# Patient Record
Sex: Female | Born: 1960 | Race: White | Hispanic: No | Marital: Married | Smoking: Never smoker
Health system: Southern US, Community
[De-identification: ages and names within clinical notes are randomized; demographics above are authoritative.]

## PROBLEM LIST (undated history)

## (undated) DIAGNOSIS — F419 Anxiety disorder, unspecified: Secondary | ICD-10-CM

## (undated) DIAGNOSIS — R42 Dizziness and giddiness: Secondary | ICD-10-CM

## (undated) DIAGNOSIS — I10 Essential (primary) hypertension: Secondary | ICD-10-CM

## (undated) DIAGNOSIS — E119 Type 2 diabetes mellitus without complications: Secondary | ICD-10-CM

## (undated) DIAGNOSIS — J45909 Unspecified asthma, uncomplicated: Secondary | ICD-10-CM

## (undated) DIAGNOSIS — D649 Anemia, unspecified: Secondary | ICD-10-CM

## (undated) HISTORY — PX: CHOLECYSTECTOMY: SHX55

## (undated) MED FILL — Iron Sucrose Inj 20 MG/ML (Fe Equiv): INTRAVENOUS | Qty: 15 | Status: AC

---

## 2005-04-18 ENCOUNTER — Emergency Department: Payer: Self-pay | Admitting: Emergency Medicine

## 2006-04-16 ENCOUNTER — Emergency Department: Payer: Self-pay | Admitting: Unknown Physician Specialty

## 2006-11-21 ENCOUNTER — Emergency Department: Payer: Self-pay | Admitting: Emergency Medicine

## 2007-07-10 ENCOUNTER — Emergency Department: Payer: Self-pay | Admitting: Emergency Medicine

## 2007-07-10 ENCOUNTER — Other Ambulatory Visit: Payer: Self-pay

## 2008-07-25 ENCOUNTER — Emergency Department: Payer: Self-pay | Admitting: Emergency Medicine

## 2009-10-31 ENCOUNTER — Emergency Department: Payer: Self-pay | Admitting: Emergency Medicine

## 2011-03-18 ENCOUNTER — Emergency Department: Payer: Self-pay | Admitting: Emergency Medicine

## 2013-08-15 HISTORY — PX: CARPAL TUNNEL RELEASE: SHX101

## 2013-12-27 DIAGNOSIS — G5603 Carpal tunnel syndrome, bilateral upper limbs: Secondary | ICD-10-CM | POA: Insufficient documentation

## 2014-05-30 DIAGNOSIS — E669 Obesity, unspecified: Secondary | ICD-10-CM | POA: Insufficient documentation

## 2014-05-30 DIAGNOSIS — R42 Dizziness and giddiness: Secondary | ICD-10-CM | POA: Insufficient documentation

## 2014-05-30 DIAGNOSIS — E785 Hyperlipidemia, unspecified: Secondary | ICD-10-CM | POA: Insufficient documentation

## 2014-05-30 DIAGNOSIS — E119 Type 2 diabetes mellitus without complications: Secondary | ICD-10-CM | POA: Insufficient documentation

## 2014-05-30 DIAGNOSIS — I10 Essential (primary) hypertension: Secondary | ICD-10-CM | POA: Insufficient documentation

## 2014-06-24 ENCOUNTER — Ambulatory Visit: Payer: Self-pay | Admitting: Orthopedic Surgery

## 2014-09-22 ENCOUNTER — Emergency Department: Payer: Self-pay | Admitting: Student

## 2014-11-19 ENCOUNTER — Emergency Department
Admit: 2014-11-19 | Disposition: A | Discharge status: Home / Self Care | Payer: Self-pay | Admitting: Emergency Medicine

## 2014-11-19 LAB — URINALYSIS, COMPLETE
BILIRUBIN, UR: NEGATIVE
Blood: NEGATIVE
GLUCOSE, UR: NEGATIVE mg/dL (ref 0–75)
Ketone: NEGATIVE
Nitrite: NEGATIVE
Ph: 5 (ref 4.5–8.0)
Protein: 100
Specific Gravity: 1.026 (ref 1.003–1.030)
Squamous Epithelial: 31
WBC UR: 29 /HPF (ref 0–5)

## 2014-11-19 LAB — CBC WITH DIFFERENTIAL/PLATELET
Basophil #: 0 10*3/uL (ref 0.0–0.1)
Basophil %: 0.4 %
EOS PCT: 0.1 %
Eosinophil #: 0 10*3/uL (ref 0.0–0.7)
HCT: 33.3 % — ABNORMAL LOW (ref 35.0–47.0)
HGB: 10.1 g/dL — ABNORMAL LOW (ref 12.0–16.0)
LYMPHS ABS: 1.5 10*3/uL (ref 1.0–3.6)
Lymphocyte %: 14 %
MCH: 22.5 pg — ABNORMAL LOW (ref 26.0–34.0)
MCHC: 30.3 g/dL — ABNORMAL LOW (ref 32.0–36.0)
MCV: 74 fL — ABNORMAL LOW (ref 80–100)
Monocyte #: 0.5 x10 3/mm (ref 0.2–0.9)
Monocyte %: 4.5 %
NEUTROS PCT: 81 %
Neutrophil #: 8.8 10*3/uL — ABNORMAL HIGH (ref 1.4–6.5)
PLATELETS: 506 10*3/uL — AB (ref 150–440)
RBC: 4.5 10*6/uL (ref 3.80–5.20)
RDW: 16.8 % — ABNORMAL HIGH (ref 11.5–14.5)
WBC: 10.9 10*3/uL (ref 3.6–11.0)

## 2014-11-19 LAB — COMPREHENSIVE METABOLIC PANEL
ALK PHOS: 91 U/L
Albumin: 3.9 g/dL
Anion Gap: 8 (ref 7–16)
BUN: 9 mg/dL
Bilirubin,Total: 0.4 mg/dL
Calcium, Total: 9.4 mg/dL
Chloride: 104 mmol/L
Co2: 27 mmol/L
Creatinine: 0.59 mg/dL
EGFR (Non-African Amer.): >60
Glucose: 165 mg/dL — ABNORMAL HIGH
Potassium: 3.8 mmol/L
SGOT(AST): 15 U/L
SGPT (ALT): 16 U/L
Sodium: 139 mmol/L
TOTAL PROTEIN: 8.1 g/dL

## 2014-11-19 LAB — TROPONIN I: Troponin-I: <0.03 ng/mL

## 2014-11-19 LAB — LIPASE, BLOOD: Lipase: 19 U/L — ABNORMAL LOW

## 2014-11-21 LAB — URINE CULTURE

## 2014-12-26 ENCOUNTER — Other Ambulatory Visit: Payer: Self-pay | Admitting: Neurosurgery

## 2014-12-26 DIAGNOSIS — M4300 Spondylolysis, site unspecified: Secondary | ICD-10-CM

## 2015-01-02 ENCOUNTER — Ambulatory Visit
Admission: RE | Admit: 2015-01-02 | Discharge: 2015-01-02 | Disposition: A | Discharge status: Home / Self Care | Payer: 59 | Source: Ambulatory Visit | Attending: Neurosurgery | Admitting: Neurosurgery

## 2015-01-02 DIAGNOSIS — M4300 Spondylolysis, site unspecified: Secondary | ICD-10-CM

## 2015-01-22 ENCOUNTER — Emergency Department
Admission: EM | Admit: 2015-01-22 | Discharge: 2015-01-22 | Disposition: A | Discharge status: Home / Self Care | Payer: 59 | Attending: Emergency Medicine | Admitting: Emergency Medicine

## 2015-01-22 ENCOUNTER — Encounter: Payer: Self-pay | Admitting: Emergency Medicine

## 2015-01-22 DIAGNOSIS — B370 Candidal stomatitis: Secondary | ICD-10-CM | POA: Insufficient documentation

## 2015-01-22 DIAGNOSIS — J029 Acute pharyngitis, unspecified: Secondary | ICD-10-CM | POA: Diagnosis present

## 2015-01-22 MED ORDER — NYSTATIN 100000 UNIT/ML MT SUSP: 5.0000 mL | Freq: Four times a day (QID) | OROMUCOSAL | Status: DC

## 2015-01-22 MED ORDER — FLUCONAZOLE 150 MG PO TABS: 150.0000 mg | ORAL_TABLET | Freq: Every day | ORAL | Status: DC

## 2015-01-22 NOTE — ED Provider Notes (Signed)
[facility] Emergency Department Provider Note  ____________________________________________  Time seen: 12:21 I have reviewed the triage vital signs and the nursing notes.   HISTORY  Chief Complaint Sore Throat   HPI Lauren Velazquez is a 54 y.o. female is here today with complaint of sore throat. She states she does not feel that this is strep throat. She is extremely uncomfortable with eating or breathing. There is been no fever.Patient denies any exposure to strep throat. She did have a steriod injection for her back approximately 2 weeks ago. She states this feels more like thrush. Only her pain is 3 out of 10. Eating or drinking makes this worse. She has not found anything that makes it better.   History reviewed. No pertinent past medical history.  There are no active problems to display for this patient.   No past surgical history on file.  Current Outpatient Rx  Name  Route  Sig  Dispense  Refill  . fluconazole (DIFLUCAN) 150 MG tablet   Oral   Take 1 tablet (150 mg total) by mouth daily.   1 tablet   0   . nystatin (MYCOSTATIN) 100000 UNIT/ML suspension   Oral   Take 5 mLs (500,000 Units total) by mouth 4 (four) times daily.   60 mL   0     Allergies Review of patient's allergies indicates no known allergies.  No family history on file.  Social History History  Substance Use Topics  . Smoking status: Never Smoker   . Smokeless tobacco: Not on file  . Alcohol Use: No    Review of Systems Constitutional: No fever/chills Eyes: No visual changes. ENT: No sore throat. Cardiovascular: Denies chest pain. Respiratory: Denies shortness of breath. Gastrointestinal: No abdominal pain.  No nausea, no vomiting. Genitourinary: Negative for dysuria. Musculoskeletal: Negative for back pain. Skin: Negative for rash. Neurological: Negative for headaches  10-point ROS otherwise  negative.  ____________________________________________   PHYSICAL EXAM:  VITAL SIGNS: ED Triage Vitals  Enc Vitals Group     BP 01/22/15 1154 152/78 mmHg     Pulse Rate 01/22/15 1154 89     Resp 01/22/15 1154 16     Temp 01/22/15 1154 98.8 F (37.1 C)     Temp Source 01/22/15 1154 Oral     SpO2 01/22/15 1154 100 %     Weight 01/22/15 1154 220 lb (99.791 kg)     Height 01/22/15 1154 5\' 3"  (1.6 m)     Head Cir --      Peak Flow --      Pain Score 01/22/15 1155 3     Pain Loc --      Pain Edu? --      Excl. in GC? --     Constitutional: Alert and oriented. Well appearing and in no acute distress. Eyes: Conjunctivae are normal. PERRL. EOMI. Head: Atraumatic. Nose: No congestion/rhinnorhea. Mouth/Throat: Mucous membranes are moist.  Oropharynx non-erythematous. There is diffuse small scattered patches of white exudate along the buccal mucosa and on the tongue. Neck: No stridor.   Hematological/Lymphatic/Immunilogical: No cervical lymphadenopathy. Cardiovascular: Normal rate, regular rhythm. Grossly normal heart sounds.  Good peripheral circulation. Respiratory: Normal respiratory effort.  No retractions. Lungs CTAB. Gastrointestinal: Soft and nontender. No distention. Musculoskeletal: No lower extremity tenderness nor edema.  No joint effusions. Neurologic:  Normal speech and language. No gross focal neurologic deficits are appreciated. Speech is normal. No gait instability. Skin:  Skin is warm, dry and intact. No rash  noted. Psychiatric: Mood and affect are normal. Speech and behavior are normal.  ____________________________________________   LABS (all labs ordered are listed, but only abnormal results are displayed)  Labs Reviewed - No data to display ____________________________________________  PROCEDURES  Procedure(s) performed: None  Critical Care performed: No  ____________________________________________   INITIAL IMPRESSION / ASSESSMENT AND PLAN / ED  COURSE  Pertinent labs & imaging results that were available during my care of the patient were reviewed by me and considered in my medical decision making (see chart for details).  She was given both oral Diflucan and nystatin. She is to follow-up with her doctor if any continued problems. ____________________________________________   FINAL CLINICAL IMPRESSION(S) / ED DIAGNOSES  Final diagnoses:  Oral thrush      Tommi Rumps, PA-C 01/22/15 1549  Phineas Semen, MD 01/26/15 1012

## 2015-01-22 NOTE — ED Notes (Signed)
Says throat sore especially with eating or breathing, scratchy.  No fever

## 2017-05-25 ENCOUNTER — Inpatient Hospital Stay: Admission: RE | Admit: 2017-05-25 | Payer: Self-pay | Source: Ambulatory Visit

## 2017-05-29 ENCOUNTER — Inpatient Hospital Stay: Admission: RE | Admit: 2017-05-29 | Payer: Self-pay | Source: Ambulatory Visit

## 2017-05-29 HISTORY — DX: Anxiety disorder, unspecified: F41.9

## 2017-05-29 HISTORY — DX: Type 2 diabetes mellitus without complications: E11.9

## 2017-05-29 HISTORY — DX: Dizziness and giddiness: R42

## 2017-05-29 HISTORY — DX: Essential (primary) hypertension: I10

## 2017-05-30 ENCOUNTER — Inpatient Hospital Stay: Admission: RE | Admit: 2017-05-30 | Payer: Self-pay | Source: Ambulatory Visit

## 2017-05-31 ENCOUNTER — Inpatient Hospital Stay
Admission: RE | Admit: 2017-05-31 | Discharge: 2017-05-31 | Disposition: A | Discharge status: Home / Self Care | Payer: Self-pay | Source: Ambulatory Visit

## 2017-05-31 MED ORDER — CEFAZOLIN SODIUM-DEXTROSE 2-4 GM/100ML-% IV SOLN
2.0000 g | Freq: Once | INTRAVENOUS | Status: AC
  Administered 2017-06-01: 2 g via INTRAVENOUS

## 2017-05-31 NOTE — Pre-Procedure Instructions (Signed)
CALLED CASEY AT DR [facility] OFFICE AND LEFT HER 2 MESSAGES REGARDING PT NEVER RETURNING MY PHONE CALL FOR HER ANESTHESIA PREOP.  I INFORMED CASEY THAT SHE MAY NEED TO TRY AND GET IN TOUCH WITH PT TO SEE IF SHE IS EVEN GOING TO SHOW UP FOR HER SURGERY.  I TOLD CASEY THAT I HAVE NOT GIVEN PT ANY INSTRUCTIONS  SINCE SHE HAS NEVER RETURNED MY CALL

## 2017-06-01 ENCOUNTER — Ambulatory Visit: Payer: BLUE CROSS/BLUE SHIELD | Admitting: Anesthesiology

## 2017-06-01 ENCOUNTER — Encounter
Admission: RE | Disposition: A | Discharge status: Home / Self Care | Payer: Self-pay | Source: Ambulatory Visit | Attending: Orthopedic Surgery

## 2017-06-01 ENCOUNTER — Ambulatory Visit
Admission: RE | Admit: 2017-06-01 | Discharge: 2017-06-01 | Disposition: A | Discharge status: Home / Self Care | Payer: BLUE CROSS/BLUE SHIELD | Source: Ambulatory Visit | Attending: Orthopedic Surgery | Admitting: Orthopedic Surgery

## 2017-06-01 ENCOUNTER — Encounter: Payer: Self-pay | Admitting: Anesthesiology

## 2017-06-01 DIAGNOSIS — E785 Hyperlipidemia, unspecified: Secondary | ICD-10-CM | POA: Insufficient documentation

## 2017-06-01 DIAGNOSIS — I10 Essential (primary) hypertension: Secondary | ICD-10-CM | POA: Diagnosis not present

## 2017-06-01 DIAGNOSIS — Z79899 Other long term (current) drug therapy: Secondary | ICD-10-CM | POA: Diagnosis not present

## 2017-06-01 DIAGNOSIS — E669 Obesity, unspecified: Secondary | ICD-10-CM | POA: Insufficient documentation

## 2017-06-01 DIAGNOSIS — Z794 Long term (current) use of insulin: Secondary | ICD-10-CM | POA: Insufficient documentation

## 2017-06-01 DIAGNOSIS — Z79891 Long term (current) use of opiate analgesic: Secondary | ICD-10-CM | POA: Diagnosis not present

## 2017-06-01 DIAGNOSIS — E119 Type 2 diabetes mellitus without complications: Secondary | ICD-10-CM | POA: Insufficient documentation

## 2017-06-01 DIAGNOSIS — M9251 Juvenile osteochondrosis of tibia and fibula, right leg: Secondary | ICD-10-CM | POA: Insufficient documentation

## 2017-06-01 DIAGNOSIS — F419 Anxiety disorder, unspecified: Secondary | ICD-10-CM | POA: Diagnosis not present

## 2017-06-01 DIAGNOSIS — M899 Disorder of bone, unspecified: Secondary | ICD-10-CM | POA: Diagnosis present

## 2017-06-01 DIAGNOSIS — Z6838 Body mass index (BMI) 38.0-38.9, adult: Secondary | ICD-10-CM | POA: Insufficient documentation

## 2017-06-01 HISTORY — PX: TIBIAL TUBERCLERPLASTY: SHX6531

## 2017-06-01 HISTORY — DX: Anemia, unspecified: D64.9

## 2017-06-01 LAB — BASIC METABOLIC PANEL
ANION GAP: 10 (ref 5–15)
BUN: 10 mg/dL (ref 6–20)
CO2: 27 mmol/L (ref 22–32)
Calcium: 9.1 mg/dL (ref 8.9–10.3)
Chloride: 103 mmol/L (ref 101–111)
Creatinine, Ser: 0.58 mg/dL (ref 0.44–1.00)
GFR calc Af Amer: >60 mL/min (ref >60)
GFR calc non Af Amer: >60 mL/min (ref >60)
Glucose, Bld: 99 mg/dL (ref 65–99)
POTASSIUM: 3.9 mmol/L (ref 3.5–5.1)
Sodium: 140 mmol/L (ref 135–145)

## 2017-06-01 LAB — GLUCOSE, CAPILLARY
GLUCOSE-CAPILLARY: 103 mg/dL — AB (ref 65–99)
Glucose-Capillary: 88 mg/dL (ref 65–99)

## 2017-06-01 LAB — CBC
HCT: 37.6 % (ref 35.0–47.0)
Hemoglobin: 11.7 g/dL — ABNORMAL LOW (ref 12.0–16.0)
MCH: 23 pg — ABNORMAL LOW (ref 26.0–34.0)
MCHC: 31.1 g/dL — ABNORMAL LOW (ref 32.0–36.0)
MCV: 73.9 fL — ABNORMAL LOW (ref 80.0–100.0)
Platelets: 330 10*3/uL (ref 150–440)
RBC: 5.09 MIL/uL (ref 3.80–5.20)
RDW: 16.9 % — ABNORMAL HIGH (ref 11.5–14.5)
WBC: 9.5 10*3/uL (ref 3.6–11.0)

## 2017-06-01 SURGERY — TIBIAL TUBERCLERPLASTY
Anesthesia: General | Site: Knee | Laterality: Bilateral | Wound class: Clean

## 2017-06-01 MED ORDER — ACETAMINOPHEN 10 MG/ML IV SOLN
INTRAVENOUS | Status: AC
  Filled 2017-06-01: qty 100

## 2017-06-01 MED ORDER — HYDROMORPHONE HCL 1 MG/ML IJ SOLN
0.5000 mg | INTRAMUSCULAR | Status: AC | PRN
  Administered 2017-06-01 (×4): 0.5 mg via INTRAVENOUS

## 2017-06-01 MED ORDER — FENTANYL CITRATE (PF) 100 MCG/2ML IJ SOLN
25.0000 ug | INTRAMUSCULAR | Status: AC | PRN
  Administered 2017-06-01 (×6): 25 ug via INTRAVENOUS

## 2017-06-01 MED ORDER — SODIUM CHLORIDE 0.9 % IV SOLN
INTRAVENOUS | Status: DC
  Administered 2017-06-01: 100 mL/h via INTRAVENOUS

## 2017-06-01 MED ORDER — GLYCOPYRROLATE 0.2 MG/ML IJ SOLN
INTRAMUSCULAR | Status: AC
  Filled 2017-06-01: qty 1

## 2017-06-01 MED ORDER — DEXAMETHASONE SODIUM PHOSPHATE 10 MG/ML IJ SOLN
INTRAMUSCULAR | Status: AC
  Filled 2017-06-01: qty 1

## 2017-06-01 MED ORDER — DEXAMETHASONE SODIUM PHOSPHATE 10 MG/ML IJ SOLN
INTRAMUSCULAR | Status: DC | PRN
  Administered 2017-06-01: 5 mg via INTRAVENOUS

## 2017-06-01 MED ORDER — FENTANYL CITRATE (PF) 100 MCG/2ML IJ SOLN
INTRAMUSCULAR | Status: DC | PRN
  Administered 2017-06-01: 100 ug via INTRAVENOUS

## 2017-06-01 MED ORDER — HYDROCODONE-ACETAMINOPHEN 5-325 MG PO TABS
1.0000 | ORAL_TABLET | Freq: Four times a day (QID) | ORAL | Status: DC | PRN
  Administered 2017-06-01: 1 via ORAL

## 2017-06-01 MED ORDER — ONDANSETRON HCL 4 MG/2ML IJ SOLN: 4.0000 mg | Freq: Once | INTRAMUSCULAR | Status: DC | PRN

## 2017-06-01 MED ORDER — ACETAMINOPHEN 10 MG/ML IV SOLN
INTRAVENOUS | Status: DC | PRN
  Administered 2017-06-01: 1000 mg via INTRAVENOUS

## 2017-06-01 MED ORDER — PROPOFOL 10 MG/ML IV BOLUS
INTRAVENOUS | Status: AC
  Filled 2017-06-01: qty 20

## 2017-06-01 MED ORDER — MIDAZOLAM HCL 2 MG/2ML IJ SOLN
INTRAMUSCULAR | Status: AC
  Filled 2017-06-01: qty 2

## 2017-06-01 MED ORDER — MIDAZOLAM HCL 2 MG/2ML IJ SOLN
INTRAMUSCULAR | Status: DC | PRN
  Administered 2017-06-01: 2 mg via INTRAVENOUS

## 2017-06-01 MED ORDER — KETOROLAC TROMETHAMINE 30 MG/ML IJ SOLN
INTRAMUSCULAR | Status: AC
  Filled 2017-06-01: qty 1

## 2017-06-01 MED ORDER — PROPOFOL 10 MG/ML IV BOLUS
INTRAVENOUS | Status: DC | PRN
  Administered 2017-06-01: 150 mg via INTRAVENOUS

## 2017-06-01 MED ORDER — HYDROMORPHONE HCL 1 MG/ML IJ SOLN
INTRAMUSCULAR | Status: AC
  Administered 2017-06-01: 0.5 mg via INTRAVENOUS
  Filled 2017-06-01: qty 1

## 2017-06-01 MED ORDER — FENTANYL CITRATE (PF) 100 MCG/2ML IJ SOLN
INTRAMUSCULAR | Status: AC
  Administered 2017-06-01: 25 ug via INTRAVENOUS
  Filled 2017-06-01: qty 2

## 2017-06-01 MED ORDER — ONDANSETRON HCL 4 MG/2ML IJ SOLN
INTRAMUSCULAR | Status: AC
  Filled 2017-06-01: qty 2

## 2017-06-01 MED ORDER — PHENYLEPHRINE HCL 10 MG/ML IJ SOLN
INTRAMUSCULAR | Status: DC | PRN
  Administered 2017-06-01 (×3): 100 ug via INTRAVENOUS

## 2017-06-01 MED ORDER — FENTANYL CITRATE (PF) 100 MCG/2ML IJ SOLN
INTRAMUSCULAR | Status: AC
  Filled 2017-06-01: qty 2

## 2017-06-01 MED ORDER — CEFAZOLIN SODIUM-DEXTROSE 2-4 GM/100ML-% IV SOLN
INTRAVENOUS | Status: AC
  Filled 2017-06-01: qty 100

## 2017-06-01 MED ORDER — GLYCOPYRROLATE 0.2 MG/ML IJ SOLN
INTRAMUSCULAR | Status: DC | PRN
  Administered 2017-06-01: 0.2 mg via INTRAVENOUS

## 2017-06-01 MED ORDER — LIDOCAINE HCL (CARDIAC) 20 MG/ML IV SOLN
INTRAVENOUS | Status: DC | PRN
  Administered 2017-06-01: 100 mg via INTRAVENOUS

## 2017-06-01 MED ORDER — FAMOTIDINE 20 MG PO TABS
20.0000 mg | ORAL_TABLET | Freq: Once | ORAL | Status: AC
  Administered 2017-06-01: 20 mg via ORAL

## 2017-06-01 MED ORDER — KETOROLAC TROMETHAMINE 30 MG/ML IJ SOLN
INTRAMUSCULAR | Status: DC | PRN
  Administered 2017-06-01: 30 mg via INTRAVENOUS

## 2017-06-01 MED ORDER — ONDANSETRON HCL 4 MG/2ML IJ SOLN
INTRAMUSCULAR | Status: DC | PRN
Start: 2017-06-01 — End: 2017-06-01
  Administered 2017-06-01: 4 mg via INTRAVENOUS

## 2017-06-01 MED ORDER — HYDROCODONE-ACETAMINOPHEN 5-325 MG PO TABS
ORAL_TABLET | ORAL | Status: AC
  Filled 2017-06-01: qty 1

## 2017-06-01 MED ORDER — FENTANYL CITRATE (PF) 100 MCG/2ML IJ SOLN
25.0000 ug | INTRAMUSCULAR | Status: AC | PRN
  Administered 2017-06-01 (×2): 25 ug via INTRAVENOUS

## 2017-06-01 MED ORDER — HYDROCODONE-ACETAMINOPHEN 5-325 MG PO TABS: 1.0000 | ORAL_TABLET | Freq: Four times a day (QID) | ORAL | 0 refills | Status: DC | PRN

## 2017-06-01 MED ORDER — FAMOTIDINE 20 MG PO TABS
ORAL_TABLET | ORAL | Status: AC
  Administered 2017-06-01: 20 mg via ORAL
  Filled 2017-06-01: qty 1

## 2017-06-01 SURGICAL SUPPLY — 32 items
BANDAGE ACE 6X5 VEL STRL LF (GAUZE/BANDAGES/DRESSINGS) IMPLANT
BLADE INCISOR PLUS 4.5 (BLADE) IMPLANT
BLADE SAW 1/2 (BLADE) ×3 IMPLANT
BLADE SURG 15 STRL LF DISP TIS (BLADE) ×1 IMPLANT
BLADE SURG 15 STRL SS (BLADE) ×2
CHLORAPREP W/TINT 26ML (MISCELLANEOUS) ×3 IMPLANT
COOLER POLAR GLACIER W/PUMP (MISCELLANEOUS) ×3 IMPLANT
CUFF TOURN 24 STER (MISCELLANEOUS) IMPLANT
CUFF TOURN 30 STER DUAL PORT (MISCELLANEOUS) IMPLANT
ELECT CAUTERY BLADE 6.4 (BLADE) ×3 IMPLANT
GAUZE SPONGE 4X4 12PLY STRL (GAUZE/BANDAGES/DRESSINGS) ×3 IMPLANT
GLOVE SURG SYN 9.0  PF PI (GLOVE) ×2
GLOVE SURG SYN 9.0 PF PI (GLOVE) ×1 IMPLANT
GOWN SRG 2XL LVL 4 RGLN SLV (GOWNS) ×1 IMPLANT
GOWN STRL NON-REIN 2XL LVL4 (GOWNS) ×2
GOWN STRL REUS W/ TWL LRG LVL3 (GOWN DISPOSABLE) ×2 IMPLANT
GOWN STRL REUS W/TWL LRG LVL3 (GOWN DISPOSABLE) ×4
IV LACTATED RINGER IRRG 3000ML (IV SOLUTION) ×4
IV LR IRRIG 3000ML ARTHROMATIC (IV SOLUTION) ×2 IMPLANT
KIT RM TURNOVER STRD PROC AR (KITS) ×3 IMPLANT
MANIFOLD NEPTUNE II (INSTRUMENTS) ×3 IMPLANT
PACK EXTREMITY ARMC (MISCELLANEOUS) ×3 IMPLANT
PAD WRAPON POLAR KNEE (MISCELLANEOUS) ×1 IMPLANT
SET TUBE SUCT SHAVER OUTFL 24K (TUBING) ×3 IMPLANT
SET TUBE TIP INTRA-ARTICULAR (MISCELLANEOUS) ×3 IMPLANT
SUT ETHILON 4-0 (SUTURE) ×2
SUT ETHILON 4-0 FS2 18XMFL BLK (SUTURE) ×1
SUT VIC AB 1 CT1 36 (SUTURE) ×3 IMPLANT
SUTURE ETHLN 4-0 FS2 18XMF BLK (SUTURE) ×1 IMPLANT
TUBING ARTHRO INFLOW-ONLY STRL (TUBING) ×3 IMPLANT
WAND COBLATION FLOW 50 (SURGICAL WAND) ×3 IMPLANT
WRAPON POLAR PAD KNEE (MISCELLANEOUS) ×3

## 2017-06-01 NOTE — Anesthesia Preprocedure Evaluation (Addendum)
Anesthesia Evaluation  Patient identified by MRN, date of birth, ID band Patient awake    Reviewed: Allergy & Precautions, NPO status , Patient's Chart, lab work & pertinent test results, reviewed documented beta blocker date and time   Airway Mallampati: III  TM Distance: >3 FB     Dental  (+) Chipped, Upper Dentures, Missing   Pulmonary           Cardiovascular hypertension, Pt. on medications      Neuro/Psych Anxiety    GI/Hepatic   Endo/Other  diabetes, Type 2  Renal/GU      Musculoskeletal   Abdominal   Peds  Hematology   Anesthesia Other Findings Obese.  Reproductive/Obstetrics                            Anesthesia Physical Anesthesia Plan  ASA: III  Anesthesia Plan: General   Post-op Pain Management:    Induction: Intravenous  PONV Risk Score and Plan:   Airway Management Planned: LMA  Additional Equipment:   Intra-op Plan:   Post-operative Plan:   Informed Consent: I have reviewed the patients History and Physical, chart, labs and discussed the procedure including the risks, benefits and alternatives for the proposed anesthesia with the patient or authorized representative who has indicated his/her understanding and acceptance.     Plan Discussed with: CRNA  Anesthesia Plan Comments:         Anesthesia Quick Evaluation

## 2017-06-01 NOTE — Op Note (Signed)
06/01/2017  11:44 AM  PATIENT:  Lauren Velazquez  56 y.o. female  PRE-OPERATIVE DIAGNOSIS:  Bilateral tibial tubercle exostosis  POST-OPERATIVE DIAGNOSIS:  Same   PROCEDURE:  Procedure(s): TIBIAL TUBERCLE SPUR EXCISION (Bilateral)  SURGEON: Leitha Schuller, MD  ASSISTANTS: None  ANESTHESIA:   general  EBL:  Total I/O In: 700 [I.V.:700] Out: 5 [Blood:5]  BLOOD ADMINISTERED:none  DRAINS: none   LOCAL MEDICATIONS USED:  NONE  SPECIMEN:  No Specimen  DISPOSITION OF SPECIMEN:  N/A  COUNTS:  YES  TOURNIQUET:   14 minutes each leg  IMPLANTS: None  DICTATION: .Dragon Dictation patient brought the operating room and after general anesthesia was obtained both legs were prepped draped in sterile fashion. After patient identification and timeout procedures were completed, left tourniquet was raised and the upper thigh to 300 mmHg and a incision was made directly over the anterior tibial spur. The tendon was then elevated off the spur medial and lateral such that the spur could be separated and an osteotome used to remove it. A file was then used to smooth the edges so there is not a sharp edge of bone. The proximal portion was then covered with bone wax to prevent regrowth and minimize bleeding. The tendon was then oversewn over the defect created. This is no 2-0 Vicryl followed by 4-0 nylon for the skin application of sterile dressings of Xeroform 4 x 4 web roll and Ace wrap identical procedures carried out on the right  PLAN OF CARE: Discharge to home after PACU  PATIENT DISPOSITION:  PACU - hemodynamically stable.

## 2017-06-01 NOTE — H&P (Signed)
Reviewed paper H+P, will be scanned into chart. No changes noted.  

## 2017-06-01 NOTE — Anesthesia Post-op Follow-up Note (Signed)
Anesthesia QCDR form completed.        

## 2017-06-01 NOTE — Transfer of Care (Signed)
Immediate Anesthesia Transfer of Care Note  Patient: Lauren Velazquez  Procedure(s) Performed: TIBIAL TUBERCLE SPUR EXCISION (Bilateral Knee)  Patient Location: PACU  Anesthesia Type:General  Level of Consciousness: sedated  Airway & Oxygen Therapy: Patient Spontanous Breathing and Patient connected to face mask oxygen  Post-op Assessment: Report given to RN and Post -op Vital signs reviewed and stable  Post vital signs: Reviewed and stable  Last Vitals:  Vitals:   06/01/17 0928  BP: (!) 156/88  Pulse: 81  Resp: 16  Temp: 36.9 C  SpO2: 97%    Last Pain:  Vitals:   06/01/17 0928  TempSrc: Oral  PainSc: 5       Patients Stated Pain Goal: 1 (06/01/17 5784)  Complications: No apparent anesthesia complications

## 2017-06-01 NOTE — H&P (Signed)
Formatting of this note might be different from the original. Chief Complaint  Patient presents with  . Knee Pain  H&P bil tibial turb spur sx 06/01/17   History of Present Illness: Lauren Velazquez is a 56 y.o. female who presents to the clinic today for history and physical for excision of bilateral knee tibial tuberosities. Patient has had moderate bilateral knee pain for years. She has pain at nighttime that will awaken her. She is unable to kneel. Patient has tried conservative treatment with anti-inflammatory medications as well as physical therapy with no improvement. Pain interferes with her activities of daily living. X-rays show osteophytes along the tibial tubercle which is the location of patient's pain.  Past Medical History: Past Medical History:  Diagnosis Date  . Anxiety attack  . Diabetes mellitus type 2, uncomplicated (CMS-HCC)  . Hyperlipidemia, unspecified  . Hypertension  . Obesity, unspecified  . Vertigo   Past Surgical History: Past Surgical History:  Procedure Laterality Date  . Carpal Tunnel Release Right 07/18/14  Dr. Rosita KeaMenz  . CESAREAN SECTION  two   Past Family History: Family History  Problem Relation Age of Onset  . Diabetes type II Father  . High blood pressure (Hypertension) Father   Medications: Current Outpatient Prescriptions Ordered in Epic  Medication Sig Dispense Refill  . gabapentin (NEURONTIN) 600 MG tablet Take 1 tablet by mouth 3 (three) times daily. 3  . insulin GLARGINE (LANTUS SOLOSTAR) 100 unit/mL pen injector Inject 70 Units subcutaneously 2 (two) times daily.  [name]. lisinopril (PRINIVIL,ZESTRIL) 40 MG tablet Take 1 tablet by mouth once daily. 3  . NOVOLOG FLEXPEN 100 unit/mL pen injector 20 Units 3 (three) times daily. 0  . oxyCODONE (OXYCONTIN) 10 MG CR tablet Take 10 mg by mouth every 12 (twelve) hours.  . traZODone (DESYREL) 150 MG tablet Take 150 mg by mouth nightly.  . venlafaxine (EFFEXOR-XR) 75 MG XR capsule Take 1 capsule by  mouth once daily. 0   No current Epic-ordered facility-administered medications on file.   Allergies: Allergies  Allergen Reactions  . Lipitor [Atorvastatin] Swelling    Body mass index is 38.74 kg/m.  Review of Systems:  A comprehensive 14 point ROS was performed, reviewed, and the pertinent orthopaedic findings are documented in the HPI. Physical Exam: General:  Well developed, well nourished, no apparent distress, normal affect, normal gait with no antalgic component.   HEENT: Head normocephalic, atraumatic, PERRL.   Abdomen: Soft, non tender, non distended, Bowel sounds present.  Heart: Examination of the heart reveals regular, rate, and rhythm. There is no murmur noted on ascultation. There is a normal apical pulse.  Lungs: Lungs are clear to auscultation. There is no wheeze, rhonchi, or crackles. There is normal expansion of bilateral chest walls.  Vitals:  05/26/17 1501  BP: 130/88   Orthopaedic Examination: Right Left   Gait Normal  Alignment Normal  Inspection Normal Normal  Palpation Knee 2 cm x 1.5 cm bony knot at the tibial tendon insertion. Very tender with overlying bursitis. 2 cm x 1.5 cm bony knot at the tibial tendon insertion.  Range of Motion Knee Normal Normal  Strength Normal Normal  Meniscus Exam Normal Normal  Ligament Exam Normal Normal  Patella Exam Normal Normal  Reflexes Normal Normal  Neurologic Normal Normal   Imaging Studies: No new imaging studies were obtained or reviewed today.  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 Risks, benefits, complications of bilateral knee tibial tuberosity excisions were discussed with patient. Patient has agreed and consented to procedure with Dr. Rosita Kea on 06/01/2017.

## 2017-06-01 NOTE — Anesthesia Postprocedure Evaluation (Signed)
Anesthesia Post Note  Patient: Lauren Velazquez  Procedure(s) Performed: TIBIAL TUBERCLE SPUR EXCISION (Bilateral Knee)  Patient location during evaluation: PACU Anesthesia Type: General Level of consciousness: awake and alert Pain management: pain level controlled Vital Signs Assessment: post-procedure vital signs reviewed and stable Respiratory status: spontaneous breathing, nonlabored ventilation, respiratory function stable and patient connected to nasal cannula oxygen Cardiovascular status: blood pressure returned to baseline and stable Postop Assessment: no apparent nausea or vomiting Anesthetic complications: no     Last Vitals:  Vitals:   06/01/17 1312 06/01/17 1325  BP: 130/90 134/68  Pulse: 84 87  Resp: 12   Temp: 36.8 C 36.8 C  SpO2: 93% (!) 86%    Last Pain:  Vitals:   06/01/17 1423  TempSrc:   PainSc: 6                  Shadiamond Koska S

## 2017-06-01 NOTE — Anesthesia Procedure Notes (Signed)
Procedure Name: LMA Insertion Date/Time: 06/01/2017 10:57 AM Performed by: Junious Silk Pre-anesthesia Checklist: Patient identified, Patient being monitored, Timeout performed, Emergency Drugs available and Suction available Patient Re-evaluated:Patient Re-evaluated prior to induction Oxygen Delivery Method: Circle system utilized Preoxygenation: Pre-oxygenation with 100% oxygen Induction Type: IV induction Ventilation: Mask ventilation without difficulty LMA: LMA inserted LMA Size: 3.5 Tube type: Oral Number of attempts: 1 Placement Confirmation: positive ETCO2 and breath sounds checked- equal and bilateral Tube secured with: Tape Dental Injury: Teeth and Oropharynx as per pre-operative assessment

## 2017-06-01 NOTE — Discharge Instructions (Signed)
Keep legs elevated. Aspirin 325 mg daily to help prevent blood clots. Work ankles up and down to help prevent blood clots as well. Leave dressings in place clean and dry. Pain medicine as directed

## 2017-06-04 ENCOUNTER — Emergency Department
Admission: EM | Admit: 2017-06-04 | Discharge: 2017-06-04 | Disposition: A | Discharge status: Home / Self Care | Payer: BLUE CROSS/BLUE SHIELD | Attending: Emergency Medicine | Admitting: Emergency Medicine

## 2017-06-04 DIAGNOSIS — M25562 Pain in left knee: Secondary | ICD-10-CM | POA: Insufficient documentation

## 2017-06-04 DIAGNOSIS — M25561 Pain in right knee: Secondary | ICD-10-CM | POA: Diagnosis present

## 2017-06-04 DIAGNOSIS — Z5321 Procedure and treatment not carried out due to patient leaving prior to being seen by health care provider: Secondary | ICD-10-CM | POA: Insufficient documentation

## 2017-06-04 NOTE — ED Triage Notes (Signed)
Patient reports having surgery on both knees Thursday.  Patient reports swelling and pain to knees.

## 2017-06-05 ENCOUNTER — Telehealth: Payer: Self-pay | Admitting: Emergency Medicine

## 2017-06-05 NOTE — Telephone Encounter (Signed)
Called patient due to lwot to inquire about condition and follow up plans. Left message.   

## 2017-12-19 ENCOUNTER — Inpatient Hospital Stay: Admission: RE | Admit: 2017-12-19 | Payer: BLUE CROSS/BLUE SHIELD | Source: Ambulatory Visit

## 2017-12-22 ENCOUNTER — Encounter
Admission: RE | Admit: 2017-12-22 | Discharge: 2017-12-22 | Disposition: A | Discharge status: Home / Self Care | Payer: BLUE CROSS/BLUE SHIELD | Source: Ambulatory Visit | Attending: Orthopedic Surgery | Admitting: Orthopedic Surgery

## 2017-12-22 ENCOUNTER — Other Ambulatory Visit: Payer: Self-pay

## 2017-12-22 ENCOUNTER — Inpatient Hospital Stay: Admission: RE | Admit: 2017-12-22 | Payer: BLUE CROSS/BLUE SHIELD | Source: Ambulatory Visit

## 2017-12-22 DIAGNOSIS — I10 Essential (primary) hypertension: Secondary | ICD-10-CM | POA: Diagnosis not present

## 2017-12-22 DIAGNOSIS — Z01810 Encounter for preprocedural cardiovascular examination: Secondary | ICD-10-CM | POA: Insufficient documentation

## 2017-12-22 DIAGNOSIS — E119 Type 2 diabetes mellitus without complications: Secondary | ICD-10-CM | POA: Insufficient documentation

## 2017-12-22 DIAGNOSIS — Z01812 Encounter for preprocedural laboratory examination: Secondary | ICD-10-CM | POA: Diagnosis present

## 2017-12-22 HISTORY — DX: Unspecified asthma, uncomplicated: J45.909

## 2017-12-22 LAB — BASIC METABOLIC PANEL
Anion gap: 5 (ref 5–15)
BUN: 10 mg/dL (ref 6–20)
CALCIUM: 8.9 mg/dL (ref 8.9–10.3)
CO2: 29 mmol/L (ref 22–32)
CREATININE: 0.47 mg/dL (ref 0.44–1.00)
Chloride: 104 mmol/L (ref 101–111)
GFR calc Af Amer: >60 mL/min (ref >60)
GFR calc non Af Amer: >60 mL/min (ref >60)
Glucose, Bld: 140 mg/dL — ABNORMAL HIGH (ref 65–99)
Potassium: 3.9 mmol/L (ref 3.5–5.1)
SODIUM: 138 mmol/L (ref 135–145)

## 2017-12-22 NOTE — Patient Instructions (Signed)
Your procedure is scheduled on: Thurs. 12/28/17 Report to Day Surgery. To find out your arrival time please call [phone] between 1PM - 3PM on Wed. 5/15.  Remember: Instructions that are not followed completely may result in serious medical risk, up to and including death, or upon the discretion of your surgeon and anesthesiologist your surgery may need to be rescheduled.     _X__ 1. Do not eat food after midnight the night before your procedure.                 No gum chewing or hard candies. You may drink clear liquids up to 2 hours                 before you are scheduled to arrive for your surgery- DO not drink clear                 liquids within 2 hours of the start of your surgery.                 Clear Liquids include:  water, __X__2.  On the morning of surgery brush your teeth with toothpaste and water, you may rinse your mouth with mouthwash if you wish.  Do not swallow any              toothpaste of mouthwash.     ___ 3.  No Alcohol for 24 hours before or after surgery.   ___ 4.  Do Not Smoke or use e-cigarettes For 24 Hours Prior to Your Surgery.                 Do not use any chewable tobacco products for at least 6 hours prior to                 surgery.  ____  5.  Bring all medications with you on the day of surgery if instructed.   _x___  6.  Notify your doctor if there is any change in your medical condition      (cold, fever, infections).     Do not wear jewelry, make-up, hairpins, clips or nail polish. Do not wear lotions, powders, or perfumes. You may wear deodorant. Do not shave 48 hours prior to surgery. Men may shave face and neck. Do not bring valuables to the hospital.    [facility] is not responsible for any belongings or valuables.  Contacts, dentures or bridgework may not be worn into surgery. Leave your suitcase in the car. After surgery it may be brought to your room. For patients admitted to the hospital, discharge time is  determined by your treatment team.   Patients discharged the day of surgery will not be allowed to drive home.   Please read over the following fact sheets that you were given:    __x__ Take these medicines the morning of surgery with A SIP OF WATER:    1. gabapentin (NEURONTIN) 300 MG capsule  2. oxyCODONE-acetaminophen (PERCOCET) 10-325 MG tablet  3. venlafaxine XR (EFFEXOR-XR) 75 MG 24 hr capsule  4.  5.  6.  ____ Fleet Enema (as directed)   __x__ Use CHG Soap as directed  __x__ Use inhalers on the day of surgery VENTOLIN HFA 108 (90 Base) MCG/ACT inhaler and bring it with you to the hosptial  ____ Stop metformin 2 days prior to surgery    __x__ Take 1/2 of usual insulin dose the night before surgery. No insulin the morning  of surgery.   ____ Stop Coumadin/Plavix/aspirin on   ____ Stop Anti-inflammatories on    ____ Stop supplements until after surgery.    ____ Bring C-Pap to the hospital.

## 2017-12-26 ENCOUNTER — Other Ambulatory Visit: Payer: BLUE CROSS/BLUE SHIELD

## 2017-12-28 ENCOUNTER — Encounter
Admission: RE | Disposition: A | Discharge status: Home / Self Care | Payer: Self-pay | Source: Ambulatory Visit | Attending: Orthopedic Surgery

## 2017-12-28 ENCOUNTER — Ambulatory Visit: Payer: BLUE CROSS/BLUE SHIELD | Admitting: Certified Registered Nurse Anesthetist

## 2017-12-28 ENCOUNTER — Ambulatory Visit
Admission: RE | Admit: 2017-12-28 | Discharge: 2017-12-28 | Disposition: A | Discharge status: Home / Self Care | Payer: BLUE CROSS/BLUE SHIELD | Source: Ambulatory Visit | Attending: Orthopedic Surgery | Admitting: Orthopedic Surgery

## 2017-12-28 DIAGNOSIS — Z79891 Long term (current) use of opiate analgesic: Secondary | ICD-10-CM | POA: Insufficient documentation

## 2017-12-28 DIAGNOSIS — I10 Essential (primary) hypertension: Secondary | ICD-10-CM | POA: Insufficient documentation

## 2017-12-28 DIAGNOSIS — E119 Type 2 diabetes mellitus without complications: Secondary | ICD-10-CM | POA: Diagnosis not present

## 2017-12-28 DIAGNOSIS — M222X1 Patellofemoral disorders, right knee: Secondary | ICD-10-CM | POA: Diagnosis not present

## 2017-12-28 DIAGNOSIS — Z794 Long term (current) use of insulin: Secondary | ICD-10-CM | POA: Insufficient documentation

## 2017-12-28 DIAGNOSIS — Z79899 Other long term (current) drug therapy: Secondary | ICD-10-CM | POA: Diagnosis not present

## 2017-12-28 DIAGNOSIS — F419 Anxiety disorder, unspecified: Secondary | ICD-10-CM | POA: Diagnosis not present

## 2017-12-28 DIAGNOSIS — M25561 Pain in right knee: Secondary | ICD-10-CM | POA: Diagnosis present

## 2017-12-28 HISTORY — PX: KNEE ARTHROSCOPY WITH LATERAL RELEASE: SHX5649

## 2017-12-28 LAB — GLUCOSE, CAPILLARY
GLUCOSE-CAPILLARY: 74 mg/dL (ref 65–99)
Glucose-Capillary: 113 mg/dL — ABNORMAL HIGH (ref 65–99)
Glucose-Capillary: 60 mg/dL — ABNORMAL LOW (ref 65–99)
Glucose-Capillary: 79 mg/dL (ref 65–99)

## 2017-12-28 SURGERY — ARTHROSCOPY, KNEE, WITH LATERAL RETINACULUM RELEASE
Anesthesia: General | Laterality: Right | Wound class: "Clean "

## 2017-12-28 MED ORDER — FAMOTIDINE 20 MG PO TABS: 20.0000 mg | ORAL_TABLET | Freq: Once | ORAL | Status: DC

## 2017-12-28 MED ORDER — ONDANSETRON HCL 4 MG PO TABS: 4.0000 mg | ORAL_TABLET | Freq: Four times a day (QID) | ORAL | Status: DC | PRN

## 2017-12-28 MED ORDER — METOCLOPRAMIDE HCL 5 MG/ML IJ SOLN: 5.0000 mg | Freq: Three times a day (TID) | INTRAMUSCULAR | Status: DC | PRN

## 2017-12-28 MED ORDER — LIDOCAINE HCL (CARDIAC) PF 100 MG/5ML IV SOSY
PREFILLED_SYRINGE | INTRAVENOUS | Status: DC | PRN
  Administered 2017-12-28: 60 mg via INTRAVENOUS

## 2017-12-28 MED ORDER — FENTANYL CITRATE (PF) 100 MCG/2ML IJ SOLN
25.0000 ug | INTRAMUSCULAR | Status: DC | PRN
  Administered 2017-12-28 (×4): 25 ug via INTRAVENOUS

## 2017-12-28 MED ORDER — DEXTROSE 50 % IV SOLN
17.0000 mL | Freq: Once | INTRAVENOUS | Status: AC
  Administered 2017-12-28: 17 mL via INTRAVENOUS

## 2017-12-28 MED ORDER — DEXAMETHASONE SODIUM PHOSPHATE 10 MG/ML IJ SOLN
INTRAMUSCULAR | Status: AC
  Filled 2017-12-28: qty 1

## 2017-12-28 MED ORDER — MIDAZOLAM HCL 2 MG/2ML IJ SOLN
INTRAMUSCULAR | Status: AC
  Filled 2017-12-28: qty 2

## 2017-12-28 MED ORDER — LIDOCAINE HCL (PF) 2 % IJ SOLN
INTRAMUSCULAR | Status: AC
  Filled 2017-12-28: qty 10

## 2017-12-28 MED ORDER — HYDROCODONE-ACETAMINOPHEN 5-325 MG PO TABS
ORAL_TABLET | ORAL | Status: AC
  Filled 2017-12-28: qty 2

## 2017-12-28 MED ORDER — PROPOFOL 10 MG/ML IV BOLUS
INTRAVENOUS | Status: AC
  Filled 2017-12-28: qty 20

## 2017-12-28 MED ORDER — MIDAZOLAM HCL 2 MG/2ML IJ SOLN
INTRAMUSCULAR | Status: DC | PRN
  Administered 2017-12-28: 2 mg via INTRAVENOUS

## 2017-12-28 MED ORDER — DEXAMETHASONE SODIUM PHOSPHATE 10 MG/ML IJ SOLN
INTRAMUSCULAR | Status: DC | PRN
  Administered 2017-12-28: 5 mg via INTRAVENOUS

## 2017-12-28 MED ORDER — BUPIVACAINE-EPINEPHRINE (PF) 0.5% -1:200000 IJ SOLN
INTRAMUSCULAR | Status: AC
  Filled 2017-12-28: qty 30

## 2017-12-28 MED ORDER — METOCLOPRAMIDE HCL 10 MG PO TABS: 5.0000 mg | ORAL_TABLET | Freq: Three times a day (TID) | ORAL | Status: DC | PRN

## 2017-12-28 MED ORDER — BUPIVACAINE-EPINEPHRINE (PF) 0.5% -1:200000 IJ SOLN
INTRAMUSCULAR | Status: DC | PRN
Start: 2017-12-28 — End: 2017-12-28
  Administered 2017-12-28: 30 mL

## 2017-12-28 MED ORDER — ONDANSETRON HCL 4 MG/2ML IJ SOLN
INTRAMUSCULAR | Status: AC
  Filled 2017-12-28: qty 2

## 2017-12-28 MED ORDER — FENTANYL CITRATE (PF) 100 MCG/2ML IJ SOLN
INTRAMUSCULAR | Status: DC | PRN
  Administered 2017-12-28 (×4): 25 ug via INTRAVENOUS

## 2017-12-28 MED ORDER — DEXTROSE 50 % IV SOLN
INTRAVENOUS | Status: AC
  Administered 2017-12-28: 17 mL via INTRAVENOUS
  Filled 2017-12-28: qty 50

## 2017-12-28 MED ORDER — FENTANYL CITRATE (PF) 100 MCG/2ML IJ SOLN
INTRAMUSCULAR | Status: AC
  Administered 2017-12-28: 25 ug via INTRAVENOUS
  Filled 2017-12-28: qty 2

## 2017-12-28 MED ORDER — PROPOFOL 10 MG/ML IV BOLUS
INTRAVENOUS | Status: DC | PRN
  Administered 2017-12-28: 170 mg via INTRAVENOUS

## 2017-12-28 MED ORDER — ONDANSETRON HCL 4 MG/2ML IJ SOLN: 4.0000 mg | Freq: Once | INTRAMUSCULAR | Status: DC | PRN

## 2017-12-28 MED ORDER — FENTANYL CITRATE (PF) 100 MCG/2ML IJ SOLN
INTRAMUSCULAR | Status: AC
  Filled 2017-12-28: qty 2

## 2017-12-28 MED ORDER — SODIUM CHLORIDE 0.9 % IV SOLN: INTRAVENOUS | Status: DC

## 2017-12-28 MED ORDER — ONDANSETRON HCL 4 MG/2ML IJ SOLN: 4.0000 mg | Freq: Four times a day (QID) | INTRAMUSCULAR | Status: DC | PRN

## 2017-12-28 MED ORDER — SODIUM CHLORIDE 0.9 % IV SOLN
INTRAVENOUS | Status: DC
  Administered 2017-12-28: 07:00:00 via INTRAVENOUS

## 2017-12-28 MED ORDER — HYDROCODONE-ACETAMINOPHEN 5-325 MG PO TABS
1.0000 | ORAL_TABLET | ORAL | Status: DC | PRN
  Administered 2017-12-28: 1 via ORAL

## 2017-12-28 MED ORDER — ONDANSETRON HCL 4 MG/2ML IJ SOLN
INTRAMUSCULAR | Status: DC | PRN
  Administered 2017-12-28: 4 mg via INTRAVENOUS

## 2017-12-28 SURGICAL SUPPLY — 27 items
BANDAGE ACE 4X5 VEL STRL LF (GAUZE/BANDAGES/DRESSINGS) IMPLANT
BLADE INCISOR PLUS 4.5 (BLADE) IMPLANT
CHLORAPREP W/TINT 26ML (MISCELLANEOUS) ×3 IMPLANT
CUFF TOURN 24 STER (MISCELLANEOUS) IMPLANT
CUFF TOURN 30 STER DUAL PORT (MISCELLANEOUS) IMPLANT
GAUZE SPONGE 4X4 12PLY STRL (GAUZE/BANDAGES/DRESSINGS) ×3 IMPLANT
GLOVE SURG SYN 9.0  PF PI (GLOVE) ×2
GLOVE SURG SYN 9.0 PF PI (GLOVE) ×1 IMPLANT
GOWN SRG 2XL LVL 4 RGLN SLV (GOWNS) ×1 IMPLANT
GOWN STRL NON-REIN 2XL LVL4 (GOWNS) ×2
GOWN STRL REUS W/ TWL LRG LVL3 (GOWN DISPOSABLE) ×2 IMPLANT
GOWN STRL REUS W/TWL LRG LVL3 (GOWN DISPOSABLE) ×4
IV LACTATED RINGER IRRG 3000ML (IV SOLUTION) ×4
IV LR IRRIG 3000ML ARTHROMATIC (IV SOLUTION) ×2 IMPLANT
KIT TURNOVER KIT A (KITS) ×3 IMPLANT
MANIFOLD NEPTUNE II (INSTRUMENTS) ×3 IMPLANT
NDL HYPO 25X1 1.5 SAFETY (NEEDLE) IMPLANT
NEEDLE HYPO 25X1 1.5 SAFETY (NEEDLE) ×3 IMPLANT
PACK ARTHROSCOPY KNEE (MISCELLANEOUS) ×3 IMPLANT
SCALPEL PROTECTED #11 DISP (BLADE) ×3 IMPLANT
SET TUBE SUCT SHAVER OUTFL 24K (TUBING) ×3 IMPLANT
SET TUBE TIP INTRA-ARTICULAR (MISCELLANEOUS) ×3 IMPLANT
SUT ETHILON 4-0 (SUTURE) ×2
SUT ETHILON 4-0 FS2 18XMFL BLK (SUTURE) ×1
SUTURE ETHLN 4-0 FS2 18XMF BLK (SUTURE) ×1 IMPLANT
TUBING ARTHRO INFLOW-ONLY STRL (TUBING) ×3 IMPLANT
WAND COBLATION FLOW 50 (SURGICAL WAND) ×3 IMPLANT

## 2017-12-28 NOTE — Op Note (Signed)
12/28/2017  8:23 AM  PATIENT:  Lauren Velazquez  57 y.o. female  PRE-OPERATIVE DIAGNOSIS:  patella femoral painsyndrome of right knee  POST-OPERATIVE DIAGNOSIS:  patella femoral painsyndrome of right knee  PROCEDURE:  Procedure(s): KNEE ARTHROSCOPY WITH LATERAL RELEASE (Right)  SURGEON: Leitha Schuller, MD  ASSISTANTS: none  ANESTHESIA:   general  EBL:  Total I/O In: 200 [I.V.:200] Out: -   BLOOD ADMINISTERED:none  DRAINS: none   LOCAL MEDICATIONS USED:  MARCAINE    and XYLOCAINE   SPECIMEN:  No Specimen  DISPOSITION OF SPECIMEN:  N/A  COUNTS:  YES  TOURNIQUET:none  IMPLANTS: none  DICTATION: .Dragon Dictation Patient was brought to the operating room and after adequate general anesthesia was obtained, the right leg was prepped and draped in sterile fashion with a tourniquet applied the upper thigh but not utilized. After appropriate patient identification and timeout procedure were completed, an inferior lateral portal was made and the arthroscope introduced showing significant patellar tilt and a very tight lateral retinaculum with central arthrosis to the patella and a shallow trochlear groove. The gutters were free of any loose bodies coming on the medial compartment inferior medial portal was made medial compartment was relatively normal with intact meniscus anterior cruciate ligament intact in the notch and lateral compartment also was normal in appearance. An ArthroCare wand was then introduced and there was some fat pad impinging up in the patellofemoral joint this was ablated and when this joint was cleared of of any synovial folds or fat pad the lateral release was carried out after lateral release the patella tracked better there appeared to be less pressure along the lateral facet where there was some increased chondromalacia compared to the central aspect. The knee was irrigated until clear and all argentation withdrawn. The portals and area of the lateral release were  injected with half percent Sensorcaine 30 cc and there is a small area and the anterior knee which also had some adherent's scar tissue which was released with a hemostat through small incision from the wounds were then closed with simple 4-0 nylon followed by Xeroform 4 x 4's web roll and Ace wrap  PLAN OF CARE: Discharge to home after PACU  PATIENT DISPOSITION:  PACU - hemodynamically stable.

## 2017-12-28 NOTE — Transfer of Care (Signed)
Immediate Anesthesia Transfer of Care Note  Patient: Lauren Velazquez  Procedure(s) Performed: KNEE ARTHROSCOPY WITH LATERAL RELEASE (Right )  Patient Location: PACU  Anesthesia Type:General  Level of Consciousness: awake, alert , oriented and patient cooperative  Airway & Oxygen Therapy: Patient Spontanous Breathing and Patient connected to nasal cannula oxygen  Post-op Assessment: Report given to RN and Post -op Vital signs reviewed and stable  Post vital signs: Reviewed and stable  Last Vitals:  Vitals Value Taken Time  BP 144/81 12/28/2017  8:08 AM  Temp 36.7 C 12/28/2017  8:08 AM  Pulse 94 12/28/2017  8:09 AM  Resp 18 12/28/2017  8:09 AM  SpO2 100 % 12/28/2017  8:09 AM  Vitals shown include unvalidated device data.  Last Pain:  Vitals:   12/28/17 0623  TempSrc: Oral  PainSc: 0-No pain         Complications: No apparent anesthesia complications

## 2017-12-28 NOTE — Anesthesia Post-op Follow-up Note (Signed)
Anesthesia QCDR form completed.        

## 2017-12-28 NOTE — Anesthesia Procedure Notes (Signed)
Procedure Name: LMA Insertion Date/Time: 12/28/2017 7:26 AM Performed by: Dava Najjar, CRNA Pre-anesthesia Checklist: Patient identified, Emergency Drugs available, Suction available, Patient being monitored and Timeout performed Patient Re-evaluated:Patient Re-evaluated prior to induction Oxygen Delivery Method: Circle system utilized Preoxygenation: Pre-oxygenation with 100% oxygen Induction Type: IV induction LMA: LMA inserted LMA Size: 4.0 Number of attempts: 1 Placement Confirmation: positive ETCO2 and breath sounds checked- equal and bilateral Tube secured with: Tape Dental Injury: Teeth and Oropharynx as per pre-operative assessment

## 2017-12-28 NOTE — Discharge Instructions (Addendum)
Keep dressing clean and dry through weekend.  Try to walk around house, but not much more than that. [facility] ESCRIBED IN TO YOUR PHARMACY ([facility]) BY DR [facility].  AMBULATORY SURGERY  DISCHARGE INSTRUCTIONS   1) The drugs that you were given will stay in your system until tomorrow so for the next 24 hours you should not:  A) Drive an automobile B) Make any legal decisions C) Drink any alcoholic beverage   2) You may resume regular meals tomorrow.  Today it is better to start with liquids and gradually work up to solid foods.  You may eat anything you prefer, but it is better to start with liquids, then soup and crackers, and gradually work up to solid foods.   3) Please notify your doctor immediately if you have any unusual bleeding, trouble breathing, redness and pain at the surgery site, drainage, fever, or pain not relieved by medication.    4) Additional Instructions:        Please contact your physician with any problems or Same Day Surgery at [phone], Monday through Friday 6 am to 4 pm, or [place] at [facility] number at [phone].

## 2017-12-28 NOTE — H&P (Signed)
Reviewed paper H+P, will be scanned into chart. No changes noted.  

## 2017-12-28 NOTE — Anesthesia Preprocedure Evaluation (Signed)
Anesthesia Evaluation  Patient identified by MRN, date of birth, ID band Patient awake    Reviewed: Allergy & Precautions, NPO status , Patient's Chart, lab work & pertinent test results, reviewed documented beta blocker date and time   Airway Mallampati: III  TM Distance: >3 FB     Dental  (+) Chipped, Upper Dentures, Missing   Pulmonary asthma ,           Cardiovascular hypertension, Pt. on medications      Neuro/Psych Anxiety negative neurological ROS     GI/Hepatic negative GI ROS, Neg liver ROS,   Endo/Other  diabetes, Well Controlled, Type 2  Renal/GU negative Renal ROS  negative genitourinary   Musculoskeletal   Abdominal   Peds negative pediatric ROS (+)  Hematology  (+) anemia ,   Anesthesia Other Findings Obese.  Reproductive/Obstetrics                             Anesthesia Physical  Anesthesia Plan  ASA: III  Anesthesia Plan: General   Post-op Pain Management:    Induction: Intravenous  PONV Risk Score and Plan:   Airway Management Planned: LMA  Additional Equipment:   Intra-op Plan:   Post-operative Plan:   Informed Consent: I have reviewed the patients History and Physical, chart, labs and discussed the procedure including the risks, benefits and alternatives for the proposed anesthesia with the patient or authorized representative who has indicated his/her understanding and acceptance.     Plan Discussed with: CRNA  Anesthesia Plan Comments:         Anesthesia Quick Evaluation

## 2018-01-01 NOTE — Anesthesia Postprocedure Evaluation (Signed)
Anesthesia Post Note  Patient: Lauren Velazquez  Procedure(s) Performed: KNEE ARTHROSCOPY WITH LATERAL RELEASE (Right )  Patient location during evaluation: PACU Anesthesia Type: General Level of consciousness: awake and alert and oriented Pain management: pain level controlled Vital Signs Assessment: post-procedure vital signs reviewed and stable Respiratory status: spontaneous breathing Cardiovascular status: blood pressure returned to baseline Anesthetic complications: no     Last Vitals:  Vitals:   12/28/17 0910 12/28/17 0939  BP: (!) 144/68 125/63  Pulse: 94 88  Resp: 18 18  Temp: 36.8 C (!) 36.3 C  SpO2: 93% 94%    Last Pain:  Vitals:   12/28/17 0944  TempSrc:   PainSc: 4                  Aldrich Lloyd

## 2018-05-14 ENCOUNTER — Other Ambulatory Visit: Payer: Self-pay

## 2018-05-14 ENCOUNTER — Encounter
Admission: RE | Admit: 2018-05-14 | Discharge: 2018-05-14 | Disposition: A | Discharge status: Home / Self Care | Payer: BLUE CROSS/BLUE SHIELD | Source: Ambulatory Visit | Attending: Orthopedic Surgery | Admitting: Orthopedic Surgery

## 2018-05-14 DIAGNOSIS — Z01818 Encounter for other preprocedural examination: Secondary | ICD-10-CM | POA: Diagnosis present

## 2018-05-14 DIAGNOSIS — X58XXXD Exposure to other specified factors, subsequent encounter: Secondary | ICD-10-CM | POA: Diagnosis not present

## 2018-05-14 DIAGNOSIS — S83012D Lateral subluxation of left patella, subsequent encounter: Secondary | ICD-10-CM | POA: Diagnosis not present

## 2018-05-14 LAB — CBC
HEMATOCRIT: 33 % — AB (ref 35.0–47.0)
HEMOGLOBIN: 10.6 g/dL — AB (ref 12.0–16.0)
MCH: 23.4 pg — ABNORMAL LOW (ref 26.0–34.0)
MCHC: 32.1 g/dL (ref 32.0–36.0)
MCV: 73 fL — AB (ref 80.0–100.0)
Platelets: 316 10*3/uL (ref 150–440)
RBC: 4.52 MIL/uL (ref 3.80–5.20)
RDW: 17.2 % — ABNORMAL HIGH (ref 11.5–14.5)
WBC: 7.7 10*3/uL (ref 3.6–11.0)

## 2018-05-14 LAB — BASIC METABOLIC PANEL
ANION GAP: 6 (ref 5–15)
BUN: 10 mg/dL (ref 6–20)
CHLORIDE: 107 mmol/L (ref 98–111)
CO2: 28 mmol/L (ref 22–32)
Calcium: 8.9 mg/dL (ref 8.9–10.3)
Creatinine, Ser: 0.5 mg/dL (ref 0.44–1.00)
GFR calc Af Amer: >60 mL/min (ref >60)
GLUCOSE: 78 mg/dL (ref 70–99)
POTASSIUM: 4 mmol/L (ref 3.5–5.1)
Sodium: 141 mmol/L (ref 135–145)

## 2018-05-14 NOTE — Patient Instructions (Signed)
Your procedure is scheduled on: Thursday 05/17/18.  Report to DAY SURGERY DEPARTMENT LOCATED ON 2ND FLOOR MEDICAL MALL ENTRANCE. To find out your arrival time please call [phone] between 1PM - 3PM on Wednesday 05/16/18.  Remember: Instructions that are not followed completely may result in serious medical risk, up to and including death, or upon the discretion of your surgeon and anesthesiologist your surgery may need to be rescheduled.     _X__ 1. Do not eat food after midnight the night before your procedure.                 No gum chewing or hard candies. You may drink SUGAR FREE clear liquids up to 2 hours                 before you are scheduled to arrive for your surgery- DO not drink clear                 liquids within 2 hours of the start of your surgery.                  __X__2.  On the morning of surgery brush your teeth with toothpaste and water, you may rinse your mouth with mouthwash if you wish. Do not swallow any toothpaste or mouthwash.     _X__ 3.  No Alcohol for 24 hours before or after surgery.   _X__ 4.  Do Not Smoke or use e-cigarettes For 24 Hours Prior to Your Surgery.                 Do not use any chewable tobacco products for at least 6 hours prior to                 surgery.  ____  5.  Bring all medications with you on the day of surgery if instructed.   __X__  6.  Notify your doctor if there is any change in your medical condition      (cold, fever, infections).     Do not wear jewelry, make-up, hairpins, clips or nail polish. Do not wear lotions, powders, or perfumes.  Do not shave 48 hours prior to surgery. Men may shave face and neck. Do not bring valuables to the hospital.    [facility] is not responsible for any belongings or valuables.  Contacts, dentures/partials or body piercings may not be worn into surgery. Bring a case for your contacts, glasses or hearing aids, a denture cup will be supplied. Leave your suitcase in the car. After surgery  it may be brought to your room. For patients admitted to the hospital, discharge time is determined by your treatment team.   Patients discharged the day of surgery will not be allowed to drive home.   Please read over the following fact sheets that you were given:   MRSA Information  __X__ Take these medicines the morning of surgery with A SIP OF WATER:     1. gabapentin (NEURONTIN) 300 MG capsule  2. oxyCODONE-acetaminophen (PERCOCET) 10-325 MG tablet IF NEEDED   3. venlafaxine XR (EFFEXOR-XR) 75 MG 24 hr capsule  4. VENTOLIN HFA 108 (90 Base) MCG/ACT inhaler  5.   6.     __X__ Use CHG Soap as directed  _ X___ Use inhalers on the day of surgery. Also bring the inhaler with you to the hospital on the morning of surgery.     __X__ Take 1/2 of usual insulin dose the night  before surgery. No insulin the morning          of surgery.   __X__ Stop Anti-inflammatories 7 days before surgery such as Advil, Ibuprofen, Motrin, BC or Goodies Powder, Naprosyn, Naproxen, Aleve, Aspirin, Meloxicam. May take Tylenol if needed for pain or discomfort.   __X__ Stop all herbal supplements

## 2018-05-16 MED ORDER — CEFAZOLIN SODIUM-DEXTROSE 2-4 GM/100ML-% IV SOLN
2.0000 g | Freq: Once | INTRAVENOUS | Status: AC
  Administered 2018-05-17: 2 g via INTRAVENOUS

## 2018-05-17 ENCOUNTER — Ambulatory Visit: Payer: BLUE CROSS/BLUE SHIELD | Admitting: Certified Registered"

## 2018-05-17 ENCOUNTER — Other Ambulatory Visit: Payer: Self-pay

## 2018-05-17 ENCOUNTER — Encounter
Admission: RE | Disposition: A | Discharge status: Home / Self Care | Payer: Self-pay | Source: Ambulatory Visit | Attending: Orthopedic Surgery

## 2018-05-17 ENCOUNTER — Ambulatory Visit
Admission: RE | Admit: 2018-05-17 | Discharge: 2018-05-17 | Disposition: A | Discharge status: Home / Self Care | Payer: BLUE CROSS/BLUE SHIELD | Source: Ambulatory Visit | Attending: Orthopedic Surgery | Admitting: Orthopedic Surgery

## 2018-05-17 DIAGNOSIS — Z79899 Other long term (current) drug therapy: Secondary | ICD-10-CM | POA: Diagnosis not present

## 2018-05-17 DIAGNOSIS — E119 Type 2 diabetes mellitus without complications: Secondary | ICD-10-CM | POA: Diagnosis not present

## 2018-05-17 DIAGNOSIS — I10 Essential (primary) hypertension: Secondary | ICD-10-CM | POA: Diagnosis not present

## 2018-05-17 DIAGNOSIS — X58XXXA Exposure to other specified factors, initial encounter: Secondary | ICD-10-CM | POA: Insufficient documentation

## 2018-05-17 DIAGNOSIS — F419 Anxiety disorder, unspecified: Secondary | ICD-10-CM | POA: Insufficient documentation

## 2018-05-17 DIAGNOSIS — Z794 Long term (current) use of insulin: Secondary | ICD-10-CM | POA: Insufficient documentation

## 2018-05-17 DIAGNOSIS — S83012A Lateral subluxation of left patella, initial encounter: Secondary | ICD-10-CM | POA: Insufficient documentation

## 2018-05-17 DIAGNOSIS — Z6841 Body Mass Index (BMI) 40.0 and over, adult: Secondary | ICD-10-CM | POA: Insufficient documentation

## 2018-05-17 HISTORY — PX: KNEE ARTHROSCOPY: SHX127

## 2018-05-17 LAB — GLUCOSE, CAPILLARY
GLUCOSE-CAPILLARY: 84 mg/dL (ref 70–99)
Glucose-Capillary: 58 mg/dL — ABNORMAL LOW (ref 70–99)
Glucose-Capillary: 92 mg/dL (ref 70–99)
Glucose-Capillary: 74 mg/dL (ref 70–99)

## 2018-05-17 SURGERY — ARTHROSCOPY, KNEE
Anesthesia: General | Site: Knee | Laterality: Left

## 2018-05-17 MED ORDER — METOCLOPRAMIDE HCL 10 MG PO TABS: 5.0000 mg | ORAL_TABLET | Freq: Three times a day (TID) | ORAL | Status: DC | PRN

## 2018-05-17 MED ORDER — SODIUM CHLORIDE 0.9 % IV SOLN
INTRAVENOUS | Status: DC
  Administered 2018-05-17: 12:00:00 via INTRAVENOUS

## 2018-05-17 MED ORDER — BUPIVACAINE-EPINEPHRINE (PF) 0.5% -1:200000 IJ SOLN
INTRAMUSCULAR | Status: AC
  Filled 2018-05-17: qty 30

## 2018-05-17 MED ORDER — METOCLOPRAMIDE HCL 5 MG/ML IJ SOLN: 5.0000 mg | Freq: Three times a day (TID) | INTRAMUSCULAR | Status: DC | PRN

## 2018-05-17 MED ORDER — OXYCODONE HCL 5 MG/5ML PO SOLN
ORAL | Status: AC
  Administered 2018-05-17: 5 mg
  Filled 2018-05-17: qty 5

## 2018-05-17 MED ORDER — FAMOTIDINE 20 MG PO TABS
ORAL_TABLET | ORAL | Status: AC
  Administered 2018-05-17: 20 mg
  Filled 2018-05-17: qty 1

## 2018-05-17 MED ORDER — FENTANYL CITRATE (PF) 100 MCG/2ML IJ SOLN
INTRAMUSCULAR | Status: DC | PRN
  Administered 2018-05-17 (×3): 50 ug via INTRAVENOUS

## 2018-05-17 MED ORDER — OXYCODONE HCL 5 MG PO TABS
ORAL_TABLET | ORAL | Status: AC
  Administered 2018-05-17: 5 mg via ORAL
  Filled 2018-05-17: qty 1

## 2018-05-17 MED ORDER — BUPIVACAINE-EPINEPHRINE (PF) 0.5% -1:200000 IJ SOLN
INTRAMUSCULAR | Status: DC | PRN
  Administered 2018-05-17: 30 mL

## 2018-05-17 MED ORDER — GLYCOPYRROLATE 0.2 MG/ML IJ SOLN
INTRAMUSCULAR | Status: DC | PRN
  Administered 2018-05-17: 0.2 mg via INTRAVENOUS

## 2018-05-17 MED ORDER — LIDOCAINE HCL (PF) 2 % IJ SOLN
INTRAMUSCULAR | Status: AC
  Filled 2018-05-17: qty 10

## 2018-05-17 MED ORDER — DEXAMETHASONE SODIUM PHOSPHATE 10 MG/ML IJ SOLN
INTRAMUSCULAR | Status: DC | PRN
  Administered 2018-05-17: 5 mg via INTRAVENOUS

## 2018-05-17 MED ORDER — KETOROLAC TROMETHAMINE 30 MG/ML IJ SOLN
INTRAMUSCULAR | Status: AC
  Filled 2018-05-17: qty 1

## 2018-05-17 MED ORDER — MIDAZOLAM HCL 2 MG/2ML IJ SOLN
INTRAMUSCULAR | Status: AC
  Filled 2018-05-17: qty 2

## 2018-05-17 MED ORDER — ONDANSETRON HCL 4 MG PO TABS: 4.0000 mg | ORAL_TABLET | Freq: Four times a day (QID) | ORAL | Status: DC | PRN

## 2018-05-17 MED ORDER — OXYCODONE-ACETAMINOPHEN 5-325 MG PO TABS
ORAL_TABLET | ORAL | Status: AC
  Administered 2018-05-17: 1
  Filled 2018-05-17: qty 1

## 2018-05-17 MED ORDER — ONDANSETRON HCL 4 MG/2ML IJ SOLN
INTRAMUSCULAR | Status: DC | PRN
  Administered 2018-05-17: 4 mg via INTRAVENOUS

## 2018-05-17 MED ORDER — FENTANYL CITRATE (PF) 100 MCG/2ML IJ SOLN
25.0000 ug | INTRAMUSCULAR | Status: AC | PRN
  Administered 2018-05-17 (×2): 25 ug via INTRAVENOUS

## 2018-05-17 MED ORDER — OXYCODONE HCL 5 MG/5ML PO SOLN: 5.0000 mg | Freq: Once | ORAL | Status: DC

## 2018-05-17 MED ORDER — KETOROLAC TROMETHAMINE 30 MG/ML IJ SOLN
INTRAMUSCULAR | Status: DC | PRN
  Administered 2018-05-17: 30 mg via INTRAVENOUS

## 2018-05-17 MED ORDER — FENTANYL CITRATE (PF) 100 MCG/2ML IJ SOLN
INTRAMUSCULAR | Status: AC
  Filled 2018-05-17: qty 2

## 2018-05-17 MED ORDER — FENTANYL CITRATE (PF) 100 MCG/2ML IJ SOLN
25.0000 ug | INTRAMUSCULAR | Status: DC | PRN
  Administered 2018-05-17 (×2): 50 ug via INTRAVENOUS
  Administered 2018-05-17 (×2): 25 ug via INTRAVENOUS

## 2018-05-17 MED ORDER — DEXTROSE 50 % IV SOLN: 25.0000 mL | Freq: Once | INTRAVENOUS | Status: DC

## 2018-05-17 MED ORDER — PROMETHAZINE HCL 25 MG/ML IJ SOLN: 6.2500 mg | INTRAMUSCULAR | Status: DC | PRN

## 2018-05-17 MED ORDER — CEFAZOLIN SODIUM-DEXTROSE 2-4 GM/100ML-% IV SOLN
INTRAVENOUS | Status: AC
  Filled 2018-05-17: qty 100

## 2018-05-17 MED ORDER — PROPOFOL 10 MG/ML IV BOLUS
INTRAVENOUS | Status: AC
  Filled 2018-05-17: qty 20

## 2018-05-17 MED ORDER — SODIUM CHLORIDE 0.9 % IV SOLN: INTRAVENOUS | Status: DC

## 2018-05-17 MED ORDER — OXYCODONE HCL 5 MG PO TABS
5.0000 mg | ORAL_TABLET | ORAL | Status: DC | PRN
  Administered 2018-05-17: 5 mg via ORAL

## 2018-05-17 MED ORDER — FAMOTIDINE 20 MG PO TABS: 20.0000 mg | ORAL_TABLET | Freq: Once | ORAL | Status: DC

## 2018-05-17 MED ORDER — LIDOCAINE HCL (PF) 2 % IJ SOLN
INTRAMUSCULAR | Status: DC | PRN
  Administered 2018-05-17: 50 mg

## 2018-05-17 MED ORDER — FENTANYL CITRATE (PF) 100 MCG/2ML IJ SOLN
INTRAMUSCULAR | Status: AC
  Administered 2018-05-17: 25 ug via INTRAVENOUS
  Filled 2018-05-17: qty 2

## 2018-05-17 MED ORDER — FENTANYL CITRATE (PF) 100 MCG/2ML IJ SOLN
INTRAMUSCULAR | Status: AC
  Administered 2018-05-17: 50 ug via INTRAVENOUS
  Filled 2018-05-17: qty 2

## 2018-05-17 MED ORDER — DEXAMETHASONE SODIUM PHOSPHATE 10 MG/ML IJ SOLN
INTRAMUSCULAR | Status: AC
  Filled 2018-05-17: qty 1

## 2018-05-17 MED ORDER — PROPOFOL 10 MG/ML IV BOLUS
INTRAVENOUS | Status: DC | PRN
  Administered 2018-05-17: 150 mg via INTRAVENOUS
  Administered 2018-05-17: 50 mg via INTRAVENOUS

## 2018-05-17 MED ORDER — GLYCOPYRROLATE 0.2 MG/ML IJ SOLN
INTRAMUSCULAR | Status: AC
  Filled 2018-05-17: qty 1

## 2018-05-17 MED ORDER — MIDAZOLAM HCL 5 MG/5ML IJ SOLN
INTRAMUSCULAR | Status: DC | PRN
  Administered 2018-05-17: 2 mg via INTRAVENOUS

## 2018-05-17 MED ORDER — DEXTROSE 50 % IV SOLN
INTRAVENOUS | Status: AC
  Administered 2018-05-17: 25 mL
  Filled 2018-05-17: qty 50

## 2018-05-17 MED ORDER — ONDANSETRON HCL 4 MG/2ML IJ SOLN: 4.0000 mg | Freq: Four times a day (QID) | INTRAMUSCULAR | Status: DC | PRN

## 2018-05-17 SURGICAL SUPPLY — 26 items
BANDAGE ACE 4X5 VEL STRL LF (GAUZE/BANDAGES/DRESSINGS) IMPLANT
BLADE INCISOR PLUS 4.5 (BLADE) IMPLANT
CHLORAPREP W/TINT 26ML (MISCELLANEOUS) ×3 IMPLANT
CUFF TOURN 24 STER (MISCELLANEOUS) IMPLANT
CUFF TOURN 30 STER DUAL PORT (MISCELLANEOUS) IMPLANT
GAUZE SPONGE 4X4 12PLY STRL (GAUZE/BANDAGES/DRESSINGS) ×3 IMPLANT
GLOVE SURG SYN 9.0  PF PI (GLOVE) ×2
GLOVE SURG SYN 9.0 PF PI (GLOVE) ×1 IMPLANT
GOWN SRG 2XL LVL 4 RGLN SLV (GOWNS) ×1 IMPLANT
GOWN STRL NON-REIN 2XL LVL4 (GOWNS) ×2
GOWN STRL REUS W/ TWL LRG LVL3 (GOWN DISPOSABLE) ×2 IMPLANT
GOWN STRL REUS W/TWL LRG LVL3 (GOWN DISPOSABLE) ×4
IV LACTATED RINGER IRRG 3000ML (IV SOLUTION) ×4
IV LR IRRIG 3000ML ARTHROMATIC (IV SOLUTION) ×2 IMPLANT
KIT TURNOVER KIT A (KITS) ×3 IMPLANT
MANIFOLD NEPTUNE II (INSTRUMENTS) ×3 IMPLANT
NEEDLE HYPO 22GX1.5 SAFETY (NEEDLE) ×3 IMPLANT
PACK ARTHROSCOPY KNEE (MISCELLANEOUS) ×3 IMPLANT
SCALPEL PROTECTED #11 DISP (BLADE) ×3 IMPLANT
SET TUBE SUCT SHAVER OUTFL 24K (TUBING) ×3 IMPLANT
SET TUBE TIP INTRA-ARTICULAR (MISCELLANEOUS) ×3 IMPLANT
SUT ETHILON 4-0 (SUTURE) ×2
SUT ETHILON 4-0 FS2 18XMFL BLK (SUTURE) ×1
SUTURE ETHLN 4-0 FS2 18XMF BLK (SUTURE) ×1 IMPLANT
TUBING ARTHRO INFLOW-ONLY STRL (TUBING) ×3 IMPLANT
WAND COBLATION FLOW 50 (SURGICAL WAND) ×3 IMPLANT

## 2018-05-17 NOTE — Progress Notes (Signed)
Pts blood sugar 92 at this time

## 2018-05-17 NOTE — Transfer of Care (Signed)
Immediate Anesthesia Transfer of Care Note  Patient: Lauren Velazquez  Procedure(s) Performed: ARTHROSCOPY KNEE WITH LATERAL RELEASE, PARTIAL SYNOVECTOMY (Left Knee)  Patient Location: PACU  Anesthesia Type:General  Level of Consciousness: sedated  Airway & Oxygen Therapy: Patient Spontanous Breathing and Patient connected to face mask oxygen  Post-op Assessment: Report given to RN and Post -op Vital signs reviewed and stable  Post vital signs: Reviewed  Last Vitals:  Vitals Value Taken Time  BP 132/85 05/17/2018  5:06 PM  Temp    Pulse 86 05/17/2018  5:06 PM  Resp 11 05/17/2018  5:06 PM  SpO2 98 % 05/17/2018  5:06 PM  Vitals shown include unvalidated device data.  Last Pain:  Vitals:   05/17/18 1400  TempSrc:   PainSc: 8          Complications: No apparent anesthesia complications

## 2018-05-17 NOTE — H&P (Signed)
Reviewed paper H+P, will be scanned into chart. No changes noted.  

## 2018-05-17 NOTE — Anesthesia Postprocedure Evaluation (Signed)
Anesthesia Post Note  Patient: Lauren Velazquez  Procedure(s) Performed: ARTHROSCOPY KNEE WITH LATERAL RELEASE, PARTIAL SYNOVECTOMY (Left Knee)  Patient location during evaluation: PACU Anesthesia Type: General Level of consciousness: awake and alert Pain management: pain level controlled Vital Signs Assessment: post-procedure vital signs reviewed and stable Respiratory status: spontaneous breathing and respiratory function stable Cardiovascular status: stable Anesthetic complications: no     Last Vitals:  Vitals:   05/17/18 1806 05/17/18 1815  BP: (!) 148/82   Pulse: 97 95  Resp: 17 17  Temp:    SpO2: 94% 93%    Last Pain:  Vitals:   05/17/18 1815  TempSrc:   PainSc: 6                  Kionna Brier K

## 2018-05-17 NOTE — Anesthesia Procedure Notes (Signed)
Procedure Name: LMA Insertion Performed by: Yovani Cogburn, CRNA Pre-anesthesia Checklist: Patient identified, Patient being monitored, Timeout performed, Emergency Drugs available and Suction available Patient Re-evaluated:Patient Re-evaluated prior to induction Oxygen Delivery Method: Circle system utilized Preoxygenation: Pre-oxygenation with 100% oxygen Induction Type: IV induction Ventilation: Mask ventilation without difficulty LMA: LMA inserted LMA Size: 3.5 Tube type: Oral Number of attempts: 1 Placement Confirmation: positive ETCO2 and breath sounds checked- equal and bilateral Tube secured with: Tape Dental Injury: Teeth and Oropharynx as per pre-operative assessment        

## 2018-05-17 NOTE — Anesthesia Post-op Follow-up Note (Signed)
Anesthesia QCDR form completed.        

## 2018-05-17 NOTE — Discharge Instructions (Addendum)
AMBULATORY SURGERY  DISCHARGE INSTRUCTIONS   1) The drugs that you were given will stay in your system until tomorrow so for the next 24 hours you should not:  A) Drive an automobile B) Make any legal decisions C) Drink any alcoholic beverage   2) You may resume regular meals tomorrow.  Today it is better to start with liquids and gradually work up to solid foods.  You may eat anything you prefer, but it is better to start with liquids, then soup and crackers, and gradually work up to solid foods.   3) Please notify your doctor immediately if you have any unusual bleeding, trouble breathing, redness and pain at the surgery site, drainage, fever, or pain not relieved by medication.    4) Additional Instructions:        Please contact your physician with any problems or Same Day Surgery at [phone], Monday through Friday 6 am to 4 pm, or [place] [facility] number at [phone].Pain medicine as directed.  Minimize activity through the weekend weightbearing as tolerated on left leg.  Leave bandage in place.

## 2018-05-17 NOTE — Anesthesia Preprocedure Evaluation (Signed)
Anesthesia Evaluation  Patient identified by MRN, date of birth, ID band Patient awake    Reviewed: Allergy & Precautions, NPO status , Patient's Chart, lab work & pertinent test results, reviewed documented beta blocker date and time   History of Anesthesia Complications Negative for: history of anesthetic complications  Airway Mallampati: III  TM Distance: >3 FB     Dental  (+) Chipped, Missing, Poor Dentition, Dental Advidsory Given   Pulmonary neg shortness of breath, asthma , neg sleep apnea, neg recent URI,           Cardiovascular Exercise Tolerance: Good hypertension, Pt. on medications (-) angina(-) CAD, (-) Past MI, (-) Cardiac Stents and (-) CABG (-) dysrhythmias (-) Valvular Problems/Murmurs     Neuro/Psych Anxiety negative neurological ROS     GI/Hepatic negative GI ROS, Neg liver ROS,   Endo/Other  diabetes, Well Controlled, Type obesity  Renal/GU negative Renal ROS  negative genitourinary   Musculoskeletal   Abdominal   Peds negative pediatric ROS (+)  Hematology  (+) anemia ,   Anesthesia Other Findings Past Medical History: No date: Anemia     Comment:  not currently under treatment No date: Anxiety No date: Asthma     Comment:  during allergy season No date: Diabetes mellitus without complication (HCC) No date: Hypertension No date: Vertigo   Reproductive/Obstetrics negative OB ROS                             Anesthesia Physical  Anesthesia Plan  ASA: III  Anesthesia Plan: General   Post-op Pain Management:    Induction: Intravenous  PONV Risk Score and Plan: 3 and Ondansetron, Dexamethasone and Treatment may vary due to age or medical condition  Airway Management Planned: LMA  Additional Equipment:   Intra-op Plan:   Post-operative Plan: Extubation in OR  Informed Consent: I have reviewed the patients History and Physical, chart, labs and  discussed the procedure including the risks, benefits and alternatives for the proposed anesthesia with the patient or authorized representative who has indicated his/her understanding and acceptance.     Plan Discussed with: CRNA  Anesthesia Plan Comments:         Anesthesia Quick Evaluation

## 2018-05-17 NOTE — Op Note (Signed)
05/17/2018  5:09 PM  PATIENT:  Lauren Velazquez  57 y.o. female  PRE-OPERATIVE DIAGNOSIS:  LATERAL SUBLUXATION OF LEFT PATELLA  POST-OPERATIVE DIAGNOSIS:  LATERAL SUBLUXATION OF LEFT PATELLA, PLICA  PROCEDURE:  Procedure(s): ARTHROSCOPY KNEE WITH LATERAL RELEASE, PARTIAL SYNOVECTOMY (Left)  SURGEON: Leitha Schuller, MD  ASSISTANTS: None  ANESTHESIA:   general  EBL:  Total I/O In: 500 [I.V.:500] Out: -   BLOOD ADMINISTERED:none  DRAINS: none   LOCAL MEDICATIONS USED:  MARCAINE     SPECIMEN:  No Specimen  DISPOSITION OF SPECIMEN:  N/A  COUNTS:  YES  TOURNIQUET:  * Missing tourniquet times found for documented tourniquets in log: [id number] *  IMPLANTS: None  DICTATION: .Dragon Dictation patient was brought to the operating room and after general anesthesia was obtained the left leg was prepped and draped in the usual sterile fashion with a tourniquet applied but not required in the upper left thigh.  After patient identification and timeout procedures were completed, an inferolateral portal was made and the arthroscope introduced.  Inspection revealed a tight lateral retinaculum as well as a very thick plica band which covered most of the patellofemoral joint.  Coming out of the medial compartment inferior medial portal was made and articular cartilage was essentially normal with mild synovitis in the anterior compartment which was subsequently ablated ACL was intact and lateral compartment was normal the gutters were free of any loose bodies on further inspection of the patellofemoral joint there was a proximally 1 cm area of the central trochlea that had partial thickness loss of articular cartilage with a matching lesion in the central patella and lateral facet.  Next the plica was debrided with ArthroCare wand removing this plica so there is no longer soft tissue impingement at the patellofemoral joint.  A lateral release was then carried out with a tight lateral retinaculum released  and hemostasis achieved with the device at the same time.  After release the patella was quite mobile and it appeared the subluxation had been it adequately addressed.  The knee was irrigated until clear and all instrumentation withdrawn.  20 cc half percent Sensorcaine with epinephrine infiltrated near the portals and lateral release with the wounds closed with simple interrupted 4-0 nylon skin sutures.  Xeroform 4 x 4 web roll and Ace wrap applied  PLAN OF CARE: Discharge to home after PACU  PATIENT DISPOSITION:  PACU - hemodynamically stable.

## 2018-05-17 NOTE — Progress Notes (Signed)
Pt states she feels shaky, BLood sugar was 58, pt given 1/2 amp D50.per anesthesia

## 2018-05-18 ENCOUNTER — Encounter: Payer: Self-pay | Admitting: Orthopedic Surgery

## 2018-05-24 ENCOUNTER — Ambulatory Visit
Admission: RE | Admit: 2018-05-24 | Discharge: 2018-05-24 | Disposition: A | Discharge status: Home / Self Care | Payer: BLUE CROSS/BLUE SHIELD | Source: Ambulatory Visit | Attending: Orthopedic Surgery | Admitting: Orthopedic Surgery

## 2018-05-24 ENCOUNTER — Other Ambulatory Visit: Payer: Self-pay | Admitting: Orthopedic Surgery

## 2018-05-24 DIAGNOSIS — Z9889 Other specified postprocedural states: Secondary | ICD-10-CM

## 2018-05-25 ENCOUNTER — Ambulatory Visit: Payer: BLUE CROSS/BLUE SHIELD

## 2018-05-25 ENCOUNTER — Ambulatory Visit
Admission: RE | Admit: 2018-05-25 | Discharge: 2018-05-25 | Disposition: A | Discharge status: Home / Self Care | Payer: BLUE CROSS/BLUE SHIELD | Source: Ambulatory Visit | Attending: Orthopedic Surgery | Admitting: Orthopedic Surgery

## 2018-05-25 DIAGNOSIS — Z9889 Other specified postprocedural states: Secondary | ICD-10-CM | POA: Insufficient documentation

## 2018-09-21 ENCOUNTER — Other Ambulatory Visit: Payer: Self-pay | Admitting: Podiatry

## 2018-09-21 ENCOUNTER — Ambulatory Visit (INDEPENDENT_AMBULATORY_CARE_PROVIDER_SITE_OTHER): Payer: BLUE CROSS/BLUE SHIELD

## 2018-09-21 ENCOUNTER — Encounter: Payer: Self-pay | Admitting: Podiatry

## 2018-09-21 ENCOUNTER — Ambulatory Visit (INDEPENDENT_AMBULATORY_CARE_PROVIDER_SITE_OTHER): Payer: BLUE CROSS/BLUE SHIELD | Admitting: Podiatry

## 2018-09-21 ENCOUNTER — Ambulatory Visit: Payer: BLUE CROSS/BLUE SHIELD

## 2018-09-21 VITALS — BP 173/94 | HR 93

## 2018-09-21 DIAGNOSIS — M722 Plantar fascial fibromatosis: Secondary | ICD-10-CM

## 2018-09-21 DIAGNOSIS — M79671 Pain in right foot: Secondary | ICD-10-CM

## 2018-09-21 DIAGNOSIS — R600 Localized edema: Secondary | ICD-10-CM

## 2018-09-21 DIAGNOSIS — M21612 Bunion of left foot: Secondary | ICD-10-CM | POA: Diagnosis not present

## 2018-09-21 MED ORDER — METHYLPREDNISOLONE 4 MG PO TBPK: ORAL_TABLET | ORAL | 0 refills | Status: DC

## 2018-09-23 NOTE — Progress Notes (Signed)
   Subjective: 58 year old female presenting today as a new patient with a chief complaint of pain to the posterior plantar right heel that began about one month ago. Walking and standing increases the pain. She has not done anything for treatment. Patient is here for further evaluation and treatment.   Past Medical History:  Diagnosis Date  . Anemia    not currently under treatment  . Anxiety   . Asthma    during allergy season  . Diabetes mellitus without complication (HCC)   . Hypertension   . Vertigo      Objective: Physical Exam General: The patient is alert and oriented x3 in no acute distress.  Dermatology: Skin is warm, dry and supple bilateral lower extremities. Negative for open lesions or macerations bilateral.   Vascular: Dorsalis Pedis and Posterior Tibial pulses palpable bilateral.  Capillary fill time is immediate to all digits.  Neurological: Epicritic and protective threshold intact bilateral.   Musculoskeletal: Tenderness to palpation to the plantar aspect of the right heel along the plantar fascia. All other joints range of motion within normal limits bilateral. Strength 5/5 in all groups bilateral.   Radiographic exam: Normal osseous mineralization. Joint spaces preserved. No fracture/dislocation/boney destruction. No other soft tissue abnormalities or radiopaque foreign bodies.   Assessment: 1. Plantar fasciitis right  Plan of Care:  1. Patient evaluated. Xrays reviewed.   2. Injection of 0.5cc Celestone soluspan injected into the right plantar fascia  3. Rx for Medrol Dose Pack placed 4. Plantar fascial band(s) dispensed 5. Instructed patient regarding therapies and modalities at home to alleviate symptoms.  6. Appointment with Raiford Noble, Pedorthist, for custom molded orthotics.  7. Return to clinic in 4 weeks.     Felecia Shelling, DPM [facility]  Dr. Felecia Shelling, DPM    [address] [place], [place] [address]                Office [phone]  Fax [phone]

## 2018-10-01 ENCOUNTER — Other Ambulatory Visit: Payer: BLUE CROSS/BLUE SHIELD | Admitting: Orthotics

## 2019-03-31 ENCOUNTER — Emergency Department: Payer: BLUE CROSS/BLUE SHIELD

## 2019-03-31 ENCOUNTER — Encounter: Payer: Self-pay | Admitting: Emergency Medicine

## 2019-03-31 ENCOUNTER — Emergency Department
Admission: EM | Admit: 2019-03-31 | Discharge: 2019-03-31 | Disposition: A | Discharge status: Home / Self Care | Payer: BLUE CROSS/BLUE SHIELD | Attending: Emergency Medicine | Admitting: Emergency Medicine

## 2019-03-31 ENCOUNTER — Other Ambulatory Visit: Payer: Self-pay

## 2019-03-31 DIAGNOSIS — J45909 Unspecified asthma, uncomplicated: Secondary | ICD-10-CM | POA: Diagnosis not present

## 2019-03-31 DIAGNOSIS — M545 Low back pain: Secondary | ICD-10-CM | POA: Diagnosis present

## 2019-03-31 DIAGNOSIS — M5441 Lumbago with sciatica, right side: Secondary | ICD-10-CM | POA: Diagnosis not present

## 2019-03-31 DIAGNOSIS — E119 Type 2 diabetes mellitus without complications: Secondary | ICD-10-CM | POA: Insufficient documentation

## 2019-03-31 DIAGNOSIS — Z79899 Other long term (current) drug therapy: Secondary | ICD-10-CM | POA: Diagnosis not present

## 2019-03-31 DIAGNOSIS — Z794 Long term (current) use of insulin: Secondary | ICD-10-CM | POA: Insufficient documentation

## 2019-03-31 DIAGNOSIS — I10 Essential (primary) hypertension: Secondary | ICD-10-CM | POA: Diagnosis not present

## 2019-03-31 MED ORDER — OXYCODONE-ACETAMINOPHEN 5-325 MG PO TABS
2.0000 | ORAL_TABLET | Freq: Once | ORAL | Status: AC
  Administered 2019-03-31: 05:00:00 2 via ORAL
  Filled 2019-03-31: qty 2

## 2019-03-31 MED ORDER — KETOROLAC TROMETHAMINE 30 MG/ML IJ SOLN
30.0000 mg | Freq: Once | INTRAMUSCULAR | Status: AC
  Administered 2019-03-31: 05:00:00 30 mg via INTRAMUSCULAR
  Filled 2019-03-31: qty 1

## 2019-03-31 MED ORDER — CYCLOBENZAPRINE HCL 5 MG PO TABS: 5.0000 mg | ORAL_TABLET | Freq: Three times a day (TID) | ORAL | 0 refills | Status: DC | PRN

## 2019-03-31 NOTE — Discharge Instructions (Signed)
You have been seen in the Emergency Department (ED)  today for back pain.  Your workup and exam have not shown any acute abnormalities and you are likely suffering from muscle strain or possible problems with your discs, but there is no treatment that will fix your symptoms at this time.  Please take Motrin (ibuprofen) as needed for your pain according to the instructions written on the box.  Alternatively, for the next five days you can take 600mg  three times daily with meals (it may upset your stomach).  Please take the regular pain medicine you are prescribed by Dr. Posey Pronto.  Use the medication I prescribed only as needed and be cautious taking it with your Percocet because the effects can combine and make you very sleepy.  Please follow up with your doctor as soon as possible regarding today's ED visit and your back pain.  Return to the ED for worsening back pain, fever, weakness or numbness of either leg, or if you develop either (1) an inability to urinate or have bowel movements, or (2) loss of your ability to control your bathroom functions (if you start having "accidents"), or if you develop other new symptoms that concern you.

## 2019-03-31 NOTE — ED Provider Notes (Signed)
[facility] Emergency Department Provider Note  ____________________________________________   First MD Initiated Contact with Patient 03/31/19 [phone]     (approximate)  I have reviewed the triage vital signs and the nursing notes.   HISTORY  Chief Complaint Back Pain    HPI Lauren Velazquez is a 58 y.o. female with history of extensive bilateral knee issues as well as chronic back pain for which she sees Dr. Newell CoralNudelman at [facility] for back injections (but she has never had back surgery).  She presents tonight for acute onset low back pain.  She says that she was getting out of bed and tripped over 1 of her cats when she felt a pop in her lower back.  She says that she can sit down or lie down briefly but the pain becomes overwhelming and she has to get up and walk around.  Nothing particular makes it better or worse.  She has taken oxycodone in the past but claims that she has no more oxycodone at home.  She has had no trouble urinating including no retention and no bladder incontinence.  She said that the pain radiates from both sides of her lower back and down her right leg.  No weakness, no difficulty with ambulation in fact she states she has been walking around for the last few hours try to make it better.  No other injuries.  She reports the pain is severe.         Past Medical History:  Diagnosis Date  . Anemia    not currently under treatment  . Anxiety   . Asthma    during allergy season  . Diabetes mellitus without complication (HCC)   . Hypertension   . Vertigo     Patient Active Problem List   Diagnosis Date Noted  . Diabetes mellitus type 2, uncomplicated (HCC) 05/30/2014  . Hyperlipidemia 05/30/2014  . Hypertension 05/30/2014  . Obesity 05/30/2014  . Vertigo 05/30/2014  . Carpal tunnel syndrome on both sides 12/27/2013    Past Surgical History:  Procedure Laterality Date  . CARPAL TUNNEL RELEASE Right 2015  . KNEE ARTHROSCOPY Left  05/17/2018   Procedure: ARTHROSCOPY KNEE WITH LATERAL RELEASE, PARTIAL SYNOVECTOMY;  Surgeon: Kennedy BuckerMenz, Michael, MD;  Location: [facility] ORS;  Service: Orthopedics;  Laterality: Left;  . KNEE ARTHROSCOPY WITH LATERAL RELEASE Right 12/28/2017   Procedure: KNEE ARTHROSCOPY WITH LATERAL RELEASE;  Surgeon: Kennedy BuckerMenz, Michael, MD;  Location: [facility] ORS;  Service: Orthopedics;  Laterality: Right;  . TIBIAL TUBERCLERPLASTY Bilateral 06/01/2017   Procedure: TIBIAL TUBERCLE SPUR EXCISION;  Surgeon: Kennedy BuckerMenz, Michael, MD;  Location: [facility] ORS;  Service: Orthopedics;  Laterality: Bilateral;    Prior to Admission medications   Medication Sig Start Date End Date Taking? Authorizing Provider  cyclobenzaprine (FLEXERIL) 5 MG tablet Take 1 tablet (5 mg total) by mouth 3 (three) times daily as needed for muscle spasms. 03/31/19   Loleta RoseForbach, Berish Bohman, MD  diclofenac sodium (VOLTAREN) 1 % GEL Apply topically. 01/22/18   [provider]  gabapentin (NEURONTIN) 300 MG capsule Take 900 mg by mouth 2 (two) times daily.     [provider]  insulin aspart (NOVOLOG) 100 UNIT/ML injection Inject 20-30 Units into the skin 3 (three) times daily as needed for high blood sugar (> 200).     [provider]  JARDIANCE 10 MG TABS tablet Take 10 mg by mouth daily. 05/07/18   [provider]  LANTUS SOLOSTAR 100 UNIT/ML Solostar Pen Inject 120 Units into  the skin 2 (two) times daily. 04/06/17   [provider]  lisinopril (PRINIVIL,ZESTRIL) 10 MG tablet Take 10 mg by mouth daily.    [provider]  methylPREDNISolone (MEDROL DOSEPAK) 4 MG TBPK tablet 6 day dose pack - take as directed 09/21/18   Felecia ShellingEvans, Brent M, DPM  naproxen (NAPROSYN) 375 MG tablet  05/14/18   [provider]  ondansetron (ZOFRAN-ODT) 4 MG disintegrating tablet  05/18/18   [provider]  oxyCODONE (OXY IR/ROXICODONE) 5 MG immediate release tablet TK 1 T PO Q 4 H PRN P 05/22/18   [provider]   oxyCODONE-acetaminophen (PERCOCET) 10-325 MG tablet Take 1 tablet by mouth every 6 (six) hours as needed for pain.  04/12/17   [provider]  traMADol Janean Sark(ULTRAM) 50 MG tablet  05/19/18   [provider]  traZODone (DESYREL) 100 MG tablet Take 200 mg by mouth at bedtime.     [provider]  venlafaxine XR (EFFEXOR-XR) 75 MG 24 hr capsule Take 75-150 mg by mouth See admin instructions. 75 mg in the morning and 150 mg at night 04/04/17   [provider]  VENTOLIN HFA 108 (90 Base) MCG/ACT inhaler Inhale 2 puffs into the lungs every 6 (six) hours as needed for wheezing or shortness of breath.  04/07/17   [provider]  zaleplon (SONATA) 5 MG capsule TK 1 C PO HS FOR SLP 09/10/18   [provider]    Allergies Atorvastatin  No family history on file.  Social History Social History   Tobacco Use  . Smoking status: Never Smoker  . Smokeless tobacco: Never Used  Substance Use Topics  . Alcohol use: No  . Drug use: No    Review of Systems Constitutional: No fever/chills Cardiovascular: Denies chest pain. Respiratory: Denies shortness of breath. Gastrointestinal: No abdominal pain. Genitourinary: No urinary retention or incontinence. Musculoskeletal: Acute onset lower back pain. Neurological: Negative for headaches, focal weakness or numbness.   ____________________________________________   PHYSICAL EXAM:  VITAL SIGNS: ED Triage Vitals  Enc Vitals Group     BP 03/31/19 0223 (!) 201/87     Pulse Rate 03/31/19 0223 (!) 107     Resp 03/31/19 0223 (!) 22     Temp 03/31/19 0223 98.7 F (37.1 C)     Temp Source 03/31/19 0223 Oral     SpO2 03/31/19 0223 94 %     Weight 03/31/19 0222 104.3 kg (230 lb)     Height 03/31/19 0222 1.575 m (5\' 2" )     Head Circumference --      Peak Flow --      Pain Score 03/31/19 0222 10     Pain Loc --      Pain Edu? --      Excl. in GC? --     Constitutional: Alert and oriented. Appears  uncomfortable. Eyes: Conjunctivae are normal.  Head: Atraumatic. Cardiovascular: Normal rate, regular rhythm. Good peripheral circulation. Respiratory: Normal respiratory effort.  No retractions. Musculoskeletal: Mild tenderness to palpation of bilateral lower back.  No tenderness to palpation of the lumbar spine itself.  No step-offs nor deformities. Neurologic:  Normal speech and language. No gross focal neurologic deficits are appreciated. Ambulatory without difficulty. Skin:  Skin is warm, dry and intact. Psychiatric: Mood and affect are normal. Speech and behavior are normal.  ____________________________________________   LABS (all labs ordered are listed, but only abnormal results are displayed)  Labs Reviewed - No data to display ____________________________________________  EKG  None - EKG not ordered by ED physician ____________________________________________  RADIOLOGY I, Loleta Rose, personally viewed and evaluated these images (plain radiographs) as part of my medical decision making, as well as reviewing the written report by the radiologist.  ED MD interpretation:  No acute abnormalities  Official radiology report(s): Dg Lumbar Spine 2-3 Views  Result Date: 03/31/2019 CLINICAL DATA:  Fall last night. Low back pain. Initial encounter. EXAM: LUMBAR SPINE - 2-3 VIEW COMPARISON:  12/16/2014 FINDINGS: There is no evidence of lumbar spine fracture. Alignment is normal. Mild degenerative disc space narrowing at L4-5. No other significant abnormality identified. IMPRESSION: 1. No acute findings. 2. Mild L4-5 degenerative disc disease. Electronically Signed   By: Danae Orleans M.D.   On: 03/31/2019 04:09    ____________________________________________   PROCEDURES   Procedure(s) performed (including Critical Care):  Procedures   ____________________________________________   INITIAL IMPRESSION / MDM / ASSESSMENT AND PLAN / ED COURSE  As part of my medical  decision making, I reviewed the following data within the electronic MEDICAL RECORD NUMBER Nursing notes reviewed and incorporated, Labs reviewed , Old chart reviewed, Radiograph reviewed , Notes from prior ED visits and [place] Controlled Substance Database   No indication of emergent medical condition such as cauda equina syndrome, epidural abscess, osteomyelitis, transverse myelitis.  The patient had acute onset of pain and a popping sensation which likely represents a lumbar disc issue.  She has no focal neurological deficits.  I will treat her conservatively with medications and she said that she can contact her neurosurgeon on Monday.  Of note, the patient gets monthly prescriptions of Percocet.  She technically should still have some left but she reports that she is out.  I will give her a dose of Percocet tonight but I explained that I cannot give her a prescription for additional narcotics and that she will need to contact her doctor on Monday.  I will treat her with medications as listed below and I gave my usual customary return precautions.           ____________________________________________  FINAL CLINICAL IMPRESSION(S) / ED DIAGNOSES  Final diagnoses:  Acute bilateral low back pain with right-sided sciatica     MEDICATIONS GIVEN DURING THIS VISIT:  Medications  oxyCODONE-acetaminophen (PERCOCET/ROXICET) 5-325 MG per tablet 2 tablet (has no administration in time range)  ketorolac (TORADOL) 30 MG/ML injection 30 mg (has no administration in time range)     ED Discharge Orders         Ordered    cyclobenzaprine (FLEXERIL) 5 MG tablet  3 times daily PRN     03/31/19 0447          *Please note:  Chantell Lawry was evaluated in Emergency Department on 03/31/2019 for the symptoms described in the history of present illness. She was evaluated in the context of the global COVID-19 pandemic, which necessitated consideration that the patient might be at risk for infection with the  SARS-CoV-2 virus that causes COVID-19. Institutional protocols and algorithms that pertain to the evaluation of patients at risk for COVID-19 are in a state of rapid change based on information released by regulatory bodies including the CDC and federal and state organizations. These policies and algorithms were followed during the patient's care in the ED.  Some ED evaluations and interventions may be delayed as a result of limited staffing during the pandemic.*  Note:  This document was prepared using Dragon voice recognition software and may include unintentional dictation errors.  Hinda Kehr, MD 03/31/19 [phone]

## 2019-03-31 NOTE — ED Notes (Signed)
Pt prefers to wait outside for room as she states that she cannot be separated from her spouse.

## 2019-03-31 NOTE — ED Notes (Signed)
E-signature not working at this time. Pt verbalizes understanding of D/C instructions. Pt in NAD at time of D/C. Pt ambulatory to lobby.

## 2019-03-31 NOTE — ED Triage Notes (Signed)
Pt arrives POV and ambulatory to triage with c/o back pain from a fall that occurred around 2200 last night. Pt denies LOC or head trauma. Pt states that she cannot sit down or lay down. Pt is in NAD.

## 2019-04-27 ENCOUNTER — Other Ambulatory Visit: Payer: Self-pay

## 2019-04-27 ENCOUNTER — Emergency Department
Admission: EM | Admit: 2019-04-27 | Discharge: 2019-04-27 | Disposition: A | Discharge status: Home / Self Care | Payer: BLUE CROSS/BLUE SHIELD | Attending: Emergency Medicine | Admitting: Emergency Medicine

## 2019-04-27 ENCOUNTER — Encounter: Payer: Self-pay | Admitting: Emergency Medicine

## 2019-04-27 DIAGNOSIS — Z79899 Other long term (current) drug therapy: Secondary | ICD-10-CM | POA: Insufficient documentation

## 2019-04-27 DIAGNOSIS — J45909 Unspecified asthma, uncomplicated: Secondary | ICD-10-CM | POA: Diagnosis not present

## 2019-04-27 DIAGNOSIS — E119 Type 2 diabetes mellitus without complications: Secondary | ICD-10-CM | POA: Diagnosis not present

## 2019-04-27 DIAGNOSIS — I10 Essential (primary) hypertension: Secondary | ICD-10-CM | POA: Insufficient documentation

## 2019-04-27 DIAGNOSIS — M545 Low back pain: Secondary | ICD-10-CM | POA: Diagnosis not present

## 2019-04-27 DIAGNOSIS — Z794 Long term (current) use of insulin: Secondary | ICD-10-CM | POA: Insufficient documentation

## 2019-04-27 DIAGNOSIS — G8929 Other chronic pain: Secondary | ICD-10-CM | POA: Diagnosis not present

## 2019-04-27 MED ORDER — ONDANSETRON 4 MG PO TBDP
4.0000 mg | ORAL_TABLET | Freq: Once | ORAL | Status: AC
  Administered 2019-04-27: 4 mg via ORAL
  Filled 2019-04-27: qty 1

## 2019-04-27 MED ORDER — PREDNISONE 10 MG (21) PO TBPK: ORAL_TABLET | ORAL | 0 refills | Status: DC

## 2019-04-27 MED ORDER — OXYCODONE-ACETAMINOPHEN 5-325 MG PO TABS
2.0000 | ORAL_TABLET | Freq: Once | ORAL | Status: AC
  Administered 2019-04-27: 19:00:00 2 via ORAL
  Filled 2019-04-27: qty 2

## 2019-04-27 NOTE — ED Provider Notes (Signed)
[facility] Emergency Department Provider Note  ____________________________________________  Time seen: Approximately 7:55 PM  I have reviewed the triage vital signs and the nursing notes.   HISTORY  Chief Complaint Back Pain    HPI Lauren Velazquez is a 58 y.o. female presents to the emergency department for a refill of her oxycodone tens.  Patient is currently under the care of pain management for chronic low back pain.  She reports that her oxycodone will be refilled on Wednesday and would like a prescription until her refill can occur.  Patient states that she has been shaking, sweating and vomiting at home as she ran out of her oxycodone yesterday.        Past Medical History:  Diagnosis Date  . Anemia    not currently under treatment  . Anxiety   . Asthma    during allergy season  . Diabetes mellitus without complication ([place])   . Hypertension   . Vertigo     Patient Active Problem List   Diagnosis Date Noted  . Diabetes mellitus type 2, uncomplicated ([place]) 25/36/6440  . Hyperlipidemia 05/30/2014  . Hypertension 05/30/2014  . Obesity 05/30/2014  . Vertigo 05/30/2014  . Carpal tunnel syndrome on both sides 12/27/2013    Past Surgical History:  Procedure Laterality Date  . CARPAL TUNNEL RELEASE Right 2015  . KNEE ARTHROSCOPY Left 05/17/2018   Procedure: ARTHROSCOPY KNEE WITH LATERAL RELEASE, PARTIAL SYNOVECTOMY;  Surgeon: Hessie Knows, MD;  Location: [facility] ORS;  Service: Orthopedics;  Laterality: Left;  . KNEE ARTHROSCOPY WITH LATERAL RELEASE Right 12/28/2017   Procedure: KNEE ARTHROSCOPY WITH LATERAL RELEASE;  Surgeon: Hessie Knows, MD;  Location: [facility] ORS;  Service: Orthopedics;  Laterality: Right;  . TIBIAL TUBERCLERPLASTY Bilateral 06/01/2017   Procedure: TIBIAL TUBERCLE SPUR EXCISION;  Surgeon: Hessie Knows, MD;  Location: [facility] ORS;  Service: Orthopedics;  Laterality: Bilateral;    Prior to Admission medications   Medication Sig  Start Date End Date Taking? Authorizing Provider  cyclobenzaprine (FLEXERIL) 5 MG tablet Take 1 tablet (5 mg total) by mouth 3 (three) times daily as needed for muscle spasms. 03/31/19   Hinda Kehr, MD  diclofenac sodium (VOLTAREN) 1 % GEL Apply topically. 01/22/18   [provider]  gabapentin (NEURONTIN) 300 MG capsule Take 900 mg by mouth 2 (two) times daily.     [provider]  insulin aspart (NOVOLOG) 100 UNIT/ML injection Inject 20-30 Units into the skin 3 (three) times daily as needed for high blood sugar (> 200).     [provider]  JARDIANCE 10 MG TABS tablet Take 10 mg by mouth daily. 05/07/18   [provider]  LANTUS SOLOSTAR 100 UNIT/ML Solostar Pen Inject 120 Units into the skin 2 (two) times daily. 04/06/17   [provider]  lisinopril (PRINIVIL,ZESTRIL) 10 MG tablet Take 10 mg by mouth daily.    [provider]  methylPREDNISolone (MEDROL DOSEPAK) 4 MG TBPK tablet 6 day dose pack - take as directed 09/21/18   Edrick Kins, DPM  naproxen (NAPROSYN) 375 MG tablet  05/14/18   [provider]  ondansetron (ZOFRAN-ODT) 4 MG disintegrating tablet  05/18/18   [provider]  oxyCODONE (OXY IR/ROXICODONE) 5 MG immediate release tablet TK 1 T PO Q 4 H PRN P 05/22/18   [provider]  oxyCODONE-acetaminophen (PERCOCET) 10-325 MG tablet Take 1 tablet by mouth every 6 (six) hours as needed for pain.  04/12/17   [provider]  predniSONE (  STERAPRED UNI-PAK 21 TAB) 10 MG (21) TBPK tablet Take 6 tablets the first day, take 5 tablets the second day, take 4 tablets the third day, take 3 tablets the fourth day, take 2 tablets the fifth day, take 1 tablet the sixth day. 04/27/19   Orvil Feil, PA-C  traMADol Janean Sark) 50 MG tablet  05/19/18   [provider]  traZODone (DESYREL) 100 MG tablet Take 200 mg by mouth at bedtime.     [provider]  venlafaxine XR (EFFEXOR-XR) 75 MG 24 hr capsule  Take 75-150 mg by mouth See admin instructions. 75 mg in the morning and 150 mg at night 04/04/17   [provider]  VENTOLIN HFA 108 (90 Base) MCG/ACT inhaler Inhale 2 puffs into the lungs every 6 (six) hours as needed for wheezing or shortness of breath.  04/07/17   [provider]  zaleplon (SONATA) 5 MG capsule TK 1 C PO HS FOR SLP 09/10/18   [provider]    Allergies Atorvastatin  No family history on file.  Social History Social History   Tobacco Use  . Smoking status: Never Smoker  . Smokeless tobacco: Never Used  Substance Use Topics  . Alcohol use: No  . Drug use: No     Review of Systems  Constitutional: No fever/chills Eyes: No visual changes. No discharge ENT: No upper respiratory complaints. Cardiovascular: no chest pain. Respiratory: no cough. No SOB. Gastrointestinal: No abdominal pain.  No nausea, no vomiting.  No diarrhea.  No constipation. Genitourinary: Negative for dysuria. No hematuria Musculoskeletal: Patient has low back pain.  Skin: Negative for rash, abrasions, lacerations, ecchymosis. Neurological: Negative for headaches, focal weakness or numbness.   ____________________________________________   PHYSICAL EXAM:  VITAL SIGNS: ED Triage Vitals  Enc Vitals Group     BP 04/27/19 1749 (!) 208/83     Pulse Rate 04/27/19 1749 100     Resp 04/27/19 1749 18     Temp 04/27/19 1749 98.2 F (36.8 C)     Temp Source 04/27/19 1749 Oral     SpO2 04/27/19 1749 95 %     Weight --      Height --      Head Circumference --      Peak Flow --      Pain Score 04/27/19 1759 8     Pain Loc --      Pain Edu? --      Excl. in GC? --      Constitutional: Alert and oriented. Well appearing and in no acute distress. Eyes: Conjunctivae are normal. PERRL. EOMI. Head: Atraumatic. ENT: Cardiovascular: Normal rate, regular rhythm. Normal S1 and S2.  Good peripheral circulation. Respiratory: Normal respiratory effort without  tachypnea or retractions. Lungs CTAB. Good air entry to the bases with no decreased or absent breath sounds. Gastrointestinal: Bowel sounds 4 quadrants. Soft and nontender to palpation. No guarding or rigidity. No palpable masses. No distention. No CVA tenderness. Musculoskeletal: Full range of motion to all extremities. No gross deformities appreciated. Neurologic:  Normal speech and language. No gross focal neurologic deficits are appreciated.  Skin:  Skin is warm, dry and intact. No rash noted. Psychiatric: Mood and affect are normal. Speech and behavior are normal. Patient exhibits appropriate insight and judgement.   ____________________________________________   LABS (all labs ordered are listed, but only abnormal results are displayed)  Labs Reviewed - No data to display ____________________________________________  EKG   ____________________________________________  RADIOLOGY   No  results found.  ____________________________________________    PROCEDURES  Procedure(s) performed:    Procedures    Medications  oxyCODONE-acetaminophen (PERCOCET/ROXICET) 5-325 MG per tablet 2 tablet (2 tablets Oral Given 04/27/19 1841)  ondansetron (ZOFRAN-ODT) disintegrating tablet 4 mg (4 mg Oral Given 04/27/19 1841)     ____________________________________________   INITIAL IMPRESSION / ASSESSMENT AND PLAN / ED COURSE  Pertinent labs & imaging results that were available during my care of the patient were reviewed by me and considered in my medical decision making (see chart for details).  Review of the [place] CSRS was performed in accordance of the [facility] prior to dispensing any controlled drugs.           Assessment and plan Low back pain 58 year old female presents to the emergency department with chronic low back pain and a request for a refill of her oxycodone.  I explained to patient that we cannot refill her oxycodone prescription in the emergency department.   Patient received a 30-day supply of oxycodone on 04/01/2019.  Patient was given one dose of oxycodone in the emergency department and was discharged with prednisone.  All patient questions were answered.   ____________________________________________  FINAL CLINICAL IMPRESSION(S) / ED DIAGNOSES  Final diagnoses:  Chronic bilateral low back pain without sciatica      NEW MEDICATIONS STARTED DURING THIS VISIT:  ED Discharge Orders         Ordered    predniSONE (STERAPRED UNI-PAK 21 TAB) 10 MG (21) TBPK tablet     04/27/19 1823              This chart was dictated using voice recognition software/Dragon. Despite best efforts to proofread, errors can occur which can change the meaning. Any change was purely unintentional.    Gasper LloydWoods, Caesar Mannella M, PA-C 04/27/19 2011    Sharyn CreamerQuale, Mark, MD 04/28/19 [phone]

## 2019-04-27 NOTE — ED Notes (Signed)
Pt c/o worsening chronic back pain. Pt states she takes oxycodone which is rx'd by her PCP. Pt has been not taking the medication as prescribed, she has been taking more. Pt states she cannot get more medication until this coming Wednesday. Pt ran out of meds yesterday. Pt is unable to sit down or lie down due to increased pain.

## 2019-04-27 NOTE — ED Triage Notes (Signed)
Pt arrived via POV with reports of bilateral back pain, pt states the pain is worsened when she tries to sit.  Pt states she is treated for her back pain by PCP. Pt takes oxycodone at home, last dose was yesterday. Pt states she ran out yesterday.

## 2020-01-08 ENCOUNTER — Other Ambulatory Visit: Payer: Self-pay | Admitting: Family Medicine

## 2020-01-08 DIAGNOSIS — M5416 Radiculopathy, lumbar region: Secondary | ICD-10-CM

## 2020-01-08 DIAGNOSIS — M5137 Other intervertebral disc degeneration, lumbosacral region: Secondary | ICD-10-CM

## 2020-01-17 ENCOUNTER — Other Ambulatory Visit (HOSPITAL_COMMUNITY): Payer: Self-pay | Admitting: Family Medicine

## 2020-01-17 DIAGNOSIS — M5137 Other intervertebral disc degeneration, lumbosacral region: Secondary | ICD-10-CM

## 2020-01-17 DIAGNOSIS — M5416 Radiculopathy, lumbar region: Secondary | ICD-10-CM

## 2020-01-30 ENCOUNTER — Other Ambulatory Visit: Payer: Self-pay | Admitting: Family Medicine

## 2020-01-30 DIAGNOSIS — M5137 Other intervertebral disc degeneration, lumbosacral region: Secondary | ICD-10-CM

## 2020-01-30 DIAGNOSIS — M5416 Radiculopathy, lumbar region: Secondary | ICD-10-CM

## 2020-02-03 ENCOUNTER — Ambulatory Visit (HOSPITAL_COMMUNITY): Admission: RE | Admit: 2020-02-03 | Payer: BLUE CROSS/BLUE SHIELD | Source: Ambulatory Visit

## 2020-02-14 ENCOUNTER — Ambulatory Visit: Payer: BLUE CROSS/BLUE SHIELD

## 2020-03-05 ENCOUNTER — Encounter: Payer: Self-pay | Admitting: Emergency Medicine

## 2020-03-05 ENCOUNTER — Inpatient Hospital Stay
Admission: EM | Admit: 2020-03-05 | Discharge: 2020-03-10 | DRG: 193 | Disposition: A | Discharge status: Home / Self Care | Payer: BLUE CROSS/BLUE SHIELD | Attending: Hospitalist | Admitting: Hospitalist

## 2020-03-05 ENCOUNTER — Emergency Department: Payer: BLUE CROSS/BLUE SHIELD

## 2020-03-05 ENCOUNTER — Other Ambulatory Visit: Payer: Self-pay

## 2020-03-05 DIAGNOSIS — Z79891 Long term (current) use of opiate analgesic: Secondary | ICD-10-CM | POA: Diagnosis not present

## 2020-03-05 DIAGNOSIS — Z791 Long term (current) use of non-steroidal anti-inflammatories (NSAID): Secondary | ICD-10-CM | POA: Diagnosis not present

## 2020-03-05 DIAGNOSIS — Z6841 Body Mass Index (BMI) 40.0 and over, adult: Secondary | ICD-10-CM

## 2020-03-05 DIAGNOSIS — R109 Unspecified abdominal pain: Secondary | ICD-10-CM | POA: Diagnosis present

## 2020-03-05 DIAGNOSIS — I10 Essential (primary) hypertension: Secondary | ICD-10-CM | POA: Diagnosis present

## 2020-03-05 DIAGNOSIS — Z833 Family history of diabetes mellitus: Secondary | ICD-10-CM | POA: Diagnosis not present

## 2020-03-05 DIAGNOSIS — J440 Chronic obstructive pulmonary disease with acute lower respiratory infection: Secondary | ICD-10-CM | POA: Diagnosis present

## 2020-03-05 DIAGNOSIS — R197 Diarrhea, unspecified: Secondary | ICD-10-CM | POA: Diagnosis present

## 2020-03-05 DIAGNOSIS — G8929 Other chronic pain: Secondary | ICD-10-CM | POA: Diagnosis present

## 2020-03-05 DIAGNOSIS — E669 Obesity, unspecified: Secondary | ICD-10-CM | POA: Diagnosis present

## 2020-03-05 DIAGNOSIS — E119 Type 2 diabetes mellitus without complications: Secondary | ICD-10-CM | POA: Diagnosis present

## 2020-03-05 DIAGNOSIS — Z20822 Contact with and (suspected) exposure to covid-19: Secondary | ICD-10-CM | POA: Diagnosis present

## 2020-03-05 DIAGNOSIS — R778 Other specified abnormalities of plasma proteins: Secondary | ICD-10-CM | POA: Diagnosis not present

## 2020-03-05 DIAGNOSIS — J9601 Acute respiratory failure with hypoxia: Secondary | ICD-10-CM | POA: Diagnosis present

## 2020-03-05 DIAGNOSIS — Z79899 Other long term (current) drug therapy: Secondary | ICD-10-CM | POA: Diagnosis not present

## 2020-03-05 DIAGNOSIS — J159 Unspecified bacterial pneumonia: Principal | ICD-10-CM | POA: Diagnosis present

## 2020-03-05 DIAGNOSIS — Z794 Long term (current) use of insulin: Secondary | ICD-10-CM | POA: Diagnosis not present

## 2020-03-05 DIAGNOSIS — E785 Hyperlipidemia, unspecified: Secondary | ICD-10-CM | POA: Diagnosis present

## 2020-03-05 DIAGNOSIS — R112 Nausea with vomiting, unspecified: Secondary | ICD-10-CM | POA: Diagnosis present

## 2020-03-05 DIAGNOSIS — I248 Other forms of acute ischemic heart disease: Secondary | ICD-10-CM | POA: Diagnosis present

## 2020-03-05 DIAGNOSIS — G4700 Insomnia, unspecified: Secondary | ICD-10-CM | POA: Diagnosis present

## 2020-03-05 DIAGNOSIS — F419 Anxiety disorder, unspecified: Secondary | ICD-10-CM | POA: Diagnosis present

## 2020-03-05 DIAGNOSIS — F329 Major depressive disorder, single episode, unspecified: Secondary | ICD-10-CM | POA: Diagnosis present

## 2020-03-05 LAB — CBC WITH DIFFERENTIAL/PLATELET
Abs Immature Granulocytes: 0.11 10*3/uL — ABNORMAL HIGH (ref 0.00–0.07)
Basophils Absolute: 0.1 10*3/uL (ref 0.0–0.1)
Basophils Relative: 1 %
Eosinophils Absolute: 0.1 10*3/uL (ref 0.0–0.5)
Eosinophils Relative: 1 %
HCT: 38.9 % (ref 36.0–46.0)
Hemoglobin: 10.5 g/dL — ABNORMAL LOW (ref 12.0–15.0)
Immature Granulocytes: 1 %
Lymphocytes Relative: 13 %
Lymphs Abs: 2 10*3/uL (ref 0.7–4.0)
MCH: 19.8 pg — ABNORMAL LOW (ref 26.0–34.0)
MCHC: 27 g/dL — ABNORMAL LOW (ref 30.0–36.0)
MCV: 73.4 fL — ABNORMAL LOW (ref 80.0–100.0)
Monocytes Absolute: 0.9 10*3/uL (ref 0.1–1.0)
Monocytes Relative: 6 %
Neutro Abs: 12.2 10*3/uL — ABNORMAL HIGH (ref 1.7–7.7)
Neutrophils Relative %: 78 %
Platelets: 456 10*3/uL — ABNORMAL HIGH (ref 150–400)
RBC: 5.3 MIL/uL — ABNORMAL HIGH (ref 3.87–5.11)
RDW: 18.9 % — ABNORMAL HIGH (ref 11.5–15.5)
WBC: 15.4 10*3/uL — ABNORMAL HIGH (ref 4.0–10.5)
nRBC: 0.8 % — ABNORMAL HIGH (ref 0.0–0.2)

## 2020-03-05 LAB — COMPREHENSIVE METABOLIC PANEL
ALT: 10 U/L (ref 0–44)
AST: 11 U/L — ABNORMAL LOW (ref 15–41)
Albumin: 3.5 g/dL (ref 3.5–5.0)
Alkaline Phosphatase: 91 U/L (ref 38–126)
Anion gap: 12 (ref 5–15)
BUN: 8 mg/dL (ref 6–20)
CO2: 29 mmol/L (ref 22–32)
Calcium: 8.7 mg/dL — ABNORMAL LOW (ref 8.9–10.3)
Chloride: 97 mmol/L — ABNORMAL LOW (ref 98–111)
Creatinine, Ser: 0.71 mg/dL (ref 0.44–1.00)
GFR calc Af Amer: >60 mL/min (ref >60)
GFR calc non Af Amer: >60 mL/min (ref >60)
Glucose, Bld: 204 mg/dL — ABNORMAL HIGH (ref 70–99)
Potassium: 3.8 mmol/L (ref 3.5–5.1)
Sodium: 138 mmol/L (ref 135–145)
Total Bilirubin: 0.7 mg/dL (ref 0.3–1.2)
Total Protein: 7.7 g/dL (ref 6.5–8.1)

## 2020-03-05 LAB — TROPONIN I (HIGH SENSITIVITY): Troponin I (High Sensitivity): 73 ng/L — ABNORMAL HIGH (ref <18)

## 2020-03-05 LAB — BRAIN NATRIURETIC PEPTIDE: B Natriuretic Peptide: 124.7 pg/mL — ABNORMAL HIGH (ref 0.0–100.0)

## 2020-03-05 LAB — PROCALCITONIN: Procalcitonin: <0.1 ng/mL

## 2020-03-05 LAB — LACTIC ACID, PLASMA: Lactic Acid, Venous: 1.1 mmol/L (ref 0.5–1.9)

## 2020-03-05 LAB — SARS CORONAVIRUS 2 BY RT PCR (HOSPITAL ORDER, PERFORMED IN ~~LOC~~ HOSPITAL LAB): SARS Coronavirus 2: NEGATIVE

## 2020-03-05 MED ORDER — SODIUM CHLORIDE 0.9 % IV SOLN
1.0000 g | Freq: Once | INTRAVENOUS | Status: AC
  Administered 2020-03-05: 1 g via INTRAVENOUS
  Filled 2020-03-05: qty 10

## 2020-03-05 MED ORDER — ALBUTEROL SULFATE (2.5 MG/3ML) 0.083% IN NEBU
2.5000 mg | INHALATION_SOLUTION | Freq: Once | RESPIRATORY_TRACT | Status: AC
  Administered 2020-03-05: 2.5 mg via RESPIRATORY_TRACT

## 2020-03-05 MED ORDER — ASCORBIC ACID 500 MG PO TABS
500.0000 mg | ORAL_TABLET | Freq: Every day | ORAL | Status: DC
  Administered 2020-03-06 – 2020-03-10 (×5): 500 mg via ORAL
  Filled 2020-03-05 (×5): qty 1

## 2020-03-05 MED ORDER — ZINC SULFATE 220 (50 ZN) MG PO CAPS
220.0000 mg | ORAL_CAPSULE | Freq: Every day | ORAL | Status: DC
  Administered 2020-03-06 – 2020-03-10 (×5): 220 mg via ORAL
  Filled 2020-03-05 (×5): qty 1

## 2020-03-05 MED ORDER — DEXAMETHASONE SODIUM PHOSPHATE 10 MG/ML IJ SOLN
10.0000 mg | Freq: Once | INTRAMUSCULAR | Status: AC
  Administered 2020-03-05: 10 mg via INTRAVENOUS
  Filled 2020-03-05: qty 1

## 2020-03-05 MED ORDER — OXYCODONE HCL 5 MG PO TABS
10.0000 mg | ORAL_TABLET | Freq: Once | ORAL | Status: AC
  Administered 2020-03-05: 10 mg via ORAL
  Filled 2020-03-05: qty 2

## 2020-03-05 MED ORDER — SODIUM CHLORIDE 0.9 % IV SOLN
500.0000 mg | Freq: Once | INTRAVENOUS | Status: AC
  Administered 2020-03-05: 500 mg via INTRAVENOUS
  Filled 2020-03-05: qty 500

## 2020-03-05 NOTE — ED Triage Notes (Signed)
Pt ambulatory to STAT with c/o [facility]; st sent over by [facility] for O2 sat in the 70's; current pulse ox now 64% on ra; placed in w/c and taken immed to room 4; placed in hosp gown, on card monitor and O2 at 100% NRB (sat's 50% upon arrival in exam room); pt st prod cough or "orange" sputum since Tuesday with [facility]; COVID swab on Friday was negative; pt is accomp by husband who has dementia and is unable to stay alone per pt; charge nurse notified, MD and RT called to room

## 2020-03-05 NOTE — H&P (Signed)
Lauren Velazquez IRC:[id number] DOB: 08-03-1961 DOA: 03/05/2020     PCP: Hillery Aldo, MD   Outpatient Specialists:  NONE   Patient arrived to ER on 03/05/20 at 2043 Referred by Attending Chesley Noon, MD   Patient coming from: home Lives  With family    Chief Complaint:   Chief Complaint  Patient presents with  . Shortness of Breath    HPI: Lauren Velazquez is a 60 y.o. female with medical history significant of HTN and Dm2    Presented with  Cough congestion for the past 2 wks, she was seen initially by her primary care provider who prescribed Omnicef and cough syrup she did not seem to improve she was also prescribed prednisone for 6-day taper Patient has not been vaccinated states that there is no way she could have Covid. Patient initially refusing Covid test finally did agree but did not allow proper technique and only very superficial swab.  Patient threatened to leave AMA on multiple occasions while in emergency department for and agrees to stay. Emergency department she was noted to be hypoxic down to 70% She was made aware that if she is to leave AGAINST MEDICAL ADVICE while not on oxygen she will likely have a very poor outcome including death. Patient at some point has stated to the nurse she does not care At this point pt is willing to stay  Infectious risk factors:  Reports  fever, shortness of breath, dry cough, , N/V/Diarrhea/abdominal pain,  Body aches, severe fatigue    Has  NOt been vaccinated against COVID (refuses)   Initial COVID TEST  NEGATIVE but unable to perform properly Lab Results  Component Value Date   SARSCOV2NAA NEGATIVE 03/05/2020     Regarding pertinent Chronic problems:     HTN on lisinopril   DM 2 -   on insulin, Lantus 70 units twice daily   Morbid obesity-   BMI Readings from Last 1 Encounters:  03/05/20 42.07 kg/m     Asthma -well   controlled on home inhalers     While in ER: CXR - multifocal PNA WBC 15 Procalcitonin  <0.10    Hospitalist was called for admission for presumed Covid infection with acute   respiratory failure  The following Work up has been ordered so far:  Orders Placed This Encounter  Procedures  . SARS Coronavirus 2 by RT PCR (hospital order, performed in [facility] hospital lab) Nasopharyngeal Nasopharyngeal Swab  . Culture, blood (routine x 2)  . DG Chest Portable 1 View  . CBC with Differential  . Comprehensive metabolic panel  . Brain natriuretic peptide  . Procalcitonin - Baseline  . Lactic acid, plasma  . Cardiac monitoring  . Consult to hospitalist  ALL PATIENTS BEING ADMITTED/HAVING PROCEDURES NEED COVID-19 SCREENING  . ED EKG  . Saline lock IV     Following Medications were ordered in ER: Medications  cefTRIAXone (ROCEPHIN) 1 g in sodium chloride 0.9 % 100 mL IVPB (has no administration in time range)  azithromycin (ZITHROMAX) 500 mg in sodium chloride 0.9 % 250 mL IVPB (has no administration in time range)  dexamethasone (DECADRON) injection 10 mg (has no administration in time range)  oxyCODONE (Oxy IR/ROXICODONE) immediate release tablet 10 mg (has no administration in time range)  albuterol (PROVENTIL) (2.5 MG/3ML) 0.083% nebulizer solution 2.5 mg (2.5 mg Inhalation Given 03/05/20 2128)        Consult Orders  (From admission, onward)  Start     Ordered   03/05/20 2147  Consult to hospitalist  ALL PATIENTS BEING ADMITTED/HAVING PROCEDURES NEED COVID-19 SCREENING  Once       Comments: ALL PATIENTS BEING ADMITTED/HAVING PROCEDURES NEED COVID-19 SCREENING  Provider:  (Not yet assigned)  Question Answer Comment  Place call to: [phone]   Reason for Consult Admit      03/05/20 2147          Significant initial  Findings: Abnormal Labs Reviewed  CBC WITH DIFFERENTIAL/PLATELET - Abnormal; Notable for the following components:      Result Value   WBC 15.4 (*)    RBC 5.30 (*)    Hemoglobin 10.5 (*)    MCV 73.4 (*)    MCH 19.8 (*)    MCHC 27.0  (*)    RDW 18.9 (*)    Platelets 456 (*)    nRBC 0.8 (*)    Neutro Abs 12.2 (*)    Abs Immature Granulocytes 0.11 (*)    All other components within normal limits  COMPREHENSIVE METABOLIC PANEL - Abnormal; Notable for the following components:   Chloride 97 (*)    Glucose, Bld 204 (*)    Calcium 8.7 (*)    AST 11 (*)    All other components within normal limits  BRAIN NATRIURETIC PEPTIDE - Abnormal; Notable for the following components:   B Natriuretic Peptide 124.7 (*)    All other components within normal limits  TROPONIN I (HIGH SENSITIVITY) - Abnormal; Notable for the following components:   Troponin I (High Sensitivity) 73 (*)    All other components within normal limits    Otherwise labs showing:    Recent Labs  Lab 03/05/20 2058  NA 138  K 3.8  CO2 29  GLUCOSE 204*  BUN 8  CREATININE 0.71  CALCIUM 8.7*    Cr    Stable,  Lab Results  Component Value Date   CREATININE 0.71 03/05/2020   CREATININE 0.50 05/14/2018   CREATININE 0.47 12/22/2017    Recent Labs  Lab 03/05/20 2058  AST 11*  ALT 10  ALKPHOS 91  BILITOT 0.7  PROT 7.7  ALBUMIN 3.5   Lab Results  Component Value Date   CALCIUM 8.7 (L) 03/05/2020     WBC      Component Value Date/Time   WBC 15.4 (H) 03/05/2020 2058   ANC    Component Value Date/Time   NEUTROABS 12.2 (H) 03/05/2020 2058   NEUTROABS 8.8 (H) 11/19/2014 2136   ALC No components found for: LYMPHAB    Plt: Lab Results  Component Value Date   PLT 456 (H) 03/05/2020     Lactic Acid, Venous No results found for: LATICACIDVEN @ (RESUTFAST[lacticacid:4)@  Procalcitonin <0.1      HG/HCT  stable,      Component Value Date/Time   HGB 10.5 (L) 03/05/2020 2058   HGB 10.1 (L) 11/19/2014 2136   HCT 38.9 03/05/2020 2058   HCT 33.3 (L) 11/19/2014 2136    No results for input(s): LIPASE, AMYLASE in the last 168 hours. No results for input(s): AMMONIA in the last 168 hours.    Troponin  73 Cardiac Panel (last 3  results) No results for input(s): CKTOTAL, CKMB, TROPONINI, RELINDX in the last 72 hours.     ECG: Ordered    BNP (last 3 results) Recent Labs    03/05/20 2058  BNP 124.7*      UA    not ordered  Ordered     CXR - Cardiomegaly and bilatreal infiltrates    ED Triage Vitals  Enc Vitals Group     BP 03/05/20 2045 (!) 162/81     Pulse Rate 03/05/20 2045 (!) 112     Resp 03/05/20 2045 (!) 30     Temp 03/05/20 2045 98.7 F (37.1 C)     Temp Source 03/05/20 2045 Oral     SpO2 03/05/20 2045 (!) 50 %     Weight 03/05/20 2056 (!) 230 lb (104.3 kg)     Height 03/05/20 2056 5\' 2"  (1.575 m)     Head Circumference --      Peak Flow --      Pain Score 03/05/20 2056 0     Pain Loc --      Pain Edu? --      Excl. in GC? --   TMAX(24)@       Latest  Blood pressure (!) 114/90, pulse 97, temperature 98.7 F (37.1 C), temperature source Oral, resp. rate 16, height 5\' 2"  (1.575 m), weight (!) 104.3 kg, last menstrual period 08/15/2008, SpO2 97 %.       Review of Systems:    Pertinent positives include:  Fevers, chills shortness of breath at rest  dyspnea on exertion, fatigue Constitutional:  No weight loss, night sweats,  weight loss  HEENT:  No headaches, Difficulty swallowing,Tooth/dental problems,Sore throat,  No sneezing, itching, ear ache, nasal congestion, post nasal drip,  Cardio-vascular:  No chest pain, Orthopnea, PND, anasarca, dizziness, palpitations.no Bilateral lower extremity swelling  GI:  No heartburn, indigestion, abdominal pain, nausea, vomiting, diarrhea, change in bowel habits, loss of appetite, melena, blood in stool, hematemesis Resp:  no  No excess mucus, no productive cough, No non-productive cough, No coughing up of blood.No change in color of mucus.No wheezing. Skin:  no rash or lesions. No jaundice GU:  no dysuria, change in color of urine, no urgency or frequency. No straining to urinate.  No flank pain.  Musculoskeletal:  No joint pain or  no joint swelling. No decreased range of motion. No back pain.  Psych:  No change in mood or affect. No depression or anxiety. No memory loss.  Neuro: no localizing neurological complaints, no tingling, no weakness, no double vision, no gait abnormality, no slurred speech, no confusion  All systems reviewed and apart from HOPI all are negative  Past Medical History:   Past Medical History:  Diagnosis Date  . Anemia    not currently under treatment  . Anxiety   . Asthma    during allergy season  . Diabetes mellitus without complication (HCC)   . Hypertension   . Vertigo      Past Surgical History:  Procedure Laterality Date  . CARPAL TUNNEL RELEASE Right 2015  . KNEE ARTHROSCOPY Left 05/17/2018   Procedure: ARTHROSCOPY KNEE WITH LATERAL RELEASE, PARTIAL SYNOVECTOMY;  Surgeon: Kennedy Bucker, MD;  Location: [facility] OR[facility];  Service: Orthopedics;  Laterality: Left;  . KNEE ARTHROSCOPY WITH LATERAL RELEASE Right 12/28/2017   Procedure: KNEE ARTHROSCOPY WITH LATERAL RELEASE;  Surgeon: Kennedy Bucker, MD;  Location: [facility] OR[facility];  Service: Orthopedics;  Laterality: Right;  . TIBIAL TUBERCLERPLASTY Bilateral 06/01/2017   Procedure: TIBIAL TUBERCLE SPUR EXCISION;  Surgeon: Kennedy Bucker, MD;  Location: [facility] OR[facility];  Service: Orthopedics;  Laterality: Bilateral;    Social History:  Ambulatory   independently       reports that she has never smoked. She has never used smokeless tobacco. She  reports that she does not drink alcohol and does not use drugs.   Family History: Diabetes  Allergies: Allergies  Allergen Reactions  . Atorvastatin Swelling     Prior to Admission medications   Medication Sig Start Date End Date Taking? Authorizing Provider  cyclobenzaprine (FLEXERIL) 5 MG tablet Take 1 tablet (5 mg total) by mouth 3 (three) times daily as needed for muscle spasms. 03/31/19   Loleta Rose, MD  diclofenac sodium (VOLTAREN) 1 % GEL Apply topically. 01/22/18   [provider]    gabapentin (NEURONTIN) 300 MG capsule Take 900 mg by mouth 2 (two) times daily.     [provider]  insulin aspart (NOVOLOG) 100 UNIT/ML injection Inject 20-30 Units into the skin 3 (three) times daily as needed for high blood sugar (> 200).     [provider]  JARDIANCE 10 MG TABS tablet Take 10 mg by mouth daily. 05/07/18   [provider]  LANTUS SOLOSTAR 100 UNIT/ML Solostar Pen Inject 120 Units into the skin 2 (two) times daily. 04/06/17   [provider]  lisinopril (PRINIVIL,ZESTRIL) 10 MG tablet Take 10 mg by mouth daily.    [provider]  methylPREDNISolone (MEDROL DOSEPAK) 4 MG TBPK tablet 6 day dose pack - take as directed 09/21/18   Felecia Shelling, DPM  naproxen (NAPROSYN) 375 MG tablet  05/14/18   [provider]  ondansetron (ZOFRAN-ODT) 4 MG disintegrating tablet  05/18/18   [provider]  oxyCODONE (OXY IR/ROXICODONE) 5 MG immediate release tablet TK 1 T PO Q 4 H PRN P 05/22/18   [provider]  oxyCODONE-acetaminophen (PERCOCET) 10-325 MG tablet Take 1 tablet by mouth every 6 (six) hours as needed for pain.  04/12/17   [provider]  predniSONE (STERAPRED UNI-PAK 21 TAB) 10 MG (21) TBPK tablet Take 6 tablets the first day, take 5 tablets the second day, take 4 tablets the third day, take 3 tablets the fourth day, take 2 tablets the fifth day, take 1 tablet the sixth day. 04/27/19   Orvil Feil, PA-C  traMADol Janean Sark) 50 MG tablet  05/19/18   [provider]  traZODone (DESYREL) 100 MG tablet Take 200 mg by mouth at bedtime.     [provider]  venlafaxine XR (EFFEXOR-XR) 75 MG 24 hr capsule Take 75-150 mg by mouth See admin instructions. 75 mg in the morning and 150 mg at night 04/04/17   [provider]  VENTOLIN HFA 108 (90 Base) MCG/ACT inhaler Inhale 2 puffs into the lungs every 6 (six) hours as needed for wheezing or shortness of breath.  04/07/17   [provider]  zaleplon (SONATA) 5 MG capsule TK 1 C PO HS FOR SLP 09/10/18   [provider]   Physical Exam: Vitals with BMI 03/05/2020 03/05/2020 03/05/2020  Height -  -  Weight - 230 lbs -  BMI - 42.06 -  Systolic 114 - 162  Diastolic 90 - 81  Pulse 97 - 91     1. General:  in No  Acute distress   Chronically ill  -appearing 2. Psychological: Alert and Oriented 3. Head/ENT:   Moist  Mucous Membranes                          Head Non traumatic, neck supple  Poor Dentition 4. SKIN: normal Skin turgor,  Skin clean Dry and intact no rash 5. Heart: Regular rate and rhythm no  Murmur, no Rub or gallop 6. Lungs:  Clear to auscultation bilaterally, no wheezes or crackles   7. Abdomen: Soft,  non-tender, Non distended  obese   bowel sounds present 8. Lower extremities: no clubbing, cyanosis, no  edema 9. Neurologically Grossly intact, moving all 4 extremities equally  10. MSK: Normal range of motion   All other LABS:     Recent Labs  Lab 03/05/20 2058  WBC 15.4*  NEUTROABS 12.2*  HGB 10.5*  HCT 38.9  MCV 73.4*  PLT 456*     Recent Labs  Lab 03/05/20 2058  NA 138  K 3.8  CL 97*  CO2 29  GLUCOSE 204*  BUN 8  CREATININE 0.71  CALCIUM 8.7*     Recent Labs  Lab 03/05/20 2058  AST 11*  ALT 10  ALKPHOS 91  BILITOT 0.7  PROT 7.7  ALBUMIN 3.5       Cultures: No results found for: SDES, SPECREQUEST, CULT, REPTSTATUS   Radiological Exams on Admission: DG Chest Portable 1 View  Result Date: 03/05/2020 CLINICAL DATA:  Shortness of breath. EXAM: PORTABLE CHEST 1 VIEW COMPARISON:  None. FINDINGS: There are multifocal hazy airspace opacities bilaterally, greatest within the left upper and left mid lung zone. The heart size is enlarged. Aortic calcifications are noted. There is no pneumothorax. No significant pleural effusion. There is no acute osseous abnormality. IMPRESSION: 1. Multifocal airspace opacities bilaterally, left  worse than right, concerning for multifocal pneumonia (viral or bacterial). 2. Cardiomegaly. Electronically Signed   By: Katherine Mantle M.D.   On: 03/05/2020 21:28    Chart has been reviewed    Assessment/Plan   59 y.o. female with medical history significant of HTN and DM2 Admitted for presumed Covid infection with acute   respiratory failure   Present on Admission: . Suspected COVID- 19 virus infection -  ER Novel Corona Virus testing:  Ordered 03/05/20 and is  Negative but patient highly noncooperative with testing suspicious for being falsely negative Following concerning LAB/ imaging findings:    ANC/ALC ratio>3.5     Procalcitonin: low   CXR: hazy bilateral peripheral opacities      -Following work-up initiated:      sputum cultures Ordered 03/05/20,     Following complications noted:  elevated troponin noted likely demand ischemia will continue to follow   nausea/vomiting Diarrhea , decreased PO intake resulting in dehydration - will rehydrate   acute respiratory failure with hypoxia - continue oxygen treatment  Plan of treatment:   Admit on Airborn Precautions to Current facility   -given severity of illness initiate steroids Decadron  q 24 hours Since patient tested negative we will hold off on remdesivir if patient would be agreeable with could try to repeat Covid PCR but at this point patient refuses - - Will follow daily d.dimer - Assess for ability to prone  - Supportive management -Fluid sparing resuscitation  -Provide oxygen as needed currently on  SpO2: 100 % O2 Flow Rate (L/min): 6 L/min - IF d.dimer elvated >5 will increase dose of lovenox -Initially antibiotics  given given that at this point Covid PCR negative will continue Ordered respiratory panel - Consult PCCM if becomes respiratory unstable Patient voiced that she wishes to be DO NOT RESUSCITATE  Poor Prognostic factors  59 y.o.  Personal hx of  DM2,   HTN, obesity  Evidence of   organ damage  Present  elevated trop  respiratory failure requiring >4L [place]  tachypnea, tachycardia present on admission  ABS neutrophil to lymphocyte ratio >3.5     Will order Airborne and Contact precautions  Family/ patient prognosis discussion: I have discussed case with the family/ patient  who are aware of their prognosis At this point they would like to be full code      . Acute respiratory failure with hypoxia (HCC) suspect secondary to viral pneumonia bacterial pneumonia being less likely will continue supportive management obtain respiratory panel, patient is will ing to stay until tomorrow  . Hyperlipidemia -chronic stable continue home medications  . Hypertension -resume home medications once able to tolerate  . Obesity -will need nutritional consult as an outpatient  Diabetes mellitus continue sliding scale continue Lantus at decreased dose of 40 units nightly given decreased p.o. intake   Elevated troponin most likely demand ischemia in the setting of hypoxia but if continues to rise will need further interventions and and work-up ECG nonacute no chest pain Other plan as per orders.  DVT prophylaxis:    Lovenox       Code Status:    Code Status: Prior FULL CODE  as per patient  I had personally discussed CODE STATUS with patient     Family Communication:   Family  at  Bedside  plan of care was discussed  with  Husband,    Disposition Plan:     To home once workup is complete and patient is stable   Following barriers for discharge:                                                      Will likely need home health, home O2, set up                                                            Consults called: None  Admission status:  ED Disposition    ED Disposition Condition Comment   Admit  Hospital Area: [facility] [[id number]]  Level of Care: Stepdown [14]  Covid Evaluation: Symptomatic Person Under Investigation (PUI)  Diagnosis: Acute  respiratory failure with hypoxia [facility]) [[id number]]  Admitting Physician: Therisa Doyne [[id number]]  Attending Physician: Therisa Doyne [[id number]]  Estimated length of stay: 3 - 4 days  Certification:: I certify this patient will need inpatient services for at least 2 midnights         inpatient     I Expect 2 midnight stay secondary to severity of patient's current illness need for inpatient interventions justified by the following:  hemodynamic instability despite optimal treatment ( hypoxia )   Severe lab/radiological/exam abnormalities including:   Multifocal pneumonia and extensive comorbidities including:    DM2     COPD/asthma  Morbid Obesity .    That are currently affecting medical management.   I expect  patient to be hospitalized for 2 midnights requiring inpatient medical care.  Patient is at high risk for adverse outcome (such as loss of life or disability) if not treated.  Indication for inpatient stay as  follows:    Hemodynamic instability despite maximal medical therapy,    New or worsening hypoxia  Need for oxygen    Level of care         SDU tele indefinitely please discontinue once patient no longer qualifies COVID-19 Labs    Lab Results  Component Value Date   SARSCOV2NAA NEGATIVE 03/05/2020     Precautions: admitted as  PUI   Airborne and Contact precautions  Covid PCR is negative    would need additional investigation given very high risk for false nagative test result   PPE: Used by the provider:   P100  eye Goggles,  Gloves  gown   Aashika Carta 03/06/2020, 1:33 AM    [facility]     after 2 AM please page floor coverage PA If 7AM-7PM, please contact the day team taking care of the patient using [email or web address]   Patient was evaluated in the context of the global COVID-19 pandemic, which necessitated consideration that the patient might be at risk for infection with the SARS-CoV-2 virus that causes COVID-19.  Institutional protocols and algorithms that pertain to the evaluation of patients at risk for COVID-19 are in a state of rapid change based on information released by regulatory bodies including the CDC and federal and state organizations. These policies and algorithms were followed during the patient's care.

## 2020-03-05 NOTE — ED Notes (Addendum)
Pt refusing COVID swab at this time. EDP Jessup made aware.

## 2020-03-05 NOTE — ED Notes (Signed)
This Rn transitioning pt to 4L [place]- pt hypoxic at 84% RA. EDP Jessup at bedside. Pt placed immediately back on 4l [place] resulting in 96% at this time.

## 2020-03-05 NOTE — ED Notes (Addendum)
Pt placed on O2 at 4l/min via [place] per MD; pt is adamant that she is leaving hospital and will not stay despite being given consequences of leaving; pt agress to stay for "few hours and see if she is better"; pt and husband are now arguing with each other regarding her decision

## 2020-03-05 NOTE — ED Provider Notes (Signed)
[facility] Emergency Department Provider Note   ____________________________________________   First MD Initiated Contact with Patient 03/05/20 2052     (approximate)  I have reviewed the triage vital signs and the nursing notes.   HISTORY  Chief Complaint Shortness of Breath    HPI Lauren Velazquez is a 59 y.o. female with past medical history of hypertension, diabetes, and hyperlipidemia who presents to the ED complaining of shortness of breath.  Patient reports that she has been feeling ill with cough, congestion, and increasing difficulty breathing over the past 1 to 2 weeks.  She has been seen multiple times at urgent care for this problem and was recently tested for COVID-19, which was negative.  She denies any fevers and has not had any chest pain, vomiting, or diarrhea.  She denies any sick contacts but has not yet been vaccinated for COVID-19.  She was initially evaluated at an urgent care today, where she was found to have O2 sats in the 70s and subsequently referred to the ED for further evaluation.        Past Medical History:  Diagnosis Date  . Anemia    not currently under treatment  . Anxiety   . Asthma    during allergy season  . Diabetes mellitus without complication (HCC)   . Hypertension   . Vertigo     Patient Active Problem List   Diagnosis Date Noted  . Acute respiratory failure with hypoxia (HCC) 03/05/2020  . Diabetes mellitus type 2, uncomplicated (HCC) 05/30/2014  . Hyperlipidemia 05/30/2014  . Hypertension 05/30/2014  . Obesity 05/30/2014  . Vertigo 05/30/2014  . Carpal tunnel syndrome on both sides 12/27/2013    Past Surgical History:  Procedure Laterality Date  . CARPAL TUNNEL RELEASE Right 2015  . KNEE ARTHROSCOPY Left 05/17/2018   Procedure: ARTHROSCOPY KNEE WITH LATERAL RELEASE, PARTIAL SYNOVECTOMY;  Surgeon: Kennedy Bucker, MD;  Location: [facility] ORS;  Service: Orthopedics;  Laterality: Left;  . KNEE ARTHROSCOPY  WITH LATERAL RELEASE Right 12/28/2017   Procedure: KNEE ARTHROSCOPY WITH LATERAL RELEASE;  Surgeon: Kennedy Bucker, MD;  Location: [facility] ORS;  Service: Orthopedics;  Laterality: Right;  . TIBIAL TUBERCLERPLASTY Bilateral 06/01/2017   Procedure: TIBIAL TUBERCLE SPUR EXCISION;  Surgeon: Kennedy Bucker, MD;  Location: [facility] ORS;  Service: Orthopedics;  Laterality: Bilateral;    Prior to Admission medications   Medication Sig Start Date End Date Taking? Authorizing Provider  chlorpheniramine-HYDROcodone (TUSSIONEX PENNKINETIC ER) 10-8 MG/5ML SUER Take 5 mLs by mouth every 12 (twelve) hours as needed for cough.   Yes [provider]  diclofenac sodium (VOLTAREN) 1 % GEL Apply 2 g topically 2 (two) times daily as needed (pain).    Yes [provider]  gabapentin (NEURONTIN) 300 MG capsule Take 600 mg by mouth 2 (two) times daily.    Yes [provider]  insulin aspart (NOVOLOG) 100 UNIT/ML injection Inject into the skin as directed. (based upon blood sugar reading)   Yes [provider]  LANTUS SOLOSTAR 100 UNIT/ML Solostar Pen Inject 70 Units into the skin 2 (two) times daily.    Yes [provider]  lisinopril (ZESTRIL) 40 MG tablet Take 40 mg by mouth daily.    Yes [provider]  oxyCODONE-acetaminophen (PERCOCET) 10-325 MG tablet Take 1 tablet by mouth every 6 (six) hours as needed for pain.  04/12/17  Yes [provider]  traZODone (DESYREL) 100 MG tablet Take 200 mg by mouth 2 (two) times daily as needed  for sleep.    Yes [provider]  venlafaxine XR (EFFEXOR-XR) 75 MG 24 hr capsule Take 75 mg by mouth daily.    Yes [provider]  VENTOLIN HFA 108 (90 Base) MCG/ACT inhaler Inhale 2 puffs into the lungs every 6 (six) hours as needed for wheezing or shortness of breath.    Yes [provider]    Allergies Atorvastatin  No family history on file.  Social History Social History   Tobacco Use  . Smoking  status: Never Smoker  . Smokeless tobacco: Never Used  Vaping Use  . Vaping Use: Never used  Substance Use Topics  . Alcohol use: No  . Drug use: No    Review of Systems  Constitutional: Positive for fever/chills Eyes: No visual changes. ENT: No sore throat. Cardiovascular: Denies chest pain. Respiratory: Positive for cough and shortness of breath. Gastrointestinal: No abdominal pain.  No nausea, no vomiting.  No diarrhea.  No constipation. Genitourinary: Negative for dysuria. Musculoskeletal: Negative for back pain. Skin: Negative for rash. Neurological: Negative for headaches, focal weakness or numbness.  ____________________________________________   PHYSICAL EXAM:  VITAL SIGNS: ED Triage Vitals  Enc Vitals Group     BP      Pulse      Resp      Temp      Temp src      SpO2      Weight      Height      Head Circumference      Peak Flow      Pain Score      Pain Loc      Pain Edu?      Excl. in GC?     Constitutional: Alert and oriented. Eyes: Conjunctivae are normal. Head: Atraumatic. Nose: No congestion/rhinnorhea. Mouth/Throat: Mucous membranes are moist. Neck: Normal ROM Cardiovascular: Normal rate, regular rhythm. Grossly normal heart sounds. Respiratory: Mild tachypnea with normal respiratory effort.  No retractions. Lungs with rales to bilateral bases. Gastrointestinal: Soft and nontender. No distention. Genitourinary: deferred Musculoskeletal: No lower extremity tenderness nor edema. Neurologic:  Normal speech and language. No gross focal neurologic deficits are appreciated. Skin:  Skin is warm, dry and intact. No rash noted. Psychiatric: Mood and affect are normal. Speech and behavior are normal.  ____________________________________________   LABS (all labs ordered are listed, but only abnormal results are displayed)  Labs Reviewed  CBC WITH DIFFERENTIAL/PLATELET - Abnormal; Notable for the following components:      Result Value   WBC  15.4 (*)    RBC 5.30 (*)    Hemoglobin 10.5 (*)    MCV 73.4 (*)    MCH 19.8 (*)    MCHC 27.0 (*)    RDW 18.9 (*)    Platelets 456 (*)    nRBC 0.8 (*)    Neutro Abs 12.2 (*)    Abs Immature Granulocytes 0.11 (*)    All other components within normal limits  COMPREHENSIVE METABOLIC PANEL - Abnormal; Notable for the following components:   Chloride 97 (*)    Glucose, Bld 204 (*)    Calcium 8.7 (*)    AST 11 (*)    All other components within normal limits  BRAIN NATRIURETIC PEPTIDE - Abnormal; Notable for the following components:   B Natriuretic Peptide 124.7 (*)    All other components within normal limits  TROPONIN I (HIGH SENSITIVITY) - Abnormal; Notable for the following components:   Troponin I (High Sensitivity) 73 (*)  All other components within normal limits  SARS CORONAVIRUS 2 BY RT PCR (HOSPITAL ORDER, PERFORMED IN [facility])  CULTURE, BLOOD (ROUTINE X 2)  CULTURE, BLOOD (ROUTINE X 2)  RESPIRATORY PANEL BY PCR  CULTURE, BLOOD (ROUTINE X 2)  CULTURE, BLOOD (ROUTINE X 2)  EXPECTORATED SPUTUM ASSESSMENT W REFEX TO RESP CULTURE  PROCALCITONIN  LACTIC ACID, PLASMA  LACTIC ACID, PLASMA  HIV ANTIBODY (ROUTINE TESTING W REFLEX)  C-REACTIVE PROTEIN  FERRITIN  FIBRINOGEN  LACTATE DEHYDROGENASE  PROCALCITONIN  FIBRIN DERIVATIVES D-DIMER ([facility] ONLY)  CBC WITH DIFFERENTIAL/PLATELET  COMPREHENSIVE METABOLIC PANEL  MAGNESIUM  PHOSPHORUS   ____________________________________________  EKG  ED ECG REPORT I, Chesley Noon, the attending physician, personally viewed and interpreted this ECG.   Date: 03/05/2020  EKG Time: 20:53  Rate: 92  Rhythm: normal sinus rhythm  Axis: Normal  Intervals:none  ST&T Change: None   PROCEDURES  Procedure(s) performed (including Critical Care):  .Critical Care Performed by: Chesley Noon, MD Authorized by: Chesley Noon, MD   Critical care provider statement:    Critical care time (minutes):  45    Critical care time was exclusive of:  Separately billable procedures and treating other patients and teaching time   Critical care was necessary to treat or prevent imminent or life-threatening deterioration of the following conditions:  Respiratory failure   Critical care was time spent personally by me on the following activities:  Discussions with consultants, evaluation of patient's response to treatment, examination of patient, ordering and performing treatments and interventions, ordering and review of laboratory studies, ordering and review of radiographic studies, pulse oximetry, re-evaluation of patient's condition, obtaining history from patient or surrogate and review of old charts   I assumed direction of critical care for this patient from another provider in my specialty: no       ____________________________________________   INITIAL IMPRESSION / ASSESSMENT AND PLAN / ED COURSE       59 year old female with past medical history of hypertension, hyperlipidemia, and diabetes presents to the ED with cough and increasing shortness of breath over the past couple of weeks.  She was initially noted to be hypoxic to the 70s on room air at urgent care, when she arrived here in the ED she had a room air sat of 50%.  She was immediately brought back to a room and placed on a nonrebreather with improvement, able to be weaned to 4 L nasal cannula.  She is not in any respiratory distress and certainly appears to be a "happy hypoxic" typical of COVID-19 infection.  EKG shows no evidence of arrhythmia or ischemia, troponin is mildly elevated but she denies any chest pain to raise concern for ACS.  Chest x-ray appears consistent with COVID-19, however PCR testing is negative.  I suspect this is due to poor quality test given high clinical suspicion for COVID-19.  Patient was given dose of IV Decadron and case discussed with hospitalist for admission.  Patient now threatening to sign out AMA and is  very concerned about the safety of her husband, whom she states has dementia.  She states that he is not safe to be by himself given his dementia and she will sign out AMA if he is not able to be with her.  Given safety issue presented by husband being by himself as well as the fact that he is vaccinated for COVID-19, I have agreed that he may remain in the room with the patient as long as they both wear  masks.  Patient is in agreement with this and will stay for admission.      ____________________________________________   FINAL CLINICAL IMPRESSION(S) / ED DIAGNOSES  Final diagnoses:  Acute respiratory failure with hypoxia (HCC)  Suspected COVID-19 virus infection     ED Discharge Orders    None       Note:  This document was prepared using Dragon voice recognition software and may include unintentional dictation errors.   Chesley Noon, MD 03/06/20 [phone]

## 2020-03-06 DIAGNOSIS — R778 Other specified abnormalities of plasma proteins: Secondary | ICD-10-CM | POA: Diagnosis present

## 2020-03-06 DIAGNOSIS — J9601 Acute respiratory failure with hypoxia: Secondary | ICD-10-CM

## 2020-03-06 DIAGNOSIS — Z20822 Contact with and (suspected) exposure to covid-19: Secondary | ICD-10-CM | POA: Diagnosis present

## 2020-03-06 LAB — RETICULOCYTES
Immature Retic Fract: 40 % — ABNORMAL HIGH (ref 2.3–15.9)
RBC.: 4.76 MIL/uL (ref 3.87–5.11)
Retic Count, Absolute: 123.3 10*3/uL (ref 19.0–186.0)
Retic Ct Pct: 2.6 % (ref 0.4–3.1)

## 2020-03-06 LAB — LACTIC ACID, PLASMA: Lactic Acid, Venous: 1.1 mmol/L (ref 0.5–1.9)

## 2020-03-06 LAB — LACTATE DEHYDROGENASE: LDH: 178 U/L (ref 98–192)

## 2020-03-06 LAB — RESPIRATORY PANEL BY PCR

## 2020-03-06 LAB — C-REACTIVE PROTEIN: CRP: 6.9 mg/dL — ABNORMAL HIGH (ref <1.0)

## 2020-03-06 LAB — SARS CORONAVIRUS 2 BY RT PCR (HOSPITAL ORDER, PERFORMED IN ~~LOC~~ HOSPITAL LAB): SARS Coronavirus 2: NEGATIVE

## 2020-03-06 LAB — GLUCOSE, CAPILLARY
Glucose-Capillary: 298 mg/dL — ABNORMAL HIGH (ref 70–99)
Glucose-Capillary: 389 mg/dL — ABNORMAL HIGH (ref 70–99)
Glucose-Capillary: 330 mg/dL — ABNORMAL HIGH (ref 70–99)
Glucose-Capillary: 311 mg/dL — ABNORMAL HIGH (ref 70–99)
Glucose-Capillary: 215 mg/dL — ABNORMAL HIGH (ref 70–99)
Glucose-Capillary: 297 mg/dL — ABNORMAL HIGH (ref 70–99)

## 2020-03-06 LAB — FIBRIN DERIVATIVES D-DIMER (ARMC ONLY): Fibrin derivatives D-dimer (ARMC): 371.19 ng/mL (FEU) (ref 0.00–499.00)

## 2020-03-06 LAB — HIV ANTIBODY (ROUTINE TESTING W REFLEX): HIV Screen 4th Generation wRfx: NONREACTIVE

## 2020-03-06 LAB — PROCALCITONIN: Procalcitonin: <0.1 ng/mL

## 2020-03-06 LAB — FERRITIN: Ferritin: 4 ng/mL — ABNORMAL LOW (ref 11–307)

## 2020-03-06 LAB — FIBRINOGEN: Fibrinogen: 568 mg/dL — ABNORMAL HIGH (ref 210–475)

## 2020-03-06 LAB — MRSA PCR SCREENING: MRSA by PCR: NEGATIVE

## 2020-03-06 LAB — VITAMIN B12: Vitamin B-12: 305 pg/mL (ref 180–914)

## 2020-03-06 LAB — FOLATE: Folate: 7.7 ng/mL (ref >5.9)

## 2020-03-06 LAB — PHOSPHORUS: Phosphorus: 2.2 mg/dL — ABNORMAL LOW (ref 2.5–4.6)

## 2020-03-06 LAB — MAGNESIUM: Magnesium: 2 mg/dL (ref 1.7–2.4)

## 2020-03-06 LAB — TROPONIN I (HIGH SENSITIVITY): Troponin I (High Sensitivity): 47 ng/L — ABNORMAL HIGH (ref <18)

## 2020-03-06 MED ORDER — INSULIN GLARGINE 100 UNIT/ML ~~LOC~~ SOLN
40.0000 [IU] | Freq: Two times a day (BID) | SUBCUTANEOUS | Status: DC
  Filled 2020-03-06 (×2): qty 0.4

## 2020-03-06 MED ORDER — CHLORHEXIDINE GLUCONATE CLOTH 2 % EX PADS
6.0000 | MEDICATED_PAD | Freq: Every day | CUTANEOUS | Status: DC
  Administered 2020-03-06 – 2020-03-07 (×2): 6 via TOPICAL
  Filled 2020-03-06: qty 6

## 2020-03-06 MED ORDER — TRAZODONE HCL 50 MG PO TABS
200.0000 mg | ORAL_TABLET | Freq: Two times a day (BID) | ORAL | Status: DC | PRN
  Administered 2020-03-06 – 2020-03-07 (×3): 200 mg via ORAL
  Filled 2020-03-06: qty 2
  Filled 2020-03-06 (×2): qty 4

## 2020-03-06 MED ORDER — OXYCODONE HCL 5 MG PO TABS
5.0000 mg | ORAL_TABLET | Freq: Four times a day (QID) | ORAL | Status: DC | PRN
  Administered 2020-03-06 – 2020-03-08 (×8): 5 mg via ORAL
  Filled 2020-03-06 (×8): qty 1

## 2020-03-06 MED ORDER — DEXAMETHASONE SODIUM PHOSPHATE 10 MG/ML IJ SOLN
6.0000 mg | INTRAMUSCULAR | Status: DC
  Administered 2020-03-06 – 2020-03-07 (×2): 6 mg via INTRAVENOUS
  Filled 2020-03-06 (×2): qty 1
  Filled 2020-03-06: qty 0.6

## 2020-03-06 MED ORDER — ONDANSETRON HCL 4 MG/2ML IJ SOLN: 4.0000 mg | Freq: Four times a day (QID) | INTRAMUSCULAR | Status: DC | PRN

## 2020-03-06 MED ORDER — SODIUM CHLORIDE 0.9 % IV SOLN
500.0000 mg | INTRAVENOUS | Status: DC
  Administered 2020-03-06: 19:00:00 500 mg via INTRAVENOUS
  Filled 2020-03-06 (×3): qty 500

## 2020-03-06 MED ORDER — INSULIN GLARGINE 100 UNIT/ML ~~LOC~~ SOLN
40.0000 [IU] | Freq: Every day | SUBCUTANEOUS | Status: DC
  Filled 2020-03-06 (×2): qty 0.4

## 2020-03-06 MED ORDER — ONDANSETRON HCL 4 MG PO TABS
4.0000 mg | ORAL_TABLET | Freq: Four times a day (QID) | ORAL | Status: DC | PRN
  Filled 2020-03-06: qty 1

## 2020-03-06 MED ORDER — INSULIN ASPART 100 UNIT/ML ~~LOC~~ SOLN
0.0000 [IU] | SUBCUTANEOUS | Status: DC
  Administered 2020-03-06: 7 [IU] via SUBCUTANEOUS
  Administered 2020-03-06: 5 [IU] via SUBCUTANEOUS
  Filled 2020-03-06 (×2): qty 1

## 2020-03-06 MED ORDER — DEXAMETHASONE SODIUM PHOSPHATE 10 MG/ML IJ SOLN: 6.0000 mg | INTRAMUSCULAR | Status: DC

## 2020-03-06 MED ORDER — ENOXAPARIN SODIUM 40 MG/0.4ML ~~LOC~~ SOLN
40.0000 mg | Freq: Two times a day (BID) | SUBCUTANEOUS | Status: DC
  Administered 2020-03-06 – 2020-03-10 (×9): 40 mg via SUBCUTANEOUS
  Filled 2020-03-06 (×9): qty 0.4

## 2020-03-06 MED ORDER — VENLAFAXINE HCL ER 75 MG PO CP24
75.0000 mg | ORAL_CAPSULE | Freq: Every day | ORAL | Status: DC
  Administered 2020-03-06 – 2020-03-10 (×5): 75 mg via ORAL
  Filled 2020-03-06 (×5): qty 1

## 2020-03-06 MED ORDER — OXYCODONE-ACETAMINOPHEN 10-325 MG PO TABS: 1.0000 | ORAL_TABLET | Freq: Four times a day (QID) | ORAL | Status: DC | PRN

## 2020-03-06 MED ORDER — ALBUTEROL SULFATE HFA 108 (90 BASE) MCG/ACT IN AERS
2.0000 | INHALATION_SPRAY | Freq: Four times a day (QID) | RESPIRATORY_TRACT | Status: DC | PRN
  Filled 2020-03-06: qty 6.7

## 2020-03-06 MED ORDER — ALBUTEROL SULFATE (2.5 MG/3ML) 0.083% IN NEBU: 3.0000 mL | INHALATION_SOLUTION | Freq: Four times a day (QID) | RESPIRATORY_TRACT | Status: DC | PRN

## 2020-03-06 MED ORDER — INSULIN ASPART 100 UNIT/ML ~~LOC~~ SOLN
0.0000 [IU] | Freq: Three times a day (TID) | SUBCUTANEOUS | Status: DC
  Administered 2020-03-06: 7 [IU] via SUBCUTANEOUS
  Administered 2020-03-06: 17:00:00 3 [IU] via SUBCUTANEOUS
  Administered 2020-03-07: 9 [IU] via SUBCUTANEOUS
  Administered 2020-03-07: 18:00:00 2 [IU] via SUBCUTANEOUS
  Administered 2020-03-07: 09:00:00 7 [IU] via SUBCUTANEOUS
  Administered 2020-03-08: 18:00:00 3 [IU] via SUBCUTANEOUS
  Administered 2020-03-08: 7 [IU] via SUBCUTANEOUS
  Administered 2020-03-08: 13:00:00 5 [IU] via SUBCUTANEOUS
  Administered 2020-03-09: 10:00:00 2 [IU] via SUBCUTANEOUS
  Filled 2020-03-06 (×9): qty 1

## 2020-03-06 MED ORDER — SODIUM CHLORIDE 0.9 % IV SOLN
250.0000 mL | INTRAVENOUS | Status: DC | PRN
  Administered 2020-03-06: 17:00:00 250 mL via INTRAVENOUS

## 2020-03-06 MED ORDER — GUAIFENESIN-DM 100-10 MG/5ML PO SYRP
10.0000 mL | ORAL_SOLUTION | ORAL | Status: DC | PRN
  Administered 2020-03-07: 10 mL via ORAL
  Filled 2020-03-06 (×3): qty 10

## 2020-03-06 MED ORDER — OXYCODONE-ACETAMINOPHEN 5-325 MG PO TABS
1.0000 | ORAL_TABLET | Freq: Four times a day (QID) | ORAL | Status: DC | PRN
  Administered 2020-03-06 – 2020-03-08 (×9): 1 via ORAL
  Filled 2020-03-06 (×9): qty 1

## 2020-03-06 MED ORDER — SODIUM CHLORIDE 0.9% FLUSH
3.0000 mL | Freq: Two times a day (BID) | INTRAVENOUS | Status: DC
  Administered 2020-03-06 – 2020-03-08 (×4): 3 mL via INTRAVENOUS

## 2020-03-06 MED ORDER — GABAPENTIN 300 MG PO CAPS
600.0000 mg | ORAL_CAPSULE | Freq: Two times a day (BID) | ORAL | Status: DC
  Administered 2020-03-06 – 2020-03-10 (×8): 600 mg via ORAL
  Filled 2020-03-06 (×10): qty 2

## 2020-03-06 MED ORDER — SODIUM CHLORIDE 0.9 % IV SOLN
1.0000 g | INTRAVENOUS | Status: DC
  Administered 2020-03-06 – 2020-03-09 (×4): 1 g via INTRAVENOUS
  Filled 2020-03-06: qty 1
  Filled 2020-03-06: qty 10
  Filled 2020-03-06: qty 1
  Filled 2020-03-06: qty 10
  Filled 2020-03-06 (×2): qty 1

## 2020-03-06 MED ORDER — INSULIN GLARGINE 100 UNIT/ML ~~LOC~~ SOLN
70.0000 [IU] | Freq: Two times a day (BID) | SUBCUTANEOUS | Status: DC
  Administered 2020-03-06 – 2020-03-07 (×4): 70 [IU] via SUBCUTANEOUS
  Filled 2020-03-06 (×6): qty 0.7

## 2020-03-06 MED ORDER — LISINOPRIL 20 MG PO TABS
40.0000 mg | ORAL_TABLET | Freq: Every day | ORAL | Status: DC
  Administered 2020-03-06 – 2020-03-10 (×5): 40 mg via ORAL
  Filled 2020-03-06 (×6): qty 2

## 2020-03-06 MED ORDER — SODIUM CHLORIDE 0.9% FLUSH
3.0000 mL | Freq: Two times a day (BID) | INTRAVENOUS | Status: DC
  Administered 2020-03-07 – 2020-03-10 (×6): 3 mL via INTRAVENOUS

## 2020-03-06 MED ORDER — SODIUM CHLORIDE 0.9% FLUSH
3.0000 mL | INTRAVENOUS | Status: DC | PRN
  Administered 2020-03-06: 3 mL via INTRAVENOUS

## 2020-03-06 MED ORDER — ACETAMINOPHEN 325 MG PO TABS
650.0000 mg | ORAL_TABLET | Freq: Four times a day (QID) | ORAL | Status: DC | PRN
  Administered 2020-03-08: 650 mg via ORAL
  Filled 2020-03-06: qty 2

## 2020-03-06 NOTE — Progress Notes (Signed)
Report called to 1C RN. Pt A&OX4. VSS. Sating 97% on 2L by nasal cannula. Covid negative. Droplet precautions for pending respiratory panel.

## 2020-03-06 NOTE — Progress Notes (Signed)
Patient remains stable on 5 lpm, continuously removing monitor equipment. Requesting to be moved to a less monitored floor. She has also removed her third IV, I was able to place another IV in her L forearm.

## 2020-03-06 NOTE — ED Notes (Signed)
Pt's husband sent back to be with pt, and questioned this RN about airborne precautions sign. This RN apologetically informed pt that until her COVID results come back she was not allowed to have any visitors. Pt's husband stated, "I was in there earlier and they said I could come back, I just left to go feed the grand kids and came right back". This RN explained the COVID & Airborne precautions policy. Pt's husband understood and was asked to sit out side of the room- he did as such.  Pt informed about the same policies mentioned above. Pt became very irate st'ing " What you mean he can't come back? He has dementia and I'm his primary care giver, we live in the same house and he's been out there just driving around lost/ What if he got into an accident? I need him in here to take care of him so he can help take care of me.," Pt and visitor educated about the risks of avoidable exposure and transfernce to other staff/pt's. EDP Jessup informed pt of the dangers of leaving AMA, pt st "if he cant stay then I'm leaving, I've been asking for food all day and not gotten it. I asked for something to drink and not gotten it and now here I am and your telling  Me this bullshit".  Pt requesting to leave AMA at this time (23:30)   Dr. Larinda Buttery at bedside and MD [facility] notified via secure chat of the incident above.   EDP & Hospitalist compromised with the pt stating if she and he stay in the room and continually wear their masks; that he can stay while he is in the ER. Pt argumentative and passive aggressive at this time. Husband escorted to room. Pt agreeing to stay at this time (23:35)

## 2020-03-06 NOTE — ED Notes (Signed)
Pt called out cursing at staff stating that she had not been given a drink. Pt was given a beverage by this RN and cursed at due to not bringing a meal in with me. This RN informed the pt that she had a bed upstairs. Pt st her husband needs to go with her since he has dementia. Pt's husband states to this RN "Magdelene stop.[name] just go with it. Ma'am I do not have dementia. Do I seem like a typical dementia patient to you. I understand what you are telling her and I am fine with not being able to stay. I am going to go home." Pt replies "he does have dementia look at him, dont dementia patients say they dont have dementia."   The pt and her husband being to bicker at this time. Husband seems A&Ox4. Husband later apologizes for his wifes behavior.

## 2020-03-06 NOTE — ED Notes (Signed)
Pt removed oxygen again stating  "its not even working aint nothing coming out". Pt's RA O2 sat is 74% on RA . Oxygen tubing replaced and pt's LPM reflecting 5L/hr via [place]. This RN informed pt of the purpose of the oxygen and requested it stay on. Pt coughing with removed mask., Pt asked to place mask back on. Will continue to monitor.

## 2020-03-06 NOTE — ED Notes (Signed)
Pt on 5LPM  and continues to de-stat to 70-60% due to intentional removal of oxygen device. Pt educated on purpose of wearing oxygen and the outcomes of removal by EDP Jessup and this RN. Pt disregards information. Pt st "I've been walking around at home with it as low as 50 and I didn't fall out". Pt asked to please keep O2 on and wear her mask. EDP Larinda Buttery and MD [place] aware

## 2020-03-06 NOTE — Progress Notes (Signed)
PHARMACIST - PHYSICIAN COMMUNICATION  CONCERNING:  Enoxaparin (Lovenox) for DVT Prophylaxis    RECOMMENDATION: Patient was prescribed enoxaprin 40mg  q24 hours for VTE prophylaxis.   Filed Weights   03/05/20 2056  Weight: (!) 104.3 kg (230 lb)    Body mass index is 42.07 kg/m.  Estimated Creatinine Clearance: 87.9 mL/min (by C-G formula based on SCr of 0.71 mg/dL).   Based on [facility] policy patient is candidate for enoxaparin 40mg  every 12 hour dosing due to BMI being >40.  DESCRIPTION: Pharmacy has adjusted enoxaparin dose per [facility] policy.  Patient is now receiving enoxaparin 40mg  every 12 hours.    [facility], PharmD Clinical Pharmacist  03/06/2020 2:58 AM

## 2020-03-06 NOTE — Progress Notes (Signed)
PROGRESS NOTE    Lauren Velazquez  XYI:[id number] DOB: 1961/07/11 DOA: 03/05/2020 PCP: Hillery Aldo, MD    Assessment & Plan:   Active Problems:   Diabetes mellitus type 2, uncomplicated (HCC)   Hyperlipidemia   Hypertension   Obesity   Acute respiratory failure with hypoxia (HCC)   Suspected COVID-19 virus infection   Elevated troponin    Lauren Velazquez is a 59 y.o. Caucasian female with medical history significant of HTN and Dm2 who presented with  Cough congestion for the past 2 wks, she was seen initially by her primary care provider who prescribed Omnicef and cough syrup she did not seem to improve she was also prescribed prednisone for 6-day taper Patient has not been vaccinated states that there is no way she could have Covid.  Emergency department she was noted to be hypoxic down to 70%, and pt was admitted to stepdown.    Acute respiratory failure with hypoxia (HCC)  --needed 6L on presentation, down to 2L today.  CXR showed "Multifocal airspace opacities bilaterally, left worse than right, concerning for multifocal pneumonia (viral or bacterial)." --COVID neg, RVP neg.  Procal neg.  CRP 6.9. --started on ceftriaxone, azithromycin and IV deca. PLAN: --continue ceftriaxone, azithromycin and IV deca. --wean supplemental O2 as able, goal sat >=92%   Hyperlipidemia  -chronic stable continue home medications   Hypertension  -continue home Lisinopril    Obesity -will need nutritional consult as an outpatient  Diabetes mellitus  continue sliding scale  continue Lantus at 70u BID    Elevated troponin most likely demand ischemia in the setting of hypoxia  ECG nonacute no chest pain    DVT prophylaxis: Lovenox SQ Code Status: Full code  Family Communication:  Status is: inpatient Dispo:   The patient is from: home Anticipated d/c is to: home Anticipated d/c date is: 1-2 days Patient currently is not medically stable to d/c due to: still needing 2L O2,  on IV abx for PNA   Subjective and Interval History:  Pt reported feeling much better.  O2 requirement decreased down to 2L.    COVID neg again.  Transferred out of stepdown to floor today.   Objective: Vitals:   03/06/20 1101 03/06/20 1102 03/06/20 1239 03/06/20 1831  BP:  (!) 112/57 118/78 (!) 125/47  Pulse:  78 70 82  Resp:  20 14 18   Temp: (!) 97.5 F (36.4 C)  97.7 F (36.5 C) 98.4 F (36.9 C)  TempSrc: Axillary  Oral Oral  SpO2:  97% 99% 94%  Weight:      Height:       No intake or output data in the 24 hours ending 03/06/20 1835 Filed Weights   03/05/20 2056  Weight: (!) 104.3 kg    Examination:   Constitutional: NAD, AAOx3 HEENT: conjunctivae and lids normal, EOMI CV: RRR no M,R,G. Distal pulses +2.  No cyanosis.   RESP: CTA B/L, normal respiratory effort, on 2L GI: +BS, NTND, soft Extremities: No effusions, edema, or tenderness in BLE SKIN: warm, dry and intact Neuro: II - XII grossly intact.  Sensation intact Psych: Normal mood and affect.     Data Reviewed: I have personally reviewed following labs and imaging studies  CBC: Recent Labs  Lab 03/05/20 2058  WBC 15.4*  NEUTROABS 12.2*  HGB 10.5*  HCT 38.9  MCV 73.4*  PLT 456*   Basic Metabolic Panel: Recent Labs  Lab 03/05/20 2058 03/06/20 0705  NA 138  --   K 3.8  --  CL 97*  --   CO2 29  --   GLUCOSE 204*  --   BUN 8  --   CREATININE 0.71  --   CALCIUM 8.7*  --   MG  --  2.0  PHOS  --  2.2*   GFR: Estimated Creatinine Clearance: 87.9 mL/min (by C-G formula based on SCr of 0.71 mg/dL). Liver Function Tests: Recent Labs  Lab 03/05/20 2058  AST 11*  ALT 10  ALKPHOS 91  BILITOT 0.7  PROT 7.7  ALBUMIN 3.5   No results for input(s): LIPASE, AMYLASE in the last 168 hours. No results for input(s): AMMONIA in the last 168 hours. Coagulation Profile: No results for input(s): INR, PROTIME in the last 168 hours. Cardiac Enzymes: No results for input(s): CKTOTAL, CKMB, CKMBINDEX,  TROPONINI in the last 168 hours. BNP (last 3 results) No results for input(s): PROBNP in the last 8760 hours. HbA1C: No results for input(s): HGBA1C in the last 72 hours. CBG: Recent Labs  Lab 03/06/20 0421 03/06/20 0639 03/06/20 0744 03/06/20 1116 03/06/20 1637  GLUCAP 298* 389* 330* 311* 215*   Lipid Profile: No results for input(s): CHOL, HDL, LDLCALC, TRIG, CHOLHDL, LDLDIRECT in the last 72 hours. Thyroid Function Tests: No results for input(s): TSH, T4TOTAL, FREET4, T3FREE, THYROIDAB in the last 72 hours. Anemia Panel: Recent Labs    03/06/20 0705  VITAMINB12 305  FOLATE 7.7  FERRITIN 4*  RETICCTPCT 2.6   Sepsis Labs: Recent Labs  Lab 03/05/20 2138 03/05/20 2151 03/06/20 0705  PROCALCITON <0.10  --  <0.10  LATICACIDVEN  --  1.1 1.1    Recent Results (from the past 240 hour(s))  SARS Coronavirus 2 by RT PCR (hospital order, performed in [facility] hospital lab) Nasopharyngeal Nasopharyngeal Swab     Status: None   Collection Time: 03/05/20  9:01 PM   Specimen: Nasopharyngeal Swab  Result Value Ref Range Status   SARS Coronavirus 2 NEGATIVE NEGATIVE Final    Comment: (NOTE) SARS-CoV-2 target nucleic acids are NOT DETECTED.  The SARS-CoV-2 RNA is generally detectable in upper and lower respiratory specimens during the acute phase of infection. The lowest concentration of SARS-CoV-2 viral copies this assay can detect is 250 copies / mL. A negative result does not preclude SARS-CoV-2 infection and should not be used as the sole basis for treatment or other patient management decisions.  A negative result may occur with improper specimen collection / handling, submission of specimen other than nasopharyngeal swab, presence of viral mutation(s) within the areas targeted by this assay, and inadequate number of viral copies (<250 copies / mL). A negative result must be combined with clinical observations, patient history, and epidemiological information.  Fact  Sheet for Patients:   BoilerBrush.com.cy  Fact Sheet for Healthcare Providers: https://pope.com/  This test is not yet approved or  cleared by the [place] FDA and has been authorized for detection and/or diagnosis of SARS-CoV-2 by FDA under an Emergency Use Authorization (EUA).  This EUA will remain in effect (meaning this test can be used) for the duration of the COVID-19 declaration under Section 564(b)(1) of the Act, 21 U.S.C. section 360bbb-3(b)(1), unless the authorization is terminated or revoked sooner.  Performed at [facility], [address] [place], [place] [address]   Culture, blood (routine x 2)     Status: None (Preliminary result)   Collection Time: 03/05/20  9:51 PM   Specimen: Right Antecubital; Blood  Result Value Ref Range Status   Specimen Description  RIGHT ANTECUBITAL  Final   Special Requests   Final    BOTTLES DRAWN AEROBIC AND ANAEROBIC Blood Culture results may not be optimal due to an inadequate volume of blood received in culture bottles   Culture   Final    NO GROWTH < 12 HOURS Performed at [facility], [address] [place], [place] [address]    Report Status PENDING  Incomplete  Culture, blood (routine x 2)     Status: None (Preliminary result)   Collection Time: 03/05/20  9:52 PM   Specimen: BLOOD LEFT HAND  Result Value Ref Range Status   Specimen Description BLOOD LEFT HAND  Final   Special Requests   Final    BOTTLES DRAWN AEROBIC AND ANAEROBIC Blood Culture adequate volume   Culture   Final    NO GROWTH < 12 HOURS Performed at [facility], [address], [place], [place] [address]    Report Status PENDING  Incomplete  MRSA PCR Screening     Status: None   Collection Time: 03/06/20  3:24 AM   Specimen: Nasopharyngeal  Result Value Ref Range Status   MRSA by PCR NEGATIVE NEGATIVE Final    Comment:        The GeneXpert MRSA Assay (FDA approved for  NASAL specimens only), is one component of a comprehensive MRSA colonization surveillance program. It is not intended to diagnose MRSA infection nor to guide or monitor treatment for MRSA infections. Performed at [facility], [address] [place], [place] [address]   Respiratory Panel by PCR     Status: None   Collection Time: 03/06/20  7:00 AM   Specimen: Nasopharyngeal Swab; Respiratory  Result Value Ref Range Status   Adenovirus NOT DETECTED NOT DETECTED Final   Coronavirus 229E NOT DETECTED NOT DETECTED Final    Comment: (NOTE) The Coronavirus on the Respiratory Panel, DOES NOT test for the novel  Coronavirus (2019 nCoV)    Coronavirus HKU1 NOT DETECTED NOT DETECTED Final   Coronavirus NL63 NOT DETECTED NOT DETECTED Final   Coronavirus OC43 NOT DETECTED NOT DETECTED Final   Metapneumovirus NOT DETECTED NOT DETECTED Final   Rhinovirus / Enterovirus NOT DETECTED NOT DETECTED Final   Influenza A NOT DETECTED NOT DETECTED Final   Influenza B NOT DETECTED NOT DETECTED Final   Parainfluenza Virus 1 NOT DETECTED NOT DETECTED Final   Parainfluenza Virus 2 NOT DETECTED NOT DETECTED Final   Parainfluenza Virus 3 NOT DETECTED NOT DETECTED Final   Parainfluenza Virus 4 NOT DETECTED NOT DETECTED Final   Respiratory Syncytial Virus NOT DETECTED NOT DETECTED Final   Bordetella pertussis NOT DETECTED NOT DETECTED Final   Chlamydophila pneumoniae NOT DETECTED NOT DETECTED Final   Mycoplasma pneumoniae NOT DETECTED NOT DETECTED Final    Comment: Performed at [facility], [address] [place], [place] [address]  SARS Coronavirus 2 by RT PCR (hospital order, performed in [facility] hospital lab) Nasopharyngeal Nasopharyngeal Swab     Status: None   Collection Time: 03/06/20  7:00 AM   Specimen: Nasopharyngeal Swab  Result Value Ref Range Status   SARS Coronavirus 2 NEGATIVE NEGATIVE Final    Comment: (NOTE) SARS-CoV-2 target nucleic acids are NOT DETECTED.  The  SARS-CoV-2 RNA is generally detectable in upper and lower respiratory specimens during the acute phase of infection. The lowest concentration of SARS-CoV-2 viral copies this assay can detect is 250 copies / mL. A negative result does not preclude SARS-CoV-2 infection and  should not be used as the sole basis for treatment or other patient management decisions.  A negative result may occur with improper specimen collection / handling, submission of specimen other than nasopharyngeal swab, presence of viral mutation(s) within the areas targeted by this assay, and inadequate number of viral copies (<250 copies / mL). A negative result must be combined with clinical observations, patient history, and epidemiological information.  Fact Sheet for Patients:   BoilerBrush.com.cy  Fact Sheet for Healthcare Providers: https://pope.com/  This test is not yet approved or  cleared by the [place] FDA and has been authorized for detection and/or diagnosis of SARS-CoV-2 by FDA under an Emergency Use Authorization (EUA).  This EUA will remain in effect (meaning this test can be used) for the duration of the COVID-19 declaration under Section 564(b)(1) of the Act, 21 U.S.C. section 360bbb-3(b)(1), unless the authorization is terminated or revoked sooner.  Performed at [facility], [address] [place], [place] [address]       Radiology Studies: DG Chest Portable 1 View  Result Date: 03/05/2020 CLINICAL DATA:  Shortness of breath. EXAM: PORTABLE CHEST 1 VIEW COMPARISON:  None. FINDINGS: There are multifocal hazy airspace opacities bilaterally, greatest within the left upper and left mid lung zone. The heart size is enlarged. Aortic calcifications are noted. There is no pneumothorax. No significant pleural effusion. There is no acute osseous abnormality. IMPRESSION: 1. Multifocal airspace opacities bilaterally, left worse than right,  concerning for multifocal pneumonia (viral or bacterial). 2. Cardiomegaly. Electronically Signed   By: Katherine Mantle M.D.   On: 03/05/2020 21:28     Scheduled Meds:  vitamin C  500 mg Oral Daily   Chlorhexidine Gluconate Cloth  6 each Topical Daily   dexamethasone (DECADRON) injection  6 mg Intravenous Q24H   enoxaparin (LOVENOX) injection  40 mg Subcutaneous Q12H   gabapentin  600 mg Oral BID   insulin aspart  0-9 Units Subcutaneous TID WC   insulin glargine  70 Units Subcutaneous BID   lisinopril  40 mg Oral Daily   sodium chloride flush  3 mL Intravenous Q12H   sodium chloride flush  3 mL Intravenous Q12H   venlafaxine XR  75 mg Oral Daily   zinc sulfate  220 mg Oral Daily   Continuous Infusions:  sodium chloride 250 mL (03/06/20 1637)   azithromycin (ZITHROMAX) 500 MG IVPB (Vial-Mate [employer])     cefTRIAXone (ROCEPHIN)  IV       LOS: 1 day     Darlin Priestly, MD [facility] If 7PM-7AM, please contact night-coverage 03/06/2020, 6:35 PM

## 2020-03-06 NOTE — Plan of Care (Signed)

## 2020-03-06 NOTE — ED Notes (Addendum)
Floor called in order to give report. Per ICU Charge and [employer], due to pt's high possible suspicion of COVID she is not allowed visitation for the safety of her family and staff until a proper swab is collected. Pt informed of this information. Pt took IV out at this time. Pt st "Is it gonna be like this up here when I go?" I reminded the pt that due to the safety risk of her husband wandering w/ dementia  and no family who could come and pick him up; that we allowed him to stay with her and that once she was admitted to the floor she and he would have to separate". Pt irate at this time. ED Charge called.   Pt c/o various issues that were addressed. Pt has little insight to the situation and is unable to reason with. Pt finally agree's to go upstairs at this time.

## 2020-03-06 NOTE — ED Notes (Signed)
Pt's COVID resulted as negative. Hospitalist made aware of pt's ineffective swab testing due to pt refusal. Verbal orders via EDP and Hospitalist to continue pt on airborne precautions at this time. Pt currently requesting food stating "idk why yall so worried about me having covid and the test was negative, yall just want to put it on my chart so you get some money. Covid this Covid that. I've had 2 done, how many more yall gone do?.". Pt informed of NPO order and only water with sips of meds   (this RN consulted Dr. Adela Glimpse in regards to changing the diet order. Verbal orders to change diet to liquid thin- for potential aspiration risk r/t oxygen desaturation & advance as tolerated)   Pt informed she can get something to drink and was give a beverage. Pt now stating "So yall just gone let me starve then huh you know Im a diabetic, see this is the shit im talking about." Pt informed of conversation with hospitalist in regards to diet. Pt cussing and defensive at this time.   Pt st "I'ma just leave then, I dont care if my oxygen drops. It was already 51% earlier and yall didn't do nothing and it aint do nothing to me". This RN attempted to reason with the pt, pt then states that if she does not have a room by morning then she will leave. EDP and Hospitalist notified

## 2020-03-06 NOTE — ED Notes (Addendum)
This RN visualized oxygen reading at 63%. This RN promptly replaced oxygen tubing back onto pt. Pt currently on the cell phone stating "it fell off" Pt refusing Resp. Panel swab at this time stating "I know I dont have it"   Previous RN informed this RN in report that the pt was very difficult to obtain a COVID sample on and refused it multiple times before stating "they already did that". Pt attempting to move swab from RN. Sample sent to lab.   Hospitalist consulted regarding a order adjustment to allow for lab to run the Resp. Panel swab along with the COVID swab. Verbal orders allowed.

## 2020-03-07 LAB — BASIC METABOLIC PANEL
Anion gap: 10 (ref 5–15)
BUN: 15 mg/dL (ref 6–20)
CO2: 30 mmol/L (ref 22–32)
Calcium: 8.4 mg/dL — ABNORMAL LOW (ref 8.9–10.3)
Chloride: 98 mmol/L (ref 98–111)
Creatinine, Ser: 0.7 mg/dL (ref 0.44–1.00)
GFR calc Af Amer: >60 mL/min (ref >60)
GFR calc non Af Amer: >60 mL/min (ref >60)
Glucose, Bld: 330 mg/dL — ABNORMAL HIGH (ref 70–99)
Potassium: 4.5 mmol/L (ref 3.5–5.1)
Sodium: 138 mmol/L (ref 135–145)

## 2020-03-07 LAB — C-REACTIVE PROTEIN: CRP: 4.7 mg/dL — ABNORMAL HIGH (ref <1.0)

## 2020-03-07 LAB — CBC
HCT: 33.7 % — ABNORMAL LOW (ref 36.0–46.0)
Hemoglobin: 8.9 g/dL — ABNORMAL LOW (ref 12.0–15.0)
MCH: 19.5 pg — ABNORMAL LOW (ref 26.0–34.0)
MCHC: 26.4 g/dL — ABNORMAL LOW (ref 30.0–36.0)
MCV: 73.7 fL — ABNORMAL LOW (ref 80.0–100.0)
Platelets: 356 10*3/uL (ref 150–400)
RBC: 4.57 MIL/uL (ref 3.87–5.11)
RDW: 18.3 % — ABNORMAL HIGH (ref 11.5–15.5)
WBC: 11.5 10*3/uL — ABNORMAL HIGH (ref 4.0–10.5)
nRBC: 0.3 % — ABNORMAL HIGH (ref 0.0–0.2)

## 2020-03-07 LAB — GLUCOSE, CAPILLARY
Glucose-Capillary: 325 mg/dL — ABNORMAL HIGH (ref 70–99)
Glucose-Capillary: 378 mg/dL — ABNORMAL HIGH (ref 70–99)
Glucose-Capillary: 181 mg/dL — ABNORMAL HIGH (ref 70–99)
Glucose-Capillary: 166 mg/dL — ABNORMAL HIGH (ref 70–99)

## 2020-03-07 LAB — HEMOGLOBIN A1C
Hgb A1c MFr Bld: 9.1 % — ABNORMAL HIGH (ref 4.8–5.6)
Mean Plasma Glucose: 214 mg/dL

## 2020-03-07 LAB — MAGNESIUM: Magnesium: 2.2 mg/dL (ref 1.7–2.4)

## 2020-03-07 MED ORDER — INSULIN ASPART 100 UNIT/ML ~~LOC~~ SOLN
7.0000 [IU] | Freq: Three times a day (TID) | SUBCUTANEOUS | Status: DC
  Administered 2020-03-07 – 2020-03-09 (×6): 7 [IU] via SUBCUTANEOUS
  Filled 2020-03-07 (×5): qty 1

## 2020-03-07 MED ORDER — AZITHROMYCIN 500 MG PO TABS
500.0000 mg | ORAL_TABLET | Freq: Every day | ORAL | Status: DC
  Administered 2020-03-07 – 2020-03-09 (×3): 500 mg via ORAL
  Filled 2020-03-07 (×3): qty 1

## 2020-03-07 NOTE — Progress Notes (Signed)
PROGRESS NOTE    Lauren Velazquez  IRS:[id number] DOB: 09-02-1960 DOA: 03/05/2020 PCP: Hillery Aldo, MD    Assessment & Plan:   Active Problems:   Diabetes mellitus type 2, uncomplicated (HCC)   Hyperlipidemia   Hypertension   Obesity   Acute respiratory failure with hypoxia (HCC)   Suspected COVID-19 virus infection   Elevated troponin    Lauren Velazquez is a 59 y.o. Caucasian female with medical history significant of HTN and Dm2 who presented with  Cough congestion for the past 2 wks, she was seen initially by her primary care provider who prescribed Omnicef and cough syrup she did not seem to improve she was also prescribed prednisone for 6-day taper Patient has not been vaccinated states that there is no way she could have Covid.  Emergency department she was noted to be hypoxic down to 70%, and pt was admitted to stepdown.   . Acute respiratory failure with hypoxia  2/2 likely bacterial PNA --needed 6L on presentation, down to 2L today.  CXR showed "Multifocal airspace opacities bilaterally, left worse than right, concerning for multifocal pneumonia (viral or bacterial)." --COVID neg, RVP neg.  Procal neg.  CRP 6.9. --started on ceftriaxone, azithromycin and IV deca.   PLAN: --continue ceftriaxone, azithromycin (change to oral) --d/c IV deca. --wean supplemental O2 as able, goal sat >=92%  . Hyperlipidemia  -chronic stable continue home medications  . Hypertension  --continue home Lisionpril  . Obesity  -will need nutritional consult as an outpatient  Diabetes mellitus  --BG elevated continue sliding scale  --add meal-time 7u TID continue Lantus at 70u BID   Elevated troponin most likely demand ischemia in the setting of hypoxia  ECG nonacute no chest pain  Depression --continue home Effexor   DVT prophylaxis: Lovenox SQ Code Status: Full code  Family Communication:  Status is: inpatient Dispo:   The patient is from: home Anticipated d/c is to:  home Anticipated d/c date is: 1-2 days Patient currently is not medically stable to d/c due to: still needing 2L O2 (new), on IV abx for PNA   Subjective and Interval History:  Pt reported feeling and breathing better.  On 2L [place] but desat in RA.  No fever.   Objective: Vitals:   03/07/20 0048 03/07/20 0450 03/07/20 0744 03/07/20 1146  BP: (!) 114/63 125/67 (!) 142/64 116/67  Pulse: 68 77 67 61  Resp: 16  18 16   Temp: 97.9 F (36.6 C) 98 F (36.7 C) 98.3 F (36.8 C) 97.9 F (36.6 C)  TempSrc: Oral Oral Oral Oral  SpO2: 93% 93% 97% 97%  Weight:      Height:        Intake/Output Summary (Last 24 hours) at 03/07/2020 1538 Last data filed at 03/07/2020 0500 Gross per 24 hour  Intake 1290.9 ml  Output --  Net 1290.9 ml   Filed Weights   03/05/20 2056  Weight: (!) 104.3 kg    Examination:   Constitutional: NAD, AAOx3 HEENT: conjunctivae and lids normal, EOMI CV: RRR no M,R,G. Distal pulses +2.  No cyanosis.   RESP: CTA B/L, normal respiratory effort, on 2L GI: +BS, NTND Extremities: No effusions, edema, or tenderness in BLE SKIN: warm, dry and intact Neuro: II - XII grossly intact.  Sensation intact Psych: Normal mood and affect.      Data Reviewed: I have personally reviewed following labs and imaging studies  CBC: Recent Labs  Lab 03/05/20 2058 03/07/20 0335  WBC 15.4* 11.5*  NEUTROABS 12.2*  --  HGB 10.5* 8.9*  HCT 38.9 33.7*  MCV 73.4* 73.7*  PLT 456* 356   Basic Metabolic Panel: Recent Labs  Lab 03/05/20 2058 03/06/20 0705 03/07/20 0335  NA 138  --  138  K 3.8  --  4.5  CL 97*  --  98  CO2 29  --  30  GLUCOSE 204*  --  330*  BUN 8  --  15  CREATININE 0.71  --  0.70  CALCIUM 8.7*  --  8.4*  MG  --  2.0 2.2  PHOS  --  2.2*  --    GFR: Estimated Creatinine Clearance: 87.9 mL/min (by C-G formula based on SCr of 0.7 mg/dL). Liver Function Tests: Recent Labs  Lab 03/05/20 2058  AST 11*  ALT 10  ALKPHOS 91  BILITOT 0.7  PROT 7.7   ALBUMIN 3.5   No results for input(s): LIPASE, AMYLASE in the last 168 hours. No results for input(s): AMMONIA in the last 168 hours. Coagulation Profile: No results for input(s): INR, PROTIME in the last 168 hours. Cardiac Enzymes: No results for input(s): CKTOTAL, CKMB, CKMBINDEX, TROPONINI in the last 168 hours. BNP (last 3 results) No results for input(s): PROBNP in the last 8760 hours. HbA1C: Recent Labs    03/06/20 0705  HGBA1C 9.1*   CBG: Recent Labs  Lab 03/06/20 1116 03/06/20 1637 03/06/20 2137 03/07/20 0744 03/07/20 1146  GLUCAP 311* 215* 297* 325* 378*   Lipid Profile: No results for input(s): CHOL, HDL, LDLCALC, TRIG, CHOLHDL, LDLDIRECT in the last 72 hours. Thyroid Function Tests: No results for input(s): TSH, T4TOTAL, FREET4, T3FREE, THYROIDAB in the last 72 hours. Anemia Panel: Recent Labs    03/06/20 0705  VITAMINB12 305  FOLATE 7.7  FERRITIN 4*  RETICCTPCT 2.6   Sepsis Labs: Recent Labs  Lab 03/05/20 2138 03/05/20 2151 03/06/20 0705  PROCALCITON <0.10  --  <0.10  LATICACIDVEN  --  1.1 1.1    Recent Results (from the past 240 hour(s))  SARS Coronavirus 2 by RT PCR (hospital order, performed in [facility] hospital lab) Nasopharyngeal Nasopharyngeal Swab     Status: None   Collection Time: 03/05/20  9:01 PM   Specimen: Nasopharyngeal Swab  Result Value Ref Range Status   SARS Coronavirus 2 NEGATIVE NEGATIVE Final    Comment: (NOTE) SARS-CoV-2 target nucleic acids are NOT DETECTED.  The SARS-CoV-2 RNA is generally detectable in upper and lower respiratory specimens during the acute phase of infection. The lowest concentration of SARS-CoV-2 viral copies this assay can detect is 250 copies / mL. A negative result does not preclude SARS-CoV-2 infection and should not be used as the sole basis for treatment or other patient management decisions.  A negative result may occur with improper specimen collection / handling, submission of specimen  other than nasopharyngeal swab, presence of viral mutation(s) within the areas targeted by this assay, and inadequate number of viral copies (<250 copies / mL). A negative result must be combined with clinical observations, patient history, and epidemiological information.  Fact Sheet for Patients:   BoilerBrush.com.cy  Fact Sheet for Healthcare Providers: https://pope.com/  This test is not yet approved or  cleared by the [place] FDA and has been authorized for detection and/or diagnosis of SARS-CoV-2 by FDA under an Emergency Use Authorization (EUA).  This EUA will remain in effect (meaning this test can be used) for the duration of the COVID-19 declaration under Section 564(b)(1) of the Act, 21 U.S.C. section 360bbb-3(b)(1), unless the authorization  is terminated or revoked sooner.  Performed at [facility], [address] [place], [place] [address]   Culture, blood (routine x 2)     Status: None (Preliminary result)   Collection Time: 03/05/20  9:51 PM   Specimen: Right Antecubital; Blood  Result Value Ref Range Status   Specimen Description RIGHT ANTECUBITAL  Final   Special Requests   Final    BOTTLES DRAWN AEROBIC AND ANAEROBIC Blood Culture results may not be optimal due to an inadequate volume of blood received in culture bottles   Culture   Final    NO GROWTH 2 DAYS Performed at [facility], [address] [place], [place] [address]    Report Status PENDING  Incomplete  Culture, blood (routine x 2)     Status: None (Preliminary result)   Collection Time: 03/05/20  9:52 PM   Specimen: BLOOD LEFT HAND  Result Value Ref Range Status   Specimen Description BLOOD LEFT HAND  Final   Special Requests   Final    BOTTLES DRAWN AEROBIC AND ANAEROBIC Blood Culture adequate volume   Culture   Final    NO GROWTH 2 DAYS Performed at [facility], [address], [place], [place] [address]      Report Status PENDING  Incomplete  MRSA PCR Screening     Status: None   Collection Time: 03/06/20  3:24 AM   Specimen: Nasopharyngeal  Result Value Ref Range Status   MRSA by PCR NEGATIVE NEGATIVE Final    Comment:        The GeneXpert MRSA Assay (FDA approved for NASAL specimens only), is one component of a comprehensive MRSA colonization surveillance program. It is not intended to diagnose MRSA infection nor to guide or monitor treatment for MRSA infections. Performed at [facility], [address] [place], [place] [address]   Respiratory Panel by PCR     Status: None   Collection Time: 03/06/20  7:00 AM   Specimen: Nasopharyngeal Swab; Respiratory  Result Value Ref Range Status   Adenovirus NOT DETECTED NOT DETECTED Final   Coronavirus 229E NOT DETECTED NOT DETECTED Final    Comment: (NOTE) The Coronavirus on the Respiratory Panel, DOES NOT test for the novel  Coronavirus (2019 nCoV)    Coronavirus HKU1 NOT DETECTED NOT DETECTED Final   Coronavirus NL63 NOT DETECTED NOT DETECTED Final   Coronavirus OC43 NOT DETECTED NOT DETECTED Final   Metapneumovirus NOT DETECTED NOT DETECTED Final   Rhinovirus / Enterovirus NOT DETECTED NOT DETECTED Final   Influenza A NOT DETECTED NOT DETECTED Final   Influenza B NOT DETECTED NOT DETECTED Final   Parainfluenza Virus 1 NOT DETECTED NOT DETECTED Final   Parainfluenza Virus 2 NOT DETECTED NOT DETECTED Final   Parainfluenza Virus 3 NOT DETECTED NOT DETECTED Final   Parainfluenza Virus 4 NOT DETECTED NOT DETECTED Final   Respiratory Syncytial Virus NOT DETECTED NOT DETECTED Final   Bordetella pertussis NOT DETECTED NOT DETECTED Final   Chlamydophila pneumoniae NOT DETECTED NOT DETECTED Final   Mycoplasma pneumoniae NOT DETECTED NOT DETECTED Final    Comment: Performed at [facility], [address] [place], [place] [address]  SARS Coronavirus 2 by RT PCR (hospital order, performed in [facility] hospital lab)  Nasopharyngeal Nasopharyngeal Swab     Status: None   Collection Time: 03/06/20  7:00 AM   Specimen: Nasopharyngeal Swab  Result Value Ref Range Status   SARS Coronavirus 2 NEGATIVE NEGATIVE Final  Comment: (NOTE) SARS-CoV-2 target nucleic acids are NOT DETECTED.  The SARS-CoV-2 RNA is generally detectable in upper and lower respiratory specimens during the acute phase of infection. The lowest concentration of SARS-CoV-2 viral copies this assay can detect is 250 copies / mL. A negative result does not preclude SARS-CoV-2 infection and should not be used as the sole basis for treatment or other patient management decisions.  A negative result may occur with improper specimen collection / handling, submission of specimen other than nasopharyngeal swab, presence of viral mutation(s) within the areas targeted by this assay, and inadequate number of viral copies (<250 copies / mL). A negative result must be combined with clinical observations, patient history, and epidemiological information.  Fact Sheet for Patients:   BoilerBrush.com.cy  Fact Sheet for Healthcare Providers: https://pope.com/  This test is not yet approved or  cleared by the [place] FDA and has been authorized for detection and/or diagnosis of SARS-CoV-2 by FDA under an Emergency Use Authorization (EUA).  This EUA will remain in effect (meaning this test can be used) for the duration of the COVID-19 declaration under Section 564(b)(1) of the Act, 21 U.S.C. section 360bbb-3(b)(1), unless the authorization is terminated or revoked sooner.  Performed at [facility], [address], [place], [place] [address]       Radiology Studies: DG Chest Portable 1 View  Result Date: 03/05/2020 CLINICAL DATA:  Shortness of breath. EXAM: PORTABLE CHEST 1 VIEW COMPARISON:  None. FINDINGS: There are multifocal hazy airspace opacities bilaterally, greatest within  the left upper and left mid lung zone. The heart size is enlarged. Aortic calcifications are noted. There is no pneumothorax. No significant pleural effusion. There is no acute osseous abnormality. IMPRESSION: 1. Multifocal airspace opacities bilaterally, left worse than right, concerning for multifocal pneumonia (viral or bacterial). 2. Cardiomegaly. Electronically Signed   By: Katherine Mantle M.D.   On: 03/05/2020 21:28     Scheduled Meds: . vitamin C  500 mg Oral Daily  . azithromycin  500 mg Oral Daily  . Chlorhexidine Gluconate Cloth  6 each Topical Daily  . dexamethasone (DECADRON) injection  6 mg Intravenous Q24H  . enoxaparin (LOVENOX) injection  40 mg Subcutaneous Q12H  . gabapentin  600 mg Oral BID  . insulin aspart  0-9 Units Subcutaneous TID WC  . insulin aspart  7 Units Subcutaneous TID WC  . insulin glargine  70 Units Subcutaneous BID  . lisinopril  40 mg Oral Daily  . sodium chloride flush  3 mL Intravenous Q12H  . sodium chloride flush  3 mL Intravenous Q12H  . venlafaxine XR  75 mg Oral Daily  . zinc sulfate  220 mg Oral Daily   Continuous Infusions: . sodium chloride 250 mL (03/06/20 1637)  . cefTRIAXone (ROCEPHIN)  IV Stopped (03/07/20 0010)     LOS: 2 days     Darlin Priestly, MD [facility] If 7PM-7AM, please contact night-coverage 03/07/2020, 3:38 PM

## 2020-03-08 LAB — CBC
HCT: 31.5 % — ABNORMAL LOW (ref 36.0–46.0)
Hemoglobin: 8.7 g/dL — ABNORMAL LOW (ref 12.0–15.0)
MCH: 19.9 pg — ABNORMAL LOW (ref 26.0–34.0)
MCHC: 27.6 g/dL — ABNORMAL LOW (ref 30.0–36.0)
MCV: 72.1 fL — ABNORMAL LOW (ref 80.0–100.0)
Platelets: 323 10*3/uL (ref 150–400)
RBC: 4.37 MIL/uL (ref 3.87–5.11)
RDW: 18.6 % — ABNORMAL HIGH (ref 11.5–15.5)
WBC: 9.6 10*3/uL (ref 4.0–10.5)
nRBC: 0.3 % — ABNORMAL HIGH (ref 0.0–0.2)

## 2020-03-08 LAB — BASIC METABOLIC PANEL
Anion gap: 8 (ref 5–15)
BUN: 17 mg/dL (ref 6–20)
CO2: 29 mmol/L (ref 22–32)
Calcium: 8.6 mg/dL — ABNORMAL LOW (ref 8.9–10.3)
Chloride: 99 mmol/L (ref 98–111)
Creatinine, Ser: 0.74 mg/dL (ref 0.44–1.00)
GFR calc Af Amer: >60 mL/min (ref >60)
GFR calc non Af Amer: >60 mL/min (ref >60)
Glucose, Bld: 424 mg/dL — ABNORMAL HIGH (ref 70–99)
Potassium: 4.5 mmol/L (ref 3.5–5.1)
Sodium: 136 mmol/L (ref 135–145)

## 2020-03-08 LAB — GLUCOSE, CAPILLARY
Glucose-Capillary: 343 mg/dL — ABNORMAL HIGH (ref 70–99)
Glucose-Capillary: 273 mg/dL — ABNORMAL HIGH (ref 70–99)
Glucose-Capillary: 201 mg/dL — ABNORMAL HIGH (ref 70–99)
Glucose-Capillary: 220 mg/dL — ABNORMAL HIGH (ref 70–99)

## 2020-03-08 LAB — MAGNESIUM: Magnesium: 2.2 mg/dL (ref 1.7–2.4)

## 2020-03-08 LAB — C-REACTIVE PROTEIN: CRP: 1.7 mg/dL — ABNORMAL HIGH (ref <1.0)

## 2020-03-08 MED ORDER — IPRATROPIUM-ALBUTEROL 0.5-2.5 (3) MG/3ML IN SOLN
3.0000 mL | Freq: Four times a day (QID) | RESPIRATORY_TRACT | Status: DC
  Administered 2020-03-08 – 2020-03-10 (×5): 3 mL via RESPIRATORY_TRACT
  Filled 2020-03-08 (×8): qty 3

## 2020-03-08 MED ORDER — TRAZODONE HCL 50 MG PO TABS
300.0000 mg | ORAL_TABLET | Freq: Every day | ORAL | Status: DC
  Administered 2020-03-08 – 2020-03-09 (×2): 300 mg via ORAL
  Filled 2020-03-08 (×2): qty 6

## 2020-03-08 MED ORDER — INSULIN GLARGINE 100 UNIT/ML ~~LOC~~ SOLN
100.0000 [IU] | Freq: Two times a day (BID) | SUBCUTANEOUS | Status: DC
  Administered 2020-03-08 (×2): 100 [IU] via SUBCUTANEOUS
  Filled 2020-03-08 (×4): qty 1

## 2020-03-08 MED ORDER — OXYCODONE HCL 5 MG PO TABS
10.0000 mg | ORAL_TABLET | Freq: Four times a day (QID) | ORAL | Status: DC | PRN
  Administered 2020-03-08 – 2020-03-10 (×6): 10 mg via ORAL
  Filled 2020-03-08 (×6): qty 2

## 2020-03-08 MED ORDER — TRAZODONE HCL 50 MG PO TABS
100.0000 mg | ORAL_TABLET | Freq: Every day | ORAL | Status: DC
  Administered 2020-03-08 – 2020-03-10 (×3): 100 mg via ORAL
  Filled 2020-03-08 (×3): qty 2

## 2020-03-08 MED ORDER — HYDROXYZINE HCL 25 MG PO TABS
25.0000 mg | ORAL_TABLET | Freq: Every evening | ORAL | Status: DC | PRN
  Filled 2020-03-08: qty 1

## 2020-03-08 NOTE — Progress Notes (Signed)
Arrived for svn. Patient c/o lack of communication from nursing in reference to pain meds since 4pm. States she is not taking any breathing meds. Asked patient to have nursing to call if changes mind.

## 2020-03-08 NOTE — Progress Notes (Signed)
PROGRESS NOTE    Lauren Velazquez  TLX:[id number] DOB: 07-Aug-1961 DOA: 03/05/2020 PCP: Hillery Aldo, MD    Assessment & Plan:   Active Problems:   Diabetes mellitus type 2, uncomplicated (HCC)   Hyperlipidemia   Hypertension   Obesity   Acute respiratory failure with hypoxia (HCC)   Suspected COVID-19 virus infection   Elevated troponin    Lauren Velazquez is a 59 y.o. Caucasian female with medical history significant of HTN and Dm2 who presented with  Cough congestion for the past 2 wks, she was seen initially by her primary care provider who prescribed Omnicef and cough syrup she did not seem to improve she was also prescribed prednisone for 6-day taper Patient has not been vaccinated states that there is no way she could have Covid.  Emergency department she was noted to be hypoxic down to 70%, and pt was admitted to stepdown.    Acute respiratory failure with hypoxia  2/2 likely bacterial PNA --needed 6L on presentation, down to 2L on 7/24.  CXR showed "Multifocal airspace opacities bilaterally, left worse than right, concerning for multifocal pneumonia (viral or bacterial)." --COVID neg, RVP neg.  Procal neg.  CRP 6.9. --started on ceftriaxone, azithromycin and IV deca (d/c'ed on 7/24 since no COVID) PLAN: --continue ceftriaxone, azithromycin (change to oral) --wean supplemental O2 as able, goal sat >=92% --Start DuoNeb QID today   Hyperlipidemia  --No statin on home med list   Hypertension  --continue home Lisionpril   Obesity  -will need nutritional consult as an outpatient  Diabetes mellitus  --BG elevated continue sliding scale  --continue meal-time 7u TID --increase Lantus to 100 u BID  Elevated troponin most likely demand ischemia in the setting of hypoxia  ECG nonacute no chest pain  Depression and insomnia  --continue home Effexor --trazodone 300 mg nightly and 100 mg daily, per pt --Atarax nightly PRN for anxiety and insomnia (will not start  benzo)    Chronic pain on chronic opioids --Pt reported taking oxycodone 10 mg q6h at home. --Will continue oxycodone 10 mg q6h PRN for now --Pt needs to provide current Rx or pharmacy for verification    DVT prophylaxis: Lovenox SQ Code Status: Full code  Family Communication:  Status is: inpatient Dispo:   The patient is from: home Anticipated d/c is to: home Anticipated d/c date is: 1-2 days Patient currently is not medically stable to d/c due to: still needing 2L O2 (new), need to be off O2 before discharge, since pt doesn't have chronic condition to qualify for home O2   Subjective and Interval History:  Pt reported breathing was worse last night.  Pt also had trouble sleeping.  No fever.   Objective: Vitals:   03/08/20 0002 03/08/20 0555 03/08/20 0751 03/08/20 1145  BP: (!) 151/73 (!) 136/73 (!) 147/81   Pulse: 84 60 59   Resp: 20 19 20    Temp: 97.8 F (36.6 C) 98.5 F (36.9 C) 97.9 F (36.6 C)   TempSrc: Oral Oral Oral   SpO2: 96% 100% 100% 98%  Weight:      Height:       No intake or output data in the 24 hours ending 03/08/20 1328 Filed Weights   03/05/20 2056  Weight: (!) 104.3 kg    Examination:   Constitutional: NAD, AAOx3 HEENT: conjunctivae and lids normal, EOMI CV: RRR no M,R,G. Distal pulses +2.  No cyanosis.   RESP: CTA B/L, normal respiratory effort, on 2L GI: +BS, NTND Extremities: No  effusions, edema, or tenderness in BLE SKIN: warm, dry and intact Neuro: II - XII grossly intact.  Sensation intact Psych: Normal mood and affect.  Appropriate judgement and reason    Data Reviewed: I have personally reviewed following labs and imaging studies  CBC: Recent Labs  Lab 03/05/20 2058 03/07/20 0335 03/08/20 0421  WBC 15.4* 11.5* 9.6  NEUTROABS 12.2*  --   --   HGB 10.5* 8.9* 8.7*  HCT 38.9 33.7* 31.5*  MCV 73.4* 73.7* 72.1*  PLT 456* 356 323   Basic Metabolic Panel: Recent Labs  Lab 03/05/20 2058 03/06/20 0705 03/07/20 0335  03/08/20 0421  NA 138  --  138 136  K 3.8  --  4.5 4.5  CL 97*  --  98 99  CO2 29  --  30 29  GLUCOSE 204*  --  330* 424*  BUN 8  --  15 17  CREATININE 0.71  --  0.70 0.74  CALCIUM 8.7*  --  8.4* 8.6*  MG  --  2.0 2.2 2.2  PHOS  --  2.2*  --   --    GFR: Estimated Creatinine Clearance: 87.9 mL/min (by C-G formula based on SCr of 0.74 mg/dL). Liver Function Tests: Recent Labs  Lab 03/05/20 2058  AST 11*  ALT 10  ALKPHOS 91  BILITOT 0.7  PROT 7.7  ALBUMIN 3.5   No results for input(s): LIPASE, AMYLASE in the last 168 hours. No results for input(s): AMMONIA in the last 168 hours. Coagulation Profile: No results for input(s): INR, PROTIME in the last 168 hours. Cardiac Enzymes: No results for input(s): CKTOTAL, CKMB, CKMBINDEX, TROPONINI in the last 168 hours. BNP (last 3 results) No results for input(s): PROBNP in the last 8760 hours. HbA1C: Recent Labs    03/06/20 0705  HGBA1C 9.1*   CBG: Recent Labs  Lab 03/07/20 1146 03/07/20 1659 03/07/20 2042 03/08/20 0749 03/08/20 1151  GLUCAP 378* 181* 166* 343* 273*   Lipid Profile: No results for input(s): CHOL, HDL, LDLCALC, TRIG, CHOLHDL, LDLDIRECT in the last 72 hours. Thyroid Function Tests: No results for input(s): TSH, T4TOTAL, FREET4, T3FREE, THYROIDAB in the last 72 hours. Anemia Panel: Recent Labs    03/06/20 0705  VITAMINB12 305  FOLATE 7.7  FERRITIN 4*  RETICCTPCT 2.6   Sepsis Labs: Recent Labs  Lab 03/05/20 2138 03/05/20 2151 03/06/20 0705  PROCALCITON <0.10  --  <0.10  LATICACIDVEN  --  1.1 1.1    Recent Results (from the past 240 hour(s))  SARS Coronavirus 2 by RT PCR (hospital order, performed in [facility] hospital lab) Nasopharyngeal Nasopharyngeal Swab     Status: None   Collection Time: 03/05/20  9:01 PM   Specimen: Nasopharyngeal Swab  Result Value Ref Range Status   SARS Coronavirus 2 NEGATIVE NEGATIVE Final    Comment: (NOTE) SARS-CoV-2 target nucleic acids are NOT  DETECTED.  The SARS-CoV-2 RNA is generally detectable in upper and lower respiratory specimens during the acute phase of infection. The lowest concentration of SARS-CoV-2 viral copies this assay can detect is 250 copies / mL. A negative result does not preclude SARS-CoV-2 infection and should not be used as the sole basis for treatment or other patient management decisions.  A negative result may occur with improper specimen collection / handling, submission of specimen other than nasopharyngeal swab, presence of viral mutation(s) within the areas targeted by this assay, and inadequate number of viral copies (<250 copies / mL). A negative result must be combined  with clinical observations, patient history, and epidemiological information.  Fact Sheet for Patients:   BoilerBrush.com.cy  Fact Sheet for Healthcare Providers: https://pope.com/  This test is not yet approved or  cleared by the [place] FDA and has been authorized for detection and/or diagnosis of SARS-CoV-2 by FDA under an Emergency Use Authorization (EUA).  This EUA will remain in effect (meaning this test can be used) for the duration of the COVID-19 declaration under Section 564(b)(1) of the Act, 21 U.S.C. section 360bbb-3(b)(1), unless the authorization is terminated or revoked sooner.  Performed at [facility], [address] [place], [place] [address]   Culture, blood (routine x 2)     Status: None (Preliminary result)   Collection Time: 03/05/20  9:51 PM   Specimen: Right Antecubital; Blood  Result Value Ref Range Status   Specimen Description RIGHT ANTECUBITAL  Final   Special Requests   Final    BOTTLES DRAWN AEROBIC AND ANAEROBIC Blood Culture results may not be optimal due to an inadequate volume of blood received in culture bottles   Culture   Final    NO GROWTH 3 DAYS Performed at [facility], [address] [place],  [place] [address]    Report Status PENDING  Incomplete  Culture, blood (routine x 2)     Status: None (Preliminary result)   Collection Time: 03/05/20  9:52 PM   Specimen: BLOOD LEFT HAND  Result Value Ref Range Status   Specimen Description BLOOD LEFT HAND  Final   Special Requests   Final    BOTTLES DRAWN AEROBIC AND ANAEROBIC Blood Culture adequate volume   Culture   Final    NO GROWTH 3 DAYS Performed at [facility], [address], [place], [place] [address]    Report Status PENDING  Incomplete  MRSA PCR Screening     Status: None   Collection Time: 03/06/20  3:24 AM   Specimen: Nasopharyngeal  Result Value Ref Range Status   MRSA by PCR NEGATIVE NEGATIVE Final    Comment:        The GeneXpert MRSA Assay (FDA approved for NASAL specimens only), is one component of a comprehensive MRSA colonization surveillance program. It is not intended to diagnose MRSA infection nor to guide or monitor treatment for MRSA infections. Performed at [facility], [address] [place], [place] [address]   Respiratory Panel by PCR     Status: None   Collection Time: 03/06/20  7:00 AM   Specimen: Nasopharyngeal Swab; Respiratory  Result Value Ref Range Status   Adenovirus NOT DETECTED NOT DETECTED Final   Coronavirus 229E NOT DETECTED NOT DETECTED Final    Comment: (NOTE) The Coronavirus on the Respiratory Panel, DOES NOT test for the novel  Coronavirus (2019 nCoV)    Coronavirus HKU1 NOT DETECTED NOT DETECTED Final   Coronavirus NL63 NOT DETECTED NOT DETECTED Final   Coronavirus OC43 NOT DETECTED NOT DETECTED Final   Metapneumovirus NOT DETECTED NOT DETECTED Final   Rhinovirus / Enterovirus NOT DETECTED NOT DETECTED Final   Influenza A NOT DETECTED NOT DETECTED Final   Influenza B NOT DETECTED NOT DETECTED Final   Parainfluenza Virus 1 NOT DETECTED NOT DETECTED Final   Parainfluenza Virus 2 NOT DETECTED NOT DETECTED Final   Parainfluenza Virus 3 NOT DETECTED NOT  DETECTED Final   Parainfluenza Virus 4 NOT DETECTED NOT DETECTED Final   Respiratory Syncytial Virus NOT DETECTED NOT DETECTED Final   Bordetella pertussis NOT DETECTED NOT  DETECTED Final   Chlamydophila pneumoniae NOT DETECTED NOT DETECTED Final   Mycoplasma pneumoniae NOT DETECTED NOT DETECTED Final    Comment: Performed at [facility], [address] [place], [place] [address]  SARS Coronavirus 2 by RT PCR (hospital order, performed in [facility] hospital lab) Nasopharyngeal Nasopharyngeal Swab     Status: None   Collection Time: 03/06/20  7:00 AM   Specimen: Nasopharyngeal Swab  Result Value Ref Range Status   SARS Coronavirus 2 NEGATIVE NEGATIVE Final    Comment: (NOTE) SARS-CoV-2 target nucleic acids are NOT DETECTED.  The SARS-CoV-2 RNA is generally detectable in upper and lower respiratory specimens during the acute phase of infection. The lowest concentration of SARS-CoV-2 viral copies this assay can detect is 250 copies / mL. A negative result does not preclude SARS-CoV-2 infection and should not be used as the sole basis for treatment or other patient management decisions.  A negative result may occur with improper specimen collection / handling, submission of specimen other than nasopharyngeal swab, presence of viral mutation(s) within the areas targeted by this assay, and inadequate number of viral copies (<250 copies / mL). A negative result must be combined with clinical observations, patient history, and epidemiological information.  Fact Sheet for Patients:   BoilerBrush.com.cy  Fact Sheet for Healthcare Providers: https://pope.com/  This test is not yet approved or  cleared by the [place] FDA and has been authorized for detection and/or diagnosis of SARS-CoV-2 by FDA under an Emergency Use Authorization (EUA).  This EUA will remain in effect (meaning this test can be used) for the duration of  the COVID-19 declaration under Section 564(b)(1) of the Act, 21 U.S.C. section 360bbb-3(b)(1), unless the authorization is terminated or revoked sooner.  Performed at [facility], [address], [place], [place] [address]       Radiology Studies: No results found.   Scheduled Meds:  vitamin C  500 mg Oral Daily   azithromycin  500 mg Oral Daily   enoxaparin (LOVENOX) injection  40 mg Subcutaneous Q12H   gabapentin  600 mg Oral BID   insulin aspart  0-9 Units Subcutaneous TID WC   insulin aspart  7 Units Subcutaneous TID WC   insulin glargine  100 Units Subcutaneous BID   ipratropium-albuterol  3 mL Nebulization QID   lisinopril  40 mg Oral Daily   sodium chloride flush  3 mL Intravenous Q12H   traZODone  100 mg Oral Daily   traZODone  300 mg Oral QHS   venlafaxine XR  75 mg Oral Daily   zinc sulfate  220 mg Oral Daily   Continuous Infusions:  sodium chloride 250 mL (03/06/20 1637)   cefTRIAXone (ROCEPHIN)  IV Stopped (03/08/20 0229)     LOS: 3 days     Darlin Priestly, MD [facility] If 7PM-7AM, please contact night-coverage 03/08/2020, 1:28 PM

## 2020-03-09 LAB — BASIC METABOLIC PANEL
Anion gap: 6 (ref 5–15)
BUN: 16 mg/dL (ref 6–20)
CO2: 32 mmol/L (ref 22–32)
Calcium: 8.1 mg/dL — ABNORMAL LOW (ref 8.9–10.3)
Chloride: 104 mmol/L (ref 98–111)
Creatinine, Ser: 0.71 mg/dL (ref 0.44–1.00)
GFR calc Af Amer: >60 mL/min (ref >60)
GFR calc non Af Amer: >60 mL/min (ref >60)
Glucose, Bld: 211 mg/dL — ABNORMAL HIGH (ref 70–99)
Potassium: 3.7 mmol/L (ref 3.5–5.1)
Sodium: 142 mmol/L (ref 135–145)

## 2020-03-09 LAB — GLUCOSE, CAPILLARY
Glucose-Capillary: 153 mg/dL — ABNORMAL HIGH (ref 70–99)
Glucose-Capillary: 93 mg/dL (ref 70–99)
Glucose-Capillary: 92 mg/dL (ref 70–99)
Glucose-Capillary: 122 mg/dL — ABNORMAL HIGH (ref 70–99)

## 2020-03-09 LAB — C-REACTIVE PROTEIN: CRP: 0.8 mg/dL (ref <1.0)

## 2020-03-09 LAB — CBC
HCT: 29.5 % — ABNORMAL LOW (ref 36.0–46.0)
Hemoglobin: 8.1 g/dL — ABNORMAL LOW (ref 12.0–15.0)
MCH: 19.7 pg — ABNORMAL LOW (ref 26.0–34.0)
MCHC: 27.5 g/dL — ABNORMAL LOW (ref 30.0–36.0)
MCV: 71.6 fL — ABNORMAL LOW (ref 80.0–100.0)
Platelets: 296 10*3/uL (ref 150–400)
RBC: 4.12 MIL/uL (ref 3.87–5.11)
RDW: 18.5 % — ABNORMAL HIGH (ref 11.5–15.5)
WBC: 7.7 10*3/uL (ref 4.0–10.5)
nRBC: 0.3 % — ABNORMAL HIGH (ref 0.0–0.2)

## 2020-03-09 LAB — MAGNESIUM: Magnesium: 2.2 mg/dL (ref 1.7–2.4)

## 2020-03-09 MED ORDER — ALUM & MAG HYDROXIDE-SIMETH 200-200-20 MG/5ML PO SUSP
30.0000 mL | ORAL | Status: DC | PRN
  Administered 2020-03-09: 03:00:00 30 mL via ORAL
  Filled 2020-03-09: qty 30

## 2020-03-09 MED ORDER — INSULIN ASPART 100 UNIT/ML ~~LOC~~ SOLN
10.0000 [IU] | Freq: Three times a day (TID) | SUBCUTANEOUS | Status: DC
  Administered 2020-03-09 (×3): 10 [IU] via SUBCUTANEOUS
  Filled 2020-03-09 (×2): qty 1

## 2020-03-09 MED ORDER — INSULIN GLARGINE 100 UNIT/ML ~~LOC~~ SOLN
80.0000 [IU] | Freq: Two times a day (BID) | SUBCUTANEOUS | Status: DC
  Administered 2020-03-09 (×2): 80 [IU] via SUBCUTANEOUS
  Filled 2020-03-09 (×4): qty 0.8

## 2020-03-09 NOTE — Progress Notes (Signed)
PROGRESS NOTE    Lauren Velazquez  WER:[id number] DOB: 18-Jun-1961 DOA: 03/05/2020 PCP: Hillery Aldo, MD    Assessment & Plan:   Active Problems:   Diabetes mellitus type 2, uncomplicated (HCC)   Hyperlipidemia   Hypertension   Obesity   Acute respiratory failure with hypoxia (HCC)   Suspected COVID-19 virus infection   Elevated troponin    Lauren Velazquez is a 59 y.o. Caucasian female with medical history significant of HTN and Dm2 who presented with  Cough congestion for the past 2 wks, she was seen initially by her primary care provider who prescribed Omnicef and cough syrup she did not seem to improve she was also prescribed prednisone for 6-day taper Patient has not been vaccinated states that there is no way she could have Covid.  Emergency department she was noted to be hypoxic down to 70%, and pt was admitted to stepdown.   . Acute respiratory failure with hypoxia  2/2 likely bacterial PNA --needed 6L on presentation, down to 2L on 7/24.  CXR showed "Multifocal airspace opacities bilaterally, left worse than right, concerning for multifocal pneumonia (viral or bacterial)." --COVID neg, RVP neg.  Procal neg.  CRP 6.9. --started on ceftriaxone, azithromycin and IV deca (d/c'ed on 7/24 since no COVID) --Pt's O2 requirement improved to RA at rest, however, still desat to 85% with walking.   PLAN: --continue ceftriaxone, azithromycin (change to oral), day 5 of 5 --wean supplemental O2 as able, goal sat >=92% --continue DuoNeb QID --walk test tomorrow  . Hyperlipidemia  --No statin on home med list  . Hypertension  --continue home Lisionpril  . Obesity  --weight loss with outpatient provider  Diabetes mellitus  --BG elevated continue sliding scale  --increase meal-time to 10u TID --decrease Lantus to 80 u BID  Elevated troponin most likely demand ischemia in the setting of hypoxia  ECG nonacute no chest pain  Depression and insomnia  --continue home  Effexor --trazodone 300 mg nightly and 100 mg daily, per pt --Atarax nightly PRN for anxiety and insomnia (will not start benzo)    Chronic pain on chronic opioids --Pt reported taking oxycodone 10 mg q6h at home, verified. --continue home oxycodone 10 mg q6h PRN    DVT prophylaxis: Lovenox SQ Code Status: Full code  Family Communication:  Status is: inpatient Dispo:   The patient is from: home Anticipated d/c is to: home Anticipated d/c date is: likely tomorrow Patient currently is not medically stable to d/c due to: need to be off O2 before discharge, since pt doesn't have chronic condition to qualify for home O2   Subjective and Interval History:  Pt's O2 requirement improved to RA at rest, however, still desat to 85% with walking.     Objective: Vitals:   03/09/20 0812 03/09/20 1143 03/09/20 1218 03/09/20 1628  BP: (!) 152/76  (!) 135/78   Pulse: 64  71   Resp: 12  16   Temp: 97.7 F (36.5 C)  98.2 F (36.8 C)   TempSrc: Oral  Oral   SpO2: 97% 97% 94% 98%  Weight:      Height:       No intake or output data in the 24 hours ending 03/09/20 1906 Filed Weights   03/05/20 2056  Weight: (!) 104.3 kg    Examination:   Constitutional: NAD, AAOx3 HEENT: conjunctivae and lids normal, EOMI CV: RRR no M,R,G. Distal pulses +2.  No cyanosis.   RESP: CTA B/L, normal respiratory effort, on RA while resting  GI: +BS, NTND Extremities: No effusions, edema, or tenderness in BLE SKIN: warm, dry and intact Neuro: II - XII grossly intact.  Sensation intact Psych: Normal mood and affect.  Appropriate judgement and reason    Data Reviewed: I have personally reviewed following labs and imaging studies  CBC: Recent Labs  Lab 03/05/20 2058 03/07/20 0335 03/08/20 0421 03/09/20 0537  WBC 15.4* 11.5* 9.6 7.7  NEUTROABS 12.2*  --   --   --   HGB 10.5* 8.9* 8.7* 8.1*  HCT 38.9 33.7* 31.5* 29.5*  MCV 73.4* 73.7* 72.1* 71.6*  PLT 456* 356 323 296   Basic Metabolic  Panel: Recent Labs  Lab 03/05/20 2058 03/06/20 0705 03/07/20 0335 03/08/20 0421 03/09/20 0537  NA 138  --  138 136 142  K 3.8  --  4.5 4.5 3.7  CL 97*  --  98 99 104  CO2 29  --  30 29 32  GLUCOSE 204*  --  330* 424* 211*  BUN 8  --  15 17 16   CREATININE 0.71  --  0.70 0.74 0.71  CALCIUM 8.7*  --  8.4* 8.6* 8.1*  MG  --  2.0 2.2 2.2 2.2  PHOS  --  2.2*  --   --   --    GFR: Estimated Creatinine Clearance: 87.9 mL/min (by C-G formula based on SCr of 0.71 mg/dL). Liver Function Tests: Recent Labs  Lab 03/05/20 2058  AST 11*  ALT 10  ALKPHOS 91  BILITOT 0.7  PROT 7.7  ALBUMIN 3.5   No results for input(s): LIPASE, AMYLASE in the last 168 hours. No results for input(s): AMMONIA in the last 168 hours. Coagulation Profile: No results for input(s): INR, PROTIME in the last 168 hours. Cardiac Enzymes: No results for input(s): CKTOTAL, CKMB, CKMBINDEX, TROPONINI in the last 168 hours. BNP (last 3 results) No results for input(s): PROBNP in the last 8760 hours. HbA1C: No results for input(s): HGBA1C in the last 72 hours. CBG: Recent Labs  Lab 03/08/20 1643 03/08/20 2024 03/09/20 0809 03/09/20 1216 03/09/20 1734  GLUCAP 201* 220* 153* 93 92   Lipid Profile: No results for input(s): CHOL, HDL, LDLCALC, TRIG, CHOLHDL, LDLDIRECT in the last 72 hours. Thyroid Function Tests: No results for input(s): TSH, T4TOTAL, FREET4, T3FREE, THYROIDAB in the last 72 hours. Anemia Panel: No results for input(s): VITAMINB12, FOLATE, FERRITIN, TIBC, IRON, RETICCTPCT in the last 72 hours. Sepsis Labs: Recent Labs  Lab 03/05/20 2138 03/05/20 2151 03/06/20 0705  PROCALCITON <0.10  --  <0.10  LATICACIDVEN  --  1.1 1.1    Recent Results (from the past 240 hour(s))  SARS Coronavirus 2 by RT PCR (hospital order, performed in [facility] hospital lab) Nasopharyngeal Nasopharyngeal Swab     Status: None   Collection Time: 03/05/20  9:01 PM   Specimen: Nasopharyngeal Swab  Result Value  Ref Range Status   SARS Coronavirus 2 NEGATIVE NEGATIVE Final    Comment: (NOTE) SARS-CoV-2 target nucleic acids are NOT DETECTED.  The SARS-CoV-2 RNA is generally detectable in upper and lower respiratory specimens during the acute phase of infection. The lowest concentration of SARS-CoV-2 viral copies this assay can detect is 250 copies / mL. A negative result does not preclude SARS-CoV-2 infection and should not be used as the sole basis for treatment or other patient management decisions.  A negative result may occur with improper specimen collection / handling, submission of specimen other than nasopharyngeal swab, presence of viral mutation(s) within the  areas targeted by this assay, and inadequate number of viral copies (<250 copies / mL). A negative result must be combined with clinical observations, patient history, and epidemiological information.  Fact Sheet for Patients:   BoilerBrush.com.cy  Fact Sheet for Healthcare Providers: https://pope.com/  This test is not yet approved or  cleared by the [place] FDA and has been authorized for detection and/or diagnosis of SARS-CoV-2 by FDA under an Emergency Use Authorization (EUA).  This EUA will remain in effect (meaning this test can be used) for the duration of the COVID-19 declaration under Section 564(b)(1) of the Act, 21 U.S.C. section 360bbb-3(b)(1), unless the authorization is terminated or revoked sooner.  Performed at [facility], [address] [place], [place] [address]   Culture, blood (routine x 2)     Status: None (Preliminary result)   Collection Time: 03/05/20  9:51 PM   Specimen: Right Antecubital; Blood  Result Value Ref Range Status   Specimen Description RIGHT ANTECUBITAL  Final   Special Requests   Final    BOTTLES DRAWN AEROBIC AND ANAEROBIC Blood Culture results may not be optimal due to an inadequate volume of blood received in  culture bottles   Culture   Final    NO GROWTH 4 DAYS Performed at [facility], [address] [place], [place] [address]    Report Status PENDING  Incomplete  Culture, blood (routine x 2)     Status: None (Preliminary result)   Collection Time: 03/05/20  9:52 PM   Specimen: BLOOD LEFT HAND  Result Value Ref Range Status   Specimen Description BLOOD LEFT HAND  Final   Special Requests   Final    BOTTLES DRAWN AEROBIC AND ANAEROBIC Blood Culture adequate volume   Culture   Final    NO GROWTH 4 DAYS Performed at [facility], [address], [place], [place] [address]    Report Status PENDING  Incomplete  MRSA PCR Screening     Status: None   Collection Time: 03/06/20  3:24 AM   Specimen: Nasopharyngeal  Result Value Ref Range Status   MRSA by PCR NEGATIVE NEGATIVE Final    Comment:        The GeneXpert MRSA Assay (FDA approved for NASAL specimens only), is one component of a comprehensive MRSA colonization surveillance program. It is not intended to diagnose MRSA infection nor to guide or monitor treatment for MRSA infections. Performed at [facility], [address] [place], [place] [address]   Respiratory Panel by PCR     Status: None   Collection Time: 03/06/20  7:00 AM   Specimen: Nasopharyngeal Swab; Respiratory  Result Value Ref Range Status   Adenovirus NOT DETECTED NOT DETECTED Final   Coronavirus 229E NOT DETECTED NOT DETECTED Final    Comment: (NOTE) The Coronavirus on the Respiratory Panel, DOES NOT test for the novel  Coronavirus (2019 nCoV)    Coronavirus HKU1 NOT DETECTED NOT DETECTED Final   Coronavirus NL63 NOT DETECTED NOT DETECTED Final   Coronavirus OC43 NOT DETECTED NOT DETECTED Final   Metapneumovirus NOT DETECTED NOT DETECTED Final   Rhinovirus / Enterovirus NOT DETECTED NOT DETECTED Final   Influenza A NOT DETECTED NOT DETECTED Final   Influenza B NOT DETECTED NOT DETECTED Final   Parainfluenza Virus 1 NOT  DETECTED NOT DETECTED Final   Parainfluenza Virus 2 NOT DETECTED NOT DETECTED Final   Parainfluenza Virus 3 NOT DETECTED NOT DETECTED Final   Parainfluenza Virus 4 NOT  DETECTED NOT DETECTED Final   Respiratory Syncytial Virus NOT DETECTED NOT DETECTED Final   Bordetella pertussis NOT DETECTED NOT DETECTED Final   Chlamydophila pneumoniae NOT DETECTED NOT DETECTED Final   Mycoplasma pneumoniae NOT DETECTED NOT DETECTED Final    Comment: Performed at [facility], [address], [place], [place] [address]  SARS Coronavirus 2 by RT PCR (hospital order, performed in [facility] hospital lab) Nasopharyngeal Nasopharyngeal Swab     Status: None   Collection Time: 03/06/20  7:00 AM   Specimen: Nasopharyngeal Swab  Result Value Ref Range Status   SARS Coronavirus 2 NEGATIVE NEGATIVE Final    Comment: (NOTE) SARS-CoV-2 target nucleic acids are NOT DETECTED.  The SARS-CoV-2 RNA is generally detectable in upper and lower respiratory specimens during the acute phase of infection. The lowest concentration of SARS-CoV-2 viral copies this assay can detect is 250 copies / mL. A negative result does not preclude SARS-CoV-2 infection and should not be used as the sole basis for treatment or other patient management decisions.  A negative result may occur with improper specimen collection / handling, submission of specimen other than nasopharyngeal swab, presence of viral mutation(s) within the areas targeted by this assay, and inadequate number of viral copies (<250 copies / mL). A negative result must be combined with clinical observations, patient history, and epidemiological information.  Fact Sheet for Patients:   BoilerBrush.com.cy  Fact Sheet for Healthcare Providers: https://pope.com/  This test is not yet approved or  cleared by the [place] FDA and has been authorized for detection and/or diagnosis of SARS-CoV-2 by FDA under an  Emergency Use Authorization (EUA).  This EUA will remain in effect (meaning this test can be used) for the duration of the COVID-19 declaration under Section 564(b)(1) of the Act, 21 U.S.C. section 360bbb-3(b)(1), unless the authorization is terminated or revoked sooner.  Performed at [facility], [address], [place], [place] [address]       Radiology Studies: No results found.   Scheduled Meds: . vitamin C  500 mg Oral Daily  . azithromycin  500 mg Oral Daily  . enoxaparin (LOVENOX) injection  40 mg Subcutaneous Q12H  . gabapentin  600 mg Oral BID  . insulin aspart  0-9 Units Subcutaneous TID WC  . insulin aspart  10 Units Subcutaneous TID WC  . insulin glargine  80 Units Subcutaneous BID  . ipratropium-albuterol  3 mL Nebulization QID  . lisinopril  40 mg Oral Daily  . sodium chloride flush  3 mL Intravenous Q12H  . traZODone  100 mg Oral Daily  . traZODone  300 mg Oral QHS  . venlafaxine XR  75 mg Oral Daily  . zinc sulfate  220 mg Oral Daily   Continuous Infusions: . sodium chloride 250 mL (03/06/20 1637)  . cefTRIAXone (ROCEPHIN)  IV 1 g (03/09/20 1747)     LOS: 4 days     Darlin Priestly, MD [facility] If 7PM-7AM, please contact night-coverage 03/09/2020, 7:06 PM

## 2020-03-09 NOTE — Progress Notes (Signed)
2694- Desat study completed, above 90s during resting state and desats to low 80s when ambulating.

## 2020-03-10 LAB — CULTURE, BLOOD (ROUTINE X 2)
Culture: NO GROWTH
Culture: NO GROWTH
Special Requests: ADEQUATE

## 2020-03-10 LAB — BASIC METABOLIC PANEL
Anion gap: 10 (ref 5–15)
BUN: 10 mg/dL (ref 6–20)
CO2: 28 mmol/L (ref 22–32)
Calcium: 8.4 mg/dL — ABNORMAL LOW (ref 8.9–10.3)
Chloride: 105 mmol/L (ref 98–111)
Creatinine, Ser: 0.58 mg/dL (ref 0.44–1.00)
GFR calc Af Amer: >60 mL/min (ref >60)
GFR calc non Af Amer: >60 mL/min (ref >60)
Glucose, Bld: 78 mg/dL (ref 70–99)
Potassium: 3.9 mmol/L (ref 3.5–5.1)
Sodium: 143 mmol/L (ref 135–145)

## 2020-03-10 LAB — GLUCOSE, CAPILLARY
Glucose-Capillary: 61 mg/dL — ABNORMAL LOW (ref 70–99)
Glucose-Capillary: 118 mg/dL — ABNORMAL HIGH (ref 70–99)
Glucose-Capillary: 118 mg/dL — ABNORMAL HIGH (ref 70–99)

## 2020-03-10 LAB — CBC
HCT: 32.4 % — ABNORMAL LOW (ref 36.0–46.0)
Hemoglobin: 8.5 g/dL — ABNORMAL LOW (ref 12.0–15.0)
MCH: 19.2 pg — ABNORMAL LOW (ref 26.0–34.0)
MCHC: 26.2 g/dL — ABNORMAL LOW (ref 30.0–36.0)
MCV: 73.3 fL — ABNORMAL LOW (ref 80.0–100.0)
Platelets: 337 10*3/uL (ref 150–400)
RBC: 4.42 MIL/uL (ref 3.87–5.11)
RDW: 18.5 % — ABNORMAL HIGH (ref 11.5–15.5)
WBC: 8.1 10*3/uL (ref 4.0–10.5)
nRBC: 0.2 % (ref 0.0–0.2)

## 2020-03-10 LAB — MAGNESIUM: Magnesium: 2.3 mg/dL (ref 1.7–2.4)

## 2020-03-10 MED ORDER — INSULIN GLARGINE 100 UNIT/ML ~~LOC~~ SOLN
70.0000 [IU] | Freq: Two times a day (BID) | SUBCUTANEOUS | Status: DC
  Administered 2020-03-10: 70 [IU] via SUBCUTANEOUS
  Filled 2020-03-10 (×2): qty 0.7

## 2020-03-10 MED ORDER — IPRATROPIUM-ALBUTEROL 0.5-2.5 (3) MG/3ML IN SOLN: 3.0000 mL | Freq: Three times a day (TID) | RESPIRATORY_TRACT | Status: DC

## 2020-03-10 NOTE — Progress Notes (Signed)
Inpatient Diabetes Program Recommendations  AACE/ADA: New Consensus Statement on Inpatient Glycemic Control (2015)  Target Ranges:  Prepandial:   less than 140 mg/dL      Peak postprandial:   less than 180 mg/dL (1-2 hours)      Critically ill patients:  140 - 180 mg/dL   Lab Results  Component Value Date   GLUCAP 118 (H) 03/10/2020   HGBA1C 9.1 (H) 03/06/2020    Review of Glycemic Control Results for Lauren Velazquez, Lauren Velazquez (MRN [id number]) as of 03/10/2020 10:46  Ref. Range 03/09/2020 08:09 03/09/2020 12:16 03/09/2020 17:34 03/09/2020 21:08 03/10/2020 07:15 03/10/2020 07:48  Glucose-Capillary Latest Ref Range: 70 - 99 mg/dL 943 (H) 93 92 276 (H) 61 (L) 118 (H)   Diabetes history: DM2 Outpatient Diabetes medications: Lantus 70 units BID Current orders for Inpatient glycemic control: Lantus 70 units BID + Novolog 10 units tid meal coverage if eats 50% + Novolog sensitive correction tid  Inpatient Diabetes Program Recommendations:    Noted Lantus decreased to 70 units bid from 80 units bid -Discontinue Novolog meal coverage for now & adjust back if needed Secure chat sent to Dr. Fran Lowes.  Thank you, Billy Fischer. Tanganika Barradas, RN, MSN, CDE  Diabetes Coordinator Inpatient Glycemic Control Team Team Pager [phone] (8am-5pm) 03/10/2020 10:49 AM

## 2020-03-10 NOTE — Progress Notes (Signed)
Pt scored a 3 on the RT protocol assessment. Her breath sounds are diminished. The nebulizer treatments are changed from QID to TID.

## 2020-03-10 NOTE — Discharge Summary (Signed)
Physician Discharge Summary   Lauren Velazquez  female DOB: 1960/08/24  GLO:[id number]  PCP: Hillery Aldo, MD  Admit date: 03/05/2020 Discharge date: 03/10/2020  Admitted From: home Disposition:  home CODE STATUS: Full code  Discharge Instructions    Discharge instructions   Complete by: As directed    You had pneumonia, and completed 5 days of IV antibiotics for it.  Now you are able to walk with oxygen level in the 90's, so you can go home to continue to recover.   Dr. Darlin Priestly [facility] Course:  For full details, please see H&P, progress notes, consult notes and ancillary notes.  Briefly,  Lauren Velazquez a 59 y.o.Caucasian femalewith medical history significant of HTN and Dm2 who presented withcough congestion for the past 2 wks.  Pt was seen initially by her primary care provider who prescribed Omnicef, cough syrup and prednisone 6-day taper, but pt didn't improve.  Patient has not been vaccinated with Covid.  Emergency department pt was noted to be hypoxic down to 70%, and pt was admitted to stepdown.  Pt was transferred to the floor the next day.   Acute respiratory failure with hypoxia 2/2 likely bacterial PNA Pt presented with leukocytosis 15.4 and new O2 requirement of 6L.  CXR showed "Multifocal airspace opacities bilaterally, left worse than right, concerning for multifocal pneumonia."  COVID neg, RVP neg.  Procal neg.  CRP 6.9.  Pt was started on ceftriaxone, azithromycin and IV deca (d/c'ed on 7/24 since no COVID).  Pt's symptoms, leukocytosis and O2 requirement all improved with abx treatment, so likely had bacterial PNA.  Pt received 5 days ceftriaxone and azithromycin.  On the day of discharge, pt was able to maintain O2 saturaion in 90's in RA with ambulating.    Marland KitchenHyperlipidemia No statin on home med list  .Hypertension  continues home Lisionpril  .Obesity weight loss with outpatient provider  Diabetes mellitus insulin  dependent BG elevated likely due to initial steroid given on presentation.  Pt also takes a large amount of insulin at home and when given similar amount, BG dropped.  Lantus and SSI were adjusted daily.  Pt was discharged back to home regimen.  Elevated troponin most likely demand ischemia in the setting of hypoxia.  ECG nonacute and no chest pain.  Depression and insomnia  continued home Effexor and trazodone 300 mg nightly and 100 mg daily, per pt request.  Atarax nightly PRN for anxiety and insomnia (did not start benzo).     Chronic pain on chronic opioids Pt reported taking oxycodone 10 mg q6h at home, verified, and continued while inpatient.   Discharge Diagnoses:  Active Problems:   Diabetes mellitus type 2, uncomplicated (HCC)   Hyperlipidemia   Hypertension   Obesity   Acute respiratory failure with hypoxia (HCC)   Suspected COVID-19 virus infection   Elevated troponin    Discharge Instructions:  Allergies as of 03/10/2020      Reactions   Atorvastatin Swelling      Medication List    TAKE these medications   diclofenac sodium 1 % Gel Commonly known as: VOLTAREN Apply 2 g topically 2 (two) times daily as needed (pain).   gabapentin 300 MG capsule Commonly known as: NEURONTIN Take 600 mg by mouth 2 (two) times daily.   insulin aspart 100 UNIT/ML injection Commonly known as: novoLOG Inject into the skin as directed. (based upon blood sugar reading)   Lantus SoloStar 100 UNIT/ML Solostar  Pen Generic drug: insulin glargine Inject 70 Units into the skin 2 (two) times daily.   lisinopril 40 MG tablet Commonly known as: ZESTRIL Take 40 mg by mouth daily.   oxyCODONE-acetaminophen 10-325 MG tablet Commonly known as: PERCOCET Take 1 tablet by mouth every 6 (six) hours as needed for pain.   traZODone 100 MG tablet Commonly known as: DESYREL Take 200 mg by mouth 2 (two) times daily as needed for sleep.   Tussionex Pennkinetic ER 10-8 MG/5ML  Suer Generic drug: chlorpheniramine-HYDROcodone Take 5 mLs by mouth every 12 (twelve) hours as needed for cough.   venlafaxine XR 75 MG 24 hr capsule Commonly known as: EFFEXOR-XR Take 75 mg by mouth daily.   Ventolin HFA 108 (90 Base) MCG/ACT inhaler Generic drug: albuterol Inhale 2 puffs into the lungs every 6 (six) hours as needed for wheezing or shortness of breath.        Follow-up Information    Hillery Aldo, MD. Schedule an appointment as soon as possible for a visit in 1 week(s).   Specialty: Family Medicine Contact information: [address] [place] [address] [phone]               Allergies  Allergen Reactions  . Atorvastatin Swelling     The results of significant diagnostics from this hospitalization (including imaging, microbiology, ancillary and laboratory) are listed below for reference.   Consultations:   Procedures/Studies: DG Chest Portable 1 View  Result Date: 03/05/2020 CLINICAL DATA:  Shortness of breath. EXAM: PORTABLE CHEST 1 VIEW COMPARISON:  None. FINDINGS: There are multifocal hazy airspace opacities bilaterally, greatest within the left upper and left mid lung zone. The heart size is enlarged. Aortic calcifications are noted. There is no pneumothorax. No significant pleural effusion. There is no acute osseous abnormality. IMPRESSION: 1. Multifocal airspace opacities bilaterally, left worse than right, concerning for multifocal pneumonia (viral or bacterial). 2. Cardiomegaly. Electronically Signed   By: Katherine Mantle M.D.   On: 03/05/2020 21:28      Labs: BNP (last 3 results) Recent Labs    03/05/20 2058  BNP 124.7*   Basic Metabolic Panel: Recent Labs  Lab 03/05/20 2058 03/06/20 0705 03/07/20 0335 03/08/20 0421 03/09/20 0537 03/10/20 0522  NA 138  --  138 136 142 143  K 3.8  --  4.5 4.5 3.7 3.9  CL 97*  --  98 99 104 105  CO2 29  --  30 29 32 28  GLUCOSE 204*  --  330* 424* 211* 78  BUN 8  --   15 17 16 10   CREATININE 0.71  --  0.70 0.74 0.71 0.58  CALCIUM 8.7*  --  8.4* 8.6* 8.1* 8.4*  MG  --  2.0 2.2 2.2 2.2 2.3  PHOS  --  2.2*  --   --   --   --    Liver Function Tests: Recent Labs  Lab 03/05/20 2058  AST 11*  ALT 10  ALKPHOS 91  BILITOT 0.7  PROT 7.7  ALBUMIN 3.5   No results for input(s): LIPASE, AMYLASE in the last 168 hours. No results for input(s): AMMONIA in the last 168 hours. CBC: Recent Labs  Lab 03/05/20 2058 03/07/20 0335 03/08/20 0421 03/09/20 0537 03/10/20 0522  WBC 15.4* 11.5* 9.6 7.7 8.1  NEUTROABS 12.2*  --   --   --   --   HGB 10.5* 8.9* 8.7* 8.1* 8.5*  HCT 38.9 33.7* 31.5* 29.5* 32.4*  MCV 73.4* 73.7*  72.1* 71.6* 73.3*  PLT 456* 356 323 296 337   Cardiac Enzymes: No results for input(s): CKTOTAL, CKMB, CKMBINDEX, TROPONINI in the last 168 hours. BNP: Invalid input(s): POCBNP CBG: Recent Labs  Lab 03/09/20 1734 03/09/20 2108 03/10/20 0715 03/10/20 0748 03/10/20 1150  GLUCAP 92 122* 61* 118* 118*   D-Dimer No results for input(s): DDIMER in the last 72 hours. Hgb A1c No results for input(s): HGBA1C in the last 72 hours. Lipid Profile No results for input(s): CHOL, HDL, LDLCALC, TRIG, CHOLHDL, LDLDIRECT in the last 72 hours. Thyroid function studies No results for input(s): TSH, T4TOTAL, T3FREE, THYROIDAB in the last 72 hours.  Invalid input(s): FREET3 Anemia work up No results for input(s): VITAMINB12, FOLATE, FERRITIN, TIBC, IRON, RETICCTPCT in the last 72 hours. Urinalysis    Component Value Date/Time   COLORURINE Yellow 11/19/2014 2137   APPEARANCEUR Cloudy 11/19/2014 2137   LABSPEC 1.026 11/19/2014 2137   PHURINE 5.0 11/19/2014 2137   GLUCOSEU Negative 11/19/2014 2137   HGBUR Negative 11/19/2014 2137   BILIRUBINUR Negative 11/19/2014 2137   KETONESUR Negative 11/19/2014 2137   PROTEINUR 100 mg/dL 34/74/2595 6387   NITRITE Negative 11/19/2014 2137   LEUKOCYTESUR 3+ 11/19/2014 2137   Sepsis Labs Invalid  input(s): PROCALCITONIN,  WBC,  LACTICIDVEN Microbiology Recent Results (from the past 240 hour(s))  SARS Coronavirus 2 by RT PCR (hospital order, performed in [facility] hospital lab) Nasopharyngeal Nasopharyngeal Swab     Status: None   Collection Time: 03/05/20  9:01 PM   Specimen: Nasopharyngeal Swab  Result Value Ref Range Status   SARS Coronavirus 2 NEGATIVE NEGATIVE Final    Comment: (NOTE) SARS-CoV-2 target nucleic acids are NOT DETECTED.  The SARS-CoV-2 RNA is generally detectable in upper and lower respiratory specimens during the acute phase of infection. The lowest concentration of SARS-CoV-2 viral copies this assay can detect is 250 copies / mL. A negative result does not preclude SARS-CoV-2 infection and should not be used as the sole basis for treatment or other patient management decisions.  A negative result may occur with improper specimen collection / handling, submission of specimen other than nasopharyngeal swab, presence of viral mutation(s) within the areas targeted by this assay, and inadequate number of viral copies (<250 copies / mL). A negative result must be combined with clinical observations, patient history, and epidemiological information.  Fact Sheet for Patients:   BoilerBrush.com.cy  Fact Sheet for Healthcare Providers: https://pope.com/  This test is not yet approved or  cleared by the [place] FDA and has been authorized for detection and/or diagnosis of SARS-CoV-2 by FDA under an Emergency Use Authorization (EUA).  This EUA will remain in effect (meaning this test can be used) for the duration of the COVID-19 declaration under Section 564(b)(1) of the Act, 21 U.S.C. section 360bbb-3(b)(1), unless the authorization is terminated or revoked sooner.  Performed at [facility], [address] [place], [place] [address]   Culture, blood (routine x 2)     Status: None   Collection  Time: 03/05/20  9:51 PM   Specimen: Right Antecubital; Blood  Result Value Ref Range Status   Specimen Description RIGHT ANTECUBITAL  Final   Special Requests   Final    BOTTLES DRAWN AEROBIC AND ANAEROBIC Blood Culture results may not be optimal due to an inadequate volume of blood received in culture bottles   Culture   Final    NO GROWTH 5 DAYS Performed at [facility], [address] [place],  [place] [address]    Report Status 03/10/2020 FINAL  Final  Culture, blood (routine x 2)     Status: None   Collection Time: 03/05/20  9:52 PM   Specimen: BLOOD LEFT HAND  Result Value Ref Range Status   Specimen Description BLOOD LEFT HAND  Final   Special Requests   Final    BOTTLES DRAWN AEROBIC AND ANAEROBIC Blood Culture adequate volume   Culture   Final    NO GROWTH 5 DAYS Performed at [facility], [address] [place], [place] [address]    Report Status 03/10/2020 FINAL  Final  MRSA PCR Screening     Status: None   Collection Time: 03/06/20  3:24 AM   Specimen: Nasopharyngeal  Result Value Ref Range Status   MRSA by PCR NEGATIVE NEGATIVE Final    Comment:        The GeneXpert MRSA Assay (FDA approved for NASAL specimens only), is one component of a comprehensive MRSA colonization surveillance program. It is not intended to diagnose MRSA infection nor to guide or monitor treatment for MRSA infections. Performed at [facility], [address] [place], [place] [address]   Respiratory Panel by PCR     Status: None   Collection Time: 03/06/20  7:00 AM   Specimen: Nasopharyngeal Swab; Respiratory  Result Value Ref Range Status   Adenovirus NOT DETECTED NOT DETECTED Final   Coronavirus 229E NOT DETECTED NOT DETECTED Final    Comment: (NOTE) The Coronavirus on the Respiratory Panel, DOES NOT test for the novel  Coronavirus (2019 nCoV)    Coronavirus HKU1 NOT DETECTED NOT DETECTED Final   Coronavirus NL63 NOT DETECTED NOT DETECTED Final    Coronavirus OC43 NOT DETECTED NOT DETECTED Final   Metapneumovirus NOT DETECTED NOT DETECTED Final   Rhinovirus / Enterovirus NOT DETECTED NOT DETECTED Final   Influenza A NOT DETECTED NOT DETECTED Final   Influenza B NOT DETECTED NOT DETECTED Final   Parainfluenza Virus 1 NOT DETECTED NOT DETECTED Final   Parainfluenza Virus 2 NOT DETECTED NOT DETECTED Final   Parainfluenza Virus 3 NOT DETECTED NOT DETECTED Final   Parainfluenza Virus 4 NOT DETECTED NOT DETECTED Final   Respiratory Syncytial Virus NOT DETECTED NOT DETECTED Final   Bordetella pertussis NOT DETECTED NOT DETECTED Final   Chlamydophila pneumoniae NOT DETECTED NOT DETECTED Final   Mycoplasma pneumoniae NOT DETECTED NOT DETECTED Final    Comment: Performed at [facility], [address], [place], [place] [address]  SARS Coronavirus 2 by RT PCR (hospital order, performed in [facility] hospital lab) Nasopharyngeal Nasopharyngeal Swab     Status: None   Collection Time: 03/06/20  7:00 AM   Specimen: Nasopharyngeal Swab  Result Value Ref Range Status   SARS Coronavirus 2 NEGATIVE NEGATIVE Final    Comment: (NOTE) SARS-CoV-2 target nucleic acids are NOT DETECTED.  The SARS-CoV-2 RNA is generally detectable in upper and lower respiratory specimens during the acute phase of infection. The lowest concentration of SARS-CoV-2 viral copies this assay can detect is 250 copies / mL. A negative result does not preclude SARS-CoV-2 infection and should not be used as the sole basis for treatment or other patient management decisions.  A negative result may occur with improper specimen collection / handling, submission of specimen other than nasopharyngeal swab, presence of viral mutation(s) within the areas targeted by this assay, and inadequate number of viral copies (<250 copies / mL). A negative result must be combined with clinical  observations, patient history, and epidemiological information.  Fact Sheet for Patients:    BoilerBrush.com.cy  Fact Sheet for Healthcare Providers: https://pope.com/  This test is not yet approved or  cleared by the [place] FDA and has been authorized for detection and/or diagnosis of SARS-CoV-2 by FDA under an Emergency Use Authorization (EUA).  This EUA will remain in effect (meaning this test can be used) for the duration of the COVID-19 declaration under Section 564(b)(1) of the Act, 21 U.S.C. section 360bbb-3(b)(1), unless the authorization is terminated or revoked sooner.  Performed at [facility], [address] [place], [place] [address]      Total time spend on discharging this patient, including the last patient exam, discussing the hospital stay, instructions for ongoing care as it relates to all pertinent caregivers, as well as preparing the medical discharge records, prescriptions, and/or referrals as applicable, is 40 minutes.    Darlin Priestly, MD  [facility] 03/10/2020, 1:19 PM  If 7PM-7AM, please contact night-coverage

## 2020-07-14 ENCOUNTER — Other Ambulatory Visit: Payer: Self-pay

## 2020-07-14 ENCOUNTER — Encounter: Payer: Self-pay | Admitting: Emergency Medicine

## 2020-07-14 ENCOUNTER — Inpatient Hospital Stay
Admission: EM | Admit: 2020-07-14 | Discharge: 2020-07-22 | DRG: 177 | Disposition: A | Discharge status: Home / Self Care | Payer: BLUE CROSS/BLUE SHIELD | Attending: Hospitalist | Admitting: Hospitalist

## 2020-07-14 DIAGNOSIS — J9601 Acute respiratory failure with hypoxia: Secondary | ICD-10-CM | POA: Diagnosis present

## 2020-07-14 DIAGNOSIS — F32A Depression, unspecified: Secondary | ICD-10-CM | POA: Diagnosis present

## 2020-07-14 DIAGNOSIS — E119 Type 2 diabetes mellitus without complications: Secondary | ICD-10-CM

## 2020-07-14 DIAGNOSIS — J45909 Unspecified asthma, uncomplicated: Secondary | ICD-10-CM | POA: Diagnosis present

## 2020-07-14 DIAGNOSIS — I10 Essential (primary) hypertension: Secondary | ICD-10-CM | POA: Diagnosis present

## 2020-07-14 DIAGNOSIS — G4700 Insomnia, unspecified: Secondary | ICD-10-CM | POA: Diagnosis present

## 2020-07-14 DIAGNOSIS — D509 Iron deficiency anemia, unspecified: Secondary | ICD-10-CM | POA: Diagnosis present

## 2020-07-14 DIAGNOSIS — Z79891 Long term (current) use of opiate analgesic: Secondary | ICD-10-CM

## 2020-07-14 DIAGNOSIS — J1282 Pneumonia due to coronavirus disease 2019: Secondary | ICD-10-CM

## 2020-07-14 DIAGNOSIS — J9600 Acute respiratory failure, unspecified whether with hypoxia or hypercapnia: Secondary | ICD-10-CM

## 2020-07-14 DIAGNOSIS — Z79899 Other long term (current) drug therapy: Secondary | ICD-10-CM

## 2020-07-14 DIAGNOSIS — E66813 Obesity, class 3: Secondary | ICD-10-CM

## 2020-07-14 DIAGNOSIS — Z6841 Body Mass Index (BMI) 40.0 and over, adult: Secondary | ICD-10-CM

## 2020-07-14 DIAGNOSIS — A0839 Other viral enteritis: Secondary | ICD-10-CM | POA: Diagnosis present

## 2020-07-14 DIAGNOSIS — Z794 Long term (current) use of insulin: Secondary | ICD-10-CM

## 2020-07-14 DIAGNOSIS — U071 COVID-19: Principal | ICD-10-CM

## 2020-07-14 DIAGNOSIS — J159 Unspecified bacterial pneumonia: Secondary | ICD-10-CM

## 2020-07-14 DIAGNOSIS — E785 Hyperlipidemia, unspecified: Secondary | ICD-10-CM | POA: Diagnosis present

## 2020-07-14 DIAGNOSIS — G8929 Other chronic pain: Secondary | ICD-10-CM | POA: Diagnosis present

## 2020-07-14 NOTE — ED Triage Notes (Signed)
Pt to ED via EMS from home c/o generalized weakness, abd pain, n/v.  States has felt bad since the 15th and got tested, CDC informed patient she's positive on the 25th.  EMS states patient oxygen was 64% RA on scene, placed on 15L NRB and up to 95%.   PT A&Ox4, chest rise even and unlabored, in NAD at this time, on 15L NRB.

## 2020-07-15 ENCOUNTER — Emergency Department: Payer: BLUE CROSS/BLUE SHIELD

## 2020-07-15 DIAGNOSIS — E785 Hyperlipidemia, unspecified: Secondary | ICD-10-CM | POA: Diagnosis present

## 2020-07-15 DIAGNOSIS — F32A Depression, unspecified: Secondary | ICD-10-CM | POA: Diagnosis present

## 2020-07-15 DIAGNOSIS — J9601 Acute respiratory failure with hypoxia: Secondary | ICD-10-CM | POA: Diagnosis present

## 2020-07-15 DIAGNOSIS — G4700 Insomnia, unspecified: Secondary | ICD-10-CM | POA: Diagnosis present

## 2020-07-15 DIAGNOSIS — I10 Essential (primary) hypertension: Secondary | ICD-10-CM | POA: Diagnosis present

## 2020-07-15 DIAGNOSIS — E119 Type 2 diabetes mellitus without complications: Secondary | ICD-10-CM | POA: Diagnosis present

## 2020-07-15 DIAGNOSIS — Z794 Long term (current) use of insulin: Secondary | ICD-10-CM | POA: Diagnosis not present

## 2020-07-15 DIAGNOSIS — A0839 Other viral enteritis: Secondary | ICD-10-CM | POA: Diagnosis present

## 2020-07-15 DIAGNOSIS — U071 COVID-19: Secondary | ICD-10-CM | POA: Diagnosis present

## 2020-07-15 DIAGNOSIS — Z6841 Body Mass Index (BMI) 40.0 and over, adult: Secondary | ICD-10-CM | POA: Diagnosis not present

## 2020-07-15 DIAGNOSIS — Z79891 Long term (current) use of opiate analgesic: Secondary | ICD-10-CM

## 2020-07-15 DIAGNOSIS — J159 Unspecified bacterial pneumonia: Secondary | ICD-10-CM

## 2020-07-15 DIAGNOSIS — Z79899 Other long term (current) drug therapy: Secondary | ICD-10-CM | POA: Diagnosis not present

## 2020-07-15 DIAGNOSIS — J1282 Pneumonia due to coronavirus disease 2019: Secondary | ICD-10-CM

## 2020-07-15 DIAGNOSIS — J9600 Acute respiratory failure, unspecified whether with hypoxia or hypercapnia: Secondary | ICD-10-CM

## 2020-07-15 DIAGNOSIS — J45909 Unspecified asthma, uncomplicated: Secondary | ICD-10-CM | POA: Diagnosis present

## 2020-07-15 DIAGNOSIS — D509 Iron deficiency anemia, unspecified: Secondary | ICD-10-CM | POA: Diagnosis present

## 2020-07-15 DIAGNOSIS — G8929 Other chronic pain: Secondary | ICD-10-CM | POA: Diagnosis present

## 2020-07-15 LAB — CBC WITH DIFFERENTIAL/PLATELET
Abs Immature Granulocytes: 0.08 10*3/uL — ABNORMAL HIGH (ref 0.00–0.07)
Basophils Absolute: 0 10*3/uL (ref 0.0–0.1)
Basophils Relative: 0 %
Eosinophils Absolute: 0 10*3/uL (ref 0.0–0.5)
Eosinophils Relative: 0 %
HCT: 38.4 % (ref 36.0–46.0)
Hemoglobin: 10.1 g/dL — ABNORMAL LOW (ref 12.0–15.0)
Immature Granulocytes: 2 %
Lymphocytes Relative: 22 %
Lymphs Abs: 1.1 10*3/uL (ref 0.7–4.0)
MCH: 19.3 pg — ABNORMAL LOW (ref 26.0–34.0)
MCHC: 26.3 g/dL — ABNORMAL LOW (ref 30.0–36.0)
MCV: 73.6 fL — ABNORMAL LOW (ref 80.0–100.0)
Monocytes Absolute: 0.5 10*3/uL (ref 0.1–1.0)
Monocytes Relative: 9 %
Neutro Abs: 3.4 10*3/uL (ref 1.7–7.7)
Neutrophils Relative %: 67 %
Platelets: 299 10*3/uL (ref 150–400)
RBC: 5.22 MIL/uL — ABNORMAL HIGH (ref 3.87–5.11)
RDW: 19.1 % — ABNORMAL HIGH (ref 11.5–15.5)
WBC: 5 10*3/uL (ref 4.0–10.5)
nRBC: 1.2 % — ABNORMAL HIGH (ref 0.0–0.2)

## 2020-07-15 LAB — GLUCOSE, CAPILLARY
Glucose-Capillary: 312 mg/dL — ABNORMAL HIGH (ref 70–99)
Glucose-Capillary: 357 mg/dL — ABNORMAL HIGH (ref 70–99)
Glucose-Capillary: 360 mg/dL — ABNORMAL HIGH (ref 70–99)
Glucose-Capillary: 326 mg/dL — ABNORMAL HIGH (ref 70–99)

## 2020-07-15 LAB — CBG MONITORING, ED: Glucose-Capillary: 253 mg/dL — ABNORMAL HIGH (ref 70–99)

## 2020-07-15 LAB — COMPREHENSIVE METABOLIC PANEL
ALT: 14 U/L (ref 0–44)
AST: 25 U/L (ref 15–41)
Albumin: 2.7 g/dL — ABNORMAL LOW (ref 3.5–5.0)
Alkaline Phosphatase: 82 U/L (ref 38–126)
Anion gap: 9 (ref 5–15)
BUN: 9 mg/dL (ref 6–20)
CO2: 35 mmol/L — ABNORMAL HIGH (ref 22–32)
Calcium: 8.5 mg/dL — ABNORMAL LOW (ref 8.9–10.3)
Chloride: 98 mmol/L (ref 98–111)
Creatinine, Ser: 0.65 mg/dL (ref 0.44–1.00)
GFR, Estimated: >60 mL/min (ref >60)
Glucose, Bld: 84 mg/dL (ref 70–99)
Potassium: 4.6 mmol/L (ref 3.5–5.1)
Sodium: 142 mmol/L (ref 135–145)
Total Bilirubin: 1.3 mg/dL — ABNORMAL HIGH (ref 0.3–1.2)
Total Protein: 6.9 g/dL (ref 6.5–8.1)

## 2020-07-15 LAB — FERRITIN: Ferritin: 42 ng/mL (ref 11–307)

## 2020-07-15 LAB — C-REACTIVE PROTEIN: CRP: 13.8 mg/dL — ABNORMAL HIGH (ref <1.0)

## 2020-07-15 LAB — PROCALCITONIN: Procalcitonin: 0.36 ng/mL

## 2020-07-15 LAB — LACTATE DEHYDROGENASE: LDH: 505 U/L — ABNORMAL HIGH (ref 98–192)

## 2020-07-15 LAB — TRIGLYCERIDES: Triglycerides: 131 mg/dL (ref <150)

## 2020-07-15 LAB — FIBRIN DERIVATIVES D-DIMER (ARMC ONLY): Fibrin derivatives D-dimer (ARMC): 1103.62 ng/mL (FEU) — ABNORMAL HIGH (ref 0.00–499.00)

## 2020-07-15 LAB — FIBRINOGEN: Fibrinogen: 619 mg/dL — ABNORMAL HIGH (ref 210–475)

## 2020-07-15 LAB — LACTIC ACID, PLASMA: Lactic Acid, Venous: 1.6 mmol/L (ref 0.5–1.9)

## 2020-07-15 MED ORDER — METHYLPREDNISOLONE SODIUM SUCC 125 MG IJ SOLR
125.0000 mg | INTRAMUSCULAR | Status: AC
  Administered 2020-07-15: 125 mg via INTRAVENOUS
  Filled 2020-07-15: qty 2

## 2020-07-15 MED ORDER — SODIUM CHLORIDE 0.9 % IV SOLN: 2.0000 g | INTRAVENOUS | Status: DC

## 2020-07-15 MED ORDER — ACETAMINOPHEN 325 MG PO TABS: 650.0000 mg | ORAL_TABLET | Freq: Four times a day (QID) | ORAL | Status: DC | PRN

## 2020-07-15 MED ORDER — SODIUM CHLORIDE 0.9 % IV SOLN
2.0000 g | INTRAVENOUS | Status: AC
  Administered 2020-07-15 – 2020-07-17 (×3): 2 g via INTRAVENOUS
  Filled 2020-07-15: qty 2
  Filled 2020-07-15 (×2): qty 20
  Filled 2020-07-15: qty 2

## 2020-07-15 MED ORDER — TRAZODONE HCL 50 MG PO TABS
200.0000 mg | ORAL_TABLET | Freq: Two times a day (BID) | ORAL | Status: DC | PRN
  Administered 2020-07-15 – 2020-07-21 (×8): 200 mg via ORAL
  Filled 2020-07-15 (×2): qty 2
  Filled 2020-07-15: qty 4
  Filled 2020-07-15 (×2): qty 2
  Filled 2020-07-15 (×3): qty 4

## 2020-07-15 MED ORDER — ALBUTEROL SULFATE HFA 108 (90 BASE) MCG/ACT IN AERS
2.0000 | INHALATION_SPRAY | Freq: Four times a day (QID) | RESPIRATORY_TRACT | Status: DC
  Administered 2020-07-15 – 2020-07-22 (×26): 2 via RESPIRATORY_TRACT
  Filled 2020-07-15 (×3): qty 6.7

## 2020-07-15 MED ORDER — SODIUM CHLORIDE 0.9 % IV SOLN
2.0000 g | Freq: Once | INTRAVENOUS | Status: AC
  Administered 2020-07-15: 2 g via INTRAVENOUS
  Filled 2020-07-15: qty 20

## 2020-07-15 MED ORDER — SODIUM CHLORIDE 0.9 % IV SOLN
500.0000 mg | INTRAVENOUS | Status: DC
  Administered 2020-07-15 – 2020-07-17 (×3): 500 mg via INTRAVENOUS
  Filled 2020-07-15 (×4): qty 500

## 2020-07-15 MED ORDER — PREDNISONE 50 MG PO TABS
50.0000 mg | ORAL_TABLET | Freq: Every day | ORAL | Status: DC
  Administered 2020-07-18 – 2020-07-22 (×5): 50 mg via ORAL
  Filled 2020-07-15 (×5): qty 1

## 2020-07-15 MED ORDER — OXYCODONE-ACETAMINOPHEN 10-325 MG PO TABS: 1.0000 | ORAL_TABLET | Freq: Four times a day (QID) | ORAL | Status: DC | PRN

## 2020-07-15 MED ORDER — ADULT MULTIVITAMIN W/MINERALS CH
1.0000 | ORAL_TABLET | Freq: Every day | ORAL | Status: DC
  Administered 2020-07-15 – 2020-07-22 (×8): 1 via ORAL
  Filled 2020-07-15 (×8): qty 1

## 2020-07-15 MED ORDER — SODIUM CHLORIDE 0.9 % IV SOLN
500.0000 mg | Freq: Once | INTRAVENOUS | Status: AC
  Administered 2020-07-15: 500 mg via INTRAVENOUS
  Filled 2020-07-15: qty 500

## 2020-07-15 MED ORDER — ONDANSETRON HCL 4 MG/2ML IJ SOLN
4.0000 mg | Freq: Four times a day (QID) | INTRAMUSCULAR | Status: DC | PRN
  Administered 2020-07-15 – 2020-07-17 (×2): 4 mg via INTRAVENOUS
  Filled 2020-07-15 (×4): qty 2

## 2020-07-15 MED ORDER — SODIUM CHLORIDE 0.9 % IV BOLUS
1000.0000 mL | Freq: Once | INTRAVENOUS | Status: AC
  Administered 2020-07-15: 1000 mL via INTRAVENOUS

## 2020-07-15 MED ORDER — OXYCODONE HCL 5 MG PO TABS
5.0000 mg | ORAL_TABLET | Freq: Four times a day (QID) | ORAL | Status: DC | PRN
  Administered 2020-07-15 – 2020-07-16 (×5): 5 mg via ORAL
  Filled 2020-07-15 (×5): qty 1

## 2020-07-15 MED ORDER — SODIUM CHLORIDE 0.9 % IV SOLN
100.0000 mg | Freq: Every day | INTRAVENOUS | Status: DC
  Administered 2020-07-16 – 2020-07-18 (×3): 100 mg via INTRAVENOUS
  Filled 2020-07-15: qty 100
  Filled 2020-07-15 (×3): qty 20

## 2020-07-15 MED ORDER — INSULIN GLARGINE 100 UNIT/ML SOLOSTAR PEN: 70.0000 [IU] | PEN_INJECTOR | Freq: Two times a day (BID) | SUBCUTANEOUS | Status: DC

## 2020-07-15 MED ORDER — METHYLPREDNISOLONE SODIUM SUCC 125 MG IJ SOLR
0.5000 mg/kg | Freq: Two times a day (BID) | INTRAMUSCULAR | Status: AC
  Administered 2020-07-15 – 2020-07-17 (×6): 50 mg via INTRAVENOUS
  Filled 2020-07-15 (×7): qty 2

## 2020-07-15 MED ORDER — LINAGLIPTIN 5 MG PO TABS
5.0000 mg | ORAL_TABLET | Freq: Every day | ORAL | Status: DC
  Administered 2020-07-15 – 2020-07-19 (×5): 5 mg via ORAL
  Filled 2020-07-15 (×8): qty 1

## 2020-07-15 MED ORDER — LISINOPRIL 20 MG PO TABS
40.0000 mg | ORAL_TABLET | Freq: Every day | ORAL | Status: DC
  Administered 2020-07-15 – 2020-07-22 (×8): 40 mg via ORAL
  Filled 2020-07-15 (×8): qty 2

## 2020-07-15 MED ORDER — ONDANSETRON HCL 4 MG/2ML IJ SOLN
4.0000 mg | Freq: Once | INTRAMUSCULAR | Status: AC
  Administered 2020-07-15: 4 mg via INTRAVENOUS
  Filled 2020-07-15: qty 2

## 2020-07-15 MED ORDER — ENOXAPARIN SODIUM 60 MG/0.6ML ~~LOC~~ SOLN
0.5000 mg/kg | SUBCUTANEOUS | Status: DC
  Administered 2020-07-16 – 2020-07-22 (×7): 50 mg via SUBCUTANEOUS
  Filled 2020-07-15 (×9): qty 0.6

## 2020-07-15 MED ORDER — INSULIN GLARGINE 100 UNIT/ML ~~LOC~~ SOLN
70.0000 [IU] | Freq: Two times a day (BID) | SUBCUTANEOUS | Status: DC
  Administered 2020-07-15 (×2): 70 [IU] via SUBCUTANEOUS
  Filled 2020-07-15 (×4): qty 0.7

## 2020-07-15 MED ORDER — HYDROCOD POLST-CPM POLST ER 10-8 MG/5ML PO SUER
5.0000 mL | Freq: Two times a day (BID) | ORAL | Status: DC | PRN
  Administered 2020-07-16 – 2020-07-21 (×6): 5 mL via ORAL
  Filled 2020-07-15 (×6): qty 5

## 2020-07-15 MED ORDER — GUAIFENESIN-DM 100-10 MG/5ML PO SYRP
10.0000 mL | ORAL_SOLUTION | ORAL | Status: DC | PRN
  Administered 2020-07-17 (×2): 10 mL via ORAL
  Filled 2020-07-15 (×2): qty 10

## 2020-07-15 MED ORDER — SODIUM CHLORIDE 0.9 % IV SOLN: 500.0000 mg | INTRAVENOUS | Status: DC

## 2020-07-15 MED ORDER — ENOXAPARIN SODIUM 40 MG/0.4ML ~~LOC~~ SOLN: 40.0000 mg | SUBCUTANEOUS | Status: DC

## 2020-07-15 MED ORDER — INSULIN ASPART 100 UNIT/ML ~~LOC~~ SOLN
0.0000 [IU] | Freq: Every day | SUBCUTANEOUS | Status: DC
  Administered 2020-07-15: 4 [IU] via SUBCUTANEOUS
  Administered 2020-07-18: 22:00:00 3 [IU] via SUBCUTANEOUS
  Administered 2020-07-19: 21:00:00 2 [IU] via SUBCUTANEOUS
  Administered 2020-07-20 – 2020-07-21 (×2): 4 [IU] via SUBCUTANEOUS
  Filled 2020-07-15 (×5): qty 1

## 2020-07-15 MED ORDER — ONDANSETRON HCL 4 MG PO TABS
4.0000 mg | ORAL_TABLET | Freq: Four times a day (QID) | ORAL | Status: DC | PRN
  Administered 2020-07-15: 4 mg via ORAL
  Filled 2020-07-15: qty 1

## 2020-07-15 MED ORDER — SODIUM CHLORIDE 0.9 % IV SOLN
200.0000 mg | Freq: Once | INTRAVENOUS | Status: AC
  Administered 2020-07-15: 200 mg via INTRAVENOUS
  Filled 2020-07-15: qty 200

## 2020-07-15 MED ORDER — OXYCODONE-ACETAMINOPHEN 5-325 MG PO TABS
1.0000 | ORAL_TABLET | Freq: Four times a day (QID) | ORAL | Status: DC | PRN
  Administered 2020-07-15 – 2020-07-16 (×4): 1 via ORAL
  Filled 2020-07-15 (×4): qty 1

## 2020-07-15 MED ORDER — INSULIN ASPART 100 UNIT/ML ~~LOC~~ SOLN
6.0000 [IU] | Freq: Three times a day (TID) | SUBCUTANEOUS | Status: DC
  Administered 2020-07-15 (×2): 6 [IU] via SUBCUTANEOUS
  Filled 2020-07-15 (×2): qty 1

## 2020-07-15 MED ORDER — INSULIN ASPART 100 UNIT/ML ~~LOC~~ SOLN
0.0000 [IU] | Freq: Three times a day (TID) | SUBCUTANEOUS | Status: DC
  Administered 2020-07-15 (×2): 20 [IU] via SUBCUTANEOUS
  Administered 2020-07-16 (×2): 15 [IU] via SUBCUTANEOUS
  Administered 2020-07-16 – 2020-07-17 (×3): 7 [IU] via SUBCUTANEOUS
  Administered 2020-07-17: 4 [IU] via SUBCUTANEOUS
  Administered 2020-07-18 (×2): 15 [IU] via SUBCUTANEOUS
  Administered 2020-07-18: 11 [IU] via SUBCUTANEOUS
  Administered 2020-07-19: 7 [IU] via SUBCUTANEOUS
  Administered 2020-07-19: 08:00:00 3 [IU] via SUBCUTANEOUS
  Administered 2020-07-20 – 2020-07-21 (×2): 11 [IU] via SUBCUTANEOUS
  Administered 2020-07-22: 13:00:00 3 [IU] via SUBCUTANEOUS
  Filled 2020-07-15 (×16): qty 1

## 2020-07-15 MED ORDER — VENLAFAXINE HCL ER 75 MG PO CP24
75.0000 mg | ORAL_CAPSULE | Freq: Every day | ORAL | Status: DC
  Administered 2020-07-15 – 2020-07-22 (×8): 75 mg via ORAL
  Filled 2020-07-15 (×9): qty 1

## 2020-07-15 MED ORDER — GABAPENTIN 300 MG PO CAPS
300.0000 mg | ORAL_CAPSULE | Freq: Two times a day (BID) | ORAL | Status: DC
  Administered 2020-07-15 – 2020-07-22 (×16): 300 mg via ORAL
  Filled 2020-07-15 (×16): qty 1

## 2020-07-15 NOTE — ED Notes (Signed)
Pt O2 increased to 14L on HFNC at this time.

## 2020-07-15 NOTE — ED Notes (Signed)
Pharmacy messaged to verify pt meds at this time

## 2020-07-15 NOTE — H&P (Signed)
History and Physical    Jyla Hopf PFX:[id number] DOB: 1960/09/17 DOA: 07/14/2020  PCP: Hillery Aldo, MD   Patient coming from: Home  I have personally briefly reviewed patient's old medical records in [facility]  Chief Complaint: Shortness of breath, Covid positive  HPI: Sanai Frick is a 59 y.o. female with medical history significant for DM, HTN, depression, morbid obesity, chronic opiate use, diagnosed with Covid on 11/25 was brought in by EMS with increased shortness of breath with O2 sat 64% on room air, placed on nonrebreather.  Patient has been having 2 weeks of generalized weakness, abdominal pain, vomiting and diarrhea, loss of appetite and decreased oral intake.  He denies fever chills or chest pain.  Tested positive for Covid on 11/25 at an outside facility and unable to follow-up results and patient refusing repeat Covid test.  She is unvaccinated.  Against Covid. ED Course: On arrival, she was afebrile BP 115/58 pulse 73 respirations 20 O2 sat 95% on room air.  Blood work significant for elevated inflammatory biomarkers including fibrinogen 698, LDH 505, D-dimer 1103.  WBC normal at 5 lactic acid 1.6.  CMP mostly unremarkable.  Hemoglobin 10.  Procalcitonin 0.36.  Chest x-ray showed findings consistent with COVID-19 pneumonia. EKG as reviewed by me : Sinus rhythm with no acute ST-T wave changes Patient refused to be swabbed for Covid as she had a recent swab on 11/25 Review of Systems: As per HPI otherwise all other systems on review of systems negative.    Past Medical History:  Diagnosis Date  . Anemia    not currently under treatment  . Anxiety   . Asthma    during allergy season  . Diabetes mellitus without complication (HCC)   . Hypertension   . Vertigo     Past Surgical History:  Procedure Laterality Date  . CARPAL TUNNEL RELEASE Right 2015  . KNEE ARTHROSCOPY Left 05/17/2018   Procedure: ARTHROSCOPY KNEE WITH LATERAL RELEASE, PARTIAL SYNOVECTOMY;   Surgeon: Kennedy Bucker, MD;  Location: [facility] ORS;  Service: Orthopedics;  Laterality: Left;  . KNEE ARTHROSCOPY WITH LATERAL RELEASE Right 12/28/2017   Procedure: KNEE ARTHROSCOPY WITH LATERAL RELEASE;  Surgeon: Kennedy Bucker, MD;  Location: [facility] ORS;  Service: Orthopedics;  Laterality: Right;  . TIBIAL TUBERCLERPLASTY Bilateral 06/01/2017   Procedure: TIBIAL TUBERCLE SPUR EXCISION;  Surgeon: Kennedy Bucker, MD;  Location: [facility] ORS;  Service: Orthopedics;  Laterality: Bilateral;     reports that she has never smoked. She has never used smokeless tobacco. She reports that she does not drink alcohol and does not use drugs.  Allergies  Allergen Reactions  . Atorvastatin Swelling    History reviewed. No pertinent family history.    Prior to Admission medications   Medication Sig Start Date End Date Taking? Authorizing Provider  chlorpheniramine-HYDROcodone (TUSSIONEX PENNKINETIC ER) 10-8 MG/5ML SUER Take 5 mLs by mouth every 12 (twelve) hours as needed for cough.    [provider]  diclofenac sodium (VOLTAREN) 1 % GEL Apply 2 g topically 2 (two) times daily as needed (pain).     [provider]  gabapentin (NEURONTIN) 300 MG capsule Take 600 mg by mouth 2 (two) times daily.     [provider]  insulin aspart (NOVOLOG) 100 UNIT/ML injection Inject into the skin as directed. (based upon blood sugar reading)    [provider]  LANTUS SOLOSTAR 100 UNIT/ML Solostar Pen Inject 70 Units into the skin 2 (two) times daily.     [provider]  lisinopril (ZESTRIL) 40 MG tablet Take 40 mg by mouth daily.     [provider]  oxyCODONE-acetaminophen (PERCOCET) 10-325 MG tablet Take 1 tablet by mouth every 6 (six) hours as needed for pain.  04/12/17   [provider]  traZODone (DESYREL) 100 MG tablet Take 200 mg by mouth 2 (two) times daily as needed for sleep.     [provider]  venlafaxine XR (EFFEXOR-XR) 75 MG 24 hr capsule Take  75 mg by mouth daily.     [provider]  VENTOLIN HFA 108 (90 Base) MCG/ACT inhaler Inhale 2 puffs into the lungs every 6 (six) hours as needed for wheezing or shortness of breath.     [provider]    Physical Exam: Vitals:   07/14/20 2346 07/15/20 0000 07/15/20 0200 07/15/20 0230  BP:  (!) 122/53 (!) 144/38 (!) 145/71  Pulse:  64 69 69  Resp:  19 (!) 21 19  Temp:      TempSrc:      SpO2:  94% 96% 97%  Weight: 99.8 kg     Height: 5\' 2"  (1.575 m)        Vitals:   07/14/20 2346 07/15/20 0000 07/15/20 0200 07/15/20 0230  BP:  (!) 122/53 (!) 144/38 (!) 145/71  Pulse:  64 69 69  Resp:  19 (!) 21 19  Temp:      TempSrc:      SpO2:  94% 96% 97%  Weight: 99.8 kg     Height: 5\' 2"  (1.575 m)         Constitutional: Alert and oriented x 3 .  Increased work of breathing, speaking in short sentences HEENT:      Head: Normocephalic and atraumatic.         Eyes: PERLA, EOMI, Conjunctivae are normal. Sclera is non-icteric.       Mouth/Throat:  Not done in the setting of Covid      Neck: Supple with no signs of meningismus. Cardiovascular: Regular rate and rhythm. No murmurs, gallops, or rubs. 2+ symmetrical distal pulses are present . No JVD. No LE edema Respiratory: Respiratory effort increased with coarse breath sounds and wheezes throughout both lung fields Gastrointestinal: Soft, non tender, and non distended with positive bowel sounds. No rebound or guarding. Genitourinary: No CVA tenderness. Musculoskeletal: Nontender with normal range of motion in all extremities. No cyanosis, or erythema of extremities. Neurologic:  Face is symmetric. Moving all extremities. No gross focal neurologic deficits . Skin: Skin is warm, dry.  No rash or ulcers Psychiatric: Mood and affect are normal    Labs on Admission: I have personally reviewed following labs and imaging studies  CBC: Recent Labs  Lab 07/15/20 0058  WBC 5.0  NEUTROABS 3.4  HGB 10.1*  HCT 38.4  MCV  73.6*  PLT 299   Basic Metabolic Panel: Recent Labs  Lab 07/15/20 0058  NA 142  K 4.6  CL 98  CO2 35*  GLUCOSE 84  BUN 9  CREATININE 0.65  CALCIUM 8.5*   GFR: Estimated Creatinine Clearance: 85.7 mL/min (by C-G formula based on SCr of 0.65 mg/dL). Liver Function Tests: Recent Labs  Lab 07/15/20 0058  AST 25  ALT 14  ALKPHOS 82  BILITOT 1.3*  PROT 6.9  ALBUMIN 2.7*   No results for input(s): LIPASE, AMYLASE in the last 168 hours. No results for input(s): AMMONIA in the last 168 hours. Coagulation Profile: No results for input(s): INR, PROTIME in  the last 168 hours. Cardiac Enzymes: No results for input(s): CKTOTAL, CKMB, CKMBINDEX, TROPONINI in the last 168 hours. BNP (last 3 results) No results for input(s): PROBNP in the last 8760 hours. HbA1C: No results for input(s): HGBA1C in the last 72 hours. CBG: No results for input(s): GLUCAP in the last 168 hours. Lipid Profile: Recent Labs    07/15/20 0058  TRIG 131   Thyroid Function Tests: No results for input(s): TSH, T4TOTAL, FREET4, T3FREE, THYROIDAB in the last 72 hours. Anemia Panel: Recent Labs    07/15/20 0058  FERRITIN 42   Urine analysis:    Component Value Date/Time   COLORURINE Yellow 11/19/2014 2137   APPEARANCEUR Cloudy 11/19/2014 2137   LABSPEC 1.026 11/19/2014 2137   PHURINE 5.0 11/19/2014 2137   GLUCOSEU Negative 11/19/2014 2137   HGBUR Negative 11/19/2014 2137   BILIRUBINUR Negative 11/19/2014 2137   KETONESUR Negative 11/19/2014 2137   PROTEINUR 100 mg/dL 53/29/9242 6834   NITRITE Negative 11/19/2014 2137   LEUKOCYTESUR 3+ 11/19/2014 2137    Radiological Exams on Admission: DG Chest Port 1 View  Result Date: 07/15/2020 CLINICAL DATA:  COVID pneumonia, hypoxia EXAM: PORTABLE CHEST 1 VIEW COMPARISON:  03/05/2020 FINDINGS: Moderate patchy diffuse pulmonary infiltrate has developed within the lungs bilaterally, likely infectious or inflammatory in nature. Low lung volumes noted,  however, pulmonary insufflation is symmetric. No pneumothorax or pleural effusion. Cardiac size within normal limits. No acute bone abnormality. IMPRESSION: Multifocal patchy pulmonary infiltrates diffusely throughout the lungs, likely infectious or inflammatory in the acute setting and compatible with given history of COVID-19 pneumonia. Low lung volumes. Electronically Signed   By: Helyn Numbers MD   On: 07/15/2020 00:42     Assessment/Plan Principal Problem:   Pneumonia due to COVID-19 virus Active Problems:   Acute respiratory failure due to COVID-19 [facility])   Diabetes mellitus type 2, uncomplicated (HCC)   Hypertension   Obesity, Class III, BMI 40-49.9 (morbid obesity) (HCC)   Chronic prescription opiate use   Acute respiratory failure with hypoxia Suspect COVID-19 pneumonia with possible superimposed bacterial pneumonia -Patient with shortness of breath, recently Covid positive positive chest x-ray with O2 sat 64% on arrival of EMS, now requiring HFNC at 15 L to maintain sats in the low 90s -Outpatient Covid positive test on 11/25 but unconfirmed but patient has elevated inflammatory biomarkers and typical chest x-ray -Procalcitonin elevated at 136 -Of note patient was hospitalized in July with similar findings on chest x-ray and ruled out for Covid at that time and treated for bacterial pneumonia.  She had a similar presentation -We will treat as Covid with secondary bacterial pneumonia -Remdesivir, albuterol, steroids, antitussives and vitamins -Rocephin and azithromycin -Risk of severe disease if indeed Covid positive -Continue to encourage repeat nasal swab for to get outside results  Covid gastroenteritis -Patient with nausea vomiting and diarrhea in the past 2 weeks -Supportive care  .Hyperlipidemia -Not currently on statin  .Hypertension  -Continue home lisinopril  .Morbid obesity -BMI 47 complicated factor to prognosis and care  Diabetes mellitus insulin  dependent -Continue basal insulin with sliding scale coverage as well as premeal insulin .  Depression and insomnia  -Continue Effexor and trazodone   Chronic pain on chronic opioids -Continue home steroids  DVT prophylaxis: Lovenox  Code Status: full code  Family Communication:  none  Disposition Plan: Back to previous home environment Consults called: none  Status:At the time of admission, it appears that the appropriate admission status for this patient is INPATIENT. This  is judged to be reasonable and necessary in order to provide the required intensity of service to ensure the patient's safety given the presenting symptoms, physical exam findings, and initial radiographic and laboratory data in the context of their  Comorbid conditions.   Patient requires inpatient status due to high intensity of service, high risk for further deterioration and high frequency of surveillance required.   I certify that at the point of admission it is my clinical judgment that the patient will require inpatient hospital care spanning beyond 2 midnights     Andris Baumann MD [facility]     07/15/2020, 3:03 AM

## 2020-07-15 NOTE — ED Notes (Signed)
Pt refusing Covid swab at this time.  

## 2020-07-15 NOTE — Progress Notes (Addendum)
Patient keeps removing her continuous pulse ox and cardiac monitor leads.   Explained importance of monitoring Heart rate  and O2.      1810- patient had removed her oxygen and O2 had dropped down to 46%.  Replaced O2 and pulse ox increased to 88%.  Patient drops down below 88% while talking/eating.  Encouraged patient to take deep breaths.  Facilities manager given

## 2020-07-15 NOTE — ED Provider Notes (Signed)
[facility] Emergency Department Provider Note  ____________________________________________  Time seen: Approximately 1:53 AM  I have reviewed the triage vital signs and the nursing notes.   HISTORY  Chief Complaint Covid Positive    HPI Lauren Velazquez is a 58 y.o. female with a history of hypertension diabetes who comes the ED complaining of multiple symptoms that are gradual onset and worsening over the past 2 weeks, including generalized weakness, generalized abdominal pain, nausea vomiting, loss of appetite and decreased oral intake, shortness of breath.  Denies focal pain complaints, no neck pain or stiffness, no fever.  No syncope or falls.  EMS report room air oxygen saturation of 64% on scene, placed on nonrebreather cannula.      Past Medical History:  Diagnosis Date   Anemia    not currently under treatment   Anxiety    Asthma    during allergy season   Diabetes mellitus without complication (HCC)    Hypertension    Vertigo      Patient Active Problem List   Diagnosis Date Noted   Suspected COVID-19 virus infection 03/06/2020   Elevated troponin 03/06/2020   Acute respiratory failure with hypoxia (HCC) 03/05/2020   Diabetes mellitus type 2, uncomplicated (HCC) 05/30/2014   Hyperlipidemia 05/30/2014   Hypertension 05/30/2014   Obesity 05/30/2014   Vertigo 05/30/2014   Carpal tunnel syndrome on both sides 12/27/2013     Past Surgical History:  Procedure Laterality Date   CARPAL TUNNEL RELEASE Right 2015   KNEE ARTHROSCOPY Left 05/17/2018   Procedure: ARTHROSCOPY KNEE WITH LATERAL RELEASE, PARTIAL SYNOVECTOMY;  Surgeon: Kennedy Bucker, MD;  Location: [facility] ORS;  Service: Orthopedics;  Laterality: Left;   KNEE ARTHROSCOPY WITH LATERAL RELEASE Right 12/28/2017   Procedure: KNEE ARTHROSCOPY WITH LATERAL RELEASE;  Surgeon: Kennedy Bucker, MD;  Location: [facility] ORS;  Service: Orthopedics;  Laterality: Right;   TIBIAL  TUBERCLERPLASTY Bilateral 06/01/2017   Procedure: TIBIAL TUBERCLE SPUR EXCISION;  Surgeon: Kennedy Bucker, MD;  Location: [facility] ORS;  Service: Orthopedics;  Laterality: Bilateral;     Prior to Admission medications   Medication Sig Start Date End Date Taking? Authorizing Provider  chlorpheniramine-HYDROcodone (TUSSIONEX PENNKINETIC ER) 10-8 MG/5ML SUER Take 5 mLs by mouth every 12 (twelve) hours as needed for cough.    [provider]  diclofenac sodium (VOLTAREN) 1 % GEL Apply 2 g topically 2 (two) times daily as needed (pain).     [provider]  gabapentin (NEURONTIN) 300 MG capsule Take 600 mg by mouth 2 (two) times daily.     [provider]  insulin aspart (NOVOLOG) 100 UNIT/ML injection Inject into the skin as directed. (based upon blood sugar reading)    [provider]  LANTUS SOLOSTAR 100 UNIT/ML Solostar Pen Inject 70 Units into the skin 2 (two) times daily.     [provider]  lisinopril (ZESTRIL) 40 MG tablet Take 40 mg by mouth daily.     [provider]  oxyCODONE-acetaminophen (PERCOCET) 10-325 MG tablet Take 1 tablet by mouth every 6 (six) hours as needed for pain.  04/12/17   [provider]  traZODone (DESYREL) 100 MG tablet Take 200 mg by mouth 2 (two) times daily as needed for sleep.     [provider]  venlafaxine XR (EFFEXOR-XR) 75 MG 24 hr capsule Take 75 mg by mouth daily.     [provider]  VENTOLIN HFA 108 (90 Base) MCG/ACT inhaler Inhale 2 puffs into the lungs every 6 (  six) hours as needed for wheezing or shortness of breath.     [provider]     Allergies Atorvastatin   History reviewed. No pertinent family history.  Social History Social History   Tobacco Use   Smoking status: Never Smoker   Smokeless tobacco: Never Used  Building services engineer Use: Never used  Substance Use Topics   Alcohol use: No   Drug use: No    Review of Systems  Constitutional:    No fever positive chills.  ENT:   No sore throat. No rhinorrhea. Cardiovascular:   No chest pain or syncope. Respiratory:   Positive shortness of breath and nonproductive cough. Gastrointestinal: Positive as above for abdominal pain and vomiting, no constipation Musculoskeletal:   Negative for focal pain or swelling All other systems reviewed and are negative except as documented above in ROS and HPI.  ____________________________________________   PHYSICAL EXAM:  VITAL SIGNS: ED Triage Vitals  Enc Vitals Group     BP 07/14/20 2345 (!) 115/58     Pulse Rate 07/14/20 2345 73     Resp 07/14/20 2345 20     Temp 07/14/20 2345 98.8 F (37.1 C)     Temp Source 07/14/20 2345 Oral     SpO2 07/14/20 2345 95 %     Weight 07/14/20 2346 220 lb (99.8 kg)     Height 07/14/20 2346 5\' 2"  (1.575 m)     Head Circumference --      Peak Flow --      Pain Score 07/14/20 2346 8     Pain Loc --      Pain Edu? --      Excl. in GC? --     Vital signs reviewed, nursing assessments reviewed.   Constitutional:   Alert and oriented. Non-toxic appearance. Eyes:   Conjunctivae are normal. EOMI. PERRL. ENT      Head:   Normocephalic and atraumatic.      Nose:   Normal.      Mouth/Throat:   Dry mucous membranes      Neck:   No meningismus. Full ROM. Hematological/Lymphatic/Immunilogical:   No cervical lymphadenopathy. Cardiovascular:   RRR. Symmetric bilateral radial and DP pulses.  No murmurs. Cap refill less than 2 seconds. Respiratory:   Normal respiratory effort without tachypnea/retractions.  Diffuse bilateral basilar crackles Gastrointestinal:   Soft and nontender. Non distended. There is no CVA tenderness.  No rebound, rigidity, or guarding.  Musculoskeletal:   Normal range of motion in all extremities. No joint effusions.  No lower extremity tenderness.  No edema. Neurologic:   Normal speech and language.  Motor grossly intact. No acute focal neurologic deficits are appreciated.  Skin:     Skin is warm, dry and intact. No rash noted.  No petechiae, purpura, or bullae.  ____________________________________________    LABS (pertinent positives/negatives) (all labs ordered are listed, but only abnormal results are displayed) Labs Reviewed  CBC WITH DIFFERENTIAL/PLATELET - Abnormal; Notable for the following components:      Result Value   RBC 5.22 (*)    Hemoglobin 10.1 (*)    MCV 73.6 (*)    MCH 19.3 (*)    MCHC 26.3 (*)    RDW 19.1 (*)    nRBC 1.2 (*)    Abs Immature Granulocytes 0.08 (*)    All other components within normal limits  COMPREHENSIVE METABOLIC PANEL - Abnormal; Notable for the following components:   CO2 35 (*)  Calcium 8.5 (*)    Albumin 2.7 (*)    Total Bilirubin 1.3 (*)    All other components within normal limits  FIBRIN DERIVATIVES D-DIMER ([facility] ONLY) - Abnormal; Notable for the following components:   Fibrin derivatives D-dimer ([facility]) 1,103.62 (*)    All other components within normal limits  LACTATE DEHYDROGENASE - Abnormal; Notable for the following components:   LDH 505 (*)    All other components within normal limits  FIBRINOGEN - Abnormal; Notable for the following components:   Fibrinogen 619 (*)    All other components within normal limits  RESP PANEL BY RT-PCR (FLU A&B, COVID) ARPGX2  LACTIC ACID, PLASMA  FERRITIN  TRIGLYCERIDES  LACTIC ACID, PLASMA  C-REACTIVE PROTEIN  PROCALCITONIN   ____________________________________________   EKG  Interpreted by me Sinus rhythm rate of 71, normal axis and intervals.  Poor R wave progression.  Normal ST segments and T waves.  ____________________________________________    RADIOLOGY  DG Chest Port 1 View  Result Date: 07/15/2020 CLINICAL DATA:  COVID pneumonia, hypoxia EXAM: PORTABLE CHEST 1 VIEW COMPARISON:  03/05/2020 FINDINGS: Moderate patchy diffuse pulmonary infiltrate has developed within the lungs bilaterally, likely infectious or inflammatory in nature. Low lung  volumes noted, however, pulmonary insufflation is symmetric. No pneumothorax or pleural effusion. Cardiac size within normal limits. No acute bone abnormality. IMPRESSION: Multifocal patchy pulmonary infiltrates diffusely throughout the lungs, likely infectious or inflammatory in the acute setting and compatible with given history of COVID-19 pneumonia. Low lung volumes. Electronically Signed   By: Helyn Numbers MD   On: 07/15/2020 00:42    ____________________________________________   PROCEDURES .Critical Care Performed by: Sharman Cheek, MD Authorized by: Sharman Cheek, MD   Critical care provider statement:    Critical care time (minutes):  35   Critical care time was exclusive of:  Separately billable procedures and treating other patients   Critical care was necessary to treat or prevent imminent or life-threatening deterioration of the following conditions:  Respiratory failure   Critical care was time spent personally by me on the following activities:  Development of treatment plan with patient or surrogate, discussions with consultants, evaluation of patient's response to treatment, examination of patient, obtaining history from patient or surrogate, ordering and performing treatments and interventions, ordering and review of laboratory studies, ordering and review of radiographic studies, pulse oximetry, re-evaluation of patient's condition and review of old charts    ____________________________________________  DIFFERENTIAL DIAGNOSIS   COVID-19 pneumonia, pleural effusion, pulmonary edema, bacterial pneumonia  CLINICAL IMPRESSION / ASSESSMENT AND PLAN / ED COURSE  Medications ordered in the ED: Medications  azithromycin (ZITHROMAX) 500 mg in sodium chloride 0.9 % 250 mL IVPB (500 mg Intravenous New Bag/Given 07/15/20 0151)  gabapentin (NEURONTIN) capsule 300 mg (300 mg Oral Given 07/15/20 0150)  insulin glargine (LANTUS) Solostar Pen 70 Units (has no administration  in time range)  lisinopril (ZESTRIL) tablet 40 mg (has no administration in time range)  traZODone (DESYREL) tablet 200 mg (200 mg Oral Given 07/15/20 0150)  venlafaxine XR (EFFEXOR-XR) 24 hr capsule 75 mg (has no administration in time range)  oxyCODONE-acetaminophen (PERCOCET/ROXICET) 5-325 MG per tablet 1 tablet (1 tablet Oral Given 07/15/20 0217)    And  oxyCODONE (Oxy IR/ROXICODONE) immediate release tablet 5 mg (5 mg Oral Given 07/15/20 0217)  methylPREDNISolone sodium succinate (SOLU-MEDROL) 125 mg/2 mL injection 125 mg (125 mg Intravenous Given 07/15/20 0100)  cefTRIAXone (ROCEPHIN) 2 g in sodium chloride 0.9 % 100 mL IVPB ( Intravenous  Stopped 07/15/20 0134)  sodium chloride 0.9 % bolus 1,000 mL (1,000 mLs Intravenous New Bag/Given 07/15/20 0133)  ondansetron (ZOFRAN) injection 4 mg (4 mg Intravenous Given 07/15/20 0220)    Pertinent labs & imaging results that were available during my care of the patient were reviewed by me and considered in my medical decision making (see chart for details).  Lauren Velazquez was evaluated in Emergency Department on 07/15/2020 for the symptoms described in the history of present illness. She was evaluated in the context of the global COVID-19 pandemic, which necessitated consideration that the patient might be at risk for infection with the SARS-CoV-2 virus that causes COVID-19. Institutional protocols and algorithms that pertain to the evaluation of patients at risk for COVID-19 are in a state of rapid change based on information released by regulatory bodies including the CDC and federal and state organizations. These policies and algorithms were followed during the patient's care in the ED.     Clinical Course as of Jul 15 250  Wed Jul 15, 2020  0040 Patient reports with reported recent positive Covid test, worsening shortness of breath, loss of appetite vomiting and diarrhea progressing over the last 2 weeks, particular in the last few days.  Hypoxic to  64% on room air, requiring nonrebreather oxygen.  Chest x-ray image viewed by me which shows diffuse multifocal infiltrates consistent with Covid pneumonia, no lobar consolidation.  She is not septic.  Will give IV fluids for hydration, Solu-Medrol, plan to admit.   [PS]    Clinical Course User Index [PS] Sharman Cheek, MD     ____________________________________________   FINAL CLINICAL IMPRESSION(S) / ED DIAGNOSES    Final diagnoses:  Pneumonia due to COVID-19 virus  Acute respiratory failure with hypoxia (HCC)  Morbid obesity (HCC)  Type 2 diabetes mellitus without complication, with long-term current use of insulin [facility])     ED Discharge Orders    None      Portions of this note were generated with dragon dictation software. Dictation errors may occur despite best attempts at proofreading.   Sharman Cheek, MD 07/15/20 [phone]

## 2020-07-15 NOTE — Progress Notes (Signed)
Remdesivir - Pharmacy Brief Note   O:  ALT: 14 CXR:  SpO2: 97 % on RA   A/P:  Remdesivir 200 mg IVPB once followed by 100 mg IVPB daily x 4 days.   Lauren Velazquez D 07/15/2020 3:13 AM

## 2020-07-15 NOTE — Progress Notes (Signed)
PHARMACIST - PHYSICIAN COMMUNICATION  CONCERNING:  Enoxaparin (Lovenox) for DVT Prophylaxis    RECOMMENDATION: Patient was prescribed enoxaprin 40mg  q24 hours for VTE prophylaxis.   Filed Weights   07/14/20 2346  Weight: 99.8 kg (220 lb)    Body mass index is 40.24 kg/m.  Estimated Creatinine Clearance: 85.7 mL/min (by C-G formula based on SCr of 0.65 mg/dL).   Based on [facility] policy patient is candidate for enoxaparin 0.5mg /kg TBW SQ every 24 hours based on BMI being >30.   DESCRIPTION: Pharmacy has adjusted enoxaparin dose per [facility] policy.  Patient is now receiving enoxaparin 0.5 mg/kg every 24 hours    [facility], PharmD, [facility] 07/15/2020 7:42 AM

## 2020-07-15 NOTE — ED Notes (Signed)
Receiving RN notified patient en route to floor.

## 2020-07-15 NOTE — ED Notes (Signed)
Per verbal approval by Scotty Court, MD pt given PO fluids. Awaiting further orders. Will continue to monitor.

## 2020-07-15 NOTE — Progress Notes (Signed)
Brief hospitalist update note.  This is a nonbillable note.   Please see same-day H&P from Dr. Para March for full billable details.  Briefly, this is a 59 year old female with history significant for hypertension, hyperlipidemia, morbid obesity who presents for evaluation of shortness of breath.  Found to be hypoxic down to the 60s on arrival.  Requiring high flow nasal cannula 15L to maintain saturations.  Also possibility of concomitant bacterial infection given elevated procalcitonin so started on empiric community-acquired pneumonia coverage.  Patient is hemodynamically stable and afebrile since admission.  She was initially tapered down to 10L nasal cannula however this morning oxygen requirement increased back to 15 L.  Considering medical comorbidities and severity of presentation will change admission status to progressive cardiac.  Should patient further decompensate and require increasing amount of oxygen recommend trial of heated high flow nasal cannula.  Patient's respiratory status is tenuous.  If she develops increased work of breathing or altered mentation may require transfer to intensive care unit/stepdown for noninvasive positive pressure ventilation.  Attempted to call spouse Maisie Fus [phone].  No answer, voicemail box not set up.  Will attempt to reach a later date  Lolita Patella MD

## 2020-07-15 NOTE — ED Notes (Signed)
Pt refusing COVID swab.

## 2020-07-16 LAB — CBC WITH DIFFERENTIAL/PLATELET
Abs Immature Granulocytes: 0.05 10*3/uL (ref 0.00–0.07)
Basophils Absolute: 0 10*3/uL (ref 0.0–0.1)
Basophils Relative: 0 %
Eosinophils Absolute: 0 10*3/uL (ref 0.0–0.5)
Eosinophils Relative: 0 %
HCT: 33.6 % — ABNORMAL LOW (ref 36.0–46.0)
Hemoglobin: 9.2 g/dL — ABNORMAL LOW (ref 12.0–15.0)
Immature Granulocytes: 1 %
Lymphocytes Relative: 17 %
Lymphs Abs: 0.7 10*3/uL (ref 0.7–4.0)
MCH: 19.7 pg — ABNORMAL LOW (ref 26.0–34.0)
MCHC: 27.4 g/dL — ABNORMAL LOW (ref 30.0–36.0)
MCV: 71.9 fL — ABNORMAL LOW (ref 80.0–100.0)
Monocytes Absolute: 0.2 10*3/uL (ref 0.1–1.0)
Monocytes Relative: 6 %
Neutro Abs: 3 10*3/uL (ref 1.7–7.7)
Neutrophils Relative %: 76 %
Platelets: 275 10*3/uL (ref 150–400)
RBC: 4.67 MIL/uL (ref 3.87–5.11)
RDW: 19 % — ABNORMAL HIGH (ref 11.5–15.5)
Smear Review: NORMAL
WBC: 3.9 10*3/uL — ABNORMAL LOW (ref 4.0–10.5)
nRBC: 1.5 % — ABNORMAL HIGH (ref 0.0–0.2)

## 2020-07-16 LAB — COMPREHENSIVE METABOLIC PANEL
ALT: 10 U/L (ref 0–44)
AST: 16 U/L (ref 15–41)
Albumin: 2.5 g/dL — ABNORMAL LOW (ref 3.5–5.0)
Alkaline Phosphatase: 71 U/L (ref 38–126)
Anion gap: 10 (ref 5–15)
BUN: 17 mg/dL (ref 6–20)
CO2: 29 mmol/L (ref 22–32)
Calcium: 8.3 mg/dL — ABNORMAL LOW (ref 8.9–10.3)
Chloride: 99 mmol/L (ref 98–111)
Creatinine, Ser: 0.69 mg/dL (ref 0.44–1.00)
GFR, Estimated: >60 mL/min (ref >60)
Glucose, Bld: 351 mg/dL — ABNORMAL HIGH (ref 70–99)
Potassium: 4.3 mmol/L (ref 3.5–5.1)
Sodium: 138 mmol/L (ref 135–145)
Total Bilirubin: 0.5 mg/dL (ref 0.3–1.2)
Total Protein: 6.2 g/dL — ABNORMAL LOW (ref 6.5–8.1)

## 2020-07-16 LAB — C-REACTIVE PROTEIN: CRP: 6.7 mg/dL — ABNORMAL HIGH (ref <1.0)

## 2020-07-16 LAB — GLUCOSE, CAPILLARY
Glucose-Capillary: 321 mg/dL — ABNORMAL HIGH (ref 70–99)
Glucose-Capillary: 327 mg/dL — ABNORMAL HIGH (ref 70–99)
Glucose-Capillary: 221 mg/dL — ABNORMAL HIGH (ref 70–99)
Glucose-Capillary: 175 mg/dL — ABNORMAL HIGH (ref 70–99)

## 2020-07-16 LAB — FERRITIN: Ferritin: 34 ng/mL (ref 11–307)

## 2020-07-16 LAB — FIBRIN DERIVATIVES D-DIMER (ARMC ONLY): Fibrin derivatives D-dimer (ARMC): 1024.68 ng/mL (FEU) — ABNORMAL HIGH (ref 0.00–499.00)

## 2020-07-16 LAB — HIV ANTIBODY (ROUTINE TESTING W REFLEX): HIV Screen 4th Generation wRfx: NONREACTIVE

## 2020-07-16 MED ORDER — FUROSEMIDE 10 MG/ML IJ SOLN
40.0000 mg | Freq: Once | INTRAMUSCULAR | Status: AC
  Administered 2020-07-16: 40 mg via INTRAVENOUS
  Filled 2020-07-16: qty 4

## 2020-07-16 MED ORDER — INSULIN GLARGINE 100 UNIT/ML ~~LOC~~ SOLN
75.0000 [IU] | Freq: Two times a day (BID) | SUBCUTANEOUS | Status: DC
  Administered 2020-07-16: 75 [IU] via SUBCUTANEOUS
  Filled 2020-07-16 (×3): qty 0.75

## 2020-07-16 MED ORDER — INSULIN GLARGINE 100 UNIT/ML ~~LOC~~ SOLN
85.0000 [IU] | Freq: Two times a day (BID) | SUBCUTANEOUS | Status: DC
  Administered 2020-07-16 – 2020-07-19 (×6): 85 [IU] via SUBCUTANEOUS
  Filled 2020-07-16 (×7): qty 0.85

## 2020-07-16 MED ORDER — OXYCODONE-ACETAMINOPHEN 5-325 MG PO TABS
2.0000 | ORAL_TABLET | Freq: Four times a day (QID) | ORAL | Status: DC | PRN
  Administered 2020-07-16 – 2020-07-22 (×21): 2 via ORAL
  Filled 2020-07-16 (×21): qty 2

## 2020-07-16 MED ORDER — INSULIN ASPART 100 UNIT/ML ~~LOC~~ SOLN
15.0000 [IU] | Freq: Three times a day (TID) | SUBCUTANEOUS | Status: DC
  Administered 2020-07-16 – 2020-07-19 (×8): 15 [IU] via SUBCUTANEOUS
  Filled 2020-07-16 (×8): qty 1

## 2020-07-16 MED ORDER — INSULIN ASPART 100 UNIT/ML ~~LOC~~ SOLN
8.0000 [IU] | Freq: Three times a day (TID) | SUBCUTANEOUS | Status: DC
  Administered 2020-07-16 (×2): 8 [IU] via SUBCUTANEOUS
  Filled 2020-07-16 (×2): qty 1

## 2020-07-16 NOTE — Plan of Care (Signed)
Patient able to maintain O2 sats on 7L HFNC this shift, thought she desats very rapidly with timely recovery for appropriate saturations. She is able to ambulate to restroom but refuses to use BSC, she agrees that drinking water instead of her regular soft drinks will help her control her blood glucose levels.  Problem: Education: Goal: Knowledge of General Education information will improve Description: Including pain rating scale, medication(s)/side effects and non-pharmacologic comfort measures Outcome: Progressing   Problem: Health Behavior/Discharge Planning: Goal: Ability to manage health-related needs will improve Outcome: Progressing   Problem: Clinical Measurements: Goal: Ability to maintain clinical measurements within normal limits will improve Outcome: Progressing Goal: Respiratory complications will improve Outcome: Progressing   Problem: Activity: Goal: Risk for activity intolerance will decrease Outcome: Progressing

## 2020-07-16 NOTE — Progress Notes (Signed)
PROGRESS NOTE    Lauren Velazquez  GYI:[id number] DOB: 1961-06-18 DOA: 07/14/2020 PCP: Hillery Aldo, MD   Brief Narrative:  59 year old female with history significant for hypertension, hyperlipidemia, morbid obesity who presents for evaluation of shortness of breath.  Found to be hypoxic down to the 60s on arrival.  Requiring high flow nasal cannula 15L to maintain saturations.  Also possibility of concomitant bacterial infection given elevated procalcitonin so started on empiric community-acquired pneumonia coverage. 12/2: Patient's respiratory status improving.  Weaned to 7 L high flow nasal cannula.  No fevers over interval.  Normal work of breathing.   Assessment & Plan:   Principal Problem:   Pneumonia due to COVID-19 virus Active Problems:   Diabetes mellitus type 2, uncomplicated (HCC)   Hypertension   Acute respiratory failure due to COVID-19 (HCC)   Obesity, Class III, BMI 40-49.9 (morbid obesity) (HCC)   Chronic prescription opiate use   Bacterial pneumonia   Acute hypoxemic respiratory failure due to COVID-19 [facility])  Multifocal pneumonia due to COVID-19 Suspected superimposed bacterial pneumonia Acute respiratory failure with hypoxia, secondary to above Patient initially was markedly hypoxic to 64% on arrival Has now been weaned from high flow nasal cannula at 15 L Is on 7 L as of 12-21 Plan: Continue remdesivir, day 2/5 Continue steroids, day 2/10 Continue azithromycin, plan for 5-day course Continue Rocephin, plan for 5-day course  Supplemental oxygen as needed, wean as tolerated Antitussives as needed Stress I-S and flutter use Prone as tolerated Daily inflammatory markers Mobilize as tolerated Lasix 40 mg IV x1 today, reassess daily for diuretic need and tolerance  Covid gastroenteritis Patient with nausea vomiting and diarrhea in the past 2 weeks Supportive care  Hyperlipidemia -Not currently on statin  Hypertension  -Continue home  lisinopril  Morbid obesity -BMI 47 complicated factor to prognosis and care  Diabetes mellitusinsulin dependent Basal bolus regimen Carb modified diet  Depression and insomnia  Continue Effexor and trazodone   Chronic pain on chronic opioids Continue home steroids  DVT prophylaxis: Lovenox Code Status: Full Family Communication: None today.  Attempted to call patient's husband yesterday unsuccessfully.  Offered to call today but patient declined Disposition Plan: Status is: Inpatient  Remains inpatient appropriate because:Inpatient level of care appropriate due to severity of illness   Dispo: The patient is from: Home              Anticipated d/c is to: Home              Anticipated d/c date is: 2 days              Patient currently is not medically stable to d/c.  Remains hypoxic currently requiring 7 L high flow nasal cannula.  None stable for discharge at this time      Consultants:   None  Procedures:   None  Antimicrobials:   Remdesivir  Rocephin  Azithromycin   Subjective: Patient seen and examined.  Endorses improvement in shortness of breath.  Hemodynamically stable.  No fevers over interval.  Endorses some worsening of chronic pain.  No other complaints  Objective: Vitals:   07/16/20 0559 07/16/20 0920 07/16/20 1140 07/16/20 1216  BP:  (!) 145/58  118/88  Pulse:  66  78  Resp:  16  16  Temp:  98.4 F (36.9 C)  98.6 F (37 C)  TempSrc:  Oral  Oral  SpO2:  92% 92% 90%  Weight: 104.2 kg     Height:  Intake/Output Summary (Last 24 hours) at 07/16/2020 1249 Last data filed at 07/16/2020 0600 Gross per 24 hour  Intake 350 ml  Output 0 ml  Net 350 ml   Filed Weights   07/14/20 2346 07/15/20 1001 07/16/20 0559  Weight: 99.8 kg 103.8 kg 104.2 kg    Examination:  General exam: No acute distress.  Appears stated age Respiratory system: Bibasilar crackles.  Normal work of breathing.  7 L Cardiovascular system: S1 & S2 heard,  RRR. No JVD, murmurs, rubs, gallops or clicks. No pedal edema. Gastrointestinal system: Obese, nontender, nondistended, normal bowel sounds  Central nervous system: Alert and oriented. No focal neurological deficits. Extremities: Symmetric 5 x 5 power. Skin: No rashes, lesions or ulcers Psychiatry: Judgement and insight appear normal. Mood & affect appropriate.     Data Reviewed: I have personally reviewed following labs and imaging studies  CBC: Recent Labs  Lab 07/15/20 0058 07/16/20 0349  WBC 5.0 3.9*  NEUTROABS 3.4 3.0  HGB 10.1* 9.2*  HCT 38.4 33.6*  MCV 73.6* 71.9*  PLT 299 275   Basic Metabolic Panel: Recent Labs  Lab 07/15/20 0058 07/16/20 0349  NA 142 138  K 4.6 4.3  CL 98 99  CO2 35* 29  GLUCOSE 84 351*  BUN 9 17  CREATININE 0.65 0.69  CALCIUM 8.5* 8.3*   GFR: Estimated Creatinine Clearance: 87.8 mL/min (by C-G formula based on SCr of 0.69 mg/dL). Liver Function Tests: Recent Labs  Lab 07/15/20 0058 07/16/20 0349  AST 25 16  ALT 14 10  ALKPHOS 82 71  BILITOT 1.3* 0.5  PROT 6.9 6.2*  ALBUMIN 2.7* 2.5*   No results for input(s): LIPASE, AMYLASE in the last 168 hours. No results for input(s): AMMONIA in the last 168 hours. Coagulation Profile: No results for input(s): INR, PROTIME in the last 168 hours. Cardiac Enzymes: No results for input(s): CKTOTAL, CKMB, CKMBINDEX, TROPONINI in the last 168 hours. BNP (last 3 results) No results for input(s): PROBNP in the last 8760 hours. HbA1C: No results for input(s): HGBA1C in the last 72 hours. CBG: Recent Labs  Lab 07/15/20 1157 07/15/20 1701 07/15/20 2045 07/16/20 0919 07/16/20 1215  GLUCAP 357* 360* 326* 321* 327*   Lipid Profile: Recent Labs    07/15/20 0058  TRIG 131   Thyroid Function Tests: No results for input(s): TSH, T4TOTAL, FREET4, T3FREE, THYROIDAB in the last 72 hours. Anemia Panel: Recent Labs    07/15/20 0058 07/16/20 0349  FERRITIN 42 34   Sepsis Labs: Recent Labs   Lab 07/15/20 0058  PROCALCITON 0.36  LATICACIDVEN 1.6    No results found for this or any previous visit (from the past 240 hour(s)).       Radiology Studies: DG Chest Port 1 View  Result Date: 07/15/2020 CLINICAL DATA:  COVID pneumonia, hypoxia EXAM: PORTABLE CHEST 1 VIEW COMPARISON:  03/05/2020 FINDINGS: Moderate patchy diffuse pulmonary infiltrate has developed within the lungs bilaterally, likely infectious or inflammatory in nature. Low lung volumes noted, however, pulmonary insufflation is symmetric. No pneumothorax or pleural effusion. Cardiac size within normal limits. No acute bone abnormality. IMPRESSION: Multifocal patchy pulmonary infiltrates diffusely throughout the lungs, likely infectious or inflammatory in the acute setting and compatible with given history of COVID-19 pneumonia. Low lung volumes. Electronically Signed   By: Helyn Numbers MD   On: 07/15/2020 00:42        Scheduled Meds: . albuterol  2 puff Inhalation Q6H  . enoxaparin (LOVENOX) injection  0.5 mg/kg Subcutaneous  Q24H  . gabapentin  300 mg Oral BID  . insulin aspart  0-20 Units Subcutaneous TID WC  . insulin aspart  0-5 Units Subcutaneous QHS  . insulin aspart  8 Units Subcutaneous TID WC  . insulin glargine  75 Units Subcutaneous BID  . linagliptin  5 mg Oral Daily  . lisinopril  40 mg Oral Daily  . methylPREDNISolone (SOLU-MEDROL) injection  0.5 mg/kg Intravenous Q12H   Followed by  . [START ON 07/18/2020] predniSONE  50 mg Oral Daily  . multivitamin with minerals  1 tablet Oral Daily  . venlafaxine XR  75 mg Oral Daily   Continuous Infusions: . azithromycin Stopped (07/15/20 2310)  . cefTRIAXone (ROCEPHIN)  IV Stopped (07/15/20 2131)  . remdesivir 100 mg in NS 100 mL 100 mg (07/16/20 1008)     LOS: 1 day    Time spent: 25 minutes    Tresa Moore, MD [facility] Pager [phone]  If 7PM-7AM, please contact night-coverage 07/16/2020, 12:49 PM

## 2020-07-16 NOTE — Progress Notes (Signed)
Initial Nutrition Assessment  DOCUMENTATION CODES:   Morbid obesity  INTERVENTION:   Pt declines all nutritional interventions at this time   NUTRITION DIAGNOSIS:   Increased nutrient needs related to catabolic illness (COVID 19) as evidenced by estimated needs.  GOAL:   Patient will meet greater than or equal to 90% of their needs  MONITOR:   PO intake, Supplement acceptance, Labs, Weight trends, Skin, I & O's  REASON FOR ASSESSMENT:   Malnutrition Screening Tool    ASSESSMENT:   59 y.o. female with medical history significant for DM, HTN, depression, morbid obesity, chronic opiate use, diagnosed with Covid on 11/25 was brought in by EMS with increased shortness of breath with O2 sat 64% on room air, placed on nonrebreather.  Spoke with pt via phone. Pt reports decreased appetite and oral intake for ~ 2 weeks pta r/t nausea and vomiting. Pt reports that her appetite has improved in hospital and that she is eating almost 100% of her meals. RD discussed with pt the importance of adequate nutrition needed to preserve lean muscle and discussed how this relates to the COVID 19 virus. Pt declines all nutritional interventions at this time. Pt was offered numerous different supplements, snacks and double protein with meals but declines everything. Per chart, pt appears weight stable pta.   Medications reviewed and include: lovenox, insulin, solu-medrol, MVI, azithromycin, ceftriaxone  Labs reviewed: wbc- 3.9(L), Hgb 9.2(L), Hct 33.6(L), MCV 71.9(L), MCH 19.7(L), MCHC 27.4(L) cbgs- 321, 327 x 24 hrs AIC 9.1(H)- 7/23  NUTRITION - FOCUSED PHYSICAL EXAM: Unable to perform at this time   Diet Order:   Diet Order            Diet heart healthy/carb modified Room service appropriate? Yes; Fluid consistency: Thin  Diet effective now                EDUCATION NEEDS:   Education needs have been addressed  Skin:  Skin Assessment: Reviewed RN Assessment  Last BM:  pta  Height:    Ht Readings from Last 1 Encounters:  07/15/20 5\' 2"  (1.575 m)    Weight:   Wt Readings from Last 1 Encounters:  07/16/20 104.2 kg    Ideal Body Weight:  50 kg  BMI:  Body mass index is 42.01 kg/m.  Estimated Nutritional Needs:   Kcal:  2100-2400kcal/day  Protein:  105-120g/day  Fluid:  1.5-1.8L/day  14/02/21 MS, RD, LDN Please refer to [facility] for RD and/or RD on-call/weekend/after hours pager

## 2020-07-16 NOTE — Progress Notes (Signed)
Inpatient Diabetes Program Recommendations  AACE/ADA: New Consensus Statement on Inpatient Glycemic Control (2015)  Target Ranges:  Prepandial:   less than 140 mg/dL      Peak postprandial:   less than 180 mg/dL (1-2 hours)      Critically ill patients:  140 - 180 mg/dL   Lab Results  Component Value Date   GLUCAP 321 (H) 07/16/2020   HGBA1C 9.1 (H) 03/06/2020    Review of Glycemic Control Results for JUNETTA, HEARN (MRN [id number]) as of 07/16/2020 10:50  Ref. Range 07/15/2020 11:57 07/15/2020 17:01 07/15/2020 20:45 07/16/2020 09:19  Glucose-Capillary Latest Ref Range: 70 - 99 mg/dL 833 (H) 383 (H) 291 (H) 321 (H)   Diabetes history: Type 2 DM Outpatient Diabetes medications: Lantus 70 units BID, Novolog 0-20 units TID Current orders for Inpatient glycemic control: Lantus 75 units BID, Novolog 8 units TID, Novolog 0-20 units TID, Novolog 0-5 units QHS, Tradjenta 5 mg QD Solumedrol BID Prednisone to resume on 12/4 Inpatient Diabetes Program Recommendations:    In the setting of steroids and Covid, consider further increasing meal coverage to Novolog 15 units TID (assuming patient is consuming >50% of meals) and Lantus to 80 units BID.   Thanks, Lujean Rave, MSN, RNC-OB Diabetes Coordinator [phone] (8a-5p)

## 2020-07-16 NOTE — Progress Notes (Signed)
Pt continues to refuse to use incentive spirometer and flutter valve.

## 2020-07-17 LAB — CBC WITH DIFFERENTIAL/PLATELET
Abs Immature Granulocytes: 0.05 10*3/uL (ref 0.00–0.07)
Basophils Absolute: 0 10*3/uL (ref 0.0–0.1)
Basophils Relative: 0 %
Eosinophils Absolute: 0 10*3/uL (ref 0.0–0.5)
Eosinophils Relative: 0 %
HCT: 32.9 % — ABNORMAL LOW (ref 36.0–46.0)
Hemoglobin: 9 g/dL — ABNORMAL LOW (ref 12.0–15.0)
Immature Granulocytes: 1 %
Lymphocytes Relative: 11 %
Lymphs Abs: 0.6 10*3/uL — ABNORMAL LOW (ref 0.7–4.0)
MCH: 19.9 pg — ABNORMAL LOW (ref 26.0–34.0)
MCHC: 27.4 g/dL — ABNORMAL LOW (ref 30.0–36.0)
MCV: 72.6 fL — ABNORMAL LOW (ref 80.0–100.0)
Monocytes Absolute: 0.2 10*3/uL (ref 0.1–1.0)
Monocytes Relative: 5 %
Neutro Abs: 4.2 10*3/uL (ref 1.7–7.7)
Neutrophils Relative %: 83 %
Platelets: 245 10*3/uL (ref 150–400)
RBC: 4.53 MIL/uL (ref 3.87–5.11)
RDW: 19.8 % — ABNORMAL HIGH (ref 11.5–15.5)
WBC: 5.1 10*3/uL (ref 4.0–10.5)
nRBC: 0.6 % — ABNORMAL HIGH (ref 0.0–0.2)

## 2020-07-17 LAB — COMPREHENSIVE METABOLIC PANEL
ALT: 10 U/L (ref 0–44)
AST: 15 U/L (ref 15–41)
Albumin: 2.4 g/dL — ABNORMAL LOW (ref 3.5–5.0)
Alkaline Phosphatase: 60 U/L (ref 38–126)
Anion gap: 11 (ref 5–15)
BUN: 18 mg/dL (ref 6–20)
CO2: 30 mmol/L (ref 22–32)
Calcium: 8.1 mg/dL — ABNORMAL LOW (ref 8.9–10.3)
Chloride: 99 mmol/L (ref 98–111)
Creatinine, Ser: 0.63 mg/dL (ref 0.44–1.00)
GFR, Estimated: >60 mL/min (ref >60)
Glucose, Bld: 226 mg/dL — ABNORMAL HIGH (ref 70–99)
Potassium: 4.3 mmol/L (ref 3.5–5.1)
Sodium: 140 mmol/L (ref 135–145)
Total Bilirubin: 0.4 mg/dL (ref 0.3–1.2)
Total Protein: 6 g/dL — ABNORMAL LOW (ref 6.5–8.1)

## 2020-07-17 LAB — C-REACTIVE PROTEIN: CRP: 2.7 mg/dL — ABNORMAL HIGH (ref <1.0)

## 2020-07-17 LAB — GLUCOSE, CAPILLARY
Glucose-Capillary: 201 mg/dL — ABNORMAL HIGH (ref 70–99)
Glucose-Capillary: 184 mg/dL — ABNORMAL HIGH (ref 70–99)
Glucose-Capillary: 218 mg/dL — ABNORMAL HIGH (ref 70–99)
Glucose-Capillary: 216 mg/dL — ABNORMAL HIGH (ref 70–99)

## 2020-07-17 LAB — FERRITIN: Ferritin: 24 ng/mL (ref 11–307)

## 2020-07-17 LAB — FIBRIN DERIVATIVES D-DIMER (ARMC ONLY): Fibrin derivatives D-dimer (ARMC): 908.75 ng/mL (FEU) — ABNORMAL HIGH (ref 0.00–499.00)

## 2020-07-17 MED ORDER — FUROSEMIDE 10 MG/ML IJ SOLN
40.0000 mg | Freq: Once | INTRAMUSCULAR | Status: AC
  Administered 2020-07-17: 40 mg via INTRAVENOUS
  Filled 2020-07-17: qty 4

## 2020-07-17 NOTE — Progress Notes (Signed)
PROGRESS NOTE    Lauren Velazquez  BTD:[id number] DOB: Mar 16, 1961 DOA: 07/14/2020 PCP: Hillery Aldo, MD   Brief Narrative:  59 year old female with history significant for hypertension, hyperlipidemia, morbid obesity who presents for evaluation of shortness of breath.  Found to be hypoxic down to the 60s on arrival.  Requiring high flow nasal cannula 15L to maintain saturations.  Also possibility of concomitant bacterial infection given elevated procalcitonin so started on empiric community-acquired pneumonia coverage. 12/2: Patient's respiratory status improving.  Weaned to 7 L high flow nasal cannula.  No fevers over interval.  Normal work of breathing.  Has not been adherent to incentive spirometry and flutter valve use   Assessment & Plan:   Principal Problem:   Pneumonia due to COVID-19 virus Active Problems:   Diabetes mellitus type 2, uncomplicated (HCC)   Hypertension   Acute respiratory failure due to COVID-19 (HCC)   Obesity, Class III, BMI 40-49.9 (morbid obesity) (HCC)   Chronic prescription opiate use   Bacterial pneumonia   Acute hypoxemic respiratory failure due to COVID-19 [facility])  Multifocal pneumonia due to COVID-19 Suspected superimposed bacterial pneumonia Acute respiratory failure with hypoxia, secondary to above Patient initially was markedly hypoxic to 64% on arrival Has now been weaned from high flow nasal cannula at 15 L Is on 7 L as of 12-21 Plan: Continue remdesivir, day 3/5 Continue steroids, day 3/10 Continue azithromycin, plan for 5-day course Continue Rocephin, plan for 5-day course  Supplemental oxygen as needed, wean as tolerated Antitussives as needed Stress I-S and flutter use Prone as tolerated Daily inflammatory markers Mobilize as tolerated Lasix 40 mg IV x1 today, reassess daily for diuretic need and tolerance  Covid gastroenteritis Patient with nausea vomiting and diarrhea in the past 2 weeks Supportive care Appears to be  improving  Hyperlipidemia -Not currently on statin  Hypertension  -Continue home lisinopril  Morbid obesity -BMI 47 complicated factor to prognosis and care  Diabetes mellitusinsulin dependent Basal bolus regimen Carb modified diet  Depression and insomnia  Continue Effexor and trazodone   Chronic pain on chronic opioids Continue home narcotic regimen  DVT prophylaxis: Lovenox Code Status: Full Family Communication: None today.  Attempted to call patient's husband Maisie Fus [phone] on 07/17/2020.  No answer, could not leave voicemail Disposition Plan: Status is: Inpatient  Remains inpatient appropriate because:Inpatient level of care appropriate due to severity of illness   Dispo: The patient is from: Home              Anticipated d/c is to: Home              Anticipated d/c date is: 2 days              Patient currently is not medically stable to d/c.   Remains hypoxic requiring 7 L high flow nasal cannula.  Has not been adherent to incentive spirometry and flutter valve use.  Anticipate 48 additional hours of inpatient treatment and monitoring prior to disposition planning.  Anticipate discharge back home.  Anticipate need for home oxygen    Consultants:   None  Procedures:   None  Antimicrobials:   Remdesivir  Rocephin  Azithromycin   Subjective: Patient seen and examined.  Continues to endorse shortness of breath.  Hemodynamically stable.  No fevers over interval.  No change in oxygen demand.  Remains on 7 L  Objective: Vitals:   07/16/20 2100 07/16/20 2200 07/17/20 0337 07/17/20 0815  BP:   140/70 (!) 151/84  Pulse:  63 60  Resp: (!) 21 (!) 21 20 17   Temp:   98.2 F (36.8 C) 98.2 F (36.8 C)  TempSrc:   Oral Oral  SpO2:   100% 95%  Weight:   107.4 kg   Height:        Intake/Output Summary (Last 24 hours) at 07/17/2020 1117 Last data filed at 07/16/2020 1650 Gross per 24 hour  Intake 450 ml  Output --  Net 450 ml   Filed  Weights   07/15/20 1001 07/16/20 0559 07/17/20 0337  Weight: 103.8 kg 104.2 kg 107.4 kg    Examination:  General exam: No acute distress.  Appears stated age Respiratory system: Poor respiratory effort.  Bilateral crackles.  Normal work of breathing.  7 L Cardiovascular system: S1 & S2 heard, RRR. No JVD, murmurs, rubs, gallops or clicks. No pedal edema. Gastrointestinal system: Obese, nontender, nondistended, normal bowel sounds  Central nervous system: Alert and oriented. No focal neurological deficits. Extremities: Symmetric 5 x 5 power. Skin: No rashes, lesions or ulcers Psychiatry: Judgement and insight appear normal. Mood & affect appropriate.     Data Reviewed: I have personally reviewed following labs and imaging studies  CBC: Recent Labs  Lab 07/15/20 0058 07/16/20 0349 07/17/20 0410  WBC 5.0 3.9* 5.1  NEUTROABS 3.4 3.0 4.2  HGB 10.1* 9.2* 9.0*  HCT 38.4 33.6* 32.9*  MCV 73.6* 71.9* 72.6*  PLT 299 275 245   Basic Metabolic Panel: Recent Labs  Lab 07/15/20 0058 07/16/20 0349 07/17/20 0410  NA 142 138 140  K 4.6 4.3 4.3  CL 98 99 99  CO2 35* 29 30  GLUCOSE 84 351* 226*  BUN 9 17 18   CREATININE 0.65 0.69 0.63  CALCIUM 8.5* 8.3* 8.1*   GFR: Estimated Creatinine Clearance: 89.4 mL/min (by C-G formula based on SCr of 0.63 mg/dL). Liver Function Tests: Recent Labs  Lab 07/15/20 0058 07/16/20 0349 07/17/20 0410  AST 25 16 15   ALT 14 10 10   ALKPHOS 82 71 60  BILITOT 1.3* 0.5 0.4  PROT 6.9 6.2* 6.0*  ALBUMIN 2.7* 2.5* 2.4*   No results for input(s): LIPASE, AMYLASE in the last 168 hours. No results for input(s): AMMONIA in the last 168 hours. Coagulation Profile: No results for input(s): INR, PROTIME in the last 168 hours. Cardiac Enzymes: No results for input(s): CKTOTAL, CKMB, CKMBINDEX, TROPONINI in the last 168 hours. BNP (last 3 results) No results for input(s): PROBNP in the last 8760 hours. HbA1C: No results for input(s): HGBA1C in the  last 72 hours. CBG: Recent Labs  Lab 07/16/20 0919 07/16/20 1215 07/16/20 1717 07/16/20 2002 07/17/20 0809  GLUCAP 321* 327* 221* 175* 201*   Lipid Profile: Recent Labs    07/15/20 0058  TRIG 131   Thyroid Function Tests: No results for input(s): TSH, T4TOTAL, FREET4, T3FREE, THYROIDAB in the last 72 hours. Anemia Panel: Recent Labs    07/16/20 0349 07/17/20 0410  FERRITIN 34 24   Sepsis Labs: Recent Labs  Lab 07/15/20 0058  PROCALCITON 0.36  LATICACIDVEN 1.6    No results found for this or any previous visit (from the past 240 hour(s)).       Radiology Studies: No results found.      Scheduled Meds: . albuterol  2 puff Inhalation Q6H  . enoxaparin (LOVENOX) injection  0.5 mg/kg Subcutaneous Q24H  . gabapentin  300 mg Oral BID  . insulin aspart  0-20 Units Subcutaneous TID WC  . insulin aspart  0-5 Units  Subcutaneous QHS  . insulin aspart  15 Units Subcutaneous TID WC  . insulin glargine  85 Units Subcutaneous BID  . linagliptin  5 mg Oral Daily  . lisinopril  40 mg Oral Daily  . methylPREDNISolone (SOLU-MEDROL) injection  0.5 mg/kg Intravenous Q12H   Followed by  . [START ON 07/18/2020] predniSONE  50 mg Oral Daily  . multivitamin with minerals  1 tablet Oral Daily  . venlafaxine XR  75 mg Oral Daily   Continuous Infusions: . azithromycin Stopped (07/16/20 2304)  . cefTRIAXone (ROCEPHIN)  IV Stopped (07/16/20 2153)  . remdesivir 100 mg in NS 100 mL 100 mg (07/17/20 0852)     LOS: 2 days    Time spent: 15 minutes    Tresa Moore, MD [facility] Pager [phone]  If 7PM-7AM, please contact night-coverage 07/17/2020, 11:17 AM

## 2020-07-17 NOTE — Progress Notes (Signed)
OT Cancellation Note  Patient Details Name: Lauren Velazquez MRN: [id number] DOB: 1961-01-14   Cancelled Treatment:    Reason Eval/Treat Not Completed: Medical issues which prohibited therapy. Consult received, chart reviewed. Pt noted on telemetry with HR in mid 40's at rest. Will hold OT evaluation at this time and re-attempt at later time when more medically appropriate.   Richrd Prime, MPH, MS, OTR/L ascom [phone] 07/17/20, 4:01 PM

## 2020-07-17 NOTE — Progress Notes (Signed)
Report given to [facility] on 1C. Patient to be transferred to room 109.

## 2020-07-17 NOTE — Evaluation (Signed)
Physical Therapy Evaluation Patient Details Name: Lauren Velazquez MRN: [id number] DOB: 06/21/1961 Today's Date: 07/17/2020   History of Present Illness  Pt is a 59 year old female with history significant for hypertension, hyperlipidemia, morbid obesity, per pt home O2 use (3L) who presents for evaluation of shortness of breath.  Found to be hypoxic down to the 60s on arrival.  Requiring high flow nasal cannula 15L to maintain saturations. Admitted for multifocal PNA 2/2 CV-19+, suspected superimposed bacterial PNA, and acute respiratory failure with hypoxia 2/2 covid PNA. Weaned to 7L HNC as of 07/15/20.    Clinical Impression  Pt alert, denied pain, reported feeling a little bit better than when she came in, but unhappy about how poorly items are functioning in her room (bed, call bell, lights, etc) RN notified. Per pt at baseline she is independent, works part time, lives with her husband who is available 24/7.  The patient demonstrated bed mobility modI (HOB elevated). Able to perform sit <> stand from EOB and from [facility] in room with supervision. Pt informed the PT that she has been up to the bathroom in her room without assist (and doffs her oxygen) encouraged to use [facility] and RN notified of pt mobility. When attempting to ambulate with PT on 8L via HFNC, pt reported lightheadedness after ~80ft without AD and supervision. Returned to EOB due to pt symptoms, spO2 >90% throughout attempt. Overall the patient demonstrated deficits in activity tolerance and endurance and would benefit from further skilled PT intervention. PT discussed discharge options, pt declining any PT follow up at this time.     Follow Up Recommendations Home health PT    Equipment Recommendations  None recommended by PT    Recommendations for Other Services       Precautions / Restrictions Precautions Precautions: Fall Restrictions Weight Bearing Restrictions: No      Mobility  Bed Mobility Overal bed mobility:  Modified Independent                  Transfers Overall transfer level: Needs assistance Equipment used: None Transfers: Sit to/from Stand Sit to Stand: Supervision            Ambulation/Gait   Gait Distance (Feet): 5 Feet Assistive device: None   Gait velocity: decreased   General Gait Details: Pt placed on 8L via tank and HFNC, pt reported light headedness unable to ambulat >17ft without a seated rest break. Pt endorses that she has been up and walking to the bathroom this hospital stay  Stairs            Wheelchair Mobility    Modified Rankin (Stroke Patients Only)       Balance Overall balance assessment: Needs assistance Sitting-balance support: Feet supported Sitting balance-Leahy Scale: Normal       Standing balance-Leahy Scale: Fair Standing balance comment: pt more comfortable with at leaset unilateral support                             Pertinent Vitals/Pain Pain Assessment: No/denies pain    Home Living Family/patient expects to be discharged to:: Private residence Living Arrangements: Spouse/significant other Available Help at Discharge: Family;Available 24 hours/day Type of Home: House Home Access: Stairs to enter Entrance Stairs-Rails: Right Entrance Stairs-Number of Steps: 3 Home Layout: One level Home Equipment: Walker - standard Additional Comments: no falls, uses oxygen at home (3L)    Prior Function Level of Independence: Independent  Comments: works part time     Higher education careers adviser        Extremity/Trunk Assessment   Upper Extremity Assessment Upper Extremity Assessment: Overall WFL for tasks assessed    Lower Extremity Assessment Lower Extremity Assessment: Generalized weakness    Cervical / Trunk Assessment Cervical / Trunk Assessment: Normal  Communication   Communication: No difficulties  Cognition Arousal/Alertness: Awake/alert Behavior During Therapy: WFL for tasks  assessed/performed Overall Cognitive Status: Within Functional Limits for tasks assessed                                        General Comments      Exercises     Assessment/Plan    PT Assessment Patient needs continued PT services  PT Problem List Decreased mobility;Decreased activity tolerance;Decreased balance;Cardiopulmonary status limiting activity       PT Treatment Interventions DME instruction;Therapeutic exercise;Gait training;Balance training;Stair training;Neuromuscular re-education;Functional mobility training;Therapeutic activities;Patient/family education    PT Goals (Current goals can be found in the Care Plan section)  Acute Rehab PT Goals Patient Stated Goal: to feel better PT Goal Formulation: With patient Time For Goal Achievement: 07/31/20 Potential to Achieve Goals: Good    Frequency Min 2X/week   Barriers to discharge        Co-evaluation               AM-PAC PT "6 Clicks" Mobility  Outcome Measure Help needed turning from your back to your side while in a flat bed without using bedrails?: None Help needed moving from lying on your back to sitting on the side of a flat bed without using bedrails?: None Help needed moving to and from a bed to a chair (including a wheelchair)?: None Help needed standing up from a chair using your arms (e.g., wheelchair or bedside chair)?: None Help needed to walk in hospital room?: A Little Help needed climbing 3-5 steps with a railing? : A Little 6 Click Score: 22    End of Session Equipment Utilized During Treatment: Gait belt Activity Tolerance: Patient tolerated treatment well Patient left: with call bell/phone within reach;in bed (Pt sitting on EOB) Nurse Communication: Mobility status PT Visit Diagnosis: Other abnormalities of gait and mobility (R26.89);Difficulty in walking, not elsewhere classified (R26.2)    Time: 6283-1517 PT Time Calculation (min) (ACUTE ONLY): 27  min   Charges:   PT Evaluation $PT Eval Low Complexity: 1 Low PT Treatments $Therapeutic Activity: 8-22 mins        Olga Coaster PT, DPT 10:34 AM,07/17/20

## 2020-07-18 LAB — FERRITIN: Ferritin: 21 ng/mL (ref 11–307)

## 2020-07-18 LAB — COMPREHENSIVE METABOLIC PANEL
ALT: 13 U/L (ref 0–44)
AST: 16 U/L (ref 15–41)
Albumin: 2.5 g/dL — ABNORMAL LOW (ref 3.5–5.0)
Alkaline Phosphatase: 57 U/L (ref 38–126)
Anion gap: 7 (ref 5–15)
BUN: 18 mg/dL (ref 6–20)
CO2: 33 mmol/L — ABNORMAL HIGH (ref 22–32)
Calcium: 8.1 mg/dL — ABNORMAL LOW (ref 8.9–10.3)
Chloride: 97 mmol/L — ABNORMAL LOW (ref 98–111)
Creatinine, Ser: 0.53 mg/dL (ref 0.44–1.00)
GFR, Estimated: >60 mL/min (ref >60)
Glucose, Bld: 279 mg/dL — ABNORMAL HIGH (ref 70–99)
Potassium: 4.4 mmol/L (ref 3.5–5.1)
Sodium: 137 mmol/L (ref 135–145)
Total Bilirubin: 0.5 mg/dL (ref 0.3–1.2)
Total Protein: 5.8 g/dL — ABNORMAL LOW (ref 6.5–8.1)

## 2020-07-18 LAB — GLUCOSE, CAPILLARY
Glucose-Capillary: 183 mg/dL — ABNORMAL HIGH (ref 70–99)
Glucose-Capillary: 297 mg/dL — ABNORMAL HIGH (ref 70–99)
Glucose-Capillary: 307 mg/dL — ABNORMAL HIGH (ref 70–99)
Glucose-Capillary: 323 mg/dL — ABNORMAL HIGH (ref 70–99)
Glucose-Capillary: 281 mg/dL — ABNORMAL HIGH (ref 70–99)

## 2020-07-18 LAB — CBC WITH DIFFERENTIAL/PLATELET
Abs Immature Granulocytes: 0.04 10*3/uL (ref 0.00–0.07)
Basophils Absolute: 0 10*3/uL (ref 0.0–0.1)
Basophils Relative: 0 %
Eosinophils Absolute: 0 10*3/uL (ref 0.0–0.5)
Eosinophils Relative: 0 %
HCT: 32.7 % — ABNORMAL LOW (ref 36.0–46.0)
Hemoglobin: 8.7 g/dL — ABNORMAL LOW (ref 12.0–15.0)
Immature Granulocytes: 1 %
Lymphocytes Relative: 10 %
Lymphs Abs: 0.5 10*3/uL — ABNORMAL LOW (ref 0.7–4.0)
MCH: 19.5 pg — ABNORMAL LOW (ref 26.0–34.0)
MCHC: 26.6 g/dL — ABNORMAL LOW (ref 30.0–36.0)
MCV: 73.2 fL — ABNORMAL LOW (ref 80.0–100.0)
Monocytes Absolute: 0.2 10*3/uL (ref 0.1–1.0)
Monocytes Relative: 5 %
Neutro Abs: 3.9 10*3/uL (ref 1.7–7.7)
Neutrophils Relative %: 84 %
Platelets: 234 10*3/uL (ref 150–400)
RBC: 4.47 MIL/uL (ref 3.87–5.11)
RDW: 19.3 % — ABNORMAL HIGH (ref 11.5–15.5)
WBC: 4.7 10*3/uL (ref 4.0–10.5)
nRBC: 0.4 % — ABNORMAL HIGH (ref 0.0–0.2)

## 2020-07-18 LAB — FIBRIN DERIVATIVES D-DIMER (ARMC ONLY): Fibrin derivatives D-dimer (ARMC): 670.79 ng/mL (FEU) — ABNORMAL HIGH (ref 0.00–499.00)

## 2020-07-18 LAB — C-REACTIVE PROTEIN: CRP: 1.1 mg/dL — ABNORMAL HIGH (ref <1.0)

## 2020-07-18 MED ORDER — MAGIC MOUTHWASH: 15.0000 mL | Freq: Four times a day (QID) | ORAL | Status: DC | PRN

## 2020-07-18 MED ORDER — CEFTRIAXONE SODIUM 1 G IJ SOLR
1.0000 g | INTRAMUSCULAR | Status: AC
  Administered 2020-07-19: 01:00:00 1 g via INTRAMUSCULAR
  Filled 2020-07-18: qty 10

## 2020-07-18 MED ORDER — FUROSEMIDE 10 MG/ML IJ SOLN
40.0000 mg | Freq: Once | INTRAMUSCULAR | Status: AC
  Administered 2020-07-18: 40 mg via INTRAVENOUS
  Filled 2020-07-18: qty 4

## 2020-07-18 MED ORDER — MAGIC MOUTHWASH W/LIDOCAINE: 15.0000 mL | Freq: Four times a day (QID) | ORAL | Status: DC | PRN

## 2020-07-18 MED ORDER — CEFTRIAXONE SODIUM 1 G IJ SOLR
1.0000 g | Freq: Once | INTRAMUSCULAR | Status: AC
  Administered 2020-07-19: 1 g via INTRAMUSCULAR
  Filled 2020-07-18: qty 10

## 2020-07-18 MED ORDER — LIDOCAINE VISCOUS HCL 2 % MT SOLN
15.0000 mL | Freq: Four times a day (QID) | OROMUCOSAL | Status: DC | PRN
  Filled 2020-07-18: qty 15

## 2020-07-18 NOTE — Progress Notes (Signed)
D/c tele per Dr Sreenath 

## 2020-07-18 NOTE — Progress Notes (Signed)
piv consult.  Attempt x2.  Pt. Refuses any further attempts by this IV RN.

## 2020-07-18 NOTE — Plan of Care (Signed)

## 2020-07-18 NOTE — Progress Notes (Signed)
PROGRESS NOTE    Lauren Velazquez  EYC:[id number] DOB: 1961-05-23 DOA: 07/14/2020 PCP: Hillery Aldo, MD   Brief Narrative:  59 year old female with history significant for hypertension, hyperlipidemia, morbid obesity who presents for evaluation of shortness of breath.  Found to be hypoxic down to the 60s on arrival.  Requiring high flow nasal cannula 15L to maintain saturations.  Also possibility of concomitant bacterial infection given elevated procalcitonin so started on empiric community-acquired pneumonia coverage. 12/2: Patient's respiratory status improving.  Weaned to 7 L high flow nasal cannula.  No fevers over interval.  Normal work of breathing.  Has not been adherent to incentive spirometry and flutter valve use 12/3: Weaned to 5 L nasal cannula.  Shortness of breath and respiratory status improving   Assessment & Plan:   Principal Problem:   Pneumonia due to COVID-19 virus Active Problems:   Diabetes mellitus type 2, uncomplicated (HCC)   Hypertension   Acute respiratory failure due to COVID-19 (HCC)   Obesity, Class III, BMI 40-49.9 (morbid obesity) (HCC)   Chronic prescription opiate use   Bacterial pneumonia   Acute hypoxemic respiratory failure due to COVID-19 [facility])  Multifocal pneumonia due to COVID-19 Suspected superimposed bacterial pneumonia Acute respiratory failure with hypoxia, secondary to above Patient initially was markedly hypoxic to 64% on arrival Has now been weaned from high flow nasal cannula at 15 L 12/21, weaned to 4 L Plan: Continue remdesivir, day 4/5 Continue steroids, day 4/10 Stop azithromycin after 3-day course Continue Rocephin, plan for 5-day course  Supplemental oxygen as needed, wean as tolerated Antitussives as needed Stress I-S and flutter use Prone as tolerated Daily inflammatory markers Mobilize as tolerated Lasix 40 mg IV x1 today.  Attempt to achieve net 0 to slightly net negative fluid balance  Covid  gastroenteritis Patient with nausea vomiting and diarrhea in the past 2 weeks Supportive care Appears to be improving Tolerating p.o.  Hyperlipidemia -Not currently on statin  Hypertension  -Continue home lisinopril  Morbid obesity -BMI 47 complicated factor to prognosis and care  Diabetes mellitusinsulin dependent Basal bolus regimen Carb modified diet  Depression and insomnia  Continue Effexor and trazodone   Chronic pain on chronic opioids Continue home narcotic regimen  DVT prophylaxis: Lovenox Code Status: Full Family Communication: Husband Ali Lowe [phone] on 07/18/2020 Disposition Plan: Status is: Inpatient  Remains inpatient appropriate because:Inpatient level of care appropriate due to severity of illness   Dispo: The patient is from: Home              Anticipated d/c is to: Home              Anticipated d/c date is: 1 day              Patient currently is not medically stable to d/c.   Remains hypoxic but improving over interval.  Will attempt to discharge on 07/19/2020 if weaned to 2 L of oxygen last   Consultants:   None  Procedures:   None  Antimicrobials:   Remdesivir  Rocephin  Azithromycin   Subjective: Patient seen and examined.  Shortness of breath improving.  Remains hemodynamically stable.  No fevers over interval.  Weaned to 4 L nasal cannula  Objective: Vitals:   07/17/20 1907 07/18/20 0440 07/18/20 0749 07/18/20 0800  BP: (!) 169/60 (!) 145/84 (!) 156/90   Pulse: 63 (!) 55 (!) 57 61  Resp: 18 18 18 16   Temp: 98 F (36.7 C) 98 F (36.7 C) 98.7 F (  37.1 C)   TempSrc: Oral Oral    SpO2: 98% 99% 99% 99%  Weight: 104.9 kg     Height: 5\' 2"  (1.575 m)       Intake/Output Summary (Last 24 hours) at 07/18/2020 1112 Last data filed at 07/18/2020 1024 Gross per 24 hour  Intake 622.08 ml  Output --  Net 622.08 ml   Filed Weights   07/16/20 0559 07/17/20 0337 07/17/20 1907  Weight: 104.2 kg 107.4 kg 104.9  kg    Examination:  General exam: No acute distress.  Appears stated age Respiratory system: Poor inspiratory effort.  Bibasilar crackles.  Normal work of breathing.  4 L  cardiovascular system: S1 & S2 heard, RRR. No JVD, murmurs, rubs, gallops or clicks. No pedal edema. Gastrointestinal system: Obese, nontender, nondistended, normal bowel sounds  Central nervous system: Alert and oriented. No focal neurological deficits. Extremities: Symmetric 5 x 5 power. Skin: No rashes, lesions or ulcers Psychiatry: Judgement and insight appear normal. Mood & affect appropriate.     Data Reviewed: I have personally reviewed following labs and imaging studies  CBC: Recent Labs  Lab 07/15/20 0058 07/16/20 0349 07/17/20 0410 07/18/20 0440  WBC 5.0 3.9* 5.1 4.7  NEUTROABS 3.4 3.0 4.2 3.9  HGB 10.1* 9.2* 9.0* 8.7*  HCT 38.4 33.6* 32.9* 32.7*  MCV 73.6* 71.9* 72.6* 73.2*  PLT 299 275 245 234   Basic Metabolic Panel: Recent Labs  Lab 07/15/20 0058 07/16/20 0349 07/17/20 0410 07/18/20 0440  NA 142 138 140 137  K 4.6 4.3 4.3 4.4  CL 98 99 99 97*  CO2 35* 29 30 33*  GLUCOSE 84 351* 226* 279*  BUN 9 17 18 18   CREATININE 0.65 0.69 0.63 0.53  CALCIUM 8.5* 8.3* 8.1* 8.1*   GFR: Estimated Creatinine Clearance: 88.2 mL/min (by C-G formula based on SCr of 0.53 mg/dL). Liver Function Tests: Recent Labs  Lab 07/15/20 0058 07/16/20 0349 07/17/20 0410 07/18/20 0440  AST 25 16 15 16   ALT 14 10 10 13   ALKPHOS 82 71 60 57  BILITOT 1.3* 0.5 0.4 0.5  PROT 6.9 6.2* 6.0* 5.8*  ALBUMIN 2.7* 2.5* 2.4* 2.5*   No results for input(s): LIPASE, AMYLASE in the last 168 hours. No results for input(s): AMMONIA in the last 168 hours. Coagulation Profile: No results for input(s): INR, PROTIME in the last 168 hours. Cardiac Enzymes: No results for input(s): CKTOTAL, CKMB, CKMBINDEX, TROPONINI in the last 168 hours. BNP (last 3 results) No results for input(s): PROBNP in the last 8760  hours. HbA1C: No results for input(s): HGBA1C in the last 72 hours. CBG: Recent Labs  Lab 07/17/20 1217 07/17/20 1644 07/17/20 1905 07/17/20 2119 07/18/20 0747  GLUCAP 184* 218* 216* 183* 297*   Lipid Profile: No results for input(s): CHOL, HDL, LDLCALC, TRIG, CHOLHDL, LDLDIRECT in the last 72 hours. Thyroid Function Tests: No results for input(s): TSH, T4TOTAL, FREET4, T3FREE, THYROIDAB in the last 72 hours. Anemia Panel: Recent Labs    07/17/20 0410 07/18/20 0440  FERRITIN 24 21   Sepsis Labs: Recent Labs  Lab 07/15/20 0058  PROCALCITON 0.36  LATICACIDVEN 1.6    No results found for this or any previous visit (from the past 240 hour(s)).       Radiology Studies: No results found.      Scheduled Meds: . albuterol  2 puff Inhalation Q6H  . enoxaparin (LOVENOX) injection  0.5 mg/kg Subcutaneous Q24H  . gabapentin  300 mg Oral BID  . insulin  aspart  0-20 Units Subcutaneous TID WC  . insulin aspart  0-5 Units Subcutaneous QHS  . insulin aspart  15 Units Subcutaneous TID WC  . insulin glargine  85 Units Subcutaneous BID  . linagliptin  5 mg Oral Daily  . lisinopril  40 mg Oral Daily  . multivitamin with minerals  1 tablet Oral Daily  . predniSONE  50 mg Oral Daily  . venlafaxine XR  75 mg Oral Daily   Continuous Infusions: . azithromycin Stopped (07/17/20 2309)  . cefTRIAXone (ROCEPHIN)  IV Stopped (07/18/20 0002)  . remdesivir 100 mg in NS 100 mL Stopped (07/18/20 0909)     LOS: 3 days    Time spent: 25 minutes    Tresa Moore, MD [facility] Pager [phone]  If 7PM-7AM, please contact night-coverage 07/18/2020, 11:12 AM

## 2020-07-18 NOTE — Evaluation (Signed)
Occupational Therapy Evaluation Patient Details Name: Lauren Velazquez MRN: [id number] DOB: 02/24/1961 Today's Date: 07/18/2020    History of Present Illness Pt is a 59 year old female with history significant for hypertension, hyperlipidemia, morbid obesity, per pt home O2 use (3L) who presents for evaluation of shortness of breath.  Found to be hypoxic down to the 60s on arrival.  Requiring high flow nasal cannula 15L to maintain saturations. Admitted for multifocal PNA 2/2 CV-19+, suspected superimposed bacterial PNA, and acute respiratory failure with hypoxia 2/2 covid PNA. Weaned to 7L HNC as of 07/15/20.   Clinical Impression   Ms Wist was seen for OT evaluation this date. Prior to hospital admission, pt was MOD I using 3L [place] for ADLs/mobility. Pt lives c husband in 1 level home c 3 STE. Pt presents to acute OT demonstrating impaired ADL performance and functional mobility 2/2 decreased activity tolerance and functional strength deficits. Pt currently requires SUPERVISION only for standing grooming tasks - assist 2/2 desat c activity. SpO2 mid 90s on 5L [place], desat 87% c ~32min standing, resolved to 90s c seated rest. Pt denies concerns for return home. No skilled acute OT needs, will sign off. Upon hospital discharge, recommend no OT follow up needs.     Follow Up Recommendations  No OT follow up    Equipment Recommendations       Recommendations for Other Services       Precautions / Restrictions Precautions Precautions: Fall Restrictions Weight Bearing Restrictions: No      Mobility Bed Mobility Overal bed mobility: Modified Independent       Transfers Overall transfer level: Modified independent    General transfer comment: rises easily w/o physical assist     Balance Overall balance assessment: Mild deficits observed, not formally tested        ADL either performed or assessed with clinical judgement   ADL Overall ADL's : Needs assistance/impaired       General ADL Comments: SUPERVISION only for standing grooming tasks - assist 2/2 desat c activity                   Pertinent Vitals/Pain Pain Assessment: No/denies pain     Hand Dominance     Extremity/Trunk Assessment Upper Extremity Assessment Upper Extremity Assessment: Overall WFL for tasks assessed   Lower Extremity Assessment Lower Extremity Assessment: Generalized weakness   Cervical / Trunk Assessment Cervical / Trunk Assessment: Normal   Communication Communication Communication: No difficulties   Cognition Arousal/Alertness: Awake/alert Behavior During Therapy: WFL for tasks assessed/performed Overall Cognitive Status: Within Functional Limits for tasks assessed          General Comments  SpO2 mid 90s on 5L , desat 87% c ~50min standing, resolved to 90s c seated rest    Exercises Exercises: Other exercises Other Exercises Other Exercises: Pt educated re: OT role, DME recs, d/c recs, falls prevention, ECS Other Exercises: simulated UB/LBD, sup<>sit, sit<>stand, sitting/standing balance/toelrance   Shoulder Instructions      Home Living Family/patient expects to be discharged to:: Private residence Living Arrangements: Spouse/significant other Available Help at Discharge: Family;Available 24 hours/day Type of Home: House Home Access: Stairs to enter [employer] of Steps: 3 Entrance Stairs-Rails: Right Home Layout: One level               Home Equipment: Walker - standard   Additional Comments: no falls, uses oxygen at home (3L)      Prior Functioning/Environment Level of Independence: Independent  Comments: works part time        OT Problem List: Decreased activity tolerance      OT Treatment/Interventions:      OT Goals(Current goals can be found in the care plan section) Acute Rehab OT Goals Patient Stated Goal: to feel better OT Goal Formulation: With patient Time For Goal Achievement:  08/01/20 Potential to Achieve Goals: Good   AM-PAC OT "6 Clicks" Daily Activity     Outcome Measure Help from another person eating meals?: None Help from another person taking care of personal grooming?: A Little Help from another person toileting, which includes using toliet, bedpan, or urinal?: A Little Help from another person bathing (including washing, rinsing, drying)?: A Little Help from another person to put on and taking off regular upper body clothing?: None Help from another person to put on and taking off regular lower body clothing?: None 6 Click Score: 21   End of Session    Activity Tolerance: Patient tolerated treatment well Patient left: in bed;with call bell/phone within reach  OT Visit Diagnosis: Other abnormalities of gait and mobility (R26.89)                Time: 2395-3202 OT Time Calculation (min): 6 min Charges:  OT General Charges $OT Visit: 1 Visit OT Evaluation $OT Eval Low Complexity: 1 Low  Kathie Dike, M.S. OTR/L  07/18/20, 9:03 AM  ascom [phone]

## 2020-07-19 LAB — GLUCOSE, CAPILLARY
Glucose-Capillary: 130 mg/dL — ABNORMAL HIGH (ref 70–99)
Glucose-Capillary: 44 mg/dL — CL (ref 70–99)
Glucose-Capillary: 102 mg/dL — ABNORMAL HIGH (ref 70–99)
Glucose-Capillary: 58 mg/dL — ABNORMAL LOW (ref 70–99)
Glucose-Capillary: 212 mg/dL — ABNORMAL HIGH (ref 70–99)
Glucose-Capillary: 206 mg/dL — ABNORMAL HIGH (ref 70–99)

## 2020-07-19 LAB — CBC WITH DIFFERENTIAL/PLATELET
Abs Immature Granulocytes: 0.05 10*3/uL (ref 0.00–0.07)
Basophils Absolute: 0 10*3/uL (ref 0.0–0.1)
Basophils Relative: 0 %
Eosinophils Absolute: 0.1 10*3/uL (ref 0.0–0.5)
Eosinophils Relative: 1 %
HCT: 34.6 % — ABNORMAL LOW (ref 36.0–46.0)
Hemoglobin: 9.2 g/dL — ABNORMAL LOW (ref 12.0–15.0)
Immature Granulocytes: 1 %
Lymphocytes Relative: 17 %
Lymphs Abs: 1.3 10*3/uL (ref 0.7–4.0)
MCH: 19.5 pg — ABNORMAL LOW (ref 26.0–34.0)
MCHC: 26.6 g/dL — ABNORMAL LOW (ref 30.0–36.0)
MCV: 73.3 fL — ABNORMAL LOW (ref 80.0–100.0)
Monocytes Absolute: 0.5 10*3/uL (ref 0.1–1.0)
Monocytes Relative: 7 %
Neutro Abs: 5.8 10*3/uL (ref 1.7–7.7)
Neutrophils Relative %: 74 %
Platelets: 250 10*3/uL (ref 150–400)
RBC: 4.72 MIL/uL (ref 3.87–5.11)
RDW: 19.2 % — ABNORMAL HIGH (ref 11.5–15.5)
WBC: 7.7 10*3/uL (ref 4.0–10.5)
nRBC: 0.4 % — ABNORMAL HIGH (ref 0.0–0.2)

## 2020-07-19 LAB — FIBRIN DERIVATIVES D-DIMER (ARMC ONLY): Fibrin derivatives D-dimer (ARMC): 639.08 ng/mL (FEU) — ABNORMAL HIGH (ref 0.00–499.00)

## 2020-07-19 LAB — COMPREHENSIVE METABOLIC PANEL
ALT: 26 U/L (ref 0–44)
AST: 39 U/L (ref 15–41)
Albumin: 2.4 g/dL — ABNORMAL LOW (ref 3.5–5.0)
Alkaline Phosphatase: 50 U/L (ref 38–126)
Anion gap: 5 (ref 5–15)
BUN: 14 mg/dL (ref 6–20)
CO2: 38 mmol/L — ABNORMAL HIGH (ref 22–32)
Calcium: 8.1 mg/dL — ABNORMAL LOW (ref 8.9–10.3)
Chloride: 99 mmol/L (ref 98–111)
Creatinine, Ser: 0.69 mg/dL (ref 0.44–1.00)
GFR, Estimated: >60 mL/min (ref >60)
Glucose, Bld: 91 mg/dL (ref 70–99)
Potassium: 3.4 mmol/L — ABNORMAL LOW (ref 3.5–5.1)
Sodium: 142 mmol/L (ref 135–145)
Total Bilirubin: 0.5 mg/dL (ref 0.3–1.2)
Total Protein: 5.3 g/dL — ABNORMAL LOW (ref 6.5–8.1)

## 2020-07-19 LAB — C-REACTIVE PROTEIN: CRP: 0.6 mg/dL (ref <1.0)

## 2020-07-19 MED ORDER — POTASSIUM CHLORIDE CRYS ER 20 MEQ PO TBCR
40.0000 meq | EXTENDED_RELEASE_TABLET | Freq: Once | ORAL | Status: AC
  Administered 2020-07-19: 40 meq via ORAL
  Filled 2020-07-19: qty 2

## 2020-07-19 MED ORDER — INSULIN GLARGINE 100 UNIT/ML ~~LOC~~ SOLN
70.0000 [IU] | Freq: Two times a day (BID) | SUBCUTANEOUS | Status: DC
  Administered 2020-07-19 – 2020-07-21 (×4): 70 [IU] via SUBCUTANEOUS
  Filled 2020-07-19 (×5): qty 0.7

## 2020-07-19 MED ORDER — BENZONATATE 100 MG PO CAPS
200.0000 mg | ORAL_CAPSULE | Freq: Three times a day (TID) | ORAL | Status: DC
  Administered 2020-07-19 – 2020-07-22 (×9): 200 mg via ORAL
  Filled 2020-07-19 (×10): qty 2

## 2020-07-19 MED ORDER — FUROSEMIDE 40 MG PO TABS
40.0000 mg | ORAL_TABLET | Freq: Once | ORAL | Status: AC
  Administered 2020-07-19: 13:00:00 40 mg via ORAL
  Filled 2020-07-19: qty 1

## 2020-07-19 MED ORDER — FUROSEMIDE 10 MG/ML IJ SOLN: 40.0000 mg | Freq: Once | INTRAMUSCULAR | Status: DC

## 2020-07-19 NOTE — Progress Notes (Signed)
PROGRESS NOTE    Lauren Velazquez  TMH:[id number] DOB: 1960-11-30 DOA: 07/14/2020 PCP: Hillery Aldo, MD   Brief Narrative:  59 year old female with history significant for hypertension, hyperlipidemia, morbid obesity who presents for evaluation of shortness of breath.  Found to be hypoxic down to the 60s on arrival.  Requiring high flow nasal cannula 15L to maintain saturations.  Also possibility of concomitant bacterial infection given elevated procalcitonin so started on empiric community-acquired pneumonia coverage. 12/2: Patient's respiratory status improving.  Weaned to 7 L high flow nasal cannula.  No fevers over interval.  Normal work of breathing.  Has not been adherent to incentive spirometry and flutter valve use 12/3: Weaned to 5 L nasal cannula.  Shortness of breath and respiratory status improving 12/5: Continues to slowly improve.  Weaned to 3 L nasal cannula   Assessment & Plan:   Principal Problem:   Pneumonia due to COVID-19 virus Active Problems:   Diabetes mellitus type 2, uncomplicated (HCC)   Hypertension   Acute respiratory failure due to COVID-19 (HCC)   Obesity, Class III, BMI 40-49.9 (morbid obesity) (HCC)   Chronic prescription opiate use   Bacterial pneumonia   Acute hypoxemic respiratory failure due to COVID-19 [facility])  Multifocal pneumonia due to COVID-19 Suspected superimposed bacterial pneumonia Acute respiratory failure with hypoxia, secondary to above Patient initially was markedly hypoxic to 64% on arrival Has now been weaned from high flow nasal cannula at 15 L 12/5, weaned to 3 L Completed 3-day course and was up with the Romycin Completed 5-day course of Rocephin Plan: Continue remdesivir, last dose today, 5-day course Continue steroids, dose 4/10 Wean oxygen as tolerated Antitussives as needed Stress I-S and flutter use Prone as tolerated Daily inflammatory markers Mobilize as tolerated Repeat Lasix challenge, 40 mg IV x1.  Reassess daily  for diuretic need and tolerance  Covid gastroenteritis Patient with nausea vomiting and diarrhea in the past 2 weeks Supportive care Appears to be improving Tolerating p.o. As needed antiemetics and antispasmodics  Hyperlipidemia -Not currently on statin  Hypertension  -Continue home lisinopril  Morbid obesity -BMI 47 complicated factor to prognosis and care  Diabetes mellitusinsulin dependent Basal bolus regimen Carb modified diet  Depression and insomnia  Continue Effexor and trazodone   Chronic pain on chronic opioids Continue home narcotic regimen  DVT prophylaxis: Lovenox Code Status: Full Family Communication: Husband Ali Lowe [phone] on 07/18/2020 Disposition Plan: Status is: Inpatient  Remains inpatient appropriate because:Inpatient level of care appropriate due to severity of illness   Dispo: The patient is from: Home              Anticipated d/c is to: Home              Anticipated d/c date is: 1 day              Patient currently is not medically stable to d/c.   Patient improving but not medically stable for discharge at this time.  Attempting to wean entirely from supplemental oxygen.  If at 2 L tomorrow will ambulate and possibly discharge home with home oxygen if necessary.  Anticipated date of discharge 07/20/2020.  Consultants:   None  Procedures:   None  Antimicrobials:     Subjective: Patient seen and examined.  Shortness of breath improving.  Hemodynamically stable.  No fevers over interval.  Weaned to 3 L nasal cannula.  Objective: Vitals:   07/18/20 2113 07/18/20 2357 07/19/20 0530 07/19/20 0805  BP: (!) 169/73 [name])  144/70 (!) 148/70 133/71  Pulse: (!) 55 (!) 59 60 (!) 55  Resp: 18 16 18 20   Temp: 99 F (37.2 C) 98.3 F (36.8 C) 97.7 F (36.5 C) 97.9 F (36.6 C)  TempSrc: Oral Oral Oral Oral  SpO2: 96% 95% 98% 100%  Weight:      Height:       No intake or output data in the 24 hours ending 07/19/20  1045 Filed Weights   07/16/20 0559 07/17/20 0337 07/17/20 1907  Weight: 104.2 kg 107.4 kg 104.9 kg    Examination:  General exam: No acute distress.  Appears fatigued Respiratory system: Normal work of breathing.  Bibasilar crackles. 3 L cardiovascular system: S1 & S2 heard, RRR. No JVD, murmurs, rubs, gallops or clicks. No pedal edema. Gastrointestinal system: Obese, nontender, nondistended, normal bowel sounds  Central nervous system: Alert and oriented. No focal neurological deficits. Extremities: Symmetric 5 x 5 power. Skin: No rashes, lesions or ulcers Psychiatry: Judgement and insight appear normal. Mood & affect appropriate.     Data Reviewed: I have personally reviewed following labs and imaging studies  CBC: Recent Labs  Lab 07/15/20 0058 07/16/20 0349 07/17/20 0410 07/18/20 0440 07/19/20 0741  WBC 5.0 3.9* 5.1 4.7 7.7  NEUTROABS 3.4 3.0 4.2 3.9 5.8  HGB 10.1* 9.2* 9.0* 8.7* 9.2*  HCT 38.4 33.6* 32.9* 32.7* 34.6*  MCV 73.6* 71.9* 72.6* 73.2* 73.3*  PLT 299 275 245 234 250   Basic Metabolic Panel: Recent Labs  Lab 07/15/20 0058 07/16/20 0349 07/17/20 0410 07/18/20 0440 07/19/20 0741  NA 142 138 140 137 142  K 4.6 4.3 4.3 4.4 3.4*  CL 98 99 99 97* 99  CO2 35* 29 30 33* 38*  GLUCOSE 84 351* 226* 279* 91  BUN 9 17 18 18 14   CREATININE 0.65 0.69 0.63 0.53 0.69  CALCIUM 8.5* 8.3* 8.1* 8.1* 8.1*   GFR: Estimated Creatinine Clearance: 88.2 mL/min (by C-G formula based on SCr of 0.69 mg/dL). Liver Function Tests: Recent Labs  Lab 07/15/20 0058 07/16/20 0349 07/17/20 0410 07/18/20 0440 07/19/20 0741  AST 25 16 15 16  39  ALT 14 10 10 13 26   ALKPHOS 82 71 60 57 50  BILITOT 1.3* 0.5 0.4 0.5 0.5  PROT 6.9 6.2* 6.0* 5.8* 5.3*  ALBUMIN 2.7* 2.5* 2.4* 2.5* 2.4*   No results for input(s): LIPASE, AMYLASE in the last 168 hours. No results for input(s): AMMONIA in the last 168 hours. Coagulation Profile: No results for input(s): INR, PROTIME in the last  168 hours. Cardiac Enzymes: No results for input(s): CKTOTAL, CKMB, CKMBINDEX, TROPONINI in the last 168 hours. BNP (last 3 results) No results for input(s): PROBNP in the last 8760 hours. HbA1C: No results for input(s): HGBA1C in the last 72 hours. CBG: Recent Labs  Lab 07/18/20 0747 07/18/20 1153 07/18/20 1640 07/18/20 2114 07/19/20 0803  GLUCAP 297* 307* 323* 281* 130*   Lipid Profile: No results for input(s): CHOL, HDL, LDLCALC, TRIG, CHOLHDL, LDLDIRECT in the last 72 hours. Thyroid Function Tests: No results for input(s): TSH, T4TOTAL, FREET4, T3FREE, THYROIDAB in the last 72 hours. Anemia Panel: Recent Labs    07/17/20 0410 07/18/20 0440  FERRITIN 24 21   Sepsis Labs: Recent Labs  Lab 07/15/20 0058  PROCALCITON 0.36  LATICACIDVEN 1.6    No results found for this or any previous visit (from the past 240 hour(s)).       Radiology Studies: No results found.      Scheduled  Meds: . albuterol  2 puff Inhalation Q6H  . benzonatate  200 mg Oral TID  . enoxaparin (LOVENOX) injection  0.5 mg/kg Subcutaneous Q24H  . gabapentin  300 mg Oral BID  . insulin aspart  0-20 Units Subcutaneous TID WC  . insulin aspart  0-5 Units Subcutaneous QHS  . insulin aspart  15 Units Subcutaneous TID WC  . insulin glargine  85 Units Subcutaneous BID  . linagliptin  5 mg Oral Daily  . lisinopril  40 mg Oral Daily  . multivitamin with minerals  1 tablet Oral Daily  . predniSONE  50 mg Oral Daily  . venlafaxine XR  75 mg Oral Daily   Continuous Infusions: . cefTRIAXone (ROCEPHIN)  IV Stopped (07/18/20 0002)     LOS: 4 days    Time spent: 15 minutes    Tresa Moore, MD [facility] Pager [phone]  If 7PM-7AM, please contact night-coverage 07/19/2020, 10:45 AM

## 2020-07-19 NOTE — Progress Notes (Signed)
IV team unable to establish PIV access. MD aware and ok hold off on further attempts at this time. MD order to hold last dose of remdesivir.

## 2020-07-19 NOTE — Progress Notes (Signed)
Repeat CBG 58. Pt given peanut  Butter and cracker because lunch tray not yet here.

## 2020-07-19 NOTE — Progress Notes (Signed)
Notified by NT that pt's CBG is 44. Pt A&O, asymptomatic. Pt currently drinking Coke (regular) and drank one cranberry juice. Will recheck CBG in 30 min. MD notified and insulin adjusted.

## 2020-07-19 NOTE — Progress Notes (Signed)
Pt requesting something for her "burning mouth other than that mouthwash." Pt cannot tolerate mouthwash due to taste. Messaged MD and there is no alternative available. Pt notified and educated about mouthwash. Pt still refuses.

## 2020-07-20 LAB — COMPREHENSIVE METABOLIC PANEL
ALT: 61 U/L — ABNORMAL HIGH (ref 0–44)
AST: 32 U/L (ref 15–41)
Albumin: 2.4 g/dL — ABNORMAL LOW (ref 3.5–5.0)
Alkaline Phosphatase: 62 U/L (ref 38–126)
Anion gap: 5 (ref 5–15)
BUN: 15 mg/dL (ref 6–20)
CO2: 37 mmol/L — ABNORMAL HIGH (ref 22–32)
Calcium: 8 mg/dL — ABNORMAL LOW (ref 8.9–10.3)
Chloride: 98 mmol/L (ref 98–111)
Creatinine, Ser: 0.54 mg/dL (ref 0.44–1.00)
GFR, Estimated: >60 mL/min (ref >60)
Glucose, Bld: 178 mg/dL — ABNORMAL HIGH (ref 70–99)
Potassium: 3.8 mmol/L (ref 3.5–5.1)
Sodium: 140 mmol/L (ref 135–145)
Total Bilirubin: 0.5 mg/dL (ref 0.3–1.2)
Total Protein: 5.5 g/dL — ABNORMAL LOW (ref 6.5–8.1)

## 2020-07-20 LAB — CBC WITH DIFFERENTIAL/PLATELET
Abs Immature Granulocytes: 0.05 10*3/uL (ref 0.00–0.07)
Basophils Absolute: 0 10*3/uL (ref 0.0–0.1)
Basophils Relative: 0 %
Eosinophils Absolute: 0.1 10*3/uL (ref 0.0–0.5)
Eosinophils Relative: 1 %
HCT: 32.8 % — ABNORMAL LOW (ref 36.0–46.0)
Hemoglobin: 8.9 g/dL — ABNORMAL LOW (ref 12.0–15.0)
Immature Granulocytes: 1 %
Lymphocytes Relative: 19 %
Lymphs Abs: 1.3 10*3/uL (ref 0.7–4.0)
MCH: 19.6 pg — ABNORMAL LOW (ref 26.0–34.0)
MCHC: 27.1 g/dL — ABNORMAL LOW (ref 30.0–36.0)
MCV: 72.1 fL — ABNORMAL LOW (ref 80.0–100.0)
Monocytes Absolute: 0.5 10*3/uL (ref 0.1–1.0)
Monocytes Relative: 7 %
Neutro Abs: 5.1 10*3/uL (ref 1.7–7.7)
Neutrophils Relative %: 72 %
Platelets: 283 10*3/uL (ref 150–400)
RBC: 4.55 MIL/uL (ref 3.87–5.11)
RDW: 19.5 % — ABNORMAL HIGH (ref 11.5–15.5)
WBC: 7 10*3/uL (ref 4.0–10.5)
nRBC: 0.3 % — ABNORMAL HIGH (ref 0.0–0.2)

## 2020-07-20 LAB — FIBRIN DERIVATIVES D-DIMER (ARMC ONLY): Fibrin derivatives D-dimer (ARMC): 766.23 ng/mL (FEU) — ABNORMAL HIGH (ref 0.00–499.00)

## 2020-07-20 LAB — C-REACTIVE PROTEIN: CRP: 0.9 mg/dL (ref <1.0)

## 2020-07-20 LAB — GLUCOSE, CAPILLARY
Glucose-Capillary: 119 mg/dL — ABNORMAL HIGH (ref 70–99)
Glucose-Capillary: 101 mg/dL — ABNORMAL HIGH (ref 70–99)
Glucose-Capillary: 270 mg/dL — ABNORMAL HIGH (ref 70–99)
Glucose-Capillary: 342 mg/dL — ABNORMAL HIGH (ref 70–99)

## 2020-07-20 MED ORDER — MAGIC MOUTHWASH
10.0000 mL | Freq: Four times a day (QID) | ORAL | Status: DC | PRN
  Administered 2020-07-20: 10 mL via ORAL
  Filled 2020-07-20: qty 10

## 2020-07-20 MED ORDER — FUROSEMIDE 20 MG PO TABS
20.0000 mg | ORAL_TABLET | Freq: Once | ORAL | Status: AC
  Administered 2020-07-20: 12:00:00 20 mg via ORAL
  Filled 2020-07-20: qty 1

## 2020-07-20 NOTE — Progress Notes (Signed)
SATURATION QUALIFICATIONS: (This note is used to comply with regulatory documentation for home oxygen)  Patient Saturations on Room Air at Rest = 86%  Patient Saturations on 2L while At Rest = 93%  Patient Saturations on 2 Liters of oxygen while Ambulating = 85%  Please briefly explain why patient needs home oxygen: with ambulation

## 2020-07-20 NOTE — TOC Initial Note (Signed)
Transition of Care [facility]) - Initial/Assessment Note    Patient Details  Name: Lauren Velazquez MRN: [id number] Date of Birth: Feb 15, 1961  Transition of Care [facility]) CM/SW Contact:    Allayne Butcher, RN Phone Number: 07/20/2020, 2:16 PM  Clinical Narrative:                 Patient admitted to the hospital with COVID.  RNCM was able to meet with patient at the bedside.  Patient reports that she has oxygen at home through [facility], set up by her PCP, Dr. Allena Katz about a month ago.  Patient is from home with her husband, she is independent and drives.  PT has recommended home health but patient declines home health services at discharge.  Potential discharge tomorrow. No other needs identified at this time.   Expected Discharge Plan: Home/Self Care Barriers to Discharge: Continued Medical Work up   Patient Goals and CMS Choice Patient states their goals for this hospitalization and ongoing recovery are:: To return home      Expected Discharge Plan and Services Expected Discharge Plan: Home/Self Care   Discharge Planning Services: CM Consult   Living arrangements for the past 2 months: Single Family Home                   DME Agency: NA       HH Arranged: Patient Refused HH          Prior Living Arrangements/Services Living arrangements for the past 2 months: Single Family Home Lives with:: Spouse Patient language and need for interpreter reviewed:: Yes Do you feel safe going back to the place where you live?: Yes      Need for Family Participation in Patient Care: Yes (Comment) Care giver support system in place?: Yes (comment) Current home services: DME (oxygen with [facility]) Criminal Activity/Legal Involvement Pertinent to Current Situation/Hospitalization: No - Comment as needed  Activities of Daily Living Home Assistive Devices/Equipment: Blood pressure cuff, Cane (specify quad or straight), CBG Meter, Eyeglasses, Grab bars around toilet, Oxygen, Scales ADL Screening  (condition at time of admission) Patient's cognitive ability adequate to safely complete daily activities?: Yes Is the patient deaf or have difficulty hearing?: No Does the patient have difficulty seeing, even when wearing glasses/contacts?: Yes Does the patient have difficulty concentrating, remembering, or making decisions?: Yes Patient able to express need for assistance with ADLs?: No Does the patient have difficulty dressing or bathing?: No Independently performs ADLs?: Yes (appropriate for developmental age) Does the patient have difficulty walking or climbing stairs?: Yes Weakness of Legs: Both Weakness of Arms/Hands: Both  Permission Sought/Granted Permission sought to share information with : Case Manager, Family Supports Permission granted to share information with : Yes, Verbal Permission Granted  Share Information with NAME: Maisie Fus     Permission granted to share info w Relationship: husband     Emotional Assessment Appearance:: Appears stated age, Disheveled Attitude/Demeanor/Rapport: Engaged Affect (typically observed): Accepting Orientation: : Oriented to Self, Oriented to Place, Oriented to  Time, Oriented to Situation Alcohol / Substance Use: Not Applicable Psych Involvement: No (comment)  Admission diagnosis:  Morbid obesity (HCC) [E66.01] Acute respiratory failure with hypoxia (HCC) [J96.01] Type 2 diabetes mellitus without complication, with long-term current use of insulin (HCC) [E11.9, Z79.4] Acute respiratory failure due to COVID-19 (HCC) [U07.1, J96.00] Acute hypoxemic respiratory failure due to COVID-19 (HCC) [U07.1, J96.01] Pneumonia due to COVID-19 virus [U07.1, J12.82] Patient Active Problem List   Diagnosis Date Noted  . Pneumonia due to  COVID-19 virus 07/15/2020  . Acute respiratory failure due to COVID-19 (HCC) 07/15/2020  . Obesity, Class III, BMI 40-49.9 (morbid obesity) (HCC) 07/15/2020  . Chronic prescription opiate use 07/15/2020  .  Bacterial pneumonia 07/15/2020  . Acute hypoxemic respiratory failure due to COVID-19 (HCC) 07/15/2020  . Suspected COVID-19 virus infection 03/06/2020  . Elevated troponin 03/06/2020  . Acute respiratory failure with hypoxia (HCC) 03/05/2020  . Diabetes mellitus type 2, uncomplicated (HCC) 05/30/2014  . Hyperlipidemia 05/30/2014  . Hypertension 05/30/2014  . Obesity 05/30/2014  . Vertigo 05/30/2014  . Carpal tunnel syndrome on both sides 12/27/2013   PCP:  Hillery Aldo, MD Pharmacy:   [facility] - [place], [place] - [address] [place] [address] Phone: [phone] Fax: [phone]  [id number] [place], [place] - [address] AT [facility] [address] [place] [address] Phone: [phone] Fax: [phone]     Social Determinants of Health (SDOH) Interventions    Readmission Risk Interventions No flowsheet data found.

## 2020-07-20 NOTE — Progress Notes (Signed)
PROGRESS NOTE    Lauren Velazquez  ION:[id number] DOB: [birth year] DOA: 07/14/2020 PCP: Hillery Aldo, MD   Brief Narrative:  59 year old female with history significant for hypertension, hyperlipidemia, morbid obesity who presents for evaluation of shortness of breath.  Found to be hypoxic down to the 60s on arrival.  Requiring high flow nasal cannula 15L to maintain saturations.  Also possibility of concomitant bacterial infection given elevated procalcitonin so started on empiric community-acquired pneumonia coverage. 12/2: Patient's respiratory status improving.  Weaned to 7 L high flow nasal cannula.  No fevers over interval.  Normal work of breathing.  Has not been adherent to incentive spirometry and flutter valve use 12/3: Weaned to 5 L nasal cannula.  Shortness of breath and respiratory status improving 12/5: Continues to slowly improve.  Weaned to 3 L nasal cannula 12/6: Continues to improve slowly.  Wean to 2 L nasal cannula.   Assessment & Plan:   Principal Problem:   Pneumonia due to COVID-19 virus Active Problems:   Diabetes mellitus type 2, uncomplicated (HCC)   Hypertension   Acute respiratory failure due to COVID-19 (HCC)   Obesity, Class III, BMI 40-49.9 (morbid obesity) (HCC)   Chronic prescription opiate use   Bacterial pneumonia   Acute hypoxemic respiratory failure due to COVID-19 [facility])  Multifocal pneumonia due to COVID-19 Suspected superimposed bacterial pneumonia Acute respiratory failure with hypoxia, secondary to above Patient initially was markedly hypoxic to 64% on arrival Has now been weaned from high flow nasal cannula at 15 L Completed 3-day course and was up with the Romycin Completed 5-day course of Rocephin Completed remdesivir, 5-day course 12/6: Weaned to 2 L Plan: Continue steroids, dose 5/10 Wean oxygen as tolerated Antitussives as needed Stress I-S and flutter use Prone as tolerated Daily inflammatory markers Mobilize as  tolerated Repeat Lasix challenge, 20 mg IV x1.  Reassess daily for diuretic need and tolerance  Covid gastroenteritis Patient with nausea vomiting and diarrhea in the past 2 weeks Supportive care Appears to be improving Tolerating p.o. As needed antiemetics and antispasmodics  Hyperlipidemia -Not currently on statin  Hypertension  -Continue home lisinopril  Morbid obesity -BMI 47 complicated factor to prognosis and care  Diabetes mellitusinsulin dependent Basal bolus regimen Carb modified diet  Depression and insomnia  Continue Effexor and trazodone   Chronic pain on chronic opioids Continue home narcotic regimen  DVT prophylaxis: Lovenox Code Status: Full Family Communication: Husband Ali Lowe [phone] on 07/18/2020 Disposition Plan: Status is: Inpatient  Remains inpatient appropriate because:Inpatient level of care appropriate due to severity of illness   Dispo: The patient is from: Home              Anticipated d/c is to: Home              Anticipated d/c date is: 1 day              Patient currently is not medically stable to d/c.   Remains hypoxic and symptomatic.  Will attempt to wean entirely from submental oxygen prior to discharge.  Anticipate discharge home 07/21/2020.       Consultants:   None  Procedures:   None  Antimicrobials:     Subjective: Patient seen and examined.  Shortness of breath slowly improving.  Hemodynamically stable.  No fevers over interval.  Mild cough persisted.  Weaned to 2 L nasal cannula.  Objective: Vitals:   07/20/20 0300 07/20/20 0427 07/20/20 0527 07/20/20 0805  BP:   (!) 173/95 (!) 160/77  Pulse:   62 (!) 57  Resp:   18 18  Temp:   98.7 F (37.1 C) 98.3 F (36.8 C)  TempSrc:      SpO2: 95% 96% 97% 97%  Weight:      Height:       No intake or output data in the 24 hours ending 07/20/20 1019 Filed Weights   07/16/20 0559 07/17/20 0337 07/17/20 1907  Weight: 104.2 kg 107.4 kg 104.9 kg     Examination:  General exam: No acute distress.  Appears fatigued Respiratory system: Normal work of breathing.  Bibasilar crackles. 3 L cardiovascular system: S1 & S2 heard, RRR. No JVD, murmurs, rubs, gallops or clicks. No pedal edema. Gastrointestinal system: Obese, nontender, nondistended, normal bowel sounds  Central nervous system: Alert and oriented. No focal neurological deficits. Extremities: Symmetric 5 x 5 power. Skin: No rashes, lesions or ulcers Psychiatry: Judgement and insight appear normal. Mood & affect appropriate.     Data Reviewed: I have personally reviewed following labs and imaging studies  CBC: Recent Labs  Lab 07/16/20 0349 07/17/20 0410 07/18/20 0440 07/19/20 0741 07/20/20 0440  WBC 3.9* 5.1 4.7 7.7 7.0  NEUTROABS 3.0 4.2 3.9 5.8 5.1  HGB 9.2* 9.0* 8.7* 9.2* 8.9*  HCT 33.6* 32.9* 32.7* 34.6* 32.8*  MCV 71.9* 72.6* 73.2* 73.3* 72.1*  PLT 275 245 234 250 283   Basic Metabolic Panel: Recent Labs  Lab 07/16/20 0349 07/17/20 0410 07/18/20 0440 07/19/20 0741 07/20/20 0440  NA 138 140 137 142 140  K 4.3 4.3 4.4 3.4* 3.8  CL 99 99 97* 99 98  CO2 29 30 33* 38* 37*  GLUCOSE 351* 226* 279* 91 178*  BUN 17 18 18 14 15   CREATININE 0.69 0.63 0.53 0.69 0.54  CALCIUM 8.3* 8.1* 8.1* 8.1* 8.0*   GFR: Estimated Creatinine Clearance: 88.2 mL/min (by C-G formula based on SCr of 0.54 mg/dL). Liver Function Tests: Recent Labs  Lab 07/16/20 0349 07/17/20 0410 07/18/20 0440 07/19/20 0741 07/20/20 0440  AST 16 15 16  39 32  ALT 10 10 13 26  61*  ALKPHOS 71 60 57 50 62  BILITOT 0.5 0.4 0.5 0.5 0.5  PROT 6.2* 6.0* 5.8* 5.3* 5.5*  ALBUMIN 2.5* 2.4* 2.5* 2.4* 2.4*   No results for input(s): LIPASE, AMYLASE in the last 168 hours. No results for input(s): AMMONIA in the last 168 hours. Coagulation Profile: No results for input(s): INR, PROTIME in the last 168 hours. Cardiac Enzymes: No results for input(s): CKTOTAL, CKMB, CKMBINDEX, TROPONINI in the  last 168 hours. BNP (last 3 results) No results for input(s): PROBNP in the last 8760 hours. HbA1C: No results for input(s): HGBA1C in the last 72 hours. CBG: Recent Labs  Lab 07/19/20 1230 07/19/20 1324 07/19/20 1620 07/19/20 2057 07/20/20 0805  GLUCAP 58* 102* 212* 206* 119*   Lipid Profile: No results for input(s): CHOL, HDL, LDLCALC, TRIG, CHOLHDL, LDLDIRECT in the last 72 hours. Thyroid Function Tests: No results for input(s): TSH, T4TOTAL, FREET4, T3FREE, THYROIDAB in the last 72 hours. Anemia Panel: Recent Labs    07/18/20 0440  FERRITIN 21   Sepsis Labs: Recent Labs  Lab 07/15/20 0058  PROCALCITON 0.36  LATICACIDVEN 1.6    No results found for this or any previous visit (from the past 240 hour(s)).       Radiology Studies: No results found.      Scheduled Meds: . albuterol  2 puff Inhalation Q6H  . benzonatate  200 mg Oral TID  .  enoxaparin (LOVENOX) injection  0.5 mg/kg Subcutaneous Q24H  . gabapentin  300 mg Oral BID  . insulin aspart  0-20 Units Subcutaneous TID WC  . insulin aspart  0-5 Units Subcutaneous QHS  . insulin glargine  70 Units Subcutaneous BID  . linagliptin  5 mg Oral Daily  . lisinopril  40 mg Oral Daily  . multivitamin with minerals  1 tablet Oral Daily  . predniSONE  50 mg Oral Daily  . venlafaxine XR  75 mg Oral Daily   Continuous Infusions:    LOS: 5 days    Time spent: 15 minutes    Tresa Moore, MD [facility] Pager [phone]  If 7PM-7AM, please contact night-coverage 07/20/2020, 10:18 AM

## 2020-07-20 NOTE — Progress Notes (Signed)
PT Cancellation Note  Patient Details Name: Lauren Velazquez MRN: [id number] DOB: 04-30-1961   Cancelled Treatment:    Reason Eval/Treat Not Completed: Patient declined, no reason specified. Patient initially agreeable and then refused therapy, reporting she has been up already today. PT will continue with attempts.   Donna Bernard, PT, MPT  Ina Homes 07/20/2020, 12:58 PM

## 2020-07-20 NOTE — Plan of Care (Signed)

## 2020-07-21 LAB — GLUCOSE, CAPILLARY
Glucose-Capillary: 77 mg/dL (ref 70–99)
Glucose-Capillary: 86 mg/dL (ref 70–99)
Glucose-Capillary: 294 mg/dL — ABNORMAL HIGH (ref 70–99)
Glucose-Capillary: 313 mg/dL — ABNORMAL HIGH (ref 70–99)

## 2020-07-21 MED ORDER — INSULIN GLARGINE 100 UNIT/ML ~~LOC~~ SOLN
65.0000 [IU] | Freq: Two times a day (BID) | SUBCUTANEOUS | Status: DC
  Administered 2020-07-21: 22:00:00 65 [IU] via SUBCUTANEOUS
  Filled 2020-07-21 (×3): qty 0.65

## 2020-07-21 MED ORDER — FUROSEMIDE 40 MG PO TABS
40.0000 mg | ORAL_TABLET | Freq: Once | ORAL | Status: AC
  Administered 2020-07-21: 40 mg via ORAL
  Filled 2020-07-21: qty 1

## 2020-07-21 MED ORDER — HYDRALAZINE HCL 20 MG/ML IJ SOLN: 10.0000 mg | INTRAMUSCULAR | Status: DC | PRN

## 2020-07-21 MED ORDER — AMLODIPINE BESYLATE 5 MG PO TABS
5.0000 mg | ORAL_TABLET | Freq: Every day | ORAL | Status: DC
  Administered 2020-07-21 – 2020-07-22 (×2): 5 mg via ORAL
  Filled 2020-07-21 (×2): qty 1

## 2020-07-21 NOTE — Progress Notes (Signed)
Physical Therapy Treatment Patient Details Name: Lauren Velazquez MRN: [id number] DOB: 1960/11/27 Today's Date: 07/21/2020    History of Present Illness Pt is a 59 year old female with history significant for hypertension, hyperlipidemia, morbid obesity, per pt home O2 use (3L) who presents for evaluation of shortness of breath.  Found to be hypoxic down to the 60s on arrival.  Requiring high flow nasal cannula 15L to maintain saturations. Admitted for multifocal PNA 2/2 CV-19+, suspected superimposed bacterial PNA, and acute respiratory failure with hypoxia 2/2 covid PNA. Weaned to 7L HNC as of 07/15/20.    PT Comments    Patient agreeable to PT with encouragement. Patient has been mobilizing around the room and to the bathroom without assistance from staff. Patient is currently independent with mobility and ambulated 79f without assistive device. No dizziness reported with upright activity. Patient ambulated on room air with Sp02 decreasing briefly to 86% and increasing to 91% with less than one minute seated rest break. Patient educated on energy conservation and ambulating for endurance/conditoning. Patient is anticipated to discharge home soon. No further acute PT needs at this time. HHPT recommended for home safety assessment if possible.     Follow Up Recommendations  Home health PT     Equipment Recommendations  None recommended by PT    Recommendations for Other Services       Precautions / Restrictions Precautions Precautions: Fall Restrictions Weight Bearing Restrictions: No    Mobility  Bed Mobility Overal bed mobility: Independent                Transfers Overall transfer level: Independent     Sit to Stand: Independent         General transfer comment: patient has been mobilizing in the room independently. transfers performed from the bed without difficulty   Ambulation/Gait Ambulation/Gait assistance: Independent Gait Distance (Feet): 50  Feet Assistive device: None Gait Pattern/deviations: Step-through pattern Gait velocity: decreased   General Gait Details: patient ambulated without assistive device on room air. Sp02 decreased after walking to 86% briefly and increases to 91% with short seated rest break. no dizziness reported. educated patient on energy conservation techniques and ambulating for endurance and conditioning    Stairs             Wheelchair Mobility    Modified Rankin (Stroke Patients Only)       Balance     Sitting balance-Leahy Scale: Normal       Standing balance-Leahy Scale: Good Standing balance comment: dynamic standing balance without UE support. no trunk sway or loss of balance                             Cognition Arousal/Alertness: Awake/alert Behavior During Therapy: WFL for tasks assessed/performed Overall Cognitive Status: Within Functional Limits for tasks assessed                                        Exercises Other Exercises Other Exercises: patient declined home exercise program at this time     General Comments        Pertinent Vitals/Pain Pain Assessment: No/denies pain    Home Living                      Prior Function            PT  Goals (current goals can now be found in the care plan section) Acute Rehab PT Goals Patient Stated Goal: to go home  PT Goal Formulation:  (goals met or adequate for discharge ) Progress towards PT goals: Goals met/education completed, patient discharged from PT    Frequency           PT Plan      Co-evaluation              AM-PAC PT "6 Clicks" Mobility   Outcome Measure  Help needed turning from your back to your side while in a flat bed without using bedrails?: None Help needed moving from lying on your back to sitting on the side of a flat bed without using bedrails?: None Help needed moving to and from a bed to a chair (including a wheelchair)?: None Help  needed standing up from a chair using your arms (e.g., wheelchair or bedside chair)?: None Help needed to walk in hospital room?: None Help needed climbing 3-5 steps with a railing? : None 6 Click Score: 24    End of Session   Activity Tolerance: Patient tolerated treatment well Patient left: in bed;with call bell/phone within reach   PT Visit Diagnosis: Other abnormalities of gait and mobility (R26.89);Difficulty in walking, not elsewhere classified (R26.2)     Time: 0354-6568 PT Time Calculation (min) (ACUTE ONLY): 19 min  Charges:  $Therapeutic Activity: 8-22 mins                     Minna Merritts, PT, MPT    Percell Locus 07/21/2020, 2:52 PM

## 2020-07-21 NOTE — Progress Notes (Signed)
PROGRESS NOTE    Lauren Velazquez  TIW:[id number] DOB: [birth year] DOA: 07/14/2020 PCP: Hillery Aldo, MD   Brief Narrative:  59 year old female with history significant for hypertension, hyperlipidemia, morbid obesity who presents for evaluation of shortness of breath.  Found to be hypoxic down to the 60s on arrival.  Requiring high flow nasal cannula 15L to maintain saturations.  Also possibility of concomitant bacterial infection given elevated procalcitonin so started on empiric community-acquired pneumonia coverage. 12/2: Patient's respiratory status improving.  Weaned to 7 L high flow nasal cannula.  No fevers over interval.  Normal work of breathing.  Has not been adherent to incentive spirometry and flutter valve use 12/3: Weaned to 5 L nasal cannula.  Shortness of breath and respiratory status improving 12/5: Continues to slowly improve.  Weaned to 3 L nasal cannula 12/6: Continues to improve slowly.  Wean to 2 L nasal cannula. 12/7: Endorsed cough and respiratory distress overnight.  Remains on 2 L nasal cannula.   Assessment & Plan:   Principal Problem:   Pneumonia due to COVID-19 virus Active Problems:   Diabetes mellitus type 2, uncomplicated (HCC)   Hypertension   Acute respiratory failure due to COVID-19 (HCC)   Obesity, Class III, BMI 40-49.9 (morbid obesity) (HCC)   Chronic prescription opiate use   Bacterial pneumonia   Acute hypoxemic respiratory failure due to COVID-19 [facility])  Multifocal pneumonia due to COVID-19 Suspected superimposed bacterial pneumonia Acute respiratory failure with hypoxia, secondary to above Patient initially was markedly hypoxic to 64% on arrival Has now been weaned from high flow nasal cannula at 15 L Completed 3-day course and was up with the Romycin Completed 5-day course of Rocephin Completed remdesivir, 5-day course 12/6: Weaned to 2 L Plan: Continue steroids, dose 6/10 Wean oxygen as tolerated Antitussives as needed Stress I-S and  flutter use Prone as tolerated Daily inflammatory markers Mobilize as tolerated Repeat Lasix challenge, 40 mg IV x1.  Reassess daily for diuretic need and tolerance Anticipate discharge home on 07/22/2020 if respiratory status improved.  Will attempt to wean from supplemental oxygen entirely however may need to ambulate prior to discharge to assess home oxygen needs  Covid gastroenteritis Patient with nausea vomiting and diarrhea in the past 2 weeks Supportive care Appears to be improving Tolerating p.o. As needed antiemetics and antispasmodics  Hyperlipidemia -Not currently on statin  Hypertension  -Continue home lisinopril  Morbid obesity -BMI 47 complicated factor to prognosis and care  Diabetes mellitusinsulin dependent Basal bolus regimen Carb modified diet  Depression and insomnia  Continue Effexor and trazodone   Chronic pain on chronic opioids Continue home narcotic regimen  DVT prophylaxis: Lovenox Code Status: Full Family Communication: Husband Ali Lowe [phone] on 07/18/2020.  Attempted to call on 07/21/2020 however no answer and voicemail not set up Disposition Plan: Status is: Inpatient  Remains inpatient appropriate because:Inpatient level of care appropriate due to severity of illness   Dispo: The patient is from: Home              Anticipated d/c is to: Home              Anticipated d/c date is: 1 day              Patient currently is not medically stable to d/c.   Remains on 2 L.  Patient still endorsing cough and shortness of breath.  Will monitor in-house overnight.  Attempt to wean entirely from supplemental oxygen.  Anticipate that patient may need some home  oxygen on discharge.  Will reattempt ambulatory desaturation test tomorrow 07/22/2020 in preparation for discharge home.  Consultants:   None  Procedures:   None  Antimicrobials:     Subjective: Patient seen and examined.  Continues to endorse cough and shortness of  breath.  Hemodynamically stable.  No fevers over interval.  Remains on 2 L nasal cannula  Objective: Vitals:   07/21/20 0806 07/21/20 0847 07/21/20 0850 07/21/20 0852  BP: (!) 184/88     Pulse: 65     Resp: 20     Temp: 98.6 F (37 C)     TempSrc:      SpO2: 95% (!) 86% (!) 85% 90%  Weight:      Height:       No intake or output data in the 24 hours ending 07/21/20 1015 Filed Weights   07/16/20 0559 07/17/20 0337 07/17/20 1907  Weight: 104.2 kg 107.4 kg 104.9 kg    Examination:  General exam: No acute distress.  Appears fatigued Respiratory system: Bibasilar crackles.  Normal work of breathing.  2 L cardiovascular system: S1 & S2 heard, RRR. No JVD, murmurs, rubs, gallops or clicks. No pedal edema. Gastrointestinal system: Obese, nontender, nondistended, normal bowel sounds  Central nervous system: Alert and oriented. No focal neurological deficits. Extremities: Symmetric 5 x 5 power. Skin: No rashes, lesions or ulcers Psychiatry: Judgement and insight appear normal. Mood & affect appropriate.     Data Reviewed: I have personally reviewed following labs and imaging studies  CBC: Recent Labs  Lab 07/16/20 0349 07/17/20 0410 07/18/20 0440 07/19/20 0741 07/20/20 0440  WBC 3.9* 5.1 4.7 7.7 7.0  NEUTROABS 3.0 4.2 3.9 5.8 5.1  HGB 9.2* 9.0* 8.7* 9.2* 8.9*  HCT 33.6* 32.9* 32.7* 34.6* 32.8*  MCV 71.9* 72.6* 73.2* 73.3* 72.1*  PLT 275 245 234 250 283   Basic Metabolic Panel: Recent Labs  Lab 07/16/20 0349 07/17/20 0410 07/18/20 0440 07/19/20 0741 07/20/20 0440  NA 138 140 137 142 140  K 4.3 4.3 4.4 3.4* 3.8  CL 99 99 97* 99 98  CO2 29 30 33* 38* 37*  GLUCOSE 351* 226* 279* 91 178*  BUN 17 18 18 14 15   CREATININE 0.69 0.63 0.53 0.69 0.54  CALCIUM 8.3* 8.1* 8.1* 8.1* 8.0*   GFR: Estimated Creatinine Clearance: 88.2 mL/min (by C-G formula based on SCr of 0.54 mg/dL). Liver Function Tests: Recent Labs  Lab 07/16/20 0349 07/17/20 0410 07/18/20 0440  07/19/20 0741 07/20/20 0440  AST 16 15 16  39 32  ALT 10 10 13 26  61*  ALKPHOS 71 60 57 50 62  BILITOT 0.5 0.4 0.5 0.5 0.5  PROT 6.2* 6.0* 5.8* 5.3* 5.5*  ALBUMIN 2.5* 2.4* 2.5* 2.4* 2.4*   No results for input(s): LIPASE, AMYLASE in the last 168 hours. No results for input(s): AMMONIA in the last 168 hours. Coagulation Profile: No results for input(s): INR, PROTIME in the last 168 hours. Cardiac Enzymes: No results for input(s): CKTOTAL, CKMB, CKMBINDEX, TROPONINI in the last 168 hours. BNP (last 3 results) No results for input(s): PROBNP in the last 8760 hours. HbA1C: No results for input(s): HGBA1C in the last 72 hours. CBG: Recent Labs  Lab 07/20/20 0805 07/20/20 1126 07/20/20 1525 07/20/20 2123 07/21/20 0805  GLUCAP 119* 101* 270* 342* 77   Lipid Profile: No results for input(s): CHOL, HDL, LDLCALC, TRIG, CHOLHDL, LDLDIRECT in the last 72 hours. Thyroid Function Tests: No results for input(s): TSH, T4TOTAL, FREET4, T3FREE, THYROIDAB in the  last 72 hours. Anemia Panel: No results for input(s): VITAMINB12, FOLATE, FERRITIN, TIBC, IRON, RETICCTPCT in the last 72 hours. Sepsis Labs: Recent Labs  Lab 07/15/20 0058  PROCALCITON 0.36  LATICACIDVEN 1.6    No results found for this or any previous visit (from the past 240 hour(s)).       Radiology Studies: No results found.      Scheduled Meds: . albuterol  2 puff Inhalation Q6H  . benzonatate  200 mg Oral TID  . enoxaparin (LOVENOX) injection  0.5 mg/kg Subcutaneous Q24H  . furosemide  40 mg Oral Once  . gabapentin  300 mg Oral BID  . insulin aspart  0-20 Units Subcutaneous TID WC  . insulin aspart  0-5 Units Subcutaneous QHS  . insulin glargine  70 Units Subcutaneous BID  . lisinopril  40 mg Oral Daily  . multivitamin with minerals  1 tablet Oral Daily  . predniSONE  50 mg Oral Daily  . venlafaxine XR  75 mg Oral Daily   Continuous Infusions:    LOS: 6 days    Time spent: 15  minutes    Tresa Moore, MD [facility] Pager [phone]  If 7PM-7AM, please contact night-coverage 07/21/2020, 10:15 AM

## 2020-07-21 NOTE — Progress Notes (Signed)
Inpatient Diabetes Program Recommendations  AACE/ADA: New Consensus Statement on Inpatient Glycemic Control (2015)  Target Ranges:  Prepandial:   less than 140 mg/dL      Peak postprandial:   less than 180 mg/dL (1-2 hours)      Critically ill patients:  140 - 180 mg/dL   Lab Results  Component Value Date   GLUCAP 86 07/21/2020   HGBA1C 9.1 (H) 03/06/2020    Review of Glycemic Control Results for Lauren Velazquez, Lauren Velazquez (MRN [id number]) as of 07/21/2020 14:35  Ref. Range 07/20/2020 08:05 07/20/2020 11:26 07/20/2020 15:25 07/20/2020 21:23 07/21/2020 08:05 07/21/2020 11:35  Glucose-Capillary Latest Ref Range: 70 - 99 mg/dL 975 (H) 300 (H) 511 (H) 342 (H) 77 86  Diabetes history: Type 2 DM Outpatient Diabetes medications: Lantus 70 units BID, Novolog 0-20 units TID Current orders for Inpatient glycemic control: Lantus 70 units BID,  Novolog 0-20 units TID, Novolog 0-5 units QHS, Tradjenta 5 mg QD Prednisone 50 mg daily Inpatient Diabetes Program Recommendations:   Consider reducing Lantus to 65 units bid and add Novolog meal coverage 3 units tid with meals (hold if patient eats less than 50%).   Thanks,  Beryl Meager, RN, BC-ADM Inpatient Diabetes Coordinator Pager [phone] (8a-5p)

## 2020-07-22 LAB — CREATININE, SERUM
Creatinine, Ser: 0.56 mg/dL (ref 0.44–1.00)
GFR, Estimated: >60 mL/min (ref >60)

## 2020-07-22 LAB — GLUCOSE, CAPILLARY
Glucose-Capillary: 44 mg/dL — CL (ref 70–99)
Glucose-Capillary: 47 mg/dL — ABNORMAL LOW (ref 70–99)
Glucose-Capillary: 125 mg/dL — ABNORMAL HIGH (ref 70–99)
Glucose-Capillary: 123 mg/dL — ABNORMAL HIGH (ref 70–99)

## 2020-07-22 LAB — IRON AND TIBC
Iron: 26 ug/dL — ABNORMAL LOW (ref 28–170)
Saturation Ratios: 7 % — ABNORMAL LOW (ref 10.4–31.8)
TIBC: 398 ug/dL (ref 250–450)
UIBC: 372 ug/dL

## 2020-07-22 LAB — VITAMIN B12: Vitamin B-12: 273 pg/mL (ref 180–914)

## 2020-07-22 LAB — FOLATE: Folate: 7.2 ng/mL (ref >5.9)

## 2020-07-22 MED ORDER — LANTUS SOLOSTAR 100 UNIT/ML ~~LOC~~ SOPN
40.0000 [IU] | PEN_INJECTOR | Freq: Two times a day (BID) | SUBCUTANEOUS | 0 refills | Status: DC
Start: 2020-07-22 — End: 2021-06-30

## 2020-07-22 MED ORDER — IRON 18 MG PO TBCR: 1.0000 | EXTENDED_RELEASE_TABLET | Freq: Every day | ORAL | 0 refills | Status: DC

## 2020-07-22 MED ORDER — GUAIFENESIN-DM 100-10 MG/5ML PO SYRP: 10.0000 mL | ORAL_SOLUTION | ORAL | 0 refills | Status: DC | PRN

## 2020-07-22 MED ORDER — INSULIN GLARGINE 100 UNIT/ML ~~LOC~~ SOLN
40.0000 [IU] | Freq: Two times a day (BID) | SUBCUTANEOUS | Status: DC
  Administered 2020-07-22: 40 [IU] via SUBCUTANEOUS
  Filled 2020-07-22 (×2): qty 0.4

## 2020-07-22 MED ORDER — HYDROCOD POLST-CPM POLST ER 10-8 MG/5ML PO SUER: 5.0000 mL | Freq: Two times a day (BID) | ORAL | 0 refills | Status: AC | PRN

## 2020-07-22 MED ORDER — AMLODIPINE BESYLATE 10 MG PO TABS: 10.0000 mg | ORAL_TABLET | Freq: Every day | ORAL | 2 refills | Status: DC

## 2020-07-22 MED ORDER — DOXYCYCLINE MONOHYDRATE 100 MG PO CAPS: ORAL_CAPSULE | ORAL | Status: DC

## 2020-07-22 NOTE — Progress Notes (Addendum)
SATURATION QUALIFICATIONS: (This note is used to comply with regulatory documentation for home oxygen)  Patient Saturations on Room Air at Rest = 90%  Patient Saturations on Room Air while Ambulating = 84%  Patient Saturations on 3 Liters of oxygen while Ambulating =  > or = 91%  Please briefly explain why patient needs home oxygen:  covid pneumonia

## 2020-07-22 NOTE — Progress Notes (Signed)
D/c paperwork reviewed w/ pt. Verbalizes understanding of d/c plan and f/u care. All questions answered. 

## 2020-07-22 NOTE — Discharge Summary (Signed)
Physician Discharge Summary   Lauren Velazquez  female DOB: 08/10/1961  VZD:[id number]  PCP: Hillery Aldo, MD  Admit date: 07/14/2020 Discharge date: 07/22/2020  Admitted From: home Disposition:  home Home Health: Yes CODE STATUS: Full code  Discharge Instructions    Discharge instructions   Complete by: As directed    You have finished your COVID treatment.  You need 2 liters of oxygen with just walking, but overtime you should be able to get off of supplemental oxygen.  Please follow up with your outpatient doctor to monitor your oxygen needs.  Your blood glucose dropped too low on your home dose of Lantus, so I have decreased your Lantus to 40 units twice daily.  Please follow up with your outpatient doctor for further monitoring of your blood glucose.  You are also very anemic and very low on iron.  Please take over-the-counter iron supplement.   Dr. Darlin Priestly [facility] Course:  For full details, please see H&P, progress notes, consult notes and ancillary notes.  Briefly,  Lauren Velazquez is a 59 year old female with history significant for hypertension, hyperlipidemia, morbid obesity who presented for evaluation of shortness of breath.   Multifocal pneumonia due to COVID-19 Suspected superimposed bacterial pneumonia Acute respiratory failure with hypoxia, secondary to above Found to be hypoxic down to the 60s on arrival. Requiring high flow nasal cannula 15L to maintain saturations. Also possibility of concomitant bacterial infection given elevated procalcitonin so started on empiric community-acquired pneumonia coverage.  Pt completed 5 days of Remdesivir and ceftriaxone/azithromycin.  CRP 13.8 on presentation.  Pt received solumedrol f/b prednisone 50 mg daily.  CRP normalized prior to discharge.  Pt was discharged on 2L O2 with ambulation and advised to follow up with PCP.  Covid gastroenteritis Patient with nausea vomiting and diarrhea in the past 2  weeks.  Received Supportive care.  Prior to discharge, pt was tolerating oral intake.  Hyperlipidemia Not currently on statin  Hypertension Continued home lisinopril.  BP remained elevated, so amlodipine was added.  Morbid obesity BMI 47 complicated factor to prognosis and care  Diabetes mellitusinsulin dependent Blood glucose dropped too low on home dose of Lantus (70u BID), so Lantus was reduced to 40 units twice daily.  Pt advised to follow up with outpatient doctor for further monitoring of blood glucose.  Depression and insomnia Continued Effexor and trazodone  Chronic pain on chronic opioids Continued home narcotic regimen  Microcytic anemia 2/2 iron def Hgb has been in 8's.  Anemia workup revealed iron def.  Pt was discharged on oral iron supplement.   Discharge Diagnoses:  Principal Problem:   Pneumonia due to COVID-19 virus Active Problems:   Diabetes mellitus type 2, uncomplicated (HCC)   Hypertension   Acute respiratory failure due to COVID-19 (HCC)   Obesity, Class III, BMI 40-49.9 (morbid obesity) (HCC)   Chronic prescription opiate use   Bacterial pneumonia   Acute hypoxemic respiratory failure due to COVID-19 [facility])    Discharge Instructions:  Allergies as of 07/22/2020      Reactions   Atorvastatin Swelling      Medication List    STOP taking these medications   predniSONE 10 MG tablet Commonly known as: DELTASONE     TAKE these medications   amLODipine 10 MG tablet Commonly known as: NORVASC Take 1 tablet (10 mg total) by mouth daily. Start taking on: July 23, 2020   chlorpheniramine-HYDROcodone 10-8 MG/5ML Suer Commonly known as: TUSSIONEX  Take 5 mLs by mouth every 12 (twelve) hours as needed for up to 7 days for cough.   diclofenac sodium 1 % Gel Commonly known as: VOLTAREN Apply 2 g topically 2 (two) times daily as needed (pain).   doxycycline 100 MG capsule Commonly known as: MONODOX Check with your prescriber to  see if this medication is still necessary. What changed:   how much to take  how to take this  when to take this  additional instructions   gabapentin 300 MG capsule Commonly known as: NEURONTIN Take 600 mg by mouth 2 (two) times daily.   guaiFENesin-dextromethorphan 100-10 MG/5ML syrup Commonly known as: ROBITUSSIN DM Take 10 mLs by mouth every 4 (four) hours as needed for cough.   insulin aspart 100 UNIT/ML injection Commonly known as: novoLOG Inject 0-20 Units into the skin 3 (three) times daily with meals. (based upon blood sugar reading)   Iron 18 MG Tbcr Take 1 tablet (18 mg total) by mouth daily. Can take any kind of over-the-counter iron supplement.   Lantus SoloStar 100 UNIT/ML Solostar Pen Generic drug: insulin glargine Inject 40 Units into the skin 2 (two) times daily. This is decreased from prior dose of 70 units twice daily due to low blood glucose. What changed:   how much to take  additional instructions   lisinopril 40 MG tablet Commonly known as: ZESTRIL Take 40 mg by mouth daily.   ondansetron 8 MG disintegrating tablet Commonly known as: ZOFRAN-ODT Take 8 mg by mouth every 8 (eight) hours as needed.   oxyCODONE-acetaminophen 10-325 MG tablet Commonly known as: PERCOCET Take 1 tablet by mouth every 6 (six) hours as needed for pain.   traZODone 100 MG tablet Commonly known as: DESYREL Take 200 mg by mouth 2 (two) times daily as needed for sleep.   venlafaxine XR 75 MG 24 hr capsule Commonly known as: EFFEXOR-XR Take 75-150 mg by mouth 2 (two) times daily.   Ventolin HFA 108 (90 Base) MCG/ACT inhaler Generic drug: albuterol Inhale 2 puffs into the lungs every 6 (six) hours as needed for wheezing or shortness of breath.            Durable Medical Equipment  (From admission, onward)         Start     Ordered   07/20/20 1006  For home use only DME oxygen  Once       Question Answer Comment  Length of Need 6 Months   Mode or (Route)  Nasal cannula   Liters per Minute 2   Frequency Continuous (stationary and portable oxygen unit needed)   Oxygen conserving device Yes   Oxygen delivery system Gas      07/20/20 1005           Follow-up Information    Hillery Aldo, MD. Schedule an appointment as soon as possible for a visit in 1 week.   Specialty: Family Medicine Contact information: [address] [place] [address] [phone]               Allergies  Allergen Reactions  . Atorvastatin Swelling     The results of significant diagnostics from this hospitalization (including imaging, microbiology, ancillary and laboratory) are listed below for reference.   Consultations:   Procedures/Studies: DG Chest Port 1 View  Result Date: 07/15/2020 CLINICAL DATA:  COVID pneumonia, hypoxia EXAM: PORTABLE CHEST 1 VIEW COMPARISON:  03/05/2020 FINDINGS: Moderate patchy diffuse pulmonary infiltrate has developed within the lungs bilaterally, likely  infectious or inflammatory in nature. Low lung volumes noted, however, pulmonary insufflation is symmetric. No pneumothorax or pleural effusion. Cardiac size within normal limits. No acute bone abnormality. IMPRESSION: Multifocal patchy pulmonary infiltrates diffusely throughout the lungs, likely infectious or inflammatory in the acute setting and compatible with given history of COVID-19 pneumonia. Low lung volumes. Electronically Signed   By: Helyn Numbers MD   On: 07/15/2020 00:42      Labs: BNP (last 3 results) Recent Labs    03/05/20 2058  BNP 124.7*   Basic Metabolic Panel: Recent Labs  Lab 07/16/20 0349 07/16/20 0349 07/17/20 0410 07/18/20 0440 07/19/20 0741 07/20/20 0440 07/22/20 0517  NA 138  --  140 137 142 140  --   K 4.3  --  4.3 4.4 3.4* 3.8  --   CL 99  --  99 97* 99 98  --   CO2 29  --  30 33* 38* 37*  --   GLUCOSE 351*  --  226* 279* 91 178*  --   BUN 17  --  18 18 14 15   --   CREATININE 0.69   < > 0.63 0.53 0.69 0.54  0.56  CALCIUM 8.3*  --  8.1* 8.1* 8.1* 8.0*  --    < > = values in this interval not displayed.   Liver Function Tests: Recent Labs  Lab 07/16/20 0349 07/17/20 0410 07/18/20 0440 07/19/20 0741 07/20/20 0440  AST 16 15 16  39 32  ALT 10 10 13 26  61*  ALKPHOS 71 60 57 50 62  BILITOT 0.5 0.4 0.5 0.5 0.5  PROT 6.2* 6.0* 5.8* 5.3* 5.5*  ALBUMIN 2.5* 2.4* 2.5* 2.4* 2.4*   No results for input(s): LIPASE, AMYLASE in the last 168 hours. No results for input(s): AMMONIA in the last 168 hours. CBC: Recent Labs  Lab 07/16/20 0349 07/17/20 0410 07/18/20 0440 07/19/20 0741 07/20/20 0440  WBC 3.9* 5.1 4.7 7.7 7.0  NEUTROABS 3.0 4.2 3.9 5.8 5.1  HGB 9.2* 9.0* 8.7* 9.2* 8.9*  HCT 33.6* 32.9* 32.7* 34.6* 32.8*  MCV 71.9* 72.6* 73.2* 73.3* 72.1*  PLT 275 245 234 250 283   Cardiac Enzymes: No results for input(s): CKTOTAL, CKMB, CKMBINDEX, TROPONINI in the last 168 hours. BNP: Invalid input(s): POCBNP CBG: Recent Labs  Lab 07/21/20 2042 07/22/20 0815 07/22/20 0823 07/22/20 0854 07/22/20 1143  GLUCAP 313* 47* 44* 125* 123*   D-Dimer No results for input(s): DDIMER in the last 72 hours. Hgb A1c No results for input(s): HGBA1C in the last 72 hours. Lipid Profile No results for input(s): CHOL, HDL, LDLCALC, TRIG, CHOLHDL, LDLDIRECT in the last 72 hours. Thyroid function studies No results for input(s): TSH, T4TOTAL, T3FREE, THYROIDAB in the last 72 hours.  Invalid input(s): FREET3 Anemia work up Recent Labs    07/22/20 0517  FOLATE 7.2  TIBC 398  IRON 26*   Urinalysis    Component Value Date/Time   COLORURINE Yellow 11/19/2014 2137   APPEARANCEUR Cloudy 11/19/2014 2137   LABSPEC 1.026 11/19/2014 2137   PHURINE 5.0 11/19/2014 2137   GLUCOSEU Negative 11/19/2014 2137   HGBUR Negative 11/19/2014 2137   BILIRUBINUR Negative 11/19/2014 2137   KETONESUR Negative 11/19/2014 2137   PROTEINUR 100 mg/dL 2138 01/19/2015   NITRITE Negative 11/19/2014 2137   LEUKOCYTESUR  3+ 11/19/2014 2137   Sepsis Labs Invalid input(s): PROCALCITONIN,  WBC,  LACTICIDVEN Microbiology No results found for this or any previous visit (from the past 240 hour(s)).   Total time  spend on discharging this patient, including the last patient exam, discussing the hospital stay, instructions for ongoing care as it relates to all pertinent caregivers, as well as preparing the medical discharge records, prescriptions, and/or referrals as applicable, is 40 minutes.    Darlin Priestly, MD  [facility] 07/22/2020, 12:15 PM

## 2020-07-22 NOTE — Discharge Instructions (Signed)
10 Things You Can Do to Manage Your COVID-19 Symptoms at Home If you have possible or confirmed COVID-19: 1. Stay home from work and school. And stay away from other public places. If you must go out, avoid using any kind of public transportation, ridesharing, or taxis. 2. Monitor your symptoms carefully. If your symptoms get worse, call your healthcare provider immediately. 3. Get rest and stay hydrated. 4. If you have a medical appointment, call the healthcare provider ahead of time and tell them that you have or may have COVID-19. 5. For medical emergencies, call 911 and notify the dispatch personnel that you have or may have COVID-19. 6. Cover your cough and sneezes with a tissue or use the inside of your elbow. 7. Wash your hands often with soap and water for at least 20 seconds or clean your hands with an alcohol-based hand sanitizer that contains at least 60% alcohol. 8. As much as possible, stay in a specific room and away from other people in your home. Also, you should use a separate bathroom, if available. If you need to be around other people in or outside of the home, wear a mask. 9. Avoid sharing personal items with other people in your household, like dishes, towels, and bedding. 10. Clean all surfaces that are touched often, like counters, tabletops, and doorknobs. Use household cleaning sprays or wipes according to the label instructions. cdc.gov/coronavirus 02/13/2019 This information is not intended to replace advice given to you by your health care provider. Make sure you discuss any questions you have with your health care provider. Document Revised: 07/18/2019 Document Reviewed: 07/18/2019 Elsevier Patient Education  2020 [employer] Inc[employer]  

## 2021-06-19 ENCOUNTER — Encounter: Payer: Self-pay | Admitting: Emergency Medicine

## 2021-06-19 ENCOUNTER — Other Ambulatory Visit: Payer: Self-pay

## 2021-06-19 ENCOUNTER — Emergency Department: Payer: BLUE CROSS/BLUE SHIELD

## 2021-06-19 ENCOUNTER — Inpatient Hospital Stay
Admission: EM | Admit: 2021-06-19 | Discharge: 2021-06-30 | DRG: 870 | Disposition: A | Discharge status: Other Acute Inpatient Hospital | Payer: BLUE CROSS/BLUE SHIELD | Attending: Pulmonary Disease | Admitting: Pulmonary Disease

## 2021-06-19 DIAGNOSIS — F1123 Opioid dependence with withdrawal: Secondary | ICD-10-CM | POA: Diagnosis present

## 2021-06-19 DIAGNOSIS — Z794 Long term (current) use of insulin: Secondary | ICD-10-CM

## 2021-06-19 DIAGNOSIS — A419 Sepsis, unspecified organism: Secondary | ICD-10-CM | POA: Diagnosis not present

## 2021-06-19 DIAGNOSIS — Z79899 Other long term (current) drug therapy: Secondary | ICD-10-CM

## 2021-06-19 DIAGNOSIS — D72829 Elevated white blood cell count, unspecified: Secondary | ICD-10-CM

## 2021-06-19 DIAGNOSIS — E785 Hyperlipidemia, unspecified: Secondary | ICD-10-CM | POA: Diagnosis present

## 2021-06-19 DIAGNOSIS — E119 Type 2 diabetes mellitus without complications: Secondary | ICD-10-CM

## 2021-06-19 DIAGNOSIS — J9601 Acute respiratory failure with hypoxia: Secondary | ICD-10-CM | POA: Diagnosis present

## 2021-06-19 DIAGNOSIS — Z4659 Encounter for fitting and adjustment of other gastrointestinal appliance and device: Secondary | ICD-10-CM

## 2021-06-19 DIAGNOSIS — Z888 Allergy status to other drugs, medicaments and biological substances status: Secondary | ICD-10-CM

## 2021-06-19 DIAGNOSIS — Z452 Encounter for adjustment and management of vascular access device: Secondary | ICD-10-CM

## 2021-06-19 DIAGNOSIS — R509 Fever, unspecified: Secondary | ICD-10-CM

## 2021-06-19 DIAGNOSIS — R462 Strange and inexplicable behavior: Secondary | ICD-10-CM

## 2021-06-19 DIAGNOSIS — R4182 Altered mental status, unspecified: Secondary | ICD-10-CM | POA: Diagnosis not present

## 2021-06-19 DIAGNOSIS — F1120 Opioid dependence, uncomplicated: Secondary | ICD-10-CM | POA: Diagnosis present

## 2021-06-19 DIAGNOSIS — K219 Gastro-esophageal reflux disease without esophagitis: Secondary | ICD-10-CM | POA: Diagnosis present

## 2021-06-19 DIAGNOSIS — E1165 Type 2 diabetes mellitus with hyperglycemia: Secondary | ICD-10-CM | POA: Diagnosis present

## 2021-06-19 DIAGNOSIS — E1140 Type 2 diabetes mellitus with diabetic neuropathy, unspecified: Secondary | ICD-10-CM | POA: Diagnosis present

## 2021-06-19 DIAGNOSIS — G9340 Encephalopathy, unspecified: Secondary | ICD-10-CM

## 2021-06-19 DIAGNOSIS — Z8616 Personal history of COVID-19: Secondary | ICD-10-CM

## 2021-06-19 DIAGNOSIS — F32A Depression, unspecified: Secondary | ICD-10-CM | POA: Diagnosis present

## 2021-06-19 DIAGNOSIS — G8929 Other chronic pain: Secondary | ICD-10-CM | POA: Diagnosis present

## 2021-06-19 DIAGNOSIS — Z0189 Encounter for other specified special examinations: Secondary | ICD-10-CM

## 2021-06-19 DIAGNOSIS — Z781 Physical restraint status: Secondary | ICD-10-CM

## 2021-06-19 DIAGNOSIS — G928 Other toxic encephalopathy: Secondary | ICD-10-CM | POA: Diagnosis present

## 2021-06-19 DIAGNOSIS — F419 Anxiety disorder, unspecified: Secondary | ICD-10-CM | POA: Diagnosis present

## 2021-06-19 DIAGNOSIS — R410 Disorientation, unspecified: Secondary | ICD-10-CM

## 2021-06-19 DIAGNOSIS — G4700 Insomnia, unspecified: Secondary | ICD-10-CM | POA: Diagnosis present

## 2021-06-19 DIAGNOSIS — I10 Essential (primary) hypertension: Secondary | ICD-10-CM | POA: Diagnosis present

## 2021-06-19 DIAGNOSIS — Z01818 Encounter for other preprocedural examination: Secondary | ICD-10-CM

## 2021-06-19 DIAGNOSIS — Z9981 Dependence on supplemental oxygen: Secondary | ICD-10-CM

## 2021-06-19 DIAGNOSIS — Z20822 Contact with and (suspected) exposure to covid-19: Secondary | ICD-10-CM | POA: Diagnosis present

## 2021-06-19 DIAGNOSIS — I160 Hypertensive urgency: Secondary | ICD-10-CM | POA: Diagnosis present

## 2021-06-19 DIAGNOSIS — I6340 Cerebral infarction due to embolism of unspecified cerebral artery: Secondary | ICD-10-CM | POA: Diagnosis present

## 2021-06-19 DIAGNOSIS — J9602 Acute respiratory failure with hypercapnia: Secondary | ICD-10-CM | POA: Diagnosis not present

## 2021-06-19 DIAGNOSIS — J449 Chronic obstructive pulmonary disease, unspecified: Secondary | ICD-10-CM | POA: Diagnosis present

## 2021-06-19 DIAGNOSIS — Z6841 Body Mass Index (BMI) 40.0 and over, adult: Secondary | ICD-10-CM

## 2021-06-19 LAB — CBC WITH DIFFERENTIAL/PLATELET
Abs Immature Granulocytes: 0.06 10*3/uL (ref 0.00–0.07)
Basophils Absolute: 0.1 10*3/uL (ref 0.0–0.1)
Basophils Relative: 0 %
Eosinophils Absolute: 0 10*3/uL (ref 0.0–0.5)
Eosinophils Relative: 0 %
HCT: 39.4 % (ref 36.0–46.0)
Hemoglobin: 12.2 g/dL (ref 12.0–15.0)
Immature Granulocytes: 0 %
Lymphocytes Relative: 8 %
Lymphs Abs: 1.1 10*3/uL (ref 0.7–4.0)
MCH: 24.9 pg — ABNORMAL LOW (ref 26.0–34.0)
MCHC: 31 g/dL (ref 30.0–36.0)
MCV: 80.4 fL (ref 80.0–100.0)
Monocytes Absolute: 0.4 10*3/uL (ref 0.1–1.0)
Monocytes Relative: 3 %
Neutro Abs: 12.9 10*3/uL — ABNORMAL HIGH (ref 1.7–7.7)
Neutrophils Relative %: 89 %
Platelets: 507 10*3/uL — ABNORMAL HIGH (ref 150–400)
RBC: 4.9 MIL/uL (ref 3.87–5.11)
RDW: 14.7 % (ref 11.5–15.5)
WBC: 14.4 10*3/uL — ABNORMAL HIGH (ref 4.0–10.5)
nRBC: 0 % (ref 0.0–0.2)

## 2021-06-19 LAB — BASIC METABOLIC PANEL
Anion gap: 9 (ref 5–15)
BUN: 9 mg/dL (ref 6–20)
CO2: 28 mmol/L (ref 22–32)
Calcium: 9.4 mg/dL (ref 8.9–10.3)
Chloride: 97 mmol/L — ABNORMAL LOW (ref 98–111)
Creatinine, Ser: 0.76 mg/dL (ref 0.44–1.00)
GFR, Estimated: >60 mL/min (ref >60)
Glucose, Bld: 386 mg/dL — ABNORMAL HIGH (ref 70–99)
Potassium: 3.9 mmol/L (ref 3.5–5.1)
Sodium: 134 mmol/L — ABNORMAL LOW (ref 135–145)

## 2021-06-19 LAB — TROPONIN I (HIGH SENSITIVITY): Troponin I (High Sensitivity): 8 ng/L (ref <18)

## 2021-06-19 MED ORDER — HALOPERIDOL LACTATE 5 MG/ML IJ SOLN
INTRAMUSCULAR | Status: AC
  Administered 2021-06-19: 5 mg via INTRAMUSCULAR
  Filled 2021-06-19: qty 1

## 2021-06-19 MED ORDER — DIPHENHYDRAMINE HCL 50 MG/ML IJ SOLN: 50.0000 mg | Freq: Once | INTRAMUSCULAR | Status: AC

## 2021-06-19 MED ORDER — DIPHENHYDRAMINE HCL 50 MG/ML IJ SOLN
INTRAMUSCULAR | Status: AC
  Administered 2021-06-19: 50 mg via INTRAMUSCULAR
  Filled 2021-06-19: qty 1

## 2021-06-19 MED ORDER — VANCOMYCIN HCL IN DEXTROSE 1-5 GM/200ML-% IV SOLN: 1000.0000 mg | Freq: Once | INTRAVENOUS | Status: DC

## 2021-06-19 MED ORDER — HALOPERIDOL LACTATE 5 MG/ML IJ SOLN: 5.0000 mg | Freq: Once | INTRAMUSCULAR | Status: AC

## 2021-06-19 MED ORDER — LORAZEPAM 2 MG/ML IJ SOLN
INTRAMUSCULAR | Status: AC
  Administered 2021-06-19: 2 mg
  Filled 2021-06-19: qty 1

## 2021-06-19 MED ORDER — SODIUM CHLORIDE 0.9 % IV BOLUS
1000.0000 mL | Freq: Once | INTRAVENOUS | Status: AC
  Administered 2021-06-19: 1000 mL via INTRAVENOUS

## 2021-06-19 MED ORDER — SODIUM CHLORIDE 0.9 % IV SOLN
2.0000 g | Freq: Once | INTRAVENOUS | Status: AC
  Administered 2021-06-20: 2 g via INTRAVENOUS
  Filled 2021-06-19: qty 2

## 2021-06-19 MED ORDER — LORAZEPAM 2 MG/ML IJ SOLN
INTRAMUSCULAR | Status: AC
  Administered 2021-06-19: 2 mg via INTRAMUSCULAR
  Filled 2021-06-19: qty 1

## 2021-06-19 MED ORDER — LORAZEPAM 2 MG/ML IJ SOLN: 2.0000 mg | Freq: Once | INTRAMUSCULAR | Status: AC

## 2021-06-19 NOTE — ED Notes (Signed)
Pt screaming pushing wheelchair against the chair to try to fall backwards from wheelchairs. Dr Derrill Kay came to see pt in triage pt yelling spouse in triage reports that pt started to behave like this today. Dr. Derrill Kay reports will place some orders and discussed IVC with spouse. Pt is in danger to herself attempting to fall from chair.  This RN administered medications as per MD order 50mg  Benadryl, 5mg  Haldol, 2 mg Lorazepam IM

## 2021-06-19 NOTE — ED Notes (Signed)
Pt. Transferred from Triage to room 22.  She dressing out and screening for contraband. Patient is disoriented. She is warm and dry in no acute distress. UTA SI, HI, and AVH. Pt. was uncooperative with the Furniture conservator/restorer. She needed multiple redirection to calm her down.She laid down in the bed. She is closely monitored by staff.

## 2021-06-19 NOTE — ED Notes (Addendum)
Pt being manually held in recliner by security and myself.  Pt is kicking at her husband and staff, swinging her arms and screaming constantly.  Pt  stating she will take all her pills and overdose and asking her husband to give them all to her so she can overdose.  Also stating she will get "revenge on this hospital by driving her car thru the doors".  Pt taken to room 22, in recliner, restrained by staff due to her jumping out of the chair and running.

## 2021-06-19 NOTE — ED Provider Notes (Signed)
[facility] Emergency Department Provider Note   ____________________________________________   I have reviewed the triage vital signs and the nursing notes.   HISTORY  Chief Complaint multiple medical complaints (? Psych)   History limited by:  Poor historian   HPI Lauren Velazquez is a 60 y.o. female who presents to the emergency department today with multiple medical complaints.  Patient does exhibit some bizarre behavior.  Primary complaint from the patient is for chest pain.  Patient states that she is worried she might of had a heart attack.  She states she has a history of heart disease.  She then also states she feels very dehydrated and would like something to drink. The patient also states that she is having her chronic pain and needs more pain medication.  The husband denies any new medications or drug use.   Records reviewed. Per medical record review patient has a history of anxiety, DM, HTN  Past Medical History:  Diagnosis Date   Anemia    not currently under treatment   Anxiety    Asthma    during allergy season   Diabetes mellitus without complication (HCC)    Hypertension    Vertigo     Patient Active Problem List   Diagnosis Date Noted   Pneumonia due to COVID-19 virus 07/15/2020   Acute respiratory failure due to COVID-19 (HCC) 07/15/2020   Obesity, Class III, BMI 40-49.9 (morbid obesity) (HCC) 07/15/2020   Chronic prescription opiate use 07/15/2020   Bacterial pneumonia 07/15/2020   Acute hypoxemic respiratory failure due to COVID-19 (HCC) 07/15/2020   Suspected COVID-19 virus infection 03/06/2020   Elevated troponin 03/06/2020   Acute respiratory failure with hypoxia (HCC) 03/05/2020   Diabetes mellitus type 2, uncomplicated (HCC) 05/30/2014   Hyperlipidemia 05/30/2014   Hypertension 05/30/2014   Obesity 05/30/2014   Vertigo 05/30/2014   Carpal tunnel syndrome on both sides 12/27/2013    Past Surgical History:   Procedure Laterality Date   CARPAL TUNNEL RELEASE Right 2015   KNEE ARTHROSCOPY Left 05/17/2018   Procedure: ARTHROSCOPY KNEE WITH LATERAL RELEASE, PARTIAL SYNOVECTOMY;  Surgeon: Kennedy Bucker, MD;  Location: [facility] OR[facility];  Service: Orthopedics;  Laterality: Left;   KNEE ARTHROSCOPY WITH LATERAL RELEASE Right 12/28/2017   Procedure: KNEE ARTHROSCOPY WITH LATERAL RELEASE;  Surgeon: Kennedy Bucker, MD;  Location: [facility] OR[facility];  Service: Orthopedics;  Laterality: Right;   TIBIAL TUBERCLERPLASTY Bilateral 06/01/2017   Procedure: TIBIAL TUBERCLE SPUR EXCISION;  Surgeon: Kennedy Bucker, MD;  Location: [facility] OR[facility];  Service: Orthopedics;  Laterality: Bilateral;    Prior to Admission medications   Medication Sig Start Date End Date Taking? Authorizing Provider  amLODipine (NORVASC) 10 MG tablet Take 1 tablet (10 mg total) by mouth daily. 07/23/20 10/21/20  Darlin Priestly, MD  diclofenac sodium (VOLTAREN) 1 % GEL Apply 2 g topically 2 (two) times daily as needed (pain).     [provider]  doxycycline (MONODOX) 100 MG capsule Check with your prescriber to see if this medication is still necessary. 07/22/20   Darlin Priestly, MD  Ferrous Fumarate (IRON) 18 MG TBCR Take 1 tablet (18 mg total) by mouth daily. Can take any kind of over-the-counter iron supplement. 07/22/20   Darlin Priestly, MD  gabapentin (NEURONTIN) 300 MG capsule Take 600 mg by mouth 2 (two) times daily.     [provider]  guaiFENesin-dextromethorphan (ROBITUSSIN DM) 100-10 MG/5ML syrup Take 10 mLs by mouth every 4 (four) hours as needed for cough. 07/22/20   Fran Lowes,  Inetta Fermo, MD  insulin aspart (NOVOLOG) 100 UNIT/ML injection Inject 0-20 Units into the skin 3 (three) times daily with meals. (based upon blood sugar reading)    [provider]  LANTUS SOLOSTAR 100 UNIT/ML Solostar Pen Inject 40 Units into the skin 2 (two) times daily. This is decreased from prior dose of 70 units twice daily due to low blood glucose. 07/22/20   Darlin Priestly, MD  lisinopril  (ZESTRIL) 40 MG tablet Take 40 mg by mouth daily.     [provider]  ondansetron (ZOFRAN-ODT) 8 MG disintegrating tablet Take 8 mg by mouth every 8 (eight) hours as needed. 07/06/20   [provider]  oxyCODONE-acetaminophen (PERCOCET) 10-325 MG tablet Take 1 tablet by mouth every 6 (six) hours as needed for pain.  04/12/17   [provider]  traZODone (DESYREL) 100 MG tablet Take 200 mg by mouth 2 (two) times daily as needed for sleep.     [provider]  venlafaxine XR (EFFEXOR-XR) 75 MG 24 hr capsule Take 75-150 mg by mouth 2 (two) times daily.     [provider]  VENTOLIN HFA 108 (90 Base) MCG/ACT inhaler Inhale 2 puffs into the lungs every 6 (six) hours as needed for wheezing or shortness of breath.     [provider]    Allergies Atorvastatin  No family history on file.  Social History Social History   Tobacco Use   Smoking status: Never   Smokeless tobacco: Never  Vaping Use   Vaping Use: Never used  Substance Use Topics   Alcohol use: No   Drug use: No    Review of Systems Constitutional: No fever/chills Eyes: No visual changes. ENT: Positive for dry throat.  Cardiovascular: Positive for chest pain. Respiratory: Denies shortness of breath. Gastrointestinal: No abdominal pain.  No nausea, no vomiting.  No diarrhea.   Genitourinary: Negative for dysuria. Musculoskeletal: Positive for chronic pain. Skin: Negative for rash. Neurological: Negative for headaches, focal weakness or numbness.  ____________________________________________   PHYSICAL EXAM:  VITAL SIGNS: ED Triage Vitals [06/19/21 1716]  Enc Vitals Group     BP (!) 191/102     Pulse Rate (!) 104     Resp 20     Temp 98 F (36.7 C)     Temp Source Oral     SpO2 96 %     Weight 231 lb (104.8 kg)     Height 5\' 2"  (1.575 m)   Constitutional: Awake and alert. Oriented. Eyes: Conjunctivae are normal.  ENT      Head: Normocephalic and  atraumatic.      Nose: No congestion/rhinnorhea.      Mouth/Throat: Mucous membranes are moist.      Neck: No stridor. Hematological/Lymphatic/Immunilogical: No cervical lymphadenopathy. Cardiovascular: Slight tachycardia. Respiratory: Normal respiratory effort without tachypnea nor retractions.  Gastrointestinal: Soft Genitourinary: Deferred Musculoskeletal: Normal range of motion in all extremities. No lower extremity edema. Neurologic:  Moving all extremities.  Skin:  Skin is warm, dry and intact. No rash noted. Psychiatric: Bizarre mood. Denies SI/HI  ____________________________________________    LABS (pertinent positives/negatives)  BMP na 134, k 3.9, glu 286, cr 0.76 CBC wbc 14.4, hgb 12.2, plt 507 Trop hs 8 UA clear, hgb dipstick small, ketones 80, protein 30, rbc and wbc 0-5 ____________________________________________   EKG  I, , attending physician, personally viewed and interpreted this EKG  EKG Time: 2323 Rate: 121 Rhythm: sinus tachycardia Axis: right axis deviation Intervals: qtc 459 QRS: narrow  ST changes: no st elevation Impression: abnormal ekg ____________________________________________    RADIOLOGY  CT head No acute abnormality  CXR Low lung volumes ____________________________________________   PROCEDURES  Procedures  ____________________________________________   INITIAL IMPRESSION / ASSESSMENT AND PLAN / ED COURSE  Pertinent labs & imaging results that were available during my care of the patient were reviewed by me and considered in my medical decision making (see chart for details).   Patient presented to the emergency department today with primary complaint of chest pain.  However on exam she was exhibiting some bizarre behavior.  Initially did not think patient required IVC given lack of SI and that she appeared calm after my initial interaction.  However the patient became more agitated.  Because of this I did  place patient under IVC.  Initial blood work without any troponin elevation.  She had a mild leukocytosis.  While here in the emergency department however she did develop a fever.  Because of this sepsis work-up was initiated.  Lactic acid was elevated.  Patient was given broad-spectrum antibiotics.  At this time I think altered mental status and bizarre behavior more likely secondary to infection than psychiatric illness.  ____________________________________________   FINAL CLINICAL IMPRESSION(S) / ED DIAGNOSES  Final diagnoses:  Bizarre behavior  Fever, unspecified fever cause  Leukocytosis, unspecified type     Note: This dictation was prepared with Dragon dictation. Any transcriptional errors that result from this process are unintentional     Phineas Semen, MD 06/20/21 [id number]

## 2021-06-19 NOTE — ED Triage Notes (Signed)
Pt to ED via [facility] from home. EMS reports that they were called out for multiple medical problems including stroke, chest pain, shortness of breath, and pain all over. Upon arrival to the ED pt is screaming and yelling on the stretcher, sticking her finger down her throat in an attempt to make herself vomit. Pt is yelling that she is in pain and she needs to get off of the stretcher, however, she refuses to get off the ems stretcher. Pt was brought to triage room 3 where she continued to yell that she was in pain and needed to get up off the stretcher but still refused to do so. Pt was assisted to the recliner by ems and charge nurse. Pt yelling that she needs to take her pain medication because she is a pain clinic patient. Pt then attempted to flip the recliner over. Pt walked out of the triage room and attempted to enter another triage room. Pt continues to yell at staff that she needs her pain medication and started yelling at ED tech and security officer to "stop putting your dick on me". Pt was eventually taken back to triage 3 and EDT was able to obtain vital signs and blood work, EDP Goodman came to triage to see pt and orders were placed.

## 2021-06-19 NOTE — ED Notes (Signed)
Hourly rounding reveals patient in room. No complaints, stable, in no acute distress. Q15 minute rounds and monitoring via Security Cameras to continue. 

## 2021-06-19 NOTE — BH Assessment (Signed)
Patient currently unable to participate in assessment at this time due to medications and patient being disoriented.  Attempted to obtain Collateral information obtained from patient's husband Dezarae Mcclaran [phone] but no answer, a HIPAA compliant voicemail was left to return phone call

## 2021-06-20 ENCOUNTER — Inpatient Hospital Stay: Payer: BLUE CROSS/BLUE SHIELD

## 2021-06-20 ENCOUNTER — Encounter: Payer: Self-pay | Admitting: Internal Medicine

## 2021-06-20 DIAGNOSIS — G9340 Encephalopathy, unspecified: Secondary | ICD-10-CM

## 2021-06-20 DIAGNOSIS — F419 Anxiety disorder, unspecified: Secondary | ICD-10-CM | POA: Diagnosis present

## 2021-06-20 DIAGNOSIS — G039 Meningitis, unspecified: Secondary | ICD-10-CM | POA: Diagnosis not present

## 2021-06-20 DIAGNOSIS — I6340 Cerebral infarction due to embolism of unspecified cerebral artery: Secondary | ICD-10-CM | POA: Diagnosis present

## 2021-06-20 DIAGNOSIS — I82402 Acute embolism and thrombosis of unspecified deep veins of left lower extremity: Secondary | ICD-10-CM | POA: Diagnosis not present

## 2021-06-20 DIAGNOSIS — G0490 Encephalitis and encephalomyelitis, unspecified: Secondary | ICD-10-CM | POA: Diagnosis not present

## 2021-06-20 DIAGNOSIS — E782 Mixed hyperlipidemia: Secondary | ICD-10-CM

## 2021-06-20 DIAGNOSIS — E119 Type 2 diabetes mellitus without complications: Secondary | ICD-10-CM

## 2021-06-20 DIAGNOSIS — Z20822 Contact with and (suspected) exposure to covid-19: Secondary | ICD-10-CM | POA: Diagnosis present

## 2021-06-20 DIAGNOSIS — D72829 Elevated white blood cell count, unspecified: Secondary | ICD-10-CM | POA: Diagnosis not present

## 2021-06-20 DIAGNOSIS — F1120 Opioid dependence, uncomplicated: Secondary | ICD-10-CM | POA: Diagnosis present

## 2021-06-20 DIAGNOSIS — I6310 Cerebral infarction due to embolism of unspecified precerebral artery: Secondary | ICD-10-CM | POA: Diagnosis not present

## 2021-06-20 DIAGNOSIS — Z8616 Personal history of COVID-19: Secondary | ICD-10-CM | POA: Diagnosis not present

## 2021-06-20 DIAGNOSIS — J9602 Acute respiratory failure with hypercapnia: Secondary | ICD-10-CM | POA: Diagnosis not present

## 2021-06-20 DIAGNOSIS — J9601 Acute respiratory failure with hypoxia: Secondary | ICD-10-CM | POA: Diagnosis present

## 2021-06-20 DIAGNOSIS — I10 Essential (primary) hypertension: Secondary | ICD-10-CM | POA: Diagnosis present

## 2021-06-20 DIAGNOSIS — F32A Depression, unspecified: Secondary | ICD-10-CM | POA: Diagnosis present

## 2021-06-20 DIAGNOSIS — E1165 Type 2 diabetes mellitus with hyperglycemia: Secondary | ICD-10-CM | POA: Diagnosis present

## 2021-06-20 DIAGNOSIS — R410 Disorientation, unspecified: Secondary | ICD-10-CM | POA: Diagnosis not present

## 2021-06-20 DIAGNOSIS — R569 Unspecified convulsions: Secondary | ICD-10-CM | POA: Diagnosis not present

## 2021-06-20 DIAGNOSIS — I63413 Cerebral infarction due to embolism of bilateral middle cerebral arteries: Secondary | ICD-10-CM | POA: Diagnosis not present

## 2021-06-20 DIAGNOSIS — G928 Other toxic encephalopathy: Secondary | ICD-10-CM | POA: Diagnosis present

## 2021-06-20 DIAGNOSIS — I160 Hypertensive urgency: Secondary | ICD-10-CM | POA: Diagnosis present

## 2021-06-20 DIAGNOSIS — F1123 Opioid dependence with withdrawal: Secondary | ICD-10-CM | POA: Diagnosis present

## 2021-06-20 DIAGNOSIS — A419 Sepsis, unspecified organism: Secondary | ICD-10-CM

## 2021-06-20 DIAGNOSIS — R509 Fever, unspecified: Secondary | ICD-10-CM | POA: Diagnosis not present

## 2021-06-20 DIAGNOSIS — R4182 Altered mental status, unspecified: Secondary | ICD-10-CM | POA: Diagnosis present

## 2021-06-20 DIAGNOSIS — I6300 Cerebral infarction due to thrombosis of unspecified precerebral artery: Secondary | ICD-10-CM | POA: Diagnosis not present

## 2021-06-20 DIAGNOSIS — G8929 Other chronic pain: Secondary | ICD-10-CM | POA: Diagnosis present

## 2021-06-20 DIAGNOSIS — E1140 Type 2 diabetes mellitus with diabetic neuropathy, unspecified: Secondary | ICD-10-CM | POA: Diagnosis present

## 2021-06-20 DIAGNOSIS — Z781 Physical restraint status: Secondary | ICD-10-CM | POA: Diagnosis not present

## 2021-06-20 DIAGNOSIS — Z6841 Body Mass Index (BMI) 40.0 and over, adult: Secondary | ICD-10-CM | POA: Diagnosis not present

## 2021-06-20 DIAGNOSIS — I428 Other cardiomyopathies: Secondary | ICD-10-CM | POA: Diagnosis not present

## 2021-06-20 DIAGNOSIS — J449 Chronic obstructive pulmonary disease, unspecified: Secondary | ICD-10-CM | POA: Diagnosis present

## 2021-06-20 DIAGNOSIS — Z888 Allergy status to other drugs, medicaments and biological substances status: Secondary | ICD-10-CM | POA: Diagnosis not present

## 2021-06-20 DIAGNOSIS — Z794 Long term (current) use of insulin: Secondary | ICD-10-CM | POA: Diagnosis not present

## 2021-06-20 DIAGNOSIS — R400 Somnolence: Secondary | ICD-10-CM

## 2021-06-20 DIAGNOSIS — Z79899 Other long term (current) drug therapy: Secondary | ICD-10-CM | POA: Diagnosis not present

## 2021-06-20 DIAGNOSIS — R0689 Other abnormalities of breathing: Secondary | ICD-10-CM | POA: Diagnosis not present

## 2021-06-20 DIAGNOSIS — E785 Hyperlipidemia, unspecified: Secondary | ICD-10-CM | POA: Diagnosis present

## 2021-06-20 DIAGNOSIS — I6389 Other cerebral infarction: Secondary | ICD-10-CM | POA: Diagnosis not present

## 2021-06-20 DIAGNOSIS — I340 Nonrheumatic mitral (valve) insufficiency: Secondary | ICD-10-CM | POA: Diagnosis not present

## 2021-06-20 LAB — HEPATIC FUNCTION PANEL
ALT: 13 U/L (ref 0–44)
AST: 24 U/L (ref 15–41)
Albumin: 3.6 g/dL (ref 3.5–5.0)
Alkaline Phosphatase: 90 U/L (ref 38–126)
Bilirubin, Direct: <0.1 mg/dL (ref 0.0–0.2)
Total Bilirubin: 0.7 mg/dL (ref 0.3–1.2)
Total Protein: 7.3 g/dL (ref 6.5–8.1)

## 2021-06-20 LAB — LACTIC ACID, PLASMA
Lactic Acid, Venous: 1.4 mmol/L (ref 0.5–1.9)
Lactic Acid, Venous: 3.4 mmol/L (ref 0.5–1.9)
Lactic Acid, Venous: 3.9 mmol/L (ref 0.5–1.9)
Lactic Acid, Venous: 4.5 mmol/L (ref 0.5–1.9)

## 2021-06-20 LAB — URINE DRUG SCREEN, QUALITATIVE (ARMC ONLY)
Amphetamines, Ur Screen: NOT DETECTED
Barbiturates, Ur Screen: NOT DETECTED
Benzodiazepine, Ur Scrn: NOT DETECTED
Cannabinoid 50 Ng, Ur ~~LOC~~: NOT DETECTED
Cocaine Metabolite,Ur ~~LOC~~: NOT DETECTED
MDMA (Ecstasy)Ur Screen: NOT DETECTED
Methadone Scn, Ur: NOT DETECTED
Opiate, Ur Screen: NOT DETECTED
Phencyclidine (PCP) Ur S: NOT DETECTED
Tricyclic, Ur Screen: NOT DETECTED

## 2021-06-20 LAB — CSF CELL COUNT WITH DIFFERENTIAL
Lymphs, CSF: 20 %
Lymphs, CSF: 23 %
Monocyte-Macrophage-Spinal Fluid: 68 %
Monocyte-Macrophage-Spinal Fluid: 70 %
RBC Count, CSF: 219 /mm3 — ABNORMAL HIGH (ref 0–3)
RBC Count, CSF: 317 /mm3 — ABNORMAL HIGH (ref 0–3)
Segmented Neutrophils-CSF: 10 %
Segmented Neutrophils-CSF: 9 %
Tube #: 1
Tube #: 4
WBC, CSF: 22 /mm3 (ref 0–5)
WBC, CSF: 8 /mm3 — ABNORMAL HIGH (ref 0–5)

## 2021-06-20 LAB — CBC
HCT: 34 % — ABNORMAL LOW (ref 36.0–46.0)
Hemoglobin: 10.4 g/dL — ABNORMAL LOW (ref 12.0–15.0)
MCH: 25 pg — ABNORMAL LOW (ref 26.0–34.0)
MCHC: 30.6 g/dL (ref 30.0–36.0)
MCV: 81.7 fL (ref 80.0–100.0)
Platelets: 419 10*3/uL — ABNORMAL HIGH (ref 150–400)
RBC: 4.16 MIL/uL (ref 3.87–5.11)
RDW: 15 % (ref 11.5–15.5)
WBC: 13.9 10*3/uL — ABNORMAL HIGH (ref 4.0–10.5)
nRBC: 0 % (ref 0.0–0.2)

## 2021-06-20 LAB — BLOOD GAS, ARTERIAL
Acid-base deficit: 0.5 mmol/L (ref 0.0–2.0)
Bicarbonate: 22.8 mmol/L (ref 20.0–28.0)
FIO2: 0.6
MECHVT: 450 mL
O2 Saturation: 99.6 %
PEEP: 5 cmH2O
Patient temperature: 37
RATE: 18 resp/min
pCO2 arterial: 32 mmHg (ref 32.0–48.0)
pH, Arterial: 7.46 — ABNORMAL HIGH (ref 7.350–7.450)
pO2, Arterial: 177 mmHg — ABNORMAL HIGH (ref 83.0–108.0)

## 2021-06-20 LAB — BASIC METABOLIC PANEL
Anion gap: 9 (ref 5–15)
BUN: 7 mg/dL (ref 6–20)
CO2: 25 mmol/L (ref 22–32)
Calcium: 8.5 mg/dL — ABNORMAL LOW (ref 8.9–10.3)
Chloride: 102 mmol/L (ref 98–111)
Creatinine, Ser: 0.69 mg/dL (ref 0.44–1.00)
GFR, Estimated: >60 mL/min (ref >60)
Glucose, Bld: 269 mg/dL — ABNORMAL HIGH (ref 70–99)
Potassium: 4.2 mmol/L (ref 3.5–5.1)
Sodium: 136 mmol/L (ref 135–145)

## 2021-06-20 LAB — URINALYSIS, COMPLETE (UACMP) WITH MICROSCOPIC
Bacteria, UA: NONE SEEN
Bilirubin Urine: NEGATIVE
Glucose, UA: >=500 mg/dL — AB
Ketones, ur: 80 mg/dL — AB
Leukocytes,Ua: NEGATIVE
Nitrite: NEGATIVE
Protein, ur: 30 mg/dL — AB
Specific Gravity, Urine: 1.024 (ref 1.005–1.030)
pH: 6 (ref 5.0–8.0)

## 2021-06-20 LAB — T4, FREE: Free T4: 0.9 ng/dL (ref 0.61–1.12)

## 2021-06-20 LAB — GLUCOSE, CAPILLARY
Glucose-Capillary: 187 mg/dL — ABNORMAL HIGH (ref 70–99)
Glucose-Capillary: 222 mg/dL — ABNORMAL HIGH (ref 70–99)
Glucose-Capillary: 224 mg/dL — ABNORMAL HIGH (ref 70–99)
Glucose-Capillary: 237 mg/dL — ABNORMAL HIGH (ref 70–99)
Glucose-Capillary: 285 mg/dL — ABNORMAL HIGH (ref 70–99)

## 2021-06-20 LAB — PHOSPHORUS
Phosphorus: 1.2 mg/dL — ABNORMAL LOW (ref 2.5–4.6)
Phosphorus: 3.3 mg/dL (ref 2.5–4.6)

## 2021-06-20 LAB — VITAMIN B12: Vitamin B-12: 202 pg/mL (ref 180–914)

## 2021-06-20 LAB — RESP PANEL BY RT-PCR (FLU A&B, COVID) ARPGX2
Influenza A by PCR: NEGATIVE
Influenza B by PCR: NEGATIVE
SARS Coronavirus 2 by RT PCR: NEGATIVE

## 2021-06-20 LAB — PROTIME-INR
INR: 1.1 (ref 0.8–1.2)
Prothrombin Time: 14.4 seconds (ref 11.4–15.2)

## 2021-06-20 LAB — PROCALCITONIN: Procalcitonin: <0.1 ng/mL

## 2021-06-20 LAB — HEMOGLOBIN A1C
Hgb A1c MFr Bld: 8.2 % — ABNORMAL HIGH (ref 4.8–5.6)
Mean Plasma Glucose: 188.64 mg/dL

## 2021-06-20 LAB — PROTEIN AND GLUCOSE, CSF
Glucose, CSF: 204 mg/dL — ABNORMAL HIGH (ref 40–70)
Total  Protein, CSF: 45 mg/dL (ref 15–45)

## 2021-06-20 LAB — MRSA NEXT GEN BY PCR, NASAL: MRSA by PCR Next Gen: NOT DETECTED

## 2021-06-20 LAB — AMMONIA: Ammonia: 35 umol/L (ref 9–35)

## 2021-06-20 LAB — HIV ANTIBODY (ROUTINE TESTING W REFLEX): HIV Screen 4th Generation wRfx: NONREACTIVE

## 2021-06-20 LAB — CBG MONITORING, ED: Glucose-Capillary: 325 mg/dL — ABNORMAL HIGH (ref 70–99)

## 2021-06-20 LAB — TSH: TSH: 0.221 u[IU]/mL — ABNORMAL LOW (ref 0.350–4.500)

## 2021-06-20 LAB — MAGNESIUM
Magnesium: 2 mg/dL (ref 1.7–2.4)
Magnesium: 1.7 mg/dL (ref 1.7–2.4)

## 2021-06-20 LAB — TROPONIN I (HIGH SENSITIVITY): Troponin I (High Sensitivity): 15 ng/L (ref <18)

## 2021-06-20 LAB — CK
Total CK: 393 U/L — ABNORMAL HIGH (ref 38–234)
Total CK: 473 U/L — ABNORMAL HIGH (ref 38–234)

## 2021-06-20 LAB — CRYPTOCOCCAL ANTIGEN, CSF: Crypto Ag: NEGATIVE

## 2021-06-20 MED ORDER — SODIUM CHLORIDE 0.9 % IV SOLN
2.0000 g | INTRAVENOUS | Status: DC
  Administered 2021-06-20 – 2021-06-27 (×46): 2 g via INTRAVENOUS
  Filled 2021-06-20 (×2): qty 2000
  Filled 2021-06-20: qty 2
  Filled 2021-06-20: qty 2000
  Filled 2021-06-20: qty 2
  Filled 2021-06-20 (×3): qty 2000
  Filled 2021-06-20 (×2): qty 2
  Filled 2021-06-20: qty 2000
  Filled 2021-06-20 (×2): qty 2
  Filled 2021-06-20 (×2): qty 2000
  Filled 2021-06-20: qty 2
  Filled 2021-06-20: qty 2000
  Filled 2021-06-20 (×3): qty 2
  Filled 2021-06-20 (×2): qty 2000
  Filled 2021-06-20: qty 2
  Filled 2021-06-20 (×2): qty 2000
  Filled 2021-06-20: qty 2
  Filled 2021-06-20: qty 2000
  Filled 2021-06-20: qty 2
  Filled 2021-06-20 (×3): qty 2000
  Filled 2021-06-20 (×4): qty 2
  Filled 2021-06-20: qty 2000
  Filled 2021-06-20: qty 2
  Filled 2021-06-20: qty 2000
  Filled 2021-06-20: qty 2
  Filled 2021-06-20: qty 2000
  Filled 2021-06-20: qty 2
  Filled 2021-06-20 (×4): qty 2000
  Filled 2021-06-20: qty 2
  Filled 2021-06-20 (×2): qty 2000
  Filled 2021-06-20: qty 2
  Filled 2021-06-20 (×8): qty 2000

## 2021-06-20 MED ORDER — DOCUSATE SODIUM 50 MG/5ML PO LIQD
100.0000 mg | Freq: Two times a day (BID) | ORAL | Status: DC
  Administered 2021-06-20 – 2021-06-25 (×12): 100 mg
  Filled 2021-06-20 (×13): qty 10

## 2021-06-20 MED ORDER — DEXTROSE 5 % IV SOLN
10.0000 mg/kg | Freq: Three times a day (TID) | INTRAVENOUS | Status: DC
  Administered 2021-06-20 – 2021-06-24 (×13): 720 mg via INTRAVENOUS
  Filled 2021-06-20 (×9): qty 14.4
  Filled 2021-06-20 (×2): qty 10
  Filled 2021-06-20 (×5): qty 14.4

## 2021-06-20 MED ORDER — INSULIN ASPART 100 UNIT/ML IJ SOLN
0.0000 [IU] | INTRAMUSCULAR | Status: DC
  Administered 2021-06-21: 3 [IU] via SUBCUTANEOUS
  Administered 2021-06-21: 7 [IU] via SUBCUTANEOUS
  Administered 2021-06-21: 4 [IU] via SUBCUTANEOUS
  Administered 2021-06-21 (×3): 7 [IU] via SUBCUTANEOUS
  Administered 2021-06-21: 4 [IU] via SUBCUTANEOUS
  Administered 2021-06-22: 7 [IU] via SUBCUTANEOUS
  Administered 2021-06-22 (×3): 4 [IU] via SUBCUTANEOUS
  Administered 2021-06-23: 3 [IU] via SUBCUTANEOUS
  Administered 2021-06-23 (×3): 4 [IU] via SUBCUTANEOUS
  Administered 2021-06-23: 3 [IU] via SUBCUTANEOUS
  Administered 2021-06-23 – 2021-06-24 (×4): 4 [IU] via SUBCUTANEOUS
  Administered 2021-06-24: 7 [IU] via SUBCUTANEOUS
  Administered 2021-06-24 (×2): 4 [IU] via SUBCUTANEOUS
  Administered 2021-06-25 (×3): 3 [IU] via SUBCUTANEOUS
  Administered 2021-06-25: 4 [IU] via SUBCUTANEOUS
  Administered 2021-06-25: 3 [IU] via SUBCUTANEOUS
  Administered 2021-06-26 (×3): 4 [IU] via SUBCUTANEOUS
  Administered 2021-06-26: 7 [IU] via SUBCUTANEOUS
  Filled 2021-06-20 (×32): qty 1

## 2021-06-20 MED ORDER — FAMOTIDINE IN NACL 20-0.9 MG/50ML-% IV SOLN
20.0000 mg | Freq: Two times a day (BID) | INTRAVENOUS | Status: DC
  Administered 2021-06-20 – 2021-06-27 (×16): 20 mg via INTRAVENOUS
  Filled 2021-06-20 (×17): qty 50

## 2021-06-20 MED ORDER — ACETAMINOPHEN 650 MG RE SUPP
650.0000 mg | Freq: Four times a day (QID) | RECTAL | Status: DC | PRN
  Administered 2021-06-20: 650 mg via RECTAL
  Filled 2021-06-20: qty 2

## 2021-06-20 MED ORDER — ORAL CARE MOUTH RINSE
15.0000 mL | OROMUCOSAL | Status: DC
  Administered 2021-06-20 – 2021-06-27 (×66): 15 mL via OROMUCOSAL

## 2021-06-20 MED ORDER — ACETAMINOPHEN 325 MG PO TABS: 650.0000 mg | ORAL_TABLET | Freq: Four times a day (QID) | ORAL | Status: DC | PRN

## 2021-06-20 MED ORDER — ETOMIDATE 2 MG/ML IV SOLN
30.0000 mg | Freq: Once | INTRAVENOUS | Status: AC
  Administered 2021-06-20: 30 mg via INTRAVENOUS

## 2021-06-20 MED ORDER — INSULIN ASPART 100 UNIT/ML IJ SOLN: 0.0000 [IU] | Freq: Every day | INTRAMUSCULAR | Status: DC

## 2021-06-20 MED ORDER — ONDANSETRON HCL 4 MG/2ML IJ SOLN: 4.0000 mg | Freq: Four times a day (QID) | INTRAMUSCULAR | Status: DC | PRN

## 2021-06-20 MED ORDER — PROPOFOL 1000 MG/100ML IV EMUL
INTRAVENOUS | Status: AC
  Filled 2021-06-20: qty 100

## 2021-06-20 MED ORDER — DEXAMETHASONE SODIUM PHOSPHATE 10 MG/ML IJ SOLN
10.0000 mg | Freq: Once | INTRAMUSCULAR | Status: AC
  Administered 2021-06-20: 10 mg via INTRAVENOUS
  Filled 2021-06-20: qty 1

## 2021-06-20 MED ORDER — FENTANYL CITRATE PF 50 MCG/ML IJ SOSY
100.0000 ug | PREFILLED_SYRINGE | Freq: Once | INTRAMUSCULAR | Status: AC
  Administered 2021-06-20: 100 ug via INTRAVENOUS

## 2021-06-20 MED ORDER — LORAZEPAM 2 MG/ML IJ SOLN
2.0000 mg | Freq: Once | INTRAMUSCULAR | Status: AC
  Administered 2021-06-20: 2 mg via INTRAVENOUS
  Filled 2021-06-20: qty 1

## 2021-06-20 MED ORDER — ACETAMINOPHEN 500 MG PO TABS
1000.0000 mg | ORAL_TABLET | Freq: Four times a day (QID) | ORAL | Status: DC
  Administered 2021-06-20 (×2): 1000 mg
  Filled 2021-06-20 (×2): qty 2

## 2021-06-20 MED ORDER — FENTANYL BOLUS VIA INFUSION
50.0000 ug | INTRAVENOUS | Status: DC | PRN
  Administered 2021-06-21 – 2021-06-25 (×6): 100 ug via INTRAVENOUS
  Filled 2021-06-20: qty 100

## 2021-06-20 MED ORDER — ETOMIDATE 2 MG/ML IV SOLN
INTRAVENOUS | Status: AC
  Filled 2021-06-20: qty 10

## 2021-06-20 MED ORDER — VENLAFAXINE HCL ER 150 MG PO CP24: 150.0000 mg | ORAL_CAPSULE | Freq: Every day | ORAL | Status: DC

## 2021-06-20 MED ORDER — DICLOFENAC SODIUM 1 % EX GEL
2.0000 g | Freq: Two times a day (BID) | CUTANEOUS | Status: DC | PRN
  Filled 2021-06-20: qty 100

## 2021-06-20 MED ORDER — FENTANYL 2500MCG IN NS 250ML (10MCG/ML) PREMIX INFUSION
INTRAVENOUS | Status: AC
  Administered 2021-06-20: 50 ug/h via INTRAVENOUS
  Filled 2021-06-20: qty 250

## 2021-06-20 MED ORDER — SODIUM CHLORIDE 0.9 % IV SOLN: INTRAVENOUS | Status: DC

## 2021-06-20 MED ORDER — LACTATED RINGERS IV BOLUS (SEPSIS)
600.0000 mL | Freq: Once | INTRAVENOUS | Status: AC
  Administered 2021-06-20: 600 mL via INTRAVENOUS

## 2021-06-20 MED ORDER — LORAZEPAM 2 MG/ML IJ SOLN: 2.0000 mg | INTRAMUSCULAR | Status: DC | PRN

## 2021-06-20 MED ORDER — ALBUTEROL SULFATE HFA 108 (90 BASE) MCG/ACT IN AERS: 2.0000 | INHALATION_SPRAY | Freq: Four times a day (QID) | RESPIRATORY_TRACT | Status: DC | PRN

## 2021-06-20 MED ORDER — IBUPROFEN 400 MG PO TABS: 800.0000 mg | ORAL_TABLET | Freq: Four times a day (QID) | ORAL | Status: DC | PRN

## 2021-06-20 MED ORDER — ACETAMINOPHEN 325 MG PO TABS
325.0000 mg | ORAL_TABLET | Freq: Once | ORAL | Status: AC
  Administered 2021-06-20: 325 mg
  Filled 2021-06-20: qty 1

## 2021-06-20 MED ORDER — MIDAZOLAM HCL 2 MG/2ML IJ SOLN
2.0000 mg | INTRAMUSCULAR | Status: DC | PRN
  Administered 2021-06-21 – 2021-06-26 (×15): 2 mg via INTRAVENOUS
  Filled 2021-06-20 (×18): qty 2

## 2021-06-20 MED ORDER — HYDRALAZINE HCL 20 MG/ML IJ SOLN
10.0000 mg | INTRAMUSCULAR | Status: DC | PRN
  Administered 2021-06-23: 10 mg via INTRAVENOUS
  Administered 2021-06-26: 20 mg via INTRAVENOUS
  Administered 2021-06-28: 10 mg via INTRAVENOUS
  Administered 2021-06-29 – 2021-06-30 (×2): 20 mg via INTRAVENOUS
  Filled 2021-06-20 (×6): qty 1

## 2021-06-20 MED ORDER — PROPOFOL 1000 MG/100ML IV EMUL
5.0000 ug/kg/min | INTRAVENOUS | Status: DC
  Administered 2021-06-20 (×2): 70 ug/kg/min via INTRAVENOUS
  Administered 2021-06-20: 40 ug/kg/min via INTRAVENOUS
  Administered 2021-06-20: 70 ug/kg/min via INTRAVENOUS
  Administered 2021-06-20: 50 ug/kg/min via INTRAVENOUS
  Administered 2021-06-20: 20 ug/kg/min via INTRAVENOUS
  Administered 2021-06-20 – 2021-06-21 (×2): 50 ug/kg/min via INTRAVENOUS
  Administered 2021-06-21: 5 ug/kg/min via INTRAVENOUS
  Administered 2021-06-21 – 2021-06-22 (×5): 35 ug/kg/min via INTRAVENOUS
  Administered 2021-06-22: 40 ug/kg/min via INTRAVENOUS
  Administered 2021-06-22 (×2): 35 ug/kg/min via INTRAVENOUS
  Administered 2021-06-23: 50 ug/kg/min via INTRAVENOUS
  Administered 2021-06-23 (×2): 35 ug/kg/min via INTRAVENOUS
  Administered 2021-06-23 – 2021-06-25 (×10): 50 ug/kg/min via INTRAVENOUS
  Administered 2021-06-25: 40 ug/kg/min via INTRAVENOUS
  Administered 2021-06-25: 45 ug/kg/min via INTRAVENOUS
  Filled 2021-06-20 (×6): qty 100
  Filled 2021-06-20: qty 200
  Filled 2021-06-20 (×15): qty 100
  Filled 2021-06-20: qty 200
  Filled 2021-06-20: qty 100
  Filled 2021-06-20: qty 200
  Filled 2021-06-20 (×5): qty 100

## 2021-06-20 MED ORDER — IBUPROFEN 800 MG PO TABS
800.0000 mg | ORAL_TABLET | Freq: Once | ORAL | Status: AC
  Administered 2021-06-20: 800 mg
  Filled 2021-06-20: qty 1

## 2021-06-20 MED ORDER — LACTATED RINGERS IV SOLN: INTRAVENOUS | Status: AC

## 2021-06-20 MED ORDER — IOHEXOL 300 MG/ML  SOLN
100.0000 mL | Freq: Once | INTRAMUSCULAR | Status: AC | PRN
  Administered 2021-06-20: 100 mL via INTRAVENOUS

## 2021-06-20 MED ORDER — INSULIN GLARGINE-YFGN 100 UNIT/ML ~~LOC~~ SOLN
40.0000 [IU] | Freq: Two times a day (BID) | SUBCUTANEOUS | Status: DC
  Administered 2021-06-20 – 2021-06-26 (×14): 40 [IU] via SUBCUTANEOUS
  Filled 2021-06-20 (×18): qty 0.4

## 2021-06-20 MED ORDER — VANCOMYCIN HCL 750 MG/150ML IV SOLN
750.0000 mg | Freq: Two times a day (BID) | INTRAVENOUS | Status: DC
  Administered 2021-06-20 – 2021-06-22 (×5): 750 mg via INTRAVENOUS
  Filled 2021-06-20 (×7): qty 150

## 2021-06-20 MED ORDER — ACETAMINOPHEN 500 MG PO TABS
500.0000 mg | ORAL_TABLET | Freq: Once | ORAL | Status: AC
  Administered 2021-06-20: 500 mg
  Filled 2021-06-20: qty 1

## 2021-06-20 MED ORDER — CHLORHEXIDINE GLUCONATE CLOTH 2 % EX PADS
6.0000 | MEDICATED_PAD | Freq: Every day | CUTANEOUS | Status: DC
  Administered 2021-06-20 – 2021-06-30 (×9): 6 via TOPICAL
  Filled 2021-06-20: qty 6

## 2021-06-20 MED ORDER — CHLORHEXIDINE GLUCONATE 0.12% ORAL RINSE (MEDLINE KIT)
15.0000 mL | Freq: Two times a day (BID) | OROMUCOSAL | Status: DC
  Administered 2021-06-20 – 2021-06-30 (×16): 15 mL via OROMUCOSAL

## 2021-06-20 MED ORDER — POLYETHYLENE GLYCOL 3350 17 G PO PACK
17.0000 g | PACK | Freq: Every day | ORAL | Status: DC
  Administered 2021-06-20 – 2021-06-25 (×6): 17 g
  Filled 2021-06-20 (×6): qty 1

## 2021-06-20 MED ORDER — VANCOMYCIN HCL 10 G IV SOLR
2000.0000 mg | Freq: Once | INTRAVENOUS | Status: AC
  Administered 2021-06-20: 2000 mg via INTRAVENOUS
  Filled 2021-06-20: qty 20

## 2021-06-20 MED ORDER — ALBUTEROL SULFATE (2.5 MG/3ML) 0.083% IN NEBU: 2.5000 mg | INHALATION_SOLUTION | RESPIRATORY_TRACT | Status: DC | PRN

## 2021-06-20 MED ORDER — FENTANYL BOLUS VIA INFUSION
100.0000 ug | Freq: Once | INTRAVENOUS | Status: AC
  Administered 2021-06-20: 100 ug via INTRAVENOUS
  Filled 2021-06-20: qty 100

## 2021-06-20 MED ORDER — BUDESONIDE 0.25 MG/2ML IN SUSP
0.2500 mg | Freq: Two times a day (BID) | RESPIRATORY_TRACT | Status: DC
  Administered 2021-06-20 – 2021-06-30 (×21): 0.25 mg via RESPIRATORY_TRACT
  Filled 2021-06-20 (×22): qty 2

## 2021-06-20 MED ORDER — ENOXAPARIN SODIUM 60 MG/0.6ML IJ SOSY
0.5000 mg/kg | PREFILLED_SYRINGE | INTRAMUSCULAR | Status: DC
  Administered 2021-06-20 – 2021-06-26 (×7): 52.5 mg via SUBCUTANEOUS
  Filled 2021-06-20 (×7): qty 0.53

## 2021-06-20 MED ORDER — ONDANSETRON HCL 4 MG PO TABS: 4.0000 mg | ORAL_TABLET | Freq: Four times a day (QID) | ORAL | Status: DC | PRN

## 2021-06-20 MED ORDER — DOCUSATE SODIUM 100 MG PO CAPS: 100.0000 mg | ORAL_CAPSULE | Freq: Two times a day (BID) | ORAL | Status: DC | PRN

## 2021-06-20 MED ORDER — VENLAFAXINE HCL ER 75 MG PO CP24
75.0000 mg | ORAL_CAPSULE | Freq: Every morning | ORAL | Status: DC
  Filled 2021-06-20: qty 1

## 2021-06-20 MED ORDER — SUCCINYLCHOLINE CHLORIDE 200 MG/10ML IV SOSY
150.0000 mg | PREFILLED_SYRINGE | Freq: Once | INTRAVENOUS | Status: AC
  Administered 2021-06-20: 150 mg via INTRAVENOUS

## 2021-06-20 MED ORDER — LACTATED RINGERS IV BOLUS
1000.0000 mL | Freq: Once | INTRAVENOUS | Status: AC
  Administered 2021-06-20: 1000 mL via INTRAVENOUS

## 2021-06-20 MED ORDER — LACTATED RINGERS IV BOLUS (SEPSIS)
1000.0000 mL | Freq: Once | INTRAVENOUS | Status: AC
  Administered 2021-06-20: 1000 mL via INTRAVENOUS

## 2021-06-20 MED ORDER — SODIUM CHLORIDE 0.9 % IV SOLN: INTRAVENOUS | Status: DC | PRN

## 2021-06-20 MED ORDER — VENLAFAXINE HCL ER 75 MG PO CP24
75.0000 mg | ORAL_CAPSULE | Freq: Two times a day (BID) | ORAL | Status: DC
Start: 2021-06-20 — End: 2021-06-20

## 2021-06-20 MED ORDER — LISINOPRIL 10 MG PO TABS: 40.0000 mg | ORAL_TABLET | Freq: Every day | ORAL | Status: DC

## 2021-06-20 MED ORDER — SODIUM CHLORIDE 0.9 % IV SOLN
2.0000 g | Freq: Two times a day (BID) | INTRAVENOUS | Status: DC
  Administered 2021-06-20 – 2021-06-27 (×15): 2 g via INTRAVENOUS
  Filled 2021-06-20: qty 20
  Filled 2021-06-20: qty 2
  Filled 2021-06-20: qty 20
  Filled 2021-06-20 (×3): qty 2
  Filled 2021-06-20: qty 20
  Filled 2021-06-20: qty 2
  Filled 2021-06-20 (×2): qty 20
  Filled 2021-06-20: qty 2
  Filled 2021-06-20 (×2): qty 20
  Filled 2021-06-20: qty 2
  Filled 2021-06-20 (×5): qty 20

## 2021-06-20 MED ORDER — GABAPENTIN 300 MG PO CAPS: 600.0000 mg | ORAL_CAPSULE | Freq: Two times a day (BID) | ORAL | Status: DC

## 2021-06-20 MED ORDER — HALOPERIDOL LACTATE 5 MG/ML IJ SOLN: 2.0000 mg | Freq: Four times a day (QID) | INTRAMUSCULAR | Status: DC | PRN

## 2021-06-20 MED ORDER — INSULIN ASPART 100 UNIT/ML IJ SOLN: 0.0000 [IU] | Freq: Three times a day (TID) | INTRAMUSCULAR | Status: DC

## 2021-06-20 MED ORDER — ACETAMINOPHEN 650 MG RE SUPP: 650.0000 mg | Freq: Four times a day (QID) | RECTAL | Status: AC

## 2021-06-20 MED ORDER — FENTANYL 2500MCG IN NS 250ML (10MCG/ML) PREMIX INFUSION
0.0000 ug/h | INTRAVENOUS | Status: DC
  Administered 2021-06-20 – 2021-06-22 (×4): 200 ug/h via INTRAVENOUS
  Administered 2021-06-23: 175 ug/h via INTRAVENOUS
  Administered 2021-06-23 – 2021-06-26 (×6): 200 ug/h via INTRAVENOUS
  Filled 2021-06-20 (×12): qty 250

## 2021-06-20 MED ORDER — VANCOMYCIN HCL 2000 MG/400ML IV SOLN
2000.0000 mg | Freq: Once | INTRAVENOUS | Status: DC
  Filled 2021-06-20: qty 400

## 2021-06-20 MED ORDER — GABAPENTIN 300 MG PO CAPS
600.0000 mg | ORAL_CAPSULE | Freq: Two times a day (BID) | ORAL | Status: DC
Start: 2021-06-20 — End: 2021-06-28
  Administered 2021-06-20 – 2021-06-25 (×12): 600 mg
  Filled 2021-06-20 (×13): qty 2

## 2021-06-20 MED ORDER — ARFORMOTEROL TARTRATE 15 MCG/2ML IN NEBU
15.0000 ug | INHALATION_SOLUTION | Freq: Two times a day (BID) | RESPIRATORY_TRACT | Status: DC
  Administered 2021-06-20 – 2021-06-29 (×18): 15 ug via RESPIRATORY_TRACT
  Filled 2021-06-20 (×24): qty 2

## 2021-06-20 MED ORDER — INSULIN ASPART 100 UNIT/ML IJ SOLN
0.0000 [IU] | INTRAMUSCULAR | Status: DC
Start: 2021-06-20 — End: 2021-06-20
  Administered 2021-06-20: 8 [IU] via SUBCUTANEOUS
  Administered 2021-06-20 (×3): 5 [IU] via SUBCUTANEOUS
  Administered 2021-06-20: 15 [IU] via SUBCUTANEOUS
  Filled 2021-06-20 (×5): qty 1

## 2021-06-20 MED ORDER — TRAZODONE HCL 100 MG PO TABS: 200.0000 mg | ORAL_TABLET | Freq: Every evening | ORAL | Status: DC | PRN

## 2021-06-20 MED ORDER — PROPOFOL 10 MG/ML IV BOLUS
INTRAVENOUS | Status: AC
  Filled 2021-06-20: qty 20

## 2021-06-20 MED ORDER — FLUTICASONE-UMECLIDIN-VILANT 100-62.5-25 MCG/ACT IN AEPB: 1.0000 | INHALATION_SPRAY | Freq: Every day | RESPIRATORY_TRACT | Status: DC

## 2021-06-20 MED ORDER — ACETAMINOPHEN 325 MG PO TABS
650.0000 mg | ORAL_TABLET | Freq: Four times a day (QID) | ORAL | Status: AC
  Administered 2021-06-20 – 2021-06-21 (×2): 650 mg
  Filled 2021-06-20 (×2): qty 2

## 2021-06-20 MED ORDER — SUCCINYLCHOLINE CHLORIDE 200 MG/10ML IV SOSY
PREFILLED_SYRINGE | INTRAVENOUS | Status: AC
  Filled 2021-06-20: qty 10

## 2021-06-20 MED ORDER — VANCOMYCIN HCL IN DEXTROSE 1-5 GM/200ML-% IV SOLN: 1000.0000 mg | Freq: Two times a day (BID) | INTRAVENOUS | Status: DC

## 2021-06-20 MED ORDER — POLYETHYLENE GLYCOL 3350 17 G PO PACK: 17.0000 g | PACK | Freq: Every day | ORAL | Status: DC | PRN

## 2021-06-20 MED ORDER — ACETAMINOPHEN 650 MG RE SUPP: 650.0000 mg | Freq: Four times a day (QID) | RECTAL | Status: DC

## 2021-06-20 NOTE — ED Notes (Signed)
Hourly rounding reveals patient in room. No complaints, stable, in no acute distress. Q15 minute rounds and monitoring via Rover and Officer to continue.   

## 2021-06-20 NOTE — ED Notes (Signed)
Lab Research officer, political party and reported critical Lactic of 4.5. EDP Dr. Elesa Massed made aware and Pt's RN Hewan Made aware.

## 2021-06-20 NOTE — Consult Note (Signed)
PHARMACY CONSULT NOTE  Pharmacy Consult for Electrolyte Monitoring and Replacement   Recent Labs: Potassium (mmol/L)  Date Value  06/20/2021 4.2  11/19/2014 3.8   Magnesium (mg/dL)  Date Value  40/03/6760 1.7   Calcium (mg/dL)  Date Value  95/04/3266 8.5 (L)   Calcium, Total (mg/dL)  Date Value  12/45/8099 9.4   Albumin (g/dL)  Date Value  83/38/2505 2.4 (L)  11/19/2014 3.9   Phosphorus (mg/dL)  Date Value  39/76/7341 3.3   Sodium (mmol/L)  Date Value  06/20/2021 136  11/19/2014 139   Assessment: Patient is a 60 y/o F with medical history including diabetes, HTN, vertigo, HLD, anxiety, chronic back pain who presented to the ED 11/5 with altered mentation. Patient subsequently found to have elevated lactic acid and leukocytosis without obvious source. There is concern for meningitis and lumbar puncture was performed and patient started on broad spectrum antimicrobial coverage. Patient ultimately required intubation for airway protection due to worsening agitation. Patient is currently admitted to the ICU. Pharmacy consulted to assist with electrolyte monitoring and replacement as indicated.  Goal of Therapy:  Electrolytes within normal limits  Plan:  --No electrolyte replacement indicated at this time --Follow-up electrolytes with AM labs tomorrow  Tressie Ellis 06/20/2021 2:54 PM

## 2021-06-20 NOTE — H&P (Addendum)
History and Physical   Lauren Velazquez LYY:[id number] DOB: 03-16-1961 DOA: 06/19/2021  PCP: Denton Lank, MD  Patient coming from: Home [facility]  I have personally briefly reviewed patient's old medical records in [place].  Chief Concern: Altered mental status  HPI: Lauren Velazquez is a 60 y.o. female with medical history significant for obesity, insulin-dependent diabetes mellitus, hypertension, chronic pain, depression, anxiety, asthma, who presents emergency department for chief concerns of altered mental status.  HPI unable to be completed and no family were at bedside.  At bedside patient was confused, altered, not able to respond to her name being called, opens eyes spontaneously.  Moves all extremities and state of confusion and agitation.  Not following commands.  Bilateral pupils were equal and reactive to light.  Heart rate was mildly elevated and sinus tachycardia and regular.  Not using respiratory muscles to breathe.  Patient has scratching wounds all over that were present on admission.  Social history: Unknown Vaccination history: Unknown  ROS: Unable to be completed due to altered mental status  ED Course: Discussed with emergency medicine provider, patient requiring hospitalization for chief concerns of altered mental status, concerning for meningitis.  Vitals in the emergency department showed T-max of 101.9, respiration rate of 20, heart rate of 104, blood pressure initially 191/102, improved to 132/92, SPO2 of 96% on room air.  Labs revealed sodium 134, potassium 3.9, chloride 97, bicarb of 28, BUN of 9, serum creatinine of 0.76, nonfasting blood glucose 386, WBC 14.4, hemoglobin 12.2, platelets 507, troponin is 8 and increased to 15.  Lactic acid was elevated at 4.5.  COVID/influenza A/influenza B PCR were negative.  UA was negative for leukocytes and nitrates.  UDS was also negative.  In the emergency department patient was given Benadryl 50 mg IV, Haldol 5 mg  IM, Ativan 2 mg IV, and additional 2 mg of Ativan given.  This is all for agitation.  Sodium chloride 1 L bolus given.  ED provider attempted lumbar puncture.  ED provider ordered vancomycin, cefepime, acyclovir per pharmacy. CSF cell count with differential, culture, HSV 1 and 2, CSF protein and glucose, HIV antibody were ordered.  Assessment/Plan  Active Problems:   Diabetes mellitus type 2, uncomplicated (HCC)   Hyperlipidemia   Hypertension   Obesity, Class III, BMI 40-49.9 (morbid obesity) (HCC)   Altered mental status   Insulin dependent type 2 diabetes mellitus ([place])   # Altered mental status-etiology work-up in progress # Met SIRS criteria-increased respiration rate, fever, leukocytosis, source work-up in progress # Clinical suspicion for meningitis - Check ammonia, B12, B1, vitamin D, TSH, magnesium, phosphorus - Check HIV antibody - Check CSF culture with gram stain, CSF cell count with differentials, CSF VDRL, cryptococcal antigen, HSV 1 and 2 - MRSA by PCR - Blood cultures x2 ordered - Decadron 10 mg IV once - Continue with vancomycin, cefepime, acyclovir per pharmacy for meningitis  # Anxiety and agitation-etiology work-up in progress, suspect meningitis - Haldol 2 mg IM every 6 hours as needed for agitation, 2 doses ordered, lorazepam injection 2 mg IV every 4 hours as needed for anxiety and sedation, 2 doses ordered  # History of insomnia-resume trazodone 200 mg nightly as needed for sleep # Depression/anxiety-venlafaxine resumed per home dosing # Hypertension-lisinopril 40 mg nightly resumed for 06/20/2021  # Insulin-dependent diabetes mellitus-resumed home long-acting glargine 40 units subcutaneous twice daily, insulin SSI with at bedtime coverage ordered  # Peripheral neuropathy-gabapentin 600 mg twice daily resumed  # Asthma-resumed home  inhalers  # DVT prophylaxis-pharmacologic DVT prophylaxis was not initiated by myself as patient is getting a lumbar  puncture, would recommend holding pharmacologic DVT prophylaxis for 48 hours post LP - A.m. team to initiate pharmacologic DVT prophylaxis when appropriate  Aspiration precautions Fall precautions  Chart reviewed.   DVT prophylaxis: TED hose Code Status: Full code Diet: N.p.o. Family Communication: Attempted to call spouse Lauren Velazquez at [phone] and left a voicemail.  No pickup. Disposition Plan: Pending clinical course, CSF culture Consults called: None at this time, patient would need infectious disease consultation should her CSF come back positive Admission status: MedSurg, observation, telemetry  Past Medical History:  Diagnosis Date   Anemia    not currently under treatment   Anxiety    Asthma    during allergy season   Diabetes mellitus without complication ([place])    Hypertension    Vertigo    Past Surgical History:  Procedure Laterality Date   CARPAL TUNNEL RELEASE Right 2015   KNEE ARTHROSCOPY Left 05/17/2018   Procedure: ARTHROSCOPY KNEE WITH LATERAL RELEASE, PARTIAL SYNOVECTOMY;  Surgeon: Hessie Knows, MD;  Location: [facility] ORS;  Service: Orthopedics;  Laterality: Left;   KNEE ARTHROSCOPY WITH LATERAL RELEASE Right 12/28/2017   Procedure: KNEE ARTHROSCOPY WITH LATERAL RELEASE;  Surgeon: Hessie Knows, MD;  Location: [facility] ORS;  Service: Orthopedics;  Laterality: Right;   TIBIAL TUBERCLERPLASTY Bilateral 06/01/2017   Procedure: TIBIAL TUBERCLE SPUR EXCISION;  Surgeon: Hessie Knows, MD;  Location: [facility] ORS;  Service: Orthopedics;  Laterality: Bilateral;   Social History:  reports that she has never smoked. She has never used smokeless tobacco. She reports that she does not drink alcohol and does not use drugs.  Allergies  Allergen Reactions   Atorvastatin Swelling   No family history on file. Family history: Family history reviewed and not pertinent  Prior to Admission medications   Medication Sig Start Date End Date Taking? Authorizing Provider   diclofenac sodium (VOLTAREN) 1 % GEL Apply 2 g topically 2 (two) times daily as needed (pain).    Yes [provider]  ferrous gluconate (FERGON) 324 MG tablet Take 324 mg by mouth 3 (three) times daily. 04/02/21  Yes [provider]  Fluticasone-Umeclidin-Vilant 100-62.5-25 MCG/ACT AEPB Inhale 1 puff into the lungs daily. 05/18/21  Yes [provider]  gabapentin (NEURONTIN) 300 MG capsule Take 600 mg by mouth 2 (two) times daily.    Yes [provider]  insulin aspart (NOVOLOG) 100 UNIT/ML injection Inject 0-20 Units into the skin 3 (three) times daily with meals. (based upon blood sugar reading)   Yes [provider]  LANTUS SOLOSTAR 100 UNIT/ML Solostar Pen Inject 40 Units into the skin 2 (two) times daily. This is decreased from prior dose of 70 units twice daily due to low blood glucose. 07/22/20  Yes Enzo Bi, MD  lisinopril (ZESTRIL) 40 MG tablet Take 40 mg by mouth daily.    Yes [provider]  oxyCODONE-acetaminophen (PERCOCET) 10-325 MG tablet Take 1 tablet by mouth every 6 (six) hours as needed for pain.  04/12/17  Yes [provider]  traZODone (DESYREL) 100 MG tablet Take 200 mg by mouth at bedtime as needed for sleep.   Yes [provider]  venlafaxine XR (EFFEXOR-XR) 75 MG 24 hr capsule Take 75-150 mg by mouth 2 (two) times daily.    Yes [provider]  VENTOLIN HFA 108 (90 Base) MCG/ACT inhaler Inhale 2 puffs into the lungs every 6 (six) hours as  needed for wheezing or shortness of breath.    Yes [provider]   Physical Exam: Vitals:   06/19/21 1716 06/19/21 2206 06/20/21 0100  BP: (!) 191/102 (!) 132/92   Pulse: (!) 104 (!) 107 (!) 134  Resp: _0 Temp: 98 F (36.7 C) (!) 101.9 F (38.8 C) (!) 103.9 F (39.9 C)  TempSrc: Oral Oral Axillary  SpO2: 96% 96% 95%  Weight: 104.8 kg    Height: 5' 2" (1.575 m)     Constitutional: appears older than chronological age, NAD, calm,  comfortable Eyes: PERRL, lids and conjunctivae normal ENMT: Mucous membranes are moist. Posterior pharynx clear of any exudate or lesions. Age-appropriate dentition.  Unable to assess hearing Neck: normal, supple, no masses, no thyromegaly Respiratory: clear to auscultation bilaterally, no wheezing, no crackles. Normal respiratory effort. No accessory muscle use.  Cardiovascular: Regular rate and rhythm, no murmurs / rubs / gallops. No extremity edema. 2+ pedal pulses. No carotid bruits.  Abdomen: Obese abdomen, no tenderness, no masses palpated, no hepatosplenomegaly. Bowel sounds positive.  Musculoskeletal: no clubbing / cyanosis. No joint deformity upper and lower extremities. Good ROM, no contractures, no atrophy. Normal muscle tone.  Skin: no rashes, lesions, ulcers. No induration.  Multiple tattoos.  Multiple scratches present.    Neurologic: Sensation intact. Strength 5/5 in all 4.  Psychiatric: Currently lacks judgment and insight.  Altered, not alert and oriented to self, age, location..  Confused mood.   EKG: independently reviewed, showing sinus tachycardia with rate of 121, QTc 459  Chest x-ray on Admission: I personally reviewed and I agree with radiologist reading as below.  CT Head Wo Contrast  Result Date: 06/19/2021 CLINICAL DATA:  Altered mental status. EXAM: CT HEAD WITHOUT CONTRAST TECHNIQUE: Contiguous axial images were obtained from the base of the skull through the vertex without intravenous contrast. COMPARISON:  None. FINDINGS: Brain: Patient was scanned in the decubitus position. No intracranial hemorrhage, mass effect, or midline shift. No hydrocephalus. The basilar cisterns are patent. No evidence of territorial infarct or acute ischemia. No extra-axial or intracranial fluid collection. Vascular: No hyperdense vessel or unexpected calcification. Skull: No fracture or focal lesion. Sinuses/Orbits: No acute findings. Periapical lucencies are noted about multiple upper  teeth. Other: None. IMPRESSION: No acute intracranial abnormality. Electronically Signed   By: Keith Rake M.D.   On: 06/19/2021 22:51   DG Chest Portable 1 View  Result Date: 06/19/2021 CLINICAL DATA:  Chest pain. EXAM: PORTABLE CHEST 1 VIEW COMPARISON:  07/15/2020 FINDINGS: Lung volumes are low. Stable cardiomegaly. Unchanged mediastinal contours. Mild vascular congestion without edema. No focal airspace disease, pleural effusion or pneumothorax. No acute osseous abnormalities are seen. IMPRESSION: Low lung volumes. Stable cardiomegaly with mild vascular congestion. Electronically Signed   By: Keith Rake M.D.   On: 06/19/2021 18:32    Labs on Admission: I have personally reviewed following labs  CBC: Recent Labs  Lab 06/19/21 1722  WBC 14.4*  NEUTROABS 12.9*  HGB 12.2  HCT 39.4  MCV 80.4  PLT 486*   Basic Metabolic Panel: Recent Labs  Lab 06/19/21 1722  NA 134*  K 3.9  CL 97*  CO2 28  GLUCOSE 386*  BUN 9  CREATININE 0.76  CALCIUM 9.4   GFR: Estimated Creatinine Clearance: 87.1 mL/min (by C-G formula based on SCr of 0.76 mg/dL).  Urine analysis:    Component Value Date/Time   COLORURINE YELLOW (A) 06/19/2021 2257   APPEARANCEUR CLEAR (A) 06/19/2021 2257   APPEARANCEUR  Cloudy 11/19/2014 2137   LABSPEC 1.024 06/19/2021 2257   LABSPEC 1.026 11/19/2014 2137   PHURINE 6.0 06/19/2021 2257   GLUCOSEU >=500 (A) 06/19/2021 2257   GLUCOSEU Negative 11/19/2014 2137   HGBUR SMALL (A) 06/19/2021 2257   BILIRUBINUR NEGATIVE 06/19/2021 2257   BILIRUBINUR Negative 11/19/2014 2137   KETONESUR 80 (A) 06/19/2021 2257   PROTEINUR 30 (A) 06/19/2021 2257   NITRITE NEGATIVE 06/19/2021 2257   LEUKOCYTESUR NEGATIVE 06/19/2021 2257   LEUKOCYTESUR 3+ 11/19/2014 2137   CRITICAL CARE Performed by: Briant Cedar Kataryna Mcquilkin  Total critical care time: 35 minutes  Critical care time was exclusive of separately billable procedures and treating other patients.  Critical care was necessary to  treat or prevent imminent or life-threatening deterioration.  Critical care was time spent personally by me on the following activities: development of treatment plan with patient and/or surrogate as well as nursing, discussions with consultants, evaluation of patient's response to treatment, examination of patient, obtaining history from patient or surrogate, ordering and performing treatments and interventions, ordering and review of laboratory studies, ordering and review of radiographic studies, pulse oximetry and re-evaluation of patient's condition.  Dr. Tobie Poet [facility]  If 7PM-7AM, please contact overnight-coverage provider If 7AM-7PM, please contact day coverage provider [email or web address]  06/20/2021, 1:09 AM

## 2021-06-20 NOTE — Plan of Care (Signed)

## 2021-06-20 NOTE — Sepsis Progress Note (Signed)
Elink following Code Sepsis  ° °Notified bedside nurse of need to draw repeat lactic acid.  °

## 2021-06-20 NOTE — Consult Note (Signed)
NAME: Lauren Velazquez  DOB: 1960/09/19  MRN: [id number]  Date/Time: 06/20/2021 4:28 PM  REQUESTING PROVIDER: Dr. Mortimer Fries Subjective:  REASON FOR CONSULT: Meningitis ?No history available from patient as she is intubated- I cannot reach her husband- left a message for him- chart reviewed Lauren Velazquez is a 60 y.o. female with a history of hypertension, DM, vertigo, HLD, anxiety, chronic pain on opioids,n presents with altered mentation on 06/19/2021. The ED triage note says that she was screaming and yelling in the stretcher when she arrived and was sticking her finger down her throat in an attempt to make her self vomit.  She was yelling that she was in pain and needs to get off the stretcher.  She was complaining that she needed to take her pain medication and she was at pain clinic patient. In the ED physician assessed her her main complaint was chest pain and she was worried that she had a heart attack.  She also had bizarre behavior.  In the ED initially her temperature was 98, BP 191/102, heart rate of 104, sats of 96%.  Temperature later was as high as 104. Labs revealed WBC of 15.4, Hb 12.2, platelet 507.  Creatinine was 0.76.  Urine tox screen was negative. She underwent CT head and it was normal. Blood culture was sent Due to her increasingly agitated state in spite of benzodiazepine and the need for lumbar puncture it was decided to intubate her so as to protect airway as she needed further sedation.  She received 50 mg of Benadryl, 5 mg Haldol and 2 mg of Ativan x2.  10 mg of Decadron also was given. She underwent a lumbar puncture on 06/20/2021 at 2:37 AM.  It revealed a WBC of 22 with 9% neutrophils, lymphocytes of 23 and monocytes of 63.  Total protein was 45.  Glucose was 204.  RBC was 317. She has been covered for meningitis with vancomycin, ampicillin, ceftriaxone and acyclovir.  She is admitted to ICU I am asked to see the for ruling out meningitis   medications prior to admission was  Effexor 75 mg twice a day Trazodone 100 mg in 2 to at nighttime Oxycodone 10/325 mg every 6 as needed Lisinopril 40 mg Lantus 40 units NovoLog Gabapentin Inhaler  Past Medical History:  Diagnosis Date   Anemia    not currently under treatment   Anxiety    Asthma    during allergy season   Diabetes mellitus without complication ([place])    Hypertension    Vertigo     Past Surgical History:  Procedure Laterality Date   CARPAL TUNNEL RELEASE Right 2015   KNEE ARTHROSCOPY Left 05/17/2018   Procedure: ARTHROSCOPY KNEE WITH LATERAL RELEASE, PARTIAL SYNOVECTOMY;  Surgeon: Hessie Knows, MD;  Location: [facility] ORS;  Service: Orthopedics;  Laterality: Left;   KNEE ARTHROSCOPY WITH LATERAL RELEASE Right 12/28/2017   Procedure: KNEE ARTHROSCOPY WITH LATERAL RELEASE;  Surgeon: Hessie Knows, MD;  Location: [facility] OR[facility];  Service: Orthopedics;  Laterality: Right;   TIBIAL TUBERCLERPLASTY Bilateral 06/01/2017   Procedure: TIBIAL TUBERCLE SPUR EXCISION;  Surgeon: Hessie Knows, MD;  Location: [facility] OR[facility];  Service: Orthopedics;  Laterality: Bilateral;    Social History   Socioeconomic History   Marital status: Married    Spouse name: Not on file   Number of children: Not on file   Years of education: Not on file   Highest education level: Not on file  Occupational History   Not on file  Tobacco Use  Smoking status: Never   Smokeless tobacco: Never  Vaping Use   Vaping Use: Never used  Substance and Sexual Activity   Alcohol use: No   Drug use: No   Sexual activity: Not on file  Other Topics Concern   Not on file  Social History Narrative   Not on file   Social Determinants of Health   Financial Resource Strain: Not on file  Food Insecurity: Not on file  Transportation Needs: Not on file  Physical Activity: Not on file  Stress: Not on file  Social Connections: Not on file  Intimate Partner Violence: Not on file   FH- not able to obtain because of her status Allergies  Allergen  Reactions   Atorvastatin Swelling   I? Current Facility-Administered Medications  Medication Dose Route Frequency Provider Last Rate Last Admin   0.9 %  sodium chloride infusion   Intravenous PRN Flora Lipps, MD   Stopped at 06/20/21 1421   acetaminophen (TYLENOL) tablet 1,000 mg  1,000 mg Per Tube Q6H Rust-Chester, Huel Cote, NP   1,000 mg at 06/20/21 1549   Or   acetaminophen (TYLENOL) suppository 650 mg  650 mg Rectal Q6H Rust-Chester, Huel Cote, NP       acyclovir (ZOVIRAX) 720 mg in dextrose 5 % 150 mL IVPB  10 mg/kg (Adjusted) Intravenous Q8H Cox, Amy N, DO 164 mL/hr at 06/20/21 1500 Infusion Verify at 06/20/21 1500   albuterol (PROVENTIL) (2.5 MG/3ML) 0.083% nebulizer solution 2.5 mg  2.5 mg Nebulization Q4H PRN Rust-Chester, Toribio Harbour L, NP       ampicillin (OMNIPEN) 2 g in sodium chloride 0.9 % 100 mL IVPB  2 g Intravenous Q4H Rust-Chester, Britton L, NP 300 mL/hr at 06/20/21 1550 2 g at 06/20/21 1550   arformoterol (BROVANA) nebulizer solution 15 mcg  15 mcg Nebulization BID Rust-Chester, Britton L, NP   15 mcg at 06/20/21 0834   budesonide (PULMICORT) nebulizer solution 0.25 mg  0.25 mg Nebulization BID Rust-Chester, Toribio Harbour L, NP   0.25 mg at 06/20/21 0834   cefTRIAXone (ROCEPHIN) 2 g in sodium chloride 0.9 % 100 mL IVPB  2 g Intravenous Q12H Rust-Chester, Britton L, NP   Paused at 06/20/21 1020   chlorhexidine gluconate (MEDLINE KIT) (PERIDEX) 0.12 % solution 15 mL  15 mL Mouth Rinse BID Flora Lipps, MD       Chlorhexidine Gluconate Cloth 2 % PADS 6 each  6 each Topical [id number] Flora Lipps, MD   6 each at 06/20/21 0504   diclofenac Sodium (VOLTAREN) 1 % topical gel 2 g  2 g Topical BID PRN Rust-Chester, Huel Cote, NP       docusate (COLACE) 50 MG/5ML liquid 100 mg  100 mg Per Tube BID Rust-Chester, Britton L, NP   100 mg at 06/20/21 0816   enoxaparin (LOVENOX) injection 52.5 mg  0.5 mg/kg Subcutaneous Q24H Rust-Chester, Britton L, NP   52.5 mg at 06/20/21 0811   famotidine (PEPCID)  IVPB 20 mg premix  20 mg Intravenous Q12H Rust-Chester, Britton L, NP   Stopped at 06/20/21 0530   fentaNYL (SUBLIMAZE) bolus via infusion 50-100 mcg  50-100 mcg Intravenous Q2H PRN Rust-Chester, Britton L, NP       fentaNYL 2556mg in NS 2520m(1065mml) infusion-PREMIX  0-200 mcg/hr Intravenous Continuous Rust-Chester, Britton L, NP 20 mL/hr at 06/20/21 1500 200 mcg/hr at 06/20/21 1500   gabapentin (NEURONTIN) capsule 600 mg  600 mg Per Tube BID Rust-Chester, Britton L, NP   600 mg at  06/20/21 0925   haloperidol lactate (HALDOL) injection 2 mg  2 mg Intramuscular Q6H PRN Cox, Amy N, DO       hydrALAZINE (APRESOLINE) injection 10-20 mg  10-20 mg Intravenous Q4H PRN Rust-Chester, Toribio Harbour L, NP       ibuprofen (ADVIL) tablet 800 mg  800 mg Oral Q6H PRN Rust-Chester, Britton L, NP       insulin aspart (novoLOG) injection 0-15 Units  0-15 Units Subcutaneous Q4H Rust-Chester, Britton L, NP   5 Units at 06/20/21 1200   insulin glargine-yfgn (SEMGLEE) injection 40 Units  40 Units Subcutaneous BID Cox, Amy N, DO   40 Units at 06/20/21 0454   lactated ringers infusion   Intravenous Continuous Cox, Amy N, DO   Stopped at 06/20/21 0500   MEDLINE mouth rinse  15 mL Mouth Rinse 10 times per day Flora Lipps, MD   15 mL at 06/20/21 1424   midazolam (VERSED) injection 2 mg  2 mg Intravenous Q1H PRN Rust-Chester, Britton L, NP       ondansetron (ZOFRAN) tablet 4 mg  4 mg Oral Q6H PRN Cox, Amy N, DO       Or   ondansetron (ZOFRAN) injection 4 mg  4 mg Intravenous Q6H PRN Cox, Amy N, DO       polyethylene glycol (MIRALAX / GLYCOLAX) packet 17 g  17 g Oral Daily PRN Rust-Chester, Britton L, NP       polyethylene glycol (MIRALAX / GLYCOLAX) packet 17 g  17 g Per Tube Daily Rust-Chester, Britton L, NP   17 g at 06/20/21 0926   propofol (DIPRIVAN) 1000 MG/100ML infusion  5-80 mcg/kg/min Intravenous Continuous Ward, Kristen N, DO 37.7 mL/hr at 06/20/21 1500 60 mcg/kg/min at 06/20/21 1500   traZODone (DESYREL) tablet 200  mg  200 mg Per Tube QHS PRN Rust-Chester, Huel Cote, NP       vancomycin (VANCOREADY) IVPB 750 mg/150 mL  750 mg Intravenous Q12H Renda Rolls, RPH   Stopped at 06/20/21 0908     Abtx:  Anti-infectives (From admission, onward)    Start     Dose/Rate Route Frequency Ordered Stop   06/20/21 1000  cefTRIAXone (ROCEPHIN) 2 g in sodium chloride 0.9 % 100 mL IVPB        2 g 200 mL/hr over 30 Minutes Intravenous Every 12 hours 06/20/21 0224     06/20/21 1000  vancomycin (VANCOCIN) IVPB 1000 mg/200 mL premix  Status:  Discontinued        1,000 mg 200 mL/hr over 60 Minutes Intravenous Every 12 hours 06/20/21 0301 06/20/21 0305   06/20/21 0800  vancomycin (VANCOREADY) IVPB 750 mg/150 mL        750 mg 150 mL/hr over 60 Minutes Intravenous Every 12 hours 06/20/21 0305     06/20/21 0400  ampicillin (OMNIPEN) 2 g in sodium chloride 0.9 % 100 mL IVPB        2 g 300 mL/hr over 20 Minutes Intravenous Every 4 hours 06/20/21 0224     06/20/21 0300  acyclovir (ZOVIRAX) 720 mg in dextrose 5 % 150 mL IVPB        10 mg/kg  72 kg (Adjusted) 164.4 mL/hr over 60 Minutes Intravenous Every 8 hours 06/20/21 0129     06/20/21 0130  vancomycin (VANCOCIN) 2,000 mg in sodium chloride 0.9 % 500 mL IVPB        2,000 mg 250 mL/hr over 120 Minutes Intravenous  Once 06/20/21 0116 06/20/21 0441  06/20/21 0115  vancomycin (VANCOREADY) IVPB 2000 mg/400 mL  Status:  Discontinued        2,000 mg 200 mL/hr over 120 Minutes Intravenous  Once 06/20/21 0100 06/20/21 0116   06/20/21 0000  ceFEPIme (MAXIPIME) 2 g in sodium chloride 0.9 % 100 mL IVPB        2 g 200 mL/hr over 30 Minutes Intravenous  Once 06/19/21 2354 06/20/21 0153   06/20/21 0000  vancomycin (VANCOCIN) IVPB 1000 mg/200 mL premix  Status:  Discontinued        1,000 mg 200 mL/hr over 60 Minutes Intravenous  Once 06/19/21 2354 06/20/21 0059       REVIEW OF SYSTEMS:  NA Objective:  VITALS:  BP 111/65   Pulse 88   Temp (!) 97.3 F (36.3 C)   Resp 19    Ht 5' 1" (1.549 m)   Wt 105.9 kg   LMP 08/15/2008   SpO2 97%   BMI 44.11 kg/m  PHYSICAL EXAM:  General: intubated , unresponsive Head: Normocephalic, without obvious abnormality, atraumatic. Eyes: Conjunctivae clear, anicteric sclerae. Pupils are equal ENT cannot assess Neck: rt IJ line Back: did nto assess Lungs: b/l air entry Heart: s1s2 Abdomen: Soft, nr,obese Bowel sounds normal. No masses Extremities:  foley Neurologic: cannot assess Pertinent Labs Lab Results CBC    Component Value Date/Time   WBC 13.9 (H) 06/20/2021 0500   RBC 4.16 06/20/2021 0500   HGB 10.4 (L) 06/20/2021 0500   HGB 10.1 (L) 11/19/2014 2136   HCT 34.0 (L) 06/20/2021 0500   HCT 33.3 (L) 11/19/2014 2136   PLT 419 (H) 06/20/2021 0500   PLT 506 (H) 11/19/2014 2136   MCV 81.7 06/20/2021 0500   MCV 74 (L) 11/19/2014 2136   MCH 25.0 (L) 06/20/2021 0500   MCHC 30.6 06/20/2021 0500   RDW 15.0 06/20/2021 0500   RDW 16.8 (H) 11/19/2014 2136   LYMPHSABS 1.1 06/19/2021 1722   LYMPHSABS 1.5 11/19/2014 2136   MONOABS 0.4 06/19/2021 1722   MONOABS 0.5 11/19/2014 2136   EOSABS 0.0 06/19/2021 1722   EOSABS 0.0 11/19/2014 2136   BASOSABS 0.1 06/19/2021 1722   BASOSABS 0.0 11/19/2014 2136    CMP Latest Ref Rng & Units 06/20/2021 06/19/2021 07/22/2020  Glucose 70 - 99 mg/dL 269(H) 386(H) -  BUN 6 - 20 mg/dL 7 9 -  Creatinine 0.44 - 1.00 mg/dL 0.69 0.76 0.56  Sodium 135 - 145 mmol/L 136 134(L) -  Potassium 3.5 - 5.1 mmol/L 4.2 3.9 -  Chloride 98 - 111 mmol/L 102 97(L) -  CO2 22 - 32 mmol/L 25 28 -  Calcium 8.9 - 10.3 mg/dL 8.5(L) 9.4 -  Total Protein 6.5 - 8.1 g/dL - - -  Total Bilirubin 0.3 - 1.2 mg/dL - - -  Alkaline Phos 38 - 126 U/L - - -  AST 15 - 41 U/L - - -  ALT 0 - 44 U/L - - -      Microbiology: Recent Results (from the past 240 hour(s))  Resp Panel by RT-PCR (Flu A&B, Covid) Nasopharyngeal Swab     Status: None   Collection Time: 06/19/21 10:57 PM   Specimen: Nasopharyngeal Swab;  Nasopharyngeal(NP) swabs in vial transport medium  Result Value Ref Range Status   SARS Coronavirus 2 by RT PCR NEGATIVE NEGATIVE Final    Comment: (NOTE) SARS-CoV-2 target nucleic acids are NOT DETECTED.  The SARS-CoV-2 RNA is generally detectable in upper respiratory specimens during the acute phase of infection.  The lowest concentration of SARS-CoV-2 viral copies this assay can detect is 138 copies/mL. A negative result does not preclude SARS-Cov-2 infection and should not be used as the sole basis for treatment or other patient management decisions. A negative result may occur with  improper specimen collection/handling, submission of specimen other than nasopharyngeal swab, presence of viral mutation(s) within the areas targeted by this assay, and inadequate number of viral copies(<138 copies/mL). A negative result must be combined with clinical observations, patient history, and epidemiological information. The expected result is Negative.  Fact Sheet for Patients:  EntrepreneurPulse.com.au  Fact Sheet for Healthcare Providers:  IncredibleEmployment.be  This test is no t yet approved or cleared by the [place] FDA and  has been authorized for detection and/or diagnosis of SARS-CoV-2 by FDA under an Emergency Use Authorization (EUA). This EUA will remain  in effect (meaning this test can be used) for the duration of the COVID-19 declaration under Section 564(b)(1) of the Act, 21 U.S.C.section 360bbb-3(b)(1), unless the authorization is terminated  or revoked sooner.       Influenza A by PCR NEGATIVE NEGATIVE Final   Influenza B by PCR NEGATIVE NEGATIVE Final    Comment: (NOTE) The Xpert Xpress SARS-CoV-2/FLU/RSV plus assay is intended as an aid in the diagnosis of influenza from Nasopharyngeal swab specimens and should not be used as a sole basis for treatment. Nasal washings and aspirates are unacceptable for Xpert Xpress  SARS-CoV-2/FLU/RSV testing.  Fact Sheet for Patients: EntrepreneurPulse.com.au  Fact Sheet for Healthcare Providers: IncredibleEmployment.be  This test is not yet approved or cleared by the [place] FDA and has been authorized for detection and/or diagnosis of SARS-CoV-2 by FDA under an Emergency Use Authorization (EUA). This EUA will remain in effect (meaning this test can be used) for the duration of the COVID-19 declaration under Section 564(b)(1) of the Act, 21 U.S.C. section 360bbb-3(b)(1), unless the authorization is terminated or revoked.  Performed at [facility], [address] [place], [place] [address]   Blood culture (routine x 2)     Status: None (Preliminary result)   Collection Time: 06/19/21 11:05 PM   Specimen: BLOOD  Result Value Ref Range Status   Specimen Description BLOOD BLOOD LEFT HAND  Final   Special Requests   Final    BOTTLES DRAWN AEROBIC AND ANAEROBIC Blood Culture adequate volume   Culture   Final    NO GROWTH < 12 HOURS Performed at [facility], [address], [place], [place] [address]    Report Status PENDING  Incomplete  Blood culture (routine x 2)     Status: None (Preliminary result)   Collection Time: 06/20/21  1:30 AM   Specimen: BLOOD  Result Value Ref Range Status   Specimen Description BLOOD RIGHT ANTECUBITAL  Final   Special Requests   Final    BOTTLES DRAWN AEROBIC AND ANAEROBIC Blood Culture results may not be optimal due to an excessive volume of blood received in culture bottles   Culture   Final    NO GROWTH < 12 HOURS Performed at [facility], [address] [place], [place] [address]    Report Status PENDING  Incomplete  MRSA Next Gen by PCR, Nasal     Status: None   Collection Time: 06/20/21  1:35 AM   Specimen: Nasal Mucosa; Nasal Swab  Result Value Ref Range Status   MRSA by PCR Next Gen NOT DETECTED NOT DETECTED Final    Comment: (NOTE) The  GeneXpert  MRSA Assay (FDA approved for NASAL specimens only), is one component of a comprehensive MRSA colonization surveillance program. It is not intended to diagnose MRSA infection nor to guide or monitor treatment for MRSA infections. Test performance is not FDA approved in patients less than 72 years old. Performed at [facility], [place], [place] [address]   CSF culture w Gram Stain     Status: None (Preliminary result)   Collection Time: 06/20/21  2:37 AM   Specimen: CSF; Cerebrospinal Fluid  Result Value Ref Range Status   Specimen Description CSF  Final   Special Requests NONE  Final   Gram Stain   Final    NO ORGANISMS SEEN WBC SEEN RED BLOOD CELLS PRESENT Performed at [facility], [address], [place], [place] [address]    Culture PENDING  Incomplete   Report Status PENDING  Incomplete  CSF- WBC 22 ( 9% N, 23. L, 68 M) TP 45 and glucose 204 RBC 317  IMAGING RESULTS: CXR no acute findings  CT head- non acute changes CT abdomen/pelvis- 5.4 cm benign-appearing left adnexal cyst. B/l nephrolithiasis  I have personally reviewed the films ? Impression/Recommendation ? ?pt presenting with altered mental status and bizarre behavior- had to be intubated for airway protection for LP Fever with AMS- CSF mild pleocytosis- more lympatic With no history available from her and not able to reach her husband unable to get a full picture This does not look like bacterial meningits as per csf findings.  Need to r/o HSV encephalitis Okay to continue acyclovir and triple antibiotics  to cover bacteria ( including listeria ) for now Recommend MRI with contrast, EEG ( r/o seizures)  Get LFT  DM Discussed with care team Note:  This document was prepared using Dragon voice recognition software and may include unintentional dictation errors.

## 2021-06-20 NOTE — Progress Notes (Signed)
Pt was unarrousible during the day, however gag, cough present with mouth care. Asynchrony on the vent, Propofol and Fentanyl titrated accordingly. MD is aware .

## 2021-06-20 NOTE — Procedures (Signed)
Central Venous Catheter Insertion Procedure Note  Lauren Velazquez  [id number]  23-Jul-1961  Date:06/20/21  Time:6:22 AM   Provider Performing:Mozel Burdett L Rust-Chester   Procedure: Insertion of Non-tunneled Central Venous Catheter(36556) with US guidance (07680)   Indication(s) Medication administration and Difficult access  Consent Unable to obtain consent due to emergent nature of procedure.  Anesthesia Topical only with 1% lidocaine   Timeout Verified patient identification, verified procedure, site/side was marked, verified correct patient position, special equipment/implants available, medications/allergies/relevant history reviewed, required imaging and test results available.  Sterile Technique Maximal sterile technique including full sterile barrier drape, hand hygiene, sterile gown, sterile gloves, mask, hair covering, sterile ultrasound probe cover (if used).  Procedure Description Area of catheter insertion was cleaned with chlorhexidine and draped in sterile fashion.  With real-time ultrasound guidance a central venous catheter was placed into the right internal jugular vein. Nonpulsatile blood flow and easy flushing noted in all ports.  The catheter was sutured in place and sterile dressing applied.  Complications/Tolerance None; patient tolerated the procedure well. Chest X-ray is ordered to verify placement for internal jugular or subclavian cannulation.   Chest x-ray is not ordered for femoral cannulation.  EBL Minimal  Specimen(s) None   Betsey Holiday, AGACNP-BC Acute Care Nurse Practitioner [name]   [phone] / [phone] Please see Amion for pager details.

## 2021-06-20 NOTE — ED Provider Notes (Signed)
12:15 AM  Assumed care at shift change.  Patient here with altered mental status and found to be febrile, tachycardic and have an elevated lactic.  Receiving 30 mL/kg IV fluid bolus based on ideal weight, vancomycin and cefepime.  No obvious source of infection based on work-up today.  Labs show leukocytosis of 14,000 but COVID, flu unremarkable, chest x-ray clear and urine does not show any infection.  Will likely need lumbar puncture to rule out meningitis, encephalitis but patient will need sedation due to agitation.  Head CT shows no acute abnormality.  We will add on IV acyclovir to cover for HSV.  Patient will need admission.  Patient has received sedation for altered mental status, agitation.   12:34 AM  Discussed patient's case with hospitalist, Dr. Tobie Poet.  I have recommended admission and patient (and family if present) agree with this plan. Admitting physician will place admission orders.   I reviewed all nursing notes, vitals, pertinent previous records and reviewed/interpreted all EKGs, lab and urine results, imaging (as available).   2:10 AM  Pt becoming increasingly agitated and not responsive to benzodiazepines.  Decision made to intubate patient for airway protection and further sedation so that we can safely care for her and perform her lumbar puncture.  Discussed with Domingo Pulse Chest Rust NP with critical care for admission.   2:40 AM  LP performed with some difficulty due to patient moving throughout procedure despite receiving propofol and fentanyl boluses and being on propofol and fentanyl infusions.  She is also hypertensive which I think is due to inadequate sedation.  Discussed with ICU provider who will order IV Versed and hydralazine to give as needed.  She is also still febrile despite getting rectal Tylenol.  ICU provider to order ibuprofen given normal renal function.  She has an ICU bed ready.   I reviewed all nursing notes and pertinent previous records as available.  I  have reviewed and interpreted any EKGs, lab and urine results, imaging (as available).    CRITICAL CARE Performed by: Pryor Curia   Total critical care time: 65 minutes  Critical care time was exclusive of separately billable procedures and treating other patients.  Critical care was necessary to treat or prevent imminent or life-threatening deterioration.  Critical care was time spent personally by me on the following activities: development of treatment plan with patient and/or surrogate as well as nursing, discussions with consultants, evaluation of patient's response to treatment, examination of patient, obtaining history from patient or surrogate, ordering and performing treatments and interventions, ordering and review of laboratory studies, ordering and review of radiographic studies, pulse oximetry and re-evaluation of patient's condition.    Procedure Name: Intubation Date/Time: 06/20/2021 2:12 AM Performed by: Nhan Qualley, Delice Bison, DO Pre-anesthesia Checklist: Patient identified, Patient being monitored, Suction available, Emergency Drugs available and Timeout performed Oxygen Delivery Method: Ambu bag Preoxygenation: Pre-oxygenation with 100% oxygen Induction Type: Rapid sequence Ventilation: Mask ventilation without difficulty Laryngoscope Size: Glidescope and 4 Grade View: Grade II Tube size: 7.5 mm Number of attempts: 1 Airway Equipment and Method: Patient positioned with wedge pillow and Video-laryngoscopy Placement Confirmation: ETT inserted through vocal cords under direct vision Secured at: 22 cm Tube secured with: ETT holder    .Lumbar Puncture  Date/Time: 06/20/2021 2:44 AM Performed by: Emalene Welte, Delice Bison, DO Authorized by: Kaelyn Nauta, Delice Bison, DO   Consent:    Consent obtained:  Emergent situation Universal protocol:    Relevant documents present and verified: yes  Test results available: yes     Imaging studies available: yes     Required blood products,  implants, devices, and special equipment available: yes     Immediately prior to procedure a time out was called: yes     Site/side marked: yes     Patient identity confirmed:  Arm band Pre-procedure details:    Procedure purpose:  Diagnostic   Preparation: Patient was prepped and draped in usual sterile fashion   Anesthesia:    Anesthesia method:  Local infiltration   Local anesthetic:  Lidocaine 1% w/o epi Procedure details:    Lumbar space:  L4-L5 interspace   Patient position:  L lateral decubitus   Needle gauge:  22   Needle type:  Spinal needle - Quincke tip   Needle length (in):  3.5   Ultrasound guidance: no     Number of attempts:  2   Fluid appearance:  Blood-tinged then clearing   Tubes of fluid:  4   Total volume (ml):  10 Post-procedure details:    Puncture site:  Adhesive bandage applied and direct pressure applied   Procedure completion:  Tolerated with difficulty    Orvan Papadakis, Layla Maw, DO 06/20/21 0246

## 2021-06-20 NOTE — Progress Notes (Addendum)
Pharmacy Antibiotic Note  Lauren Velazquez is a 60 y.o. female admitted on 06/19/2021 with meningitis.  Pharmacy has been consulted for Acyclovir and Vancomycin dosing.  Plan: Acyclovir 10 mg/kg (Adjusted BW) q8hr per indication and renal function.  Vancomycin Pt given initial dose of Vancomycin 2 gm Vancomycin 750 mg IV Q 12 hrs.  Goal AUC 400-550. Expected AUC: 499 SCr used: 0.76, Vd used: 0.5, BMI 42.6  Pharmacy will continue to follow.   Height: 5\' 2"  (157.5 cm) Weight: 104.8 kg (231 lb) IBW/kg (Calculated) : 50.1  Temp (24hrs), Avg:101.3 F (38.5 C), Min:98 F (36.7 C), Max:103.9 F (39.9 C)  Recent Labs  Lab 06/19/21 1722 06/19/21 2257  WBC 14.4*  --   CREATININE 0.76  --   LATICACIDVEN  --  4.5*    Estimated Creatinine Clearance: 87.1 mL/min (by C-G formula based on SCr of 0.76 mg/dL).    Allergies  Allergen Reactions   Atorvastatin Swelling    Antimicrobials this admission: 11/6 Vancomycin >>  11/6 Ampicillin >> 11/6 Ceftriaxone >> 11/6 Acyclovir >> 11/6 Cefepime >> x 1 dose  Microbiology results: 11/6 BCx: Pending 11/6 CSF Cx: Pending   Thank you for allowing pharmacy to be a part of this patient's care.  13/6, PharmD, MBA 06/20/2021 1:20 AM

## 2021-06-20 NOTE — ED Notes (Signed)
Patient became agitated and uncooperative while trying to do her CT scan. EDP notified.

## 2021-06-20 NOTE — ED Notes (Signed)
Ice packs placed to neck, bilateral axillary and bilateral groin.

## 2021-06-20 NOTE — Progress Notes (Signed)
CODE SEPSIS - PHARMACY COMMUNICATION  **Broad Spectrum Antibiotics should be administered within 1 hour of Sepsis diagnosis**  Time Code Sepsis Called/Page Received: 1217  Antibiotics Ordered: Cefepime & Vancomycin  Time of 1st antibiotic administration: 0123  Otelia Sergeant, PharmD, MBA 06/20/2021 1:20 AM

## 2021-06-20 NOTE — H&P (Signed)
NAME:  Lauren Velazquez, MRN:  [id number], DOB:  05-Jan-1961, LOS: 0 ADMISSION DATE:  06/19/2021, CONSULTATION DATE:  06/20/21 REFERRING MD:  Dr. Elesa Massed, CHIEF COMPLAINT:  Altered Mental Status   History of Present Illness:  60 year old female presenting to [facility] ED from home via EMS for multiple complaints including stroke, chest pain, shortness of breath and pain all over. (Per ED documentation) ED course: Per ED documentation upon arrival patient was agitated and extremely altered, exhibiting bizarre behavior.  She was screaming and yelling sticking her finger down her throat and attempt to make her self vomit.  While sitting in a recliner began attempting to flip it over eventually requiring physical restraint for her own safety.  ED staff attempted to sedate her as she was kicking and swinging arms at staff and husband.  Her main complaint to the ED provider was chest pain, she stated she was worried that she might have had a heart attack as well as feeling very dehydrated.  She also insisted on being given her chronic pain medication, however she also told staff she will take all her pills and overdose. Patient was placed under IVC.  Per ED documentation, husband denied recreational drug use or any new medications (unclear if this comment was made to EDP or EMS).  While in the ED patient became febrile and sepsis protocol was initiated.  No definitive source of infection noted in CXR/UA, this in combination with severely altered mental status raise concern for meningitis.  Due to patient's increasingly agitated state refractory to benzodiazepines the decision was made by the EDP to mechanically intubate for airway protection in order to provide further sedation so that an LP could be performed safely  Medications given: 50 mg Benadryl, 5 mg Haldol, 2 mg Ativan x2, cefepime/vancomycin/acyclovir, 10 mg of Decadron, total 2.6 L IV fluid bolus, succinylcholine for RSI, 650 mg acetaminophen for fever Initial  Vitals: Severely febrile at 103.9, tachypneic 32, tachycardic 134, hypertensive urgency 232/97 & 95% on room air Significant labs: (Labs/ Imaging personally reviewed) I, Cheryll Cockayne Rust-Chester, AGACNP-BC, personally viewed and interpreted this ECG. EKG Interpretation: Date: 06/19/2021, EKG Time: 23:23, Rate: 121, Rhythm: Sinus tachycardia, QRS Axis: RAD, Intervals: Prolonged QTC, ST/T Wave abnormalities: None, Narrative Interpretation: Sinus tachycardia with RAD Chemistry: Na+: 134, K+: 3.9, BUN/Cr.:  9/0.76, Serum CO2/ AG: 28/9 Hematology: WBC: 14.4, Hgb: 12.2,  Normal troponin: 8> 15,  Lactic/ PCT: 4.5/pending, COVID-19 & Influenza A/B: negative ABG: 7.46/32/177/22.8 CXR 06/20/21: mild central vascular congestion CT head wo contrast 06/20/21: no acute intracranial abnormality  PCCM consulted for admission due to need for emergent intubation for airway protection and mechanical ventilatory support.  Pertinent  Medical History  T2DM HTN Vertigo HLD Anxiety Chronic Back pain COVID-19  Significant Hospital Events: Including procedures, antibiotic start and stop dates in addition to other pertinent events   06/20/21- Admit to ICU s/p mechanical intubation for airway protection in the setting of increased sedation needs due to continued agitation s/t suspected meningitis  Interim History / Subjective:  Patient intubated & sedated, severely febrile with ice packs in place  Objective   Blood pressure (!) 132/92, pulse (!) 134, temperature (!) 103.9 F (39.9 C), temperature source Axillary, resp. rate 19, height 5\' 2"  (1.575 m), weight 104.8 kg, last menstrual period 08/15/2008, SpO2 95 %.       No intake or output data in the 24 hours ending 06/20/21 0214 Filed Weights   06/19/21 1716  Weight: 104.8 kg    Examination: General:  Adult female, critically ill, lying in bed intubated & sedated requiring mechanical ventilation, NAD HEENT: MM pink/moist, anicteric, atraumatic, neck  supple Neuro: sedated, unable to follow commands, PERRL +3 CV: s1s2 RRR, NSR on monitor, no r/m/g Pulm: Regular, non labored on PRVC @ 40% & PEEP 5, breath sounds coarse-BUL & diminished-BLL GI: soft, obese, bs x 4 GU: foley in place with clear yellow urine Skin: multiple scratches scattered and scabbed Extremities: warm/dry, pulses + 2 R/P, no edema noted  Resolved Hospital Problem list     Assessment & Plan:  Sepsis without septic shock due to suspected meningitis Lactic: 4.5, Baseline PCT: pending, UA: +ketones +protein, CXR: mild central vascular congestion, CTH: negative, LP: pending Initial interventions/workup included: 1.6 L of LR & 1 L NS & Cefepime/ Vancomycin/ acyclovir - severely febrile > cooling blanket ordered, (1st dose of tylenol given almost 4 hours ago with daylight savings consideration) additional doses of tylenol & ibuprofen ordered - Supplemental oxygen as needed, to maintain SpO2 > 90% - f/u cultures, trend lactic/ PCT, f/u CK level - Daily CBC, monitor WBC/ fever curve - IV antibiotics for meningitis coverage: ceftriaxone, vancomycin, ampicillin & acyclovir - consult ID - IVF hydration as needed: LR @ 150 mL/h - Strict I/O's: alert provider if UOP < 0.5 mL/kg/hr  Acute Respiratory Failure and mechanical intubation for airway protection secondary to sedation requirements in the setting of severe agitation & encephalopathy  PMHx: suspected OSA (not completely w/u outpt) - Ventilator settings: PRVC  8 mL/kg, 40 % FiO2, 5 PEEP, continue ventilator support & lung protective strategies - Wean PEEP & FiO2 as tolerated, maintain SpO2 > 90% - Head of bed elevated 30 degrees, VAP protocol in place - Plateau pressures less than 30 cm H20  - Intermittent chest x-ray & ABG PRN - Daily WUA with SBT as tolerated  - Ensure adequate pulmonary hygiene  - F/u cultures, trend PCT - Budesonide & Brovana nebs BID, bronchodilators PRN - PAD protocol in place: continue Fentanyl  drip & Propofol drip with Versed PRN  Acute Encephalopathy secondary to suspected meningitis PMHx: Anxiety, chronic pain - supportive care - psychiatry consult in place - consider neurology consult - continue outpatient: gabapentin, trazodone, Volatren gel - outpatient Effexor on hold while intubated, consider restarting as patient stabilizes  Hypertension Urgency PMHx: HLD, HTN Do believe this will be better controlled with improved sedation & fever control - hydralazine PRN Q 4, can consider nicardipine drip if needed - continuous cardiac monitoring - hold outpatient lisinopril, consider restarting as patient stabilizes  Poorly controlled Type 2 Diabetes Mellitus Hemoglobin A1C: pending - Monitor CBG Q 4 hours - SSI moderate dosing - continue outpatient Lantus dosing: 40 units BID - target range while in ICU: 140-180 - follow ICU hyper/hypo-glycemia protocol  Best Practice (right click and "Reselect all SmartList Selections" daily)  Diet/type: NPO w/ meds via tube DVT prophylaxis: LMWH GI prophylaxis: H2B Lines: N/A Foley:  Yes, and it is still needed Code Status:  full code Last date of multidisciplinary goals of care discussion [N/A] attempted to reach the patient's husband & daughter, voicemail left for each.  Labs   CBC: Recent Labs  Lab 06/19/21 1722  WBC 14.4*  NEUTROABS 12.9*  HGB 12.2  HCT 39.4  MCV 80.4  PLT 507*    Basic Metabolic Panel: Recent Labs  Lab 06/19/21 1722  NA 134*  K 3.9  CL 97*  CO2 28  GLUCOSE 386*  BUN 9  CREATININE 0.76  CALCIUM  9.4   GFR: Estimated Creatinine Clearance: 87.1 mL/min (by C-G formula based on SCr of 0.76 mg/dL). Recent Labs  Lab 06/19/21 1722 06/19/21 2257  WBC 14.4*  --   LATICACIDVEN  --  4.5*    Liver Function Tests: No results for input(s): AST, ALT, ALKPHOS, BILITOT, PROT, ALBUMIN in the last 168 hours. No results for input(s): LIPASE, AMYLASE in the last 168 hours. No results for input(s):  AMMONIA in the last 168 hours.  ABG    Component Value Date/Time   PHART 7.46 (H) 06/20/2021 0204   PCO2ART 32 06/20/2021 0204   PO2ART 177 (H) 06/20/2021 0204   HCO3 22.8 06/20/2021 0204   ACIDBASEDEF 0.5 06/20/2021 0204   O2SAT 99.6 06/20/2021 0204     Coagulation Profile: No results for input(s): INR, PROTIME in the last 168 hours.  Cardiac Enzymes: No results for input(s): CKTOTAL, CKMB, CKMBINDEX, TROPONINI in the last 168 hours.  HbA1C: Hgb A1c MFr Bld  Date/Time Value Ref Range Status  03/06/2020 07:05 AM 9.1 (H) 4.8 - 5.6 % Final    Comment:    (NOTE)         Prediabetes: 5.7 - 6.4         Diabetes: >6.4         Glycemic control for adults with diabetes: <7.0     CBG: Recent Labs  Lab 06/20/21 0117  GLUCAP 325*    Review of Systems:   UTA- patient intubated & sedated  Past Medical History:  She,  has a past medical history of Anemia, Anxiety, Asthma, Diabetes mellitus without complication (HCC), Hypertension, and Vertigo.   Surgical History:   Past Surgical History:  Procedure Laterality Date   CARPAL TUNNEL RELEASE Right 2015   KNEE ARTHROSCOPY Left 05/17/2018   Procedure: ARTHROSCOPY KNEE WITH LATERAL RELEASE, PARTIAL SYNOVECTOMY;  Surgeon: Kennedy Bucker, MD;  Location: [facility] ORS;  Service: Orthopedics;  Laterality: Left;   KNEE ARTHROSCOPY WITH LATERAL RELEASE Right 12/28/2017   Procedure: KNEE ARTHROSCOPY WITH LATERAL RELEASE;  Surgeon: Kennedy Bucker, MD;  Location: [facility] ORS;  Service: Orthopedics;  Laterality: Right;   TIBIAL TUBERCLERPLASTY Bilateral 06/01/2017   Procedure: TIBIAL TUBERCLE SPUR EXCISION;  Surgeon: Kennedy Bucker, MD;  Location: [facility] ORS;  Service: Orthopedics;  Laterality: Bilateral;     Social History:   reports that she has never smoked. She has never used smokeless tobacco. She reports that she does not drink alcohol and does not use drugs.   Family History:  Her family history is not on file.   Allergies Allergies   Allergen Reactions   Atorvastatin Swelling     Home Medications  Prior to Admission medications   Medication Sig Start Date End Date Taking? Authorizing Provider  diclofenac sodium (VOLTAREN) 1 % GEL Apply 2 g topically 2 (two) times daily as needed (pain).    Yes [provider]  ferrous gluconate (FERGON) 324 MG tablet Take 324 mg by mouth 3 (three) times daily. 04/02/21  Yes [provider]  Fluticasone-Umeclidin-Vilant 100-62.5-25 MCG/ACT AEPB Inhale 1 puff into the lungs daily. 05/18/21  Yes [provider]  gabapentin (NEURONTIN) 300 MG capsule Take 600 mg by mouth 2 (two) times daily.    Yes [provider]  insulin aspart (NOVOLOG) 100 UNIT/ML injection Inject 0-20 Units into the skin 3 (three) times daily with meals. (based upon blood sugar reading)   Yes [provider]  LANTUS SOLOSTAR 100 UNIT/ML Solostar Pen Inject 40 Units into the skin 2 (  two) times daily. This is decreased from prior dose of 70 units twice daily due to low blood glucose. 07/22/20  Yes Darlin Priestly, MD  lisinopril (ZESTRIL) 40 MG tablet Take 40 mg by mouth daily.    Yes [provider]  oxyCODONE-acetaminophen (PERCOCET) 10-325 MG tablet Take 1 tablet by mouth every 6 (six) hours as needed for pain.  04/12/17  Yes [provider]  traZODone (DESYREL) 100 MG tablet Take 200 mg by mouth at bedtime as needed for sleep.   Yes [provider]  venlafaxine XR (EFFEXOR-XR) 75 MG 24 hr capsule Take 75-150 mg by mouth 2 (two) times daily.    Yes [provider]  VENTOLIN HFA 108 (90 Base) MCG/ACT inhaler Inhale 2 puffs into the lungs every 6 (six) hours as needed for wheezing or shortness of breath.    Yes [provider]     Critical care time: 65 minutes       Betsey Holiday, AGACNP-BC Acute Care Nurse Practitioner [facility]   [phone] / [phone] Please see Amion for pager details.

## 2021-06-20 NOTE — Progress Notes (Signed)
PHARMACIST - PHYSICIAN COMMUNICATION  CONCERNING:  Enoxaparin (Lovenox) for DVT Prophylaxis    RECOMMENDATION: Patient was prescribed enoxaprin 40mg  q24 hours for VTE prophylaxis.   Filed Weights   06/19/21 1716  Weight: 104.8 kg (231 lb)    Body mass index is 42.25 kg/m.  Estimated Creatinine Clearance: 87.1 mL/min (by C-G formula based on SCr of 0.76 mg/dL).   Based on [facility] policy patient is candidate for enoxaparin 0.5mg /kg TBW SQ every 24 hours based on BMI being >30.  DESCRIPTION: Pharmacy has adjusted enoxaparin dose per [facility] policy.  Patient is now receiving enoxaparin 0.5 mg/kg every 24 hours   [facility], PharmD, [facility] 06/20/2021 2:41 AM

## 2021-06-21 ENCOUNTER — Inpatient Hospital Stay: Payer: BLUE CROSS/BLUE SHIELD

## 2021-06-21 ENCOUNTER — Other Ambulatory Visit: Payer: Self-pay

## 2021-06-21 ENCOUNTER — Inpatient Hospital Stay (HOSPITAL_COMMUNITY)
Admit: 2021-06-21 | Discharge: 2021-06-21 | Disposition: A | Discharge status: Home / Self Care | Payer: BLUE CROSS/BLUE SHIELD | Attending: Pulmonary Disease | Admitting: Pulmonary Disease

## 2021-06-21 ENCOUNTER — Inpatient Hospital Stay: Payer: Self-pay

## 2021-06-21 DIAGNOSIS — R410 Disorientation, unspecified: Secondary | ICD-10-CM | POA: Diagnosis not present

## 2021-06-21 DIAGNOSIS — R509 Fever, unspecified: Secondary | ICD-10-CM

## 2021-06-21 DIAGNOSIS — R4182 Altered mental status, unspecified: Secondary | ICD-10-CM | POA: Diagnosis not present

## 2021-06-21 DIAGNOSIS — I428 Other cardiomyopathies: Secondary | ICD-10-CM

## 2021-06-21 DIAGNOSIS — D72829 Elevated white blood cell count, unspecified: Secondary | ICD-10-CM | POA: Diagnosis not present

## 2021-06-21 DIAGNOSIS — G039 Meningitis, unspecified: Secondary | ICD-10-CM | POA: Diagnosis not present

## 2021-06-21 DIAGNOSIS — G9340 Encephalopathy, unspecified: Secondary | ICD-10-CM | POA: Diagnosis not present

## 2021-06-21 LAB — HEPATIC FUNCTION PANEL
ALT: 13 U/L (ref 0–44)
AST: 18 U/L (ref 15–41)
Albumin: 2.9 g/dL — ABNORMAL LOW (ref 3.5–5.0)
Alkaline Phosphatase: 73 U/L (ref 38–126)
Bilirubin, Direct: <0.1 mg/dL (ref 0.0–0.2)
Total Bilirubin: 0.4 mg/dL (ref 0.3–1.2)
Total Protein: 6.3 g/dL — ABNORMAL LOW (ref 6.5–8.1)

## 2021-06-21 LAB — ECHOCARDIOGRAM COMPLETE
AR max vel: 1.83 cm2
AV Area VTI: 1.91 cm2
AV Area mean vel: 1.74 cm2
AV Mean grad: 6 mmHg
AV Peak grad: 11.2 mmHg
Ao pk vel: 1.67 m/s
Area-P 1/2: 3.23 cm2
Height: 61 in
MV VTI: 2.06 cm2
S' Lateral: 2.4 cm
Weight: 3643.76 [oz_av]

## 2021-06-21 LAB — BASIC METABOLIC PANEL
Anion gap: 9 (ref 5–15)
BUN: 8 mg/dL (ref 6–20)
CO2: 27 mmol/L (ref 22–32)
Calcium: 8.7 mg/dL — ABNORMAL LOW (ref 8.9–10.3)
Chloride: 104 mmol/L (ref 98–111)
Creatinine, Ser: 0.56 mg/dL (ref 0.44–1.00)
GFR, Estimated: >60 mL/min (ref >60)
Glucose, Bld: 152 mg/dL — ABNORMAL HIGH (ref 70–99)
Potassium: 3.2 mmol/L — ABNORMAL LOW (ref 3.5–5.1)
Sodium: 140 mmol/L (ref 135–145)

## 2021-06-21 LAB — CBC
HCT: 32.9 % — ABNORMAL LOW (ref 36.0–46.0)
Hemoglobin: 9.9 g/dL — ABNORMAL LOW (ref 12.0–15.0)
MCH: 24.4 pg — ABNORMAL LOW (ref 26.0–34.0)
MCHC: 30.1 g/dL (ref 30.0–36.0)
MCV: 81.2 fL (ref 80.0–100.0)
Platelets: 423 10*3/uL — ABNORMAL HIGH (ref 150–400)
RBC: 4.05 MIL/uL (ref 3.87–5.11)
RDW: 15.2 % (ref 11.5–15.5)
WBC: 15 10*3/uL — ABNORMAL HIGH (ref 4.0–10.5)
nRBC: 0 % (ref 0.0–0.2)

## 2021-06-21 LAB — CK: Total CK: 210 U/L (ref 38–234)

## 2021-06-21 LAB — GLUCOSE, CAPILLARY
Glucose-Capillary: 364 mg/dL — ABNORMAL HIGH (ref 70–99)
Glucose-Capillary: 146 mg/dL — ABNORMAL HIGH (ref 70–99)
Glucose-Capillary: 210 mg/dL — ABNORMAL HIGH (ref 70–99)
Glucose-Capillary: 167 mg/dL — ABNORMAL HIGH (ref 70–99)
Glucose-Capillary: 218 mg/dL — ABNORMAL HIGH (ref 70–99)
Glucose-Capillary: 237 mg/dL — ABNORMAL HIGH (ref 70–99)
Glucose-Capillary: 221 mg/dL — ABNORMAL HIGH (ref 70–99)

## 2021-06-21 LAB — TRIGLYCERIDES: Triglycerides: 298 mg/dL — ABNORMAL HIGH (ref <150)

## 2021-06-21 LAB — MAGNESIUM: Magnesium: 2 mg/dL (ref 1.7–2.4)

## 2021-06-21 LAB — PHOSPHORUS: Phosphorus: 3 mg/dL (ref 2.5–4.6)

## 2021-06-21 MED ORDER — DEXMEDETOMIDINE HCL IN NACL 400 MCG/100ML IV SOLN
0.4000 ug/kg/h | INTRAVENOUS | Status: DC
  Administered 2021-06-21: 0.8 ug/kg/h via INTRAVENOUS
  Administered 2021-06-21: 0.6 ug/kg/h via INTRAVENOUS
  Administered 2021-06-21: 0.9 ug/kg/h via INTRAVENOUS
  Administered 2021-06-21: 1.1 ug/kg/h via INTRAVENOUS
  Administered 2021-06-25 (×2): 0.6 ug/kg/h via INTRAVENOUS
  Administered 2021-06-25: 0.4 ug/kg/h via INTRAVENOUS
  Administered 2021-06-26: 1.2 ug/kg/h via INTRAVENOUS
  Administered 2021-06-26: 0.6 ug/kg/h via INTRAVENOUS
  Administered 2021-06-26: 1.2 ug/kg/h via INTRAVENOUS
  Administered 2021-06-27: 0.9 ug/kg/h via INTRAVENOUS
  Administered 2021-06-27: 0.6 ug/kg/h via INTRAVENOUS
  Administered 2021-06-27: 0.9 ug/kg/h via INTRAVENOUS
  Administered 2021-06-27: 1 ug/kg/h via INTRAVENOUS
  Administered 2021-06-27: 0.9 ug/kg/h via INTRAVENOUS
  Filled 2021-06-21 (×17): qty 100

## 2021-06-21 MED ORDER — NOREPINEPHRINE 4 MG/250ML-% IV SOLN
0.0000 ug/min | INTRAVENOUS | Status: DC
  Administered 2021-06-21: 5 ug/min via INTRAVENOUS
  Administered 2021-06-21: 4 ug/min via INTRAVENOUS
  Administered 2021-06-23: 5 ug/min via INTRAVENOUS
  Filled 2021-06-21 (×2): qty 250

## 2021-06-21 MED ORDER — VITAL AF 1.2 CAL PO LIQD
1000.0000 mL | ORAL | Status: DC
  Administered 2021-06-21 – 2021-06-22 (×2): 1000 mL

## 2021-06-21 MED ORDER — ATROPINE SULFATE 1 MG/ML IV SOLN
1.0000 mg | Freq: Once | INTRAVENOUS | Status: AC
  Administered 2021-06-21: 1 mg via INTRAVENOUS
  Filled 2021-06-21: qty 1

## 2021-06-21 MED ORDER — GADOBUTROL 1 MMOL/ML IV SOLN
10.0000 mL | Freq: Once | INTRAVENOUS | Status: AC | PRN
  Administered 2021-06-21: 10 mL via INTRAVENOUS

## 2021-06-21 MED ORDER — PERFLUTREN LIPID MICROSPHERE
1.0000 mL | INTRAVENOUS | Status: AC | PRN
Start: 2021-06-21 — End: 2021-06-21
  Administered 2021-06-21: 2 mL via INTRAVENOUS
  Filled 2021-06-21: qty 10

## 2021-06-21 MED ORDER — POTASSIUM CHLORIDE 20 MEQ PO PACK
40.0000 meq | PACK | Freq: Once | ORAL | Status: AC
  Administered 2021-06-21: 40 meq
  Filled 2021-06-21: qty 2

## 2021-06-21 MED ORDER — FREE WATER
30.0000 mL | Status: DC
  Administered 2021-06-21 – 2021-06-22 (×6): 30 mL

## 2021-06-21 NOTE — Progress Notes (Signed)
Pt follows commands this shift. VSS. PRN versed and fentanyl boluses for agitation and pt trying to pull at tube. HR elevated to 150s sustaining ST during WUA, resolved to NSR with prn sedatives. Q2h turns. Foley intact adequate output. Will continue to monitor.

## 2021-06-21 NOTE — Progress Notes (Signed)
Eeg done 

## 2021-06-21 NOTE — Progress Notes (Signed)
*  PRELIMINARY RESULTS* Echocardiogram 2D Echocardiogram has been performed.  Lauren Velazquez 06/21/2021, 8:58 AM

## 2021-06-21 NOTE — Consult Note (Signed)
[facility] Face-to-Face Psychiatry Consult   Reason for Consult: Consult for 60 year old woman who presented to the emergency room a couple days ago acutely agitated with multiple symptoms unclear diagnosis Referring Physician: Archie Balboa Patient Identification: Lauren Velazquez MRN:  [id number] Principal Diagnosis: Acute delirium Diagnosis:  Principal Problem:   Acute delirium Active Problems:   Diabetes mellitus type 2, uncomplicated ([place])   Hyperlipidemia   Hypertension   Obesity, Class III, BMI 40-49.9 (morbid obesity) ([employer])   Altered mental status   Insulin dependent type 2 diabetes mellitus ([place])   Acute sepsis ([place])   Total Time spent with patient: 1 hour  Subjective:   Lauren Velazquez is a 60 y.o. female patient admitted with patient currently is intubated and sedated in the intensive care unit unable to give history.  HPI: Patient currently unable to give history.  I attempted to call the husband on the telephone but had to leave a voicemail message.  Looking back at the notes the patient presented to the emergency room by EMS who reported that they were called to the home for multiple different complaints.  Several times it is mentioned that the patient complained of pain all over her body.  Also it appears from the notes that multiple times the patient mentioned her narcotic pain medication and requested narcotic pain medication.  Apparently when she first came to the ER she was mobile enough that she was able to flip over a chair pace around and tried to stormed out of the ER.  Patient was sedated for safety at that time and has been sedated and now mechanically ventilated since.  Laboratory work-up so far has been nonspecific and nonrevealing.  Secondhand reports from the husband of that the patient has had periods of irritability before but there is no documentation I can see of anything this severe.  Past Psychiatric History: Nothing in the documentation suggests any past psychiatric  illness.  Anxiety is listed as a problem on the list but I do not see any mental health notes.  I do not see any notes in the chart that suggest concern about misuse of narcotics.  Risk to Self:   Risk to Others:   Prior Inpatient Therapy:   Prior Outpatient Therapy:    Past Medical History:  Past Medical History:  Diagnosis Date   Anemia    not currently under treatment   Anxiety    Asthma    during allergy season   Diabetes mellitus without complication ([place])    Hypertension    Vertigo     Past Surgical History:  Procedure Laterality Date   CARPAL TUNNEL RELEASE Right 2015   KNEE ARTHROSCOPY Left 05/17/2018   Procedure: ARTHROSCOPY KNEE WITH LATERAL RELEASE, PARTIAL SYNOVECTOMY;  Surgeon: Hessie Knows, MD;  Location: [facility] ORS;  Service: Orthopedics;  Laterality: Left;   KNEE ARTHROSCOPY WITH LATERAL RELEASE Right 12/28/2017   Procedure: KNEE ARTHROSCOPY WITH LATERAL RELEASE;  Surgeon: Hessie Knows, MD;  Location: [facility] ORS;  Service: Orthopedics;  Laterality: Right;   TIBIAL TUBERCLERPLASTY Bilateral 06/01/2017   Procedure: TIBIAL TUBERCLE SPUR EXCISION;  Surgeon: Hessie Knows, MD;  Location: [facility] ORS;  Service: Orthopedics;  Laterality: Bilateral;   Family History: History reviewed. No pertinent family history. Family Psychiatric  History: Unknown Social History:  Social History   Substance and Sexual Activity  Alcohol Use No     Social History   Substance and Sexual Activity  Drug Use No    Social History   Socioeconomic History  Marital status: Married    Spouse name: Not on file   Number of children: Not on file   Years of education: Not on file   Highest education level: Not on file  Occupational History   Not on file  Tobacco Use   Smoking status: Never   Smokeless tobacco: Never  Vaping Use   Vaping Use: Never used  Substance and Sexual Activity   Alcohol use: No   Drug use: No   Sexual activity: Not on file  Other Topics Concern   Not on file   Social History Narrative   Not on file   Social Determinants of Health   Financial Resource Strain: Not on file  Food Insecurity: Not on file  Transportation Needs: Not on file  Physical Activity: Not on file  Stress: Not on file  Social Connections: Not on file   Additional Social History:    Allergies:   Allergies  Allergen Reactions   Atorvastatin Swelling    Labs:  Results for orders placed or performed during the hospital encounter of 06/19/21 (from the past 48 hour(s))  Resp Panel by RT-PCR (Flu A&B, Covid) Nasopharyngeal Swab     Status: None   Collection Time: 06/19/21 10:57 PM   Specimen: Nasopharyngeal Swab; Nasopharyngeal(NP) swabs in vial transport medium  Result Value Ref Range   SARS Coronavirus 2 by RT PCR NEGATIVE NEGATIVE    Comment: (NOTE) SARS-CoV-2 target nucleic acids are NOT DETECTED.  The SARS-CoV-2 RNA is generally detectable in upper respiratory specimens during the acute phase of infection. The lowest concentration of SARS-CoV-2 viral copies this assay can detect is 138 copies/mL. A negative result does not preclude SARS-Cov-2 infection and should not be used as the sole basis for treatment or other patient management decisions. A negative result may occur with  improper specimen collection/handling, submission of specimen other than nasopharyngeal swab, presence of viral mutation(s) within the areas targeted by this assay, and inadequate number of viral copies(<138 copies/mL). A negative result must be combined with clinical observations, patient history, and epidemiological information. The expected result is Negative.  Fact Sheet for Patients:  EntrepreneurPulse.com.au  Fact Sheet for Healthcare Providers:  IncredibleEmployment.be  This test is no t yet approved or cleared by the [place] FDA and  has been authorized for detection and/or diagnosis of SARS-CoV-2 by FDA under an Emergency Use  Authorization (EUA). This EUA will remain  in effect (meaning this test can be used) for the duration of the COVID-19 declaration under Section 564(b)(1) of the Act, 21 U.S.C.section 360bbb-3(b)(1), unless the authorization is terminated  or revoked sooner.       Influenza A by PCR NEGATIVE NEGATIVE   Influenza B by PCR NEGATIVE NEGATIVE    Comment: (NOTE) The Xpert Xpress SARS-CoV-2/FLU/RSV plus assay is intended as an aid in the diagnosis of influenza from Nasopharyngeal swab specimens and should not be used as a sole basis for treatment. Nasal washings and aspirates are unacceptable for Xpert Xpress SARS-CoV-2/FLU/RSV testing.  Fact Sheet for Patients: EntrepreneurPulse.com.au  Fact Sheet for Healthcare Providers: IncredibleEmployment.be  This test is not yet approved or cleared by the [place] FDA and has been authorized for detection and/or diagnosis of SARS-CoV-2 by FDA under an Emergency Use Authorization (EUA). This EUA will remain in effect (meaning this test can be used) for the duration of the COVID-19 declaration under Section 564(b)(1) of the Act, 21 U.S.C. section 360bbb-3(b)(1), unless the authorization is terminated or revoked.  Performed at [facility], [place], [place] [address]   Troponin I (High Sensitivity)     Status: None   Collection Time: 06/19/21 10:57 PM  Result Value Ref Range   Troponin I (High Sensitivity) 15 <18 ng/L    Comment: (NOTE) Elevated high sensitivity troponin I (hsTnI) values and significant  changes across serial measurements may suggest ACS but many other  chronic and acute conditions are known to elevate hsTnI results.  Refer to the "Links" section for chest pain algorithms and additional  guidance. Performed at [facility], [address] [place], [place] [address]   Urinalysis, Complete w Microscopic Nasopharyngeal Swab     Status: Abnormal    Collection Time: 06/19/21 10:57 PM  Result Value Ref Range   Color, Urine YELLOW (A) YELLOW   APPearance CLEAR (A) CLEAR   Specific Gravity, Urine 1.024 1.005 - 1.030   pH 6.0 5.0 - 8.0   Glucose, UA >=500 (A) NEGATIVE mg/dL   Hgb urine dipstick SMALL (A) NEGATIVE   Bilirubin Urine NEGATIVE NEGATIVE   Ketones, ur 80 (A) NEGATIVE mg/dL   Protein, ur 30 (A) NEGATIVE mg/dL   Nitrite NEGATIVE NEGATIVE   Leukocytes,Ua NEGATIVE NEGATIVE   RBC / HPF 0-5 0 - 5 RBC/hpf   WBC, UA 0-5 0 - 5 WBC/hpf   Bacteria, UA NONE SEEN NONE SEEN   Squamous Epithelial / LPF 0-5 0 - 5    Comment: Performed at [facility], [address], [place], [place] [address]  Urine Drug Screen, Qualitative ([facility] only)     Status: None   Collection Time: 06/19/21 10:57 PM  Result Value Ref Range   Tricyclic, Ur Screen NONE DETECTED NONE DETECTED   Amphetamines, Ur Screen NONE DETECTED NONE DETECTED   MDMA (Ecstasy)Ur Screen NONE DETECTED NONE DETECTED   Cocaine Metabolite,Ur [place] NONE DETECTED NONE DETECTED   Opiate, Ur Screen NONE DETECTED NONE DETECTED   Phencyclidine (PCP) Ur S NONE DETECTED NONE DETECTED   Cannabinoid 50 Ng, Ur [place] NONE DETECTED NONE DETECTED   Barbiturates, Ur Screen NONE DETECTED NONE DETECTED   Benzodiazepine, Ur Scrn NONE DETECTED NONE DETECTED   Methadone Scn, Ur NONE DETECTED NONE DETECTED    Comment: (NOTE) Tricyclics + metabolites, urine    Cutoff 1000 ng/mL Amphetamines + metabolites, urine  Cutoff 1000 ng/mL MDMA (Ecstasy), urine              Cutoff 500 ng/mL Cocaine Metabolite, urine          Cutoff 300 ng/mL Opiate + metabolites, urine        Cutoff 300 ng/mL Phencyclidine (PCP), urine         Cutoff 25 ng/mL Cannabinoid, urine                 Cutoff 50 ng/mL Barbiturates + metabolites, urine  Cutoff 200 ng/mL Benzodiazepine, urine              Cutoff 200 ng/mL Methadone, urine                   Cutoff 300 ng/mL  The urine drug screen provides only a preliminary,  unconfirmed analytical test result and should not be used for non-medical purposes. Clinical consideration and professional judgment should be applied to any positive drug screen result due to possible interfering substances. A more specific alternate chemical method must be used in order to obtain a confirmed analytical result. Gas chromatography / mass spectrometry (GC/MS) is the  preferred confirm atory method. Performed at [facility], [address] [place], [place] [address]   Lactic acid, plasma     Status: Abnormal   Collection Time: 06/19/21 10:57 PM  Result Value Ref Range   Lactic Acid, Venous 4.5 (HH) 0.5 - 1.9 mmol/L    Comment: CRITICAL RESULT CALLED TO, READ BACK BY AND VERIFIED WITH SILVIA URGILES 0005 06/20/2021 DLB Performed at [facility], [address] [place], [place] [address]   Procalcitonin - Baseline     Status: None   Collection Time: 06/19/21 10:57 PM  Result Value Ref Range   Procalcitonin <0.10 ng/mL    Comment:        Interpretation: PCT (Procalcitonin) <= 0.5 ng/mL: Systemic infection (sepsis) is not likely. Local bacterial infection is possible. (NOTE)       Sepsis PCT Algorithm           Lower Respiratory Tract                                      Infection PCT Algorithm    ----------------------------     ----------------------------         PCT < 0.25 ng/mL                PCT < 0.10 ng/mL          Strongly encourage             Strongly discourage   discontinuation of antibiotics    initiation of antibiotics    ----------------------------     -----------------------------       PCT 0.25 - 0.50 ng/mL            PCT 0.10 - 0.25 ng/mL               OR       >80% decrease in PCT            Discourage initiation of                                            antibiotics      Encourage discontinuation           of antibiotics    ----------------------------     -----------------------------         PCT >= 0.50 ng/mL               PCT 0.26 - 0.50 ng/mL               AND        <80% decrease in PCT             Encourage initiation of                                             antibiotics       Encourage continuation           of antibiotics    ----------------------------     -----------------------------        PCT >= 0.50 ng/mL                  PCT > 0.50  ng/mL               AND         increase in PCT                  Strongly encourage                                      initiation of antibiotics    Strongly encourage escalation           of antibiotics                                     -----------------------------                                           PCT <= 0.25 ng/mL                                                 OR                                        > 80% decrease in PCT                                      Discontinue / Do not initiate                                             antibiotics  Performed at [facility], [address] [place], [place] [address]   Hepatic function panel     Status: None   Collection Time: 06/19/21 10:57 PM  Result Value Ref Range   Total Protein 7.3 6.5 - 8.1 g/dL   Albumin 3.6 3.5 - 5.0 g/dL   AST 24 15 - 41 U/L   ALT 13 0 - 44 U/L   Alkaline Phosphatase 90 38 - 126 U/L   Total Bilirubin 0.7 0.3 - 1.2 mg/dL   Bilirubin, Direct <0.1 0.0 - 0.2 mg/dL   Indirect Bilirubin NOT CALCULATED 0.3 - 0.9 mg/dL    Comment: Performed at [facility], [address] [place], [place] [address]  Blood culture (routine x 2)     Status: None (Preliminary result)   Collection Time: 06/19/21 11:05 PM   Specimen: BLOOD  Result Value Ref Range   Specimen Description BLOOD BLOOD LEFT HAND    Special Requests      BOTTLES DRAWN AEROBIC AND ANAEROBIC Blood Culture adequate volume   Culture      NO GROWTH 2 DAYS Performed at [facility], [address] [place], [place] [address]    Report Status PENDING   Lactic acid, plasma      Status: Abnormal   Collection Time: 06/20/21 12:52 AM  Result Value Ref Range  Lactic Acid, Venous 3.4 (HH) 0.5 - 1.9 mmol/L    Comment: CRITICAL VALUE NOTED. VALUE IS CONSISTENT WITH PREVIOUSLY REPORTED/CALLED VALUE DLB Performed at [facility], [address] [place], [place] [address]   CBG monitoring, ED     Status: Abnormal   Collection Time: 06/20/21  1:17 AM  Result Value Ref Range   Glucose-Capillary 325 (H) 70 - 99 mg/dL    Comment: Glucose reference range applies only to samples taken after fasting for at least 8 hours.  Blood culture (routine x 2)     Status: None (Preliminary result)   Collection Time: 06/20/21  1:30 AM   Specimen: BLOOD  Result Value Ref Range   Specimen Description BLOOD RIGHT ANTECUBITAL    Special Requests      BOTTLES DRAWN AEROBIC AND ANAEROBIC Blood Culture results may not be optimal due to an excessive volume of blood received in culture bottles   Culture      NO GROWTH 1 DAY Performed at [facility], [address], [place], [place] [address]    Report Status PENDING   HIV Antibody (routine testing w rflx)     Status: None   Collection Time: 06/20/21  1:35 AM  Result Value Ref Range   HIV Screen 4th Generation wRfx Non Reactive Non Reactive    Comment: Performed at [facility], [place] [address] [place], [place] [address]  Protime-INR     Status: None   Collection Time: 06/20/21  1:35 AM  Result Value Ref Range   Prothrombin Time 14.4 11.4 - 15.2 seconds   INR 1.1 0.8 - 1.2    Comment: (NOTE) INR goal varies based on device and disease states. Performed at [facility], [address] [place], [place] [address]   Ammonia     Status: None   Collection Time: 06/20/21  1:35 AM  Result Value Ref Range   Ammonia 35 9 - 35 umol/L    Comment: HEMOLYSIS AT THIS LEVEL MAY AFFECT RESULT Performed at [facility], [address] [place], [place] [address]   Vitamin B12     Status: None    Collection Time: 06/20/21  1:35 AM  Result Value Ref Range   Vitamin B-12 202 180 - 914 pg/mL    Comment: (NOTE) This assay is not validated for testing neonatal or myeloproliferative syndrome specimens for Vitamin B12 levels. Performed at [facility], [place] [address], [place], [place] [address]   Magnesium     Status: None   Collection Time: 06/20/21  1:35 AM  Result Value Ref Range   Magnesium 2.0 1.7 - 2.4 mg/dL    Comment: Performed at [facility], [address] [place], [place] [address]  Phosphorus     Status: Abnormal   Collection Time: 06/20/21  1:35 AM  Result Value Ref Range   Phosphorus 1.2 (L) 2.5 - 4.6 mg/dL    Comment: HEMOLYSIS AT THIS LEVEL MAY AFFECT RESULT Performed at [facility], [address] [place], [place] [address]   TSH     Status: Abnormal   Collection Time: 06/20/21  1:35 AM  Result Value Ref Range   TSH 0.221 (L) 0.350 - 4.500 uIU/mL    Comment: Performed by a 3rd Generation assay with a functional sensitivity of <=0.01 uIU/mL. Performed at [facility], [address], [place], [place] [address]   MRSA Next Gen by PCR, Nasal     Status: None  Collection Time: 06/20/21  1:35 AM   Specimen: Nasal Mucosa; Nasal Swab  Result Value Ref Range   MRSA by PCR Next Gen NOT DETECTED NOT DETECTED    Comment: (NOTE) The GeneXpert MRSA Assay (FDA approved for NASAL specimens only), is one component of a comprehensive MRSA colonization surveillance program. It is not intended to diagnose MRSA infection nor to guide or monitor treatment for MRSA infections. Test performance is not FDA approved in patients less than 23 years old. Performed at [facility], [address] [place], [place] [address]   CK     Status: Abnormal   Collection Time: 06/20/21  1:35 AM  Result Value Ref Range   Total CK 393 (H) 38 - 234 U/L    Comment: HEMOLYSIS AT THIS LEVEL MAY AFFECT RESULT Performed at [facility],  [address] [place], [place] [address]   Blood gas, arterial     Status: Abnormal   Collection Time: 06/20/21  2:04 AM  Result Value Ref Range   FIO2 0.60    Delivery systems VENTILATOR    Mode ASSIST CONTROL    VT 450 mL   LHR 18 resp/min   Peep/cpap 5.0 cm H20   pH, Arterial 7.46 (H) 7.350 - 7.450   pCO2 arterial 32 32.0 - 48.0 mmHg   pO2, Arterial 177 (H) 83.0 - 108.0 mmHg   Bicarbonate 22.8 20.0 - 28.0 mmol/L   Acid-base deficit 0.5 0.0 - 2.0 mmol/L   O2 Saturation 99.6 %   Patient temperature 37.0    Collection site RIGHT RADIAL    Sample type ARTERIAL DRAW    Allens test (pass/fail) PASS PASS    Comment: Performed at [facility], [address] [place], [place] [address]  CSF cell count with differential collection tube #: 1     Status: Abnormal   Collection Time: 06/20/21  2:37 AM  Result Value Ref Range   Tube # 1    Color, CSF COLORLESS COLORLESS   Appearance, CSF CLEAR CLEAR   RBC Count, CSF 219 (H) 0 - 3 /cu mm   WBC, CSF 8 (H) 0 - 5 /cu mm   Segmented Neutrophils-CSF 10 %   Lymphs, CSF 20 %   Monocyte-Macrophage-Spinal Fluid 70 %    Comment: Performed at [facility], [address] [place], [place] [address]  CSF culture w Gram Stain     Status: None (Preliminary result)   Collection Time: 06/20/21  2:37 AM   Specimen: CSF; Cerebrospinal Fluid  Result Value Ref Range   Specimen Description      CSF Performed at [facility], [address] [place], [place] [address]    Special Requests      NONE Performed at [facility], [address], [place], [place] [address]    Gram Stain      NO ORGANISMS SEEN WBC SEEN RED BLOOD CELLS PRESENT Performed at [facility], [address], [place], [place] [address]    Culture      NO GROWTH 1 DAY Performed at [place] [address] [place], [place] [address]    Report Status PENDING   Protein and glucose, CSF     Status: Abnormal    Collection Time: 06/20/21  2:37 AM  Result Value Ref Range   Glucose, CSF 204 (H) 40 - 70 mg/dL   Total  Protein, CSF 45 15 - 45 mg/dL    Comment: Performed  at [facility], [address] [place], [place] [address]  CSF cell count with differential collection tube #: 4     Status: Abnormal   Collection Time: 06/20/21  2:37 AM  Result Value Ref Range   Tube # 4    Color, CSF COLORLESS COLORLESS   Appearance, CSF CLEAR CLEAR   RBC Count, CSF 317 (H) 0 - 3 /cu mm   WBC, CSF 22 (HH) 0 - 5 /cu mm    Comment: CRITICAL RESULT CALLED TO, READ BACK BY AND VERIFIED WITH: Iran Ouch RN NOTIFIED AT 0423 06/20/21 GAA    Segmented Neutrophils-CSF 9 %   Lymphs, CSF 23 %   Monocyte-Macrophage-Spinal Fluid 68 %    Comment: Performed at [facility], [address], [place], [place] [address]  Cryptococcal antigen, CSF     Status: None   Collection Time: 06/20/21  2:37 AM  Result Value Ref Range   Crypto Ag NEGATIVE NEGATIVE   Cryptococcal Ag Titer NOT INDICATED NOT INDICATED    Comment: Performed at [facility], [place] [address] [place], [place] [address]  Glucose, capillary     Status: Abnormal   Collection Time: 06/20/21  4:10 AM  Result Value Ref Range   Glucose-Capillary 364 (H) 70 - 99 mg/dL    Comment: Glucose reference range applies only to samples taken after fasting for at least 8 hours.   Comment 1 Notify RN    Comment 2 Document in Chart   CBC     Status: Abnormal   Collection Time: 06/20/21  5:00 AM  Result Value Ref Range   WBC 13.9 (H) 4.0 - 10.5 K/uL   RBC 4.16 3.87 - 5.11 MIL/uL   Hemoglobin 10.4 (L) 12.0 - 15.0 g/dL   HCT 34.0 (L) 36.0 - 46.0 %   MCV 81.7 80.0 - 100.0 fL   MCH 25.0 (L) 26.0 - 34.0 pg   MCHC 30.6 30.0 - 36.0 g/dL   RDW 15.0 11.5 - 15.5 %   Platelets 419 (H) 150 - 400 K/uL   nRBC 0.0 0.0 - 0.2 %    Comment: Performed at [facility], [address], [place], [place] [address]  Magnesium     Status: None    Collection Time: 06/20/21  5:00 AM  Result Value Ref Range   Magnesium 1.7 1.7 - 2.4 mg/dL    Comment: Performed at [facility], [address] [place], [place] [address]  Phosphorus     Status: None   Collection Time: 06/20/21  5:00 AM  Result Value Ref Range   Phosphorus 3.3 2.5 - 4.6 mg/dL    Comment: Performed at [facility], [address] [place], [place] [address]  T4, free     Status: None   Collection Time: 06/20/21  5:00 AM  Result Value Ref Range   Free T4 0.90 0.61 - 1.12 ng/dL    Comment: (NOTE) Biotin ingestion may interfere with free T4 tests. If the results are inconsistent with the TSH level, previous test results, or the clinical presentation, then consider biotin interference. If needed, order repeat testing after stopping biotin. Performed at [facility], [address] [place],  [address]   CK     Status: Abnormal   Collection Time: 06/20/21  5:00 AM  Result Value Ref Range   Total CK 473 (H) 38 - 234 U/L    Comment: Performed at [facility], [place],  [employer], [place] [address]  Hemoglobin A1c     Status: Abnormal   Collection Time: 06/20/21  5:00 AM  Result Value Ref Range   Hgb A1c MFr Bld 8.2 (H) 4.8 - 5.6 %    Comment: (NOTE) Pre diabetes:          5.7%-6.4%  Diabetes:              >6.4%  Glycemic control for   <7.0% adults with diabetes    Mean Plasma Glucose 188.64 mg/dL    Comment: Performed at [facility] [address], [place], [place] [address]  Basic metabolic panel     Status: Abnormal   Collection Time: 06/20/21  7:33 AM  Result Value Ref Range   Sodium 136 135 - 145 mmol/L   Potassium 4.2 3.5 - 5.1 mmol/L   Chloride 102 98 - 111 mmol/L   CO2 25 22 - 32 mmol/L   Glucose, Bld 269 (H) 70 - 99 mg/dL    Comment: Glucose reference range applies only to samples taken after fasting for at least 8 hours.   BUN 7 6 - 20 mg/dL   Creatinine, Ser 0.69 0.44 - 1.00 mg/dL    Calcium 8.5 (L) 8.9 - 10.3 mg/dL   GFR, Estimated >60 >60 mL/min    Comment: (NOTE) Calculated using the CKD-EPI Creatinine Equation (2021)    Anion gap 9 5 - 15    Comment: Performed at [facility], [address] [place], [place] [address]  Lactic acid, plasma     Status: Abnormal   Collection Time: 06/20/21  7:33 AM  Result Value Ref Range   Lactic Acid, Venous 3.9 (HH) 0.5 - 1.9 mmol/L    Comment: CRITICAL VALUE NOTED. VALUE IS CONSISTENT WITH PREVIOUSLY REPORTED/CALLED VALUE MJU Performed at [facility], [address] [place], [place] [address]   Glucose, capillary     Status: Abnormal   Collection Time: 06/20/21  7:46 AM  Result Value Ref Range   Glucose-Capillary 224 (H) 70 - 99 mg/dL    Comment: Glucose reference range applies only to samples taken after fasting for at least 8 hours.  Glucose, capillary     Status: Abnormal   Collection Time: 06/20/21 11:18 AM  Result Value Ref Range   Glucose-Capillary 237 (H) 70 - 99 mg/dL    Comment: Glucose reference range applies only to samples taken after fasting for at least 8 hours.  Glucose, capillary     Status: Abnormal   Collection Time: 06/20/21  4:18 PM  Result Value Ref Range   Glucose-Capillary 285 (H) 70 - 99 mg/dL    Comment: Glucose reference range applies only to samples taken after fasting for at least 8 hours.  Lactic acid, plasma     Status: None   Collection Time: 06/20/21  6:31 PM  Result Value Ref Range   Lactic Acid, Venous 1.4 0.5 - 1.9 mmol/L    Comment: Performed at [facility], [address] [place], [place] [address]  Glucose, capillary     Status: Abnormal   Collection Time: 06/20/21  8:07 PM  Result Value Ref Range   Glucose-Capillary 222 (H) 70 - 99 mg/dL    Comment: Glucose reference range applies only to samples taken after fasting for at least 8 hours.  Glucose, capillary     Status: Abnormal   Collection Time: 06/20/21 11:43 PM  Result Value Ref Range    Glucose-Capillary 187 (H) 70 - 99  mg/dL    Comment: Glucose reference range applies only to samples taken after fasting for at least 8 hours.  Glucose, capillary     Status: Abnormal   Collection Time: 06/21/21  4:27 AM  Result Value Ref Range   Glucose-Capillary 146 (H) 70 - 99 mg/dL    Comment: Glucose reference range applies only to samples taken after fasting for at least 8 hours.  Basic metabolic panel     Status: Abnormal   Collection Time: 06/21/21  5:24 AM  Result Value Ref Range   Sodium 140 135 - 145 mmol/L   Potassium 3.2 (L) 3.5 - 5.1 mmol/L   Chloride 104 98 - 111 mmol/L   CO2 27 22 - 32 mmol/L   Glucose, Bld 152 (H) 70 - 99 mg/dL    Comment: Glucose reference range applies only to samples taken after fasting for at least 8 hours.   BUN 8 6 - 20 mg/dL   Creatinine, Ser 0.56 0.44 - 1.00 mg/dL   Calcium 8.7 (L) 8.9 - 10.3 mg/dL   GFR, Estimated >60 >60 mL/min    Comment: (NOTE) Calculated using the CKD-EPI Creatinine Equation (2021)    Anion gap 9 5 - 15    Comment: Performed at [facility], [address] [place], [place] [address]  Triglycerides     Status: Abnormal   Collection Time: 06/21/21  5:24 AM  Result Value Ref Range   Triglycerides 298 (H) <150 mg/dL    Comment: Performed at [facility], [address] [place], [place] [address]  Hepatic function panel     Status: Abnormal   Collection Time: 06/21/21  5:24 AM  Result Value Ref Range   Total Protein 6.3 (L) 6.5 - 8.1 g/dL   Albumin 2.9 (L) 3.5 - 5.0 g/dL   AST 18 15 - 41 U/L   ALT 13 0 - 44 U/L   Alkaline Phosphatase 73 38 - 126 U/L   Total Bilirubin 0.4 0.3 - 1.2 mg/dL   Bilirubin, Direct <0.1 0.0 - 0.2 mg/dL   Indirect Bilirubin NOT CALCULATED 0.3 - 0.9 mg/dL    Comment: Performed at [facility], [address] [place], [place] [address]  Magnesium     Status: None   Collection Time: 06/21/21  5:24 AM  Result Value Ref Range   Magnesium 2.0 1.7 - 2.4 mg/dL     Comment: Performed at [facility], [address] [place], [place] [address]  Phosphorus     Status: None   Collection Time: 06/21/21  5:24 AM  Result Value Ref Range   Phosphorus 3.0 2.5 - 4.6 mg/dL    Comment: Performed at [facility], [address] [place], [place] [address]  CK     Status: None   Collection Time: 06/21/21  5:24 AM  Result Value Ref Range   Total CK 210 38 - 234 U/L    Comment: Performed at [facility], [address] [place], [place] [address]  Glucose, capillary     Status: Abnormal   Collection Time: 06/21/21  7:51 AM  Result Value Ref Range   Glucose-Capillary 210 (H) 70 - 99 mg/dL    Comment: Glucose reference range applies only to samples taken after fasting for at least 8 hours.  Glucose, capillary     Status: Abnormal   Collection Time: 06/21/21 11:14 AM  Result Value Ref Range   Glucose-Capillary 167 (H) 70 - 99 mg/dL  Comment: Glucose reference range applies only to samples taken after fasting for at least 8 hours.  Glucose, capillary     Status: Abnormal   Collection Time: 06/21/21  3:37 PM  Result Value Ref Range   Glucose-Capillary 218 (H) 70 - 99 mg/dL    Comment: Glucose reference range applies only to samples taken after fasting for at least 8 hours.  CBC     Status: Abnormal   Collection Time: 06/21/21  4:47 PM  Result Value Ref Range   WBC 15.0 (H) 4.0 - 10.5 K/uL   RBC 4.05 3.87 - 5.11 MIL/uL   Hemoglobin 9.9 (L) 12.0 - 15.0 g/dL   HCT 32.9 (L) 36.0 - 46.0 %   MCV 81.2 80.0 - 100.0 fL   MCH 24.4 (L) 26.0 - 34.0 pg   MCHC 30.1 30.0 - 36.0 g/dL   RDW 15.2 11.5 - 15.5 %   Platelets 423 (H) 150 - 400 K/uL   nRBC 0.0 0.0 - 0.2 %    Comment: Performed at [facility], [address], [place], [place] [address]    Current Facility-Administered Medications  Medication Dose Route Frequency Provider Last Rate Last Admin   0.9 %  sodium chloride infusion   Intravenous PRN Flora Lipps, MD    Stopped at 06/21/21 1536   acyclovir (ZOVIRAX) 720 mg in dextrose 5 % 150 mL IVPB  10 mg/kg (Adjusted) Intravenous Q8H Cox, Amy N, DO   Stopped at 06/21/21 1518   albuterol (PROVENTIL) (2.5 MG/3ML) 0.083% nebulizer solution 2.5 mg  2.5 mg Nebulization Q4H PRN Rust-Chester, Toribio Harbour L, NP       ampicillin (OMNIPEN) 2 g in sodium chloride 0.9 % 100 mL IVPB  2 g Intravenous Q4H Rust-Chester, Britton L, NP 300 mL/hr at 06/21/21 1544 Infusion Verify at 06/21/21 1544   arformoterol (BROVANA) nebulizer solution 15 mcg  15 mcg Nebulization BID Rust-Chester, Britton L, NP   15 mcg at 06/21/21 0757   budesonide (PULMICORT) nebulizer solution 0.25 mg  0.25 mg Nebulization BID Rust-Chester, Britton L, NP   0.25 mg at 06/21/21 0757   cefTRIAXone (ROCEPHIN) 2 g in sodium chloride 0.9 % 100 mL IVPB  2 g Intravenous Q12H Rust-Chester, Britton L, NP   Stopped at 06/21/21 1144   chlorhexidine gluconate (MEDLINE KIT) (PERIDEX) 0.12 % solution 15 mL  15 mL Mouth Rinse BID Flora Lipps, MD   15 mL at 06/21/21 0828   Chlorhexidine Gluconate Cloth 2 % PADS 6 each  6 each Topical X4503 Flora Lipps, MD   6 each at 06/21/21 1000   dexmedetomidine (PRECEDEX) 400 MCG/100ML (4 mcg/mL) infusion  0.4-1.2 mcg/kg/hr Intravenous Titrated Tyler Pita, MD 23.2 mL/hr at 06/21/21 1544 0.9 mcg/kg/hr at 06/21/21 1544   diclofenac Sodium (VOLTAREN) 1 % topical gel 2 g  2 g Topical BID PRN Rust-Chester, Huel Cote, NP       docusate (COLACE) 50 MG/5ML liquid 100 mg  100 mg Per Tube BID Rust-Chester, Britton L, NP   100 mg at 06/21/21 0813   enoxaparin (LOVENOX) injection 52.5 mg  0.5 mg/kg Subcutaneous Q24H Rust-Chester, Britton L, NP   52.5 mg at 06/21/21 0816   famotidine (PEPCID) IVPB 20 mg premix  20 mg Intravenous Q12H Rust-Chester, Huel Cote, NP   Stopped at 06/21/21 1232   feeding supplement (VITAL AF 1.2 CAL) liquid 1,000 mL  1,000 mL Per Tube Continuous Tyler Pita, MD 25 mL/hr at 06/21/21 1428 1,000 mL at 06/21/21 1428  fentaNYL (SUBLIMAZE) bolus via infusion 50-100 mcg  50-100 mcg Intravenous Q2H PRN Rust-Chester, Toribio Harbour L, NP   100 mcg at 06/21/21 1645   fentaNYL 2596mg in NS 2545m(1075mml) infusion-PREMIX  0-200 mcg/hr Intravenous Continuous Rust-Chester, Britton L, NP 20 mL/hr at 06/21/21 1544 200 mcg/hr at 06/21/21 1544   free water 30 mL  30 mL Per Tube Q4H GonTyler PitaD   30 mL at 06/21/21 1428   gabapentin (NEURONTIN) capsule 600 mg  600 mg Per Tube BID Rust-Chester, BriHuel CoteP   600 mg at 06/21/21 0814   haloperidol lactate (HALDOL) injection 2 mg  2 mg Intramuscular Q6H PRN Cox, Amy N, DO       hydrALAZINE (APRESOLINE) injection 10-20 mg  10-20 mg Intravenous Q4H PRN Rust-Chester, BriToribio Harbour NP       ibuprofen (ADVIL) tablet 800 mg  800 mg Oral Q6H PRN Rust-Chester, BriToribio Harbour NP       insulin aspart (novoLOG) injection 0-20 Units  0-20 Units Subcutaneous Q4H Rust-Chester, Britton L, NP   7 Units at 06/21/21 1554   insulin glargine-yfgn (SEMGLEE) injection 40 Units  40 Units Subcutaneous BID Cox, Amy N, DO   40 Units at 06/21/21 0813149MEDLINE mouth rinse  15 mL Mouth Rinse 10 times per day KasFlora LippsD   15 mL at 06/21/21 1542   midazolam (VERSED) injection 2 mg  2 mg Intravenous Q1H PRN Rust-Chester, BriHuel CoteP   2 mg at 06/21/21 1223   norepinephrine (LEVOPHED) 4mg27m 250mL78mmix infusion  0-40 mcg/min Intravenous Titrated Rust-Chester, Britton L, NP 11.25 mL/hr at 06/21/21 1544 3 mcg/min at 06/21/21 1544   ondansetron (ZOFRAN) tablet 4 mg  4 mg Oral Q6H PRN Cox, Amy N, DO       Or   ondansetron (ZOFRAN) injection 4 mg  4 mg Intravenous Q6H PRN Cox, Amy N, DO       polyethylene glycol (MIRALAX / GLYCOLAX) packet 17 g  17 g Oral Daily PRN Rust-Chester, BrittToribio HarbourP       polyethylene glycol (MIRALAX / GLYCOLAX) packet 17 g  17 g Per Tube Daily Rust-Chester, Britton L, NP   17 g at 06/21/21 0814   propofol (DIPRIVAN) 1000 MG/100ML infusion  5-80 mcg/kg/min Intravenous  Continuous Ward, Kristen N, DO   Stopped at 06/21/21 1531   traZODone (DESYREL) tablet 200 mg  200 mg Per Tube QHS PRN Rust-Chester, BrittHuel Cote      vancomycin (VANCOREADY) IVPB 750 mg/150 mL  750 mg Intravenous Q12H BelueRenda Rolls   Stopped at 06/21/21 0920    Musculoskeletal: Strength & Muscle Tone: decreased Gait & Station: unable to stand Patient leans: N/A            Psychiatric Specialty Exam:  Presentation  General Appearance: No data recorded Eye Contact:No data recorded Speech:No data recorded Speech Volume:No data recorded Handedness:No data recorded  Mood and Affect  Mood:No data recorded Affect:No data recorded  Thought Process  Thought Processes:No data recorded Descriptions of Associations:No data recorded Orientation:No data recorded Thought Content:No data recorded History of Schizophrenia/Schizoaffective disorder:No data recorded Duration of Psychotic Symptoms:No data recorded Hallucinations:No data recorded Ideas of Reference:No data recorded Suicidal Thoughts:No data recorded Homicidal Thoughts:No data recorded  Sensorium  Memory:No data recorded Judgment:No data recorded Insight:No data recorded  Executive Functions  Concentration:No data recorded Attention Span:No data recorded Recall:No data recorded Fund of Knowledge:No data recorded Language:No data recorded  Psychomotor  Activity  Psychomotor Activity:No data recorded  Assets  Assets:No data recorded  Sleep  Sleep:No data recorded  Physical Exam: Physical Exam Vitals and nursing note reviewed.  Constitutional:      Appearance: She is ill-appearing.  HENT:     Head: Normocephalic and atraumatic.     Mouth/Throat:     Pharynx: Oropharynx is clear.  Eyes:     Pupils: Pupils are equal, round, and reactive to light.  Cardiovascular:     Rate and Rhythm: Normal rate and regular rhythm.  Pulmonary:     Comments: Patient is currently mechanically  ventilated Abdominal:     General: Abdomen is flat.     Palpations: Abdomen is soft.  Musculoskeletal:        General: Normal range of motion.  Skin:    General: Skin is warm and dry.  Neurological:     General: No focal deficit present.     Mental Status: Mental status is at baseline.  Psychiatric:        Attention and Perception: She is inattentive.        Speech: She is noncommunicative.   Review of Systems  Unable to perform ROS: Intubated  Blood pressure (!) 141/63, pulse 60, temperature 97.9 F (36.6 C), temperature source Esophageal, resp. rate 18, height _0  (1.549 m), weight 103.3 kg, last menstrual period 08/15/2008, SpO2 99 %. Body mass index is 43.03 kg/m.  Treatment Plan Summary: Plan patient currently sedated and intubated.  I spoke with the nurse on duty who reports that the patient has been requiring high doses of Precedex and fentanyl to keep her sedated and that she becomes agitated very easily if doses are reduced or she is disturbed.  So far no single diagnosis.  Judging just from the description in the emergency room notes it is possible the patient was having an acute mania or an acute emotional adjustment disorder to something.  I would however be very concerned about the possibility that this is all related to her narcotic pain medicine as well.  As I mentioned there is no chart evidence about misuse of narcotics but the patient did seem to be very focused on them when she presented as she is on a pretty high dose of 50 mg of oxycodone per day.  It is possible she could have overused medicines and then be in withdrawal.  In any case impossible to make a more definitive diagnosis until the patient wakes up and we get more history.  I will follow as needed.  Disposition:  See note  Alethia Berthold, MD 06/21/2021 6:19 PM

## 2021-06-21 NOTE — Progress Notes (Signed)
ID Pt remains intubated  On precedex, fentanyl and versed Low level levo BP (!) 137/57   Pulse 63   Temp 97.9 F (36.6 C) (Esophageal)   Resp 14   Ht 5\' 1"  (1.549 m)   Wt 103.3 kg   LMP 08/15/2008   SpO2 99%   BMI 43.03 kg/m    Afebrile 24 hrs Chest b/l air entry Hss1s2 Abd soft CNS cannot be assessed  Labs CBC Latest Ref Rng & Units 06/21/2021 06/20/2021 06/19/2021  WBC 4.0 - 10.5 K/uL 15.0(H) 13.9(H) 14.4(H)  Hemoglobin 12.0 - 15.0 g/dL 13/12/2020) 10.4(L) 12.2  Hematocrit 36.0 - 46.0 % 32.9(L) 34.0(L) 39.4  Platelets 150 - 400 K/uL 423(H) 419(H) 507(H)    CMP Latest Ref Rng & Units 06/21/2021 06/20/2021 06/19/2021  Glucose 70 - 99 mg/dL 13/12/2020) 253(G) 644(I)  BUN 6 - 20 mg/dL 8 7 9   Creatinine 0.44 - 1.00 mg/dL 347(Q 2.59  Sodium 135 - 145 mmol/L 140 136 134(L)  Potassium 3.5 - 5.1 mmol/L 3.2(L) 4.2 3.9  Chloride 98 - 111 mmol/L 104 102 97(L)  CO2 22 - 32 mmol/L 27 25 28   Calcium 8.9 - 10.3 mg/dL 5.63) 8.75) 9.4  Total Protein 6.5 - 8.1 g/dL 6.3(L) - 7.3  Total Bilirubin 0.3 - 1.2 mg/dL 0.4 - 0.7  Alkaline Phos 38 - 126 U/L 73 - 90  AST 15 - 41 U/L 18 - 24  ALT 0 - 44 U/L 13 - 13     Micro BC 06/19/21- NG 06/20/21 NG CSF 06/20/21 NG so far CSF cell count  22 ( 9% N) Protein 45 Glucose 204  MRI 1. Scattered punctate acute infarcts in both cerebral hemispheres (bilateral PCA and left MCA territories). No associated hemorrhage or mass effect. Although the posterior fossa and anterior cerebral artery territories are spared, the pattern is suspicious for a recent Embolic event. Hypotensive episode might also produce this appearance.   2. 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  Impression/recommendation  pt presenting with altered mental status and bizarre behavior- but when presenting to the ED she was awake but agitated, flipping recliner, screaming and shouting and asking for pain  medications. Had to be intubated for airway protection for LP Fever with AMS- CSF mild pleocytosis- more lymphocytic With no history available from her and not able to reach her husband unable to get a full picture. I tried to call him and her daughter with no response D.D This does not look like bacterial meningits .  Need to r/o HSV encephalitis ( but MRI does not show any temporal lobe enhancement) continue acyclovir and triple antibiotics  to cover bacteria ( including listeria ) until csf culture and PCR are resulted  Other condition- Drug induced - like NMS is in the D>D  Recommend EEG to look for PLEDS/seizure activity  MRI is abnormal suggestive of scattered punctate acute infarcts 2 d echo N  Anemia  DM- Hba1c 8.9  Discussed the management with her nurses

## 2021-06-21 NOTE — Progress Notes (Signed)
Initial Nutrition Assessment  DOCUMENTATION CODES:   Obesity unspecified  INTERVENTION:   Vital 1.2 @55ml /hr- Initiate at 21ml/hr and increase by 36ml/hr q 8 hours until goal rate is reached.   Free water flushes 80ml q4 hours to maintain tube patency   Regimen provides 1584kcal/day, 99g/day protein and 1226ml/day of free water  Pt at high refeed risk; recommend monitor potassium, magnesium and phosphorus labs daily until stable  NUTRITION DIAGNOSIS:   Inadequate oral intake related to inability to eat (pt sedated and ventilated) as evidenced by NPO status.  GOAL:   Provide needs based on ASPEN/SCCM guidelines  MONITOR:   Vent status, Labs, Weight trends, TF tolerance, Skin, I & O's  REASON FOR ASSESSMENT:   Consult Assessment of nutrition requirement/status  ASSESSMENT:   60 y/o female with h/o DM, HTN, HLD, anxiety and COVID 19 who is admitted with sepsis and suspected meningitis.  Pt sedated and ventilated. OGT in place. Plan is to initiate tube feeds today. Per chart, pt appears weight stable pta.   Medications reviewed and include: colace, lovenox, insulin, miralax, omnipen, ceftriaxone, precedex, pepcid, levophed, propofol, vancomycin   Labs reviewed: K 3.2(L), P 3.0 wnl, Mg 2.0 wnl Wbc- 13.9(H), Hgb 10.4(L), Hct 34.0(L) Cbgs- 146, 210, 167 x 24 hrs AIC 8.2(H)- 11/6  Patient is currently intubated on ventilator support MV: 9.2 L/min Temp (24hrs), Avg:97.8 F (36.6 C), Min:96.3 F (35.7 C), Max:98.8 F (37.1 C)  Propofol: 3.4 ml/hr- provides 90kcal/day   MAP- >38mmHg  UOP- 77m  NUTRITION - FOCUSED PHYSICAL EXAM:  Flowsheet Row Most Recent Value  Orbital Region No depletion  Upper Arm Region No depletion  Thoracic and Lumbar Region No depletion  Buccal Region No depletion  Temple Region No depletion  Clavicle Bone Region No depletion  Clavicle and Acromion Bone Region No depletion  Scapular Bone Region No depletion  Dorsal Hand No depletion   Patellar Region No depletion  Anterior Thigh Region No depletion  Posterior Calf Region No depletion  Edema (RD Assessment) Mild  Hair Reviewed  Eyes Reviewed  Mouth Reviewed  Skin Reviewed  Nails Reviewed   Diet Order:   Diet Order             Diet NPO time specified  Diet effective now                  EDUCATION NEEDS:   No education needs have been identified at this time  Skin:  Skin Assessment: Reviewed RN Assessment (ecchymosis)  Last BM:  pta  Height:   Ht Readings from Last 1 Encounters:  06/20/21 5\' 1"  (1.549 m)    Weight:   Wt Readings from Last 1 Encounters:  06/21/21 103.3 kg    Ideal Body Weight:  47.7 kg  BMI:  Body mass index is 43.03 kg/m.  Estimated Nutritional Needs:   Kcal:  1136-1446kcal/day  Protein:  >95g/day  Fluid:  1.4-1.7L/day  MS, RD, LDN Please refer to [facility] for RD and/or RD on-call/weekend/after hours pager

## 2021-06-21 NOTE — Consult Note (Signed)
PHARMACY CONSULT NOTE  Pharmacy Consult for Electrolyte Monitoring and Replacement   Recent Labs: Potassium (mmol/L)  Date Value  06/21/2021 3.2 (L)  11/19/2014 3.8   Magnesium (mg/dL)  Date Value  01/41/0301 2.0   Calcium (mg/dL)  Date Value  31/43/8887 8.7 (L)   Calcium, Total (mg/dL)  Date Value  57/97/2820 9.4   Albumin (g/dL)  Date Value  60/15/6153 2.9 (L)  11/19/2014 3.9   Phosphorus (mg/dL)  Date Value  79/43/2761 3.0   Sodium (mmol/L)  Date Value  06/21/2021 140  11/19/2014 139   Assessment: Patient is a 60 y/o F with medical history including diabetes, HTN, vertigo, HLD, anxiety, chronic back pain who presented to the ED 11/5 with altered mentation. Patient subsequently found to have elevated lactic acid and leukocytosis without obvious source. There is concern for meningitis and lumbar puncture was performed and patient started on broad spectrum antimicrobial coverage. Patient ultimately required intubation for airway protection due to worsening agitation. Patient is currently admitted to the ICU. Pharmacy consulted to assist with electrolyte monitoring and replacement as indicated.  Goal of Therapy:  Electrolytes within normal limits  Plan:  --Potassium 40 mEq per tube --Follow-up electrolytes with AM labs tomorrow  Pricilla Riffle, PharmD, BCPS Clinical Pharmacist 06/21/2021 3:08 PM

## 2021-06-21 NOTE — Progress Notes (Signed)
Patient HR dropped to 27 bpm suddenly without an appearing inciting event. Patient became hypotensive with BP:80's/40's - Sedation stopped - 1 mg of atropine administered  - levophed drip initiated - STAT EKG: NSR, normal axis,   Patient's rhythm stabilized and sedation restarted. Continue levophed PRN to maintain MAP > 65.  - echocardiogram ordered for this AM - AM labs sent, will replace electrolytes as needed    Cheryll Cockayne Rust-Chester, AGACNP-BC Acute Care Nurse Practitioner [facility]   [phone] / [phone] Please see Amion for pager details.

## 2021-06-21 NOTE — Progress Notes (Signed)
Per primary hold off PICC, order will be discontinued by RN.

## 2021-06-21 NOTE — Progress Notes (Signed)
NAME:  Lauren Velazquez, MRN:  [id number], DOB:  May 20, 1961, LOS: 1 ADMISSION DATE:  06/19/2021 CONSULTATION DATE:  06/19/2021  REFERRING MD:  Dr. Elesa Massed  CHIEF COMPLAINT:  Altered Mental Status    BRIEF SYNOPSIS: 60 y/o F with PMH  History of Present Illness:  60 year old female presenting to [facility] ED from home via EMS for multiple complaints including stroke, chest pain, shortness of breath and pain all over. (Per ED documentation)  ED course: Per ED documentation upon arrival patient was agitated and extremely altered, exhibiting bizarre behavior.  She was screaming and yelling sticking her finger down her throat and attempt to make her self vomit.  While sitting in a recliner began attempting to flip it over eventually requiring physical restraint for her own safety.  ED staff attempted to sedate her as she was kicking and swinging arms at staff and husband.  Her main complaint to the ED provider was chest pain, she stated she was worried that she might have had a heart attack as well as feeling very dehydrated.  She also insisted on being given her chronic pain medication, however she also told staff she will take all her pills and overdose. Patient was placed under IVC.  Per ED documentation, husband denied recreational drug use or any new medications (unclear if this comment was made to EDP or EMS).  While in the ED patient became febrile and sepsis protocol was initiated.  No definitive source of infection noted in CXR/UA, this in combination with severely altered mental status raise concern for meningitis.  Due to patient's increasingly agitated state refractory to benzodiazepines the decision was made by the EDP to mechanically intubate for airway protection in order to provide further sedation so that an LP could be performed safely  Medications given: 50 mg Benadryl, 5 mg Haldol, 2 mg Ativan x2, cefepime/vancomycin/acyclovir, 10 mg of Decadron, total 2.6 L IV fluid bolus, succinylcholine for RSI,  650 mg acetaminophen for fever Initial Vitals: Severely febrile at 103.9, tachypneic 32, tachycardic 134, hypertensive urgency 232/97 & 95% on room air EKG Interpretation: Date: 06/19/2021, EKG Time: 23:23, Rate: 121, Rhythm: Sinus tachycardia, QRS Axis: RAD, Intervals: Prolonged QTC, ST/T Wave abnormalities: None, Narrative Interpretation: Sinus tachycardia with RAD Chemistry: Na+: 134, K+: 3.9, BUN/Cr.:  9/0.76, Serum CO2/ AG: 28/9 Hematology: WBC: 14.4, Hgb: 12.2,  Normal troponin: 8> 15,  Lactic/ PCT: 4.5/pending, COVID-19 & Influenza A/B: negative ABG: 7.46/32/177/22.8 CXR 06/20/21: mild central vascular congestion CT head wo contrast 06/20/21: no acute intracranial abnormality   PCCM consulted for admission due to need for emergent intubation for airway protection and mechanical ventilatory support.  Pertinent  Medical History  T2DM HTN Vertigo HLD Anxiety Chronic Back pain COVID-19   Significant Hospital Events: Including procedures, antibiotic start and stop dates in addition to other pertinent events   06/20/21- Admit to ICU s/p mechanical intubation for airway protection in the setting of increased sedation needs due to continued agitation s/t suspected meningitis 06/21/21- Intubated and sedation, agitated when sedation was weaned today. MRI w/out contrast shows acute infarcts (embolic vs. Hypotensive). ID, neuro, and psych consulted.    Micro Data:  11/6>> CSF- mild pleocytosis   Antimicrobials:  11/6 Cefepime >> x 1 dose 11/6 Vancomycin >>  11/6 Ampicillin >> 11/6 Ceftriaxone >> 11/6 Acyclovir >>  Interim History / Subjective:  I was able to speak with the patient's husband Maisie Fus) today at bedside. Upon further questioning of events that brought her to the hospital, he reports that she  woke up yesterday, would not get out of bed and "went off". He then called EMS. He reports that this has never happened before and she is typically "happy go lucky". He reported that she  has had fevers on/off over the past month but no other signs or symptoms of illness.   Overnight: per RN note, patient's HR dropped down into 20s and BP 90s/40s without inciting event. Pt was given 1mg  atropine, and started on levo. STAT EKG showed NSR, normal axis. Echo ordered.   Today: patient is intubated and sedated. Per RN, when trying to wean sedation, patient became agitation and was trying to pull tubes, had to be re-sedated. On my exam, she was minimally responsible, but able to follow some commands such as opening her eyes and wiggling her toes.   Objective   Blood pressure 131/66, pulse (!) 55, temperature 98.4 F (36.9 C), temperature source Esophageal, resp. rate 13, height 5\' 1"  (1.549 m), weight 103.3 kg, last menstrual period 08/15/2008, SpO2 99 %.    Vent Mode: PRVC FiO2 (%):  [30 %] 30 % Set Rate:  [1 bmp-18 bmp] 18 bmp Vt Set:  [450 mL] 450 mL PEEP:  [5 cmH20] 5 cmH20 Plateau Pressure:  [20 cmH20] 20 cmH20   Intake/Output Summary (Last 24 hours) at 06/21/2021 1347 Last data filed at 06/21/2021 1236 Gross per 24 hour  Intake 3211.21 ml  Output 2765 ml  Net 446.21 ml   Filed Weights   06/19/21 1716 06/20/21 0500 06/21/21 0500  Weight: 104.8 kg 105.9 kg 103.3 kg   REVIEW OF SYSTEMS Patient is unable to provide ROS due sedation and intubation.   PHYSICAL EXAMINATION: General: Adult female, lying in bed intubated & sedated requiring mechanical ventilation, NAD HEENT: MM pink/moist, anicteric, atraumatic, neck supple Neuro: sedated,  PERRL +3 CV: s1s2 RRR, NSR on monitor, no r/m/g Pulm: Regular, non labored on PRVC @ 40% & PEEP 5, breath sounds coarse-BUL & diminished-BLL GI: soft, obese, bs x 4 GU: foley in place with clear yellow urine Skin: multiple scratches scattered and scabbed Extremities: warm/dry, pulses + 2 R/P, no edema noted   Labs/imaging that I havepersonally reviewed  (right click and "Reselect all SmartList Selections" daily)  CXR 06/20/21: mild  central vascular congestion CT head wo contrast 06/20/21: no acute intracranial abnormality CT abd/pelvis 06/20/21: No evidence of inflammatory process or abscess. Evidence of cholelithiasis, bilateral nephrolithiasis, 5.4 cm benign-appearing left adnexal mass.   CSF 06/20/21: mild pleocytosis MR brain w/out 06/21/21: Scattered punctate acute infarcts in both cerebral hemispheres (bilateral PCA and left MCA territories). No associated hemorrhage or mass effect. Although the posterior fossa and anterior cerebral artery territories are spared, the pattern is suspicious for a recent embolic event. Hypotensive episode might also produce this appearance. 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  Echo 06/21/21: EF 60-65%, otherwise normal.   Resolved Hospital Problem list    ASSESSMENT AND PLAN SYNOPSIS: Sepsis without septic shock due to suspected meningitis Lactic: 4.5-->3.9>> 1.4, Baseline PCT: <0.10, UA: +ketones +protein, CXR: mild central vascular congestion, CTH: negative, LP: mild pleocytosis  - Initial interventions/workup included: 1.6 L of LR & 1 L NS & Cefepime/ Vancomycin/ acyclovir - severely febrile (resolved) > cooling blanket ordered, (1st dose of tylenol given almost 4 hours ago with daylight savings consideration) additional doses of tylenol & ibuprofen ordered - Supplemental oxygen as needed, to maintain SpO2 > 90% - f/u cultures, trend lactic/  PCT, f/u CK level - Daily CBC, monitor WBC/ fever curve - IV antibiotics for meningitis coverage: ceftriaxone, vancomycin, ampicillin & acyclovir - IVF hydration as needed: LR @ 150 mL/h - Strict I/O's: alert provider if UOP < 0.5 mL/kg/hr   Acute Respiratory Failure and mechanical intubation for airway protection secondary to sedation requirements in the setting of severe agitation & encephalopathy  PMHx: suspected OSA (not completely w/u outpt) - Ventilator settings:  PRVC  8 mL/kg, 30% FiO2, 5 PEEP, continue ventilator support & lung protective strategies - Wean PEEP & FiO2 as tolerated, maintain SpO2 > 90% - Head of bed elevated 30 degrees, VAP protocol in place - Plateau pressures less than 30 cm H20  - Intermittent chest x-ray & ABG PRN - Daily WUA with SBT as tolerated  - Ensure adequate pulmonary hygiene  - F/u cultures, trend PCT - Budesonide & Brovana nebs BID, bronchodilators PRN - PAD protocol in place: continue Fentanyl drip & Propofol drip with Versed PRN (may add or switch to precedex tomorrow)   Acute Encephalopathy secondary to suspected meningitis PMHx: Anxiety, chronic pain (w/ opioid dependence)  - supportive care - psychiatry consult in place - Per ID- CSF shows mild pleocytosis (lymphatic), unlikely bacterial (continue abx & acyclovir for now), need to r/o HSV encephalitis. Consider MRI with contrast.  - EEG results pending - neurology consult- appreciate input for MRI and CSF findings - continue outpatient: gabapentin, trazodone, Volatren gel - outpatient Effexor on hold while intubated, consider restarting as patient stabilizes   Acute bilateral punctate infarcts (embolic vs. hypotensive episode)   - Neurology consult- appreciate input for MRI and CSF findings - Echo complete- EF 60-65%, otherwise normal.  - Continuous cardiac monitoring  - Etiology: embolic vs. hypotensive (80s-90s/50s-60s) & bradycardic episode overnight (0000-0100) may have lead to hypotensive findings on MRI (MRI at 0357). Normal echo, no evidence of AFIB on EKG/continuous monitor.   Hypertension Urgency (improved) PMHx: HLD, HTN - Do believe this will be better controlled with improved sedation & fever control - hydralazine PRN Q 4, can consider nicardipine drip if needed - continuous cardiac monitoring - hold outpatient lisinopril, consider restarting as patient stabilizes   Poorly controlled Type 2 Diabetes Mellitus - Hemoglobin A1C: pending -  Monitor CBG Q 4 hours - SSI moderate dosing - continue outpatient Lantus dosing: 40 units BID - target range while in ICU: 140-180 - follow ICU hyper/hypo-glycemia protocol  Best practice (right click and "Reselect all SmartList Selections" daily)  Diet: NPO Pain/Anxiety/Delirium protocol (if indicated): Yes (RASS goal -1) VAP protocol (if indicated): Yes DVT prophylaxis: SCD GI prophylaxis: H2B Glucose control:  SSI Yes Central venous access:  Yes, and it is still needed Arterial line:  N/A Foley:  Yes, and it is still needed Mobility:  bed rest  Code Status:  FULL Disposition:ICU  Labs   CBC: Recent Labs  Lab 06/19/21 1722 06/20/21 0500  WBC 14.4* 13.9*  NEUTROABS 12.9*  --   HGB 12.2 10.4*  HCT 39.4 34.0*  MCV 80.4 81.7  PLT 507* 419*    Basic Metabolic Panel: Recent Labs  Lab 06/19/21 1722 06/20/21 0135 06/20/21 0500 06/20/21 0733 06/21/21 0524  NA 134*  --   --  136 140  K 3.9  --   --  4.2 3.2*  CL 97*  --   --  102 104  CO2 28  --   --  25 27  GLUCOSE 386*  --   --  269* 152*  BUN 9  --   --  7 8  CREATININE 0.76  --   --  0.69 0.56  CALCIUM 9.4  --   --  8.5* 8.7*  MG  --  2.0 1.7  --  2.0  PHOS  --  1.2* 3.3  --  3.0   GFR: Estimated Creatinine Clearance: 84.7 mL/min (by C-G formula based on SCr of 0.56 mg/dL). Recent Labs  Lab 06/19/21 1722 06/19/21 2257 06/20/21 0052 06/20/21 0500 06/20/21 0733 06/20/21 1831  PROCALCITON  --  <0.10  --   --   --   --   WBC 14.4*  --   --  13.9*  --   --   LATICACIDVEN  --  4.5* 3.4*  --  3.9* 1.4    Liver Function Tests: Recent Labs  Lab 06/19/21 2257 06/21/21 0524  AST 24 18  ALT 13 13  ALKPHOS 90 73  BILITOT 0.7 0.4  PROT 7.3 6.3*  ALBUMIN 3.6 2.9*   No results for input(s): LIPASE, AMYLASE in the last 168 hours. Recent Labs  Lab 06/20/21 0135  AMMONIA 35    ABG    Component Value Date/Time   PHART 7.46 (H) 06/20/2021 0204   PCO2ART 32 06/20/2021 0204   PO2ART 177 (H) 06/20/2021  0204   HCO3 22.8 06/20/2021 0204   ACIDBASEDEF 0.5 06/20/2021 0204   O2SAT 99.6 06/20/2021 0204     Coagulation Profile: Recent Labs  Lab 06/20/21 0135  INR 1.1    Cardiac Enzymes: Recent Labs  Lab 06/20/21 0135 06/20/21 0500 06/21/21 0524  CKTOTAL 393* 473* 210    HbA1C: Hgb A1c MFr Bld  Date/Time Value Ref Range Status  06/20/2021 05:00 AM 8.2 (H) 4.8 - 5.6 % Final    Comment:    (NOTE) Pre diabetes:          5.7%-6.4%  Diabetes:              >6.4%  Glycemic control for   <7.0% adults with diabetes   03/06/2020 07:05 AM 9.1 (H) 4.8 - 5.6 % Final    Comment:    (NOTE)         Prediabetes: 5.7 - 6.4         Diabetes: >6.4         Glycemic control for adults with diabetes: <7.0     CBG: Recent Labs  Lab 06/20/21 2007 06/20/21 2343 06/21/21 0427 06/21/21 0751 06/21/21 1114  GLUCAP 222* 187* 146* 210* 167*     Past Medical History:  She,  has a past medical history of Anemia, Anxiety, Asthma, Diabetes mellitus without complication (HCC), Hypertension, and Vertigo.   Surgical History:   Past Surgical History:  Procedure Laterality Date   CARPAL TUNNEL RELEASE Right 2015   KNEE ARTHROSCOPY Left 05/17/2018   Procedure: ARTHROSCOPY KNEE WITH LATERAL RELEASE, PARTIAL SYNOVECTOMY;  Surgeon: Kennedy Bucker, MD;  Location: [facility] ORS;  Service: Orthopedics;  Laterality: Left;   KNEE ARTHROSCOPY WITH LATERAL RELEASE Right 12/28/2017   Procedure: KNEE ARTHROSCOPY WITH LATERAL RELEASE;  Surgeon: Kennedy Bucker, MD;  Location: [facility] ORS;  Service: Orthopedics;  Laterality: Right;   TIBIAL TUBERCLERPLASTY Bilateral 06/01/2017   Procedure: TIBIAL TUBERCLE SPUR EXCISION;  Surgeon: Kennedy Bucker, MD;  Location: [facility] ORS;  Service: Orthopedics;  Laterality: Bilateral;     Social History:   reports that she has never smoked. She has never used smokeless tobacco. She reports that she does not drink alcohol and does not  use drugs.   Family History:  Her family history is  not on file.   Allergies Allergies  Allergen Reactions   Atorvastatin Swelling     Home Medications  Prior to Admission medications   Medication Sig Start Date End Date Taking? Authorizing Provider  diclofenac sodium (VOLTAREN) 1 % GEL Apply 2 g topically 2 (two) times daily as needed (pain).    Yes [provider]  ferrous gluconate (FERGON) 324 MG tablet Take 324 mg by mouth 3 (three) times daily. 04/02/21  Yes [provider]  Fluticasone-Umeclidin-Vilant 100-62.5-25 MCG/ACT AEPB Inhale 1 puff into the lungs daily. 05/18/21  Yes [provider]  gabapentin (NEURONTIN) 300 MG capsule Take 600 mg by mouth 2 (two) times daily.    Yes [provider]  insulin aspart (NOVOLOG) 100 UNIT/ML injection Inject 0-20 Units into the skin 3 (three) times daily with meals. (based upon blood sugar reading)   Yes [provider]  LANTUS SOLOSTAR 100 UNIT/ML Solostar Pen Inject 40 Units into the skin 2 (two) times daily. This is decreased from prior dose of 70 units twice daily due to low blood glucose. 07/22/20  Yes Darlin Priestly, MD  lisinopril (ZESTRIL) 40 MG tablet Take 40 mg by mouth daily.    Yes [provider]  oxyCODONE-acetaminophen (PERCOCET) 10-325 MG tablet Take 1 tablet by mouth every 6 (six) hours as needed for pain.  04/12/17  Yes [provider]  traZODone (DESYREL) 100 MG tablet Take 200 mg by mouth at bedtime as needed for sleep.   Yes [provider]  venlafaxine XR (EFFEXOR-XR) 75 MG 24 hr capsule Take 75-150 mg by mouth 2 (two) times daily.    Yes [provider]  VENTOLIN HFA 108 (90 Base) MCG/ACT inhaler Inhale 2 puffs into the lungs every 6 (six) hours as needed for wheezing or shortness of breath.    Yes [provider]     Ileana Roup PA-S [facility]

## 2021-06-21 NOTE — Procedures (Signed)
Routine EEG Report  Lauren Velazquez is a 60 y.o. female with a history of encephalopathy who is undergoing an EEG to evaluate for seizures.  Report: This EEG was acquired with electrodes placed according to the International 10-20 electrode system (including Fp1, Fp2, F3, F4, C3, C4, P3, P4, O1, O2, T3, T4, T5, T6, A1, A2, Fz, Cz, Pz). The following electrodes were missing or displaced: none.  The best background was 6 Hz and was reactive to stimulation. No waking rhythm was identified. Some sleep architecture was identified. There were focal slowing over the right hemisphere. There were no interictal epileptiform discharges. There were no electrographic seizures identified. Photic stimulation and hyperventilation were not performed.  Impression and clinical correlation: This EEG was obtained while comatose and is abnormal due to: - moderate diffuse slowing - focal right sided slowing  There were no epileptiform abnormalities noted during this recording.  Bing Neighbors, MD [facility] [phone]  If 7pm- 7am, please page neurology on call as listed in [facility]ION.

## 2021-06-21 NOTE — Progress Notes (Signed)
Pharmacy Antibiotic Note  Lauren Velazquez is a 60 y.o. female admitted on 06/19/2021 with meningitis.  Pharmacy has been consulted for Acyclovir and Vancomycin dosing for possible meningitis.  Patient presented with AMS and pain. CSF indicates lymphocytic plecocytosis with elevated glucose. MRI to rule out HSV encephalitis showing more of an embolic picture. Patient does have elevated systemic WBC. On day 2 of antibiotics and antivirals.   Plan: Acyclovir 10 mg/kg (Adjusted BW) q8hr per indication and renal function.  Vancomycin Pt given initial dose of Vancomycin 2 gm Continue vancomycin 750 mg IV Q 12 hrs.  Trough based dosing for meningitis with goal trough 15-20.  Also on ceftriaxone 2 grams every 12 hours per meningitis dosing, and ampicillin 2 grams every 4 hours for Listeria coverage (age > 76 yo)  Monitor clinical course, renal function, and levels at steady state.   Height: 5\' 1"  (154.9 cm) Weight: 103.3 kg (227 lb 11.8 oz) IBW/kg (Calculated) : 47.8  Temp (24hrs), Avg:97.6 F (36.4 C), Min:96.3 F (35.7 C), Max:98.1 F (36.7 C)  Recent Labs  Lab 06/19/21 1722 06/19/21 2257 06/20/21 0052 06/20/21 0500 06/20/21 0733 06/20/21 1831 06/21/21 0524  WBC 14.4*  --   --  13.9*  --   --   --   CREATININE 0.76  --   --   --  0.69  --  0.56  LATICACIDVEN  --  4.5* 3.4*  --  3.9* 1.4  --      Estimated Creatinine Clearance: 84.7 mL/min (by C-G formula based on SCr of 0.56 mg/dL).    Allergies  Allergen Reactions   Atorvastatin Swelling    Antimicrobials this admission: 11/6 Vancomycin >>  11/6 Ampicillin >> 11/6 Ceftriaxone >> 11/6 Acyclovir >> 11/6 Cefepime >> x 1 dose  Microbiology results: 11/6 BCx: NG x 1 d 11/6 CSF Cx: No organisms. WBC seen, RBC present  Thank you for allowing pharmacy to be a part of this patient's care.  13/6, PharmD Pharmacy Resident  06/21/2021 10:46 AM

## 2021-06-22 DIAGNOSIS — R4182 Altered mental status, unspecified: Secondary | ICD-10-CM | POA: Diagnosis not present

## 2021-06-22 DIAGNOSIS — J9601 Acute respiratory failure with hypoxia: Secondary | ICD-10-CM | POA: Diagnosis not present

## 2021-06-22 DIAGNOSIS — R509 Fever, unspecified: Secondary | ICD-10-CM | POA: Diagnosis not present

## 2021-06-22 DIAGNOSIS — G0490 Encephalitis and encephalomyelitis, unspecified: Secondary | ICD-10-CM

## 2021-06-22 DIAGNOSIS — G9340 Encephalopathy, unspecified: Secondary | ICD-10-CM | POA: Diagnosis not present

## 2021-06-22 LAB — VDRL, CSF: VDRL Quant, CSF: NONREACTIVE

## 2021-06-22 LAB — CBC
HCT: 31 % — ABNORMAL LOW (ref 36.0–46.0)
Hemoglobin: 9.4 g/dL — ABNORMAL LOW (ref 12.0–15.0)
MCH: 24.5 pg — ABNORMAL LOW (ref 26.0–34.0)
MCHC: 30.3 g/dL (ref 30.0–36.0)
MCV: 80.7 fL (ref 80.0–100.0)
Platelets: 383 10*3/uL (ref 150–400)
RBC: 3.84 MIL/uL — ABNORMAL LOW (ref 3.87–5.11)
RDW: 15 % (ref 11.5–15.5)
WBC: 12.3 10*3/uL — ABNORMAL HIGH (ref 4.0–10.5)
nRBC: 0 % (ref 0.0–0.2)

## 2021-06-22 LAB — BASIC METABOLIC PANEL
Anion gap: 7 (ref 5–15)
BUN: <5 mg/dL — ABNORMAL LOW (ref 6–20)
CO2: 30 mmol/L (ref 22–32)
Calcium: 8.3 mg/dL — ABNORMAL LOW (ref 8.9–10.3)
Chloride: 104 mmol/L (ref 98–111)
Creatinine, Ser: 0.43 mg/dL — ABNORMAL LOW (ref 0.44–1.00)
GFR, Estimated: >60 mL/min (ref >60)
Glucose, Bld: 182 mg/dL — ABNORMAL HIGH (ref 70–99)
Potassium: 3.1 mmol/L — ABNORMAL LOW (ref 3.5–5.1)
Sodium: 141 mmol/L (ref 135–145)

## 2021-06-22 LAB — GLUCOSE, CAPILLARY
Glucose-Capillary: 108 mg/dL — ABNORMAL HIGH (ref 70–99)
Glucose-Capillary: 163 mg/dL — ABNORMAL HIGH (ref 70–99)
Glucose-Capillary: 175 mg/dL — ABNORMAL HIGH (ref 70–99)
Glucose-Capillary: 184 mg/dL — ABNORMAL HIGH (ref 70–99)
Glucose-Capillary: 189 mg/dL — ABNORMAL HIGH (ref 70–99)
Glucose-Capillary: 233 mg/dL — ABNORMAL HIGH (ref 70–99)

## 2021-06-22 LAB — PHOSPHORUS: Phosphorus: 2.5 mg/dL (ref 2.5–4.6)

## 2021-06-22 LAB — MAGNESIUM: Magnesium: 2 mg/dL (ref 1.7–2.4)

## 2021-06-22 LAB — VANCOMYCIN, TROUGH: Vancomycin Tr: 9 ug/mL — ABNORMAL LOW (ref 15–20)

## 2021-06-22 MED ORDER — FREE WATER
100.0000 mL | Status: DC
  Administered 2021-06-22 – 2021-06-24 (×11): 100 mL

## 2021-06-22 MED ORDER — VITAL HIGH PROTEIN PO LIQD
1000.0000 mL | ORAL | Status: DC
  Administered 2021-06-22 – 2021-06-23 (×2): 1000 mL

## 2021-06-22 MED ORDER — FUROSEMIDE 10 MG/ML IJ SOLN
20.0000 mg | Freq: Once | INTRAMUSCULAR | Status: AC
  Administered 2021-06-22: 20 mg via INTRAVENOUS
  Filled 2021-06-22: qty 2

## 2021-06-22 MED ORDER — PROSOURCE TF PO LIQD
45.0000 mL | Freq: Two times a day (BID) | ORAL | Status: DC
  Administered 2021-06-22 – 2021-06-23 (×2): 45 mL
  Filled 2021-06-22 (×3): qty 45

## 2021-06-22 MED ORDER — VALPROIC ACID 250 MG/5ML PO SOLN
500.0000 mg | Freq: Two times a day (BID) | ORAL | Status: DC
  Administered 2021-06-22 – 2021-06-25 (×8): 500 mg
  Filled 2021-06-22 (×10): qty 10

## 2021-06-22 MED ORDER — VANCOMYCIN HCL 1250 MG/250ML IV SOLN
1250.0000 mg | Freq: Two times a day (BID) | INTRAVENOUS | Status: DC
Start: 2021-06-23 — End: 2021-06-22
  Filled 2021-06-22: qty 250

## 2021-06-22 MED ORDER — CYPROHEPTADINE HCL 4 MG PO TABS
4.0000 mg | ORAL_TABLET | Freq: Four times a day (QID) | ORAL | Status: DC
  Administered 2021-06-22 – 2021-06-24 (×7): 4 mg
  Filled 2021-06-22 (×9): qty 1

## 2021-06-22 MED ORDER — VANCOMYCIN HCL 1250 MG/250ML IV SOLN
1250.0000 mg | Freq: Two times a day (BID) | INTRAVENOUS | Status: DC
  Administered 2021-06-22 – 2021-06-24 (×4): 1250 mg via INTRAVENOUS
  Filled 2021-06-22 (×4): qty 250

## 2021-06-22 MED ORDER — POTASSIUM CHLORIDE 20 MEQ PO PACK
40.0000 meq | PACK | ORAL | Status: AC
  Administered 2021-06-22 (×2): 40 meq
  Filled 2021-06-22 (×2): qty 2

## 2021-06-22 MED ORDER — ADULT MULTIVITAMIN LIQUID CH
15.0000 mL | Freq: Every day | ORAL | Status: DC
  Administered 2021-06-23 – 2021-06-25 (×3): 15 mL
  Filled 2021-06-22 (×6): qty 15

## 2021-06-22 NOTE — Progress Notes (Signed)
NAME:  Lauren Velazquez, MRN:  [id number], DOB:  February 22, 1961, LOS: 2 ADMISSION DATE:  06/19/2021 CONSULTATION DATE:  06/19/2021  REFERRING MD:  Dr. Leonides Schanz  CHIEF COMPLAINT:  Altered Mental Status    BRIEF SYNOPSIS: 60 y/o F with PMH  History of Present Illness:  60 year old female presenting to [facility] ED from home via EMS for multiple complaints including stroke, chest pain, shortness of breath and pain all over. (Per ED documentation)  ED course: Per ED documentation upon arrival patient was agitated and extremely altered, exhibiting bizarre behavior.  She was screaming and yelling sticking her finger down her throat and attempt to make her self vomit.  While sitting in a recliner began attempting to flip it over eventually requiring physical restraint for her own safety.  ED staff attempted to sedate her as she was kicking and swinging arms at staff and husband.  Her main complaint to the ED provider was chest pain, she stated she was worried that she might have had a heart attack as well as feeling very dehydrated.  She also insisted on being given her chronic pain medication, however she also told staff she will take all her pills and overdose. Patient was placed under IVC.  Per ED documentation, husband denied recreational drug use or any new medications (unclear if this comment was made to EDP or EMS).  While in the ED patient became febrile and sepsis protocol was initiated.  No definitive source of infection noted in CXR/UA, this in combination with severely altered mental status raise concern for meningitis.  Due to patient's increasingly agitated state refractory to benzodiazepines the decision was made by the EDP to mechanically intubate for airway protection in order to provide further sedation so that an LP could be performed safely  Medications given: 50 mg Benadryl, 5 mg Haldol, 2 mg Ativan x2, cefepime/vancomycin/acyclovir, 10 mg of Decadron, total 2.6 L IV fluid bolus, succinylcholine for RSI,  650 mg acetaminophen for fever Initial Vitals: Severely febrile at 103.9, tachypneic 32, tachycardic 134, hypertensive urgency 232/97 & 95% on room air EKG Interpretation: Date: 06/19/2021, EKG Time: 23:23, Rate: 121, Rhythm: Sinus tachycardia, QRS Axis: RAD, Intervals: Prolonged QTC, ST/T Wave abnormalities: None, Narrative Interpretation: Sinus tachycardia with RAD Chemistry: Na+: 134, K+: 3.9, BUN/Cr.:  9/0.76, Serum CO2/ AG: 28/9 Hematology: WBC: 14.4, Hgb: 12.2,  Normal troponin: 8> 15,  Lactic/ PCT: 4.5/pending, COVID-19 & Influenza A/B: negative ABG: 7.46/32/177/22.8 CXR 06/20/21: mild central vascular congestion CT head wo contrast 06/20/21: no acute intracranial abnormality   PCCM consulted for admission due to need for emergent intubation for airway protection and mechanical ventilatory support.  Pertinent  Medical History  T2DM HTN Vertigo HLD Anxiety Chronic Back pain COVID-19   Significant Hospital Events: Including procedures, antibiotic start and stop dates in addition to other pertinent events   06/20/21- Admit to ICU s/p mechanical intubation for airway protection in the setting of increased sedation needs due to continued agitation s/t suspected meningitis 06/21/21- Intubated and sedation, agitated when sedation was weaned today. MRI w/out contrast shows acute infarcts (embolic vs. Hypotensive). ID, neuro, and psych consulted.   06/22/2021-persistent agitation off of sedation.  Started Depakene for mood stabilization and cyproheptadine for potential serotonergic symptoms.  Micro Data:  11/6>> CSF- mild pleocytosis   Antimicrobials:  11/6 Cefepime >> x 1 dose 11/6 Vancomycin >>  11/6 Ampicillin >> 11/6 Ceftriaxone >> 11/6 Acyclovir >>  Interim History / Subjective:  Agitated when sedation is weaned off.  Overnight: No events overnight.  Today: patient is intubated and sedated. Per RN, when trying to wean sedation, patient became agitation and was trying to pull  tubes, had to be re-sedated. On my exam, she was minimally responsive due to sedation  Objective   Blood pressure (!) 106/57, pulse 78, temperature 98.4 F (36.9 C), temperature source Esophageal, resp. rate 15, height 5' 1"  (1.549 m), weight 103.9 kg, last menstrual period 08/15/2008, SpO2 98 %.    Vent Mode: PRVC FiO2 (%):  [28 %-30 %] 28 % Set Rate:  [18 bmp] 18 bmp Vt Set:  [450 mL] 450 mL PEEP:  [5 cmH20] 5 cmH20   Intake/Output Summary (Last 24 hours) at 06/22/2021 1934 Last data filed at 06/22/2021 1900 Gross per 24 hour  Intake 3334.24 ml  Output 2850 ml  Net 484.24 ml    Filed Weights   06/20/21 0500 06/21/21 0500 06/22/21 0327  Weight: 105.9 kg 103.3 kg 103.9 kg   REVIEW OF SYSTEMS Patient is unable to provide ROS due sedation and intubation.   PHYSICAL EXAMINATION: General: Adult female, lying in bed intubated & sedated requiring mechanical ventilation, no ventilator asynchrony HEENT: MM pink/moist, anicteric, atraumatic, neck supple Neuro: sedated,  PERRL +3 CV: s1s2 RRR, NSR on monitor, no r/m/g Pulm: Regular, non labored on PRVC @ 28% & PEEP 5, breath sounds coarse-BUL & diminished-BLL GI: soft, obese, bs x 4 GU: foley in place with clear yellow urine Skin: multiple scratches scattered and scabbed Extremities: warm/dry, pulses + 2 R/P, no edema noted   Labs/imaging that I havepersonally reviewed  (right click and "Reselect all SmartList Selections" daily)  CXR 06/20/21: mild central vascular congestion CT head wo contrast 06/20/21: no acute intracranial abnormality CT abd/pelvis 06/20/21: No evidence of inflammatory process or abscess. Evidence of cholelithiasis, bilateral nephrolithiasis, 5.4 cm benign-appearing left adnexal mass.   CSF 06/20/21: mild pleocytosis MR brain w/out 06/21/21: Scattered punctate acute infarcts in both cerebral hemispheres (bilateral PCA and left MCA territories). No associated hemorrhage or mass effect. Although the posterior fossa and  anterior cerebral artery territories are spared, the pattern is suspicious for a recent embolic event. Hypotensive episode might also produce this appearance. 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  Echo 06/21/21: EF 60-65%, otherwise normal.   Resolved Hospital Problem list    ASSESSMENT AND PLAN  Sepsis without septic shock due to suspected meningitis versus viral illness Lactic: 4.5-->3.9>> 1.4, Baseline PCT: <0.10, UA: +ketones +protein, CXR: mild central vascular congestion, CTH: negative, LP: mild pleocytosis  - Initial interventions/workup included: 1.6 L of LR & 1 L NS & Cefepime/ Vancomycin/ acyclovir - severely febrile (resolved) > cooling blanket ordered, (1st dose of tylenol given almost 4 hours ago with daylight savings consideration) additional doses of tylenol & ibuprofen ordered - Supplemental oxygen as needed, to maintain SpO2 > 90% - f/u cultures, trend lactic/ PCT, f/u CK level - Daily CBC, monitor WBC/ fever curve - IV antibiotics for meningitis coverage: ceftriaxone, vancomycin, ampicillin & acyclovir - IVF hydration as needed: LR @ 150 mL/h - Strict I/O's: alert provider if UOP < 0.5 mL/kg/hr   Fever - Resolved - ? Viral illness - ? Serotonin syndrome  Acute Respiratory Failure and mechanical intubation for airway protection secondary to sedation requirements in the setting of severe agitation & encephalopathy  PMHx: suspected OSA (not completely w/u outpt) - Ventilator settings: PRVC  8 mL/kg, 328% FiO2, 5 PEEP, continue ventilator support & lung protective strategies -  Wean PEEP & FiO2 as tolerated, maintain SpO2 > 90% - Head of bed elevated 30 degrees, VAP protocol in place - Plateau pressures less than 30 cm H20  - Intermittent chest x-ray & ABG PRN - Daily WUA with SBT as tolerated  - Ensure adequate pulmonary hygiene  - F/u cultures, trend PCT - Budesonide & Brovana nebs BID,  bronchodilators PRN - PAD protocol in place: continue Fentanyl drip & Propofol drip with continue Precedex, wean off propofol as tolerated.  Acute Encephalopathy secondary to suspected meningitis R/O Serotonin syndrome, manic episode PMHx: Anxiety, chronic pain (w/ opioid dependence)  - supportive care - Appreciate input from psychiatry (cannot rule out manic episode) - Per ID- CSF shows mild pleocytosis (lymphatic), unlikely bacterial (continue abx & acyclovir for now), discontinue antibiotics if CSF studies negative. - EEG results pending - neurology consult- appreciate input for MRI and CSF findings - continue outpatient: gabapentin, discontinued trazodone (query serotonin syndrome) - Continue to hold Effexor on hold while intubated, can cause mania/hypomania -Start Depakene for mood stabilization, cyproheptadine for potential serotonin excess issues - STOP: Trazodone, Zofran (serotonin syndrome potential) particularly with concomitant use of fentanyl   Acute bilateral punctate infarcts (embolic vs. hypotensive episode)   - Neurology consult- appreciate input for MRI and CSF findings - Echo complete- EF 60-65%, otherwise normal.  - Continuous cardiac monitoring  - Etiology: embolic vs. hypotensive (80s-90s/50s-60s) & bradycardic episode overnight (0000-0100) may have lead to hypotensive findings on MRI (MRI at 0076). Normal echo, no evidence of AFIB on EKG/continuous monitor.   Hypertension Urgency (improved) PMHx: HLD, HTN - Do believe this will be better controlled with improved sedation & fever control - hydralazine PRN Q 4, can consider nicardipine drip if needed - continuous cardiac monitoring - hold outpatient lisinopril, consider restarting as patient stabilizes   Poorly controlled Type 2 Diabetes Mellitus - Hemoglobin A1C: pending - Monitor CBG Q 4 hours - SSI moderate dosing - continue outpatient Lantus dosing: 40 units BID - target range while in ICU: 140-180 - follow  ICU hyper/hypo-glycemia protocol  Best practice (right click and "Reselect all SmartList Selections" daily)  Diet: Begin TF Pain/Anxiety/Delirium protocol (if indicated): Yes (RASS goal -1) VAP protocol (if indicated): Yes DVT prophylaxis: SCD GI prophylaxis: H2B Glucose control:  SSI Yes Central venous access:  Yes, and it is still needed Arterial line:  N/A Foley:  Yes, and it is still needed Mobility:  bed rest  Code Status:  FULL Disposition:ICU  Labs   CBC: Recent Labs  Lab 06/19/21 1722 06/20/21 0500 06/21/21 1647 06/22/21 0448  WBC 14.4* 13.9* 15.0* 12.3*  NEUTROABS 12.9*  --   --   --   HGB 12.2 10.4* 9.9* 9.4*  HCT 39.4 34.0* 32.9* 31.0*  MCV 80.4 81.7 81.2 80.7  PLT 507* 419* 423* 383     Basic Metabolic Panel: Recent Labs  Lab 06/19/21 1722 06/20/21 0135 06/20/21 0500 06/20/21 0733 06/21/21 0524 06/22/21 0448  NA 134*  --   --  136 140 141  K 3.9  --   --  4.2 3.2* 3.1*  CL 97*  --   --  102 104 104  CO2 28  --   --  25 27 30   GLUCOSE 386*  --   --  269* 152* 182*  BUN 9  --   --  7 8 <5*  CREATININE 0.76  --   --  0.69 0.56 0.43*  CALCIUM 9.4  --   --  8.5*  8.7* 8.3*  MG  --  2.0 1.7  --  2.0 2.0  PHOS  --  1.2* 3.3  --  3.0 2.5    GFR: Estimated Creatinine Clearance: 84.9 mL/min (A) (by C-G formula based on SCr of 0.43 mg/dL (L)). Recent Labs  Lab 06/19/21 1722 06/19/21 2257 06/20/21 0052 06/20/21 0500 06/20/21 0733 06/20/21 1831 06/21/21 1647 06/22/21 0448  PROCALCITON  --  <0.10  --   --   --   --   --   --   WBC 14.4*  --   --  13.9*  --   --  15.0* 12.3*  LATICACIDVEN  --  4.5* 3.4*  --  3.9* 1.4  --   --      Liver Function Tests: Recent Labs  Lab 06/19/21 2257 06/21/21 0524  AST 24 18  ALT 13 13  ALKPHOS 90 73  BILITOT 0.7 0.4  PROT 7.3 6.3*  ALBUMIN 3.6 2.9*    No results for input(s): LIPASE, AMYLASE in the last 168 hours. Recent Labs  Lab 06/20/21 0135  AMMONIA 35     ABG    Component Value Date/Time    PHART 7.46 (H) 06/20/2021 0204   PCO2ART 32 06/20/2021 0204   PO2ART 177 (H) 06/20/2021 0204   HCO3 22.8 06/20/2021 0204   ACIDBASEDEF 0.5 06/20/2021 0204   O2SAT 99.6 06/20/2021 0204      Coagulation Profile: Recent Labs  Lab 06/20/21 0135  INR 1.1     Cardiac Enzymes: Recent Labs  Lab 06/20/21 0135 06/20/21 0500 06/21/21 0524  CKTOTAL 393* 473* 210     HbA1C: Hgb A1c MFr Bld  Date/Time Value Ref Range Status  06/20/2021 05:00 AM 8.2 (H) 4.8 - 5.6 % Final    Comment:    (NOTE) Pre diabetes:          5.7%-6.4%  Diabetes:              >6.4%  Glycemic control for   <7.0% adults with diabetes   03/06/2020 07:05 AM 9.1 (H) 4.8 - 5.6 % Final    Comment:    (NOTE)         Prediabetes: 5.7 - 6.4         Diabetes: >6.4         Glycemic control for adults with diabetes: <7.0     CBG: Recent Labs  Lab 06/22/21 0344 06/22/21 0755 06/22/21 1110 06/22/21 1540 06/22/21 1927  GLUCAP 163* 184* 233* 175* 108*        Allergies Allergies  Allergen Reactions   Atorvastatin Swelling     Home Medications  Prior to Admission medications   Medication Sig Start Date End Date Taking? Authorizing Provider  diclofenac sodium (VOLTAREN) 1 % GEL Apply 2 g topically 2 (two) times daily as needed (pain).    Yes [provider]  ferrous gluconate (FERGON) 324 MG tablet Take 324 mg by mouth 3 (three) times daily. 04/02/21  Yes [provider]  Fluticasone-Umeclidin-Vilant 100-62.5-25 MCG/ACT AEPB Inhale 1 puff into the lungs daily. 05/18/21  Yes [provider]  gabapentin (NEURONTIN) 300 MG capsule Take 600 mg by mouth 2 (two) times daily.    Yes [provider]  insulin aspart (NOVOLOG) 100 UNIT/ML injection Inject 0-20 Units into the skin 3 (three) times daily with meals. (based upon blood sugar reading)   Yes [provider]  LANTUS SOLOSTAR 100 UNIT/ML Solostar Pen Inject 40 Units into the skin 2 (two)  times daily. This is  decreased from prior dose of 70 units twice daily due to low blood glucose. 07/22/20  Yes Enzo Bi, MD  lisinopril (ZESTRIL) 40 MG tablet Take 40 mg by mouth daily.    Yes [provider]  oxyCODONE-acetaminophen (PERCOCET) 10-325 MG tablet Take 1 tablet by mouth every 6 (six) hours as needed for pain.  04/12/17  Yes [provider]  traZODone (DESYREL) 100 MG tablet Take 200 mg by mouth at bedtime as needed for sleep.   Yes [provider]  venlafaxine XR (EFFEXOR-XR) 75 MG 24 hr capsule Take 75-150 mg by mouth 2 (two) times daily.    Yes [provider]  VENTOLIN HFA 108 (90 Base) MCG/ACT inhaler Inhale 2 puffs into the lungs every 6 (six) hours as needed for wheezing or shortness of breath.    Yes [provider]    Scheduled Meds:  arformoterol  15 mcg Nebulization BID   budesonide (PULMICORT) nebulizer solution  0.25 mg Nebulization BID   chlorhexidine gluconate (MEDLINE KIT)  15 mL Mouth Rinse BID   Chlorhexidine Gluconate Cloth  6 each Topical Q0600   cyproheptadine  4 mg Per Tube Q6H   docusate  100 mg Per Tube BID   enoxaparin (LOVENOX) injection  0.5 mg/kg Subcutaneous Q24H   feeding supplement (PROSource TF)  45 mL Per Tube BID   feeding supplement (VITAL HIGH PROTEIN)  1,000 mL Per Tube Q24H   free water  100 mL Per Tube Q4H   gabapentin  600 mg Per Tube BID   insulin aspart  0-20 Units Subcutaneous Q4H   insulin glargine-yfgn  40 Units Subcutaneous BID   mouth rinse  15 mL Mouth Rinse 10 times per day   [START ON 06/23/2021] multivitamin  15 mL Per Tube Daily   polyethylene glycol  17 g Per Tube Daily   valproic acid  500 mg Per Tube BID   Continuous Infusions:  sodium chloride Stopped (06/21/21 1536)   acyclovir 720 mg (06/22/21 1458)   ampicillin (OMNIPEN) IV 2 g (06/22/21 1941)   cefTRIAXone (ROCEPHIN)  IV 2 g (06/22/21 1051)   dexmedetomidine (PRECEDEX) IV infusion Stopped (06/22/21 0155)   famotidine (PEPCID) IV 20 mg  (06/22/21 2103)   fentaNYL infusion INTRAVENOUS 200 mcg/hr (06/22/21 1949)   norepinephrine (LEVOPHED) Adult infusion 2 mcg/min (06/22/21 0600)   propofol (DIPRIVAN) infusion 35 mcg/kg/min (06/22/21 2105)   vancomycin 750 mg (06/22/21 0853)   PRN Meds:.sodium chloride, albuterol, diclofenac Sodium, fentaNYL, hydrALAZINE, ibuprofen, midazolam, polyethylene glycol    Patient discussed during multidisciplinary rounds.  The patient is critically ill with multiple organ systems failure and requires high complexity decision making for assessment and support, frequent evaluation and titration of therapies, application of advanced monitoring technologies and extensive interpretation of multiple databases. Critical Care Time devoted to patient care services described in this note is  45 minutes.     Renold Don, MD Advanced Bronchoscopy PCCM [facility]    *This note was dictated using voice recognition software/Dragon.  Despite best efforts to proofread, errors can occur which can change the meaning.  Any change was purely unintentional.

## 2021-06-22 NOTE — Consult Note (Signed)
  Psychiatry: Brief follow-up.  Came by intensive care unit.  Patient still ventilated and on sedating medicine.  Spoke with nursing.  There is still having some trouble getting her calm enough to be extubated.  I will continue to follow up.  No change to recommendations from yesterday

## 2021-06-22 NOTE — Progress Notes (Signed)
Inpatient Diabetes Program Recommendations  AACE/ADA: New Consensus Statement on Inpatient Glycemic Control (2015)  Target Ranges:  Prepandial:   less than 140 mg/dL      Peak postprandial:   less than 180 mg/dL (1-2 hours)      Critically ill patients:  140 - 180 mg/dL  Results for [name], [name] (MRN [id number]) as of 06/22/2021 11:25  Ref. Range 06/21/2021 23:06 06/22/2021 03:44 06/22/2021 07:55 06/22/2021 11:10  Glucose-Capillary Latest Ref Range: 70 - 99 mg/dL 312 (H)  7 units Novolog  163 (H)  4 units Novolog  184 (H)  4 units Novolog  40 units Semglee 233 (H)    Home DM Meds: Lantus 40 units BID       Novolog 0-20 units TID per SSI   Current Orders: Semglee 40 units BID     Novolog Resistant Correction Scale/ SSI (0-20 units) Q4 hours     MD- Note tube feeds started yesterday afternoon.  Tube feeds not at goal yet, but pt now having some glucose elevations.  Once tube feeds are at goal rate, please consider adding Novolog tube feed coverage:  Novolog 3 units Q4 hours to start  HOLD if tube feeds HELD for any reason    --Will follow patient during hospitalization--  Ambrose Finland RN, MSN, CDE Diabetes Coordinator Inpatient Glycemic Control Team Team Pager: [phone] (8a-5p)

## 2021-06-22 NOTE — Progress Notes (Signed)
ID Lauren Velazquez is a 60 y.o. female with a history of hypertension, DM, vertigo, HLD, anxiety, chronic pain on opioids,presents with altered mentation on 06/19/2021. The ED triage note says that she was screaming and yelling in the stretcher when she arrived and was sticking her finger down her throat in an attempt to make her self vomit.  She was yelling that she was in pain and needs to get off the stretcher.  She was complaining that she needed to take her pain medication and she was at pain clinic patient. In the ED physician assessed her her main complaint was chest pain and she was worried that she had a heart attack.  She also had bizarre behavior.  In the ED initially her temperature was 98, BP 191/102, heart rate of 104, sats of 96%.  Temperature later was as high as 104. Labs revealed WBC of 15.4, Hb 12.2, platelet 507.  Creatinine was 0.76.  Urine tox screen was negative. She underwent CT head and it was normal. Blood culture was sent Due to her increasingly agitated state in spite of benzodiazepine and the need for lumbar puncture it was decided to intubate her so as to protect airway as she needed further sedation.  She received 50 mg of Benadryl, 5 mg Haldol and 2 mg of Ativan x2.  10 mg of Decadron also was given. She underwent a lumbar puncture on 06/20/2021 at 2:37 AM.  It revealed a WBC of 22 with 9% neutrophils, lymphocytes of 23 and monocytes of 63.  Total protein was 45.  Glucose was 204.  RBC was 317. She has been covered for meningitis with vancomycin, ampicillin, ceftriaxone and acyclovir.  She is admitted to ICU Today I managed to speak to her daughter Lauren Velazquez who is not very close with her mom and says the mom takes pain meds. She feels her mom could have paronoia, anxiety Spoke to another daughter Lauren Velazquez [phone] Who says that according to ehr stepdad her mom was looking for something at home, and flipped out and he had to call ems Amber is not aware she had fever Amber  says she has 4 cats and a dog and the house smelt of ammonia Her step dad is old and poor eyesight Her mother used to work in a [employer], operational until a year ago and after covid she has not been there because of her breathing. As per the triage note EMS was called for chest pain, OSB, stroke, pain all over In the ED she was yelling and screaming and sticking her fingers in her throat to make herself vomit She had normal temp and them a temp of 104 and she had LP for which she was intubated because of need for sedation for her agitation Afebrile since hospitalization   O/e Intubated On propafol, preceex Low dose pressor B/l air entry Afebrile since 06/20/21 Edema of extremities   Labs CBC Latest Ref Rng & Units 06/22/2021 06/21/2021 06/20/2021  WBC 4.0 - 10.5 K/uL 12.3(H) 15.0(H) 13.9(H)  Hemoglobin 12.0 - 15.0 g/dL 5.2(D) 7.8(E) 10.4(L)  Hematocrit 36.0 - 46.0 % 31.0(L) 32.9(L) 34.0(L)  Platelets 150 - 400 K/uL 383 423(H) 419(H)     CMP Latest Ref Rng & Units 06/22/2021 06/21/2021 06/20/2021  Glucose 70 - 99 mg/dL 423(N) 361(W) 431(V)  BUN 6 - 20 mg/dL <4(M) 8 7  Creatinine 0.86 - 1.00 mg/dL 7.61(P) 5.09 3.26  Sodium 135 - 145 mmol/L 141 140 136  Potassium 3.5 - 5.1 mmol/L 3.1(L) 3.2(L) 4.2  Chloride 98 - 111 mmol/L 104 104 102  CO2 22 - 32 mmol/L 30 27 25   Calcium 8.9 - 10.3 mg/dL 8.3(L) 8.7(L) 8.5(L)  Total Protein 6.5 - 8.1 g/dL - 6.3(L) -  Total Bilirubin 0.3 - 1.2 mg/dL - 0.4 -  Alkaline Phos 38 - 126 U/L - 73 -  AST 15 - 41 U/L - 18 -  ALT 0 - 44 U/L - 13 -    Micro BC 06/19/21- NG 06/20/21 NG CSF 06/20/21 NG so far CSF cell count  22 ( 9% N) Protein 45 Glucose 204   MRI 1. Scattered punctate acute infarcts in both cerebral hemispheres (bilateral PCA and left MCA territories). No associated hemorrhage or mass effect. Although the posterior fossa and anterior cerebral artery territories are spared, the pattern is suspicious for a recent Embolic event. Hypotensive episode  might also produce this appearance.   2. 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    Impression/recommendation pt presenting with altered mental status and bizarre behavior- but when presenting to the ED she was awake but agitated, flipping recliner, screaming and shouting and asking for pain medications. Had to be intubated for airway protection for LP Fever with AMS- CSF mild pleocytosis- more lymphocytic With no history available from her and not much of a clear picture after talking to her daughters who do not see her that often the picture is still not clear D.D bacterial meningits less likely Need to r/o HSV encephalitis ( but MRI does not show any temporal lobe enhancement and EEG no PLEDS) But will continue acyclovir and triple antibiotics  to cover bacteria ( including listeria ) until csf culture and PCR are resulted. If HSV neg then can DC acyclovir, if csf culture neg and PCR CSF for meningitis /encephalitis panel then can discontinue ampicillin-    Other condition- Drug induced - like NMS /serotonin syndrome  in the D>D     MRI is abnormal suggestive of scattered punctate acute infarcts b/l 2 d echo N   Anemia   DM- Hba1c 8.9  Discussed the management with daughters  and care team [facility] covering remotely tomorrow

## 2021-06-22 NOTE — Progress Notes (Signed)
Nutrition Brief Follow Up Note   Propofol change   INTERVENTION:   Change to Vital HP @40ml /hr + ProSource TF 32ml BID via tube   Propofol @22ml /hr- Provides 580kcal/day   Free water flushes 48m q4 hours  Regimen provides 1620kcal/day, 106g/day protein and 1451ml/day of free water  Pt at high refeed risk; recommend monitor potassium, magnesium and phosphorus labs daily until stable  Liquid MVI daily via tube   Estimated Nutritional Needs:   Kcal:  1136-1446kcal/day  Protein:  >95g/day  Fluid:  1.4-1.7L/day  MS, RD, LDN Please refer to [facility] for RD and/or RD on-call/weekend/after hours pager

## 2021-06-22 NOTE — Progress Notes (Signed)
Pharmacy Antibiotic Note  Lauren Velazquez is a 60 y.o. female admitted on 06/19/2021 with meningitis.  Pharmacy has been consulted for Acyclovir and Vancomycin dosing for possible meningitis.  Patient presented with AMS and pain. CSF indicates lymphocytic plecocytosis with elevated glucose. MRI to rule out HSV encephalitis showing more of an embolic picture. Patient does have elevated systemic WBC. On day 3 of antibiotics and antivirals.   Plan: Acyclovir 10 mg/kg (Adjusted BW) q8hr per indication and renal function.  Vancomycin Pt given initial dose of Vancomycin 2 gm Continue vancomycin 750 mg IV Q 12 hrs.  Trough based dosing for meningitis with goal trough 15-20. Ordered trough for 11/8 @2000 . Obtain trough prior to next vancomycin dose.  Also on ceftriaxone 2 grams every 12 hours per meningitis dosing, and ampicillin 2 grams every 4 hours for Listeria coverage (age > 75 yo)  Follow clinical course, renal function, and levels at steady state.   Height: 5\' 1"  (154.9 cm) Weight: 103.9 kg (229 lb 0.9 oz) IBW/kg (Calculated) : 47.8  Temp (24hrs), Avg:98.5 F (36.9 C), Min:97.9 F (36.6 C), Max:99 F (37.2 C)  Recent Labs  Lab 06/19/21 1722 06/19/21 2257 06/20/21 0052 06/20/21 0500 06/20/21 0733 06/20/21 1831 06/21/21 0524 06/21/21 1647 06/22/21 0448  WBC 14.4*  --   --  13.9*  --   --   --  15.0* 12.3*  CREATININE 0.76  --   --   --  0.69  --  0.56  --  0.43*  LATICACIDVEN  --  4.5* 3.4*  --  3.9* 1.4  --   --   --      Estimated Creatinine Clearance: 84.9 mL/min (A) (by C-G formula based on SCr of 0.43 mg/dL (L)).    Allergies  Allergen Reactions   Atorvastatin Swelling    Antimicrobials this admission: 11/6 Vancomycin >>  11/6 Ampicillin >> 11/6 Ceftriaxone >> 11/6 Acyclovir >> 11/6 Cefepime >> x 1 dose  Microbiology results: 11/6 BCx: NG x 1 d 11/6 CSF Cx: No organisms. WBC seen, RBC present  Thank you for allowing pharmacy to be a part of this  patient's care.  13/6, PharmD Pharmacy Resident  06/22/2021 3:57 PM

## 2021-06-22 NOTE — Consult Note (Signed)
PHARMACY CONSULT NOTE  Pharmacy Consult for Electrolyte Monitoring and Replacement   Recent Labs: Potassium (mmol/L)  Date Value  06/22/2021 3.1 (L)  11/19/2014 3.8   Magnesium (mg/dL)  Date Value  75/44/9201 2.0   Calcium (mg/dL)  Date Value  00/71/2197 8.3 (L)   Calcium, Total (mg/dL)  Date Value  58/83/2549 9.4   Albumin (g/dL)  Date Value  82/64/1583 2.9 (L)  11/19/2014 3.9   Phosphorus (mg/dL)  Date Value  09/40/7680 2.5   Sodium (mmol/L)  Date Value  06/22/2021 141  11/19/2014 139   Assessment: Patient is a 60 y/o F with medical history including diabetes, HTN, vertigo, HLD, anxiety, chronic back pain who presented to the ED 11/5 with altered mentation. Patient subsequently found to have elevated lactic acid and leukocytosis without obvious source. There is concern for meningitis and lumbar puncture was performed and patient started on broad spectrum antimicrobial coverage. Patient ultimately required intubation for airway protection due to worsening agitation. Patient is currently admitted to the ICU. Pharmacy consulted to assist with electrolyte monitoring and replacement as indicated.  Goal of Therapy:  Electrolytes within normal limits  Plan:  --Received Lasix 20 mg IV today --Potassium 40 mEq per tube x 2 doses --Follow-up electrolytes with morning labs  Pricilla Riffle, PharmD, BCPS Clinical Pharmacist 06/22/2021 2:50 PM

## 2021-06-22 NOTE — Progress Notes (Signed)
Pharmacy Antibiotic Note  Lauren Velazquez is a 60 y.o. female admitted on 06/19/2021 with meningitis.  Pharmacy has been consulted for Acyclovir and Vancomycin dosing for possible meningitis.  Patient presented with AMS and pain. CSF indicates lymphocytic plecocytosis with elevated glucose. MRI to rule out HSV encephalitis showing more of an embolic picture. Patient does have elevated systemic WBC. On day 3 of antibiotics and antivirals.   Plan: Acyclovir 10 mg/kg (Adjusted BW) q8hr per indication and renal function.  Vancomycin Vancomycin trough returned at 9 (~ 11 hour post dose). Will increase vancomycin dose to 1250 mg BID to aim for a tough of 15-20. Pt's BMI > 30 which puts the pt at risk for accumulation. Continue to monitor renal function and vancomycin levels. Recommend obtaining level prior to the 4th dose.    Also on ceftriaxone 2 grams every 12 hours per meningitis dosing, and ampicillin 2 grams every 4 hours for Listeria coverage (age > 25 yo)  Follow clinical course, renal function, and levels at steady state.   Height: 5\' 1"  (154.9 cm) Weight: 103.9 kg (229 lb 0.9 oz) IBW/kg (Calculated) : 47.8  Temp (24hrs), Avg:98.6 F (37 C), Min:98.2 F (36.8 C), Max:99 F (37.2 C)  Recent Labs  Lab 06/19/21 1722 06/19/21 2257 06/20/21 0052 06/20/21 0500 06/20/21 0733 06/20/21 1831 06/21/21 0524 06/21/21 1647 06/22/21 0448 06/22/21 1958  WBC 14.4*  --   --  13.9*  --   --   --  15.0* 12.3*  --   CREATININE 0.76  --   --   --  0.69  --  0.56  --  0.43*  --   LATICACIDVEN  --  4.5* 3.4*  --  3.9* 1.4  --   --   --   --   VANCOTROUGH  --   --   --   --   --   --   --   --   --  9*     Estimated Creatinine Clearance: 84.9 mL/min (A) (by C-G formula based on SCr of 0.43 mg/dL (L)).    Allergies  Allergen Reactions   Atorvastatin Swelling    Antimicrobials this admission: 11/6 Vancomycin >>  11/6 Ampicillin >> 11/6 Ceftriaxone >> 11/6 Acyclovir >> 11/6 Cefepime >> x  1 dose  Microbiology results: 11/6 BCx: NG x 1 d 11/6 CSF Cx: No organisms. WBC seen, RBC present  Thank you for allowing pharmacy to be a part of this patient's care.  13/6, PharmD Pharmacy Resident  06/22/2021 10:09 PM

## 2021-06-23 DIAGNOSIS — R410 Disorientation, unspecified: Secondary | ICD-10-CM | POA: Diagnosis not present

## 2021-06-23 LAB — RENAL FUNCTION PANEL
Albumin: 2.5 g/dL — ABNORMAL LOW (ref 3.5–5.0)
Anion gap: 6 (ref 5–15)
BUN: 8 mg/dL (ref 6–20)
CO2: 30 mmol/L (ref 22–32)
Calcium: 8.1 mg/dL — ABNORMAL LOW (ref 8.9–10.3)
Chloride: 106 mmol/L (ref 98–111)
Creatinine, Ser: 0.45 mg/dL (ref 0.44–1.00)
GFR, Estimated: >60 mL/min (ref >60)
Glucose, Bld: 145 mg/dL — ABNORMAL HIGH (ref 70–99)
Phosphorus: 3.4 mg/dL (ref 2.5–4.6)
Potassium: 3.5 mmol/L (ref 3.5–5.1)
Sodium: 142 mmol/L (ref 135–145)

## 2021-06-23 LAB — HSV 1/2 PCR, CSF
HSV-1 DNA: NEGATIVE
HSV-2 DNA: NEGATIVE

## 2021-06-23 LAB — GLUCOSE, CAPILLARY
Glucose-Capillary: 128 mg/dL — ABNORMAL HIGH (ref 70–99)
Glucose-Capillary: 135 mg/dL — ABNORMAL HIGH (ref 70–99)
Glucose-Capillary: 148 mg/dL — ABNORMAL HIGH (ref 70–99)
Glucose-Capillary: 160 mg/dL — ABNORMAL HIGH (ref 70–99)
Glucose-Capillary: 187 mg/dL — ABNORMAL HIGH (ref 70–99)
Glucose-Capillary: 222 mg/dL — ABNORMAL HIGH (ref 70–99)

## 2021-06-23 LAB — CSF CULTURE W GRAM STAIN
Culture: NO GROWTH
Gram Stain: NONE SEEN

## 2021-06-23 MED ORDER — PROSOURCE TF PO LIQD
90.0000 mL | Freq: Two times a day (BID) | ORAL | Status: DC
  Administered 2021-06-23 – 2021-06-24 (×2): 90 mL
  Filled 2021-06-23 (×3): qty 90

## 2021-06-23 MED ORDER — VITAL HIGH PROTEIN PO LIQD: 1000.0000 mL | ORAL | Status: DC

## 2021-06-23 NOTE — Progress Notes (Signed)
Nutrition Follow-up  DOCUMENTATION CODES:   Obesity unspecified  INTERVENTION:   Continue TF via OGT:   Vital High Protein @ 25 ml/hr (600 ml daily)  90 ml Prosource TF BID.    15 ml liquid MVI daily  100 ml free water flush every 4 hours  Tube feeding regimen provides 644 kcal, 96 grams of protein, and 502 ml of H2O.    TF + propofol to provide 1473 kcals  NUTRITION DIAGNOSIS:   Inadequate oral intake related to inability to eat (pt sedated and ventilated) as evidenced by NPO status.  Ongoing  GOAL:   Provide needs based on ASPEN/SCCM guidelines  Progressing   MONITOR:   Vent status, Labs, Weight trends, TF tolerance, Skin, I & O's  REASON FOR ASSESSMENT:   Consult Assessment of nutrition requirement/status  ASSESSMENT:   60 y/o female with h/o DM, HTN, HLD, anxiety and COVID 19 who is admitted with sepsis and suspected meningitis.  Patient is currently intubated on ventilator support MV: 10.6 L/min Temp (24hrs), Avg:98.7 F (37.1 C), Min:97.7 F (36.5 C), Max:99.7 F (37.6 C)  Reviewed I/O's: +1.9 L x 24 hours and +7 L since admission  UOP: 2.2 L x 24 hours  MAP: 83   Propofol: 31.4 ml/hr (provides 829 kcals daily)   Case discussed with MD, RN, and during ICU rounds.   Per RN, pt tolerating feedings well.   MRI of brain was abnormal and revealed scattered infarcts. Plan for ECHO and naeurology consult. Concern for brain injury.   Medications reviewed and include colace and miralax.   Labs reviewed: CBGS: 148-189 (inpatient orders for glycemic control are 0-20 units insulin aspart every 4 hours and 40 units insulin glargine-yfgn BID).    Diet Order:   Diet Order             Diet NPO time specified  Diet effective now                   EDUCATION NEEDS:   No education needs have been identified at this time  Skin:  Skin Assessment: Reviewed RN Assessment  Last BM:  Unknown  Height:   Ht Readings from Last 1 Encounters:   06/20/21 5\' 1"  (1.549 m)    Weight:   Wt Readings from Last 1 Encounters:  06/23/21 104.4 kg    Ideal Body Weight:  47.7 kg  BMI:  Body mass index is 43.49 kg/m.  Estimated Nutritional Needs:   Kcal:  1136-1446kcal/day  Protein:  >95g/day  Fluid:  1.4-1.7L/day    13/09/22, RD, LDN, CDCES Registered Dietitian II Certified Diabetes Care and Education Specialist Please refer to AMION for RD and/or RD on-call/weekend/after hours pager

## 2021-06-23 NOTE — Consult Note (Signed)
PHARMACY CONSULT NOTE  Pharmacy Consult for Electrolyte Monitoring and Replacement   Recent Labs: Potassium (mmol/L)  Date Value  06/23/2021 3.5  11/19/2014 3.8   Magnesium (mg/dL)  Date Value  78/67/5449 2.0   Calcium (mg/dL)  Date Value  20/05/711 8.1 (L)   Calcium, Total (mg/dL)  Date Value  19/75/8832 9.4   Albumin (g/dL)  Date Value  54/98/2641 2.5 (L)  11/19/2014 3.9   Phosphorus (mg/dL)  Date Value  58/30/9407 3.4   Sodium (mmol/L)  Date Value  06/23/2021 142  11/19/2014 139   Assessment: Patient is a 60 y/o F with medical history including diabetes, HTN, vertigo, HLD, anxiety, chronic back pain who presented to the ED 11/5 with altered mentation. Patient subsequently found to have elevated lactic acid and leukocytosis without obvious source. There is concern for meningitis and lumbar puncture was performed and patient started on broad spectrum antimicrobial coverage. Patient ultimately required intubation for airway protection due to worsening agitation. Patient is currently admitted to the ICU. Pharmacy consulted to assist with electrolyte monitoring and replacement as indicated.  Goal of Therapy:  Electrolytes within normal limits  Plan:  --No replacement indicated today --Follow-up electrolytes with morning labs  Pricilla Riffle, PharmD, BCPS Clinical Pharmacist 06/23/2021 1:00 PM

## 2021-06-23 NOTE — Progress Notes (Signed)
NAME:  Lauren Velazquez, MRN:  [id number], DOB:  19-Mar-1961, LOS: 3 ADMISSION DATE:  06/19/2021 CONSULTATION DATE:  06/19/2021  REFERRING MD:  Dr. Leonides Schanz  CHIEF COMPLAINT:  Altered Mental Status    BRIEF SYNOPSIS: 60 y/o F with PMH  History of Present Illness:  60 year old female presenting to [facility] ED from home via EMS for multiple complaints including stroke, chest pain, shortness of breath and pain all over. (Per ED documentation)  ED course: Per ED documentation upon arrival patient was agitated and extremely altered, exhibiting bizarre behavior.  She was screaming and yelling sticking her finger down her throat and attempt to make her self vomit.  While sitting in a recliner began attempting to flip it over eventually requiring physical restraint for her own safety.  ED staff attempted to sedate her as she was kicking and swinging arms at staff and husband.  Her main complaint to the ED provider was chest pain, she stated she was worried that she might have had a heart attack as well as feeling very dehydrated.  She also insisted on being given her chronic pain medication, however she also told staff she will take all her pills and overdose. Patient was placed under IVC.  Per ED documentation, husband denied recreational drug use or any new medications (unclear if this comment was made to EDP or EMS).  While in the ED patient became febrile and sepsis protocol was initiated.  No definitive source of infection noted in CXR/UA, this in combination with severely altered mental status raise concern for meningitis.  Due to patient's increasingly agitated state refractory to benzodiazepines the decision was made by the EDP to mechanically intubate for airway protection in order to provide further sedation so that an LP could be performed safely  Medications given: 50 mg Benadryl, 5 mg Haldol, 2 mg Ativan x2, cefepime/vancomycin/acyclovir, 10 mg of Decadron, total 2.6 L IV fluid bolus, succinylcholine for RSI,  650 mg acetaminophen for fever Initial Vitals: Severely febrile at 103.9, tachypneic 32, tachycardic 134, hypertensive urgency 232/97 & 95% on room air EKG Interpretation: Date: 06/19/2021, EKG Time: 23:23, Rate: 121, Rhythm: Sinus tachycardia, QRS Axis: RAD, Intervals: Prolonged QTC, ST/T Wave abnormalities: None, Narrative Interpretation: Sinus tachycardia with RAD Chemistry: Na+: 134, K+: 3.9, BUN/Cr.:  9/0.76, Serum CO2/ AG: 28/9 Hematology: WBC: 14.4, Hgb: 12.2,  Normal troponin: 8> 15,  Lactic/ PCT: 4.5/pending, COVID-19 & Influenza A/B: negative ABG: 7.46/32/177/22.8 CXR 06/20/21: mild central vascular congestion CT head wo contrast 06/20/21: no acute intracranial abnormality   PCCM consulted for admission due to need for emergent intubation for airway protection and mechanical ventilatory support.  Pertinent  Medical History  T2DM HTN Vertigo HLD Anxiety Chronic Back pain COVID-19   Significant Hospital Events: Including procedures, antibiotic start and stop dates in addition to other pertinent events   06/20/21- Admit to ICU s/p mechanical intubation for airway protection in the setting of increased sedation needs due to continued agitation s/t suspected meningitis 06/21/21- Intubated and sedation, agitated when sedation was weaned today. MRI w/out contrast shows acute infarcts (embolic vs. Hypotensive). ID, neuro, and psych consulted.   06/22/2021-persistent agitation off of sedation.  Started Depakene for mood stabilization and cyproheptadine for potential serotonergic symptoms. 11/9 remains intubated  Micro Data:  11/6>> CSF- mild pleocytosis   Antimicrobials:  11/6 Cefepime >> x 1 dose 11/6 Vancomycin >>  11/6 Ampicillin >> 11/6 Ceftriaxone >> 11/6 Acyclovir >>  Interim History / Subjective:  Severe encephalopathy started on Depakote Severe resp failure  Inability to protect airway   Objective   Blood pressure 133/66, pulse 61, temperature 98.4 F (36.9 C), resp.  rate 18, height _0  (1.549 m), weight 104.4 kg, last menstrual period 08/15/2008, SpO2 98 %.    Vent Mode: PRVC FiO2 (%):  [28 %-30 %] 28 % Set Rate:  [18 bmp] 18 bmp Vt Set:  [450 mL] 450 mL PEEP:  [5 cmH20] 5 cmH20   Intake/Output Summary (Last 24 hours) at 06/23/2021 3329 Last data filed at 06/23/2021 0600 Gross per 24 hour  Intake 4084.57 ml  Output 2200 ml  Net 1884.57 ml    Filed Weights   06/21/21 0500 06/22/21 0327 06/23/21 0415  Weight: 103.3 kg 103.9 kg 104.4 kg     REVIEW OF SYSTEMS  PATIENT IS UNABLE TO PROVIDE COMPLETE REVIEW OF SYSTEMS DUE TO SEVERE CRITICAL ILLNESS AND TOXIC METABOLIC ENCEPHALOPATHY    PHYSICAL EXAMINATION:  GENERAL:critically ill appearing, +resp distress EYES: Pupils equal, round, reactive to light.  No scleral icterus.  MOUTH: Moist mucosal membrane. INTUBATED NECK: Supple.  PULMONARY: +rhonchi, +wheezing CARDIOVASCULAR: S1 and S2.  No murmurs  GASTROINTESTINAL: Soft, nontender, -distended. Positive bowel sounds.  MUSCULOSKELETAL: No swelling, clubbing, or edema.  NEUROLOGIC: obtunded SKIN:intact,warm,dry     Labs/imaging that I havepersonally reviewed  (right click and "Reselect all SmartList Selections" daily)  CXR 06/20/21: mild central vascular congestion CT head wo contrast 06/20/21: no acute intracranial abnormality CT abd/pelvis 06/20/21: No evidence of inflammatory process or abscess. Evidence of cholelithiasis, bilateral nephrolithiasis, 5.4 cm benign-appearing left adnexal mass.   CSF 06/20/21: mild pleocytosis MR brain w/out 06/21/21: Scattered punctate acute infarcts in both cerebral hemispheres (bilateral PCA and left MCA territories). No associated hemorrhage or mass effect. Although the posterior fossa and anterior cerebral artery territories are spared, the pattern is suspicious for a recent embolic event. Hypotensive episode might also produce this appearance. Small chronic lacunar infarct in the left caudate nucleus,  and mild for age periatrial white matter signal changes.  Echo 06/21/21: EF 60-65%, otherwise normal.  MRI BRAIN 11/7 Scattered punctate acute infarcts in both cerebral hemispheres (bilateral PCA and left MCA territories). No associated hemorrhage or mass effect.  Resolved Hospital Problem list    ASSESSMENT AND PLAN  Sepsis without septic shock due to suspected meningitis versus viral illness Lactic: 4.5-->3.9>> 1.4, Baseline PCT: <0.10, UA: +ketones +protein, CXR: mild central vascular congestion, CTH: negative, LP: mild pleocytosis  - Initial interventions/workup included: 1.6 L of LR & 1 L NS & Cefepime/ Vancomycin/ acyclovir - Supplemental oxygen as needed, to maintain SpO2 > 90% - f/u cultures, trend lactic/ PCT, f/u CK level - Daily CBC, monitor WBC/ fever curve - IV antibiotics for meningitis coverage: ceftriaxone, vancomycin, ampicillin & acyclovir    Fever - Resolved - ? Viral illness - ? Serotonin syndrome  Severe ACUTE Hypoxic and Hypercapnic Respiratory Failurefor airway protection secondary to sedation requirements in the setting of severe agitation & encephalopathy, +BRAIN DAMAGE  -continue Mechanical Ventilator support -continue Bronchodilator Therapy -Wean Fio2 and PEEP as tolerated -VAP/VENT bundle implementation -will perform SAT/SBT when respiratory parameters are met   Acute Encephalopathy secondary to suspected meningitis +BRAIN DAMAGE ON MRI bilateral scattered strokes R/O Serotonin syndrome, manic episode PMHx: Anxiety, chronic pain (w/ opioid dependence)  - supportive care - Appreciate input from psychiatry (cannot rule out manic episode) - Per ID- CSF shows mild pleocytosis (lymphatic), unlikely bacterial (continue abx & acyclovir for now), discontinue antibiotics if CSF studies negative. - neurology consult- appreciate input  for MRI and CSF findings -Start Depakene for mood stabilization, cyproheptadine for potential serotonin excess issues     ENDO - ICU hypoglycemic\Hyperglycemia protocol -check FSBS per protocol  ELECTROLYTES -follow labs as needed -replace as needed -pharmacy consultation and following   Best practice (right click and "Reselect all SmartList Selections" daily)  Diet: Begin TF Pain/Anxiety/Delirium protocol (if indicated): Yes (RASS goal -1) VAP protocol (if indicated): Yes DVT prophylaxis: SCD GI prophylaxis: H2B Glucose control:  SSI Yes Central venous access:  Yes, and it is still needed Arterial line:  N/A Foley:  Yes, and it is still needed Mobility:  bed rest  Code Status:  FULL Disposition:ICU  Labs   CBC: Recent Labs  Lab 06/19/21 1722 06/20/21 0500 06/21/21 1647 06/22/21 0448  WBC 14.4* 13.9* 15.0* 12.3*  NEUTROABS 12.9*  --   --   --   HGB 12.2 10.4* 9.9* 9.4*  HCT 39.4 34.0* 32.9* 31.0*  MCV 80.4 81.7 81.2 80.7  PLT 507* 419* 423* 383     Basic Metabolic Panel: Recent Labs  Lab 06/19/21 1722 06/20/21 0135 06/20/21 0500 06/20/21 0733 06/21/21 0524 06/22/21 0448 06/23/21 0509  NA 134*  --   --  136 140 141 142  K 3.9  --   --  4.2 3.2* 3.1* 3.5  CL 97*  --   --  102 104 104 106  CO2 28  --   --  _0 GLUCOSE 386*  --   --  269* 152* 182* 145*  BUN 9  --   --  7 8 <5* 8  CREATININE 0.76  --   --  0.69 0.56 0.43* 0.45  CALCIUM 9.4  --   --  8.5* 8.7* 8.3* 8.1*  MG  --  2.0 1.7  --  2.0 2.0  --   PHOS  --  1.2* 3.3  --  3.0 2.5 3.4    GFR: Estimated Creatinine Clearance: 85.2 mL/min (by C-G formula based on SCr of 0.45 mg/dL). Recent Labs  Lab 06/19/21 1722 06/19/21 2257 06/20/21 0052 06/20/21 0500 06/20/21 0733 06/20/21 1831 06/21/21 1647 06/22/21 0448  PROCALCITON  --  <0.10  --   --   --   --   --   --   WBC 14.4*  --   --  13.9*  --   --  15.0* 12.3*  LATICACIDVEN  --  4.5* 3.4*  --  3.9* 1.4  --   --      Liver Function Tests: Recent Labs  Lab 06/19/21 2257 06/21/21 0524 06/23/21 0509  AST 24 18  --   ALT 13 13  --   ALKPHOS 90 73   --   BILITOT 0.7 0.4  --   PROT 7.3 6.3*  --   ALBUMIN 3.6 2.9* 2.5*    No results for input(s): LIPASE, AMYLASE in the last 168 hours. Recent Labs  Lab 06/20/21 0135  AMMONIA 35     ABG    Component Value Date/Time   PHART 7.46 (H) 06/20/2021 0204   PCO2ART 32 06/20/2021 0204   PO2ART 177 (H) 06/20/2021 0204   HCO3 22.8 06/20/2021 0204   ACIDBASEDEF 0.5 06/20/2021 0204   O2SAT 99.6 06/20/2021 0204      Coagulation Profile: Recent Labs  Lab 06/20/21 0135  INR 1.1     Cardiac Enzymes: Recent Labs  Lab 06/20/21 0135 06/20/21 0500 06/21/21 0524  CKTOTAL 393* 473* 210     HbA1C:  Hgb A1c MFr Bld  Date/Time Value Ref Range Status  06/20/2021 05:00 AM 8.2 (H) 4.8 - 5.6 % Final    Comment:    (NOTE) Pre diabetes:          5.7%-6.4%  Diabetes:              >6.4%  Glycemic control for   <7.0% adults with diabetes   03/06/2020 07:05 AM 9.1 (H) 4.8 - 5.6 % Final    Comment:    (NOTE)         Prediabetes: 5.7 - 6.4         Diabetes: >6.4         Glycemic control for adults with diabetes: <7.0     CBG: Recent Labs  Lab 06/22/21 1110 06/22/21 1540 06/22/21 1927 06/22/21 2356 06/23/21 0352  GLUCAP 233* 175* 108* 189* 148*         Continuous Infusions:  sodium chloride Stopped (06/21/21 1536)   acyclovir 720 mg (06/23/21 0507)   ampicillin (OMNIPEN) IV 2 g (06/23/21 0413)   cefTRIAXone (ROCEPHIN)  IV 2 g (06/22/21 2238)   dexmedetomidine (PRECEDEX) IV infusion Stopped (06/22/21 0155)   famotidine (PEPCID) IV 20 mg (06/22/21 2103)   fentaNYL infusion INTRAVENOUS 175 mcg/hr (06/23/21 0600)   norepinephrine (LEVOPHED) Adult infusion 5 mcg/min (06/23/21 0612)   propofol (DIPRIVAN) infusion 35 mcg/kg/min (06/23/21 0600)   vancomycin 1,250 mg (06/22/21 2326)   PRN Meds:.sodium chloride, albuterol, diclofenac Sodium, fentaNYL, hydrALAZINE, ibuprofen, midazolam, polyethylene glycol      DVT/GI PRX  assessed I Assessed the need for Labs I  Assessed the need for Foley I Assessed the need for Central Venous Line Family Discussion when available I Assessed the need for Mobilization I made an Assessment of medications to be adjusted accordingly Safety Risk assessment completed  CASE DISCUSSED IN MULTIDISCIPLINARY ROUNDS WITH ICU TEAM     Critical Care Time devoted to patient care services described in this note is 55 minutes.  Critical care was necessary to treat /prevent imminent and life-threatening deterioration. Overall, patient is critically ill, prognosis is guarded.  Patient with Multiorgan failure and at high risk for cardiac arrest and death.   Patient will likely need TRACH and PEG, need to conatct family Palliative care consultation  Kable Haywood Patricia Pesa, M.D.  [facility] Pulmonary & Critical Care Medicine  Medical Director [place] Director [facility] Cardio-Pulmonary Department

## 2021-06-23 NOTE — Progress Notes (Signed)
GOALS OF CARE DISCUSSION  The Clinical status was relayed to family in detail. Lauren Velazquez the husband Updated and notified of patients medical condition.    Patient remains unresponsive and will not open eyes to command.   Patient is having a weak cough and struggling to remove secretions.   Patient with increased WOB and using accessory muscles to breathe Explained to family course of therapy and the modalities    Patient with Progressive multiorgan failure with a very high probablity of a very minimal chance of meaningful recovery despite all aggressive and optimal medical therapy.  PATIENT REMAINS FULL CODE  Family understands the situation.  Family are satisfied with Plan of action and management. All questions answered  Additional CC time 35 mins   Lauren Velazquez, M.D.  [facility]  Medical Director [facility] Medical Director [facility] Cardio-Pulmonary Department

## 2021-06-24 ENCOUNTER — Inpatient Hospital Stay: Payer: BLUE CROSS/BLUE SHIELD

## 2021-06-24 DIAGNOSIS — I6310 Cerebral infarction due to embolism of unspecified precerebral artery: Secondary | ICD-10-CM

## 2021-06-24 DIAGNOSIS — R410 Disorientation, unspecified: Secondary | ICD-10-CM | POA: Diagnosis not present

## 2021-06-24 DIAGNOSIS — G9340 Encephalopathy, unspecified: Secondary | ICD-10-CM | POA: Diagnosis not present

## 2021-06-24 LAB — BASIC METABOLIC PANEL
Anion gap: 6 (ref 5–15)
BUN: 9 mg/dL (ref 6–20)
CO2: 31 mmol/L (ref 22–32)
Calcium: 8.2 mg/dL — ABNORMAL LOW (ref 8.9–10.3)
Chloride: 108 mmol/L (ref 98–111)
Creatinine, Ser: 0.45 mg/dL (ref 0.44–1.00)
GFR, Estimated: >60 mL/min (ref >60)
Glucose, Bld: 172 mg/dL — ABNORMAL HIGH (ref 70–99)
Potassium: 3.5 mmol/L (ref 3.5–5.1)
Sodium: 145 mmol/L (ref 135–145)

## 2021-06-24 LAB — CBC
HCT: 29.1 % — ABNORMAL LOW (ref 36.0–46.0)
Hemoglobin: 8.6 g/dL — ABNORMAL LOW (ref 12.0–15.0)
MCH: 24.9 pg — ABNORMAL LOW (ref 26.0–34.0)
MCHC: 29.6 g/dL — ABNORMAL LOW (ref 30.0–36.0)
MCV: 84.1 fL (ref 80.0–100.0)
Platelets: 328 10*3/uL (ref 150–400)
RBC: 3.46 MIL/uL — ABNORMAL LOW (ref 3.87–5.11)
RDW: 16.1 % — ABNORMAL HIGH (ref 11.5–15.5)
WBC: 8.1 10*3/uL (ref 4.0–10.5)
nRBC: 0 % (ref 0.0–0.2)

## 2021-06-24 LAB — GLUCOSE, CAPILLARY
Glucose-Capillary: 124 mg/dL — ABNORMAL HIGH (ref 70–99)
Glucose-Capillary: 151 mg/dL — ABNORMAL HIGH (ref 70–99)
Glucose-Capillary: 163 mg/dL — ABNORMAL HIGH (ref 70–99)
Glucose-Capillary: 176 mg/dL — ABNORMAL HIGH (ref 70–99)
Glucose-Capillary: 185 mg/dL — ABNORMAL HIGH (ref 70–99)
Glucose-Capillary: 194 mg/dL — ABNORMAL HIGH (ref 70–99)

## 2021-06-24 LAB — CULTURE, BLOOD (ROUTINE X 2)
Culture: NO GROWTH
Special Requests: ADEQUATE

## 2021-06-24 LAB — VITAMIN B1: Vitamin B1 (Thiamine): 177.7 nmol/L (ref 66.5–200.0)

## 2021-06-24 LAB — TRIGLYCERIDES: Triglycerides: 146 mg/dL (ref <150)

## 2021-06-24 MED ORDER — PROSOURCE TF PO LIQD
45.0000 mL | Freq: Three times a day (TID) | ORAL | Status: DC
  Administered 2021-06-24 – 2021-06-25 (×5): 45 mL
  Filled 2021-06-24: qty 45

## 2021-06-24 MED ORDER — CLOPIDOGREL BISULFATE 75 MG PO TABS
75.0000 mg | ORAL_TABLET | Freq: Every day | ORAL | Status: DC
  Administered 2021-06-24 – 2021-06-25 (×2): 75 mg
  Filled 2021-06-24 (×3): qty 1

## 2021-06-24 MED ORDER — FREE WATER
150.0000 mL | Status: DC
  Administered 2021-06-24 – 2021-06-26 (×11): 150 mL

## 2021-06-24 MED ORDER — VITAL HIGH PROTEIN PO LIQD
1000.0000 mL | ORAL | Status: DC
  Administered 2021-06-24 – 2021-06-25 (×3): 1000 mL

## 2021-06-24 MED ORDER — ASPIRIN 81 MG PO CHEW
81.0000 mg | CHEWABLE_TABLET | Freq: Every day | ORAL | Status: DC
  Administered 2021-06-24 – 2021-06-25 (×2): 81 mg
  Filled 2021-06-24 (×3): qty 1

## 2021-06-24 MED ORDER — GADOBUTROL 1 MMOL/ML IV SOLN
10.0000 mL | Freq: Once | INTRAVENOUS | Status: AC | PRN
  Administered 2021-06-24: 10 mL via INTRAVENOUS

## 2021-06-24 NOTE — Progress Notes (Signed)
NAME:  Lauren Velazquez, MRN:  [id number], DOB:  09/30/1960, LOS: 4 ADMISSION DATE:  06/19/2021 CONSULTATION DATE:  06/19/2021  REFERRING MD:  Dr. Leonides Schanz  CHIEF COMPLAINT:  Altered Mental Status    BRIEF SYNOPSIS: 60 y/o F with PMH  History of Present Illness:  60 year old female presenting to [facility] ED from home via EMS for multiple complaints including stroke, chest pain, shortness of breath and pain all over. (Per ED documentation)  ED course: Per ED documentation upon arrival patient was agitated and extremely altered, exhibiting bizarre behavior.  She was screaming and yelling sticking her finger down her throat and attempt to make her self vomit.  While sitting in a recliner began attempting to flip it over eventually requiring physical restraint for her own safety.  ED staff attempted to sedate her as she was kicking and swinging arms at staff and husband.  Her main complaint to the ED provider was chest pain, she stated she was worried that she might have had a heart attack as well as feeling very dehydrated.  She also insisted on being given her chronic pain medication, however she also told staff she will take all her pills and overdose. Patient was placed under IVC.  Per ED documentation, husband denied recreational drug use or any new medications (unclear if this comment was made to EDP or EMS).  While in the ED patient became febrile and sepsis protocol was initiated.  No definitive source of infection noted in CXR/UA, this in combination with severely altered mental status raise concern for meningitis.  Due to patient's increasingly agitated state refractory to benzodiazepines the decision was made by the EDP to mechanically intubate for airway protection in order to provide further sedation so that an LP could be performed safely  Medications given: 50 mg Benadryl, 5 mg Haldol, 2 mg Ativan x2, cefepime/vancomycin/acyclovir, 10 mg of Decadron, total 2.6 L IV fluid bolus, succinylcholine for RSI,  650 mg acetaminophen for fever Initial Vitals: Severely febrile at 103.9, tachypneic 32, tachycardic 134, hypertensive urgency 232/97 & 95% on room air EKG Interpretation: Date: 06/19/2021, EKG Time: 23:23, Rate: 121, Rhythm: Sinus tachycardia, QRS Axis: RAD, Intervals: Prolonged QTC, ST/T Wave abnormalities: None, Narrative Interpretation: Sinus tachycardia with RAD Chemistry: Na+: 134, K+: 3.9, BUN/Cr.:  9/0.76, Serum CO2/ AG: 28/9 Hematology: WBC: 14.4, Hgb: 12.2,  Normal troponin: 8> 15,  Lactic/ PCT: 4.5/pending, COVID-19 & Influenza A/B: negative ABG: 7.46/32/177/22.8 CXR 06/20/21: mild central vascular congestion CT head wo contrast 06/20/21: no acute intracranial abnormality   PCCM consulted for admission due to need for emergent intubation for airway protection and mechanical ventilatory support.  Pertinent  Medical History  T2DM HTN Vertigo HLD Anxiety Chronic Back pain COVID-19   Significant Hospital Events: Including procedures, antibiotic start and stop dates in addition to other pertinent events   06/20/21- Admit to ICU s/p mechanical intubation for airway protection in the setting of increased sedation needs due to continued agitation s/t suspected meningitis 06/21/21- Intubated and sedation, agitated when sedation was weaned today. MRI w/out contrast shows acute infarcts (embolic vs. Hypotensive). ID, neuro, and psych consulted.   06/22/2021-persistent agitation off of sedation.  Started Depakene for mood stabilization and cyproheptadine for potential serotonergic symptoms. 11/9 remains intubated 11/10: remains intubated and sedated. Goal to wean sedation today as able, on minimal vent settings.   Micro Data:  11/6>> CSF- mild pleocytosis. Culture- no growth at 4 days. VDRL- negative.  11/6>> blood cultures- negative (no growth $RemoveBe'@4d'DgiighAnN$ ) 11/6>> MRSA PCR -  negative  11/6>> HSV1/2- negative  11/6>> Crypotococcal Ag - negative   Antimicrobials:  11/6 Cefepime >> x 1 dose 11/6  Vancomycin >>  11/6 Ampicillin >> 11/6 Ceftriaxone >> 11/6 Acyclovir >> 11/10 (HSV PCR negative)   Interim History / Subjective:  Patient remains intubated and sedated today. Goal to wean sedation, change to precedex from fentanyl & propofol as able. Currently on minimal ventilatory support. Appreciate input from neurology and ID.   Objective   Blood pressure (!) 158/65, pulse 81, temperature 99.3 F (37.4 C), temperature source Esophageal, resp. rate 17, height $RemoveBe'5\' 1"'nviPWzfKC$  (1.549 m), weight 108 kg, last menstrual period 08/15/2008, SpO2 97 %.    Vent Mode: PRVC FiO2 (%):  [28 %] 28 % Set Rate:  [18 bmp] 18 bmp Vt Set:  [450 mL] 450 mL PEEP:  [5 cmH20] 5 cmH20 Plateau Pressure:  [22 cmH20] 22 cmH20   Intake/Output Summary (Last 24 hours) at 06/24/2021 0837 Last data filed at 06/24/2021 0600 Gross per 24 hour  Intake 3352.38 ml  Output 1775 ml  Net 1577.38 ml   Filed Weights   06/22/21 0327 06/23/21 0415 06/24/21 0443  Weight: 103.9 kg 104.4 kg 108 kg    REVIEW OF SYSTEMS  PATIENT IS UNABLE TO PROVIDE COMPLETE REVIEW OF SYSTEMS DUE TO SEVERE CRITICAL ILLNESS AND TOXIC METABOLIC ENCEPHALOPATHY   PHYSICAL EXAMINATION: General: Adult female, lying in bed intubated & sedated requiring mechanical ventilation, no ventilator asynchrony HEENT: MM pink/moist, anicteric, atraumatic, neck supple Neuro: sedated, does not respond to pain on my exam. Will occasionally open eyes, but not to command.  CV: s1s2 RRR, NSR on monitor, no r/m/g Pulm: Regular, non labored on PRVC @ 28% & PEEP 5, breath sounds clear bilaterally, no rhonchi/wheezes/rales.  GI: soft, obese, bs x 4 GU: foley in place with clear yellow urine Skin: multiple scratches on lower right leg, healing Extremities: warm/dry, pulses + 2 R/P, no edema noted     Labs/imaging that I havepersonally reviewed  (right click and "Reselect all SmartList Selections" daily)  CXR 06/20/21: mild central vascular congestion CT head wo  contrast 06/20/21: no acute intracranial abnormality CT abd/pelvis 06/20/21: No evidence of inflammatory process or abscess. Evidence of cholelithiasis, bilateral nephrolithiasis, 5.4 cm benign-appearing left adnexal mass.   CSF 06/20/21: mild pleocytosis MR brain w/out 06/21/21: Scattered punctate acute infarcts in both cerebral hemispheres (bilateral PCA and left MCA territories). No associated hemorrhage or mass effect. Although the posterior fossa and anterior cerebral artery territories are spared, the pattern is suspicious for a recent embolic event. Hypotensive episode might also produce this appearance. 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  Echo 06/21/21: EF 60-65%, otherwise normal.  EEG 06/21/21: moderate diffuse slowing, focal right sided slowing, no epileptiform abnormalities.   Resolved Hospital Problem list    ASSESSMENT AND PLAN  60 y/o F with AMS, fever, and bizarre behavior. Intubated and sedated for airway protection in setting of acute encephalopathy d/t meningitis and Strokes with psychiatric mania and illness with  Opioid withdrawal.    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 Hypotensive). Extent of neurologic deficit unable to be assessed until patient off sedation and  extubated (if able). Appreciate neuro input. MEDICATION--  - Serotonin syndrome vs. NMS: presented with fever. On home venlafaxine; cyproheptadine started as antidote. - Opioid withdrawal: on chronic oxycodone (30m/day) for orthopedic pain. Could be overuse and subsequent withdrawal.  PSYCH-- - Acute mania vs.  emotional adjustment d/o: per psych- unable to assess mentation until patient off sedation/vent. No significant psych history seen in notes besides anxiety/depression. Per husband, has never had episode like this prior.    Sepsis without septic shock due to suspected meningitis versus viral illness (resolved)  - Lactic: 4.5-->3.9>> 1.4, Baseline PCT: <0.10, UA: +ketones +protein, CXR: mild central vascular congestion, CTH: negative, LP: mild pleocytosis  - Initial interventions/workup included: 1.6 L of LR & 1 L NS & Cefepime/ Vancomycin/ acyclovir - Supplemental oxygen as needed, to maintain SpO2 > 90% - f/u cultures, trend lactic/ PCT, f/u CK level - Daily CBC, monitor WBC/ fever curve - IV antibiotics for meningitis coverage: ceftriaxone, vancomycin, ampicillin & acyclovir  - D/C acyclovir d/t negative HSV PCR; appreciate input from ID for status of antibiotics    Fever - Resolved - ? Viral illness - ? Serotonin syndrome (started on cyproheptadine)-will stop  Severe ACUTE Hypoxic and Hypercapnic Respiratory Failure for airway protection secondary to sedation requirements in the setting of severe agitation & encephalopathy, +BRAIN DAMAGE  -continue Mechanical Ventilator support -continue Bronchodilator Therapy -Wean Fio2 and PEEP as tolerated -VAP/VENT bundle implementation -will perform SAT/SBT when respiratory parameters are met  Acute Encephalopathy secondary to suspected meningitis +BRAIN DAMAGE ON MRI bilateral scattered strokes R/O Serotonin syndrome, manic episode PMHx: Anxiety, chronic pain (w/ opioid dependence)  - supportive care - Appreciate input from psychiatry (cannot rule out manic episode) - Per ID- CSF shows mild pleocytosis (lymphatic), unlikely bacterial (continue abx & acyclovir for now), discontinue antibiotics if CSF studies negative. - neurology consult- appreciate input for MRI and CSF findings -Start Depakene for mood stabilization, cyproheptadine for  potential serotonin excess issues   ENDO - ICU hypoglycemic\Hyperglycemia protocol -check FSBS per protocol  ELECTROLYTES -follow labs as needed -replace as needed -pharmacy consultation and following   Best practice (right click and "Reselect all SmartList Selections" daily)  Diet: Begin TF Pain/Anxiety/Delirium protocol (if indicated): Yes (RASS goal -1) VAP protocol (if indicated): Yes DVT prophylaxis: SCD GI prophylaxis: H2B Glucose control:  SSI Yes Central venous access:  Yes, and it is still needed Arterial line:  N/A Foley:  Yes, and it is still needed Mobility:  bed rest  Code Status:  FULL Disposition:ICU  Labs   CBC: Recent Labs  Lab 06/19/21 1722 06/20/21 0500 06/21/21 1647 06/22/21 0448  WBC 14.4* 13.9* 15.0* 12.3*  NEUTROABS 12.9*  --   --   --   HGB 12.2 10.4* 9.9* 9.4*  HCT 39.4 34.0* 32.9* 31.0*  MCV 80.4 81.7 81.2 80.7  PLT 507* 419* 423* 3993   Basic Metabolic Panel: Recent Labs  Lab 06/20/21 0135 06/20/21 0500 06/20/21 0733 06/21/21 0524 06/22/21 0448 06/23/21 0509 06/24/21 0447  NA  --   --  136 140 141 142 145  K  --   --  4.2 3.2* 3.1* 3.5 3.5  CL  --   --  102 104 104 106 108  CO2  --   --  _0 GLUCOSE  --   --  269* 152* 182* 145* 172*  BUN  --   --  7 8 <5* 8 9  CREATININE  --   --  0.69 0.56 0.43* 0.45 0.45  CALCIUM  --   --  8.5* 8.7* 8.3* 8.1* 8.2*  MG 2.0 1.7  --  2.0 2.0  --   --   PHOS 1.2* 3.3  --  3.0 2.5 3.4  --    GFR: Estimated Creatinine Clearance: 87 mL/min (by C-G formula based on SCr of 0.45 mg/dL). Recent Labs  Lab 06/19/21 1722 06/19/21 2257 06/20/21 0052 06/20/21 0500 06/20/21 0733 06/20/21 1831 06/21/21 1647 06/22/21 0448  PROCALCITON  --  <0.10  --   --   --   --   --   --   WBC 14.4*  --   --  13.9*  --   --  15.0* 12.3*  LATICACIDVEN  --  4.5* 3.4*  --  3.9* 1.4  --   --     Liver Function Tests: Recent Labs  Lab 06/19/21 2257 06/21/21 0524 06/23/21 0509  AST 24 18  --    ALT 13 13  --   ALKPHOS 90 73  --   BILITOT 0.7 0.4  --   PROT 7.3 6.3*  --   ALBUMIN 3.6 2.9* 2.5*   No results for input(s): LIPASE, AMYLASE in the last 168 hours. Recent Labs  Lab 06/20/21 0135  AMMONIA 35    ABG    Component Value Date/Time   PHART 7.46 (H) 06/20/2021 0204   PCO2ART 32 06/20/2021 0204   PO2ART 177 (H) 06/20/2021 0204   HCO3 22.8 06/20/2021 0204   ACIDBASEDEF 0.5 06/20/2021 0204   O2SAT 99.6 06/20/2021 0204      Coagulation Profile: Recent Labs  Lab 06/20/21 0135  INR 1.1    Cardiac Enzymes: Recent Labs  Lab 06/20/21 0135 06/20/21 0500 06/21/21 0524  CKTOTAL 393* 473* 210    HbA1C: Hgb A1c MFr Bld  Date/Time Value Ref Range Status  06/20/2021 05:00 AM 8.2 (H) 4.8 - 5.6 % Final    Comment:    (NOTE) Pre diabetes:          5.7%-6.4%  Diabetes:              >6.4%  Glycemic control for   <7.0% adults with diabetes   03/06/2020 07:05 AM 9.1 (H) 4.8 - 5.6 % Final    Comment:    (NOTE)         Prediabetes: 5.7 - 6.4         Diabetes: >6.4         Glycemic control for adults with diabetes: <7.0     CBG: Recent Labs  Lab 06/23/21 1543 06/23/21 1928 06/23/21 2346 06/24/21 0332 06/24/21 0744  GLUCAP 135* 128* 222* 151* 194*     Continuous Infusions:  sodium chloride 10 mL/hr at 06/24/21 0600   acyclovir 720 mg (06/24/21 0629)   ampicillin (OMNIPEN) IV 2 g (06/24/21 0749)   cefTRIAXone (ROCEPHIN)  IV Stopped (06/23/21 2240)   dexmedetomidine (PRECEDEX) IV infusion Stopped (06/22/21 0155)   famotidine (PEPCID) IV Stopped (06/23/21 2201)   fentaNYL infusion INTRAVENOUS 200 mcg/hr (06/24/21 0600)   norepinephrine (LEVOPHED) Adult infusion Stopped (06/23/21 0747)   propofol (DIPRIVAN) infusion 50 mcg/kg/min (06/24/21 0807)   vancomycin Stopped (06/24/21 0125)   PRN Meds:.sodium chloride, albuterol, diclofenac Sodium, fentaNYL, hydrALAZINE, ibuprofen, midazolam, polyethylene glycol    DVT/GI PRX  assessed I Assessed the  need for Labs I Assessed the need for Foley I Assessed the need for Central Venous Line Family Discussion when available I Assessed the need for  Mobilization I made an Assessment of medications to be adjusted accordingly Safety Risk assessment completed  CASE DISCUSSED IN MULTIDISCIPLINARY ROUNDS WITH ICU TEAM     Critical Care Time devoted to patient care services described in this note is 50 minutes.  Critical care was necessary to treat /prevent imminent and life-threatening deterioration. Overall, patient is critically ill, prognosis is guarded.    Corrin Parker, M.D.  [facility]  Medical Director [place] Director [facility] Cardio-Pulmonary Department

## 2021-06-24 NOTE — Consult Note (Signed)
NEUROLOGY CONSULTATION NOTE   Date of service: June 24, 2021 Patient Name: Lauren Velazquez MRN:  [id number] DOB:  [birth year] Reason for consult: acute ischemic strokes Requesting physician: Dr. Flora Lipps _ _ _   _ __   _ __ _ _  __ __   _ __   __ _  History of Present Illness   This is a 60 yo woman with pmhx significant for DM2, HTN, vertigo, AMS admitted with acute encephalopathy requiring intubation for airway protection on whom neurology is consulted for workup of multifocal acute ischemic strokes. She had bizarre behavior in the ED and was screaming and sticking her finger down her throat trying to make herself vomit. She was initially afebrile but later Tmax was 104. In ED WBC 15.4, plts 507. Utox negative although patient is prescribed opiates o/p. CTH in ED NAICP. She became increasingly agitated but required LP to r/o meningitis so she was intubated and sedated so procedure could be obtained and since then patient has been unable to wean from vent 2/2 persistent encephalopathy rendering her unable to protect her airway. LP showed WBC 22 w/ 9% neutrophils, lymph 23%, monocytes 63%, RBC 317, protein 45. ID consulted and she was covered with meningitis with vanc, ceftriaxone, ampicillin, and acyclovir. Acyclovir was discontinued after HSV PCR returned negative. MRI brain wwo contrast showed scattered punctate acute infarcts in bilateral cerebral hemispheres with no large areas of infarction, no mass effect, and no hemorrhage. TTE 11/7 showed normal LVEF, grade I diastolic dysfunction, no sig valvular abnormalities and no intracardiac clot. Vascular imaging has not been performed. rEEG 11/7 showed moderate diffuse slowing with subtle superimposed right focal slowing without epileptiform abnormalities.  A1c 8.2   ROS   UTA 2/2 patient intubated and sedated  Past History   I have reviewed the following:  Past Medical History:  Diagnosis Date   Anemia    not currently under treatment    Anxiety    Asthma    during allergy season   Diabetes mellitus without complication ([place])    Hypertension    Vertigo    Past Surgical History:  Procedure Laterality Date   CARPAL TUNNEL RELEASE Right 2015   KNEE ARTHROSCOPY Left 05/17/2018   Procedure: ARTHROSCOPY KNEE WITH LATERAL RELEASE, PARTIAL SYNOVECTOMY;  Surgeon: Hessie Knows, MD;  Location: [facility];  Service: Orthopedics;  Laterality: Left;   KNEE ARTHROSCOPY WITH LATERAL RELEASE Right 12/28/2017   Procedure: KNEE ARTHROSCOPY WITH LATERAL RELEASE;  Surgeon: Hessie Knows, MD;  Location: [facility];  Service: Orthopedics;  Laterality: Right;   TIBIAL TUBERCLERPLASTY Bilateral 06/01/2017   Procedure: TIBIAL TUBERCLE SPUR EXCISION;  Surgeon: Hessie Knows, MD;  Location: [facility];  Service: Orthopedics;  Laterality: Bilateral;   History reviewed. No pertinent family history. Social History   Socioeconomic History   Marital status: Married    Spouse name: Not on file   Number of children: Not on file   Years of education: Not on file   Highest education level: Not on file  Occupational History   Not on file  Tobacco Use   Smoking status: Never   Smokeless tobacco: Never  Vaping Use   Vaping Use: Never used  Substance and Sexual Activity   Alcohol use: No   Drug use: No   Sexual activity: Not on file  Other Topics Concern   Not on file  Social History Narrative   Not on file   Social Determinants of Health   Financial Resource Strain: Not  on file  Food Insecurity: Not on file  Transportation Needs: Not on file  Physical Activity: Not on file  Stress: Not on file  Social Connections: Not on file   Allergies  Allergen Reactions   Atorvastatin Swelling    Medications   Medications Prior to Admission  Medication Sig Dispense Refill Last Dose   diclofenac sodium (VOLTAREN) 1 % GEL Apply 2 g topically 2 (two) times daily as needed (pain).    prn at prn   ferrous gluconate (FERGON) 324 MG tablet Take 324 mg  by mouth 3 (three) times daily.   unk   Fluticasone-Umeclidin-Vilant 100-62.5-25 MCG/ACT AEPB Inhale 1 puff into the lungs daily.   unk   gabapentin (NEURONTIN) 300 MG capsule Take 600 mg by mouth 2 (two) times daily.    unk   insulin aspart (NOVOLOG) 100 UNIT/ML injection Inject 0-20 Units into the skin 3 (three) times daily with meals. (based upon blood sugar reading)   unk   LANTUS SOLOSTAR 100 UNIT/ML Solostar Pen Inject 40 Units into the skin 2 (two) times daily. This is decreased from prior dose of 70 units twice daily due to low blood glucose. 15 mL 0 unk at unk   lisinopril (ZESTRIL) 40 MG tablet Take 40 mg by mouth daily.    unk   oxyCODONE-acetaminophen (PERCOCET) 10-325 MG tablet Take 1 tablet by mouth every 6 (six) hours as needed for pain.   0 prn at prn   traZODone (DESYREL) 100 MG tablet Take 200 mg by mouth at bedtime as needed for sleep.   prn at prn   venlafaxine XR (EFFEXOR-XR) 75 MG 24 hr capsule Take 75-150 mg by mouth 2 (two) times daily.   0 unk   VENTOLIN HFA 108 (90 Base) MCG/ACT inhaler Inhale 2 puffs into the lungs every 6 (six) hours as needed for wheezing or shortness of breath.   4 prn at prn      Current Facility-Administered Medications:    0.9 %  sodium chloride infusion, , Intravenous, PRN, Kasa, Kurian, MD, Last Rate: 10 mL/hr at 06/24/21 0600, Infusion Verify at 06/24/21 0600   albuterol (PROVENTIL) (2.5 MG/3ML) 0.083% nebulizer solution 2.5 mg, 2.5 mg, Nebulization, Q4H PRN, Rust-Chester, Britton L, NP   ampicillin (OMNIPEN) 2 g in sodium chloride 0.9 % 100 mL IVPB, 2 g, Intravenous, Q4H, Rust-Chester, Britton L, NP, Last Rate: 300 mL/hr at 06/24/21 1707, 2 g at 06/24/21 1707   arformoterol (BROVANA) nebulizer solution 15 mcg, 15 mcg, Nebulization, BID, Rust-Chester, Britton L, NP, 15 mcg at 06/24/21 0725   aspirin chewable tablet 81 mg, 81 mg, Per Tube, Daily, ,  M, MD, 81 mg at 06/24/21 1636   budesonide (PULMICORT) nebulizer solution 0.25 mg,  0.25 mg, Nebulization, BID, Rust-Chester, Britton L, NP, 0.25 mg at 06/24/21 0725   cefTRIAXone (ROCEPHIN) 2 g in sodium chloride 0.9 % 100 mL IVPB, 2 g, Intravenous, Q12H, Rust-Chester, Britton L, NP, Stopping Infusion hung by another clincian at 06/24/21 1753   chlorhexidine gluconate (MEDLINE KIT) (PERIDEX) 0.12 % solution 15 mL, 15 mL, Mouth Rinse, BID, Kasa, Kurian, MD, 15 mL at 06/24/21 0750   Chlorhexidine Gluconate Cloth 2 % PADS 6 each, 6 each, Topical, Q0600, Kasa, Kurian, MD, 6 each at 06/21/21 1000   clopidogrel (PLAVIX) tablet 75 mg, 75 mg, Per Tube, Daily, ,  M, MD, 75 mg at 06/24/21 1636   dexmedetomidine (PRECEDEX) 400 MCG/100ML (4 mcg/mL) infusion, 0.4-1.2 mcg/kg/hr, Intravenous, Titrated, Gonzalez, Carmen L, MD, Stopped   at 06/22/21 0155   diclofenac Sodium (VOLTAREN) 1 % topical gel 2 g, 2 g, Topical, BID PRN, Rust-Chester, Britton L, NP   docusate (COLACE) 50 MG/5ML liquid 100 mg, 100 mg, Per Tube, BID, Rust-Chester, Britton L, NP, 100 mg at 06/24/21 0939   enoxaparin (LOVENOX) injection 52.5 mg, 0.5 mg/kg, Subcutaneous, Q24H, Rust-Chester, Britton L, NP, 52.5 mg at 06/24/21 0749   famotidine (PEPCID) IVPB 20 mg premix, 20 mg, Intravenous, Q12H, Rust-Chester, Britton L, NP, Stopping Infusion hung by another clincian at 06/24/21 1753   feeding supplement (PROSource TF) liquid 45 mL, 45 mL, Per Tube, TID, Kasa, Kurian, MD, 45 mL at 06/24/21 1632   feeding supplement (VITAL HIGH PROTEIN) liquid 1,000 mL, 1,000 mL, Per Tube, Q24H, Kasa, Kurian, MD, 1,000 mL at 06/24/21 1530   fentaNYL (SUBLIMAZE) bolus via infusion 50-100 mcg, 50-100 mcg, Intravenous, Q2H PRN, Rust-Chester, Britton L, NP, 100 mcg at 06/21/21 2300   fentaNYL 2500mcg in NS 250mL (10mcg/ml) infusion-PREMIX, 0-200 mcg/hr, Intravenous, Continuous, Rust-Chester, Britton L, NP, Last Rate: 20 mL/hr at 06/24/21 1131, 200 mcg/hr at 06/24/21 1131   free water 150 mL, 150 mL, Per Tube, Q4H, Kasa, Kurian, MD, 150 mL at  06/24/21 1759   gabapentin (NEURONTIN) capsule 600 mg, 600 mg, Per Tube, BID, Rust-Chester, Britton L, NP, 600 mg at 06/24/21 0939   hydrALAZINE (APRESOLINE) injection 10-20 mg, 10-20 mg, Intravenous, Q4H PRN, Rust-Chester, Britton L, NP, 10 mg at 06/23/21 2151   ibuprofen (ADVIL) tablet 800 mg, 800 mg, Oral, Q6H PRN, Rust-Chester, Britton L, NP   insulin aspart (novoLOG) injection 0-20 Units, 0-20 Units, Subcutaneous, Q4H, Rust-Chester, Britton L, NP, 4 Units at 06/24/21 1636   insulin glargine-yfgn (SEMGLEE) injection 40 Units, 40 Units, Subcutaneous, BID, Cox, Amy N, DO, 40 Units at 06/24/21 0940   MEDLINE mouth rinse, 15 mL, Mouth Rinse, 10 times per day, Kasa, Kurian, MD, 15 mL at 06/24/21 1742   midazolam (VERSED) injection 2 mg, 2 mg, Intravenous, Q1H PRN, Rust-Chester, Britton L, NP, 2 mg at 06/23/21 0226   multivitamin liquid 15 mL, 15 mL, Per Tube, Daily, Gonzalez, Carmen L, MD, 15 mL at 06/24/21 0939   norepinephrine (LEVOPHED) 4mg in 250mL premix infusion, 0-40 mcg/min, Intravenous, Titrated, Rust-Chester, Britton L, NP, Stopped at 06/23/21 0747   polyethylene glycol (MIRALAX / GLYCOLAX) packet 17 g, 17 g, Oral, Daily PRN, Rust-Chester, Britton L, NP   polyethylene glycol (MIRALAX / GLYCOLAX) packet 17 g, 17 g, Per Tube, Daily, Rust-Chester, Britton L, NP, 17 g at 06/24/21 0939   propofol (DIPRIVAN) 1000 MG/100ML infusion, 5-80 mcg/kg/min, Intravenous, Continuous, Ward, Kristen N, DO, Last Rate: 31.4 mL/hr at 06/24/21 1808, 50 mcg/kg/min at 06/24/21 1808   valproic acid (DEPAKENE) 250 MG/5ML solution 500 mg, 500 mg, Per Tube, BID, Gonzalez, Carmen L, MD, 500 mg at 06/24/21 0940  Vitals   Vitals:   06/24/21 1600 06/24/21 1700 06/24/21 1800 06/24/21 1900  BP: 121/60 (!) 110/55 (!) 119/59 128/62  Pulse: 69 69 65 69  Resp: 18 18 18 13  Temp: 99 F (37.2 C) 98.2 F (36.8 C) 97.9 F (36.6 C) 97.7 F (36.5 C)  TempSrc:      SpO2: 97% 97% 97% 97%  Weight:      Height:          Body mass index is 44.99 kg/m.  Physical Exam   Physical Exam Gen: intubated, sedated Resp: ventilated CV: RRR  Patient not examined off sedation today 2/2 vent asynchrony and agitation with   attempted self-extubation during sedation pause earlier today  Neuro: *MS: does not follow commands on sedation *Speech: intubated *CN: pupils 2mm equal, sluggishly reactive bilat, (-) corneals, oculocephalics, cough, gag *Motor & sensory: no response to noxious stimuli in any extremity *Reflexes: 1+ and symmetric throughout, toes mute bilat   NIHSS not valid 2/2 examination under sedation    Labs   CBC:  Recent Labs  Lab 06/19/21 1722 06/20/21 0500 06/22/21 0448 06/24/21 1122  WBC 14.4*   < > 12.3* 8.1  NEUTROABS 12.9*  --   --   --   HGB 12.2   < > 9.4* 8.6*  HCT 39.4   < > 31.0* 29.1*  MCV 80.4   < > 80.7 84.1  PLT 507*   < > 383 328   < > = values in this interval not displayed.    Basic Metabolic Panel:  Lab Results  Component Value Date   NA 145 06/24/2021   K 3.5 06/24/2021   CO2 31 06/24/2021   GLUCOSE 172 (H) 06/24/2021   BUN 9 06/24/2021   CREATININE 0.45 06/24/2021   CALCIUM 8.2 (L) 06/24/2021   GFRNONAA >60 06/24/2021   GFRAA >60 03/10/2020   Lipid Panel: No results found for: LDLCALC HgbA1c:  Lab Results  Component Value Date   HGBA1C 8.2 (H) 06/20/2021   Urine Drug Screen:     Component Value Date/Time   LABOPIA NONE DETECTED 06/19/2021 2257   COCAINSCRNUR NONE DETECTED 06/19/2021 2257   LABBENZ NONE DETECTED 06/19/2021 2257   AMPHETMU NONE DETECTED 06/19/2021 2257   THCU NONE DETECTED 06/19/2021 2257   LABBARB NONE DETECTED 06/19/2021 2257    Alcohol Level No results found for: ETH   Impression   This is a 60 yo woman with pmhx significant for DM2, HTN, vertigo, AMS admitted with acute encephalopathy requiring intubation for airway protection on whom neurology is consulted for workup of multifocal acute ischemic strokes. Appearance of  strokes by MRI appears highly consistent with central embolic source.  Recommendations   - Goal normotension, avoid hypotension if at all possible to avoid extension of current areas of infarct - Start ASA 81mg daily + plavix 75mg daily x21 days f/b ASA 81mg daily after that - MRA H&N - Consult cardiology for consideration of TEE; stroke appearance highly favors central embolic source - Check LDL + add statin per guidelines - Abx CNS coverage per ID - q4 hr neuro checks - STAT head CT for any change in neuro exam - Tele - PT/OT/SLP when patient able to participate  - Will continue to follow   ______________________________________________________________________   Thank you for the opportunity to take part in the care of this patient. If you have any further questions, please contact the neurology consultation attending.  Signed,   , MD [facility] [phone]  If 7pm- 7am, please page neurology on call as listed in AMION.  

## 2021-06-24 NOTE — Consult Note (Signed)
  Psychiatry: Follow-up with this 60 year old woman without very clear past psychiatric history who is currently in the ICU on a ventilator.  See previous note.  Patient had presented originally to the emergency room very agitated with agitated confused and aggressive mental status.  Notes makes several mentions of demanding pain medicine.  Patient has been in the ICU on a ventilator for several days.  Efforts to extubate her have been complicated by agitation when waking up.  Work-up so far nonspecific as far as any cause of mental status change.  We will continue to follow up when more information is available.

## 2021-06-24 NOTE — Progress Notes (Signed)
ID Lauren Velazquez is a 60 y.o. female with a history of hypertension, DM, vertigo, HLD, anxiety, chronic pain on opioids,presents with altered mentation on 06/19/2021. The ED triage note says that she was screaming and yelling in the stretcher when she arrived and was sticking her finger down her throat in an attempt to make her self vomit.  She was yelling that she was in pain and needs to get off the stretcher.  She was complaining that she needed to take her pain medication and she was at pain clinic patient. In the ED physician assessed her her main complaint was chest pain and she was worried that she had a heart attack.  She also had bizarre behavior.  In the ED initially her temperature was 98, BP 191/102, heart rate of 104, sats of 96%.  Temperature later was as high as 104. Labs revealed WBC of 15.4, Hb 12.2, platelet 507.  Creatinine was 0.76.  Urine tox screen was negative. She underwent CT head and it was normal. Blood culture was sent Due to her increasingly agitated state in spite of benzodiazepine and the need for lumbar puncture it was decided to intubate her so as to protect airway as she needed further sedation.  She received 50 mg of Benadryl, 5 mg Haldol and 2 mg of Ativan x2.  10 mg of Decadron also was given. She underwent a lumbar puncture on 06/20/2021 at 2:37 AM.  It revealed a WBC of 22 with 9% neutrophils, lymphocytes of 23 and monocytes of 63.  Total protein was 45.  Glucose was 204.  RBC was 317. She has been covered for meningitis with vancomycin, ampicillin, ceftriaxone and acyclovir.  She is admitted to ICU Afebrile since 06/20/21 As per her nurse  gets agitated when sedation is stopped- unable to give SBT  O/e Intubated No change in clinical status On propafol, preceex B/l air entry Foley catheter 06/21/21 Triple lumen rt IJ 06/20/21 Edema of extremities   Labs CBC Latest Ref Rng & Units 06/24/2021 06/22/2021 06/21/2021  WBC 4.0 - 10.5 K/uL 8.1 12.3(H) 15.0(H)   Hemoglobin 12.0 - 15.0 g/dL 0.1(U) 9.3(A) 3.5(T)  Hematocrit 36.0 - 46.0 % 29.1(L) 31.0(L) 32.9(L)  Platelets 150 - 400 K/uL 328 383 423(H)     CMP Latest Ref Rng & Units 06/24/2021 06/23/2021 06/22/2021  Glucose 70 - 99 mg/dL 732(K) 025(K) 270(W)  BUN 6 - 20 mg/dL 9 8 <2(B)  Creatinine 7.62 - 1.00 mg/dL 8.31 5.17 6.16(W)  Sodium 135 - 145 mmol/L 145 142 141  Potassium 3.5 - 5.1 mmol/L 3.5 3.5 3.1(L)  Chloride 98 - 111 mmol/L 108 106 104  CO2 22 - 32 mmol/L 31 30 30   Calcium 8.9 - 10.3 mg/dL 8.2(L) 8.1(L) 8.3(L)  Total Protein 6.5 - 8.1 g/dL - - -  Total Bilirubin 0.3 - 1.2 mg/dL - - -  Alkaline Phos 38 - 126 U/L - - -  AST 15 - 41 U/L - - -  ALT 0 - 44 U/L - - -    Micro BC 06/19/21- NG 06/20/21 NG CSF 06/20/21 NG so far CSF cell count  22 ( 9% N) Protein 45 Glucose 204   MRI 1. Scattered punctate acute infarcts in both cerebral hemispheres (bilateral PCA and left MCA territories). No associated hemorrhage or mass effect. Although the posterior fossa and anterior cerebral artery territories are spared, the pattern is suspicious for a recent Embolic event. Hypotensive episode might also produce this appearance.   2. Small chronic  lacunar infarct in the left caudate nucleus, and mild for age periatrial white matter signal changes.    Impression/recommendation pt presenting with altered mental status and bizarre behavior- but when presenting to the ED she was awake but agitated, flipping recliner, screaming and shouting and asking for pain medications. Had to be intubated for airway protection for LP Fever with AMS- CSF mild pleocytosis- more lymphocytic With no history available from her and not much of a clear picture after talking to her daughters who do not see her that often the picture is still not clear D.D bacterial meningits less likely On acyclovir and triple antibiotics  to cover bacteria ( including listeria ) until csf culture and PCR are resulted. As HSV neg   , resulted today  DC acyclovir, Will Dc vanco as well today if csf culture neg and PCR CSF for meningitis /encephalitis panel then can discontinue ampicillin-     Other condition- Drug induced - like NMS /serotonin syndrome  in the D>D     MRI is abnormal suggestive of scattered punctate acute infarcts b/l- awaiting MRA 2 d echo N   Anemia   DM- Hba1c 8.9  Discussed the management with care team

## 2021-06-24 NOTE — Plan of Care (Signed)
  Problem: Nutrition: Goal: Adequate nutrition will be maintained Outcome: Progressing   

## 2021-06-24 NOTE — Progress Notes (Signed)
Nutrition Brief Follow Up Note   Propofol change   INTERVENTION:   Change to Vital HP @30ml /hr + ProSource TF 78ml TID via tube   Propofol @31 .32ml/hr- Provides 829kcal/day   Free water flushes q4 hours  Regimen provides 1669kcal/day, 96g/day protein and 15100ml/day of free water  Liquid MVI daily via tube   Estimated Nutritional Needs:   Kcal:  1136-1446kcal/day  Protein:  >95g/day  Fluid:  1.4-1.7L/day  MS, RD, LDN Please refer to [facility] for RD and/or RD on-call/weekend/after hours pager

## 2021-06-24 NOTE — Consult Note (Signed)
PHARMACY CONSULT NOTE  Pharmacy Consult for Electrolyte Monitoring and Replacement   Recent Labs: Potassium (mmol/L)  Date Value  06/24/2021 3.5  11/19/2014 3.8   Magnesium (mg/dL)  Date Value  85/27/7824 2.0   Calcium (mg/dL)  Date Value  23/53/6144 8.2 (L)   Calcium, Total (mg/dL)  Date Value  31/54/0086 9.4   Albumin (g/dL)  Date Value  76/19/5093 2.5 (L)  11/19/2014 3.9   Phosphorus (mg/dL)  Date Value  26/71/2458 3.4   Sodium (mmol/L)  Date Value  06/24/2021 145  11/19/2014 139   Assessment: Patient is a 60 y/o F with medical history including diabetes, HTN, vertigo, HLD, anxiety, chronic back pain who presented to the ED 11/5 with altered mentation. Patient subsequently found to have elevated lactic acid and leukocytosis without obvious source. There is concern for meningitis and lumbar puncture was performed and patient started on broad spectrum antimicrobial coverage. Patient ultimately required intubation for airway protection due to worsening agitation. Patient is currently admitted to the ICU. Pharmacy consulted to assist with electrolyte monitoring and replacement as indicated.  Goal of Therapy:  Electrolytes within normal limits  Plan:  --No replacement indicated today --Follow-up electrolytes with morning labs  Pricilla Riffle, PharmD, BCPS Clinical Pharmacist 06/24/2021 12:09 PM

## 2021-06-25 ENCOUNTER — Encounter
Admission: EM | Disposition: A | Discharge status: Other Acute Inpatient Hospital | Payer: Self-pay | Source: Home / Self Care | Attending: Pulmonary Disease

## 2021-06-25 ENCOUNTER — Inpatient Hospital Stay (HOSPITAL_COMMUNITY)
Admit: 2021-06-25 | Discharge: 2021-06-25 | Disposition: A | Discharge status: Home / Self Care | Payer: BLUE CROSS/BLUE SHIELD | Attending: Nurse Practitioner | Admitting: Nurse Practitioner

## 2021-06-25 DIAGNOSIS — I6389 Other cerebral infarction: Secondary | ICD-10-CM | POA: Diagnosis not present

## 2021-06-25 DIAGNOSIS — R410 Disorientation, unspecified: Secondary | ICD-10-CM | POA: Diagnosis not present

## 2021-06-25 DIAGNOSIS — I340 Nonrheumatic mitral (valve) insufficiency: Secondary | ICD-10-CM | POA: Diagnosis not present

## 2021-06-25 DIAGNOSIS — J9601 Acute respiratory failure with hypoxia: Secondary | ICD-10-CM | POA: Diagnosis not present

## 2021-06-25 DIAGNOSIS — G9340 Encephalopathy, unspecified: Secondary | ICD-10-CM | POA: Diagnosis not present

## 2021-06-25 DIAGNOSIS — R4182 Altered mental status, unspecified: Secondary | ICD-10-CM | POA: Diagnosis not present

## 2021-06-25 LAB — BASIC METABOLIC PANEL
Anion gap: 4 — ABNORMAL LOW (ref 5–15)
BUN: 10 mg/dL (ref 6–20)
CO2: 31 mmol/L (ref 22–32)
Calcium: 8.2 mg/dL — ABNORMAL LOW (ref 8.9–10.3)
Chloride: 108 mmol/L (ref 98–111)
Creatinine, Ser: 0.35 mg/dL — ABNORMAL LOW (ref 0.44–1.00)
GFR, Estimated: >60 mL/min (ref >60)
Glucose, Bld: 143 mg/dL — ABNORMAL HIGH (ref 70–99)
Potassium: 3.4 mmol/L — ABNORMAL LOW (ref 3.5–5.1)
Sodium: 143 mmol/L (ref 135–145)

## 2021-06-25 LAB — GLUCOSE, CAPILLARY
Glucose-Capillary: 117 mg/dL — ABNORMAL HIGH (ref 70–99)
Glucose-Capillary: 134 mg/dL — ABNORMAL HIGH (ref 70–99)
Glucose-Capillary: 122 mg/dL — ABNORMAL HIGH (ref 70–99)
Glucose-Capillary: 140 mg/dL — ABNORMAL HIGH (ref 70–99)
Glucose-Capillary: 151 mg/dL — ABNORMAL HIGH (ref 70–99)

## 2021-06-25 LAB — LIPID PANEL
Cholesterol: 197 mg/dL (ref 0–200)
HDL: 26 mg/dL — ABNORMAL LOW (ref >40)
LDL Cholesterol: 133 mg/dL — ABNORMAL HIGH (ref 0–99)
Total CHOL/HDL Ratio: 7.6 RATIO
Triglycerides: 189 mg/dL — ABNORMAL HIGH (ref <150)
VLDL: 38 mg/dL (ref 0–40)

## 2021-06-25 LAB — CULTURE, BLOOD (ROUTINE X 2): Culture: NO GROWTH

## 2021-06-25 SURGERY — ECHOCARDIOGRAM, TRANSESOPHAGEAL
Anesthesia: Choice

## 2021-06-25 MED ORDER — AMLODIPINE BESYLATE 5 MG PO TABS
5.0000 mg | ORAL_TABLET | Freq: Every day | ORAL | Status: DC
Start: 2021-06-25 — End: 2021-06-28
  Administered 2021-06-25: 5 mg
  Filled 2021-06-25 (×2): qty 1

## 2021-06-25 MED ORDER — EZETIMIBE 10 MG PO TABS
10.0000 mg | ORAL_TABLET | Freq: Every day | ORAL | Status: DC
  Filled 2021-06-25 (×3): qty 1

## 2021-06-25 MED ORDER — SODIUM CHLORIDE 0.9 % IV SOLN: INTRAVENOUS | Status: DC

## 2021-06-25 MED ORDER — POTASSIUM CHLORIDE 20 MEQ PO PACK
40.0000 meq | PACK | Freq: Once | ORAL | Status: AC
  Administered 2021-06-25: 40 meq
  Filled 2021-06-25: qty 2

## 2021-06-25 NOTE — Progress Notes (Signed)
ID Lauren Velazquez is a 60 y.o. female with a history of hypertension, DM, vertigo, HLD, anxiety, chronic pain on opioids,presents with altered mentation on 06/19/2021. The ED triage note says that she was screaming and yelling in the stretcher when she arrived and was sticking her finger down her throat in an attempt to make her self vomit.  She was yelling that she was in pain and needs to get off the stretcher.  She was complaining that she needed to take her pain medication and she was at pain clinic patient. In the ED physician assessed her her main complaint was chest pain and she was worried that she had a heart attack.  She also had bizarre behavior.  In the ED initially her temperature was 98, BP 191/102, heart rate of 104, sats of 96%.  Temperature later was as high as 104. Labs revealed WBC of 15.4, Hb 12.2, platelet 507.  Creatinine was 0.76.  Urine tox screen was negative. She underwent CT head and it was normal. Blood culture was sent Due to her increasingly agitated state in spite of benzodiazepine and the need for lumbar puncture it was decided to intubate her so as to protect airway as she needed further sedation.  She received 50 mg of Benadryl, 5 mg Haldol and 2 mg of Ativan x2.  10 mg of Decadron also was given. She underwent a lumbar puncture on 06/20/2021 at 2:37 AM.  It revealed a WBC of 22 with 9% neutrophils, lymphocytes of 23 and monocytes of 63.  Total protein was 45.  Glucose was 204.  RBC was 317. She has been covered for meningitis with vancomycin, ampicillin, ceftriaxone and acyclovir.  She is admitted to ICU Afebrile since 06/20/21 Spoke to her husband today- he has no more information to share- says she was well even a day before  Underwent TEE today- no thrombus or vegetation  O/e Intubated No change in clinical status On propafol, preceex B/l air entry Foley catheter 06/21/21 Triple lumen rt IJ 06/20/21 Edema of extremities   Labs CBC Latest Ref Rng & Units  06/24/2021 06/22/2021 06/21/2021  WBC 4.0 - 10.5 K/uL 8.1 12.3(H) 15.0(H)  Hemoglobin 12.0 - 15.0 g/dL 1.0(F) 7.5(Z) 0.2(H)  Hematocrit 36.0 - 46.0 % 29.1(L) 31.0(L) 32.9(L)  Platelets 150 - 400 K/uL 328 383 423(H)     CMP Latest Ref Rng & Units 06/25/2021 06/24/2021 06/23/2021  Glucose 70 - 99 mg/dL 852(D) 782(U) 235(T)  BUN 6 - 20 mg/dL 10 9 8   Creatinine 0.44 - 1.00 mg/dL ) 6.14(E 3.15  Sodium 135 - 145 mmol/L 143 145 142  Potassium 3.5 - 5.1 mmol/L 3.4(L) 3.5 3.5  Chloride 98 - 111 mmol/L 108 108 106  CO2 22 - 32 mmol/L 31 31 30   Calcium 8.9 - 10.3 mg/dL 8.2(L) 8.2(L) 8.1(L)  Total Protein 6.5 - 8.1 g/dL - - -  Total Bilirubin 0.3 - 1.2 mg/dL - - -  Alkaline Phos 38 - 126 U/L - - -  AST 15 - 41 U/L - - -  ALT 0 - 44 U/L - - -    Micro BC 06/19/21- NG 06/20/21 NG CSF 06/20/21 NG so far CSF cell count  22 ( 9% N) Protein 45 Glucose 204   MRI 1. Scattered punctate acute infarcts in both cerebral hemispheres (bilateral PCA and left MCA territories). No associated hemorrhage or mass effect. Although the posterior fossa and anterior cerebral artery territories are spared, the pattern is suspicious for a recent Embolic  event. Hypotensive episode might also produce this appearance.   2. 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    Impression/recommendation pt presenting with altered mental status and bizarre behavior- but when presenting to the ED she was awake but agitated, flipping recliner, screaming and shouting and asking for pain medications. 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

## 2021-06-25 NOTE — Progress Notes (Addendum)
Nutrition Follow Up Note   DOCUMENTATION CODES:   Obesity unspecified  INTERVENTION:   Change to Vital 1.2 @55ml /hr   Free water flushes 30m q4 hours to maintain tube patency   Regimen provides 1584kcal/day, 99g/day protein and 12539mday of free water  NUTRITION DIAGNOSIS:   Inadequate oral intake related to inability to eat (pt sedated and ventilated) as evidenced by NPO status.  GOAL:   Provide needs based on ASPEN/SCCM guidelines -met with tube feeds   MONITOR:   Vent status, Labs, Weight trends, TF tolerance, Skin, I & O's  ASSESSMENT:   [age]/o female with h/o DM, HTN, HLD, anxiety and COVID 19 who is admitted with sepsis and suspected meningitis.  Pt remains sedated and ventilated. OGT in place. Pt tolerating tube feeds at goal rate. Propofol stopped today; will adjust tube feeds. Per chart, pt up ~7lbs since admit. Pt up ~ 8.3L on her I & Os. Pt undergoing SBTs today. Plan is for possible TEE.   Medications reviewed and include: aspirin, plavix, insulin, MVI, miralax, omnipen, ceftriaxone, precedex, pepcid  Labs reviewed: K 3.4(L), creat 0.35(L) Hgb 8.6(L), Hct 29.1(L) Cbgs- 117, 134 x 24 hrs  Patient is currently intubated on ventilator support MV: 8.0 L/min Temp (24hrs), Avg:98.5 F (36.9 C), Min:97.3 F (36.3 C), Max:100 F (37.8 C)  Propofol: none   MAP- >659m  UOP- [id number] Order:   Diet Order             Diet NPO time specified  Diet effective now                  EDUCATION NEEDS:   No education needs have been identified at this time  Skin:  Skin Assessment: Reviewed RN Assessment  Last BM:  Unknown  Height:   Ht Readings from Last 1 Encounters:  06/20/21 5' 1"  (1.549 m)    Weight:   Wt Readings from Last 1 Encounters:  06/25/21 108.3 kg    Ideal Body Weight:  47.7 kg  BMI:  Body mass index is 45.11 kg/m.  Estimated Nutritional Needs:   Kcal:  1136-1446kcal/day  Protein:  >95g/day  Fluid:   1.4-1.7L/day  CaseKoleen Distance RD, LDN Please refer to [facility] and/or RD on-call/weekend/after hours pager

## 2021-06-25 NOTE — Plan of Care (Signed)
Neurology plan of care  MRA H&N showed <50% carotid stenosis bilat with no other hemodynamically significant stenoses in the head & neck. Patient was started on ASA and plavix yesterday. TEE scheduled with cardiology likely for later today, neurology will f/u tmrw after results are available.  Bing Neighbors, MD [facility] [phone]  If 7pm- 7am, please page neurology on call as listed in AMION.

## 2021-06-25 NOTE — Progress Notes (Signed)
    [facility] has been requested to perform a transesophageal echocardiogram on Lauren Velazquez for stroke.  [name], who advised that I speak with the patient's husband.  I met with her husband, Lauren Velazquez, who is at the bedside today.   After careful review of history and examination, the risks and benefits of transesophageal echocardiogram have been explained including risks of esophageal damage, perforation (1:10,000 risk), bleeding, pharyngeal hematoma as well as other potential complications associated with conscious sedation including aspiration, arrhythmia, respiratory failure and death. Alternatives to treatment were discussed, questions were answered. Patient's husband wishes for [place] to proceed.   Murray Hodgkins, NP  06/25/2021 12:17 PM

## 2021-06-25 NOTE — Progress Notes (Signed)
NAME:  Lauren Velazquez, MRN:  [id number], DOB:  [birth year], LOS: 5 ADMISSION DATE:  06/19/2021 CONSULTATION DATE:  06/19/2021  REFERRING MD:  Dr. Leonides Schanz  CHIEF COMPLAINT:  Altered Mental Status    BRIEF SYNOPSIS: 60 y/o F with PMH  History of Present Illness:  60 year old female presenting to [facility] ED from home via EMS for multiple complaints including stroke, chest pain, shortness of breath and pain all over. (Per ED documentation)  ED course: Per ED documentation upon arrival patient was agitated and extremely altered, exhibiting bizarre behavior.  She was screaming and yelling sticking her finger down her throat and attempt to make her self vomit.  While sitting in a recliner began attempting to flip it over eventually requiring physical restraint for her own safety.  ED staff attempted to sedate her as she was kicking and swinging arms at staff and husband.  Her main complaint to the ED provider was chest pain, she stated she was worried that she might have had a heart attack as well as feeling very dehydrated.  She also insisted on being given her chronic pain medication, however she also told staff she will take all her pills and overdose. Patient was placed under IVC.  Per ED documentation, husband denied recreational drug use or any new medications (unclear if this comment was made to EDP or EMS).  While in the ED patient became febrile and sepsis protocol was initiated.  No definitive source of infection noted in CXR/UA, this in combination with severely altered mental status raise concern for meningitis.  Due to patient's increasingly agitated state refractory to benzodiazepines the decision was made by the EDP to mechanically intubate for airway protection in order to provide further sedation so that an LP could be performed safely  Medications given: 50 mg Benadryl, 5 mg Haldol, 2 mg Ativan x2, cefepime/vancomycin/acyclovir, 10 mg of Decadron, total 2.6 L IV fluid bolus, succinylcholine for RSI,  650 mg acetaminophen for fever Initial Vitals: Severely febrile at 103.9, tachypneic 32, tachycardic 134, hypertensive urgency 232/97 & 95% on room air EKG Interpretation: Date: 06/19/2021, EKG Time: 23:23, Rate: 121, Rhythm: Sinus tachycardia, QRS Axis: RAD, Intervals: Prolonged QTC, ST/T Wave abnormalities: None, Narrative Interpretation: Sinus tachycardia with RAD Chemistry: Na+: 134, K+: 3.9, BUN/Cr.:  9/0.76, Serum CO2/ AG: 28/9 Hematology: WBC: 14.4, Hgb: 12.2,  Normal troponin: 8> 15,  Lactic/ PCT: 4.5/pending, COVID-19 & Influenza A/B: negative ABG: 7.46/32/177/22.8 CXR 06/20/21: mild central vascular congestion CT head wo contrast 06/20/21: no acute intracranial abnormality   PCCM consulted for admission due to need for emergent intubation for airway protection and mechanical ventilatory support.  Pertinent  Medical History  T2DM HTN Vertigo HLD Anxiety Chronic Back pain COVID-19   Significant Hospital Events: Including procedures, antibiotic start and stop dates in addition to other pertinent events   06/20/21- Admit to ICU s/p mechanical intubation for airway protection in the setting of increased sedation needs due to continued agitation s/t suspected meningitis 06/21/21- Intubated and sedation, agitated when sedation was weaned today. MRI w/out contrast shows acute infarcts (embolic vs. Hypotensive). ID, neuro, and psych consulted.   06/22/2021-persistent agitation off of sedation.  Started Depakene for mood stabilization and cyproheptadine for potential serotonergic symptoms. 11/9 remains intubated 11/10: remains intubated and sedated. Goal to wean sedation today as able, on minimal vent settings.  11/11: remains intubated and sedated, able to follow commands with decreased sedation. Will trial SBT today. Possible TEE per cardiology today.  Micro Data:  11/6>> CSF- mild  pleocytosis. Culture- no growth at 4 days. VDRL- negative.  11/6>> blood cultures- negative (no growth  _0 ) 11/6>> MRSA PCR - negative  11/6>> HSV1/2- negative  11/6>> Crypotococcal Ag - negative   Antimicrobials:  11/6 Cefepime >> x 1 dose 11/6 Vancomycin >> 11/10  11/6 Ampicillin >> 11/6 Ceftriaxone >> 11/6 Acyclovir >> 11/10 (HSV PCR negative)   Interim History / Subjective:  Patient remains intubated and sedated today. Goal to wean sedation, change to precedex from fentanyl & propofol as able. Currently on minimal ventilatory support. She is able to follow most commands today as sedation has been weaned. Possible TEE today if possible per cardiology.   Objective   Blood pressure (!) 159/86, pulse 98, temperature 98.2 F (36.8 C), temperature source Oral, resp. rate 19, height _1  (1.549 m), weight 108.3 kg, last menstrual period 08/15/2008, SpO2 98 %.    Vent Mode: PRVC FiO2 (%):  [28 %] 28 % Set Rate:  [18 bmp] 18 bmp Vt Set:  [450 mL] 450 mL PEEP:  [5 cmH20] 5 cmH20 Plateau Pressure:  [24 cmH20] 24 cmH20   Intake/Output Summary (Last 24 hours) at 06/25/2021 0924 Last data filed at 06/25/2021 0800 Gross per 24 hour  Intake 2168.01 ml  Output 2125 ml  Net 43.01 ml   Filed Weights   06/23/21 0415 06/24/21 0443 06/25/21 0455  Weight: 104.4 kg 108 kg 108.3 kg    REVIEW OF SYSTEMS  PATIENT IS UNABLE TO PROVIDE COMPLETE REVIEW OF SYSTEMS DUE TO SEVERE CRITICAL ILLNESS AND TOXIC METABOLIC ENCEPHALOPATHY   PHYSICAL EXAMINATION: General: Adult female, lying in bed intubated & sedated requiring mechanical ventilation, no ventilator asynchrony HEENT: MM pink/moist, anicteric, atraumatic, neck supple Neuro: sedated, follows most simple commands. Opens eyes to name. Does not track with eyes. Generalized weakness, able to wiggle toes.  CV: s1s2 RRR, NSR on monitor, no r/m/g Pulm: Regular, non labored on PRVC @ 28% & PEEP 5, rhonchi bilaterally with transmitted upper airway sounds.  GI: soft, obese, bs x 4 GU: foley in place with yellow urine Skin: multiple scratches on  lower right leg, healing Extremities: warm/dry, pulses + 2 R/P, 1+ edema LE bilaterally noted   Labs/imaging that I havepersonally reviewed  (right click and "Reselect all SmartList Selections" daily)  CXR 06/20/21: mild central vascular congestion CT head wo contrast 06/20/21: no acute intracranial abnormality CT abd/pelvis 06/20/21: No evidence of inflammatory process or abscess. Evidence of cholelithiasis, bilateral nephrolithiasis, 5.4 cm benign-appearing left adnexal mass.   CSF 06/20/21: mild pleocytosis MR brain w/out 06/21/21: Scattered punctate acute infarcts in both cerebral hemispheres (bilateral PCA and left MCA territories). No associated hemorrhage or mass effect. Although the posterior fossa and anterior cerebral artery territories are spared, the pattern is suspicious for a recent embolic event. Hypotensive episode might also produce this appearance. 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  Echo 06/21/21: EF 60-65%, otherwise normal.  EEG 06/21/21: moderate diffuse slowing, focal right sided slowing, no epileptiform abnormalities.  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 Wide patency of the left carotid artery system within the neck. Widely patent vertebral arteries within the neck.  Resolved Hospital Problem list    ASSESSMENT AND PLAN  60 y/o F with AMS, fever, and bizarre behavior. Intubated and sedated for airway protection in setting of acute encephalopathy d/t possible viral meningitis with embolic strokes and  possible psychiatric mania vs opioid withdrawal.   Working DDX for AMS/encephalopathy/fever/bizarre behavior:  NEURO-- - Viral meningitis: HSV encephalitis - negative HSV1/2 PCR, could be oher virus (fever + AMS/bizarre behavior + lymphocytes in CSF) - Bacterial meningitis: CSF- mild pleocytosis (more  lymphocytic), unlikely bacterial per ID  - Stroke: mental status changes from acute infarct seen on MRI - likely embolic per neurology. Extent of neurologic deficit unable to be assessed until patient off sedation and extubated (if able).  MEDICATION--  - Serotonin syndrome vs. NMS: presented with fever. On home venlafaxine; cyproheptadine given as antidote. Fever resolved.  - Opioid withdrawal: on chronic oxycodone (50mg /day) for orthopedic pain. Could be overuse and subsequent withdrawal.  PSYCH-- - Acute mania vs. emotional adjustment d/o: per psych- unable to assess mentation until patient off sedation/vent. No significant psych history seen in notes besides anxiety/depression. Per husband, has never had episode like this prior.   RESPIRATORY- Intubation for airway protection secondary to sedation requirements in the setting of severe agitation & encephalopathy -continue Mechanical Ventilator support, on minimal supportive settings -continue Bronchodilator Therapy -Wean Fio2 and PEEP as tolerated -VAP/VENT bundle implementation -will perform SAT/SBT when respiratory parameters are met - Wean fentanyl & propofol as able, use precedex for agitation/sedation when needed  NEURO/PSYCH- Acute Encephalopathy secondary to suspected viral meningitis  MRI bilateral scattered strokes (likely embolic)  R/O Serotonin syndrome, manic episode PMHx: Anxiety, chronic pain (w/ opioid dependence)  - supportive care - Appreciate input from psychiatry (cannot rule out manic episode) - Per ID- CSF shows mild pleocytosis (lymphatic), unlikely bacterial. Dc'd acyclovir per negative HSV PCR. Dc'd vancomycin. Continue ampicillin & ceftriaxone for now per ID.   - Per neurology - strokes likely embolic- started on DAPT - ASA 81 & plavix 75mg  x 21 d, then just ASA. Lipid panel ordered for possible statin.  - Cardiology consulted for possible TEE to identify source of central emboli in setting of normal TTE.  Appreciate cardiology input.  -Continue Depakene for mood stabilization  SEPSIS without septic shock due to suspected viral meningitis versus other viral illness (resolved)  - Lactic: 4.5-->3.9>> 1.4, Baseline PCT: <0.10, UA: +ketones +protein, CXR: mild central vascular congestion, CTH: negative, LP: mild pleocytosis  - Initial interventions/workup included: 1.6 L of LR & 1 L NS & Cefepime/ Vancomycin/ acyclovir - Supplemental oxygen as needed, to maintain SpO2 > 90% - f/u cultures, trend lactic/ PCT, f/u CK level - Daily CBC, monitor WBC/ fever curve - IV antibiotics given for meningitis coverage: ceftriaxone, vancomycin, ampicillin & acyclovir  - D/C acyclovir d/t negative HSV PCR; vancomycin D/C'd today per ID-- appreciate continued input from ID for when to discontinue other antibiotics    Fever (resolved) - Viral illness vs. serotonin syndrome (given cyproheptadine)  HYPERTENSION  - started on amlodipine 5 mg (diastolic dysfunction)  - PRN hydralazine as needed to maintain BP w/in parameters    ENDO - ICU hypoglycemic\Hyperglycemia protocol -check FSBS per protocol  ELECTROLYTES -follow labs as needed -replace as needed -pharmacy consultation and following  Best practice (right click and "Reselect all SmartList Selections" daily)  Diet: Begin TF Pain/Anxiety/Delirium protocol (if indicated): Yes (RASS goal -1) VAP protocol (if indicated): Yes DVT prophylaxis: SCD GI prophylaxis: H2B Glucose control:  SSI Yes Central venous access:  Yes, and it is still needed Arterial line:  N/A Foley:  Yes, and it is still needed Mobility:  bed rest  Code Status:  FULL Disposition:ICU  Labs   CBC: Recent Labs  Lab 06/19/21 1722  06/20/21 0500 06/21/21 1647 06/22/21 0448 06/24/21 1122  WBC 14.4* 13.9* 15.0* 12.3* 8.1  NEUTROABS 12.9*  --   --   --   --   HGB 12.2 10.4* 9.9* 9.4* 8.6*  HCT 39.4 34.0* 32.9* 31.0* 29.1*  MCV 80.4 81.7 81.2 80.7 84.1  PLT 507* 419* 423* 383  528    Basic Metabolic Panel: Recent Labs  Lab 06/20/21 0135 06/20/21 0500 06/20/21 0733 06/21/21 0524 06/22/21 0448 06/23/21 0509 06/24/21 0447 06/25/21 0425  NA  --   --    < > 140 141 142 145 143  K  --   --    < > 3.2* 3.1* 3.5 3.5 3.4*  CL  --   --    < > 104 104 106 108 108  CO2  --   --    < > _0 GLUCOSE  --   --    < > 152* 182* 145* 172* 143*  BUN  --   --    < > 8 <5* _1 CREATININE  --   --    < > 0.56 0.43* 0.45 0.45 0.35*  CALCIUM  --   --    < > 8.7* 8.3* 8.1* 8.2* 8.2*  MG 2.0 1.7  --  2.0 2.0  --   --   --   PHOS 1.2* 3.3  --  3.0 2.5 3.4  --   --    < > = values in this interval not displayed.   GFR: Estimated Creatinine Clearance: 87.1 mL/min (A) (by C-G formula based on SCr of 0.35 mg/dL (L)). Recent Labs  Lab 06/19/21 2257 06/20/21 0052 06/20/21 0500 06/20/21 0733 06/20/21 1831 06/21/21 1647 06/22/21 0448 06/24/21 1122  PROCALCITON <0.10  --   --   --   --   --   --   --   WBC  --   --  13.9*  --   --  15.0* 12.3* 8.1  LATICACIDVEN 4.5* 3.4*  --  3.9* 1.4  --   --   --     Liver Function Tests: Recent Labs  Lab 06/19/21 2257 06/21/21 0524 06/23/21 0509  AST 24 18  --   ALT 13 13  --   ALKPHOS 90 73  --   BILITOT 0.7 0.4  --   PROT 7.3 6.3*  --   ALBUMIN 3.6 2.9* 2.5*   No results for input(s): LIPASE, AMYLASE in the last 168 hours. Recent Labs  Lab 06/20/21 0135  AMMONIA 35    ABG    Component Value Date/Time   PHART 7.46 (H) 06/20/2021 0204   PCO2ART 32 06/20/2021 0204   PO2ART 177 (H) 06/20/2021 0204   HCO3 22.8 06/20/2021 0204   ACIDBASEDEF 0.5 06/20/2021 0204   O2SAT 99.6 06/20/2021 0204      Coagulation Profile: Recent Labs  Lab 06/20/21 0135  INR 1.1    Cardiac Enzymes: Recent Labs  Lab 06/20/21 0135 06/20/21 0500 06/21/21 0524  CKTOTAL 393* 473* 210    HbA1C: Hgb A1c MFr Bld  Date/Time Value Ref Range Status  06/20/2021 05:00 AM 8.2 (H) 4.8 - 5.6 % Final    Comment:     (NOTE) Pre diabetes:          5.7%-6.4%  Diabetes:              >6.4%  Glycemic control for   <7.0% adults with diabetes  03/06/2020 07:05 AM 9.1 (H) 4.8 - 5.6 % Final    Comment:    (NOTE)         Prediabetes: 5.7 - 6.4         Diabetes: >6.4         Glycemic control for adults with diabetes: <7.0     CBG: Recent Labs  Lab 06/24/21 1538 06/24/21 1911 06/24/21 2346 06/25/21 0353 06/25/21 0742  GLUCAP 176* 163* 124* 117* 134*     Continuous Infusions:  sodium chloride 10 mL/hr at 06/25/21 0535   ampicillin (OMNIPEN) IV 2 g (06/25/21 0733)   cefTRIAXone (ROCEPHIN)  IV Stopped (06/24/21 2350)   dexmedetomidine (PRECEDEX) IV infusion Stopped (06/22/21 0155)   famotidine (PEPCID) IV 20 mg (06/25/21 0919)   fentaNYL infusion INTRAVENOUS 200 mcg/hr (06/25/21 0535)   norepinephrine (LEVOPHED) Adult infusion Stopped (06/23/21 0747)   propofol (DIPRIVAN) infusion Stopped (06/25/21 0829)   PRN Meds:.sodium chloride, albuterol, diclofenac Sodium, fentaNYL, hydrALAZINE, ibuprofen, midazolam, polyethylene glycol  Sam Juanita Craver PA-S [place] [facility]

## 2021-06-25 NOTE — Progress Notes (Signed)
Patient is alert & following commands. SBT initiated.

## 2021-06-25 NOTE — Consult Note (Signed)
PHARMACY CONSULT NOTE  Pharmacy Consult for Electrolyte Monitoring and Replacement   Recent Labs: Potassium (mmol/L)  Date Value  06/25/2021 3.4 (L)  11/19/2014 3.8   Magnesium (mg/dL)  Date Value  93/26/7124 2.0   Calcium (mg/dL)  Date Value  58/04/9832 8.2 (L)   Calcium, Total (mg/dL)  Date Value  82/50/5397 9.4   Albumin (g/dL)  Date Value  67/34/1937 2.5 (L)  11/19/2014 3.9   Phosphorus (mg/dL)  Date Value  90/24/0973 3.4   Sodium (mmol/L)  Date Value  06/25/2021 143  11/19/2014 139   Assessment: Patient is a 60 y/o F with medical history including diabetes, HTN, vertigo, HLD, anxiety, chronic back pain who presented to the ED 11/5 with altered mentation. Patient subsequently found to have elevated lactic acid and leukocytosis without obvious source. There is concern for meningitis and lumbar puncture was performed and patient started on broad spectrum antimicrobial coverage. Patient ultimately required intubation for airway protection due to worsening agitation. Patient is currently admitted to the ICU. Pharmacy consulted to assist with electrolyte monitoring and replacement as indicated.  Goal of Therapy:  Electrolytes within normal limits  Plan:  --Potassium 40 mEq per tube --Follow-up electrolytes with morning labs  Pricilla Riffle, PharmD, BCPS Clinical Pharmacist 06/25/2021 12:02 PM

## 2021-06-25 NOTE — Progress Notes (Signed)
Transesophageal Echocardiogram :  Indication: Multiple strokes Requesting/ordering  physician: Dr. Jayme Cloud, pulmonary  Procedure: Lubricant jelly applied to probe, using digital technique an omniplane probe was advanced into the distal esophagus without incident.  Patient on the ventilator with NG tube in place, left undisturbed  Moderate sedation: 1. Sedation used: Patient already intubated and sedated Sedation managed by ICU nurse  See report in EPIC  for complete details: In brief,  No left atrial or left atrial appendage thrombus transgastric imaging revealed normal LV function with no RWMAs and no mural apical thrombus.  .  Estimated ejection fraction was 55%.  Right sided cardiac chambers were normal with no evidence of pulmonary hypertension.  Imaging of the septum showed no ASD or VSD Bubble study was negative for shunt 2D and color flow confirmed no PFO  Mild to moderate mitral valve regurgitation, 2 jets  The LA was well visualized in orthogonal views.  There was no spontaneous contrast and no thrombus in the LA and LA appendage   The descending thoracic aorta had mild atherosclerotic plaque with no evidence of aneurysmal dilation or disection  Aortic arch with moderate calcified atheroma  Complications   Julien Nordmann 06/25/2021 2:26 PM

## 2021-06-25 NOTE — Progress Notes (Signed)
*  PRELIMINARY RESULTS* Echocardiogram Echocardiogram Transesophageal has been performed.  Cristela Blue 06/25/2021, 2:24 PM

## 2021-06-25 NOTE — Progress Notes (Signed)
Propofol stopped as ordered by Dr. Jayme Cloud to check patient's neuro status

## 2021-06-26 DIAGNOSIS — R410 Disorientation, unspecified: Secondary | ICD-10-CM | POA: Diagnosis not present

## 2021-06-26 LAB — BLOOD GAS, VENOUS
Acid-Base Excess: 15.7 mmol/L — ABNORMAL HIGH (ref 0.0–2.0)
Bicarbonate: 40.1 mmol/L — ABNORMAL HIGH (ref 20.0–28.0)
Delivery systems: POSITIVE
FIO2: 0.35
Mechanical Rate: 10
O2 Saturation: 76.9 %
Patient temperature: 37
RATE: 10 resp/min
pCO2, Ven: 48 mmHg (ref 44.0–60.0)
pH, Ven: 7.53 — ABNORMAL HIGH (ref 7.250–7.430)
pO2, Ven: 36 mmHg (ref 32.0–45.0)

## 2021-06-26 LAB — GLUCOSE, CAPILLARY
Glucose-Capillary: 118 mg/dL — ABNORMAL HIGH (ref 70–99)
Glucose-Capillary: 155 mg/dL — ABNORMAL HIGH (ref 70–99)
Glucose-Capillary: 180 mg/dL — ABNORMAL HIGH (ref 70–99)
Glucose-Capillary: 199 mg/dL — ABNORMAL HIGH (ref 70–99)
Glucose-Capillary: 219 mg/dL — ABNORMAL HIGH (ref 70–99)
Glucose-Capillary: 96 mg/dL (ref 70–99)
Glucose-Capillary: 97 mg/dL (ref 70–99)

## 2021-06-26 LAB — BASIC METABOLIC PANEL
Anion gap: 6 (ref 5–15)
BUN: 12 mg/dL (ref 6–20)
CO2: 30 mmol/L (ref 22–32)
Calcium: 8.6 mg/dL — ABNORMAL LOW (ref 8.9–10.3)
Chloride: 108 mmol/L (ref 98–111)
Creatinine, Ser: 0.43 mg/dL — ABNORMAL LOW (ref 0.44–1.00)
GFR, Estimated: >60 mL/min (ref >60)
Glucose, Bld: 193 mg/dL — ABNORMAL HIGH (ref 70–99)
Potassium: 4.3 mmol/L (ref 3.5–5.1)
Sodium: 144 mmol/L (ref 135–145)

## 2021-06-26 LAB — CBC
HCT: 29.7 % — ABNORMAL LOW (ref 36.0–46.0)
Hemoglobin: 8.8 g/dL — ABNORMAL LOW (ref 12.0–15.0)
MCH: 24.9 pg — ABNORMAL LOW (ref 26.0–34.0)
MCHC: 29.6 g/dL — ABNORMAL LOW (ref 30.0–36.0)
MCV: 84.1 fL (ref 80.0–100.0)
Platelets: 335 10*3/uL (ref 150–400)
RBC: 3.53 MIL/uL — ABNORMAL LOW (ref 3.87–5.11)
RDW: 16.3 % — ABNORMAL HIGH (ref 11.5–15.5)
WBC: 7.7 10*3/uL (ref 4.0–10.5)
nRBC: 0.3 % — ABNORMAL HIGH (ref 0.0–0.2)

## 2021-06-26 LAB — SEDIMENTATION RATE: Sed Rate: 75 mm/hr — ABNORMAL HIGH (ref 0–30)

## 2021-06-26 LAB — PHOSPHORUS: Phosphorus: 3 mg/dL (ref 2.5–4.6)

## 2021-06-26 LAB — MAGNESIUM: Magnesium: 2.4 mg/dL (ref 1.7–2.4)

## 2021-06-26 MED ORDER — SODIUM CHLORIDE 0.9 % IV SOLN
200.0000 mg | Freq: Once | INTRAVENOUS | Status: AC
  Administered 2021-06-26: 200 mg via INTRAVENOUS
  Filled 2021-06-26: qty 20

## 2021-06-26 MED ORDER — METOCLOPRAMIDE HCL 5 MG/ML IJ SOLN
5.0000 mg | Freq: Three times a day (TID) | INTRAMUSCULAR | Status: DC
Start: 2021-06-26 — End: 2021-06-26

## 2021-06-26 MED ORDER — LEVOTHYROXINE SODIUM 100 MCG/5ML IV SOLN: 25.0000 ug | Freq: Every day | INTRAVENOUS | Status: DC

## 2021-06-26 MED ORDER — SODIUM CHLORIDE 0.9 % IV SOLN: 100.0000 mg | Freq: Two times a day (BID) | INTRAVENOUS | Status: DC

## 2021-06-26 MED ORDER — FUROSEMIDE 10 MG/ML IJ SOLN
40.0000 mg | Freq: Once | INTRAMUSCULAR | Status: AC
  Administered 2021-06-26: 40 mg via INTRAVENOUS
  Filled 2021-06-26: qty 4

## 2021-06-26 MED ORDER — ENOXAPARIN SODIUM 60 MG/0.6ML IJ SOSY
0.5000 mg/kg | PREFILLED_SYRINGE | INTRAMUSCULAR | Status: DC
  Administered 2021-06-27 – 2021-06-30 (×4): 55 mg via SUBCUTANEOUS
  Filled 2021-06-26 (×5): qty 0.55

## 2021-06-26 MED ORDER — MORPHINE SULFATE (PF) 2 MG/ML IV SOLN
1.0000 mg | INTRAVENOUS | Status: DC | PRN
Start: 2021-06-26 — End: 2021-06-28
  Administered 2021-06-26 – 2021-06-27 (×2): 2 mg via INTRAVENOUS
  Administered 2021-06-27: 1 mg via INTRAVENOUS
  Administered 2021-06-28 (×2): 2 mg via INTRAVENOUS
  Filled 2021-06-26 (×6): qty 1

## 2021-06-26 MED ORDER — VALPROATE SODIUM 100 MG/ML IV SOLN
500.0000 mg | Freq: Two times a day (BID) | INTRAVENOUS | Status: DC
  Filled 2021-06-26 (×2): qty 5

## 2021-06-26 MED ORDER — SODIUM CHLORIDE 0.9 % IV SOLN
100.0000 mg | Freq: Two times a day (BID) | INTRAVENOUS | Status: AC
  Administered 2021-06-26 – 2021-06-28 (×4): 100 mg via INTRAVENOUS
  Filled 2021-06-26 (×6): qty 10

## 2021-06-26 MED ORDER — VALPROIC ACID 250 MG/5ML PO SOLN: 500.0000 mg | Freq: Two times a day (BID) | ORAL | Status: DC

## 2021-06-26 NOTE — Progress Notes (Signed)
Patient is s/p Extubation to [place] at 3 L . Patient had an episode of increased work of breathing, unresponsive/obtunded associated with tachypnea  and snoring like breathing lasting less than 2 minutes. She was noted to be post-ictal following the episode.   Seizure Like Activity -Place on BiPAP, wean as tolerated -High risk for re- intubation -Follow intermittent ABG and chest x-ray as needed -Unable to restart her Depakote due to national IV shortage. Currently NPO post extubation -Loaded with IV Vimpat 200 mg, continue maintenance dose 100 mg BID -Seizure precautions -Ativan/Versed prn seizure activity -Discussed with on-call neurology who agrees with above treatment, neurology will continue to follow   Webb Silversmith, DNP, CCRN, FNP-C, AGACNP-BC Acute Care Nurse Practitioner  [facility] Pager: [phone] [facility]  .

## 2021-06-26 NOTE — Consult Note (Signed)
PHARMACY CONSULT NOTE  Pharmacy Consult for Electrolyte Monitoring and Replacement   Recent Labs: Potassium (mmol/L)  Date Value  06/26/2021 4.3  11/19/2014 3.8   Magnesium (mg/dL)  Date Value  72/04/4708 2.4   Calcium (mg/dL)  Date Value  62/83/6629 8.6 (L)   Calcium, Total (mg/dL)  Date Value  47/65/4650 9.4   Albumin (g/dL)  Date Value  35/46/5681 2.5 (L)  11/19/2014 3.9   Phosphorus (mg/dL)  Date Value  27/51/7001 3.0   Sodium (mmol/L)  Date Value  06/26/2021 144  11/19/2014 139   Assessment: Patient is a 60 y/o F with medical history including diabetes, HTN, vertigo, HLD, anxiety, chronic back pain who presented to the ED 11/5 with altered mentation. Patient subsequently found to have elevated lactic acid and leukocytosis without obvious source. There is concern for meningitis and lumbar puncture was performed and patient started on broad spectrum antimicrobial coverage. Patient ultimately required intubation for airway protection due to worsening agitation. Patient is currently admitted to the ICU. Pharmacy consulted to assist with electrolyte monitoring and replacement as indicated.  Goal of Therapy:  Electrolytes within normal limits  Plan:  --no replacement at this time --Follow-up electrolytes with morning labs  Angelique Blonder, PharmD Clinical Pharmacist 06/26/2021 8:58 AM

## 2021-06-26 NOTE — Progress Notes (Addendum)
NAME:  Lauren Velazquez, MRN:  [id number], DOB:  10/30/1960, LOS: 6 ADMISSION DATE:  06/19/2021, INITIAL CONSULTATION DATE:  06/19/2021 REFERRING MD:  Dr. Leonides Schanz Chrisitine, CHIEF COMPLAINT:  Altered Mental Status   Brief Patient Description  60 year old female presenting to [facility] ED from home via EMS for multiple complaints including stroke, chest pain, shortness of breath and pain all over. (Per ED documentation)   ED course: Per ED documentation upon arrival patient was agitated and extremely altered, exhibiting bizarre behavior.  She was screaming and yelling sticking her finger down her throat and attempt to make her self vomit.  While sitting in a recliner began attempting to flip it over eventually requiring physical restraint for her own safety.  ED staff attempted to sedate her as she was kicking and swinging arms at staff and husband.  Her main complaint to the ED provider was chest pain, she stated she was worried that she might have had a heart attack as well as feeling very dehydrated.  She also insisted on being given her chronic pain medication, however she also told staff she will take all her pills and overdose. Patient was placed under IVC.  Per ED documentation, husband denied recreational drug use or any new medications (unclear if this comment was made to EDP or EMS).  While in the ED patient became febrile and sepsis protocol was initiated.  No definitive source of infection noted in CXR/UA, this in combination with severely altered mental status raise concern for meningitis.  Due to patient's increasingly agitated state refractory to benzodiazepines the decision was made by the EDP to mechanically intubate for airway protection in order to provide further sedation so that an LP could be performed safely  Medications given: 50 mg Benadryl, 5 mg Haldol, 2 mg Ativan x2, cefepime/vancomycin/acyclovir, 10 mg of Decadron, total 2.6 L IV fluid bolus, succinylcholine for RSI, 650 mg acetaminophen  for fever Initial Vitals: Severely febrile at 103.9, tachypneic 32, tachycardic 134, hypertensive urgency 232/97 & 95% on room air EKG Interpretation: Date: 06/19/2021, EKG Time: 23:23, Rate: 121, Rhythm: Sinus tachycardia, QRS Axis: RAD, Intervals: Prolonged QTC, ST/T Wave abnormalities: None, Narrative Interpretation: Sinus tachycardia with RAD Chemistry: Na+: 134, K+: 3.9, BUN/Cr.:  9/0.76, Serum CO2/ AG: 28/9 Hematology: WBC: 14.4, Hgb: 12.2,  Normal troponin: 8> 15,  Lactic/ PCT: 4.5/pending, COVID-19 & Influenza A/B: negative ABG: 7.46/32/177/22.8 CXR 06/20/21: mild central vascular congestion CT head wo contrast 06/20/21: no acute intracranial abnormality   PCCM consulted for admission due to need for emergent intubation for airway protection and mechanical ventilatory support.  Pertinent  Medical History  T2DM HTN Vertigo HLD Anxiety Chronic Back pain COVID-19   Significant Hospital Events: Including procedures, antibiotic start and stop dates in addition to other pertinent events   06/20/21- Admit to ICU  with suspected meningitis s/p mechanical intubation for airway protection in the setting of increased sedation needs and agitation. S/p LP @2 :37 AM. ID consult. Patient started empirically on vancomycin, ceftriaxone, ampicillin and acyclovir.  ID consult. 06/21/21- MRI w/out contrast shows acute infarcts (embolic vs. Hypotensive). Neuro, and psych consulted.  TTE showed normal LVEF, grade I diastolic dysfunction, no sig valvular abnormalities and no intracardiac clot.  rEEG showed moderate diffuse slowing with subtle superimposed right focal slowing without epileptiform abnormalities 06/22/21-persistent agitation off of sedation.  Started Depakene for mood stabilization and cyproheptadine for potential serotonergic symptoms. 06/23/21: Remains on the vent and sedated. 06/24/21: Remains on the vent. Neuro eval recs central embolic suspected.  Start  ASA 59m daily + plavix 737mdaily x21  days f/b ASA 8186maily after that.  Consider TEE 06/25/21: Remains on the vent and sedation.  Status post TEE negative for thrombus or vegetation. 06/26/21: S/p extubation now IVC with sitter at the bedside  Cultures:  11/6>> CSF- mild pleocytosis. Culture- no growth at 4 days. VDRL- negative.  11/6>> blood cultures- negative (no growth @4d ) 11/6>> MRSA PCR - negative  11/6>> HSV1/2- negative  11/6>> Crypotococcal Ag - negative  Antimicrobials:  11/6 Vancomycin >>  11/6 Ampicillin >> 11/6 Ceftriaxone >> 11/6 Acyclovir >> 11/6 Cefepime >> x 1 dose Interim History / Subjective:  -S/p extubation -Hemodynamically stable -Post TEE yesterday with no thrombus or vegetation noted -I&O reviewed daily total -275  OBJECTIVE   Blood pressure (!) 157/70, pulse 81, temperature (!) 100.7 F (38.2 C), resp. rate 19, height 5' 1"  (1.549 m), weight 108.9 kg, last menstrual period 08/15/2008, SpO2 98 %.    Vent Mode: PSV FiO2 (%):  [28 %] 28 % Set Rate:  [18 bmp] 18 bmp Vt Set:  [450 mL] 450 mL PEEP:  [5 cmH20] 5 cmH20 Pressure Support:  [5 cmH20-10 cmH20] 5 cmH20 Plateau Pressure:  [10 cmH20-19 cmH20] 10 cmH20   Intake/Output Summary (Last 24 hours) at 06/26/2021 0812 Last data filed at 06/26/2021 0724 Gross per 24 hour  Intake 4838.46 ml  Output 1125 ml  Net 3713.46 ml   Filed Weights   06/24/21 0443 06/25/21 0455 06/26/21 0449  Weight: 108 kg 108.3 kg 108.9 kg   Physical Examination: GENERAL:[age]-year-old patient lying in the bed with no acute distress.  EYES: Pupils equal, round, reactive to light and accommodation. No scleral icterus. Extraocular muscles intact.  HEENT: Head atraumatic, normocephalic. Oropharynx and nasopharynx clear.  NECK:  Supple, no jugular venous distention. No thyroid enlargement, no tenderness.  LUNGS: Normal breath sounds bilaterally, no wheezing, rales,rhonchi or crepitation. No use of accessory muscles of respiration.  CARDIOVASCULAR: S1, S2 normal.  No murmurs, rubs, or gallops.  ABDOMEN: Soft, nontender, nondistended. Bowel sounds present. No organomegaly or mass.  EXTREMITIES: Bilateral lower extremity none dependent edema, cyanosis, or clubbing.  NEUROLOGIC: Cranial nerves II through XII are intact.  Moves all extremities spontaneously . Sensation intact. Gait not checked.  PSYCHIATRIC: The patient is alert unable to assess orientation but nods appropriately to name SKIN: No obvious rash, lesion, or ulcer.   Labs/imaging that I havepersonally reviewed  (right click and "Reselect all SmartList Selections" daily)  CXR 06/20/21: mild central vascular congestion CT head wo contrast 06/20/21: no acute intracranial abnormality CT abd/pelvis 06/20/21: No evidence of inflammatory process or abscess. Evidence of cholelithiasis, bilateral nephrolithiasis, 5.4 cm benign-appearing left adnexal mass.   CSF 06/20/21: mild pleocytosis MR brain w/out 06/21/21: Scattered punctate acute infarcts in both cerebral hemispheres (bilateral PCA and left MCA territories). No associated hemorrhage or mass effect. Although the posterior fossa and anterior cerebral artery territories are spared, the pattern is suspicious for a recent embolic event. Hypotensive episode might also produce this appearance. 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  Echo 06/21/21: EF 60-65%, otherwise normal.  EEG 06/21/21: moderate diffuse slowing, focal right sided slowing, no epileptiform abnormalities.  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 Wide patency of the left carotid artery system within the neck. Widely patent vertebral arteries within the neck.  Labs  CBC: Recent Labs  Lab 06/19/21 1722 06/20/21 0500 06/21/21 1647 06/22/21 0448 06/24/21 1122 06/26/21 0403  WBC 14.4* 13.9* 15.0* 12.3* 8.1 7.7  NEUTROABS 12.9*   --   --   --   --   --   HGB 12.2 10.4* 9.9* 9.4* 8.6* 8.8*  HCT 39.4 34.0* 32.9* 31.0* 29.1* 29.7*  MCV 80.4 81.7 81.2 80.7 84.1 84.1  PLT 507* 419* 423* 383 328 143    Basic Metabolic Panel: Recent Labs  Lab 06/20/21 0135 06/20/21 0500 06/20/21 0733 06/21/21 0524 06/22/21 0448 06/23/21 0509 06/24/21 0447 06/25/21 0425 06/26/21 0403  NA  --   --    < > 140 141 142 145 143 144  K  --   --    < > 3.2* 3.1* 3.5 3.5 3.4* 4.3  CL  --   --    < > 104 104 106 108 108 108  CO2  --   --    < > 27 30 30 31 31 30   GLUCOSE  --   --    < > 152* 182* 145* 172* 143* 193*  BUN  --   --    < > 8 <5* 8 9 10 12   CREATININE  --   --    < > 0.56 0.43* 0.45 0.45 0.35* 0.43*  CALCIUM  --   --    < > 8.7* 8.3* 8.1* 8.2* 8.2* 8.6*  MG 2.0 1.7  --  2.0 2.0  --   --   --  2.4  PHOS 1.2* 3.3  --  3.0 2.5 3.4  --   --  3.0   < > = values in this interval not displayed.   GFR: Estimated Creatinine Clearance: 87.4 mL/min (A) (by C-G formula based on SCr of 0.43 mg/dL (L)). Recent Labs  Lab 06/19/21 2257 06/20/21 0052 06/20/21 0500 06/20/21 0733 06/20/21 1831 06/21/21 1647 06/22/21 0448 06/24/21 1122 06/26/21 0403  PROCALCITON <0.10  --   --   --   --   --   --   --   --   WBC  --   --    < >  --   --  15.0* 12.3* 8.1 7.7  LATICACIDVEN 4.5* 3.4*  --  3.9* 1.4  --   --   --   --    < > = values in this interval not displayed.    Liver Function Tests: Recent Labs  Lab 06/19/21 2257 06/21/21 0524 06/23/21 0509  AST 24 18  --   ALT 13 13  --   ALKPHOS 90 73  --   BILITOT 0.7 0.4  --   PROT 7.3 6.3*  --   ALBUMIN 3.6 2.9* 2.5*   No results for input(s): LIPASE, AMYLASE in the last 168 hours. Recent Labs  Lab 06/20/21 0135  AMMONIA 35    ABG    Component Value Date/Time   PHART 7.46 (H) 06/20/2021 0204   PCO2ART 32 06/20/2021 0204   PO2ART 177 (H) 06/20/2021 0204   HCO3 22.8 06/20/2021 0204   ACIDBASEDEF 0.5 06/20/2021 0204   O2SAT 99.6 06/20/2021 0204     Coagulation  Profile: Recent Labs  Lab 06/20/21 0135  INR 1.1    Cardiac Enzymes: Recent Labs  Lab 06/20/21 0135 06/20/21 0500 06/21/21 0524  CKTOTAL 393* 473* 210    HbA1C: Hgb A1c MFr Bld  Date/Time Value Ref Range Status  06/20/2021 05:00 AM 8.2 (H) 4.8 -  5.6 % Final    Comment:    (NOTE) Pre diabetes:          5.7%-6.4%  Diabetes:              >6.4%  Glycemic control for   <7.0% adults with diabetes   03/06/2020 07:05 AM 9.1 (H) 4.8 - 5.6 % Final    Comment:    (NOTE)         Prediabetes: 5.7 - 6.4         Diabetes: >6.4         Glycemic control for adults with diabetes: <7.0     CBG: Recent Labs  Lab 06/25/21 1619 06/25/21 1926 06/26/21 0022 06/26/21 0347 06/26/21 0709  GLUCAP 140* 151* 219* 155* 199*    Allergies Allergies  Allergen Reactions   Atorvastatin Swelling     Home Medications  Prior to Admission medications   Medication Sig Start Date End Date Taking? Authorizing Provider  diclofenac sodium (VOLTAREN) 1 % GEL Apply 2 g topically 2 (two) times daily as needed (pain).    Yes [provider]  ferrous gluconate (FERGON) 324 MG tablet Take 324 mg by mouth 3 (three) times daily. 04/02/21  Yes [provider]  Fluticasone-Umeclidin-Vilant 100-62.5-25 MCG/ACT AEPB Inhale 1 puff into the lungs daily. 05/18/21  Yes [provider]  gabapentin (NEURONTIN) 300 MG capsule Take 600 mg by mouth 2 (two) times daily.    Yes [provider]  insulin aspart (NOVOLOG) 100 UNIT/ML injection Inject 0-20 Units into the skin 3 (three) times daily with meals. (based upon blood sugar reading)   Yes [provider]  LANTUS SOLOSTAR 100 UNIT/ML Solostar Pen Inject 40 Units into the skin 2 (two) times daily. This is decreased from prior dose of 70 units twice daily due to low blood glucose. 07/22/20  Yes Enzo Bi, MD  lisinopril (ZESTRIL) 40 MG tablet Take 40 mg by mouth daily.    Yes [provider]  oxyCODONE-acetaminophen  (PERCOCET) 10-325 MG tablet Take 1 tablet by mouth every 6 (six) hours as needed for pain.  04/12/17  Yes [provider]  traZODone (DESYREL) 100 MG tablet Take 200 mg by mouth at bedtime as needed for sleep.   Yes [provider]  venlafaxine XR (EFFEXOR-XR) 75 MG 24 hr capsule Take 75-150 mg by mouth 2 (two) times daily.    Yes [provider]  VENTOLIN HFA 108 (90 Base) MCG/ACT inhaler Inhale 2 puffs into the lungs every 6 (six) hours as needed for wheezing or shortness of breath.    Yes [provider]  Scheduled Meds:  amLODipine  5 mg Per Tube Daily   arformoterol  15 mcg Nebulization BID   aspirin  81 mg Per Tube Daily   budesonide (PULMICORT) nebulizer solution  0.25 mg Nebulization BID   chlorhexidine gluconate (MEDLINE KIT)  15 mL Mouth Rinse BID   Chlorhexidine Gluconate Cloth  6 each Topical Q0600   clopidogrel  75 mg Per Tube Daily   docusate  100 mg Per Tube BID   enoxaparin (LOVENOX) injection  0.5 mg/kg Subcutaneous Q24H   ezetimibe  10 mg Per Tube Daily   feeding supplement (PROSource TF)  45 mL Per Tube TID   feeding supplement (VITAL HIGH PROTEIN)  1,000 mL Per Tube Q24H   free water  150 mL Per Tube Q4H   gabapentin  600 mg Per Tube BID   insulin aspart  0-20 Units Subcutaneous Q4H  insulin glargine-yfgn  40 Units Subcutaneous BID   mouth rinse  15 mL Mouth Rinse 10 times per day   multivitamin  15 mL Per Tube Daily   polyethylene glycol  17 g Per Tube Daily   valproic acid  500 mg Per Tube BID   Continuous Infusions:  sodium chloride 10 mL/hr at 06/26/21 0447   sodium chloride     ampicillin (OMNIPEN) IV 2 g (06/26/21 0724)   cefTRIAXone (ROCEPHIN)  IV Stopped (06/25/21 2156)   dexmedetomidine (PRECEDEX) IV infusion 0.6 mcg/kg/hr (06/26/21 0800)   famotidine (PEPCID) IV Stopped (06/25/21 2155)   fentaNYL infusion INTRAVENOUS 200 mcg/hr (06/26/21 0813)   propofol (DIPRIVAN) infusion Stopped (06/25/21 0826)   PRN Meds:.sodium  chloride, albuterol, diclofenac Sodium, fentaNYL, hydrALAZINE, ibuprofen, midazolam, polyethylene glycol  Resolved Hospital Problem list     ASSESSMENT & PLAN  60 yo woman  admitted with acute encephalopathy in the setting of suspected meningitis requiring intubation for airway protection now complicated by 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  Acute Hypoxic Respiratory Failure secondary to sedation requirements in the setting of severe agitation and encephalopathy- Resolving -S/p extubation  -Supplemental O2 to maintain SpO2 > 90% -Intermittent chest x-ray & ABG PRN -Ensure adequate pulmonary hygiene  -Budesonide inhaler/nebs BID, bronchodilators PRN -Continue Precedex gtt for agitation wean as tolerated  Sepsis without septic shock due to suspected meningitis versus viral illness Lactic: 4.5-->3.9>> 1.4, Baseline PCT: <0.10, UA: +ketones +protein, CXR: mild central vascular congestion, CTH: negative, LP: mild pleocytosis  Febrile illness? viral illness versus serotonin syndrome: Resolved -Initial interventions/workup included: 1.6 L of LR & 1 L NS & Cefepime/ Vancomycin/ acyclovir -Supplemental oxygen as needed, to maintain SpO2 > 90% -f/u cultures, trend lactic/ PCT, f/u CK level -Daily CBC, monitor WBC/ fever curve -IV antibiotics for meningitis coverage: ceftriaxone, vancomycin, ampicillin & acyclovir -IVF hydration as needed: LR @ 75 mL/h -Strict I/O's: alert provider if UOP < 0.5 mL/kg/hr -ID following, input appreciated  Acute Encephalopathy secondary to suspected meningitis R/O Serotonin syndrome, manic episode PMHx: Anxiety, chronic pain (w/ opioid dependence)  -supportive care -Per ID- CSF shows mild pleocytosis (lymphatic), unlikely bacterial (continue abx & acyclovir for now), discontinue antibiotics if CSF studies negative. -EEG results pending -neurology consult- appreciate input for MRI and CSF findings -continue outpatient:  gabapentin, discontinued trazodone (query serotonin syndrome) -Continue to hold Effexor for now continue restarting pending formal psych eval -Continue Depakene for mood stabilization, cyproheptadine for potential serotonin excess issues -STOP: Trazodone, Zofran (serotonin syndrome potential) particularly with concomitant use of fentanyl -Appreciate input from psychiatry (cannot rule out manic episode) -Currently IVC, maintain sitter at the bedside    Acute bilateral punctate infarcts (embolic vs. hypotensive episode)   Etiology: embolic vs. hypotensive (80s-90s/50s-60s) & bradycardic episode overnight (0000-0100) may have lead to hypotensive findings on MRI (MRI at 0357). Normal echo, no evidence of AFIB on EKG/continuous monitor.  -Continue  ASA 30m daily + plavix 722mdaily x21 days f/b ASA 8112maily after that -MRAshowed <50% carotid stenosis bilat with no other hemodynamically significant stenoses  -TEE negative for thrombus or vegetation -Check LDL + add statin per guidelines -q4 hr neuro checks -STAT head CT for any change in neuro exam -PT/OT/SLP when patient able to participate  Hypertension Urgency (improved) PMHx: HLD, HTN -Do believe this will be better controlled with improved sedation & fever control -hydralazine PRN Q 4 -continuous cardiac monitoring -hold outpatient lisinopril, consider restarting as patient stabilizes  Poorly controlled Type 2 Diabetes Mellitus -Hemoglobin A1C: 8.2 -Monitor CBG Q 4 hours -SSI moderate dosing -continue outpatient Lantus dosing: 40 units BID -target range while in ICU: 140-180 -follow ICU hyper/hypo-glycemia protocol   Best practice (right click and "Reselect all SmartList Selections" daily)  Diet:  NPO Pain/Anxiety/Delirium protocol (if indicated): Yes (RASS goal -1) VAP protocol (if indicated): Not indicated DVT prophylaxis: LMWH GI prophylaxis: H2B Glucose control:  SSI Yes Central venous access:  Yes, and it is still  needed Arterial line:  Yes, and it is still needed Foley:  Yes, and it is still needed Mobility:  bed rest  PT consulted: Yes Last date of multidisciplinary goals of care discussion [06/26/21] Code Status:  full code Disposition: ICU  Critical care time: 35 minutes       Rufina Falco, DNP, FNP-C, AGACNP-BC Acute Care Nurse Practitioner  [facility] Pager: [phone] [place] [facility]

## 2021-06-26 NOTE — Progress Notes (Signed)
Neurology progress note  S: Patient had an episode after extubation today involving increased WOB and obtundation f/b snoring and AMS felt to be post-ictal. She was previously on VPA but is now npo and was loaded on vimpat and started on 100mg  bid. She is alert and following commands on my exam.  Interval data:  MRA H&N: R prox ICA 40-50% narrowing, no other hemodynamically significant stenoses in head or neck (personal review)  TEE: no intracardiac clot or other significant abnl  Stroke Labs     Component Value Date/Time   CHOL 197 06/25/2021 1035   TRIG 189 (H) 06/25/2021 1035   HDL 26 (L) 06/25/2021 1035   CHOLHDL 7.6 06/25/2021 1035   VLDL 38 06/25/2021 1035   LDLCALC 133 (H) 06/25/2021 1035    Lab Results  Component Value Date/Time   HGBA1C 8.2 (H) 06/20/2021 05:00 AM   O:  Vitals:   06/26/21 1800 06/26/21 1925  BP: (!) 161/58   Pulse: (!) 55   Resp: (!) 22   Temp:  98.2 F (36.8 C)  SpO2: 100%    Physical exam: Gen: alert, on bipap, following commands Resp: increased WOB on bipap CV: RRR  MS: alert, on bipap, following commands. Orientation not assessed 2/2 pt currently requiring bipap Speech: on bipap CN: PERRL, EOMI, blinks to threat bilat, face symmetric at rest, hearing intact to voice Motor: anti-gravity in all extremities to command Sensory: SILT Reflexes: 1+ and symmetric throughout  A/P: This is a 60 yo woman with pmhx significant for DM2, HTN, vertigo, AMS admitted with acute encephalopathy requiring intubation for airway protection on whom neurology is consulted for workup of multifocal acute ischemic strokes favored to be central embolic, although workup for source incl TEE negative.    Recommendations    - Goal normotension, avoid hypotension if at all possible to avoid extension of current areas of infarct - Continue vimpat 100mg  bid until outpatient f/u with neurology. No indication for cEEG at present given improvement in mental status. -  ASA 81mg  daily + plavix 75mg  daily x21 days f/b ASA 81mg  daily after that - Atorvastatin 80mg  daily - STAT head CT for any change in neuro exam - Tele - PT/OT/SLP when patient able to participate - Ambulatory cardiac monitor at hospital discharge   No further recommendations for inpatient neurologic workup or mgmt at this time. Neurology will not continue to actively follow, but please re-engage if new neurologic concerns arise. Please enter ambulatory referral to neurology in discharge orders at time of discharge and our office will call patient to arrange outpatient neurology follow-up.  Thank you for the opportunity to participate in this patient's care.  41, MD [facility] [phone]  If 7pm- 7am, please page neurology on call as listed in AMION.

## 2021-06-26 NOTE — Progress Notes (Signed)
Patient extubated to 3l [place] per MD order. Patient tolerated well and saturations at this time are 94% on [place].

## 2021-06-27 DIAGNOSIS — R410 Disorientation, unspecified: Secondary | ICD-10-CM | POA: Diagnosis not present

## 2021-06-27 LAB — CBC
HCT: 29.5 % — ABNORMAL LOW (ref 36.0–46.0)
Hemoglobin: 8.8 g/dL — ABNORMAL LOW (ref 12.0–15.0)
MCH: 24.2 pg — ABNORMAL LOW (ref 26.0–34.0)
MCHC: 29.8 g/dL — ABNORMAL LOW (ref 30.0–36.0)
MCV: 81 fL (ref 80.0–100.0)
Platelets: 372 10*3/uL (ref 150–400)
RBC: 3.64 MIL/uL — ABNORMAL LOW (ref 3.87–5.11)
RDW: 15.9 % — ABNORMAL HIGH (ref 11.5–15.5)
WBC: 13.3 10*3/uL — ABNORMAL HIGH (ref 4.0–10.5)
nRBC: 0 % (ref 0.0–0.2)

## 2021-06-27 LAB — GLUCOSE, CAPILLARY
Glucose-Capillary: 131 mg/dL — ABNORMAL HIGH (ref 70–99)
Glucose-Capillary: 141 mg/dL — ABNORMAL HIGH (ref 70–99)
Glucose-Capillary: 157 mg/dL — ABNORMAL HIGH (ref 70–99)
Glucose-Capillary: 158 mg/dL — ABNORMAL HIGH (ref 70–99)
Glucose-Capillary: 48 mg/dL — ABNORMAL LOW (ref 70–99)
Glucose-Capillary: 53 mg/dL — ABNORMAL LOW (ref 70–99)
Glucose-Capillary: 53 mg/dL — ABNORMAL LOW (ref 70–99)
Glucose-Capillary: 54 mg/dL — ABNORMAL LOW (ref 70–99)
Glucose-Capillary: 64 mg/dL — ABNORMAL LOW (ref 70–99)
Glucose-Capillary: 90 mg/dL (ref 70–99)
Glucose-Capillary: 98 mg/dL (ref 70–99)

## 2021-06-27 LAB — COMPREHENSIVE METABOLIC PANEL
ALT: 18 U/L (ref 0–44)
AST: 32 U/L (ref 15–41)
Albumin: 2.9 g/dL — ABNORMAL LOW (ref 3.5–5.0)
Alkaline Phosphatase: 61 U/L (ref 38–126)
Anion gap: 8 (ref 5–15)
BUN: 10 mg/dL (ref 6–20)
CO2: 31 mmol/L (ref 22–32)
Calcium: 8.6 mg/dL — ABNORMAL LOW (ref 8.9–10.3)
Chloride: 102 mmol/L (ref 98–111)
Creatinine, Ser: 0.53 mg/dL (ref 0.44–1.00)
GFR, Estimated: >60 mL/min (ref >60)
Glucose, Bld: 98 mg/dL (ref 70–99)
Potassium: 3.2 mmol/L — ABNORMAL LOW (ref 3.5–5.1)
Sodium: 141 mmol/L (ref 135–145)
Total Bilirubin: 0.3 mg/dL (ref 0.3–1.2)
Total Protein: 6.4 g/dL — ABNORMAL LOW (ref 6.5–8.1)

## 2021-06-27 LAB — PHOSPHORUS: Phosphorus: 2.8 mg/dL (ref 2.5–4.6)

## 2021-06-27 LAB — MAGNESIUM: Magnesium: 2.1 mg/dL (ref 1.7–2.4)

## 2021-06-27 LAB — C-REACTIVE PROTEIN: CRP: 4.1 mg/dL — ABNORMAL HIGH (ref <1.0)

## 2021-06-27 LAB — RPR: RPR Ser Ql: NONREACTIVE

## 2021-06-27 MED ORDER — DEXTROSE 50 % IV SOLN
25.0000 g | INTRAVENOUS | Status: AC
  Administered 2021-06-27: 25 g via INTRAVENOUS

## 2021-06-27 MED ORDER — POTASSIUM CHLORIDE 10 MEQ/50ML IV SOLN
10.0000 meq | INTRAVENOUS | Status: AC
  Administered 2021-06-27 (×3): 10 meq via INTRAVENOUS
  Filled 2021-06-27 (×3): qty 50

## 2021-06-27 MED ORDER — DEXTROSE 50 % IV SOLN
INTRAVENOUS | Status: AC
  Administered 2021-06-27: 25 g via INTRAVENOUS
  Filled 2021-06-27: qty 50

## 2021-06-27 MED ORDER — DEXTROSE IN LACTATED RINGERS 5 % IV SOLN: INTRAVENOUS | Status: DC

## 2021-06-27 NOTE — Progress Notes (Addendum)
NAME:  Lauren Velazquez, MRN:  [id number], DOB:  07/04/1961, LOS: 7 ADMISSION DATE:  06/19/2021, INITIAL CONSULTATION DATE:  06/19/2021 REFERRING MD:  Dr. Leonides Schanz Chrisitine, CHIEF COMPLAINT:  Altered Mental Status   Brief Patient Description  60 year old female presenting to [facility] ED from home via EMS for multiple complaints including stroke, chest pain, shortness of breath and pain all over. (Per ED documentation)   ED course: Per ED documentation upon arrival patient was agitated and extremely altered, exhibiting bizarre behavior.  She was screaming and yelling sticking her finger down her throat and attempt to make her self vomit.  While sitting in a recliner began attempting to flip it over eventually requiring physical restraint for her own safety.  ED staff attempted to sedate her as she was kicking and swinging arms at staff and husband.  Her main complaint to the ED provider was chest pain, she stated she was worried that she might have had a heart attack as well as feeling very dehydrated.  She also insisted on being given her chronic pain medication, however she also told staff she will take all her pills and overdose. Patient was placed under IVC.  Per ED documentation, husband denied recreational drug use or any new medications (unclear if this comment was made to EDP or EMS).  While in the ED patient became febrile and sepsis protocol was initiated.  No definitive source of infection noted in CXR/UA, this in combination with severely altered mental status raise concern for meningitis.  Due to patient's increasingly agitated state refractory to benzodiazepines the decision was made by the EDP to mechanically intubate for airway protection in order to provide further sedation so that an LP could be performed safely  Medications given: 50 mg Benadryl, 5 mg Haldol, 2 mg Ativan x2, cefepime/vancomycin/acyclovir, 10 mg of Decadron, total 2.6 L IV fluid bolus, succinylcholine for RSI, 650 mg acetaminophen  for fever Initial Vitals: Severely febrile at 103.9, tachypneic 32, tachycardic 134, hypertensive urgency 232/97 & 95% on room air EKG Interpretation: Date: 06/19/2021, EKG Time: 23:23, Rate: 121, Rhythm: Sinus tachycardia, QRS Axis: RAD, Intervals: Prolonged QTC, ST/T Wave abnormalities: None, Narrative Interpretation: Sinus tachycardia with RAD Chemistry: Na+: 134, K+: 3.9, BUN/Cr.:  9/0.76, Serum CO2/ AG: 28/9 Hematology: WBC: 14.4, Hgb: 12.2,  Normal troponin: 8> 15,  Lactic/ PCT: 4.5/pending, COVID-19 & Influenza A/B: negative ABG: 7.46/32/177/22.8 CXR 06/20/21: mild central vascular congestion CT head wo contrast 06/20/21: no acute intracranial abnormality   PCCM consulted for admission due to need for emergent intubation for airway protection and mechanical ventilatory support.  Pertinent  Medical History  T2DM HTN Vertigo HLD Anxiety Chronic Back pain COVID-19   Significant Hospital Events: Including procedures, antibiotic start and stop dates in addition to other pertinent events   06/20/21- Admit to ICU  with suspected meningitis s/p mechanical intubation for airway protection in the setting of increased sedation needs and agitation. S/p LP @2 :37 AM. ID consult. Patient started empirically on vancomycin, ceftriaxone, ampicillin and acyclovir.  ID consult. 06/21/21- MRI w/out contrast shows acute infarcts (embolic vs. Hypotensive). Neuro, and psych consulted.  TTE showed normal LVEF, grade I diastolic dysfunction, no sig valvular abnormalities and no intracardiac clot.  rEEG showed moderate diffuse slowing with subtle superimposed right focal slowing without epileptiform abnormalities 06/22/21-persistent agitation off of sedation.  Started Depakene for mood stabilization and cyproheptadine for potential serotonergic symptoms. 06/23/21: Remains on the vent and sedated. 06/24/21: Remains on the vent. Neuro eval recs central embolic suspected.  Start  ASA 74m daily + plavix 745mdaily x21  days f/b ASA 8137maily after that.  Consider TEE 06/25/21: Remains on the vent and sedation.  Status post TEE negative for thrombus or vegetation. 06/26/21: S/p extubation now IVC with sitter at the bedside 06/28/11: Stable on [place] @2L  with fluctuating mental status. Sitter at bedside  Cultures:  11/6>> CSF- mild pleocytosis. Culture- no growth at 4 days. VDRL- negative.  11/6>> blood cultures- negative (no growth @4d ) 11/6>> MRSA PCR - negative  11/6>> HSV1/2- negative  11/6>> Crypotococcal Ag - negative  Antimicrobials:  11/6 Vancomycin >>  11/6 Ampicillin >> 11/6 Ceftriaxone >> 11/6 Acyclovir >> 11/6 Cefepime >> x 1 dose Interim History / Subjective:  -Remains stable on 2L -Hemodynamically stable -I&O reviewed daily total -275  OBJECTIVE   Blood pressure (!) 104/50, pulse (!) 50, temperature 99.6 F (37.6 C), temperature source Axillary, resp. rate (!) 33, height 5' 1"  (1.549 m), weight 109 kg, last menstrual period 08/15/2008, SpO2 99 %.    FiO2 (%):  [28 %] 28 %   Intake/Output Summary (Last 24 hours) at 06/27/2021 1138 Last data filed at 06/27/2021 1000 Gross per 24 hour  Intake 1317.68 ml  Output 3875 ml  Net -2557.32 ml    Filed Weights   06/25/21 0455 06/26/21 0449 06/27/21 0500  Weight: 108.3 kg 108.9 kg 109 kg   Physical Examination: GENERAL:[age]-year-old patient lying in the bed with no acute distress.  EYES: Pupils equal, round, reactive to light and accommodation. No scleral icterus. Extraocular muscles intact.  HEENT: Head atraumatic, normocephalic. Oropharynx and nasopharynx clear.  NECK:  Supple, no jugular venous distention. No thyroid enlargement, no tenderness.  LUNGS: Normal breath sounds bilaterally, no wheezing, rales,rhonchi or crepitation. No use of accessory muscles of respiration.  CARDIOVASCULAR: S1, S2 normal. No murmurs, rubs, or gallops.  ABDOMEN: Soft, nontender, nondistended. Bowel sounds present. No organomegaly or mass.   EXTREMITIES: Bilateral lower extremity none dependent edema, cyanosis, or clubbing.  NEUROLOGIC: Cranial nerves II through XII are intact.  Moves all extremities spontaneously . Sensation intact. Gait not checked.  PSYCHIATRIC: The patient is alert unable to assess orientation but nods appropriately to name SKIN: No obvious rash, lesion, or ulcer.   Labs/imaging that I havepersonally reviewed  (right click and "Reselect all SmartList Selections" daily)  CXR 06/20/21: mild central vascular congestion CT head wo contrast 06/20/21: no acute intracranial abnormality CT abd/pelvis 06/20/21: No evidence of inflammatory process or abscess. Evidence of cholelithiasis, bilateral nephrolithiasis, 5.4 cm benign-appearing left adnexal mass.   CSF 06/20/21: mild pleocytosis MR brain w/out 06/21/21: Scattered punctate acute infarcts in both cerebral hemispheres (bilateral PCA and left MCA territories). No associated hemorrhage or mass effect. Although the posterior fossa and anterior cerebral artery territories are spared, the pattern is suspicious for a recent embolic event. Hypotensive episode might also produce this appearance. 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  Echo 06/21/21: EF 60-65%, otherwise normal.  EEG 06/21/21: moderate diffuse slowing, focal right sided slowing, no epileptiform abnormalities.  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 Wide patency of the left carotid artery system within the neck. Widely patent vertebral arteries within the neck.  Labs   CBC: Recent Labs  Lab 06/21/21 1647 06/22/21 0448 06/24/21 1122 06/26/21 0403 06/27/21 0507  WBC 15.0* 12.3* 8.1 7.7 13.3*  HGB 9.9* 9.4* 8.6* 8.8* 8.8*  HCT 32.9* 31.0* 29.1* 29.7* 29.5*  MCV 81.2 80.7 84.1 84.1 81.0  PLT 423* 383 328 335 372     Basic Metabolic  Panel: Recent Labs  Lab 06/21/21 0524 06/22/21 0448 06/23/21 0509 06/24/21 0447 06/25/21 0425 06/26/21 0403 06/27/21 0507  NA 140 141 142 145 143 144 141  K 3.2* 3.1* 3.5 3.5 3.4* 4.3 3.2*  CL 104 104 106 108 108 108 102  CO2 27 30 30 31 31 30 31   GLUCOSE 152* 182* 145* 172* 143* 193* 98  BUN 8 <5* 8 9 10 12 10   CREATININE 0.56 0.43* 0.45 0.45 0.35* 0.43* 0.53  CALCIUM 8.7* 8.3* 8.1* 8.2* 8.2* 8.6* 8.6*  MG 2.0 2.0  --   --   --  2.4 2.1  PHOS 3.0 2.5 3.4  --   --  3.0 2.8    GFR: Estimated Creatinine Clearance: 87.5 mL/min (by C-G formula based on SCr of 0.53 mg/dL). Recent Labs  Lab 06/20/21 1831 06/21/21 1647 06/22/21 0448 06/24/21 1122 06/26/21 0403 06/27/21 0507  WBC  --    < > 12.3* 8.1 7.7 13.3*  LATICACIDVEN 1.4  --   --   --   --   --    < > = values in this interval not displayed.     Liver Function Tests: Recent Labs  Lab 06/21/21 0524 06/23/21 0509 06/27/21 0507  AST 18  --  32  ALT 13  --  18  ALKPHOS 73  --  61  BILITOT 0.4  --  0.3  PROT 6.3*  --  6.4*  ALBUMIN 2.9* 2.5* 2.9*    No results for input(s): LIPASE, AMYLASE in the last 168 hours. No results for input(s): AMMONIA in the last 168 hours.   ABG    Component Value Date/Time   PHART 7.46 (H) 06/20/2021 0204   PCO2ART 32 06/20/2021 0204   PO2ART 177 (H) 06/20/2021 0204   HCO3 40.1 (H) 06/26/2021 1950   ACIDBASEDEF 0.5 06/20/2021 0204   O2SAT 76.9 06/26/2021 1950      Coagulation Profile: No results for input(s): INR, PROTIME in the last 168 hours.   Cardiac Enzymes: Recent Labs  Lab 06/21/21 0524  CKTOTAL 210     HbA1C: Hgb A1c MFr Bld  Date/Time Value Ref Range Status  06/20/2021 05:00 AM 8.2 (H) 4.8 - 5.6 % Final    Comment:    (NOTE) Pre diabetes:          5.7%-6.4%  Diabetes:              >6.4%  Glycemic control for   <7.0% adults with diabetes   03/06/2020 07:05 AM 9.1 (H) 4.8 - 5.6 % Final    Comment:    (NOTE)         Prediabetes: 5.7 - 6.4          Diabetes: >6.4         Glycemic control for adults with diabetes: <7.0     CBG: Recent Labs  Lab 06/27/21 0332 06/27/21 0354 06/27/21 0747 06/27/21 0820 06/27/21 1116  GLUCAP 54* 157* 53* 131* 64*     Allergies Allergies  Allergen Reactions   Atorvastatin Swelling      Home Medications  Prior to Admission medications   Medication Sig Start Date End Date Taking? Authorizing Provider  diclofenac sodium (VOLTAREN) 1 % GEL Apply 2 g topically 2 (two) times daily as needed (pain).    Yes [provider]  ferrous gluconate (FERGON) 324 MG tablet Take 324 mg by mouth 3 (three) times daily. 04/02/21  Yes [provider]  Fluticasone-Umeclidin-Vilant 100-62.5-25 MCG/ACT AEPB Inhale 1 puff into the lungs daily. 05/18/21  Yes [provider]  gabapentin (NEURONTIN) 300 MG capsule Take 600 mg by mouth 2 (two) times daily.    Yes [provider]  insulin aspart (NOVOLOG) 100 UNIT/ML injection Inject 0-20 Units into the skin 3 (three) times daily with meals. (based upon blood sugar reading)   Yes [provider]  LANTUS SOLOSTAR 100 UNIT/ML Solostar Pen Inject 40 Units into the skin 2 (two) times daily. This is decreased from prior dose of 70 units twice daily due to low blood glucose. 07/22/20  Yes Enzo Bi, MD  lisinopril (ZESTRIL) 40 MG tablet Take 40 mg by mouth daily.    Yes [provider]  oxyCODONE-acetaminophen (PERCOCET) 10-325 MG tablet Take 1 tablet by mouth every 6 (six) hours as needed for pain.  04/12/17  Yes [provider]  traZODone (DESYREL) 100 MG tablet Take 200 mg by mouth at bedtime as needed for sleep.   Yes [provider]  venlafaxine XR (EFFEXOR-XR) 75 MG 24 hr capsule Take 75-150 mg by mouth 2 (two) times daily.    Yes [provider]  VENTOLIN HFA 108 (90 Base) MCG/ACT inhaler Inhale 2 puffs into the lungs every 6 (six) hours as needed for wheezing or shortness of breath.    Yes  [provider]  Scheduled Meds:  amLODipine  5 mg Per Tube Daily   arformoterol  15 mcg Nebulization BID   aspirin  81 mg Per Tube Daily   budesonide (PULMICORT) nebulizer solution  0.25 mg Nebulization BID   chlorhexidine gluconate (MEDLINE KIT)  15 mL Mouth Rinse BID   Chlorhexidine Gluconate Cloth  6 each Topical Q0600   clopidogrel  75 mg Per Tube Daily   dextrose  25 g Intravenous STAT   enoxaparin (LOVENOX) injection  0.5 mg/kg Subcutaneous Q24H   ezetimibe  10 mg Per Tube Daily   feeding supplement (PROSource TF)  45 mL Per Tube TID   feeding supplement (VITAL HIGH PROTEIN)  1,000 mL Per Tube Q24H   gabapentin  600 mg Per Tube BID   insulin aspart  0-20 Units Subcutaneous Q4H   insulin glargine-yfgn  40 Units Subcutaneous BID   multivitamin  15 mL Per Tube Daily   Continuous Infusions:  sodium chloride 5 mL/hr at 06/27/21 0532   sodium chloride     ampicillin (OMNIPEN) IV 2 g (06/27/21 0741)   cefTRIAXone (ROCEPHIN)  IV 2 g (06/27/21 0905)   dexmedetomidine (PRECEDEX) IV infusion 0.9 mcg/kg/hr (06/27/21 1039)   famotidine (PEPCID) IV 20 mg (06/27/21 0939)   lacosamide (VIMPAT) IV 100 mg (06/27/21 1041)   potassium chloride 10 mEq (06/27/21 1113)   PRN Meds:.sodium chloride, albuterol, diclofenac Sodium, hydrALAZINE, ibuprofen, morphine injection, polyethylene glycol  Resolved Hospital Problem list     ASSESSMENT & PLAN  60 yo woman  admitted with acute encephalopathy in the setting of suspected meningitis requiring intubation for airway protection now complicated by 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  Acute Hypoxic Respiratory Failure secondary to sedation requirements in the setting of severe agitation and encephalopathy- Resolving -S/p extubation 11/12 -Supplemental O2 to maintain SpO2 > 90% -Intermittent chest x-ray & ABG PRN -Ensure adequate pulmonary hygiene  -Budesonide inhaler/nebs BID, bronchodilators  PRN -Continue Precedex gtt for agitation wean  as tolerated  Sepsis without septic shock due to suspected meningitis versus viral illness Lactic: 4.5-->3.9>> 1.4, Baseline PCT: <0.10, UA: +ketones +protein, CXR: mild central vascular congestion, CTH: negative, LP: mild pleocytosis  Febrile illness? viral illness versus serotonin syndrome: Resolved -Initial interventions/workup included: 1.6 L of LR & 1 L NS & Cefepime/ Vancomycin/ acyclovir -Supplemental oxygen as needed, to maintain SpO2 > 90% -f/u cultures, trend lactic/ PCT, f/u CK level -Daily CBC, monitor WBC/ fever curve -IV antibiotics for meningitis coverage: ceftriaxone, vancomycin, ampicillin & acyclovir -IVF hydration as needed: LR @ 75 mL/h -Strict I/O's: alert provider if UOP < 0.5 mL/kg/hr -ID following, input appreciated  Seizure Like Activity -Unable to restart her Depakote due to national IV shortage. Currently NPO pending SLT -Loaded with IV Vimpat 200 mm x 1 on 11/12 -Continue vimpat 131m bid until outpatient f/u with neurology. No indication for cEEG at present given improvement in mental status per Neurology -Seizure precautions -Ativan/Versed prn seizure activity -Neurology input appreciated.  Acute Encephalopathy secondary to suspected meningitis R/O Serotonin syndrome, manic episode PMHx: Anxiety, chronic pain (w/ opioid dependence)  -supportive care -Per ID- CSF shows mild pleocytosis (lymphatic), unlikely bacterial (continue abx & acyclovir for now), discontinue antibiotics if CSF studies negative. -neurology consult- appreciate input for MRI and CSF findings -continue outpatient: gabapentin, discontinued trazodone (query serotonin syndrome) -Continue to hold Effexor for now continue restarting pending formal psych eval -Continue Depakene for mood stabilization, cyproheptadine for potential serotonin excess issues -STOP: Trazodone, Zofran (serotonin syndrome potential) particularly with concomitant use of  fentanyl -Appreciate input from psychiatry (cannot rule out manic episode) -Currently IVC, maintain sitter at the bedside    Acute bilateral punctate infarcts (embolic vs. hypotensive episode)   Etiology: embolic vs. hypotensive (80s-90s/50s-60s) & bradycardic episode overnight (0000-0100) may have lead to hypotensive findings on MRI (MRI at [address]. Normal echo, no evidence of AFIB on EKG/continuous monitor.  -Continue  ASA 819mdaily + plavix 7522maily x21 days f/b ASA 21m78mily after that -MRAshowed <50% carotid stenosis bilat with no other hemodynamically significant stenoses  -TEE negative for thrombus or vegetation -statin per guidelines -q4 hr neuro checks -STAT head CT for any change in neuro exam -PT/OT/SLP when patient able to participate  Hypertension Urgency (improved) PMHx: HLD, HTN -Do believe this will be better controlled with improved sedation & fever control -hydralazine PRN Q 4 -continuous cardiac monitoring -hold outpatient lisinopril, consider restarting as patient stabilizes   Poorly controlled Type 2 Diabetes Mellitus -Hemoglobin A1C: 8.2 -Monitor CBG Q 4 hours -SSI moderate dosing -continue outpatient Lantus dosing: 40 units BID -target range while in ICU: 140-180 -follow ICU hyper/hypo-glycemia protocol   Best practice (right click and "Reselect all SmartList Selections" daily)  Diet:  NPO Pain/Anxiety/Delirium protocol (if indicated): Yes (RASS goal -1) VAP protocol (if indicated): Not indicated DVT prophylaxis: LMWH GI prophylaxis: H2B Glucose control:  SSI Yes Central venous access:  Yes, and it is still needed Arterial line:  NO Foley:  Yes, and it is still needed Mobility:  bed rest  PT consulted: Yes Last date of multidisciplinary goals of care discussion [06/26/21] Code Status:  full code Disposition: ICU  Critical care time: 35 minutes       ElizRufina FalcoP, FNP-C, AGACNP-BC Acute Care Nurse Practitioner  [facility] Pager: [phone] [facility]

## 2021-06-27 NOTE — TOC Progression Note (Addendum)
Transition of Care [facility]) - Progression Note    Patient Details  Name: Lauren Velazquez MRN: [id number] Date of Birth: 1961-05-14  Transition of Care [facility] CM/SW Contact  Marina Goodell Phone Number: [phone] 06/27/2021, 1:29 PM  Clinical Narrative:     Patient extubated, currently on 2L nasal Cannula, remains w/ fluctuating mental stats, w/ sitter and IVC. Main contact Anne Ng, daughter [phone].          Expected Discharge Plan and Services                                                 Social Determinants of Health (SDOH) Interventions    Readmission Risk Interventions No flowsheet data found.

## 2021-06-27 NOTE — Plan of Care (Signed)

## 2021-06-27 NOTE — Consult Note (Signed)
PHARMACY CONSULT NOTE  Pharmacy Consult for Electrolyte Monitoring and Replacement   Recent Labs: Potassium (mmol/L)  Date Value  06/27/2021 3.2 (L)  11/19/2014 3.8   Magnesium (mg/dL)  Date Value  77/82/4235 2.1   Calcium (mg/dL)  Date Value  36/14/4315 8.6 (L)   Calcium, Total (mg/dL)  Date Value  40/03/6760 9.4   Albumin (g/dL)  Date Value  95/04/3266 2.9 (L)  11/19/2014 3.9   Phosphorus (mg/dL)  Date Value  12/45/8099 2.8   Sodium (mmol/L)  Date Value  06/27/2021 141  11/19/2014 139   Assessment: Patient is a 60 y/o F with medical history including diabetes, HTN, vertigo, HLD, anxiety, chronic back pain who presented to the ED 11/5 with altered mentation. Patient subsequently found to have elevated lactic acid and leukocytosis without obvious source. There is concern for meningitis and lumbar puncture was performed and patient started on broad spectrum antimicrobial coverage. Patient ultimately required intubation for airway protection due to worsening agitation. Patient is currently admitted to the ICU. Pharmacy consulted to assist with electrolyte monitoring and replacement as indicated.  Goal of Therapy:  Electrolytes within normal limits  Plan:  K 3.2  Patient received lasix 40 mg IV x 1 on 11/12 --Will order KCL 10 meq IV x 3 doses --Follow-up electrolytes with morning labs  Angelique Blonder, PharmD Clinical Pharmacist 06/27/2021 9:47 AM

## 2021-06-28 DIAGNOSIS — A419 Sepsis, unspecified organism: Principal | ICD-10-CM

## 2021-06-28 LAB — CBC
HCT: 29.7 % — ABNORMAL LOW (ref 36.0–46.0)
Hemoglobin: 8.8 g/dL — ABNORMAL LOW (ref 12.0–15.0)
MCH: 24.4 pg — ABNORMAL LOW (ref 26.0–34.0)
MCHC: 29.6 g/dL — ABNORMAL LOW (ref 30.0–36.0)
MCV: 82.3 fL (ref 80.0–100.0)
Platelets: 399 10*3/uL (ref 150–400)
RBC: 3.61 MIL/uL — ABNORMAL LOW (ref 3.87–5.11)
RDW: 16 % — ABNORMAL HIGH (ref 11.5–15.5)
WBC: 11.8 10*3/uL — ABNORMAL HIGH (ref 4.0–10.5)
nRBC: 0.2 % (ref 0.0–0.2)

## 2021-06-28 LAB — BASIC METABOLIC PANEL
Anion gap: 9 (ref 5–15)
BUN: 10 mg/dL (ref 6–20)
CO2: 28 mmol/L (ref 22–32)
Calcium: 8.6 mg/dL — ABNORMAL LOW (ref 8.9–10.3)
Chloride: 103 mmol/L (ref 98–111)
Creatinine, Ser: 0.48 mg/dL (ref 0.44–1.00)
GFR, Estimated: >60 mL/min (ref >60)
Glucose, Bld: 82 mg/dL (ref 70–99)
Potassium: 3.5 mmol/L (ref 3.5–5.1)
Sodium: 140 mmol/L (ref 135–145)

## 2021-06-28 LAB — MAGNESIUM: Magnesium: 2.3 mg/dL (ref 1.7–2.4)

## 2021-06-28 LAB — GLUCOSE, CAPILLARY
Glucose-Capillary: 184 mg/dL — ABNORMAL HIGH (ref 70–99)
Glucose-Capillary: 201 mg/dL — ABNORMAL HIGH (ref 70–99)
Glucose-Capillary: 211 mg/dL — ABNORMAL HIGH (ref 70–99)
Glucose-Capillary: 225 mg/dL — ABNORMAL HIGH (ref 70–99)
Glucose-Capillary: 226 mg/dL — ABNORMAL HIGH (ref 70–99)
Glucose-Capillary: 69 mg/dL — ABNORMAL LOW (ref 70–99)
Glucose-Capillary: 72 mg/dL (ref 70–99)
Glucose-Capillary: 76 mg/dL (ref 70–99)
Glucose-Capillary: 87 mg/dL (ref 70–99)

## 2021-06-28 LAB — PHOSPHORUS: Phosphorus: 3 mg/dL (ref 2.5–4.6)

## 2021-06-28 MED ORDER — AMLODIPINE BESYLATE 5 MG PO TABS
5.0000 mg | ORAL_TABLET | Freq: Every day | ORAL | Status: DC
  Administered 2021-06-28: 5 mg via ORAL

## 2021-06-28 MED ORDER — OXYCODONE HCL 5 MG PO TABS
5.0000 mg | ORAL_TABLET | Freq: Four times a day (QID) | ORAL | Status: DC | PRN
  Administered 2021-06-28 – 2021-06-30 (×8): 5 mg via ORAL
  Filled 2021-06-28 (×8): qty 1

## 2021-06-28 MED ORDER — ONDANSETRON HCL 4 MG/2ML IJ SOLN
INTRAMUSCULAR | Status: AC
  Administered 2021-06-28: 4 mg via INTRAVENOUS
  Filled 2021-06-28: qty 2

## 2021-06-28 MED ORDER — ADULT MULTIVITAMIN W/MINERALS CH
1.0000 | ORAL_TABLET | Freq: Every day | ORAL | Status: DC
  Administered 2021-06-29 – 2021-06-30 (×2): 1 via ORAL
  Filled 2021-06-28 (×2): qty 1

## 2021-06-28 MED ORDER — POTASSIUM CHLORIDE 10 MEQ/50ML IV SOLN
10.0000 meq | INTRAVENOUS | Status: AC
  Administered 2021-06-28 (×4): 10 meq via INTRAVENOUS
  Filled 2021-06-28 (×4): qty 50

## 2021-06-28 MED ORDER — LACOSAMIDE 50 MG PO TABS
100.0000 mg | ORAL_TABLET | Freq: Two times a day (BID) | ORAL | Status: DC
  Administered 2021-06-28 – 2021-06-30 (×4): 100 mg via ORAL
  Filled 2021-06-28: qty 2
  Filled 2021-06-28 (×2): qty 1
  Filled 2021-06-28: qty 2

## 2021-06-28 MED ORDER — HALOPERIDOL LACTATE 5 MG/ML IJ SOLN
5.0000 mg | Freq: Four times a day (QID) | INTRAMUSCULAR | Status: DC | PRN
  Administered 2021-06-28: 5 mg via INTRAVENOUS
  Filled 2021-06-28 (×2): qty 1

## 2021-06-28 MED ORDER — ONDANSETRON HCL 4 MG/2ML IJ SOLN
4.0000 mg | Freq: Four times a day (QID) | INTRAMUSCULAR | Status: DC | PRN
  Administered 2021-06-29 – 2021-06-30 (×2): 4 mg via INTRAVENOUS
  Filled 2021-06-28 (×2): qty 2

## 2021-06-28 MED ORDER — HALOPERIDOL LACTATE 5 MG/ML IJ SOLN
5.0000 mg | Freq: Once | INTRAMUSCULAR | Status: AC
  Administered 2021-06-28: 5 mg via INTRAVENOUS

## 2021-06-28 MED ORDER — GABAPENTIN 300 MG PO CAPS
600.0000 mg | ORAL_CAPSULE | Freq: Two times a day (BID) | ORAL | Status: DC
  Administered 2021-06-28 – 2021-06-30 (×5): 600 mg via ORAL
  Filled 2021-06-28 (×4): qty 2

## 2021-06-28 MED ORDER — ENSURE ENLIVE PO LIQD
237.0000 mL | Freq: Three times a day (TID) | ORAL | Status: DC
  Administered 2021-06-29 – 2021-06-30 (×2): 237 mL via ORAL

## 2021-06-28 MED ORDER — EZETIMIBE 10 MG PO TABS
10.0000 mg | ORAL_TABLET | Freq: Every day | ORAL | Status: DC
  Administered 2021-06-28 – 2021-06-30 (×3): 10 mg via ORAL
  Filled 2021-06-28 (×3): qty 1

## 2021-06-28 MED ORDER — INSULIN ASPART 100 UNIT/ML IJ SOLN
0.0000 [IU] | Freq: Every day | INTRAMUSCULAR | Status: DC
  Administered 2021-06-28: 2 [IU] via SUBCUTANEOUS
  Administered 2021-06-29: 4 [IU] via SUBCUTANEOUS
  Filled 2021-06-28 (×2): qty 1

## 2021-06-28 MED ORDER — INSULIN ASPART 100 UNIT/ML IJ SOLN
0.0000 [IU] | Freq: Three times a day (TID) | INTRAMUSCULAR | Status: DC
  Administered 2021-06-28: 7 [IU] via SUBCUTANEOUS
  Administered 2021-06-28 – 2021-06-29 (×2): 4 [IU] via SUBCUTANEOUS
  Administered 2021-06-29: 11 [IU] via SUBCUTANEOUS
  Administered 2021-06-29: 4 [IU] via SUBCUTANEOUS
  Administered 2021-06-30: 12:00:00 15 [IU] via SUBCUTANEOUS
  Administered 2021-06-30: 7 [IU] via SUBCUTANEOUS
  Administered 2021-06-30: 11 [IU] via SUBCUTANEOUS
  Filled 2021-06-28 (×7): qty 1

## 2021-06-28 MED ORDER — OXYCODONE-ACETAMINOPHEN 5-325 MG PO TABS
1.0000 | ORAL_TABLET | Freq: Four times a day (QID) | ORAL | Status: DC | PRN
  Administered 2021-06-28 – 2021-06-30 (×6): 1 via ORAL
  Filled 2021-06-28 (×6): qty 1

## 2021-06-28 MED ORDER — FAMOTIDINE 20 MG PO TABS
20.0000 mg | ORAL_TABLET | Freq: Two times a day (BID) | ORAL | Status: DC
  Administered 2021-06-28 – 2021-06-30 (×5): 20 mg via ORAL
  Filled 2021-06-28 (×5): qty 1

## 2021-06-28 MED ORDER — ASPIRIN EC 81 MG PO TBEC
81.0000 mg | DELAYED_RELEASE_TABLET | Freq: Every day | ORAL | Status: DC
  Administered 2021-06-28 – 2021-06-30 (×3): 81 mg via ORAL
  Filled 2021-06-28 (×2): qty 1

## 2021-06-28 MED ORDER — CLOPIDOGREL BISULFATE 75 MG PO TABS
75.0000 mg | ORAL_TABLET | Freq: Every day | ORAL | Status: DC
  Administered 2021-06-28 – 2021-06-30 (×3): 75 mg via ORAL
  Filled 2021-06-28 (×2): qty 1

## 2021-06-28 NOTE — Progress Notes (Addendum)
SLP Cancellation Note  Patient Details Name: Channell Quattrone MRN: [id number] DOB: 12-14-1960   Cancelled treatment: SLP consult received and appreciated. Chart review completed. Clinical swallowing evaluation deferred at this time given pt's nausea; pt observed gagging/wretching with emesis bag in front of pt. RN made aware. Per chart review, pt initiated a regular diet with thin liquids limited PO intake noted.   SLP to f/u for clinical swallowing evaluation, as appropriate.  RN aware of SLP POC.  Clyde Canterbury, M.S., CCC-SLP Speech-Language Pathologist [facility] [phone] (ASCOM)  Woodroe Chen 06/28/2021, 10:40 AM

## 2021-06-28 NOTE — Progress Notes (Signed)
Date of Admission:  06/19/2021     ID: Lauren Velazquez is a 60 y.o. female Principal Problem:   Acute delirium Active Problems:   Diabetes mellitus type 2, uncomplicated ([place])   Hyperlipidemia   Hypertension   Obesity, Class III, BMI 40-49.9 (morbid obesity) ([facility])   Altered mental status   Insulin dependent type 2 diabetes mellitus ([place])   Acute sepsis ([place])    Subjective: Got extubated over the weekend Pt totally awake and oriented Sitting in chair Says she was fine until the morning of the presentation- she does not remember what happened that day She never had fever, chills, nausea, voming , cough, sob, chest pain before She says sshe was on trazadone, venlafaxine, neurontin and oxycodone She stopped trazadone 2 weeks ago, cut down on the venlafaxine and reduced neurontin from 9 pills to 3 She denies illegal substance use   Medications:   amLODipine  5 mg Oral Daily   arformoterol  15 mcg Nebulization BID   aspirin EC  81 mg Oral Daily   budesonide (PULMICORT) nebulizer solution  0.25 mg Nebulization BID   chlorhexidine gluconate (MEDLINE KIT)  15 mL Mouth Rinse BID   Chlorhexidine Gluconate Cloth  6 each Topical Q0600   clopidogrel  75 mg Oral Daily   enoxaparin (LOVENOX) injection  0.5 mg/kg Subcutaneous Q24H   ezetimibe  10 mg Oral Daily   famotidine  20 mg Oral BID   feeding supplement  237 mL Oral TID BM   gabapentin  600 mg Oral BID   insulin aspart  0-20 Units Subcutaneous TID WC   insulin aspart  0-5 Units Subcutaneous QHS   lacosamide  100 mg Oral BID   multivitamin with minerals  1 tablet Oral Daily    Objective: Vital signs in last 24 hours: Patient Vitals for the past 24 hrs:  BP Temp Temp src Pulse Resp SpO2 Weight  06/28/21 1400 (!) 187/160 -- -- -- (!) 25 -- --  06/28/21 1300 (!) 189/156 -- -- (!) 138 20 94 % --  06/28/21 1200 -- 99.7 F (37.6 C) Axillary (!) 106 19 98 % --  06/28/21 1130 -- -- -- (!) 115 -- 95 % --  06/28/21 1044 -- -- -- (!)  110 -- 100 % --  06/28/21 1000 (!) 219/87 -- -- (!) 110 15 98 % --  06/28/21 0900 -- -- -- (!) 101 (!) 25 95 % --  06/28/21 0806 -- -- -- -- -- 97 % --  06/28/21 0802 -- -- -- -- -- 96 % --  06/28/21 0800 -- 99.2 F (37.3 C) Axillary (!) 110 (!) 27 95 % --  06/28/21 0439 -- -- -- -- -- -- 104.8 kg  06/28/21 0430 (!) 170/86 -- -- 63 (!) 30 97 % --  06/28/21 0400 (!) 170/76 99.1 F (37.3 C) Axillary 62 (!) 22 100 % --  06/28/21 0330 -- -- -- (!) 57 (!) 27 98 % --  06/28/21 0300 140/87 -- -- (!) 58 (!) 23 99 % --  06/28/21 0230 (!) 160/66 -- -- (!) 54 (!) 25 97 % --  06/28/21 0200 (!) 164/77 -- -- (!) 52 (!) 31 97 % --  06/28/21 0130 (!) 175/80 -- -- (!) 55 (!) 30 98 % --  06/28/21 0100 (!) 184/78 -- -- (!) 55 (!) 22 95 % --  06/28/21 0030 (!) 169/75 -- -- (!) 52 (!) 31 97 % --  06/28/21 0000 -- 99.5 F (37.5 C)  Axillary (!) 55 -- 97 % --  06/27/21 2330 (!) 166/83 -- -- (!) 56 -- 97 % --  06/27/21 2300 (!) 155/99 -- -- 66 -- 96 % --  06/27/21 2230 (!) 129/116 -- -- (!) 57 (!) 22 97 % --  06/27/21 2200 (!) 128/53 -- -- (!) 56 -- 96 % --  06/27/21 2130 137/68 -- -- (!) 55 (!) 23 97 % --  06/27/21 2100 (!) 138/57 -- -- (!) 52 (!) 32 96 % --  06/27/21 2030 -- -- -- (!) 52 (!) 37 96 % --  06/27/21 2000 -- 99.5 F (37.5 C) Axillary 60 19 94 % --  06/27/21 1800 (!) 145/72 -- -- (!) 50 (!) 31 100 % --     PHYSICAL EXAM:  General: awake and alert and oriented x 5 Head: Normocephalic, without obvious abnormality, atraumatic. Eyes: Conjunctivae clear, anicteric sclerae. Pupils are equal ENT Nares normal. No drainage or sinus tenderness. Lips, mucosa, and tongue normal. No Thrush Neck: Supple, symmetrical, no adenopathy, thyroid: non tender no carotid bruit and no JVD. Back: No CVA tenderness. Lungs: Clear to auscultation bilaterally. No Wheezing or Rhonchi. No rales. Heart: Regular rate and rhythm, no murmur, rub or gallop. Abdomen: Soft, non-tender,not distended. Bowel sounds normal. No  masses Extremities: atraumatic, no cyanosis. No edema. No clubbing Skin: No rashes or lesions. Or bruising Lymph: Cervical, supraclavicular normal. Neurologic: Grossly non-focal  Lab Results Recent Labs    06/27/21 0507 06/28/21 0455  WBC 13.3* 11.8*  HGB 8.8* 8.8*  HCT 29.5* 29.7*  NA 141 140  K 3.2* 3.5  CL 102 103  CO2 31 28  BUN 10 10  CREATININE 0.53 0.48   Liver Panel Recent Labs    06/27/21 0507  PROT 6.4*  ALBUMIN 2.9*  AST 32  ALT 18  ALKPHOS 61  BILITOT 0.3   Sedimentation Rate Recent Labs    06/26/21 1950  ESRSEDRATE 75*   C-Reactive Protein Recent Labs    06/26/21 1950  CRP 4.1*    Microbiology: [facility] 06/19/21- NG 06/20/21 NG CSF 06/20/21 NG so far CSF cell count  22 ( 9% N) Protein 45 Glucose 204 Studies/Results: No results found.   Assessment/Plan:  Patient presenting with altered mental status and bizarre behavior.  Initially she was awake and agitated and had to be intubated for lumbar puncture to protect airway. High fever after hospitalization. P.o. with altered mental status.  CSF mild pleocytosis with WBC of 22 predominantly lymphocytic.  Protein was normal.  Glucose was high. She was started on triple antibiotics and antiviral acyclovir. HSV PCR was negative and acyclovir and vancomycin were  stopped on 06/24/2021 CSF culture was negative and once the patient was extubated over the weekend ampicillin and ceftriaxone were also stopped. Infectious disease work up was neg and agree with stopping all antibiotics as clincially and by history no evidence of meningitis or encephalitis The concern was whether she had Toxidrome or seizures/post ictal/ manic episode /delirium Initially MRI suggestive of scattered punctate acute infarcts bilaterally.  MRA was normal.  2D echo was normal TEE was negative for endocarditis or thrombus.  Anemia  Diabetes mellitus on insulin  ID will sign off.  Call if needed.

## 2021-06-28 NOTE — Progress Notes (Signed)
OT Cancellation Note  Patient Details Name: Daylen Hack MRN: [id number] DOB: November 22, 1960   Cancelled Treatment:    Reason Eval/Treat Not Completed: Medical issues which prohibited therapy. Order received. Per chart review, RN recently requesting therapy to hold as pt recently fallen asleep. Pt also noted to have most recent BP outside of safe parameters for therapy evaluation at this time (BP 187/160). Will follow remotely and evaluate when medically appropriate.    Matthew Folks, OTR/L [facility] [phone]

## 2021-06-28 NOTE — Consult Note (Signed)
PHARMACY CONSULT NOTE  Pharmacy Consult for Electrolyte Monitoring and Replacement   Recent Labs: Potassium (mmol/L)  Date Value  06/28/2021 3.5  11/19/2014 3.8   Magnesium (mg/dL)  Date Value  38/45/3646 2.3   Calcium (mg/dL)  Date Value  80/32/1224 8.6 (L)   Calcium, Total (mg/dL)  Date Value  82/50/0370 9.4   Albumin (g/dL)  Date Value  48/88/9169 2.9 (L)  11/19/2014 3.9   Phosphorus (mg/dL)  Date Value  45/10/8880 3.0   Sodium (mmol/L)  Date Value  06/28/2021 140  11/19/2014 139   Assessment: Patient is a 60 y/o F with medical history including diabetes, HTN, vertigo, HLD, anxiety, chronic back pain who presented to the ED 11/5 with altered mentation. Patient subsequently found to have elevated lactic acid and leukocytosis without obvious source. There is concern for meningitis and lumbar puncture was performed and patient started on broad spectrum antimicrobial coverage. Patient ultimately required intubation for airway protection due to worsening agitation. Patient is currently admitted to the ICU. Pharmacy consulted to assist with electrolyte monitoring and replacement as indicated.  Patient extubated 11/12  Diet: NPO. Appears no enteral access at this time MIVF: D5LR at 50 mL/hr  Goal of Therapy:  Electrolytes within normal limits  Plan:  --K 3.5, IV Kcl 10 mEq x 4 --Will consider adding potassium to MIVF depending on nutrition plan --Follow-up electrolytes with morning labs  Tressie Ellis 06/28/2021 7:30 AM

## 2021-06-28 NOTE — Progress Notes (Signed)
Patient has been combative, irritable, non-compliant with safety protocols. Patient has had repeated request for her "pain medicine" all night, despite getting Morphine q4H as ordered PRN. Approx. 5615, patient started to hallucinate saying that there were roaches crawling on the wall and that her husband was outside. NP at bedside. Morphine given as ordered PRN. Patient O2 sats dropped to upper 80s, increased to 4L then weaned back down to 2L. Patient now attempting to climb out the bed. Several incontinent episodes of diarrhea. BP elevated, Hydralazine given x 1. One episode of vomiting, treated with Zofran. Blood sugars low, D5LR infusing. Sugar 72 this AM. NP aware. ST eval placed as patient is still NPO. Report given to [facility] ([facility]), Charity fundraiser. Patient care transferred.

## 2021-06-28 NOTE — Progress Notes (Signed)
NAME:  Lauren Velazquez, MRN:  [id number], DOB:  29-Aug-1960, LOS: 61 ADMISSION DATE:  06/19/2021, INITIAL CONSULTATION DATE:  06/19/2021 REFERRING MD:  Dr. Leonides Schanz Chrisitine, CHIEF COMPLAINT:  Altered Mental Status   Brief Patient Description  60 year old female presenting to [facility] ED from home via EMS for multiple complaints including stroke, chest pain, shortness of breath and pain all over. (Per ED documentation)   ED course: Per ED documentation upon arrival patient was agitated and extremely altered, exhibiting bizarre behavior.  She was screaming and yelling sticking her finger down her throat and attempt to make her self vomit.  While sitting in a recliner began attempting to flip it over eventually requiring physical restraint for her own safety.  ED staff attempted to sedate her as she was kicking and swinging arms at staff and husband.  Her main complaint to the ED provider was chest pain, she stated she was worried that she might have had a heart attack as well as feeling very dehydrated.  She also insisted on being given her chronic pain medication, however she also told staff she will take all her pills and overdose. Patient was placed under IVC.  Per ED documentation, husband denied recreational drug use or any new medications (unclear if this comment was made to EDP or EMS).  While in the ED patient became febrile and sepsis protocol was initiated.  No definitive source of infection noted in CXR/UA, this in combination with severely altered mental status raise concern for meningitis.  Due to patient's increasingly agitated state refractory to benzodiazepines the decision was made by the EDP to mechanically intubate for airway protection in order to provide further sedation so that an LP could be performed safely  Medications given: 50 mg Benadryl, 5 mg Haldol, 2 mg Ativan x2, cefepime/vancomycin/acyclovir, 10 mg of Decadron, total 2.6 L IV fluid bolus, succinylcholine for RSI, 650 mg acetaminophen  for fever Initial Vitals: Severely febrile at 103.9, tachypneic 32, tachycardic 134, hypertensive urgency 232/97 & 95% on room air EKG Interpretation: Date: 06/19/2021, EKG Time: 23:23, Rate: 121, Rhythm: Sinus tachycardia, QRS Axis: RAD, Intervals: Prolonged QTC, ST/T Wave abnormalities: None, Narrative Interpretation: Sinus tachycardia with RAD Chemistry: Na+: 134, K+: 3.9, BUN/Cr.:  9/0.76, Serum CO2/ AG: 28/9 Hematology: WBC: 14.4, Hgb: 12.2,  Normal troponin: 8> 15,  Lactic/ PCT: 4.5/pending, COVID-19 & Influenza A/B: negative ABG: 7.46/32/177/22.8 CXR 06/20/21: mild central vascular congestion CT head wo contrast 06/20/21: no acute intracranial abnormality   PCCM consulted for admission due to need for emergent intubation for airway protection and mechanical ventilatory support.  Pertinent  Medical History  T2DM HTN Vertigo HLD Anxiety Chronic Back pain COVID-19   Significant Hospital Events: Including procedures, antibiotic start and stop dates in addition to other pertinent events   06/20/21- Admit to ICU  with suspected meningitis s/p mechanical intubation for airway protection in the setting of increased sedation needs and agitation. S/p LP @2 :37 AM. ID consult. Patient started empirically on vancomycin, ceftriaxone, ampicillin and acyclovir.  ID consult. 06/21/21- MRI w/out contrast shows acute infarcts (embolic vs. Hypotensive). Neuro, and psych consulted.  TTE showed normal LVEF, grade I diastolic dysfunction, no sig valvular abnormalities and no intracardiac clot.  rEEG showed moderate diffuse slowing with subtle superimposed right focal slowing without epileptiform abnormalities 06/22/21-persistent agitation off of sedation.  Started Depakene for mood stabilization and cyproheptadine for potential serotonergic symptoms. 06/23/21: Remains on the vent and sedated. 06/24/21: Remains on the vent. Neuro eval recs central embolic suspected.  Start  ASA 64m daily + plavix 754mdaily x21  days f/b ASA 8159maily after that.  Consider TEE 06/25/21: Remains on the vent and sedation.  Status post TEE negative for thrombus or vegetation. 06/26/21: S/p extubation now IVC with sitter at the bedside 06/27/21: Stable on [place] @2L  with fluctuating mental status. Sitter at bedside 06/28/21: Restart home dose of oxycodone for concern of possible withdrawal. EEG per neuro r/o seizure activity. Patient stable, able to be transferred to step down care.   Cultures:  11/6>> CSF- mild pleocytosis. Culture- no growth at 4 days. VDRL- negative.  11/6>> blood cultures- negative (no growth @4d ) 11/6>> MRSA PCR - negative  11/6>> HSV1/2- negative  11/6>> Crypotococcal Ag - negative   Antimicrobials:  11/6 Vancomycin >>  11/6 Ampicillin >> 11/6 Ceftriaxone >> 11/6 Acyclovir >> 11/6 Cefepime >> x 1 dose  Interim History / Subjective:  Patient rolling around in bed, restlessly and occasionally yelling out. Appears disoriented, but upon question is A&Ox4, able to provide some history and ask for home dose of pain medication. Restarted home dose of oxycodone for possible withdrawal. Increasing alertness and orientation throughout the day.   OBJECTIVE   Blood pressure (!) 219/87, pulse (!) 106, temperature 99.7 F (37.6 C), temperature source Axillary, resp. rate 19, height 5' 1"  (1.549 m), weight 104.8 kg, last menstrual period 08/15/2008, SpO2 98 %.        Intake/Output Summary (Last 24 hours) at 06/28/2021 1242 Last data filed at 06/28/2021 1150 Gross per 24 hour  Intake 1733.49 ml  Output 4650 ml  Net -2916.51 ml   Filed Weights   06/26/21 0449 06/27/21 0500 06/28/21 0439  Weight: 108.9 kg 109 kg 104.8 kg   Physical Examination: GENERAL: [age]-year-old patient lying in bed, rolling around, restless and occassionally yelling out.  EYES: Pupils equal, round, reactive to light and accommodation. No scleral icterus. Tracks appropriately.  HEENT: Head atraumatic, normocephalic.  Oropharynx and nasopharynx clear.  LUNGS: Extubated on 11/12. Normal breath sounds bilaterally, no wheezing, rales,rhonchi or crepitation. No use of accessory muscles of respiration.  CARDIOVASCULAR: S1, S2 normal. No murmurs, rubs, or gallops.  ABDOMEN: Soft, nontender, nondistended. Bowel sounds present. No organomegaly or mass.  EXTREMITIES: Bilateral lower extremity none dependent edema, cyanosis, or clubbing.  NEUROLOGIC: Patient is awake, alert, oriented to person, place, and day of the week with appropriate speech upon questioning Moves all extremities spontaneously. Follows commands and is able to provide some sensible medical history.  SKIN: Healing wounds on R lower leg.   Labs/imaging that I havepersonally reviewed  (right click and "Reselect all SmartList Selections" daily)  CXR 06/20/21: mild central vascular congestion CT head wo contrast 06/20/21: no acute intracranial abnormality CT abd/pelvis 06/20/21: No evidence of inflammatory process or abscess. Evidence of cholelithiasis, bilateral nephrolithiasis, 5.4 cm benign-appearing left adnexal mass.   CSF 06/20/21: mild pleocytosis MR brain w/out 06/21/21: Scattered punctate acute infarcts in both cerebral hemispheres (bilateral PCA and left MCA territories). No associated hemorrhage or mass effect. Although the posterior fossa and anterior cerebral artery territories are spared, the pattern is suspicious for a recent embolic event. Hypotensive episode might also produce this appearance. 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  Echo 06/21/21: EF 60-65%, otherwise normal.  EEG 06/21/21: moderate diffuse slowing, focal right sided slowing, no epileptiform abnormalities.  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  Wide patency of the left carotid artery system within the neck. Widely  patent vertebral arteries within the neck. TEE 06/25/2021: negative for thrombus or vegetation   Labs   CBC: Recent Labs  Lab 06/22/21 0448 06/24/21 1122 06/26/21 0403 06/27/21 0507 06/28/21 0455  WBC 12.3* 8.1 7.7 13.3* 11.8*  HGB 9.4* 8.6* 8.8* 8.8* 8.8*  HCT 31.0* 29.1* 29.7* 29.5* 29.7*  MCV 80.7 84.1 84.1 81.0 82.3  PLT 383 328 335 372 797    Basic Metabolic Panel: Recent Labs  Lab 06/22/21 0448 06/23/21 0509 06/24/21 0447 06/25/21 0425 06/26/21 0403 06/27/21 0507 06/28/21 0455  NA 141 142 145 143 144 141 140  K 3.1* 3.5 3.5 3.4* 4.3 3.2* 3.5  CL 104 106 108 108 108 102 103  CO2 30 30 31 31 30 31 28   GLUCOSE 182* 145* 172* 143* 193* 98 82  BUN <5* 8 9 10 12 10 10   CREATININE 0.43* 0.45 0.45 0.35* 0.43* 0.53 0.48  CALCIUM 8.3* 8.1* 8.2* 8.2* 8.6* 8.6* 8.6*  MG 2.0  --   --   --  2.4 2.1 2.3  PHOS 2.5 3.4  --   --  3.0 2.8 3.0   GFR: Estimated Creatinine Clearance: 85.4 mL/min (by C-G formula based on SCr of 0.48 mg/dL). Recent Labs  Lab 06/24/21 1122 06/26/21 0403 06/27/21 0507 06/28/21 0455  WBC 8.1 7.7 13.3* 11.8*    Liver Function Tests: Recent Labs  Lab 06/23/21 0509 06/27/21 0507  AST  --  32  ALT  --  18  ALKPHOS  --  61  BILITOT  --  0.3  PROT  --  6.4*  ALBUMIN 2.5* 2.9*   No results for input(s): LIPASE, AMYLASE in the last 168 hours. No results for input(s): AMMONIA in the last 168 hours.   ABG    Component Value Date/Time   PHART 7.46 (H) 06/20/2021 0204   PCO2ART 32 06/20/2021 0204   PO2ART 177 (H) 06/20/2021 0204   HCO3 40.1 (H) 06/26/2021 1950   ACIDBASEDEF 0.5 06/20/2021 0204   O2SAT 76.9 06/26/2021 1950     Coagulation Profile: No results for input(s): INR, PROTIME in the last 168 hours.   Cardiac Enzymes: No results for input(s): CKTOTAL, CKMB, CKMBINDEX, TROPONINI in the last 168 hours.  HbA1C: Hgb A1c MFr Bld  Date/Time Value Ref Range Status  06/20/2021 05:00 AM 8.2 (H) 4.8 - 5.6 % Final    Comment:     (NOTE) Pre diabetes:          5.7%-6.4%  Diabetes:              >6.4%  Glycemic control for   <7.0% adults with diabetes   03/06/2020 07:05 AM 9.1 (H) 4.8 - 5.6 % Final    Comment:    (NOTE)         Prediabetes: 5.7 - 6.4         Diabetes: >6.4         Glycemic control for adults with diabetes: <7.0     CBG: Recent Labs  Lab 06/28/21 0334 06/28/21 0402 06/28/21 0625 06/28/21 0723 06/28/21 1119  GLUCAP 69* 76 72 87 201*    Allergies Allergies  Allergen Reactions   Atorvastatin Swelling     Home Medications  Prior to Admission medications   Medication Sig Start Date End Date Taking? Authorizing Provider  diclofenac sodium (VOLTAREN) 1 % GEL Apply 2 g topically 2 (two) times daily as needed (pain).  Yes [provider]  ferrous gluconate (FERGON) 324 MG tablet Take 324 mg by mouth 3 (three) times daily. 04/02/21  Yes [provider]  Fluticasone-Umeclidin-Vilant 100-62.5-25 MCG/ACT AEPB Inhale 1 puff into the lungs daily. 05/18/21  Yes [provider]  gabapentin (NEURONTIN) 300 MG capsule Take 600 mg by mouth 2 (two) times daily.    Yes [provider]  insulin aspart (NOVOLOG) 100 UNIT/ML injection Inject 0-20 Units into the skin 3 (three) times daily with meals. (based upon blood sugar reading)   Yes [provider]  LANTUS SOLOSTAR 100 UNIT/ML Solostar Pen Inject 40 Units into the skin 2 (two) times daily. This is decreased from prior dose of 70 units twice daily due to low blood glucose. 07/22/20  Yes Enzo Bi, MD  lisinopril (ZESTRIL) 40 MG tablet Take 40 mg by mouth daily.    Yes [provider]  oxyCODONE-acetaminophen (PERCOCET) 10-325 MG tablet Take 1 tablet by mouth every 6 (six) hours as needed for pain.  04/12/17  Yes [provider]  traZODone (DESYREL) 100 MG tablet Take 200 mg by mouth at bedtime as needed for sleep.   Yes [provider]  venlafaxine XR (EFFEXOR-XR) 75 MG 24 hr capsule  Take 75-150 mg by mouth 2 (two) times daily.    Yes [provider]  VENTOLIN HFA 108 (90 Base) MCG/ACT inhaler Inhale 2 puffs into the lungs every 6 (six) hours as needed for wheezing or shortness of breath.    Yes [provider]  Scheduled Meds:  amLODipine  5 mg Oral Daily   arformoterol  15 mcg Nebulization BID   aspirin EC  81 mg Oral Daily   budesonide (PULMICORT) nebulizer solution  0.25 mg Nebulization BID   chlorhexidine gluconate (MEDLINE KIT)  15 mL Mouth Rinse BID   Chlorhexidine Gluconate Cloth  6 each Topical Q0600   clopidogrel  75 mg Oral Daily   enoxaparin (LOVENOX) injection  0.5 mg/kg Subcutaneous Q24H   ezetimibe  10 mg Oral Daily   famotidine  20 mg Oral BID   gabapentin  600 mg Oral BID   insulin aspart  0-20 Units Subcutaneous TID WC   insulin aspart  0-5 Units Subcutaneous QHS   lacosamide  100 mg Oral BID   multivitamin with minerals  1 tablet Oral Daily   Continuous Infusions:  sodium chloride Stopped (06/27/21 1938)   dextrose 5% lactated ringers 50 mL/hr at 06/28/21 0600   lacosamide (VIMPAT) IV Stopped (06/27/21 2305)   potassium chloride 10 mEq (06/28/21 1150)   PRN Meds:.sodium chloride, albuterol, diclofenac Sodium, hydrALAZINE, ibuprofen, ondansetron (ZOFRAN) IV, oxyCODONE **AND** oxyCODONE-acetaminophen, polyethylene glycol  Resolved Hospital Problem list     ASSESSMENT & PLAN  60 yo woman admitted with acute encephalopathy in the setting of suspected viral meningitis vs. serotonin syndrome vs. 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   Acute Hypoxic Respiratory Failure secondary to sedation requirements in the setting of severe agitation and encephalopathy- (resolving) -S/p extubation 11/12 -Supplemental O2 to maintain SpO2 > 90%, on 2 L [place] -Intermittent chest x-ray & ABG PRN -Ensure adequate pulmonary hygiene   -Budesonide inhaler/nebs BID, bronchodilators PRN  Sepsis without septic shock due to suspected meningitis versus viral illness (resolved) Febrile illness? viral illness versus serotonin syndrome: Resolved Lactic: 4.5-->3.9>> 1.4, Baseline PCT: <0.10, UA: +ketones +protein, CXR: mild central vascular congestion, CTH: negative, LP: mild pleocytosis  -  Initial interventions/workup included: 1.6 L of LR & 1 L NS & Cefepime/ Vancomycin/ acyclovir -Supplemental oxygen as needed, to maintain SpO2 > 90% -f/u cultures, trend lactic/ PCT, f/u CK level -Daily CBC, monitor WBC/ fever curve -IV antibiotics for meningitis coverage: ceftriaxone, vancomycin, ampicillin & acyclovir (all discontinued)  -IVF hydration as needed: LR @ 75 mL/h -Strict I/O's: alert provider if UOP < 0.5 mL/kg/hr  Seizure Like Activity - Patient no longer having seizure-like activity, patient is alert and oriented. Per neuro no need for cEEG, will DC at this time. Continue Vimpat BID until outpatient follow up.  -Loaded with IV Vimpat 200 mm x 1 on 11/12 -Seizure precautions -Ativan/Versed prn seizure activity -Neurology input appreciated.  Acute Encephalopathy secondary to suspected meningitis R/O Serotonin syndrome, manic episode PMHx: Anxiety, chronic pain (w/ opioid dependence)  -supportive care -Per ID- CSF shows mild pleocytosis (lymphatic), unlikely bacterial (continue abx & acyclovir for now), discontinue antibiotics if CSF studies negative. -neurology consult- following for stroke and seizure-like activity  -continue outpatient: gabapentin, restarted outpatient oxycodone for chronic pain. Discontinued trazodone (query serotonin syndrome). -Continue to hold Effexor for now continue restarting pending formal psych eval -Continue Depakene for mood stabilization -STOP: Trazodone, Zofran (serotonin syndrome potential) particularly with concomitant use of fentanyl -Appreciate input from psychiatry (cannot rule out manic  episode) -Currently IVC, maintain sitter at the bedside    Acute bilateral punctate infarcts (embolic vs. hypotensive episode)   Etiology: embolic vs. hypotensive (80s-90s/50s-60s) & bradycardic episode overnight (0000-0100) may have lead to hypotensive findings on MRI (MRI at 0357). Normal echo, no evidence of AFIB on EKG/continuous monitor. Per neuro, likely embolic in etiology.  -Continue  ASA 61m daily + plavix 750mdaily x21 days f/b ASA 8133maily after that -MRAshowed <50% carotid stenosis bilat with no other hemodynamically significant stenoses  -TEE negative for thrombus or vegetation, per neuro will need 30 day outpatient loop recorder -statin per guidelines  -q4 hr neuro checks -STAT head CT for any change in neuro exam -PT/OT/SLP when patient able to participate   Hypertension Urgency (improved) PMHx: HLD, HTN -hydralazine PRN Q 4 -on Norvasc 5 mg  -continuous cardiac monitoring -hold outpatient lisinopril, consider restarting as patient stabilizes   Poorly controlled Type 2 Diabetes Mellitus -Hemoglobin A1C: 8.2 -Monitor CBG Q 4 hours -SSI moderate dosing -continue outpatient Lantus dosing: 40 units BID -target range while in ICU: 140-180 -follow ICU hyper/hypo-glycemia protocol  Best practice (right click and "Reselect all SmartList Selections" daily)  Diet:  NPO Pain/Anxiety/Delirium protocol (if indicated): Yes (RASS goal -1) VAP protocol (if indicated): Not indicated DVT prophylaxis: LMWH GI prophylaxis: H2B Glucose control:  SSI Yes Central venous access:  Yes, and it is still needed Arterial line:  NO Foley:  Yes, and it is still needed Mobility:  bed rest  PT consulted: Yes Last date of multidisciplinary goals of care discussion [06/26/21] Code Status:  full code Disposition: Step Down   SamVic Blackbird-S [place] MPAS

## 2021-06-28 NOTE — Progress Notes (Signed)
PHARMACIST - PHYSICIAN COMMUNICATION  CONCERNING: IV to Oral Route Change Policy  RECOMMENDATION: This patient is receiving famotidine by the intravenous route.  Based on criteria approved by the Pharmacy and Therapeutics Committee, the intravenous medication(s) is/are being converted to the equivalent oral dose form(s).   DESCRIPTION: These criteria include: The patient is eating (either orally or via tube) and/or has been taking other orally administered medications for a least 24 hours The patient has no evidence of active gastrointestinal bleeding or impaired GI absorption (gastrectomy, short bowel, patient on TNA or NPO).  If you have questions about this conversion, please contact the Pharmacy Department   Tressie Ellis, [facility] 06/28/2021 8:50 AM

## 2021-06-28 NOTE — Progress Notes (Signed)
Attending:    Subjective: 60 y/o female admitted on 11/5 with confusion, fever. Had a pleocytosis on lumbar puncture obtained after intubation for airway protection and combativeness.  Concern on admission for serotonin syndrome vs benzodiazepine/narcotic related toxic encephalopathy. Extubated on 11/12, has been somewhat agitated since. Had a period of being obtunded after extubation, thought possibly post-ictal.  Received haldol this morning, periodic morphine.  Requesting her home dose of oxycodone for knee and back pain. Complains of inability to breathe this morning, says she has COPD but is a never smoker.  There is also concern for multi-focal acute strokes possibly embolic from a central source (from MRI brain 11/7), TEE negative for valvular disease or clot.     Objective: Vitals:   06/28/21 0430 06/28/21 0439 06/28/21 0802 06/28/21 0806  BP: (!) 170/86     Pulse: 63     Resp: (!) 30     Temp:      TempSrc:      SpO2: 97%  96% 97%  Weight:  104.8 kg    Height:          Intake/Output Summary (Last 24 hours) at 06/28/2021 0910 Last data filed at 06/28/2021 0600 Gross per 24 hour  Intake 1353.49 ml  Output 1475 ml  Net -121.51 ml    General:  Moaning in bed, lying flat HENT: NCAT OP clear PULM: CTA B, normal effort CV: RRR, no mgr GI: BS+, soft, nontender MSK: normal bulk and tone Neuro: on conversation she is awake and alert, oriented to situation and day of the week, moves all four extremities, no asterixis, follows commands.  Somewhat inattentive but able to converse.      CBC    Component Value Date/Time   WBC 11.8 (H) 06/28/2021 0455   RBC 3.61 (L) 06/28/2021 0455   HGB 8.8 (L) 06/28/2021 0455   HGB 10.1 (L) 11/19/2014 2136   HCT 29.7 (L) 06/28/2021 0455   HCT 33.3 (L) 11/19/2014 2136   PLT 399 06/28/2021 0455   PLT 506 (H) 11/19/2014 2136   MCV 82.3 06/28/2021 0455   MCV 74 (L) 11/19/2014 2136   MCH 24.4 (L) 06/28/2021 0455   MCHC 29.6 (L) 06/28/2021  0455   RDW 16.0 (H) 06/28/2021 0455   RDW 16.8 (H) 11/19/2014 2136   LYMPHSABS 1.1 06/19/2021 1722   LYMPHSABS 1.5 11/19/2014 2136   MONOABS 0.4 06/19/2021 1722   MONOABS 0.5 11/19/2014 2136   EOSABS 0.0 06/19/2021 1722   EOSABS 0.0 11/19/2014 2136   BASOSABS 0.1 06/19/2021 1722   BASOSABS 0.0 11/19/2014 2136    BMET    Component Value Date/Time   NA 140 06/28/2021 0455   NA 139 11/19/2014 2136   K 3.5 06/28/2021 0455   K 3.8 11/19/2014 2136   CL 103 06/28/2021 0455   CL 104 11/19/2014 2136   CO2 28 06/28/2021 0455   CO2 27 11/19/2014 2136   GLUCOSE 82 06/28/2021 0455   GLUCOSE 165 (H) 11/19/2014 2136   BUN 10 06/28/2021 0455   BUN 9 11/19/2014 2136   CREATININE 0.48 06/28/2021 0455   CREATININE 0.59 11/19/2014 2136   CALCIUM 8.6 (L) 06/28/2021 0455   CALCIUM 9.4 11/19/2014 2136   GFRNONAA >60 06/28/2021 0455   GFRNONAA >60 11/19/2014 2136   GFRAA >60 03/10/2020 0522   GFRAA >60 11/19/2014 2136    CXR images none today  Impression/Plan: Acute toxic encephalopathy without a clear etiology. Favor polypharmacy, but MRI Brain picture worrisome for acute scattered  embolic strokes without a clear cause.   > restart home oxycodone > hold prn morphine > out of bed > advance diet > PT consult, SLP consult > minimize sedatives > frequent orientation > plavix, statin, asa  Wheezing> sounds upper airway but has history of COPD > brovana/pulmicort > albuterol prn  Question seizure activiey on 11/12 > vimpat to continue > EEG today  DM2 > SSI  My cc time 30 minutes  Heber [place], MD [facility] Pager: [phone] Cell: [phone] After 7pm: [phone]

## 2021-06-28 NOTE — Progress Notes (Signed)
Nutrition Follow Up Note   DOCUMENTATION CODES:   Obesity unspecified  INTERVENTION:   Ensure Enlive po TID, each supplement provides 350 kcal and 20 grams of protein  MVI po daily   NUTRITION DIAGNOSIS:   Inadequate oral intake related to inability to eat (pt sedated and ventilated) as evidenced by NPO status.  GOAL:   Patient will meet greater than or equal to 90% of their needs -met with tube feeds   MONITOR:   PO intake, Supplement acceptance, Labs, Skin, I & O's, Weight trends  ASSESSMENT:   60 y/o female with h/o DM, HTN, HLD, anxiety and COVID 19 who is admitted with sepsis and suspected meningitis.  Pt extubated 11/12. Pt moving around in bed and yelling out at time of RD visit. Pt requesting to be started on her home pain medications. Pt initiated on a regular diet today. Pt documented to have eaten 5% of her breakfast this morning. RD will add supplements to help pt meet her estimated needs. Per chart, pt has remained fairly weight stable since admission. Psychiatry following; pt currently admitted IVC.   Medications reviewed and include: aspirin, plavix, lovenox, pepcid, insulin, MVI, LRS w/ 5% dextrose @50ml /hr  Labs reviewed: K 3.5 wnl, P 3.0 wnl, Mg 2.3 wnl Wbc- 11.8(H), Hgb 8.8(L), Hct 29.7(L)  Diet Order:   Diet Order             Diet regular Room service appropriate? Yes; Fluid consistency: Thin  Diet effective now                  EDUCATION NEEDS:   No education needs have been identified at this time  Skin:  Skin Assessment: Reviewed RN Assessment  Last BM:  11/14- TYPE 7  Height:   Ht Readings from Last 1 Encounters:  06/20/21 5' 1"  (1.549 m)    Weight:   Wt Readings from Last 1 Encounters:  06/28/21 104.8 kg    Ideal Body Weight:  47.7 kg  BMI:  Body mass index is 43.65 kg/m.  Estimated Nutritional Needs:   Kcal:  1900-2200kcal/day  Protein:  95-110g/day  Fluid:  1.4-1.7L/day  Koleen Distance MS, RD, LDN Please  refer to [facility] for RD and/or RD on-call/weekend/after hours pager

## 2021-06-28 NOTE — Progress Notes (Signed)
PT Cancellation Note  Patient Details Name: Lauren Velazquez MRN: [id number] DOB: [birth year]   Cancelled Treatment:    Reason Eval/Treat Not Completed: Other (comment). Per RN, pt just recently fallen asleep, requesting PT to re-attempt later as able.   Olga Coaster PT, DPT 2:29 PM,06/28/21

## 2021-06-28 NOTE — Consult Note (Signed)
[facility] Consult   Reason for Consult: Follow-up consult 60 year old woman who presented to the emergency room with acute agitated delirium and has been in the intensive care unit ever since Referring Physician: Mcquaid Patient Identification: Lauren Velazquez MRN:  [id number] Principal Diagnosis: Acute delirium Diagnosis:  Principal Problem:   Acute delirium Active Problems:   Diabetes mellitus type 2, uncomplicated ([place])   Hyperlipidemia   Hypertension   Obesity, Class III, BMI 40-49.9 (morbid obesity) ([place])   Altered mental status   Insulin dependent type 2 diabetes mellitus ([place])   Acute sepsis ([place])   Total Time spent with patient: 1 hour  Subjective:   Lauren Velazquez is a 60 y.o. female patient admitted with "I do not remember it at all".  HPI: Patient seen chart reviewed.  See previous note.  60 year old woman with no severe past psychiatric history presented to the emergency room with acute agitation and confusion requiring sedation.  Resulted in several days on a ventilator before she could safely be taken off.  Has now been off the ventilator for a day or 2.  Patient sitting up in the bedside chair.  Wide awake.  Oriented to situation.  Pleasant and cooperative.  Says she has no memory of coming to the emergency room and no memory of that entire day.  Does not remember having been sick or unwell recently.  Patient says she had been taking all of her pain medication exactly as prescribed.  She does mention that she had recently stopped taking her trazodone for a couple weeks and also had been cutting down on her venlafaxine dose.  Denies any other substance abuse.  Currently has no complaints.  Mood is fine.  Not having any hallucinations.  Not confused.  Very appropriate in her conversation.  Past Psychiatric History: Past history of anxiety and depression mild managed by regular primary provider no previous hospitalizations.  No previous documented substance abuse  problems  Risk to Self:   Risk to Others:   Prior Inpatient Therapy:   Prior Outpatient Therapy:    Past Medical History:  Past Medical History:  Diagnosis Date   Anemia    not currently under treatment   Anxiety    Asthma    during allergy season   Diabetes mellitus without complication ([place])    Hypertension    Vertigo     Past Surgical History:  Procedure Laterality Date   CARPAL TUNNEL RELEASE Right 2015   KNEE ARTHROSCOPY Left 05/17/2018   Procedure: ARTHROSCOPY KNEE WITH LATERAL RELEASE, PARTIAL SYNOVECTOMY;  Surgeon: Hessie Knows, MD;  Location: [facility] ORS;  Service: Orthopedics;  Laterality: Left;   KNEE ARTHROSCOPY WITH LATERAL RELEASE Right 12/28/2017   Procedure: KNEE ARTHROSCOPY WITH LATERAL RELEASE;  Surgeon: Hessie Knows, MD;  Location: [facility] ORS;  Service: Orthopedics;  Laterality: Right;   TIBIAL TUBERCLERPLASTY Bilateral 06/01/2017   Procedure: TIBIAL TUBERCLE SPUR EXCISION;  Surgeon: Hessie Knows, MD;  Location: [facility] ORS;  Service: Orthopedics;  Laterality: Bilateral;   Family History: History reviewed. No pertinent family history. Family Psychiatric  History: See previous Social History:  Social History   Substance and Sexual Activity  Alcohol Use No     Social History   Substance and Sexual Activity  Drug Use No    Social History   Socioeconomic History   Marital status: Married    Spouse name: Not on file   Number of children: Not on file   Years of education: Not on file   Highest  education level: Not on file  Occupational History   Not on file  Tobacco Use   Smoking status: Never   Smokeless tobacco: Never  Vaping Use   Vaping Use: Never used  Substance and Sexual Activity   Alcohol use: No   Drug use: No   Sexual activity: Not on file  Other Topics Concern   Not on file  Social History Narrative   Not on file   Social Determinants of Health   Financial Resource Strain: Not on file  Food Insecurity: Not on file  Transportation  Needs: Not on file  Physical Activity: Not on file  Stress: Not on file  Social Connections: Not on file   Additional Social History:    Allergies:   Allergies  Allergen Reactions   Atorvastatin Swelling    Labs:  Results for orders placed or performed during the hospital encounter of 06/19/21 (from the past 48 hour(s))  Glucose, capillary     Status: None   Collection Time: 06/26/21  7:16 PM  Result Value Ref Range   Glucose-Capillary 97 70 - 99 mg/dL    Comment: Glucose reference range applies only to samples taken after fasting for at least 8 hours.  RPR     Status: None   Collection Time: 06/26/21  7:50 PM  Result Value Ref Range   RPR Ser Ql NON REACTIVE NON REACTIVE    Comment: Performed at [facility], [address] , [place] [address]  Sedimentation rate     Status: Abnormal   Collection Time: 06/26/21  7:50 PM  Result Value Ref Range   Sed Rate 75 (H) 0 - 30 mm/hr    Comment: Performed at [facility], [address] [place], [place] [address]  C-reactive protein     Status: Abnormal   Collection Time: 06/26/21  7:50 PM  Result Value Ref Range   CRP 4.1 (H) <1.0 mg/dL    Comment: Performed at [facility], [place] [address] [place], [place] [address]  Blood gas, venous     Status: Abnormal   Collection Time: 06/26/21  7:50 PM  Result Value Ref Range   FIO2 0.35    Delivery systems BILEVEL POSITIVE AIRWAY PRESSURE     Comment:  14/8   LHR 10 resp/min   pH, Ven 7.53 (H) 7.250 - 7.430   pCO2, Ven 48 44.0 - 60.0 mmHg   pO2, Ven 36.0 32.0 - 45.0 mmHg   Bicarbonate 40.1 (H) 20.0 - 28.0 mmol/L   Acid-Base Excess 15.7 (H) 0.0 - 2.0 mmol/L   O2 Saturation 76.9 %   Patient temperature 37.0    Collection site VEIN    Sample type VENOUS    Mechanical Rate 10     Comment: Performed at [facility], [address] [place], [place] [address]  Glucose, capillary     Status: None   Collection Time: 06/26/21 11:01 PM  Result  Value Ref Range   Glucose-Capillary 96 70 - 99 mg/dL    Comment: Glucose reference range applies only to samples taken after fasting for at least 8 hours.  Glucose, capillary     Status: Abnormal   Collection Time: 06/27/21  3:32 AM  Result Value Ref Range   Glucose-Capillary 54 (L) 70 - 99 mg/dL    Comment: Glucose reference range applies only to samples taken after fasting for at least 8 hours.  Glucose, capillary     Status: Abnormal  Collection Time: 06/27/21  3:54 AM  Result Value Ref Range   Glucose-Capillary 157 (H) 70 - 99 mg/dL    Comment: Glucose reference range applies only to samples taken after fasting for at least 8 hours.  Comprehensive metabolic panel     Status: Abnormal   Collection Time: 06/27/21  5:07 AM  Result Value Ref Range   Sodium 141 135 - 145 mmol/L   Potassium 3.2 (L) 3.5 - 5.1 mmol/L   Chloride 102 98 - 111 mmol/L   CO2 31 22 - 32 mmol/L   Glucose, Bld 98 70 - 99 mg/dL    Comment: Glucose reference range applies only to samples taken after fasting for at least 8 hours.   BUN 10 6 - 20 mg/dL   Creatinine, Ser 0.53 0.44 - 1.00 mg/dL   Calcium 8.6 (L) 8.9 - 10.3 mg/dL   Total Protein 6.4 (L) 6.5 - 8.1 g/dL   Albumin 2.9 (L) 3.5 - 5.0 g/dL   AST 32 15 - 41 U/L   ALT 18 0 - 44 U/L   Alkaline Phosphatase 61 38 - 126 U/L   Total Bilirubin 0.3 0.3 - 1.2 mg/dL   GFR, Estimated >60 >60 mL/min    Comment: (NOTE) Calculated using the CKD-EPI Creatinine Equation (2021)    Anion gap 8 5 - 15    Comment: Performed at [facility], [address] [place], [place] [address]  Magnesium     Status: None   Collection Time: 06/27/21  5:07 AM  Result Value Ref Range   Magnesium 2.1 1.7 - 2.4 mg/dL    Comment: Performed at [facility], [address] [place], [place] [address]  Phosphorus     Status: None   Collection Time: 06/27/21  5:07 AM  Result Value Ref Range   Phosphorus 2.8 2.5 - 4.6 mg/dL    Comment: Performed at [facility], [address] [place], [place] [address]  CBC     Status: Abnormal   Collection Time: 06/27/21  5:07 AM  Result Value Ref Range   WBC 13.3 (H) 4.0 - 10.5 K/uL   RBC 3.64 (L) 3.87 - 5.11 MIL/uL   Hemoglobin 8.8 (L) 12.0 - 15.0 g/dL   HCT 29.5 (L) 36.0 - 46.0 %   MCV 81.0 80.0 - 100.0 fL   MCH 24.2 (L) 26.0 - 34.0 pg   MCHC 29.8 (L) 30.0 - 36.0 g/dL   RDW 15.9 (H) 11.5 - 15.5 %   Platelets 372 150 - 400 K/uL   nRBC 0.0 0.0 - 0.2 %    Comment: Performed at [facility], [address] [place], [place] [address]  Glucose, capillary     Status: Abnormal   Collection Time: 06/27/21  7:47 AM  Result Value Ref Range   Glucose-Capillary 53 (L) 70 - 99 mg/dL    Comment: Glucose reference range applies only to samples taken after fasting for at least 8 hours.   Comment 1 Notify RN   Glucose, capillary     Status: Abnormal   Collection Time: 06/27/21  8:20 AM  Result Value Ref Range   Glucose-Capillary 131 (H) 70 - 99 mg/dL    Comment: Glucose reference range applies only to samples taken after fasting for at least 8 hours.   Comment 1 Notify RN   Glucose, capillary     Status: Abnormal   Collection Time: 06/27/21 11:16 AM  Result Value Ref Range   Glucose-Capillary 64 (  L) 70 - 99 mg/dL    Comment: Glucose reference range applies only to samples taken after fasting for at least 8 hours.   Comment 1 Notify RN   Glucose, capillary     Status: None   Collection Time: 06/27/21 12:00 PM  Result Value Ref Range   Glucose-Capillary 90 70 - 99 mg/dL    Comment: Glucose reference range applies only to samples taken after fasting for at least 8 hours.   Comment 1 Notify RN   Glucose, capillary     Status: Abnormal   Collection Time: 06/27/21  4:00 PM  Result Value Ref Range   Glucose-Capillary 48 (L) 70 - 99 mg/dL    Comment: Glucose reference range applies only to samples taken after fasting for at least 8 hours.  Glucose, capillary     Status: Abnormal   Collection Time:  06/27/21  4:49 PM  Result Value Ref Range   Glucose-Capillary 141 (H) 70 - 99 mg/dL    Comment: Glucose reference range applies only to samples taken after fasting for at least 8 hours.  Glucose, capillary     Status: Abnormal   Collection Time: 06/27/21  8:29 PM  Result Value Ref Range   Glucose-Capillary 53 (L) 70 - 99 mg/dL    Comment: Glucose reference range applies only to samples taken after fasting for at least 8 hours.  Glucose, capillary     Status: Abnormal   Collection Time: 06/27/21  9:26 PM  Result Value Ref Range   Glucose-Capillary 158 (H) 70 - 99 mg/dL    Comment: Glucose reference range applies only to samples taken after fasting for at least 8 hours.  Glucose, capillary     Status: None   Collection Time: 06/27/21 11:41 PM  Result Value Ref Range   Glucose-Capillary 98 70 - 99 mg/dL    Comment: Glucose reference range applies only to samples taken after fasting for at least 8 hours.  Glucose, capillary     Status: Abnormal   Collection Time: 06/28/21  3:34 AM  Result Value Ref Range   Glucose-Capillary 69 (L) 70 - 99 mg/dL    Comment: Glucose reference range applies only to samples taken after fasting for at least 8 hours.  Glucose, capillary     Status: None   Collection Time: 06/28/21  4:02 AM  Result Value Ref Range   Glucose-Capillary 76 70 - 99 mg/dL    Comment: Glucose reference range applies only to samples taken after fasting for at least 8 hours.  Basic metabolic panel     Status: Abnormal   Collection Time: 06/28/21  4:55 AM  Result Value Ref Range   Sodium 140 135 - 145 mmol/L   Potassium 3.5 3.5 - 5.1 mmol/L   Chloride 103 98 - 111 mmol/L   CO2 28 22 - 32 mmol/L   Glucose, Bld 82 70 - 99 mg/dL    Comment: Glucose reference range applies only to samples taken after fasting for at least 8 hours.   BUN 10 6 - 20 mg/dL   Creatinine, Ser 0.48 0.44 - 1.00 mg/dL   Calcium 8.6 (L) 8.9 - 10.3 mg/dL   GFR, Estimated >60 >60 mL/min    Comment:  (NOTE) Calculated using the CKD-EPI Creatinine Equation (2021)    Anion gap 9 5 - 15    Comment: Performed at [facility], [address] [place], [place] [address]  Magnesium     Status: None   Collection Time:  06/28/21  4:55 AM  Result Value Ref Range   Magnesium 2.3 1.7 - 2.4 mg/dL    Comment: Performed at [facility], [address] [place], [place] [address]  Phosphorus     Status: None   Collection Time: 06/28/21  4:55 AM  Result Value Ref Range   Phosphorus 3.0 2.5 - 4.6 mg/dL    Comment: Performed at [facility], [address] [place], [place] [address]  CBC     Status: Abnormal   Collection Time: 06/28/21  4:55 AM  Result Value Ref Range   WBC 11.8 (H) 4.0 - 10.5 K/uL   RBC 3.61 (L) 3.87 - 5.11 MIL/uL   Hemoglobin 8.8 (L) 12.0 - 15.0 g/dL   HCT 29.7 (L) 36.0 - 46.0 %   MCV 82.3 80.0 - 100.0 fL   MCH 24.4 (L) 26.0 - 34.0 pg   MCHC 29.6 (L) 30.0 - 36.0 g/dL   RDW 16.0 (H) 11.5 - 15.5 %   Platelets 399 150 - 400 K/uL   nRBC 0.2 0.0 - 0.2 %    Comment: Performed at [facility], [address] [place], [place] [address]  Glucose, capillary     Status: None   Collection Time: 06/28/21  6:25 AM  Result Value Ref Range   Glucose-Capillary 72 70 - 99 mg/dL    Comment: Glucose reference range applies only to samples taken after fasting for at least 8 hours.  Glucose, capillary     Status: None   Collection Time: 06/28/21  7:23 AM  Result Value Ref Range   Glucose-Capillary 87 70 - 99 mg/dL    Comment: Glucose reference range applies only to samples taken after fasting for at least 8 hours.   Comment 1 Notify RN   Glucose, capillary     Status: Abnormal   Collection Time: 06/28/21 11:19 AM  Result Value Ref Range   Glucose-Capillary 201 (H) 70 - 99 mg/dL    Comment: Glucose reference range applies only to samples taken after fasting for at least 8 hours.   Comment 1 Notify RN   Glucose, capillary     Status: Abnormal    Collection Time: 06/28/21  3:18 PM  Result Value Ref Range   Glucose-Capillary 184 (H) 70 - 99 mg/dL    Comment: Glucose reference range applies only to samples taken after fasting for at least 8 hours.  Glucose, capillary     Status: Abnormal   Collection Time: 06/28/21  5:10 PM  Result Value Ref Range   Glucose-Capillary 226 (H) 70 - 99 mg/dL    Comment: Glucose reference range applies only to samples taken after fasting for at least 8 hours.    Current Facility-Administered Medications  Medication Dose Route Frequency Provider Last Rate Last Admin   0.9 %  sodium chloride infusion   Intravenous PRN Flora Lipps, MD   Stopped at 06/27/21 1938   albuterol (PROVENTIL) (2.5 MG/3ML) 0.083% nebulizer solution 2.5 mg  2.5 mg Nebulization Q4H PRN Rust-Chester, Toribio Harbour L, NP       amLODipine (NORVASC) tablet 5 mg  5 mg Oral Daily Benita Gutter, RPH   5 mg at 06/28/21 1025   arformoterol (BROVANA) nebulizer solution 15 mcg  15 mcg Nebulization BID Rust-Chester, Britton L, NP   15 mcg at 06/28/21 0801   aspirin EC tablet 81 mg  81 mg Oral Daily Benita Gutter, RPH   81 mg at 06/28/21 1026   budesonide (PULMICORT)  nebulizer solution 0.25 mg  0.25 mg Nebulization BID Rust-Chester, Toribio Harbour L, NP   0.25 mg at 06/28/21 0801   chlorhexidine gluconate (MEDLINE KIT) (PERIDEX) 0.12 % solution 15 mL  15 mL Mouth Rinse BID Flora Lipps, MD   15 mL at 06/27/21 2050   Chlorhexidine Gluconate Cloth 2 % PADS 6 each  6 each Topical [id number] Flora Lipps, MD   6 each at 06/28/21 0440   clopidogrel (PLAVIX) tablet 75 mg  75 mg Oral Daily Benita Gutter, RPH   75 mg at 06/28/21 1026   dextrose 5 % in lactated ringers infusion   Intravenous Continuous Rust-Chester, Huel Cote, NP 50 mL/hr at 06/28/21 0600 Infusion Verify at 06/28/21 0600   diclofenac Sodium (VOLTAREN) 1 % topical gel 2 g  2 g Topical BID PRN Rust-Chester, Huel Cote, NP       enoxaparin (LOVENOX) injection 55 mg  0.5 mg/kg Subcutaneous Q24H Tyler Pita, MD   55 mg at 06/28/21 1045   ezetimibe (ZETIA) tablet 10 mg  10 mg Oral Daily Benita Gutter, RPH   10 mg at 06/28/21 1026   famotidine (PEPCID) tablet 20 mg  20 mg Oral BID Benita Gutter, RPH   20 mg at 06/28/21 1145   feeding supplement (ENSURE ENLIVE / ENSURE PLUS) liquid 237 mL  237 mL Oral TID BM Juanito Doom, MD       gabapentin (NEURONTIN) capsule 600 mg  600 mg Oral BID Benita Gutter, RPH   600 mg at 06/28/21 1026   hydrALAZINE (APRESOLINE) injection 10-20 mg  10-20 mg Intravenous Q4H PRN Rust-Chester, Huel Cote, NP   10 mg at 06/28/21 8786   ibuprofen (ADVIL) tablet 800 mg  800 mg Oral Q6H PRN Rust-Chester, Huel Cote, NP       insulin aspart (novoLOG) injection 0-20 Units  0-20 Units Subcutaneous TID WC Benita Gutter, RPH   7 Units at 06/28/21 1716   insulin aspart (novoLOG) injection 0-5 Units  0-5 Units Subcutaneous QHS Benita Gutter, RPH       lacosamide (VIMPAT) tablet 100 mg  100 mg Oral BID Juanito Doom, MD       multivitamin with minerals tablet 1 tablet  1 tablet Oral Daily Benita Gutter, RPH       ondansetron [facility]) injection 4 mg  4 mg Intravenous Q6H PRN Rust-Chester, Britton L, NP   4 mg at 06/28/21 7672   oxyCODONE (Oxy IR/ROXICODONE) immediate release tablet 5 mg  5 mg Oral Q6H PRN Simonne Maffucci B, MD   5 mg at 06/28/21 1707   And   oxyCODONE-acetaminophen (PERCOCET/ROXICET) 5-325 MG per tablet 1 tablet  1 tablet Oral Q6H PRN Juanito Doom, MD       polyethylene glycol (MIRALAX / GLYCOLAX) packet 17 g  17 g Oral Daily PRN Rust-Chester, Huel Cote, NP        Musculoskeletal: Strength & Muscle Tone: decreased Gait & Station: unsteady Patient leans: N/A            Psychiatric Specialty Exam:  Presentation  General Appearance: No data recorded Eye Contact:No data recorded Speech:No data recorded Speech Volume:No data recorded Handedness:No data recorded  Mood and Affect  Mood:No data recorded Affect:No  data recorded  Thought Process  Thought Processes:No data recorded Descriptions of Associations:No data recorded Orientation:No data recorded Thought Content:No data recorded History of Schizophrenia/Schizoaffective disorder:No data recorded Duration of Psychotic Symptoms:No data recorded Hallucinations:No data recorded  Ideas of Reference:No data recorded Suicidal Thoughts:No data recorded Homicidal Thoughts:No data recorded  Sensorium  Memory:No data recorded Judgment:No data recorded Insight:No data recorded  Executive Functions  Concentration:No data recorded Attention Span:No data recorded Recall:No data recorded [employer] [place] recorded Language:No data recorded  Psychomotor Activity  Psychomotor Activity:No data recorded  Assets  Assets:No data recorded  Sleep  Sleep:No data recorded  Physical Exam: Physical Exam Vitals and nursing note reviewed.  Constitutional:      Appearance: Normal appearance.  HENT:     Head: Normocephalic and atraumatic.     Mouth/Throat:     Pharynx: Oropharynx is clear.  Eyes:     Pupils: Pupils are equal, round, and reactive to light.  Cardiovascular:     Rate and Rhythm: Normal rate and regular rhythm.  Pulmonary:     Effort: Pulmonary effort is normal.     Breath sounds: Normal breath sounds.  Abdominal:     General: Abdomen is flat.     Palpations: Abdomen is soft.  Musculoskeletal:        General: Normal range of motion.  Skin:    General: Skin is warm and dry.  Neurological:     General: No focal deficit present.     Mental Status: She is alert. Mental status is at baseline.  Psychiatric:        Attention and Perception: Attention normal.        Mood and Affect: Mood normal.        Speech: Speech normal.        Behavior: Behavior is cooperative.        Thought Content: Thought content normal.        Cognition and Memory: Memory is impaired.   Review of Systems  Constitutional: Negative.   HENT:  Negative.    Eyes: Negative.   Respiratory: Negative.    Cardiovascular: Negative.   Gastrointestinal: Negative.   Musculoskeletal: Negative.   Skin: Negative.   Neurological: Negative.   Psychiatric/Behavioral:  Positive for memory loss. Negative for depression, hallucinations, substance abuse and suicidal ideas. The patient is not nervous/anxious and does not have insomnia.   Blood pressure (!) 162/77, pulse 92, temperature 99.7 F (37.6 C), temperature source Axillary, resp. rate (!) 22, height _0  (1.549 m), weight 104.8 kg, last menstrual period 08/15/2008, SpO2 100 %. Body mass index is 43.65 kg/m.  Treatment Plan Summary: Medication management and Plan patient's delirium appears to have resolved.  She is currently alert and oriented appropriate normal affect no sign of confusion or psychosis.  Still unclear what the cause of her condition was.  Patient insists that she was compliant with her pain medicine at the prescribed dosage and had not run out of it.  It is possible that having been out of the trazodone could have resulted in poor sleep for a few days which might have thrown her into a confused delirium combined with possibly withdrawal from venlafaxine.  In any case she now seems to be pretty much back to baseline.  Does not meet commitment criteria.  Other than going back on her previous prescribed medicine as has already been done no specific recommendations.  IVC discontinued paperwork faxed to the ICU.  We will follow up only as needed.  Psychoeducation done with the patient.  Disposition: Patient does not meet criteria for psychiatric inpatient admission. Supportive therapy provided about ongoing stressors.  Alethia Berthold, MD 06/28/2021 6:55 PM

## 2021-06-29 LAB — BASIC METABOLIC PANEL
Anion gap: 12 (ref 5–15)
BUN: 7 mg/dL (ref 6–20)
CO2: 27 mmol/L (ref 22–32)
Calcium: 9.2 mg/dL (ref 8.9–10.3)
Chloride: 105 mmol/L (ref 98–111)
Creatinine, Ser: 0.45 mg/dL (ref 0.44–1.00)
GFR, Estimated: >60 mL/min (ref >60)
Glucose, Bld: 190 mg/dL — ABNORMAL HIGH (ref 70–99)
Potassium: 3.3 mmol/L — ABNORMAL LOW (ref 3.5–5.1)
Sodium: 144 mmol/L (ref 135–145)

## 2021-06-29 LAB — GLUCOSE, CAPILLARY
Glucose-Capillary: 174 mg/dL — ABNORMAL HIGH (ref 70–99)
Glucose-Capillary: 254 mg/dL — ABNORMAL HIGH (ref 70–99)
Glucose-Capillary: 184 mg/dL — ABNORMAL HIGH (ref 70–99)
Glucose-Capillary: 302 mg/dL — ABNORMAL HIGH (ref 70–99)

## 2021-06-29 LAB — CBC
HCT: 41 % (ref 36.0–46.0)
Hemoglobin: 11.7 g/dL — ABNORMAL LOW (ref 12.0–15.0)
MCH: 23.8 pg — ABNORMAL LOW (ref 26.0–34.0)
MCHC: 28.5 g/dL — ABNORMAL LOW (ref 30.0–36.0)
MCV: 83.5 fL (ref 80.0–100.0)
Platelets: 584 10*3/uL — ABNORMAL HIGH (ref 150–400)
RBC: 4.91 MIL/uL (ref 3.87–5.11)
RDW: 16.9 % — ABNORMAL HIGH (ref 11.5–15.5)
WBC: 13.5 10*3/uL — ABNORMAL HIGH (ref 4.0–10.5)
nRBC: 0.2 % (ref 0.0–0.2)

## 2021-06-29 LAB — MAGNESIUM: Magnesium: 2.4 mg/dL (ref 1.7–2.4)

## 2021-06-29 LAB — PHOSPHORUS: Phosphorus: 2.9 mg/dL (ref 2.5–4.6)

## 2021-06-29 LAB — ANA W/REFLEX IF POSITIVE: Anti Nuclear Antibody (ANA): NEGATIVE

## 2021-06-29 MED ORDER — INSULIN GLARGINE-YFGN 100 UNIT/ML ~~LOC~~ SOLN
15.0000 [IU] | Freq: Two times a day (BID) | SUBCUTANEOUS | Status: DC
  Administered 2021-06-29 (×2): 15 [IU] via SUBCUTANEOUS
  Filled 2021-06-29 (×5): qty 0.15

## 2021-06-29 MED ORDER — AMLODIPINE BESYLATE 10 MG PO TABS
10.0000 mg | ORAL_TABLET | Freq: Every day | ORAL | Status: DC
  Administered 2021-06-29 – 2021-06-30 (×2): 10 mg via ORAL
  Filled 2021-06-29 (×2): qty 1

## 2021-06-29 MED ORDER — POTASSIUM CHLORIDE 10 MEQ/100ML IV SOLN
10.0000 meq | INTRAVENOUS | Status: DC
  Administered 2021-06-29: 10 meq via INTRAVENOUS
  Filled 2021-06-29 (×4): qty 100

## 2021-06-29 MED ORDER — FERROUS GLUCONATE 324 (38 FE) MG PO TABS
324.0000 mg | ORAL_TABLET | Freq: Three times a day (TID) | ORAL | Status: DC
  Administered 2021-06-29 – 2021-06-30 (×4): 324 mg via ORAL
  Filled 2021-06-29 (×6): qty 1

## 2021-06-29 MED ORDER — LISINOPRIL 20 MG PO TABS
40.0000 mg | ORAL_TABLET | Freq: Every day | ORAL | Status: DC
  Administered 2021-06-29 – 2021-06-30 (×2): 40 mg via ORAL
  Filled 2021-06-29 (×2): qty 2

## 2021-06-29 MED ORDER — TRAZODONE HCL 100 MG PO TABS
200.0000 mg | ORAL_TABLET | Freq: Every evening | ORAL | Status: DC | PRN
  Administered 2021-06-30: 200 mg via ORAL
  Filled 2021-06-29 (×2): qty 4

## 2021-06-29 MED ORDER — POTASSIUM CHLORIDE CRYS ER 20 MEQ PO TBCR
40.0000 meq | EXTENDED_RELEASE_TABLET | Freq: Once | ORAL | Status: AC
  Administered 2021-06-29: 40 meq via ORAL
  Filled 2021-06-29: qty 2

## 2021-06-29 MED ORDER — POTASSIUM CHLORIDE 20 MEQ PO PACK
20.0000 meq | PACK | Freq: Once | ORAL | Status: AC
  Administered 2021-06-29: 20 meq via ORAL
  Filled 2021-06-29: qty 1

## 2021-06-29 NOTE — Evaluation (Addendum)
Clinical/Bedside Swallow Evaluation Patient Details  Name: Lauren Velazquez MRN: [id number] Date of Birth: 04-10-1961  Today's Date: 06/29/2021 Time: SLP Start Time (ACUTE ONLY): 0935 SLP Stop Time (ACUTE ONLY): 1020 SLP Time Calculation (min) (ACUTE ONLY): 45 min  Past Medical History:  Past Medical History:  Diagnosis Date   Anemia    not currently under treatment   Anxiety    Asthma    during allergy season   Diabetes mellitus without complication (HCC)    Hypertension    Vertigo    Past Surgical History:  Past Surgical History:  Procedure Laterality Date   CARPAL TUNNEL RELEASE Right 2015   KNEE ARTHROSCOPY Left 05/17/2018   Procedure: ARTHROSCOPY KNEE WITH LATERAL RELEASE, PARTIAL SYNOVECTOMY;  Surgeon: Kennedy Bucker, MD;  Location: [facility] OR[facility];  Service: Orthopedics;  Laterality: Left;   KNEE ARTHROSCOPY WITH LATERAL RELEASE Right 12/28/2017   Procedure: KNEE ARTHROSCOPY WITH LATERAL RELEASE;  Surgeon: Kennedy Bucker, MD;  Location: [facility] OR[facility];  Service: Orthopedics;  Laterality: Right;   TIBIAL TUBERCLERPLASTY Bilateral 06/01/2017   Procedure: TIBIAL TUBERCLE SPUR EXCISION;  Surgeon: Kennedy Bucker, MD;  Location: [facility] OR[facility];  Service: Orthopedics;  Laterality: Bilateral;   HPI:  60 year old female w/ medical history significant for Obesity, insulin-dependent diabetes mellitus, hypertension, chronic pain, depression, anxiety, asthma, takes chronic pain meds per report who presented to [facility] ED from home via EMS for multiple complaints including chest pain, shortness of breath and pain all over. ED course: upon arrival patient was agitated and extremely altered, exhibiting bizarre behavior.  She was screaming and yelling sticking her finger down her throat and attempt to make her self vomit.  While sitting in a recliner began attempting to flip it over eventually requiring physical restraint for her own safety.  ED staff attempted to sedate her as she was kicking and swinging arms at staff  and husband.  Her main complaint to the ED provider was chest pain, she stated she was worried that she might have had a heart attack as well as feeling very dehydrated.  She also insisted on being given her chronic pain medication, however she also told staff she will take all her pills and overdose. Patient was placed under IVC.  Per ED documentation, husband denied recreational drug use or any new medications (unclear if this comment was made to EDP or EMS).  While in the ED patient became febrile and sepsis protocol was initiated.  No definitive source of infection noted in CXR/UA, this in combination with severely altered mental status raise concern for meningitis.  Due to patient's increasingly agitated state refractory to benzodiazepines the decision was made by the EDP to mechanically intubate for airway protection in order to provide further sedation so that an LP could be performed safely.  Pt was orally intubated 11/6-11/12/22.  CXR at admit: "No acute cardiopulmonary abnormality.".  Psychiatry is following.  Safety Sitter present in room d/t ongoing agitation and safety concerns d/t mental status decline.    Assessment / Plan / Recommendation  Clinical Impression  Pt appears to present w/ mild oral phase dysphagia w/ suspected impact from declined mental status and poor overall awareness of self and current deficits. Pt required Mild+ support during her self-Feeding of po's and Supervision w/ cues to lessen impulsive feeding behaviors at times. Pt is Missing MOST Dentition which impacts her ability to give effective mastication to solids (recommend cut/choppled meats, moistened well). Pt's mentation continues to wax/wane w/ agitation at times and attempts to make herself vomit --  recommend Supervision w/ ALL oral intake. Encouraged NSG Staff to assist pt in choosing preferred foods. Instructed NSG and pt on swallowing ONE PILL AT A TIME w/ SIP OF THIN LIQUID VIA CUP along w/ general aspiration  precautions w/ all oral intake.  Pt consumed po trials of all consistencies w/ No overt, clinical s/s of aspiration noted; no decline in vocal quality nor respiratory status during/post trials. Vocal quality remained clear. Pt's O2 sats remained in the mid-upper 90s during/post po trials.  OM exam was grossly WFL -- pt inattentive at times. Speech intelligible. Pt required some feeding support.   Recommend a more mech soft consistency diet w/ well-Cut meats, moistened foods d/t Missing Dentition; Thin liquids VIA CUP - NO straws. Recommend general aspiration precautions, Suprvision at meals d/t declined mental status. Pills WHOLE in Puree for safer, easier swallowing as pt described Larger pills causing difficulty to swallow. Reflux precautions. Education given on Pills in Puree; food consistencies and easy to eat options; general aspiration precautions. NSG to reconsult if any new needs arise. NSG agreed SLP Visit Diagnosis: Dysphagia, unspecified (R13.10) (missing MOST Dentition impacting mastication of solids)    Aspiration Risk  Mild aspiration risk;Risk for inadequate nutrition/hydration (reduced when following general aspiration precautions)    Diet Recommendation   Mech Soft/Regular diet w/ Meats cut/chopped well and moistened for ease of mastication d/t missing Dentition; Thin liquids VIA CUP -- NO STRAWS.  General aspiration precautions. Supervision at meals d/t behavioral issues of decreased awareness/impulsivity as well as generalized weakness. Reflux precautions.  Medication Administration: Whole meds with liquid (ONE at a time OR w/ a Puree if needed)    Other  Recommendations Recommended Consults:  (Dietician f/u) Oral Care Recommendations: Oral care BID;Oral care before and after PO;Patient independent with oral care;Staff/trained caregiver to provide oral care (support pt) Other Recommendations:  (n/a)    Recommendations for follow up therapy are one component of a  multi-disciplinary discharge planning process, led by the attending physician.  Recommendations may be updated based on patient status, additional functional criteria and insurance authorization.  Follow up Recommendations No SLP follow up      Assistance Recommended at Discharge Intermittent Supervision/Assistance  Functional Status Assessment Patient has had a recent decline in their functional status and demonstrates the ability to make significant improvements in function in a reasonable and predictable amount of time.  Frequency and Duration  (n/a)   (n/a)       Prognosis Prognosis for Safe Diet Advancement: Fair (-Good) Barriers to Reach Goals: Cognitive deficits;Time post onset;Severity of deficits;Behavior;Medication Barriers/Prognosis Comment: Psychiatry following      Swallow Study   General Date of Onset: 06/20/21 HPI: 60 year old female w/ medical history significant for Obesity, insulin-dependent diabetes mellitus, hypertension, chronic pain, depression, anxiety, asthma, takes chronic pain meds per report who presented to [facility] ED from home via EMS for multiple complaints including chest pain, shortness of breath and pain all over. ED course: upon arrival patient was agitated and extremely altered, exhibiting bizarre behavior.  She was screaming and yelling sticking her finger down her throat and attempt to make her self vomit.  While sitting in a recliner began attempting to flip it over eventually requiring physical restraint for her own safety.  ED staff attempted to sedate her as she was kicking and swinging arms at staff and husband.  Her main complaint to the ED provider was chest pain, she stated she was worried that she might have had a heart attack as well  as feeling very dehydrated.  She also insisted on being given her chronic pain medication, however she also told staff she will take all her pills and overdose. Patient was placed under IVC.  Per ED documentation, husband  denied recreational drug use or any new medications (unclear if this comment was made to EDP or EMS).  While in the ED patient became febrile and sepsis protocol was initiated.  No definitive source of infection noted in CXR/UA, this in combination with severely altered mental status raise concern for meningitis.  Due to patient's increasingly agitated state refractory to benzodiazepines the decision was made by the EDP to mechanically intubate for airway protection in order to provide further sedation so that an LP could be performed safely.  Pt was orally intubated 11/6-11/12/22.  CXR at admit: "No acute cardiopulmonary abnormality.".  Psychiatry is following.  Safety Sitter present in room d/t ongoing agitation and safety concerns d/t mental status decline. Type of Study: Bedside Swallow Evaluation Previous Swallow Assessment: none Diet Prior to this Study: Regular;Thin liquids Temperature Spikes Noted: No (wbc 13.5) Respiratory Status: Nasal cannula (3L) History of Recent Intubation: Yes Length of Intubations (days): 6 days Date extubated: 06/26/21 Behavior/Cognition: Alert;Cooperative;Pleasant mood;Confused;Distractible;Requires cueing (poor insight; overall weakness) Oral Cavity Assessment: Within Functional Limits Oral Care Completed by SLP: Recent completion by staff Oral Cavity - Dentition: Poor condition;Missing dentition (missing most dentition; few front lower teeth) Vision: Functional for self-feeding Self-Feeding Abilities: Able to feed self;Needs assist;Needs set up (poor awareness; impulsive) Patient Positioning: Upright in bed (needed positioning) Baseline Vocal Quality: Normal Volitional Cough: Strong Volitional Swallow: Able to elicit    Oral/Motor/Sensory Function Overall Oral Motor/Sensory Function: Within functional limits   Ice Chips Ice chips: Not tested   Thin Liquid Thin Liquid: Within functional limits Presentation: Cup;Self Fed (10+ trials) Other Comments:  impulsive    Nectar Thick Nectar Thick Liquid: Not tested   Honey Thick Honey Thick Liquid: Not tested   Puree Puree: Within functional limits Presentation: Self Fed;Spoon (5 trials)   Solid     Solid: Impaired Presentation: Self Fed (10 trials) Oral Phase Impairments: Poor awareness of bolus;Impaired mastication (impulsive; missing Dentition) Oral Phase Functional Implications: Impaired mastication (missing dentition) Pharyngeal Phase Impairments:  (none) Other Comments: needed monitoring d/t impulsivity        Lauren Som, MS, CCC-SLP Speech Language Pathologist Rehab Services [phone] Lauren Velazquez 06/29/2021,4:46 PM

## 2021-06-29 NOTE — Progress Notes (Signed)
Palliative:  Consult received. Chart reviewed.  Attempted to meet with patient to discuss goals of care. When I enter the room she is sticking her fingers down her throat and gagging. Bedside sitter reports she just started doing this. Patient tells me she thinks she will feel better if she can vomit. Once she stopped and was no longer gagging and I introduced myself as palliative care and asked if we could discuss her care. She declined speaking further.  From brief assessment and chart review, it seems that patient is alert and oriented and competent to make her own decisions. Therefore, I will not reach out to family for further discussion.   Discussed with Dr. Allena Katz.  Will cancel consult.  Please re-consult if needs for palliative medicine team arise.  Gerlean Ren, DNP, AGNP-C Palliative Medicine Team Team Phone # [phone]  Pager # [phone]  NO CHARGE

## 2021-06-29 NOTE — Progress Notes (Signed)
Inpatient Rehab Admissions Coordinator :  Per therapy recommendations, patient was screened for CIR candidacy by Ottie Glazier RN MSN.  It looks like her [employer] is non participating with [facility]. I am unable to tell who contracted with. Recommend other AIR rehab contracted to be pursued. I have alerted SW, Teresita. We will not pursue [facility] at this time. Please call me with any questions.  Ottie Glazier RN MSN Admissions Coordinator [phone]

## 2021-06-29 NOTE — Evaluation (Signed)
Occupational Therapy Evaluation Patient Details Name: Lauren Velazquez MRN: [id number] DOB: 27-Apr-1961 Today's Date: 06/29/2021   History of Present Illness 60 yo woman admitted with acute encephalopathy in the setting of suspected viral meningitis vs. serotonin syndrome vs. 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   Clinical Impression   Pt seen for OT/PT co-evaluation this date to address address functional/ADL transfers and to address cognition/behavior during functional activity. Pt alert and oriented to self, place, date, and some aspects of situation (pt is aware she was aggressive with staff, but does not remember). Pt presents with decreased short term memory, however no agitation noted during session. Prior to admission, pt reports she was independent with ADLs and functional mobility, living in a 1-story home with husband. Pt reports she recently lost her job, but drives at baseline. Husband present throughout session and confirming PLOF. Pt currently presents with impaired cognition, decreased strength, and decreased activity tolerance. Pt performed bed-level grooming tasks with SUPERVISION/SET-UP this date however fatigued quickly, frequently dropping grooming tools. Pt currently requires MOD A for bed mobility, MOD A for sit>stand transfers, and MOD Ax2 for lateral steps with RW. At end of session, pt stating goal to return to walking independently and verbalized that she is motivated to participate in STR prior to return to home to maximize return to PLOF. Upon discharge, recommend acute inpatient rehab (3 hours/day).      Recommendations for follow up therapy are one component of a multi-disciplinary discharge planning process, led by the attending physician.  Recommendations may be updated based on patient status, additional functional criteria and insurance authorization.    Follow Up Recommendations  Acute inpatient rehab (3hours/day)    Assistance Recommended at Discharge Frequent or constant Supervision/Assistance  Functional Status Assessment  Patient has had a recent decline in their functional status and demonstrates the ability to make significant improvements in function in a reasonable and predictable amount of time.  Equipment Recommendations  Other (comment) (defer to next venue of care)       Precautions / Restrictions Precautions Precautions: Fall Restrictions Weight Bearing Restrictions: No      Mobility Bed Mobility Overal bed mobility: Needs Assistance Bed Mobility: Supine to Sit;Sit to Supine     Supine to sit: Mod assist;HOB elevated Sit to supine: Min assist   General bed mobility comments: MOD A required for trunk support, although pt putting forth good effort to push up from bed using b/l UE    Transfers Overall transfer level: Needs assistance Equipment used: Rolling walker (2 wheels) Transfers: Sit to/from Stand Sit to Stand: Mod assist           General transfer comment: Requires verbal cues for safe hand placement with RW use      Balance Overall balance assessment: Needs assistance Sitting-balance support: Bilateral upper extremity supported;Feet unsupported Sitting balance-Leahy Scale: Good Sitting balance - Comments: Pt able to maintain good static sitting balance reaching within BOS at EOB   Standing balance support: Bilateral upper extremity supported;During functional activity Standing balance-Leahy Scale: Poor Standing balance comment: Pt requires MOD A+2 for taking lateral steps toward [facility] with RW                           ADL either performed or assessed with clinical judgement   ADL Overall ADL's : Needs assistance/impaired     Grooming: Brushing hair;Supervision/safety;Set  up;Bed level Grooming Details (indicate cue type and reason): Pt fatigues quickly, frequently dropping  brush, however able to complete without physical assist. Requires verbal cues for throughness             Lower Body Dressing: Maximal assistance;Bed level Lower Body Dressing Details (indicate cue type and reason): to don/doff socks             Functional mobility during ADLs: Moderate assistance;+2 for physical assistance;Rolling walker (2 wheels)       Vision Baseline Vision/History: 1 Wears glasses Patient Visual Report: No change from baseline              Pertinent Vitals/Pain Pain Assessment: Faces Faces Pain Scale: Hurts a little bit Pain Location: R foot during AROM exercises Pain Descriptors / Indicators: Aching;Tender Pain Intervention(s): Limited activity within patient's tolerance;Monitored during session        Extremity/Trunk Assessment Upper Extremity Assessment Upper Extremity Assessment: RUE deficits/detail;LUE deficits/detail RUE Deficits / Details: AROM of shoulder ~30 degrees (pt has bursitis at baseline). Elbow flex 3+/5 LUE Deficits / Details: Shoulder flexion 3+/5, elbow flex 3+/5   Lower Extremity Assessment Lower Extremity Assessment: Generalized weakness;Defer to PT evaluation       Communication Communication Communication: No difficulties   Cognition Arousal/Alertness: Awake/alert Behavior During Therapy: WFL for tasks assessed/performed Overall Cognitive Status: Impaired/Different from baseline                                 General Comments: Pt alert and oriented to self, place, date, and some aspects of situation. Pt presents with decreased short term memory. No agitation noted during session, however tansgential at times, requiring cues for sustained attention     General Comments  while on 3L/min of O2, SpO2 > 92% throughout    Exercises Other Exercises Other Exercises: Educated pt on role of OT, POC, and discharge recommendations, with pt verbalizing understanding        Home Living Family/patient  expects to be discharged to:: Private residence Living Arrangements: Spouse/significant other Available Help at Discharge: Family;Available 24 hours/day Type of Home: House Home Access: Stairs to enter [employer] of Steps: 3 Entrance Stairs-Rails: Right Home Layout: One level     Bathroom Shower/Tub: Tub/shower unit             Additional Comments: Uses oxygen at home (3L). Per previous encouter, pt has standard walker      Prior Functioning/Environment Prior Level of Function : Independent/Modified Independent             Mobility Comments: Pt reports that she was independent (without AD) for functional mobilty. ADLs Comments: Pt reports that she was independent with ADLs. Pt recently lost her job, but drives        OT Problem List: Decreased strength;Decreased activity tolerance;Impaired balance (sitting and/or standing);Decreased cognition;Decreased safety awareness      OT Treatment/Interventions: Self-care/ADL training;Therapeutic exercise;Energy conservation;DME and/or AE instruction;Therapeutic activities;Patient/family education;Balance training    OT Goals(Current goals can be found in the care plan section) Acute Rehab OT Goals Patient Stated Goal: to return home OT Goal Formulation: With patient Time For Goal Achievement: 07/13/21 Potential to Achieve Goals: Fair ADL Goals Pt Will Perform Grooming: with set-up;with supervision;sitting Pt Will Perform Lower Body Dressing: with min assist;with adaptive equipment;sit to/from stand Pt Will Transfer to Toilet: with mod assist;stand pivot transfer;bedside commode  OT Frequency: Min 3X/week    AM-PAC OT "  6 Clicks" Daily Activity     Outcome Measure Help from another person eating meals?: None Help from another person taking care of personal grooming?: A Little Help from another person toileting, which includes using toliet, bedpan, or urinal?: A Lot Help from another person bathing (including  washing, rinsing, drying)?: A Lot Help from another person to put on and taking off regular upper body clothing?: A Little Help from another person to put on and taking off regular lower body clothing?: A Lot 6 Click Score: 16   End of Session Equipment Utilized During Treatment: Gait belt;Rolling walker (2 wheels) Nurse Communication: Mobility status  Activity Tolerance: Patient tolerated treatment well Patient left: in bed;with call bell/phone within reach;with bed alarm set  OT Visit Diagnosis: Unsteadiness on feet (R26.81);Muscle weakness (generalized) (M62.81)                Time: 4585-9292 OT Time Calculation (min): 40 min Charges:  OT General Charges $OT Visit: 1 Visit OT Evaluation $OT Eval Moderate Complexity: 1 Mod OT Treatments $Self Care/Home Management : 8-22 mins  Matthew Folks, OTR/L [facility] [phone]

## 2021-06-29 NOTE — Evaluation (Signed)
Physical Therapy Evaluation Patient Details Name: Lauren Velazquez MRN: [id number] DOB: 05-Aug-1961 Today's Date: 06/29/2021  History of Present Illness  60 yo woman admitted with acute encephalopathy in the setting of suspected viral meningitis vs. serotonin syndrome vs. 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  Clinical Impression  Pt asleep with spouse present in room. PT/OT co-treat this session to address functional deficits and ADLs and for pt/therapist safety. Pt requires several bouts of arousal with sternal rub, lights, and auditory cues to awaken. Pt keeps eyes closed throughout majority of session but responds appropriately when questioned. Pt states independence with all mobility and ADLs prior to recent admission. Pt cooperative throughout treatment and demonstrates good motivation to continue treatment in order to eventually return home. Primary limitations are decreased strength, balance, functional mobility with changes in STM. Pt showed good effort with appropriate cueing with MOD-A for most tasks. Due to significant changes from PLOF and pt's current level of motivation to return to baseline functioning CIR primary discharge recommendation. Skilled PT intervention is indicated to address deficits in function, mobility, and to return to PLOF as able.       Recommendations for follow up therapy are one component of a multi-disciplinary discharge planning process, led by the attending physician.  Recommendations may be updated based on patient status, additional functional criteria and insurance authorization.  Follow Up Recommendations Acute inpatient rehab (3hours/day)    Assistance Recommended at Discharge Frequent or constant Supervision/Assistance  Functional Status Assessment Patient has had a recent decline in their functional status and demonstrates the ability to  make significant improvements in function in a reasonable and predictable amount of time.  Equipment Recommendations  Other (comment) (TBD next venue of care)    Recommendations for Other Services       Precautions / Restrictions Precautions Precautions: Fall Restrictions Weight Bearing Restrictions: No      Mobility  Bed Mobility Overal bed mobility: Needs Assistance Bed Mobility: Supine to Sit;Sit to Supine     Supine to sit: Mod assist;HOB elevated Sit to supine: Min assist   General bed mobility comments: Pt puts forth good effort through lower UE to push trunk up and able to mobilize BLE, assit for trunk momentum    Transfers Overall transfer level: Needs assistance Equipment used: Rolling walker (2 wheels) Transfers: Sit to/from Stand Sit to Stand: Mod assist           General transfer comment: x 3 w/ cues for hand placement x 2, decreased WB on RLE but able to weight shift with CGA    Ambulation/Gait Ambulation/Gait assistance: Mod assist;+2 physical assistance;Min assist   Assistive device: Rolling walker (2 wheels) Gait Pattern/deviations: Step-to pattern       General Gait Details: able to step towards HOB (full length) with slow steps with assistance for RW management w/ seated rest break x 1; Min-mod assist pending pt fatigue  Stairs            Wheelchair Mobility    Modified Rankin (Stroke Patients Only)       Balance Overall balance assessment: Needs assistance Sitting-balance support: Feet supported;Bilateral upper extremity supported Sitting balance-Leahy Scale: Good Sitting balance - Comments: does not require BUE support, lifts arms without lean or LOB   Standing balance support: Bilateral upper extremity supported;During functional activity Standing balance-Leahy Scale: Poor Standing balance comment: MOD A for balance  Pertinent Vitals/Pain Pain Assessment: Faces Faces Pain Scale:  Hurts little more Pain Location: Bilat shoulders L >R Pain Descriptors / Indicators: Aching;Tender;Discomfort;Grimacing Pain Intervention(s): Limited activity within patient's tolerance;Monitored during session    Home Living Family/patient expects to be discharged to:: Private residence Living Arrangements: Spouse/significant other Available Help at Discharge: Family;Available 24 hours/day Type of Home: House Home Access: Stairs to enter Entrance Stairs-Rails: Right Entrance Stairs-Number of Steps: 3   Home Layout: One level Home Equipment: Firefighter Additional Comments: Uses 3 L O2 at home    Prior Function Prior Level of Function : Independent/Modified Independent             Mobility Comments: Pt notes indep without AD for functional mobility ADLs Comments: Pt drives, use to work and recently lost her job, indep w/ ADLs     Hand Dominance        Extremity/Trunk Assessment   Upper Extremity Assessment Upper Extremity Assessment: Generalized weakness (Decreased R shoulder flex/abd above 45 degrees 2/2 pain) RUE Deficits / Details: AROM of shoulder ~30 degrees (pt has bursitis at baseline). Elbow flex 3+/5 LUE Deficits / Details: Shoulder flexion 3+/5, elbow flex 3+/5    Lower Extremity Assessment Lower Extremity Assessment: Generalized weakness (Able to move BLE against gravity; decreased sensation L foot w/ intact proprioception; demonstrates decreased AROM DF w/ pain (unable to obtain neutral) but PROM ~ 5-7 degrees DF.)       Communication   Communication: No difficulties  Cognition Arousal/Alertness: Awake/alert Behavior During Therapy: WFL for tasks assessed/performed Overall Cognitive Status: Impaired/Different from baseline                                 General Comments: oriented to self, place, date, does not remember majority of situation and presents w/ STM; pt is conversive and does not demonstrate agitiation; able to follow one  step commands with cues to redirect attention        General Comments General comments (skin integrity, edema, etc.): BP end of treatment: 159/79, SpO2 92%    Exercises Other Exercises Other Exercises: Educated pt on role of OT, POC, and discharge recommendations, with pt verbalizing understanding   Assessment/Plan    PT Assessment Patient needs continued PT services  PT Problem List Decreased strength;Decreased mobility;Decreased safety awareness;Decreased range of motion;Decreased balance;Decreased activity tolerance;Decreased cognition       PT Treatment Interventions Therapeutic exercise;Gait training;Stair training;Therapeutic activities;Neuromuscular re-education    PT Goals (Current goals can be found in the Care Plan section)  Acute Rehab PT Goals Patient Stated Goal: get strong PT Goal Formulation: With patient Time For Goal Achievement: 07/13/21 Potential to Achieve Goals: Good    Frequency Min 2X/week   Barriers to discharge        Co-evaluation PT/OT/SLP Co-Evaluation/Treatment: Yes Reason for Co-Treatment: Complexity of the patient's impairments (multi-system involvement);Necessary to address cognition/behavior during functional activity;For patient/therapist safety;To address functional/ADL transfers PT goals addressed during session: Mobility/safety with mobility OT goals addressed during session: ADL's and self-care       AM-PAC PT "6 Clicks" Mobility  Outcome Measure Help needed turning from your back to your side while in a flat bed without using bedrails?: A Little Help needed moving from lying on your back to sitting on the side of a flat bed without using bedrails?: A Lot Help needed moving to and from a bed to a chair (including a wheelchair)?: A Lot Help needed standing  up from a chair using your arms (e.g., wheelchair or bedside chair)?: A Lot Help needed to walk in hospital room?: A Lot Help needed climbing 3-5 steps with a railing? : A Lot 6  Click Score: 13    End of Session Equipment Utilized During Treatment: Gait belt Activity Tolerance: Patient tolerated treatment well Patient left: in bed;with call bell/phone within reach;with family/visitor present Nurse Communication: Mobility status PT Visit Diagnosis: Unsteadiness on feet (R26.81);Muscle weakness (generalized) (M62.81);Other abnormalities of gait and mobility (R26.89);Other symptoms and signs involving the nervous system (R29.898)    Time: 2683-4196 PT Time Calculation (min) (ACUTE ONLY): 39 min   Charges:             [employer], SPT

## 2021-06-29 NOTE — TOC Progression Note (Signed)
Transition of Care [facility]) - Progression Note    Patient Details  Name: Lauren Velazquez MRN: [id number] Date of Birth: 18-Sep-1960  Transition of Care [facility]) CM/SW Contact  Joseph Art, [place] Phone Number: 06/29/2021, 4:20 PM  Clinical Narrative:     OT recommending CIR and PT recommending rehab.  CSW sent referral to CIR, requesting review.  CSW left voicemail for patient's spouse Lauren, Velazquez (Spouse) [phone] (Mobile) to discuss disposition.  Attending updated.       Expected Discharge Plan and Services                                                 Social Determinants of Health (SDOH) Interventions    Readmission Risk Interventions No flowsheet data found.

## 2021-06-29 NOTE — Consult Note (Signed)
PHARMACY CONSULT NOTE  Pharmacy Consult for Electrolyte Monitoring and Replacement   Recent Labs: Potassium (mmol/L)  Date Value  06/29/2021 3.3 (L)  11/19/2014 3.8   Magnesium (mg/dL)  Date Value  04/54/0981 2.4   Calcium (mg/dL)  Date Value  19/14/7829 9.2   Calcium, Total (mg/dL)  Date Value  56/21/3086 9.4   Albumin (g/dL)  Date Value  57/84/6962 2.9 (L)  11/19/2014 3.9   Phosphorus (mg/dL)  Date Value  95/28/4132 2.9   Sodium (mmol/L)  Date Value  06/29/2021 144  11/19/2014 139   Assessment: Patient is a 60 y/o F with medical history including diabetes, HTN, vertigo, HLD, anxiety, chronic back pain who presented to the ED 11/5 with altered mentation. Patient subsequently found to have elevated lactic acid and leukocytosis without obvious source. There is concern for meningitis and lumbar puncture was performed and patient started on broad spectrum antimicrobial coverage. Patient ultimately required intubation for airway protection due to worsening agitation. Patient is currently admitted to the ICU. Pharmacy consulted to assist with electrolyte monitoring and replacement as indicated.  Patient extubated 11/12  Diet: Diet started 11/14  Goal of Therapy:  Electrolytes within normal limits  Plan:  --K 3.3, PO Kcl 40 mEq x 1 + IV Kcl 10 mEq x 4 (80 mEq K+ total) --Follow-up electrolytes with morning labs  Tressie Ellis 06/29/2021 7:13 AM
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
## 2021-06-29 NOTE — Progress Notes (Signed)
[facility] [place] NAME: Lauren Velazquez    MR#:  [id number]  DATE OF BIRTH:  1961-03-08  SUBJECTIVE:  according to the staff patient was agitated last night. Sitter was at bedside. This morning very calm and pleasant. Answered most questions appropriately. Alert and oriented. Complains of weakness. Tolerating PO diet. Denies any chest pain. No family at bedside  REVIEW OF SYSTEMS:   Review of Systems  Constitutional:  Negative for chills, fever and weight loss.  HENT:  Negative for ear discharge, ear pain and nosebleeds.   Eyes:  Negative for blurred vision, pain and discharge.  Respiratory:  Negative for sputum production, shortness of breath, wheezing and stridor.   Cardiovascular:  Negative for chest pain, palpitations, orthopnea and PND.  Gastrointestinal:  Negative for abdominal pain, diarrhea, nausea and vomiting.  Genitourinary:  Negative for frequency and urgency.  Musculoskeletal:  Negative for back pain and joint pain.  Neurological:  Positive for weakness. Negative for sensory change, speech change and focal weakness.  Psychiatric/Behavioral:  Negative for depression and hallucinations. The patient is not nervous/anxious.   Tolerating Diet:yes Tolerating PT: CIR  DRUG ALLERGIES:   Allergies  Allergen Reactions   Atorvastatin Swelling    VITALS:  Blood pressure (!) 156/93, pulse (!) 101, temperature 98.7 F (37.1 C), resp. rate 18, height 5\' 1"  (1.549 m), weight 104.8 kg, last menstrual period 08/15/2008, SpO2 94 %.  PHYSICAL EXAMINATION:   Physical Exam  GENERAL:  60 y.o.-year-old patient lying in the bed with no acute distress. Morbid obesity HEENT: Head atraumatic, normocephalic. Oropharynx and nasopharynx clear.  LUNGS: decreased breath sounds bilaterally, no wheezing, rales, rhonchi. No use of accessory muscles of respiration.  CARDIOVASCULAR: S1, S2 normal. No murmurs, rubs, or gallops.  ABDOMEN: Soft, nontender,  nondistended. Bowel sounds present. No organomegaly or mass.  EXTREMITIES: No cyanosis, clubbing or edema b/l.    NEUROLOGIC: nonfocal PSYCHIATRIC:  patient is alert and oriented x 2.  SKIN: No obvious rash, lesion, or ulcer.   LABORATORY PANEL:  CBC Recent Labs  Lab 06/29/21 0356  WBC 13.5*  HGB 11.7*  HCT 41.0  PLT 584*    Chemistries  Recent Labs  Lab 06/27/21 0507 06/28/21 0455 06/29/21 0356  NA 141   < > 144  K 3.2*   < > 3.3*  CL 102   < > 105  CO2 31   < > 27  GLUCOSE 98   < > 190*  BUN 10   < > 7  CREATININE 0.53   < > 0.45  CALCIUM 8.6*   < > 9.2  MG 2.1   < > 2.4  AST 32  --   --   ALT 18  --   --   ALKPHOS 61  --   --   BILITOT 0.3  --   --    < > = values in this interval not displayed.   Cardiac Enzymes No results for input(s): TROPONINI in the last 168 hours. RADIOLOGY:  No results found. ASSESSMENT AND PLAN:   60 yo woman with pmhx significant for DM2, HTN, vertigo, AMS admitted with acute encephalopathy requiring intubation for airway protection who is now extubated and currently on 2 to 3 L nasal cannula oxygen. Patient wears chronic oxygen at home.  Acute metabolic encephalopathy with agitation and bizarre behavior -- differential diagnosis was possibility of meningitis versus serotonin syndrome versus stroke versus polypharmacy -- meningitis was ruled out. Patient  is off IV antibiotics. -- MRI showed evidence of embolic stroke. Neurology Dr. Selina Velazquez recommends aspirin and Plavix. She also recommends vimpat for now till she is follwoed by out pt neurology -- patient is much alert awake and oriented. -- TEE was done ruled out embolic event -- Echo showed normal LV EF, grade one diastolic dysfunction, no significant valvular abnormality and no anti-cardiac clot -- EEG showed moderate diffuse slowing with subtle superimpose right focal showing without epileptiform abnormality-- now on empiric vimpat  acute delirium with agitation  -- patient was  seen by psychiatry Dr. Toni Velazquez. IVC has been discontinued. No further recommendations other than continue patient's trazodone and venlafaxine. --pt pleasant no sitter required  type II diabetes with neuropathy -- A1c 8.2 -- continue long-acting insulin and sliding scale -- continue gabapentin  Hypertension -- continue lisinopril  Genella Rife continue Pepcid  history of COPD/asthma -- on chronic home oxygen -- continue inhalers as needed  morbid obesity    Procedures: Family communication :none Consults :PCCM, neurology, ID, Psych CODE STATUS: full DVT Prophylaxis :lovenox Level of care: Med-Surg Status is: Inpatient  Remains inpatient appropriate because: discharge planning 25        TOTAL TIME TAKING CARE OF THIS PATIENT: 25 minutes.  >50% time spent on counselling and coordination of care  Note: This dictation was prepared with Dragon dictation along with smaller phrase technology. Any transcriptional errors that result from this process are unintentional.  Lauren Velazquez M.D    [facility]   CC: Primary care physician; Hillery Aldo, MD Patient ID: Lauren Velazquez, female   DOB: Apr 12, 1961, 60 y.o.   MRN: [id number]

## 2021-06-30 ENCOUNTER — Inpatient Hospital Stay: Payer: BLUE CROSS/BLUE SHIELD

## 2021-06-30 ENCOUNTER — Other Ambulatory Visit: Payer: Self-pay

## 2021-06-30 ENCOUNTER — Inpatient Hospital Stay (HOSPITAL_COMMUNITY)
Admission: AD | Admit: 2021-06-30 | Discharge: 2021-07-20 | DRG: 100 | Disposition: A | Discharge status: Skilled Nursing Facility | Payer: BLUE CROSS/BLUE SHIELD | Source: Other Acute Inpatient Hospital | Attending: Internal Medicine | Admitting: Internal Medicine

## 2021-06-30 ENCOUNTER — Encounter (HOSPITAL_COMMUNITY): Payer: Self-pay | Admitting: Pulmonary Disease

## 2021-06-30 DIAGNOSIS — K5900 Constipation, unspecified: Secondary | ICD-10-CM | POA: Diagnosis present

## 2021-06-30 DIAGNOSIS — I6340 Cerebral infarction due to embolism of unspecified cerebral artery: Secondary | ICD-10-CM | POA: Diagnosis present

## 2021-06-30 DIAGNOSIS — Z20822 Contact with and (suspected) exposure to covid-19: Secondary | ICD-10-CM | POA: Diagnosis present

## 2021-06-30 DIAGNOSIS — G928 Other toxic encephalopathy: Secondary | ICD-10-CM | POA: Diagnosis present

## 2021-06-30 DIAGNOSIS — D500 Iron deficiency anemia secondary to blood loss (chronic): Secondary | ICD-10-CM | POA: Diagnosis not present

## 2021-06-30 DIAGNOSIS — I639 Cerebral infarction, unspecified: Secondary | ICD-10-CM | POA: Diagnosis not present

## 2021-06-30 DIAGNOSIS — N939 Abnormal uterine and vaginal bleeding, unspecified: Secondary | ICD-10-CM

## 2021-06-30 DIAGNOSIS — T43595A Adverse effect of other antipsychotics and neuroleptics, initial encounter: Secondary | ICD-10-CM | POA: Diagnosis present

## 2021-06-30 DIAGNOSIS — D509 Iron deficiency anemia, unspecified: Secondary | ICD-10-CM | POA: Diagnosis present

## 2021-06-30 DIAGNOSIS — J9601 Acute respiratory failure with hypoxia: Secondary | ICD-10-CM

## 2021-06-30 DIAGNOSIS — D649 Anemia, unspecified: Secondary | ICD-10-CM | POA: Diagnosis not present

## 2021-06-30 DIAGNOSIS — I82502 Chronic embolism and thrombosis of unspecified deep veins of left lower extremity: Secondary | ICD-10-CM | POA: Diagnosis present

## 2021-06-30 DIAGNOSIS — Z781 Physical restraint status: Secondary | ICD-10-CM

## 2021-06-30 DIAGNOSIS — N390 Urinary tract infection, site not specified: Secondary | ICD-10-CM | POA: Diagnosis present

## 2021-06-30 DIAGNOSIS — J984 Other disorders of lung: Secondary | ICD-10-CM | POA: Diagnosis present

## 2021-06-30 DIAGNOSIS — B961 Klebsiella pneumoniae [K. pneumoniae] as the cause of diseases classified elsewhere: Secondary | ICD-10-CM | POA: Diagnosis present

## 2021-06-30 DIAGNOSIS — I10 Essential (primary) hypertension: Secondary | ICD-10-CM | POA: Diagnosis present

## 2021-06-30 DIAGNOSIS — R339 Retention of urine, unspecified: Secondary | ICD-10-CM | POA: Diagnosis not present

## 2021-06-30 DIAGNOSIS — Y929 Unspecified place or not applicable: Secondary | ICD-10-CM

## 2021-06-30 DIAGNOSIS — E1165 Type 2 diabetes mellitus with hyperglycemia: Secondary | ICD-10-CM | POA: Diagnosis present

## 2021-06-30 DIAGNOSIS — I959 Hypotension, unspecified: Secondary | ICD-10-CM | POA: Diagnosis not present

## 2021-06-30 DIAGNOSIS — G9341 Metabolic encephalopathy: Secondary | ICD-10-CM

## 2021-06-30 DIAGNOSIS — Z794 Long term (current) use of insulin: Secondary | ICD-10-CM

## 2021-06-30 DIAGNOSIS — Z4659 Encounter for fitting and adjustment of other gastrointestinal appliance and device: Secondary | ICD-10-CM | POA: Diagnosis not present

## 2021-06-30 DIAGNOSIS — G9340 Encephalopathy, unspecified: Secondary | ICD-10-CM

## 2021-06-30 DIAGNOSIS — I824Z2 Acute embolism and thrombosis of unspecified deep veins of left distal lower extremity: Secondary | ICD-10-CM | POA: Diagnosis not present

## 2021-06-30 DIAGNOSIS — Z79891 Long term (current) use of opiate analgesic: Secondary | ICD-10-CM

## 2021-06-30 DIAGNOSIS — J690 Pneumonitis due to inhalation of food and vomit: Secondary | ICD-10-CM | POA: Diagnosis present

## 2021-06-30 DIAGNOSIS — G894 Chronic pain syndrome: Secondary | ICD-10-CM | POA: Diagnosis present

## 2021-06-30 DIAGNOSIS — E785 Hyperlipidemia, unspecified: Secondary | ICD-10-CM | POA: Diagnosis present

## 2021-06-30 DIAGNOSIS — R1310 Dysphagia, unspecified: Secondary | ICD-10-CM | POA: Diagnosis present

## 2021-06-30 DIAGNOSIS — Z6841 Body Mass Index (BMI) 40.0 and over, adult: Secondary | ICD-10-CM

## 2021-06-30 DIAGNOSIS — E538 Deficiency of other specified B group vitamins: Secondary | ICD-10-CM | POA: Diagnosis present

## 2021-06-30 DIAGNOSIS — I6310 Cerebral infarction due to embolism of unspecified precerebral artery: Secondary | ICD-10-CM | POA: Diagnosis not present

## 2021-06-30 DIAGNOSIS — F419 Anxiety disorder, unspecified: Secondary | ICD-10-CM | POA: Diagnosis present

## 2021-06-30 DIAGNOSIS — I6300 Cerebral infarction due to thrombosis of unspecified precerebral artery: Secondary | ICD-10-CM | POA: Diagnosis not present

## 2021-06-30 DIAGNOSIS — R4182 Altered mental status, unspecified: Secondary | ICD-10-CM | POA: Diagnosis not present

## 2021-06-30 DIAGNOSIS — I82409 Acute embolism and thrombosis of unspecified deep veins of unspecified lower extremity: Secondary | ICD-10-CM

## 2021-06-30 DIAGNOSIS — N950 Postmenopausal bleeding: Secondary | ICD-10-CM | POA: Diagnosis present

## 2021-06-30 DIAGNOSIS — R569 Unspecified convulsions: Secondary | ICD-10-CM | POA: Diagnosis present

## 2021-06-30 DIAGNOSIS — I63413 Cerebral infarction due to embolism of bilateral middle cerebral arteries: Secondary | ICD-10-CM | POA: Diagnosis not present

## 2021-06-30 DIAGNOSIS — R34 Anuria and oliguria: Secondary | ICD-10-CM | POA: Diagnosis present

## 2021-06-30 DIAGNOSIS — Z79899 Other long term (current) drug therapy: Secondary | ICD-10-CM

## 2021-06-30 DIAGNOSIS — R410 Disorientation, unspecified: Secondary | ICD-10-CM | POA: Diagnosis not present

## 2021-06-30 DIAGNOSIS — Z9981 Dependence on supplemental oxygen: Secondary | ICD-10-CM | POA: Diagnosis not present

## 2021-06-30 DIAGNOSIS — G40909 Epilepsy, unspecified, not intractable, without status epilepticus: Principal | ICD-10-CM

## 2021-06-30 DIAGNOSIS — J9621 Acute and chronic respiratory failure with hypoxia: Secondary | ICD-10-CM | POA: Diagnosis present

## 2021-06-30 DIAGNOSIS — E119 Type 2 diabetes mellitus without complications: Secondary | ICD-10-CM

## 2021-06-30 DIAGNOSIS — G4733 Obstructive sleep apnea (adult) (pediatric): Secondary | ICD-10-CM | POA: Diagnosis present

## 2021-06-30 DIAGNOSIS — R509 Fever, unspecified: Secondary | ICD-10-CM

## 2021-06-30 DIAGNOSIS — R0689 Other abnormalities of breathing: Secondary | ICD-10-CM

## 2021-06-30 DIAGNOSIS — I82402 Acute embolism and thrombosis of unspecified deep veins of left lower extremity: Secondary | ICD-10-CM | POA: Diagnosis not present

## 2021-06-30 DIAGNOSIS — R739 Hyperglycemia, unspecified: Secondary | ICD-10-CM | POA: Diagnosis not present

## 2021-06-30 DIAGNOSIS — Z91119 Patient's noncompliance with dietary regimen due to unspecified reason: Secondary | ICD-10-CM

## 2021-06-30 DIAGNOSIS — A419 Sepsis, unspecified organism: Secondary | ICD-10-CM | POA: Diagnosis not present

## 2021-06-30 LAB — POCT I-STAT 7, (LYTES, BLD GAS, ICA,H+H)
Acid-Base Excess: 4 mmol/L — ABNORMAL HIGH (ref 0.0–2.0)
Bicarbonate: 28.6 mmol/L — ABNORMAL HIGH (ref 20.0–28.0)
Calcium, Ion: 1.28 mmol/L (ref 1.15–1.40)
HCT: 30 % — ABNORMAL LOW (ref 36.0–46.0)
Hemoglobin: 10.2 g/dL — ABNORMAL LOW (ref 12.0–15.0)
O2 Saturation: 98 %
Patient temperature: 98.4
Potassium: 3.8 mmol/L (ref 3.5–5.1)
Sodium: 144 mmol/L (ref 135–145)
TCO2: 30 mmol/L (ref 22–32)
pCO2 arterial: 43.5 mmHg (ref 32.0–48.0)
pH, Arterial: 7.426 (ref 7.350–7.450)
pO2, Arterial: 102 mmHg (ref 83.0–108.0)

## 2021-06-30 LAB — CBC
HCT: 39.7 % (ref 36.0–46.0)
HCT: 37.3 % (ref 36.0–46.0)
Hemoglobin: 11.3 g/dL — ABNORMAL LOW (ref 12.0–15.0)
Hemoglobin: 10.5 g/dL — ABNORMAL LOW (ref 12.0–15.0)
MCH: 24.7 pg — ABNORMAL LOW (ref 26.0–34.0)
MCH: 24.2 pg — ABNORMAL LOW (ref 26.0–34.0)
MCHC: 28.5 g/dL — ABNORMAL LOW (ref 30.0–36.0)
MCHC: 28.2 g/dL — ABNORMAL LOW (ref 30.0–36.0)
MCV: 86.9 fL (ref 80.0–100.0)
MCV: 85.9 fL (ref 80.0–100.0)
Platelets: 506 10*3/uL — ABNORMAL HIGH (ref 150–400)
Platelets: 420 10*3/uL — ABNORMAL HIGH (ref 150–400)
RBC: 4.57 MIL/uL (ref 3.87–5.11)
RBC: 4.34 MIL/uL (ref 3.87–5.11)
RDW: 17.3 % — ABNORMAL HIGH (ref 11.5–15.5)
RDW: 17.3 % — ABNORMAL HIGH (ref 11.5–15.5)
WBC: 11.6 10*3/uL — ABNORMAL HIGH (ref 4.0–10.5)
WBC: 12.3 10*3/uL — ABNORMAL HIGH (ref 4.0–10.5)
nRBC: 0.3 % — ABNORMAL HIGH (ref 0.0–0.2)
nRBC: 0.4 % — ABNORMAL HIGH (ref 0.0–0.2)

## 2021-06-30 LAB — BLOOD GAS, ARTERIAL
Acid-Base Excess: 3.2 mmol/L — ABNORMAL HIGH (ref 0.0–2.0)
Acid-Base Excess: 0.7 mmol/L (ref 0.0–2.0)
Bicarbonate: 27.9 mmol/L (ref 20.0–28.0)
Bicarbonate: 27.7 mmol/L (ref 20.0–28.0)
FIO2: 0.28
FIO2: 0.5
MECHVT: 420 mL
O2 Saturation: 94.5 %
O2 Saturation: 89.9 %
PEEP: 5 cmH2O
Patient temperature: 37
Patient temperature: 37
RATE: 20 resp/min
pCO2 arterial: 42 mmHg (ref 32.0–48.0)
pCO2 arterial: 55 mmHg — ABNORMAL HIGH (ref 32.0–48.0)
pH, Arterial: 7.43 (ref 7.350–7.450)
pH, Arterial: 7.31 — ABNORMAL LOW (ref 7.350–7.450)
pO2, Arterial: 71 mmHg — ABNORMAL LOW (ref 83.0–108.0)
pO2, Arterial: 64 mmHg — ABNORMAL LOW (ref 83.0–108.0)

## 2021-06-30 LAB — GLUCOSE, CAPILLARY
Glucose-Capillary: 219 mg/dL — ABNORMAL HIGH (ref 70–99)
Glucose-Capillary: 328 mg/dL — ABNORMAL HIGH (ref 70–99)
Glucose-Capillary: 290 mg/dL — ABNORMAL HIGH (ref 70–99)
Glucose-Capillary: 269 mg/dL — ABNORMAL HIGH (ref 70–99)
Glucose-Capillary: 282 mg/dL — ABNORMAL HIGH (ref 70–99)

## 2021-06-30 LAB — BASIC METABOLIC PANEL
Anion gap: 10 (ref 5–15)
BUN: 10 mg/dL (ref 6–20)
CO2: 24 mmol/L (ref 22–32)
Calcium: 9 mg/dL (ref 8.9–10.3)
Chloride: 105 mmol/L (ref 98–111)
Creatinine, Ser: 1.25 mg/dL — ABNORMAL HIGH (ref 0.44–1.00)
GFR, Estimated: 50 mL/min — ABNORMAL LOW (ref >60)
Glucose, Bld: 274 mg/dL — ABNORMAL HIGH (ref 70–99)
Potassium: 4.1 mmol/L (ref 3.5–5.1)
Sodium: 139 mmol/L (ref 135–145)

## 2021-06-30 LAB — CREATININE, SERUM
Creatinine, Ser: 0.91 mg/dL (ref 0.44–1.00)
GFR, Estimated: >60 mL/min (ref >60)

## 2021-06-30 MED ORDER — ETOMIDATE 2 MG/ML IV SOLN
20.0000 mg | Freq: Once | INTRAVENOUS | Status: AC
Start: 2021-06-30 — End: 2021-06-30
  Administered 2021-06-30: 20 mg via INTRAVENOUS

## 2021-06-30 MED ORDER — SODIUM CHLORIDE 0.9 % IV SOLN
3.0000 g | Freq: Four times a day (QID) | INTRAVENOUS | Status: AC
  Administered 2021-07-01 – 2021-07-04 (×16): 3 g via INTRAVENOUS
  Filled 2021-06-30 (×16): qty 8

## 2021-06-30 MED ORDER — MIDAZOLAM-SODIUM CHLORIDE 100-0.9 MG/100ML-% IV SOLN
0.0000 mg/h | INTRAVENOUS | Status: DC
  Administered 2021-07-01 (×2): 5 mg/h via INTRAVENOUS
  Filled 2021-06-30 (×2): qty 100

## 2021-06-30 MED ORDER — INSULIN ASPART 100 UNIT/ML IJ SOLN
0.0000 [IU] | INTRAMUSCULAR | Status: DC
Start: 2021-06-30 — End: 2021-06-30
  Administered 2021-06-30: 11 [IU] via SUBCUTANEOUS
  Filled 2021-06-30: qty 1

## 2021-06-30 MED ORDER — MIDAZOLAM-SODIUM CHLORIDE 100-0.9 MG/100ML-% IV SOLN
0.0000 mg/h | INTRAVENOUS | Status: DC
  Administered 2021-06-30: 2 mg/h via INTRAVENOUS
  Filled 2021-06-30: qty 100

## 2021-06-30 MED ORDER — POLYETHYLENE GLYCOL 3350 17 G PO PACK: 17.0000 g | PACK | Freq: Every day | ORAL | Status: DC

## 2021-06-30 MED ORDER — DOCUSATE SODIUM 100 MG PO CAPS
100.0000 mg | ORAL_CAPSULE | Freq: Two times a day (BID) | ORAL | Status: DC | PRN
  Administered 2021-07-15 – 2021-07-18 (×3): 100 mg via ORAL
  Filled 2021-06-30 (×3): qty 1

## 2021-06-30 MED ORDER — SODIUM CHLORIDE 0.9 % IV SOLN: 250.0000 mL | INTRAVENOUS | Status: DC

## 2021-06-30 MED ORDER — ASPIRIN EC 81 MG PO TBEC
81.0000 mg | DELAYED_RELEASE_TABLET | Freq: Every day | ORAL | Status: DC
  Filled 2021-06-30: qty 1

## 2021-06-30 MED ORDER — MIDAZOLAM BOLUS VIA INFUSION
0.0000 mg | INTRAVENOUS | Status: DC | PRN
  Filled 2021-06-30: qty 5

## 2021-06-30 MED ORDER — ROCURONIUM BROMIDE 50 MG/5ML IV SOLN
100.0000 mg | Freq: Once | INTRAVENOUS | Status: AC
  Administered 2021-06-30: 100 mg via INTRAVENOUS
  Filled 2021-06-30: qty 10

## 2021-06-30 MED ORDER — FENTANYL 2500MCG IN NS 250ML (10MCG/ML) PREMIX INFUSION
50.0000 ug/h | INTRAVENOUS | Status: DC
  Administered 2021-06-30: 50 ug/h via INTRAVENOUS
  Filled 2021-06-30: qty 250

## 2021-06-30 MED ORDER — DOCUSATE SODIUM 50 MG/5ML PO LIQD
100.0000 mg | Freq: Two times a day (BID) | ORAL | Status: DC
  Administered 2021-07-01 – 2021-07-05 (×8): 100 mg
  Filled 2021-06-30 (×8): qty 10

## 2021-06-30 MED ORDER — SODIUM CHLORIDE 0.9 % IV BOLUS
500.0000 mL | Freq: Once | INTRAVENOUS | Status: AC
  Administered 2021-06-30: 15:00:00 500 mL via INTRAVENOUS

## 2021-06-30 MED ORDER — LEVETIRACETAM IN NACL 500 MG/100ML IV SOLN
500.0000 mg | Freq: Two times a day (BID) | INTRAVENOUS | Status: DC
  Administered 2021-07-01 – 2021-07-03 (×6): 500 mg via INTRAVENOUS
  Filled 2021-06-30 (×6): qty 100

## 2021-06-30 MED ORDER — INSULIN ASPART 100 UNIT/ML IJ SOLN
0.0000 [IU] | INTRAMUSCULAR | Status: DC
  Administered 2021-07-01 (×2): 3 [IU] via SUBCUTANEOUS
  Administered 2021-07-01: 5 [IU] via SUBCUTANEOUS
  Administered 2021-07-01 (×2): 3 [IU] via SUBCUTANEOUS
  Administered 2021-07-01: 13:00:00 5 [IU] via SUBCUTANEOUS
  Administered 2021-07-02: 8 [IU] via SUBCUTANEOUS

## 2021-06-30 MED ORDER — FAMOTIDINE 20 MG PO TABS: 20.0000 mg | ORAL_TABLET | Freq: Two times a day (BID) | ORAL | Status: DC

## 2021-06-30 MED ORDER — FENTANYL BOLUS VIA INFUSION
50.0000 ug | INTRAVENOUS | Status: DC | PRN
  Filled 2021-06-30: qty 100

## 2021-06-30 MED ORDER — FENTANYL CITRATE PF 50 MCG/ML IJ SOSY: 50.0000 ug | PREFILLED_SYRINGE | Freq: Once | INTRAMUSCULAR | Status: DC

## 2021-06-30 MED ORDER — IOHEXOL 350 MG/ML SOLN
80.0000 mL | Freq: Once | INTRAVENOUS | Status: AC | PRN
  Administered 2021-06-30: 80 mL via INTRAVENOUS

## 2021-06-30 MED ORDER — SODIUM CHLORIDE 0.9 % IV SOLN
3.0000 g | Freq: Four times a day (QID) | INTRAVENOUS | Status: DC
  Administered 2021-06-30: 3 g via INTRAVENOUS
  Filled 2021-06-30: qty 3
  Filled 2021-06-30 (×4): qty 8

## 2021-06-30 MED ORDER — NALOXONE HCL 0.4 MG/ML IJ SOLN: 0.4000 mg | Freq: Once | INTRAMUSCULAR | Status: AC

## 2021-06-30 MED ORDER — NALOXONE HCL 0.4 MG/ML IJ SOLN
INTRAMUSCULAR | Status: AC
  Administered 2021-06-30: 15:00:00 0.4 mg
  Filled 2021-06-30: qty 1

## 2021-06-30 MED ORDER — LACOSAMIDE 200 MG/20ML IV SOLN
100.0000 mg | Freq: Two times a day (BID) | INTRAVENOUS | Status: DC
  Filled 2021-06-30 (×2): qty 10

## 2021-06-30 MED ORDER — INSULIN ASPART 100 UNIT/ML IJ SOLN
8.0000 [IU] | Freq: Three times a day (TID) | INTRAMUSCULAR | Status: DC
  Administered 2021-06-30: 8 [IU] via SUBCUTANEOUS
  Filled 2021-06-30: qty 1

## 2021-06-30 MED ORDER — POLYETHYLENE GLYCOL 3350 17 G PO PACK
17.0000 g | PACK | Freq: Every day | ORAL | Status: DC
  Administered 2021-07-01 – 2021-07-04 (×3): 17 g
  Filled 2021-06-30 (×4): qty 1

## 2021-06-30 MED ORDER — SODIUM CHLORIDE 0.9 % IV SOLN
4000.0000 mg | INTRAVENOUS | Status: AC
  Administered 2021-06-30: 4000 mg via INTRAVENOUS
  Filled 2021-06-30: qty 40

## 2021-06-30 MED ORDER — FENTANYL CITRATE PF 50 MCG/ML IJ SOSY
50.0000 ug | PREFILLED_SYRINGE | Freq: Once | INTRAMUSCULAR | Status: AC
  Administered 2021-06-30: 50 ug via INTRAVENOUS

## 2021-06-30 MED ORDER — DOCUSATE SODIUM 50 MG/5ML PO LIQD: 100.0000 mg | Freq: Two times a day (BID) | ORAL | Status: DC

## 2021-06-30 MED ORDER — MIDAZOLAM HCL 2 MG/2ML IJ SOLN
2.0000 mg | Freq: Once | INTRAMUSCULAR | Status: AC
  Administered 2021-06-30: 2 mg via INTRAVENOUS

## 2021-06-30 MED ORDER — LACOSAMIDE 200 MG/20ML IV SOLN
100.0000 mg | Freq: Two times a day (BID) | INTRAVENOUS | Status: DC
  Administered 2021-07-01 – 2021-07-06 (×13): 100 mg via INTRAVENOUS
  Filled 2021-06-30 (×15): qty 10

## 2021-06-30 MED ORDER — PANTOPRAZOLE SODIUM 40 MG IV SOLR: 40.0000 mg | Freq: Every day | INTRAVENOUS | Status: DC

## 2021-06-30 MED ORDER — FENTANYL CITRATE PF 50 MCG/ML IJ SOSY: 50.0000 ug | PREFILLED_SYRINGE | Freq: Once | INTRAMUSCULAR | Status: AC

## 2021-06-30 MED ORDER — GABAPENTIN 250 MG/5ML PO SOLN
600.0000 mg | Freq: Two times a day (BID) | ORAL | Status: DC
  Filled 2021-06-30: qty 12

## 2021-06-30 MED ORDER — FENTANYL 2500MCG IN NS 250ML (10MCG/ML) PREMIX INFUSION
50.0000 ug/h | INTRAVENOUS | Status: DC
  Administered 2021-07-01: 100 ug/h via INTRAVENOUS
  Filled 2021-06-30: qty 250

## 2021-06-30 MED ORDER — PHENYLEPHRINE HCL-NACL 20-0.9 MG/250ML-% IV SOLN
25.0000 ug/min | INTRAVENOUS | Status: DC
  Filled 2021-06-30: qty 250

## 2021-06-30 MED ORDER — PANTOPRAZOLE SODIUM 40 MG IV SOLR
40.0000 mg | Freq: Every day | INTRAVENOUS | Status: DC
  Administered 2021-07-01 – 2021-07-02 (×3): 40 mg via INTRAVENOUS
  Filled 2021-06-30 (×3): qty 40

## 2021-06-30 MED ORDER — HEPARIN SODIUM (PORCINE) 5000 UNIT/ML IJ SOLN
5000.0000 [IU] | Freq: Three times a day (TID) | INTRAMUSCULAR | Status: DC
  Administered 2021-07-01 – 2021-07-03 (×8): 5000 [IU] via SUBCUTANEOUS
  Filled 2021-06-30 (×8): qty 1

## 2021-06-30 MED ORDER — FENTANYL BOLUS VIA INFUSION
50.0000 ug | INTRAVENOUS | Status: DC | PRN
  Administered 2021-07-02: 100 ug via INTRAVENOUS
  Filled 2021-06-30: qty 100

## 2021-06-30 MED ORDER — POLYETHYLENE GLYCOL 3350 17 G PO PACK
17.0000 g | PACK | Freq: Every day | ORAL | Status: DC | PRN
  Administered 2021-07-16 – 2021-07-18 (×3): 17 g via ORAL
  Filled 2021-06-30 (×3): qty 1

## 2021-06-30 MED ORDER — INSULIN GLARGINE-YFGN 100 UNIT/ML ~~LOC~~ SOLN
30.0000 [IU] | Freq: Two times a day (BID) | SUBCUTANEOUS | Status: DC
  Administered 2021-06-30: 30 [IU] via SUBCUTANEOUS
  Filled 2021-06-30 (×3): qty 0.3

## 2021-06-30 NOTE — Progress Notes (Addendum)
OT Cancellation Note  Patient Details Name: Lauren Velazquez MRN: [id number] DOB: [birth year]   Cancelled Treatment:    Reason Eval/Treat Not Completed: Medical issues which prohibited therapy. Pt noted to have had rapid response this PM.   Addendum: Pt has transitioned to higher level care. Per therapy protocols, will require new orders or continue at transfer to initiate therapy services. OT to sign off at this time.  Matthew Folks, OTR/L [facility] [phone]

## 2021-06-30 NOTE — Procedures (Signed)
Intubation Procedure Note  Jayln Branscom  [id number]  1961/05/15  Date:06/30/21  Time:3:55 PM   Provider Performing:Tryniti Laatsch D Elvina Sidle    Procedure: Intubation (31500)  Indication(s) Respiratory Failure  Consent Unable to obtain consent due to emergent nature of procedure.   Anesthesia Etomidate, Fentanyl, and Rocuronium   Time Out Verified patient identification, verified procedure, site/side was marked, verified correct patient position, special equipment/implants available, medications/allergies/relevant history reviewed, required imaging and test results available.   Sterile Technique Usual hand hygeine, masks, and gloves were used   Procedure Description Patient positioned in bed supine.  Sedation given as noted above.  Patient was intubated with endotracheal tube using Glidescope.  View was Grade 1 full glottis .  Number of attempts was 1.  Colorimetric CO2 detector was consistent with tracheal placement.   Complications/Tolerance None; patient tolerated the procedure well. Chest X-ray is ordered to verify placement.   EBL Minimal   Specimen(s) None  Size 8.0 ETT Secured at 23 cm at lip.   Harlon Ditty, AGACNP-BC [facility] Prefer epic messenger for cross cover needs If after hours, please call E-link

## 2021-06-30 NOTE — Discharge Summary (Signed)
Physician Discharge Summary  Patient ID: Lauren Velazquez MRN: [id number] DOB/AGE: 1961-06-20 60 y.o.  Admit date: 06/19/2021 Discharge date: 06/30/2021   Brief Pt Description / Synopsis:  60 yo woman admitted with acute encephalopathy in the setting of suspected viral meningitis vs. serotonin syndrome vs. 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  Was successfully extubated.  On 06/30/21 became unresponsive, hypotensive, along with seizure like activity with concern for aspiration and inability to protect her airway requiring intubation.  Neurology recommends transfer for continuous EEG.  Discharge Diagnoses:   Acute Metabolic Encephalopathy Embolic Stroke Seizures Acute Hypoxic Respiratory Failure Concern for Aspiration Hypotension Diabetes Mellitus Type II                                                            Discharge Summary:  60 year old female presenting to [facility] ED from home via EMS for multiple complaints including stroke, chest pain, shortness of breath and pain all over. (Per ED documentation)   ED course: Per ED documentation upon arrival patient was agitated and extremely altered, exhibiting bizarre behavior.  She was screaming and yelling sticking her finger down her throat and attempt to make her self vomit.  While sitting in a recliner began attempting to flip it over eventually requiring physical restraint for her own safety.  ED staff attempted to sedate her as she was kicking and swinging arms at staff and husband.  Her main complaint to the ED provider was chest pain, she stated she was worried that she might have had a heart attack as well as feeling very dehydrated.  She also insisted on being given her chronic pain medication, however she also told staff she will take all her pills and overdose. Patient was placed under IVC.  Per ED documentation,  husband denied recreational drug use or any new medications (unclear if this comment was made to EDP or EMS).  While in the ED patient became febrile and sepsis protocol was initiated.  No definitive source of infection noted in CXR/UA, this in combination with severely altered mental status raise concern for meningitis.  Due to patient's increasingly agitated state refractory to benzodiazepines the decision was made by the EDP to mechanically intubate for airway protection in order to provide further sedation so that an LP could be performed safely  Medications given: 50 mg Benadryl, 5 mg Haldol, 2 mg Ativan x2, cefepime/vancomycin/acyclovir, 10 mg of Decadron, total 2.6 L IV fluid bolus, succinylcholine for RSI, 650 mg acetaminophen for fever Initial Vitals: Severely febrile at 103.9, tachypneic 32, tachycardic 134, hypertensive urgency 232/97 & 95% on room air EKG Interpretation: Date: 06/19/2021, EKG Time: 23:23, Rate: 121, Rhythm: Sinus tachycardia, QRS Axis: RAD, Intervals: Prolonged QTC, ST/T Wave abnormalities: None, Narrative Interpretation: Sinus tachycardia with RAD Chemistry: Na+: 134, K+: 3.9, BUN/Cr.:  9/0.76, Serum CO2/ AG: 28/9 Hematology: WBC: 14.4, Hgb: 12.2,  Normal troponin: 8> 15,  Lactic/ PCT: 4.5/pending, COVID-19 & Influenza A/B: negative ABG: 7.46/32/177/22.8 CXR 06/20/21: mild central vascular congestion CT head wo contrast 06/20/21: no acute intracranial abnormality   PCCM consulted for admission due to need for emergent intubation for airway protection and mechanical ventilatory support.   Please see [place] section for full detailed  history of patient's hospital course.   Discharge Plan by Diagnosis:   Acute Hypoxic Respiratory Failure secondary to Acute Encephalopathy, now with concern for Aspiration -S/p extubation 11/12, REINTUBATION 11/16 -Full vent support, implement lung protective strategies -Plateau pressures less than 30 cm H20 -Wean FiO2 &  PEEP as tolerated to maintain O2 sats >92% -Follow intermittent Chest X-ray & ABG as needed -Spontaneous Breathing Trials when respiratory parameters met and mental status permits -Implement VAP Bundle -Prn Bronchodilators -Obtain Tracheal aspirate -Start empiric Unasyn  Acute Metabolic Encephalopathy  Meningitis was ruled out, R/O Serotonin syndrome, manic episode Seizure Like Activity Sedation needs in setting of mechanical ventilation PMHx: Anxiety, chronic pain (w/ opioid dependence) -Maintain a RASS goal of 0 to -1 -Versed and Fentanyl as needed to maintain RASS goal -Avoid sedating medications as able -Daily wake up assessment -Seizure precautions  -Neurology following, appreciate input -Continue Vimpat -send to [facility] for continuous EEG monitoring, neuro aware  Acute bilateral punctate infarcts (embolic vs. hypotensive episode)   Etiology: embolic vs. hypotensive (80s-90s/50s-60s) & bradycardic episode overnight (0000-0100) may have lead to hypotensive findings on MRI (MRI at 9485). Normal echo, no evidence of AFIB on EKG/continuous monitor. Per neuro, likely embolic in etiology.  -Continue  ASA 64m daily + plavix 710mdaily x21 days f/b ASA 8128maily after that -MRAshowed <50% carotid stenosis bilat with no other hemodynamically significant stenoses  -TEE negative for thrombus or vegetation, per neuro will need 30 day outpatient loop recorder -statin per guidelines  -q4 hr neuro checks -STAT head CT for any change in neuro exam -PT/OT/SLP when patient able to participate  -Repeat CT Head and CTA > negative for acute stroke  Sepsis without septic shock due to suspected meningitis versus viral illness (resolved) Febrile illness? viral illness versus serotonin syndrome: Resolved Concern for Aspiration -Monitor fever curve -Trend WBC's & Procalcitonin -Follow cultures as above -IV antibiotics for meningitis coverage: ceftriaxone, vancomycin, ampicillin & acyclovir (all  discontinued)  -Start empiric Unasyn pending cultures & sensitivities   Hypertension Urgency ~ resolved Hypotension PMHx: HLD, HTN -Continuous cardiac monitoring -Maintain MAP >65 -IV fluids -Vasopressors as needed to maintain MAP goal -Trend lactic acid until normalized -Trend HS Troponin until peaked -Hold antihypertensives   Poorly controlled Type 2 Diabetes Mellitus -Hemoglobin A1C: 8.2 -CBG's q4h; Target range of 140 to 180 -SSI -Follow ICU Hypo/Hyperglycemia protocol        Significant Events:  06/20/21- Admit to ICU  with suspected meningitis s/p mechanical intubation for airway protection in the setting of increased sedation needs and agitation. S/p LP _0 :37 AM. ID consult. Patient started empirically on vancomycin, ceftriaxone, ampicillin and acyclovir.  ID consult. 06/21/21- MRI w/out contrast shows acute infarcts (embolic vs. Hypotensive). Neuro, and psych consulted.  TTE showed normal LVEF, grade I diastolic dysfunction, no sig valvular abnormalities and no intracardiac clot.  rEEG showed moderate diffuse slowing with subtle superimposed right focal slowing without epileptiform abnormalities 06/22/21-persistent agitation off of sedation.  Started Depakene for mood stabilization and cyproheptadine for potential serotonergic symptoms. 06/23/21: Remains on the vent and sedated. 06/24/21: Remains on the vent. Neuro eval recs central embolic suspected.  Start ASA 62m48mily + plavix 75mg91mly x21 days f/b ASA 62mg 21my after that.  Consider TEE 06/25/21: Remains on the vent and sedation.  Status post TEE negative for thrombus or vegetation. 06/26/21: S/p extubation now IVC with sitter at the bedside 06/27/21: Stable on [place] _1  with fluctuating mental status. Sitter at bedside 06/28/21: Restart home dose of  oxycodone for concern of possible withdrawal. EEG per neuro r/o seizure activity. Patient stable, able to be transferred to step down care. PCCM to sign sign 06/29/21:  Transferred to Med/surg 06/30/21: Rapid response called for unresponsiveness and hypotension.  Upon arrival to ICU concern for seizure activity.  Intubated for airway protection.  Neurology notified, likely needs transfer for continuous EEG                Significant Diagnostic Studies:  06/19/2021: CT head>>No acute intracranial abnormality. 06/20/2021: CT abdomen pelvis>>No evidence of inflammatory process or abscess.Cholelithiasis. No radiographic evidence of cholecystitis. Mild hepatic steatosis.Bilateral nephrolithiasis. No evidence of ureteral calculi or hydronephrosis. 5.4 cm benign-appearing left adnexal cyst. Recommend follow-up US in6-12 months. Note: This recommendation does not apply to premenarchal patients and to those with increased risk (genetic, family history, elevated tumor markers or other high-risk factors) of ovarian cancer. Reference: JACR 2020 Feb; 17(2):248-254 Aortic Atherosclerosis  06/21/2021: MRI brain>>1. Scattered punctate acute infarcts in both cerebral hemispheres (bilateral PCA and left MCA territories). No associated hemorrhage or mass effect. Although the posterior fossa and anterior cerebral artery territories are spared, the pattern is suspicious for a recent Embolic event. Hypotensive episode might also produce this appearance. 2. 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  06/24/2021: MRA head and neck>> MRA HEAD IMPRESSION: Negative intracranial MRA. No large vessel occlusion or hemodynamically significant stenosis. MRA NECK IMPRESSION: 1. 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 2. Wide patency of the left carotid artery system within the neck. 3. Widely patent vertebral arteries within the neck.         Micro Data:  11/6>> CSF- mild pleocytosis. Culture- no growth at 4 days. VDRL- negative.  11/6>> blood  cultures- negative (no growth _0 ) 11/6>> MRSA PCR - negative  11/6>> HSV1/2- negative  11/6>> Crypotococcal Ag - negative   Antimicrobials:  11/6 Vancomycin >>  11/6 Ampicillin >> 11/6 Ceftriaxone >> 11/6 Acyclovir >> 11/6 Cefepime >> x 1 dose  Consults:  PCCM Neurology Infectious Disease Psychiatry  Discharge Exam:   General: Critically ill-appearing female, laying in bed, unresponsive with inability to protect her airway Neuro: Unresponsive CV: Regular rate and rhythm, S1-S2, no murmurs, rubs, gallops PULM: Coarse rhonchi bilaterally, sonorous respirations GI: Obese, soft, nontender, nondistended, no guarding rebound tenderness, bowel sounds positive toward Extremities: No deformities  Vitals:   06/30/21 1700 06/30/21 1715 06/30/21 1730 06/30/21 1745  BP: (!) 104/55 (!) 151/69 115/64 128/64  Pulse: 90 96    Resp: (!) 26 (!) 30 16   Temp:      TempSrc:      SpO2: 98% 98%    Weight:      Height:         Discharge Labs:   BMET Recent Labs  Lab 06/26/21 0403 06/27/21 0507 06/28/21 0455 06/29/21 0356 06/30/21 1551  NA 144 141 140 144 139  K 4.3 3.2* 3.5 3.3* 4.1  CL 108 102 103 105 105  CO2 _1 GLUCOSE 193* 98 82 190* 274*  BUN _2 CREATININE 0.43* 0.53 0.48 0.45 1.25*  CALCIUM 8.6* 8.6* 8.6* 9.2 9.0  MG 2.4 2.1 2.3 2.4  --   PHOS 3.0 2.8 3.0 2.9  --     CBC Recent Labs  Lab 06/28/21 0455 06/29/21 0356 06/30/21 1551  HGB 8.8* 11.7* 11.3*  HCT 29.7* 41.0 39.7  WBC 11.8* 13.5* 11.6*  PLT 399 584* 506*    Anti-Coagulation No results for input(s): INR in the last 168 hours.        Allergies as of 06/30/2021       Reactions   Atorvastatin Swelling        Medication List     STOP taking these medications    diclofenac sodium 1 % Gel Commonly known as: VOLTAREN   ferrous gluconate 324 MG tablet Commonly known as: FERGON   Fluticasone-Umeclidin-Vilant 100-62.5-25 MCG/ACT Aepb   gabapentin 300 MG  capsule Commonly known as: NEURONTIN   insulin aspart 100 UNIT/ML injection Commonly known as: novoLOG   Lantus SoloStar 100 UNIT/ML Solostar Pen Generic drug: insulin glargine   lisinopril 40 MG tablet Commonly known as: ZESTRIL   oxyCODONE-acetaminophen 10-325 MG tablet Commonly known as: PERCOCET   traZODone 100 MG tablet Commonly known as: DESYREL   venlafaxine XR 75 MG 24 hr capsule Commonly known as: EFFEXOR-XR   Ventolin HFA 108 (90 Base) MCG/ACT inhaler Generic drug: albuterol            Disposition: Neuro ICU  Discharged Condition: Shailynn Fong has met maximum benefit of inpatient care at [facility] and requires transfer to tertiary care center for continuous EEG.   Time spent on disposition:  50 Minutes.     Signed: Roselie Awkward, MD [facility] Pager: [phone] Cell: [phone] After 7:00 pm call Elink  [phone]

## 2021-06-30 NOTE — Consult Note (Addendum)
NEURO HOSPITALIST CONSULT NOTE   Requestig physician: Dr. Duwayne Heck  Reason for Consult: Unresponsiveness. Possible subclinical seizures  History obtained from:   Chart     HPI:                                                                                                                                          Lauren Velazquez is an 60 y.o. female with a PMHx of DM2, HTN, vertigo, AMS who initially presented to [facility] on 11/5 with acute encephalopathy in the context of hypoxic respiratory failure and inability to protect her airway requiring intubation. During her initial work up at [facility], imaging revealed multifocal acute ischemic strokes. She had bizarre behavior in the ED and was screaming and sticking her finger down her throat trying to make herself vomit. She was initially afebrile but later Tmax was 104. In ED WBC 15.4, plts 507. Utox negative although patient was prescribed opiates o/p. CTH in the [facility] ED NAICP. She became increasingly agitated and required LP in the [facility] ED on 11/6 to r/o meningitis. LP showed WBC 22 w/ 9% neutrophils, lymph 23%, monocytes 63%, RBC 317, protein 45. ID consulted and she was covered with meningitis with vanc, ceftriaxone, ampicillin, and acyclovir. Acyclovir was discontinued after HSV PCR returned negative. Since then, her whole meningitis/infectious work up has come back negative.   MRI brain at [facility] on 11/7 performed w/wo contrast showed scattered punctate acute infarcts in bilateral cerebral hemispheres with no large areas of infarction, no mass effect, and no hemorrhage. TTE 11/7 showed normal LVEF, grade I diastolic dysfunction, no sig valvular abnormalities and no intracardiac clot.  MRA head on 11/10 was negative. MRA neck showed atheromatous narrowing of up to 40-50% by NASCET criteria involving the right carotid bulb/proximal right ICA. There was wide patency of the left carotid artery system within the neck, as  well as widely patent  vertebral arteries within the neck.  Routine EEG 11/7 showed moderate diffuse slowing with subtle superimposed right focal slowing without epileptiform abnormalities.  She subsequently was unable to wean from vent 2/2 persistent encephalopathy, but later improved and was extubated.   Today, she was continuing to get better on the floor, when she had sudden onset of unresponsiveness and hypotension requiring rapid response and ICU eval with reintubation. Reportedly had posturing and frothing from mouth. CTH negative, CTA with no LVO or basilar occlusion. Given the above semiology, there was concern for seizure followed by postictal state versus status epilepticus. She was loaded with Keppra. Already had been on Vimpat 100 mg BID.   She was transferred to [facility] for ICU admission and 24 hour EEG.   Past Medical History:  Diagnosis Date   Anemia    not currently under treatment   Anxiety  Asthma    during allergy season   Diabetes mellitus without complication ([place])    Hypertension    Vertigo     Past Surgical History:  Procedure Laterality Date   CARPAL TUNNEL RELEASE Right 2015   KNEE ARTHROSCOPY Left 05/17/2018   Procedure: ARTHROSCOPY KNEE WITH LATERAL RELEASE, PARTIAL SYNOVECTOMY;  Surgeon: Hessie Knows, MD;  Location: [facility] ORS;  Service: Orthopedics;  Laterality: Left;   KNEE ARTHROSCOPY WITH LATERAL RELEASE Right 12/28/2017   Procedure: KNEE ARTHROSCOPY WITH LATERAL RELEASE;  Surgeon: Hessie Knows, MD;  Location: [facility] ORS;  Service: Orthopedics;  Laterality: Right;   TIBIAL TUBERCLERPLASTY Bilateral 06/01/2017   Procedure: TIBIAL TUBERCLE SPUR EXCISION;  Surgeon: Hessie Knows, MD;  Location: [facility] ORS;  Service: Orthopedics;  Laterality: Bilateral;    History reviewed. No pertinent family history.         Social History:  reports that she has never smoked. She has never used smokeless tobacco. She reports that she does not drink alcohol and does not use drugs.  Allergies   Allergen Reactions   Atorvastatin Swelling    MEDICATIONS:                                                                                                                     Scheduled:  [START ON 07/01/2021] aspirin EC  81 mg Oral Daily   docusate  100 mg Per Tube BID   fentaNYL (SUBLIMAZE) injection  50 mcg Intravenous Once   heparin  5,000 Units Subcutaneous Q8H   [START ON 07/01/2021] insulin aspart  0-15 Units Subcutaneous Q4H   pantoprazole (PROTONIX) IV  40 mg Intravenous QHS   [START ON 07/01/2021] polyethylene glycol  17 g Per Tube Daily   Continuous:  fentaNYL infusion INTRAVENOUS     lacosamide (VIMPAT) IV     [START ON 07/01/2021] levETIRAcetam     midazolam       ROS:                                                                                                                                       Unable to obtain due to unresponsiveness.    Blood pressure 134/79, pulse 80, temperature 98.4 F (36.9 C), temperature source Axillary, resp. rate 18, height 5\' 1"  (1.549 m), weight 100.7 kg, last menstrual period 08/15/2008, SpO2 95 %.   General Examination:  Physical Exam  HEENT-  [place]/AT. Neck supple.    Lungs- Intubated Extremities- Warm and well perfused   Neurological Examination Mental Status: Intubated and sedated on low dose Versed at a rate of 4 and fentanyl at a rate of 100. Furrows brow and moves LUE towards chest with sternal rub. Does not open eyes to any stimuli. No attempts to communicate. No response to name or verbal commands.   Cranial Nerves: II: Pupils sluggishly reactive 3 >> 2 mm bilaterally. No blink to threat.   III,IV, VI: No doll's eye reflex. Eyes conjugately at the midline.  V,VII: Weak corneal reflexes bilaterally VIII: No response to auditory stimuli IX,X: Intubated XI: Head is midline XII: Intubated Motor: Flaccid tone x  4 Moves LUE towards chest in response to sternal rub, 2/5.  Slight movement of RUE to sternal rub 1-2/5.  Moves toes, dorsiflexes at ankles and minimally flexes at knees in response to noxious plantar stimulation. No asymmetry of BLE responses noted.  No jerking or twitching noted.  No limb posturing.  Sensory:  Slight movement of upper extremities to noxious.  Moves BLE slightly to thigh pinch.  Deep Tendon Reflexes: 3+ bilateral brachioradialis, biceps and patellae. 2+ achilles bilaterally.  Plantars: Right: downgoing   Left: downgoing Cerebellar/Gait: Unable to assess    Lab Results: Basic Metabolic Panel: Recent Labs  Lab 06/26/21 0403 06/27/21 0507 06/28/21 0455 06/29/21 0356 06/30/21 1551  NA 144 141 140 144 139  K 4.3 3.2* 3.5 3.3* 4.1  CL 108 102 103 105 105  CO2 30 31 28 27 24   GLUCOSE 193* 98 82 190* 274*  BUN 12 10 10 7 10   CREATININE 0.43* 0.53 0.48 0.45 1.25*  CALCIUM 8.6* 8.6* 8.6* 9.2 9.0  MG 2.4 2.1 2.3 2.4  --   PHOS 3.0 2.8 3.0 2.9  --     CBC: Recent Labs  Lab 06/26/21 0403 06/27/21 0507 06/28/21 0455 06/29/21 0356 06/30/21 1551  WBC 7.7 13.3* 11.8* 13.5* 11.6*  HGB 8.8* 8.8* 8.8* 11.7* 11.3*  HCT 29.7* 29.5* 29.7* 41.0 39.7  MCV 84.1 81.0 82.3 83.5 86.9  PLT 335 372 399 584* 506*    Cardiac Enzymes: No results for input(s): CKTOTAL, CKMB, CKMBINDEX, TROPONINI in the last 168 hours.  Lipid Panel: Recent Labs  Lab 06/24/21 0447 06/25/21 1035  CHOL  --  197  TRIG 146 189*  HDL  --  26*  CHOLHDL  --  7.6  VLDL  --  38  LDLCALC  --  13/10/22*    Imaging: CT ANGIO HEAD NECK W WO CM  Result Date: 06/30/2021 CLINICAL DATA:  Neuro deficit, stroke suspected EXAM: CT ANGIOGRAPHY HEAD AND NECK TECHNIQUE: Multidetector CT imaging of the head and neck was performed using the standard protocol during bolus administration of intravenous contrast. Multiplanar CT image reconstructions and MIPs were obtained to evaluate the vascular anatomy. Carotid  stenosis measurements (when applicable) are obtained utilizing NASCET criteria, using the distal internal carotid diameter as the denominator. CONTRAST:  74mL OMNIPAQUE IOHEXOL 350 MG/ML SOLN COMPARISON:  06/19/2021 FINDINGS: CT HEAD FINDINGS Brain: No evidence of acute infarction, hemorrhage, cerebral edema, mass, mass effect, or midline shift. No hydrocephalus or extra-axial fluid collection. The previously noted infarcts on the 06/21/2021 MRI are not visualized on the CT. Vascular: No hyperdense vessel. Skull: Normal. Negative for fracture or focal lesion. Sinuses/Orbits: Mild mucosal thickening right maxillary sinus and ethmoid air cells. Other: The mastoid air cells are well aerated. Review of the MIP  images confirms the above findings CTA NECK FINDINGS Aortic arch: Standard branching. Imaged portion shows no evidence of aneurysm or dissection. No significant stenosis of the major arch vessel origins. Right carotid system: No evidence of dissection, stenosis (50% or greater) or occlusion. Left carotid system: No evidence of dissection, stenosis (50% or greater) or occlusion. Vertebral arteries: Codominant. No evidence of dissection, stenosis (50% or greater) or occlusion. Skeleton: No acute osseous abnormality. Other neck: Patient is intubated and has an orogastric tube. Upper chest: Right upper lobe opacity, possibly atelectatic lung. Linear atelectasis in the left upper lobe. Review of the MIP images confirms the above findings CTA HEAD FINDINGS Anterior circulation: Both internal carotid arteries are patent to the termini, without stenosis or other abnormality. A1 segments patent. Normal anterior communicating artery. Anterior cerebral arteries are patent to their distal aspects. No M1 stenosis or occlusion. Normal MCA bifurcations. Distal MCA branches perfused and symmetric. Posterior circulation: Vertebral arteries patent to the vertebrobasilar junction without stenosis. Posterior inferior cerebral  arteries patent bilaterally. Basilar patent to its distal aspect. Superior cerebellar arteries patent bilaterally. Near fetal origins of the bilateral PCAs, with patent bilateral posterior communicating arteries. PCAs perfused to their distal aspects without stenosis. Venous sinuses: As permitted by contrast timing, patent. Anatomic variants: None significant Review of the MIP images confirms the above findings IMPRESSION: 1. No acute intracranial process. 2. No hemodynamically significant stenosis in the neck. 3. No intracranial large vessel occlusion or significant stenosis. 4. Right upper lung opacity, possibly atelectatic lung. These results were called by telephone at the time of interpretation on 06/30/2021 at 5:40 pm to provider [facility] , who verbally acknowledged these results. Electronically Signed   By: Merilyn Baba M.D.   On: 06/30/2021 17:47   DG Abd 1 View  Result Date: 06/30/2021 CLINICAL DATA:  Check gastric tube placement EXAM: ABDOMEN - 1 VIEW COMPARISON:  06/21/2021 FINDINGS: Gastric catheter is noted within the distal stomach directed towards the pylorus. No free air is seen. No obstructive changes are noted. IMPRESSION: Gastric catheter in the distal stomach. Electronically Signed   By: Inez Catalina M.D.   On: 06/30/2021 15:43   DG Chest Port 1 View  Result Date: 06/30/2021 CLINICAL DATA:  Check endotracheal tube placement EXAM: PORTABLE CHEST 1 VIEW COMPARISON:  06/20/2021 FINDINGS: Endotracheal tube is noted at the level of the carina directed towards right mainstem bronchus. This should be withdrawn approximately 2 cm. Gastric catheter extends into the stomach. Cardiac shadow is enlarged in size but stable. Mild central vascular congestion is noted. Some patchy airspace opacity is noted in the left mid lung which may be related to edema. No bony abnormality is seen. Right jugular catheter is been removed. IMPRESSION: Tubes and lines as described above. The endotracheal tube  should be withdrawn approximately 2 cm. Vascular congestion with mild airspace opacity on the left likely representing edema. Electronically Signed   By: Inez Catalina M.D.   On: 06/30/2021 15:43     Assessment: 60 year old female with was initially admitted to [facility] for hypoxic respiratory failure requiring intubation, AMS, stroke and seizure. She acutely became unresponsive at [facility]. No clinical seizure activity on exam. Unresponsive with no eye opening, no attempts to communicate and not following commands. Moves LUE > RUE semipurposefully to noxious. Weak withdrawal of BLE to noxious. No neck stiffness.  2. STAT CTA to assess for possible LVO at [facility] today was negative.   Recommendations: 1. STAT LTM EEG has been ordered  and technician notified.  2. Continue Vimpat 100 mg IV BID and Keppra 500 mg IV BID.  3. Continue Versed gtt at low rate for now.  4. Vent management per CCM.   45 minutes spent in the emergent neurological evaluation and management of this critically ill patient.   Addendum: STAT EEG shows no electrographic seizure   Electronically signed: Dr. Kerney Elbe 06/30/2021, 11:02 PM

## 2021-06-30 NOTE — Progress Notes (Signed)
Neurology progress note-RECALL  S:// Neurology service recalled for reevaluation. Patient had a sudden onset of unresponsiveness.  She appeared cold and clammy to the rapid response team.  She was hypotensive with systolic blood pressures in the 50s with reactive pupils bilaterally and reacting to noxious stimulation on the legs.  Her sats remained at 99% on the monitor.  ABG looks stable.  She received 1 dose of Narcan and was emergently transported to the ICU and emergently intubated for airway protection.  She was noted to have posturing movements along with foaming from her mouth. She was intubated after giving her rocuronium Currently on Vimpat as AED.  CBC    Component Value Date/Time   WBC 13.5 (H) 06/29/2021 0356   RBC 4.91 06/29/2021 0356   HGB 11.7 (L) 06/29/2021 0356   HGB 10.1 (L) 11/19/2014 2136   HCT 41.0 06/29/2021 0356   HCT 33.3 (L) 11/19/2014 2136   PLT 584 (H) 06/29/2021 0356   PLT 506 (H) 11/19/2014 2136   MCV 83.5 06/29/2021 0356   MCV 74 (L) 11/19/2014 2136   MCH 23.8 (L) 06/29/2021 0356   MCHC 28.5 (L) 06/29/2021 0356   RDW 16.9 (H) 06/29/2021 0356   RDW 16.8 (H) 11/19/2014 2136   LYMPHSABS 1.1 06/19/2021 1722   LYMPHSABS 1.5 11/19/2014 2136   MONOABS 0.4 06/19/2021 1722   MONOABS 0.5 11/19/2014 2136   EOSABS 0.0 06/19/2021 1722   EOSABS 0.0 11/19/2014 2136   BASOSABS 0.1 06/19/2021 1722   BASOSABS 0.0 11/19/2014 2136   BMP Latest Ref Rng & Units 06/29/2021 06/28/2021 06/27/2021  Glucose 70 - 99 mg/dL 202(R) 82 98  BUN 6 - 20 mg/dL 7 10 10   Creatinine 0.44 - 1.00 mg/dL 4.27 0.62  Sodium 135 - 145 mmol/L 144 140 141  Potassium 3.5 - 5.1 mmol/L 3.3(L) 3.5 3.2(L)  Chloride 98 - 111 mmol/L 105 103 102  CO2 22 - 32 mmol/L 27 28 31   Calcium 8.9 - 10.3 mg/dL 9.2 3.76) )     O: Vitals:   06/30/21 1138 06/30/21 1518  BP: (!) 99/56   Pulse: 98   Resp: 18   Temp: 98.1 F (36.7 C)   SpO2: 100% 99%     Physical exam: General: Intubated  sedated, recently given rocuronium for intubation. HEENT: Normocephalic/atraumatic CVS: Regular rate rhythm Respiratory: Vented Neurological exam-limited by sedation and paralytics. Intubated sedated No spontaneous movement No movement to noxious stimulation Pupils equal and sluggishly reactive-corneals present, breathing with the ventilator.  Assessment:// 60 year old woman past history significant for diabetes, hypertension, vertigo, altered mental status admitted with acute encephalopathy requiring intubation for airway protection few days ago and neurology was consulted.  No seizures seen on EEG.  MRI brain revealed multifocal acute ischemic infarcts favored to be central embolic although work-up remained negative including TEE which was negative for any vegetations. CSF analysis revealed 22 and 18 white cells in tube 4 and 1.  Protein 45.  HSV PCR negative, microbiology negative.  Initially on antibiotics/antiviral coverage which was DC'd after the negative results. She was transferred to the floor from the ICU.  She worked with physical therapy this morning and was sitting in chair and was intermittently drowsy when the hospitalist saw her but sometime this afternoon had an episode of complete unresponsiveness, was cold clammy and extremely hypotensive requiring emergent intubation and fluid resuscitation. There was noted posturing as well as frothing from the mouth raising suspicion for seizure activity. Continuous EEG was considered but not performed  due to her mental status improving by my outgoing colleague last week but at this point, I think she would require continuous EEG and repeat brain imaging for further management. Other consideration is basilar occlusion that can present with posturing etc. given she has recently had strokes. No focality on exam was noted prior to intubation, hence a code stroke was not called but I think it is worthwhile to r/o a basilar thrombus. Team also  concerned about serotonin syndrome-she was on trazodone which have discontinued.  Impression -Evaluate for basilar occlusion - Evaluate for status epilepticus - Toxic metabolic encephalopathy - Question of serotonin syndrome   Recommendations  -Stat CT head and stat CTA head and neck -She is on Vimpat 100 twice daily monotherapy.  Given her hypotension although there was no cardiac arrest, I would be ginger about raising the dose of Vimpat. -I would load her with 4000 mg of Keppra and started on Keppra 500 mg twice daily. -Discontinue trazodone -Minimize sedating medications as tolerated -Continuous EEG when at [facility]I will touch base with my team at [facility] to try to get the patient transferred over to the critical care service for continuous EEG.  Plan d/w family and Dr.McQuaid of the PCCM team in the unit   ADDENDUM [facility] and CTA Head Neck neg for LVO incl no evidence of basilar occlusion. Bilateral fetal PCAs. Imaging reviewed personally and preliminarily d/w on call neurorads. Plan to transfer to [facility] for cEEG which is not available at [facility]. I have discussed with on call neurohospitalist at [facility] as well. Final recs relayed to Dr. Kendrick Fries over the phone.  CRITICAL CARE ATTESTATION Performed by: Milon Dikes, MD Total critical care time: 50 minutes Critical care time was exclusive of separately billable procedures and treating other patients and/or supervising APPs/Residents/Students Critical care was necessary to treat or prevent imminent or life-threatening deterioration due to acute toxic metabolic encephaopathy, concern for stroke versus status epilepticus This patient is critically ill and at significant risk for neurological worsening and/or death and care requires constant monitoring. Critical care was time spent personally by me on the following activities: development of treatment plan with patient and/or surrogate as well as nursing, discussions with  consultants, evaluation of patient's response to treatment, examination of patient, obtaining history from patient or surrogate, ordering and performing treatments and interventions, ordering and review of laboratory studies, ordering and review of radiographic studies, pulse oximetry, re-evaluation of patient's condition, participation in multidisciplinary rounds and medical decision making of high complexity in the care of this patient.

## 2021-06-30 NOTE — Progress Notes (Signed)
Pharmacy Antibiotic Note  Lauren Velazquez is a 60 y.o. female admitted on 06/30/2021. Rapid response called 11/16 requiring transfer to the ICU and intubation. Concern for aspiration. Pharmacy has been consulted for Unasyn dosing . Pt received one dose at [facility] prior to transfer.  Plan: Unasyn 3 g IV q6hr Will f/u renal function, micro data, and pt's clinical condition  Height: 5\' 1"  (154.9 cm) Weight: 100.7 kg (222 lb 0.1 oz) IBW/kg (Calculated) : 47.8  Temp (24hrs), Avg:98.1 F (36.7 C), Min:97.2 F (36.2 C), Max:98.6 F (37 C)  Recent Labs  Lab 06/26/21 0403 06/27/21 0507 06/28/21 0455 06/29/21 0356 06/30/21 1551  WBC 7.7 13.3* 11.8* 13.5* 11.6*  CREATININE 0.43* 0.53 0.48 0.45 1.25*     Estimated Creatinine Clearance: 53.4 mL/min (A) (by C-G formula based on SCr of 1.25 mg/dL (H)).    Allergies  Allergen Reactions   Atorvastatin Swelling    Antimicrobials this admission: Unasyn 11/16 >>  Microbiology results: 11/16 Trach aspirate [facility]): pending 11/5 BCx [facility]): neg   Thank you for allowing pharmacy to be a part of this patient's care.  [facility], PharmD, BCPS Please see amion for complete clinical pharmacist phone list 06/30/2021 11:48 PM

## 2021-06-30 NOTE — Progress Notes (Signed)
Chaplain Maggie met pt's spouse in the hallway outside of the room during Rapid Response. Hospitality, silent prayer, and ministry of presence was offered to family. Chaplain ushered pt's daughter and husband to ICU waiting room when pt was transferred to [place]. Continued support available per on call chaplain.

## 2021-06-30 NOTE — Consult Note (Signed)
NAME:  Lauren Velazquez, MRN:  [id number], DOB:  [birth year], LOS: 29 ADMISSION DATE:  06/19/2021, INITIAL CONSULTATION DATE:  06/19/2021 REFERRING MD:  Dr. Leonides Schanz Chrisitine, CHIEF COMPLAINT:  Altered Mental Status   Brief Patient Description  60 year old female presenting to [facility] ED from home via EMS for multiple complaints including stroke, chest pain, shortness of breath and pain all over. (Per ED documentation)   ED course: Per ED documentation upon arrival patient was agitated and extremely altered, exhibiting bizarre behavior.  She was screaming and yelling sticking her finger down her throat and attempt to make her self vomit.  While sitting in a recliner began attempting to flip it over eventually requiring physical restraint for her own safety.  ED staff attempted to sedate her as she was kicking and swinging arms at staff and husband.  Her main complaint to the ED provider was chest pain, she stated she was worried that she might have had a heart attack as well as feeling very dehydrated.  She also insisted on being given her chronic pain medication, however she also told staff she will take all her pills and overdose. Patient was placed under IVC.  Per ED documentation, husband denied recreational drug use or any new medications (unclear if this comment was made to EDP or EMS).  While in the ED patient became febrile and sepsis protocol was initiated.  No definitive source of infection noted in CXR/UA, this in combination with severely altered mental status raise concern for meningitis.  Due to patient's increasingly agitated state refractory to benzodiazepines the decision was made by the EDP to mechanically intubate for airway protection in order to provide further sedation so that an LP could be performed safely  Medications given: 50 mg Benadryl, 5 mg Haldol, 2 mg Ativan x2, cefepime/vancomycin/acyclovir, 10 mg of Decadron, total 2.6 L IV fluid bolus, succinylcholine for RSI, 650 mg acetaminophen  for fever Initial Vitals: Severely febrile at 103.9, tachypneic 32, tachycardic 134, hypertensive urgency 232/97 & 95% on room air EKG Interpretation: Date: 06/19/2021, EKG Time: 23:23, Rate: 121, Rhythm: Sinus tachycardia, QRS Axis: RAD, Intervals: Prolonged QTC, ST/T Wave abnormalities: None, Narrative Interpretation: Sinus tachycardia with RAD Chemistry: Na+: 134, K+: 3.9, BUN/Cr.:  9/0.76, Serum CO2/ AG: 28/9 Hematology: WBC: 14.4, Hgb: 12.2,  Normal troponin: 8> 15,  Lactic/ PCT: 4.5/pending, COVID-19 & Influenza A/B: negative ABG: 7.46/32/177/22.8 CXR 06/20/21: mild central vascular congestion CT head wo contrast 06/20/21: no acute intracranial abnormality   PCCM consulted for admission due to need for emergent intubation for airway protection and mechanical ventilatory support.  Please see [place] section for full detailed history of patient's hospital course.  Pertinent  Medical History  T2DM HTN Vertigo HLD Anxiety Chronic Back pain COVID-19   Significant Hospital Events: Including procedures, antibiotic start and stop dates in addition to other pertinent events   06/20/21- Admit to ICU  with suspected meningitis s/p mechanical intubation for airway protection in the setting of increased sedation needs and agitation. S/p LP _0 :37 AM. ID consult. Patient started empirically on vancomycin, ceftriaxone, ampicillin and acyclovir.  ID consult. 06/21/21- MRI w/out contrast shows acute infarcts (embolic vs. Hypotensive). Neuro, and psych consulted.  TTE showed normal LVEF, grade I diastolic dysfunction, no sig valvular abnormalities and no intracardiac clot.  rEEG showed moderate diffuse slowing with subtle superimposed right focal slowing without epileptiform abnormalities 06/22/21-persistent agitation off of sedation.  Started Depakene for mood stabilization and cyproheptadine for potential serotonergic symptoms. 06/23/21: Remains on the vent  and sedated. 06/24/21:  Remains on the vent. Neuro eval recs central embolic suspected.  Start ASA 58m daily + plavix 745mdaily x21 days f/b ASA 8139maily after that.  Consider TEE 06/25/21: Remains on the vent and sedation.  Status post TEE negative for thrombus or vegetation. 06/26/21: S/p extubation now IVC with sitter at the bedside 06/27/21: Stable on [place] _0  with fluctuating mental status. Sitter at bedside 06/28/21: Restart home dose of oxycodone for concern of possible withdrawal. EEG per neuro r/o seizure activity. Patient stable, able to be transferred to step down care. PCCM to sign sign 06/29/21: Transferred to Med/surg 06/30/21: Rapid response called for unresponsiveness and hypotension.  Upon arrival to ICU concern for seizure activity.  Intubated for airway protection.  Neurology notified, likely needs transfer for continuous EEG  Cultures:  11/6>> CSF- mild pleocytosis. Culture- no growth at 4 days. VDRL- negative.  11/6>> blood cultures- negative (no growth _1 ) 11/6>> MRSA PCR - negative  11/6>> HSV1/2- negative  11/6>> Crypotococcal Ag - negative   11/16 resp culture >   Antimicrobials:  11/6 Vancomycin >>  11/6 Ampicillin >> 11/6 Ceftriaxone >> 11/6 Acyclovir >> 11/6 Cefepime >> x 1 dose  11/16 unasyn >   Interim History / Subjective:  -Rapid response called for unresponsiveness and hypotension (SBP 50's) ~ received 500 cc NS bolus with improvement in BP ~ no response to Narcan -Transfer back to ICU ~ Upon arrival to ICU concern for seizure activity -Intubated for airway protection.  -Neurology notified, likely needs transfer for continuous EEG  OBJECTIVE   Blood pressure (!) 99/56, pulse 98, temperature 98.1 F (36.7 C), temperature source Oral, resp. rate 18, height _2  (1.549 m), weight 104.8 kg, last menstrual period 08/15/2008, SpO2 99 %.    Vent Mode: PRVC FiO2 (%):  [50 %] 50 % Set Rate:  [20 bmp] 20 bmp Vt Set:  [420 mL] 420 mL PEEP:  [5 cmH20] 5 cmH20    Intake/Output Summary (Last 24 hours) at 06/30/2021 1558 Last data filed at 06/30/2021 0100 Gross per 24 hour  Intake 150 ml  Output 1150 ml  Net -1000 ml    Filed Weights   06/26/21 0449 06/27/21 0500 06/28/21 0439  Weight: 108.9 kg 109 kg 104.8 kg   Physical Examination: General:  increased work of breathing, in bed HENT: NCAT OP clear PULM: Rhonchi R > L B, normal effort CV: RRR, no mgr GI: BS+, soft, nontender MSK: normal bulk and tone Neuro: sonorous, unresponsive to voice, touch   Labs/imaging that I havepersonally reviewed  (right click and "Reselect all SmartList Selections" daily)  CXR 06/20/21: mild central vascular congestion CT head wo contrast 06/20/21: no acute intracranial abnormality CT abd/pelvis 06/20/21: No evidence of inflammatory process or abscess. Evidence of cholelithiasis, bilateral nephrolithiasis, 5.4 cm benign-appearing left adnexal mass.   CSF 06/20/21: mild pleocytosis MR brain w/out 06/21/21: Scattered punctate acute infarcts in both cerebral hemispheres (bilateral PCA and left MCA territories). No associated hemorrhage or mass effect. Although the posterior fossa and anterior cerebral artery territories are spared, the pattern is suspicious for a recent embolic event. Hypotensive episode might also produce this appearance. 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  Echo 06/21/21: EF 60-65%, otherwise normal.  EEG 06/21/21: moderate diffuse slowing, focal right sided slowing, no epileptiform abnormalities.  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  Wide patency of the left carotid artery system within the neck. Widely patent vertebral arteries within the neck. TEE 06/25/2021: negative for thrombus or vegetation   Labs   CBC: Recent Labs  Lab 06/24/21 1122 06/26/21 0403 06/27/21 0507  06/28/21 0455 06/29/21 0356  WBC 8.1 7.7 13.3* 11.8* 13.5*  HGB 8.6* 8.8* 8.8* 8.8* 11.7*  HCT 29.1* 29.7* 29.5* 29.7* 41.0  MCV 84.1 84.1 81.0 82.3 83.5  PLT 328 335 372 399 584*     Basic Metabolic Panel: Recent Labs  Lab 06/25/21 0425 06/26/21 0403 06/27/21 0507 06/28/21 0455 06/29/21 0356  NA 143 144 141 140 144  K 3.4* 4.3 3.2* 3.5 3.3*  CL 108 108 102 103 105  CO2 _0 GLUCOSE 143* 193* 98 82 190*  BUN _1 CREATININE 0.35* 0.43* 0.53 0.48 0.45  CALCIUM 8.2* 8.6* 8.6* 8.6* 9.2  MG  --  2.4 2.1 2.3 2.4  PHOS  --  3.0 2.8 3.0 2.9    GFR: Estimated Creatinine Clearance: 85.4 mL/min (by C-G formula based on SCr of 0.45 mg/dL). Recent Labs  Lab 06/26/21 0403 06/27/21 0507 06/28/21 0455 06/29/21 0356  WBC 7.7 13.3* 11.8* 13.5*     Liver Function Tests: Recent Labs  Lab 06/27/21 0507  AST 32  ALT 18  ALKPHOS 61  BILITOT 0.3  PROT 6.4*  ALBUMIN 2.9*    No results for input(s): LIPASE, AMYLASE in the last 168 hours. No results for input(s): AMMONIA in the last 168 hours.   ABG    Component Value Date/Time   PHART 7.43 06/30/2021 1441   PCO2ART 42 06/30/2021 1441   PO2ART 71 (L) 06/30/2021 1441   HCO3 27.9 06/30/2021 1441   ACIDBASEDEF 0.5 06/20/2021 0204   O2SAT 94.5 06/30/2021 1441      Coagulation Profile: No results for input(s): INR, PROTIME in the last 168 hours.   Cardiac Enzymes: No results for input(s): CKTOTAL, CKMB, CKMBINDEX, TROPONINI in the last 168 hours.  HbA1C: Hgb A1c MFr Bld  Date/Time Value Ref Range Status  06/20/2021 05:00 AM 8.2 (H) 4.8 - 5.6 % Final    Comment:    (NOTE) Pre diabetes:          5.7%-6.4%  Diabetes:              >6.4%  Glycemic control for   <7.0% adults with diabetes   03/06/2020 07:05 AM 9.1 (H) 4.8 - 5.6 % Final    Comment:    (NOTE)         Prediabetes: 5.7 - 6.4         Diabetes: >6.4         Glycemic control for adults with diabetes: <7.0     CBG: Recent  Labs  Lab 06/29/21 1556 06/29/21 2058 06/30/21 0808 06/30/21 1140 06/30/21 1437  GLUCAP 184* 302* 219* 328* 290*     Allergies Allergies  Allergen Reactions   Atorvastatin Swelling      Home Medications  Prior to Admission medications   Medication Sig Start Date End Date Taking? Authorizing Provider  diclofenac sodium (VOLTAREN) 1 % GEL Apply 2 g topically 2 (two) times daily as needed (pain).    Yes [provider]  ferrous gluconate (FERGON) 324 MG tablet Take 324 mg by mouth 3 (three) times daily. 04/02/21  Yes [provider]  Fluticasone-Umeclidin-Vilant 100-62.5-25 MCG/ACT AEPB Inhale 1 puff into the lungs daily. 05/18/21  Yes [provider]  gabapentin (NEURONTIN) 300 MG capsule Take 600 mg by mouth 2 (two) times daily.    Yes [provider]  insulin aspart (NOVOLOG) 100 UNIT/ML injection Inject 0-20 Units into the skin 3 (three) times daily with meals. (based upon blood sugar reading)   Yes [provider]  LANTUS SOLOSTAR 100 UNIT/ML Solostar Pen Inject 40 Units into the skin 2 (two) times daily. This is decreased from prior dose of 70 units twice daily due to low blood glucose. 07/22/20  Yes Enzo Bi, MD  lisinopril (ZESTRIL) 40 MG tablet Take 40 mg by mouth daily.    Yes [provider]  oxyCODONE-acetaminophen (PERCOCET) 10-325 MG tablet Take 1 tablet by mouth every 6 (six) hours as needed for pain.  04/12/17  Yes [provider]  traZODone (DESYREL) 100 MG tablet Take 200 mg by mouth at bedtime as needed for sleep.   Yes [provider]  venlafaxine XR (EFFEXOR-XR) 75 MG 24 hr capsule Take 75-150 mg by mouth 2 (two) times daily.    Yes [provider]  VENTOLIN HFA 108 (90 Base) MCG/ACT inhaler Inhale 2 puffs into the lungs every 6 (six) hours as needed for wheezing or shortness of breath.    Yes [provider]  Scheduled Meds:  arformoterol  15 mcg Nebulization BID   aspirin EC   81 mg Oral Daily   budesonide (PULMICORT) nebulizer solution  0.25 mg Nebulization BID   chlorhexidine gluconate (MEDLINE KIT)  15 mL Mouth Rinse BID   Chlorhexidine Gluconate Cloth  6 each Topical Q0600   clopidogrel  75 mg Oral Daily   docusate  100 mg Per Tube BID   enoxaparin (LOVENOX) injection  0.5 mg/kg Subcutaneous Q24H   ezetimibe  10 mg Oral Daily   famotidine  20 mg Per Tube BID   feeding supplement  237 mL Oral TID BM   ferrous gluconate  324 mg Oral TID   gabapentin  600 mg Per Tube BID   insulin aspart  0-20 Units Subcutaneous TID WC   insulin aspart  0-5 Units Subcutaneous QHS   insulin aspart  8 Units Subcutaneous TID WC   insulin glargine-yfgn  30 Units Subcutaneous BID   multivitamin with minerals  1 tablet Oral Daily   polyethylene glycol  17 g Per Tube Daily   Continuous Infusions:  sodium chloride 10 mL/hr at 06/30/21 1543   fentaNYL infusion INTRAVENOUS     lacosamide (VIMPAT) IV     midazolam     PRN Meds:.sodium chloride, albuterol, diclofenac Sodium, fentaNYL, hydrALAZINE, ibuprofen, midazolam, ondansetron (ZOFRAN) IV, oxyCODONE **AND** oxyCODONE-acetaminophen, polyethylene glycol  Resolved Hospital Problem list     ASSESSMENT & PLAN  60 y/o female admitted in the setting of acute toxic encephalopathy in the setting of acute embolic strokes and possible seizure.  There was concern for a meningitis picture, but everything seems most consistent with the strokes as the cause of her confusion and subsequent seizures. Developed recurrent seizure on 11/16 and was post ictal afterwards and aspirated and required intubation.  Acute Hypoxic Respiratory Failure secondary to sedation requirements in the setting of severe agitation and encephalopathy- recurrent in post ictal state on 11/16 Full mechanical vent support VAP prevention Daily WUA/SBT  Aspiration pneumonia Unasyn Resp culture  Seizure activity with post ictal state, currently on vimpat Transfer to  tertiary care center for continuous eeg monitoring CT angiogram head now as neurology concerned about possible neurovascular issue Versed infusion for sedation  Acute Encephalopathy persistent during hospitalization PMHx: Anxiety, chronic pain (w/ opioid dependence)  -CT angiogram brain now -PAD protocol: fentanyl, versed infusion  Acute bilateral punctate infarcts (embolic vs. hypotensive episode)   Per neuro ASA   Poorly controlled Type 2 Diabetes Mellitus SSI  Best practice (right click and "Reselect all SmartList Selections" daily)  Diet:  NPO Pain/Anxiety/Delirium protocol (if indicated): Yes (RASS goal -1) VAP protocol (if indicated): Not indicated DVT prophylaxis: LMWH GI prophylaxis: H2B Glucose control:  SSI Yes Central venous access:  N/A Arterial line:  NO Foley:  Yes, and it is still needed Mobility:  bed rest  PT consulted: Yes Last date of multidisciplinary goals of care discussion [06/26/21] Code Status:  full code Disposition: Full code  Cc time 35 minutes  Roselie Awkward, MD Center PCCM Pager: [phone] Cell: [phone] After 7:00 pm call Elink  [phone]

## 2021-06-30 NOTE — Progress Notes (Addendum)
Physical Therapy Treatment Patient Details Name: Lauren Velazquez MRN: [id number] DOB: 1961-07-28 Today's Date: 06/30/2021   History of Present Illness 60 yo woman admitted with acute encephalopathy in the setting of suspected viral meningitis vs. serotonin syndrome vs. 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    PT Comments    Pt semi-supine in crunched position, legs out of bed requiring max assist to reposition and sit upright. Good sitting balance once seated EOB. Pt answered orientation correctly (year, name, situation, place) however is very tangential in thought with incohesive statements. Pt educated on focusing to improve "self" which pt responds to very well. Pt was not agitated or combated this session. Pt performed x 5 STS from lowered bed height with mainly MOD A, RW but was not able to stand longer than 10 s with impulsive behavior to sit EOB. Pt states feeling weak. Pt also notes dizziness but is unable to stand long enough for BP assessment (Supine) 130s/80, SpO2: 100%, HR 91, (seated) BP 126/80, SpO2 99% 2L [place], HR 105 Dizziness absent following prolonged sitting. Pt consistently requesting to sit upright in recliner, able to transfer with MAX A, RW but unsteady overall. CIR remains current recommendation for discharge. Skilled PT intervention is indicated to address deficits in function, mobility, and to return to PLOF as able.     Recommendations for follow up therapy are one component of a multi-disciplinary discharge planning process, led by the attending physician.  Recommendations may be updated based on patient status, additional functional criteria and insurance authorization.  Follow Up Recommendations  Acute inpatient rehab (3hours/day)     Assistance Recommended at Discharge Frequent or constant Supervision/Assistance  Equipment Recommendations  Other  (comment) (TBD next venue of service)    Recommendations for Other Services       Precautions / Restrictions Precautions Precautions: Fall Restrictions Weight Bearing Restrictions: No     Mobility  Bed Mobility Overal bed mobility: Needs Assistance Bed Mobility: Supine to Sit     Supine to sit: Mod assist;HOB elevated     General bed mobility comments: Pt semi supine with leg out of bed, required assist to bring trunk toward bed    Transfers Overall transfer level: Needs assistance Equipment used: Rolling walker (2 wheels) Transfers: Sit to/from Stand;Bed to chair/wheelchair/BSC Sit to Stand: Mod assist     Step pivot transfers: Max assist     General transfer comment: STS x 5 w/ cues for hand placement, pt demonstrates good effort  but unable to stand longer than 10 s and impulsively sits without warning. Pt stepped toward chair but very unstable and required control decent back to chair    Ambulation/Gait                   Stairs             Wheelchair Mobility    Modified Rankin (Stroke Patients Only)       Balance Overall balance assessment: Needs assistance Sitting-balance support: Feet supported Sitting balance-Leahy Scale: Good Sitting balance - Comments: does not require BUE support, lifts arms without lean or LOB   Standing balance support: Bilateral upper extremity supported;During functional activity Standing balance-Leahy Scale: Poor                              Cognition Arousal/Alertness: Awake/alert Behavior During Therapy: WFL for  tasks assessed/performed Overall Cognitive Status: Impaired/Different from baseline                                 General Comments: Able to answer all orientation questions and acknowledges her unsteadiness with mobility, but tangential in thought and remarks such as "my husband is cheating on me" at inappropriate times. Pt also with compliants and dissatisfaction with  current status, RN notified.        Exercises      General Comments General comments (skin integrity, edema, etc.): BP 126/80, SpO2 99% 2L [place], HR 105 seated activity, unable to assess BP in standing d/t pt tolerance      Pertinent Vitals/Pain Pain Assessment: Faces Faces Pain Scale: Hurts little more Pain Location: Low Back Pain Descriptors / Indicators: Aching;Discomfort;Grimacing Pain Intervention(s): Limited activity within patient's tolerance;Monitored during session;Repositioned;Premedicated before session    Home Living                          Prior Function            PT Goals (current goals can now be found in the care plan section) Progress towards PT goals: Progressing toward goals    Frequency    Min 2X/week      PT Plan Current plan remains appropriate    Co-evaluation              AM-PAC PT "6 Clicks" Mobility   Outcome Measure  Help needed turning from your back to your side while in a flat bed without using bedrails?: A Lot Help needed moving from lying on your back to sitting on the side of a flat bed without using bedrails?: A Lot Help needed moving to and from a bed to a chair (including a wheelchair)?: A Lot Help needed standing up from a chair using your arms (e.g., wheelchair or bedside chair)?: A Lot Help needed to walk in hospital room?: A Lot Help needed climbing 3-5 steps with a railing? : Total 6 Click Score: 11    End of Session Equipment Utilized During Treatment: Gait belt;Oxygen Activity Tolerance: Patient tolerated treatment well Patient left: with call bell/phone within reach;with family/visitor present;in chair;with chair alarm set Nurse Communication: Mobility status PT Visit Diagnosis: Unsteadiness on feet (R26.81);Muscle weakness (generalized) (M62.81);Other abnormalities of gait and mobility (R26.89);Other symptoms and signs involving the nervous system (I09.735)     Time: 3299-2426 PT Time Calculation  (min) (ACUTE ONLY): 48 min  Charges:                        [employer], SPT

## 2021-06-30 NOTE — Progress Notes (Signed)
eLink Physician-Brief Progress Note Patient Name: Lauren Velazquez DOB: 1961-02-22 MRN: [id number]   Date of Service  06/30/2021  HPI/Events of Note  Patient transferred from [facility] to [facility] tonight for cEEG monitoring, she was admitted with altered mental, CVA, seizures, acute hypoxic respiratory failure requiring intubation and mechanical ventilation.  eICU Interventions  New Patient Evaluation.        Thomasene Lot Jovanka [place] 06/30/2021, 10:09 PM

## 2021-06-30 NOTE — TOC Initial Note (Addendum)
Transition of Care [facility]) - Initial/Assessment Note    Patient Details  Name: Lauren Velazquez MRN: [id number] Date of Birth: 21-May-1961  Transition of Care [facility]) CM/SW Contact:    Caryn Section, RN Phone Number: 06/30/2021, 1:52 PM  Clinical NarrativeP                 Patient requires Acute Inpatient Rehab for condition.  Patient's insurance is not in network with [facility].  Awaiting call back from [facility] [phone], fax [phone]  [facility] [phone]-left message  [facility] : [phone]  Awaiting responses from facilities.   Addendum;fax number for [facility] [phone]       Patient Goals and CMS Choice        Expected Discharge Plan and Services                                                Prior Living Arrangements/Services                       Activities of Daily Living Home Assistive Devices/Equipment: None ADL Screening (condition at time of admission) Patient's cognitive ability adequate to safely complete daily activities?: No Is the patient deaf or have difficulty hearing?: No Does the patient have difficulty seeing, even when wearing glasses/contacts?: No Does the patient have difficulty concentrating, remembering, or making decisions?: No Patient able to express need for assistance with ADLs?: Yes Does the patient have difficulty dressing or bathing?: Yes Independently performs ADLs?: No Communication: Dependent Is this a change from baseline?: Change from baseline, expected to last <3 days Dressing (OT): Dependent Is this a change from baseline?: Change from baseline, expected to last <3days Grooming: Dependent Is this a change from baseline?: Change from baseline, expected to last <3 days Feeding: Dependent Is this a change from baseline?: Change from baseline, expected to last <3 days Bathing: Dependent Is this a change from baseline?: Change from baseline, expected to last <3  days Toileting: Dependent Is this a change from baseline?: Change from baseline, expected to last <3 days In/Out Bed: Dependent Is this a change from baseline?: Change from baseline, expected to last <3 days Walks in Home: Dependent Is this a change from baseline?: Change from baseline, expected to last <3 days Does the patient have difficulty walking or climbing stairs?: Yes Weakness of Legs: Both Weakness of Arms/Hands: Both  Permission Sought/Granted                  Emotional Assessment              Admission diagnosis:  Altered mental status [R41.82] Encephalopathy [G93.40] Fever, unspecified fever cause [R50.9] Bizarre behavior [R46.2] Leukocytosis, unspecified type [D72.829] Acute sepsis (HCC) [A41.9] Patient Active Problem List   Diagnosis Date Noted   Acute delirium 06/21/2021   Altered mental status 06/20/2021   Insulin dependent type 2 diabetes mellitus (HCC) 06/20/2021   Acute sepsis (HCC)    Pneumonia due to COVID-19 virus 07/15/2020   Acute respiratory failure due to COVID-19 (HCC) 07/15/2020   Obesity, Class III, BMI 40-49.9 (morbid obesity) (HCC) 07/15/2020   Chronic prescription opiate use 07/15/2020   Bacterial pneumonia 07/15/2020   Acute hypoxemic respiratory failure due to COVID-19 (HCC) 07/15/2020   Suspected COVID-19 virus infection 03/06/2020   Elevated troponin  03/06/2020   Acute respiratory failure with hypoxia (HCC) 03/05/2020   Diabetes mellitus type 2, uncomplicated (HCC) 05/30/2014   Hyperlipidemia 05/30/2014   Hypertension 05/30/2014   Obesity 05/30/2014   Vertigo 05/30/2014   Carpal tunnel syndrome on both sides 12/27/2013   PCP:  Hillery Aldo, MD Pharmacy:   [facility] - [place], [place] - [address] [place] [address] Phone: [phone] Fax: [phone]  [id number] [place], [place] - [address] AT [facility] [address] [place]  [address] Phone: [phone] Fax: [phone]     Social Determinants of Health (SDOH) Interventions    Readmission Risk Interventions No flowsheet data found.

## 2021-06-30 NOTE — Progress Notes (Signed)
Pharmacy Antibiotic Note  Lauren Velazquez is a 60 y.o. female admitted on 06/19/2021. Rapid response called 11/16 requiring transfer to the ICU and intubation. Concern for aspiration. Pharmacy has been consulted for Unasyn dosing.  Plan: Unasyn 3 g IV q6h  Height: 5\' 1"  (154.9 cm) Weight: 104.8 kg (231 lb 0.7 oz) IBW/kg (Calculated) : 47.8  Temp (24hrs), Avg:98.4 F (36.9 C), Min:98 F (36.7 C), Max:98.7 F (37.1 C)  Recent Labs  Lab 06/25/21 0425 06/26/21 0403 06/27/21 0507 06/28/21 0455 06/29/21 0356 06/30/21 1551  WBC  --  7.7 13.3* 11.8* 13.5* 11.6*  CREATININE 0.35* 0.43* 0.53 0.48 0.45  --     Estimated Creatinine Clearance: 85.4 mL/min (by C-G formula based on SCr of 0.45 mg/dL).    Allergies  Allergen Reactions   Atorvastatin Swelling    Antimicrobials this admission: Unasyn 11/16 >>  Microbiology results: 11/16 Trach aspirate: pending 11/5 BCx: no growth   Thank you for allowing pharmacy to be a part of this patient's care.  13/5, PharmD, BCPS Clinical Pharmacist 06/30/2021 4:23 PM

## 2021-06-30 NOTE — Progress Notes (Signed)
Patient left facility with [facility] @ approximately 2042.

## 2021-06-30 NOTE — Progress Notes (Addendum)
Patient ID: Lauren Velazquez, female   DOB: 1961/05/30, 60 y.o.   MRN: [id number] Rapid Response was called for pt being unresponsive patient appears cold and clammy. Initial blood pressure with forearm breathing was systolic in the 50s. Started IV bolus normal saline. Pupils bilaterally: reacting to leg. Blood pressure went up to 80/45. Sats remaining 99 on the monitor. ABG look stable. Received Narcan 0.4 mg x 1 Transfer to ICU D/w Jeri Modena NP ICU to follow along.  D/w dter and husband in the ICU waiting room  Pt is being intubated for airway protection.  Neuro consulted for possible post-ictal state--message sent to Dr Wilford Corner Noted to have some frothing form her mouth

## 2021-06-30 NOTE — Progress Notes (Signed)
[facility] [place] NAME: Lauren Velazquez    MR#:  [id number]  DATE OF BIRTH:  1961-05-21  SUBJECTIVE:  patient worked with physical therapy now sitting in the chair. Intermittently doses off although answers questions. Tells me her sugars are high. No respiratory distress. Remains overall calm. REVIEW OF SYSTEMS:   Review of Systems  Constitutional:  Negative for chills, fever and weight loss.  HENT:  Negative for ear discharge, ear pain and nosebleeds.   Eyes:  Negative for blurred vision, pain and discharge.  Respiratory:  Negative for sputum production, shortness of breath, wheezing and stridor.   Cardiovascular:  Negative for chest pain, palpitations, orthopnea and PND.  Gastrointestinal:  Negative for abdominal pain, diarrhea, nausea and vomiting.  Genitourinary:  Negative for frequency and urgency.  Musculoskeletal:  Negative for back pain and joint pain.  Neurological:  Positive for weakness. Negative for sensory change, speech change and focal weakness.  Psychiatric/Behavioral:  Negative for depression and hallucinations. The patient is not nervous/anxious.   Tolerating Diet:yes Tolerating PT: CIR  DRUG ALLERGIES:   Allergies  Allergen Reactions   Atorvastatin Swelling    VITALS:  Blood pressure (!) 99/56, pulse 98, temperature 98.1 F (36.7 C), temperature source Oral, resp. rate 18, height 5\' 1"  (1.549 m), weight 104.8 kg, last menstrual period 08/15/2008, SpO2 100 %.  PHYSICAL EXAMINATION:   Physical Exam  GENERAL:  60 y.o.-year-old patient lying in the bed with no acute distress. Morbid obesity HEENT: Head atraumatic, normocephalic. Oropharynx and nasopharynx clear.  LUNGS: decreased breath sounds bilaterally, no wheezing, rales, rhonchi. No use of accessory muscles of respiration.  CARDIOVASCULAR: S1, S2 normal. No murmurs, rubs, or gallops.  ABDOMEN: Soft, nontender, nondistended. Bowel sounds present. No  organomegaly or mass.  EXTREMITIES: No cyanosis, clubbing or edema b/l.    NEUROLOGIC: nonfocal PSYCHIATRIC:  patient is alert and oriented x 2.  SKIN: No obvious rash, lesion, or ulcer.   LABORATORY PANEL:  CBC Recent Labs  Lab 06/29/21 0356  WBC 13.5*  HGB 11.7*  HCT 41.0  PLT 584*     Chemistries  Recent Labs  Lab 06/27/21 0507 06/28/21 0455 06/29/21 0356  NA 141   < > 144  K 3.2*   < > 3.3*  CL 102   < > 105  CO2 31   < > 27  GLUCOSE 98   < > 190*  BUN 10   < > 7  CREATININE 0.53   < > 0.45  CALCIUM 8.6*   < > 9.2  MG 2.1   < > 2.4  AST 32  --   --   ALT 18  --   --   ALKPHOS 61  --   --   BILITOT 0.3  --   --    < > = values in this interval not displayed.    Cardiac Enzymes No results for input(s): TROPONINI in the last 168 hours. RADIOLOGY:  No results found. ASSESSMENT AND PLAN:   60 yo woman with pmhx significant for DM2, HTN, vertigo, AMS admitted with acute encephalopathy requiring intubation for airway protection who is now extubated and currently on 2 to 3 L nasal cannula oxygen. Patient wears chronic oxygen at home.  Acute metabolic encephalopathy with agitation and bizarre behavior -- differential diagnosis was possibility of meningitis versus serotonin syndrome versus stroke versus polypharmacy -- meningitis was ruled out. Patient is off IV antibiotics. -- MRI showed evidence of  embolic stroke. Neurology Dr. Selina Cooley recommends aspirin and Plavix. She also recommends vimpat for now till she is follwoed by out pt neurology -- patient is much alert awake and oriented. -- TEE was done ruled out embolic event -- Echo showed normal LV EF, grade one diastolic dysfunction, no significant valvular abnormality and no anti-cardiac clot -- EEG showed moderate diffuse slowing with subtle superimpose right focal showing without epileptiform abnormality-- now on empiric vimpat --11/16--cont PT--TOC for d/c planning  acute delirium with agitation  -- patient  was seen by psychiatry Dr. Toni Amend. IVC has been discontinued. No further recommendations other than continue patient's trazodone and venlafaxine. --pt pleasant no sitter required --d/c tele sitter for now  Type II diabetes with neuropathy -- A1c 8.2 -- continue long-acting insulin and sliding scale -- continue gabapentin  Hypertension -- continue lisinopril  Genella Rife continue Pepcid  history of COPD/asthma -- on chronic home oxygen -- continue inhalers as needed  morbid obesity    Procedures: Family communication : left message for patient's husband Lauren Velazquez Consults :PCCM, neurology, ID, Psych CODE STATUS: full DVT Prophylaxis :lovenox Level of care: Med-Surg Status is: Inpatient  Remains inpatient appropriate because: discharge planning 25        TOTAL TIME TAKING CARE OF THIS PATIENT: 25 minutes.  >50% time spent on counselling and coordination of care  Note: This dictation was prepared with Dragon dictation along with smaller phrase technology. Any transcriptional errors that result from this process are unintentional.  Enedina Finner M.D    [facility]   CC: Primary care physician; Lauren Aldo, MD Patient ID: Lauren Velazquez, female   DOB: 27-Aug-1960, 60 y.o.   MRN: [id number]

## 2021-06-30 NOTE — H&P (Addendum)
NAME:  Lauren Velazquez, MRN:  [id number], DOB:  1961/03/25, LOS: 0 ADMISSION DATE:  06/30/2021, INITIAL CONSULTATION DATE:  06/19/2021 REFERRING MD:  Dr. Leonides Schanz Chrisitine, CHIEF COMPLAINT:  Altered Mental Status   Brief Patient Description  60 year old female presenting to [facility] ED from home via EMS for multiple complaints including stroke, chest pain, shortness of breath and pain all over. (Per ED documentation)   ED course: Per ED documentation upon arrival patient was agitated and extremely altered, exhibiting bizarre behavior.  She was screaming and yelling sticking her finger down her throat and attempt to make her self vomit.  While sitting in a recliner began attempting to flip it over eventually requiring physical restraint for her own safety.  ED staff attempted to sedate her as she was kicking and swinging arms at staff and husband.  Her main complaint to the ED provider was chest pain, she stated she was worried that she might have had a heart attack as well as feeling very dehydrated.  She also insisted on being given her chronic pain medication, however she also told staff she will take all her pills and overdose. Patient was placed under IVC.  Per ED documentation, husband denied recreational drug use or any new medications (unclear if this comment was made to EDP or EMS).  While in the ED patient became febrile and sepsis protocol was initiated.  No definitive source of infection noted in CXR/UA, this in combination with severely altered mental status raise concern for meningitis.  Due to patient's increasingly agitated state refractory to benzodiazepines the decision was made by the EDP to mechanically intubate for airway protection in order to provide further sedation so that an LP could be performed safely  Medications given: 50 mg Benadryl, 5 mg Haldol, 2 mg Ativan x2, cefepime/vancomycin/acyclovir, 10 mg of Decadron, total 2.6 L IV fluid bolus, succinylcholine for RSI, 650 mg acetaminophen  for fever Initial Vitals: Severely febrile at 103.9, tachypneic 32, tachycardic 134, hypertensive urgency 232/97 & 95% on room air EKG Interpretation: Date: 06/19/2021, EKG Time: 23:23, Rate: 121, Rhythm: Sinus tachycardia, QRS Axis: RAD, Intervals: Prolonged QTC, ST/T Wave abnormalities: None, Narrative Interpretation: Sinus tachycardia with RAD Chemistry: Na+: 134, K+: 3.9, BUN/Cr.:  9/0.76, Serum CO2/ AG: 28/9 Hematology: WBC: 14.4, Hgb: 12.2,  Normal troponin: 8> 15,  Lactic/ PCT: 4.5/pending, COVID-19 & Influenza A/B: negative ABG: 7.46/32/177/22.8 CXR 06/20/21: mild central vascular congestion CT head wo contrast 06/20/21: no acute intracranial abnormality   On 11/16 Pt had been transferred to the floor and had been intermittently drowsy throughout the day, then had an episode of complete unresponsiveness with foaming at the mouth and posturing with acute hypotension.  She was intubated emergently and transferred to the ICU.   Patient was evaluated by neurology and stat CT and CTA head and neck were negative for acute findings, she was loaded with 4g Keppra and transferred to [facility] hospital for continuous EEG.  Pertinent  Medical History  T2DM HTN Vertigo HLD Anxiety Chronic Back pain COVID-19   Significant Hospital Events: Including procedures, antibiotic start and stop dates in addition to other pertinent events   06/20/21- Admit to ICU  with suspected meningitis s/p mechanical intubation for airway protection in the setting of increased sedation needs and agitation. S/p LP @2 :37 AM. ID consult. Patient started empirically on vancomycin, ceftriaxone, ampicillin and acyclovir.  ID consult. 06/21/21- MRI w/out contrast shows acute infarcts (embolic vs. Hypotensive). Neuro, and psych consulted.  TTE showed normal LVEF, grade I diastolic  dysfunction, no sig valvular abnormalities and no intracardiac clot.  rEEG showed moderate diffuse slowing with subtle superimposed right focal slowing without  epileptiform abnormalities 06/22/21-persistent agitation off of sedation.  Started Depakene for mood stabilization and cyproheptadine for potential serotonergic symptoms. 06/23/21: Remains on the vent and sedated. 06/24/21: Remains on the vent. Neuro eval recs central embolic suspected.  Start ASA 81mg  daily + plavix 75mg  daily x21 days f/b ASA 81mg  daily after that.  Consider TEE 06/25/21: Remains on the vent and sedation.  Status post TEE negative for thrombus or vegetation. 06/26/21: S/p extubation now IVC with sitter at the bedside 06/27/21: Stable on Opal @2L  with fluctuating mental status. Sitter at bedside 06/28/21: Restart home dose of oxycodone for concern of possible withdrawal. EEG per neuro r/o seizure activity. Patient stable, able to be transferred to step down care. PCCM to sign sign 06/29/21: Transferred to Med/surg 06/30/21: Rapid response called for unresponsiveness and hypotension.  Upon arrival to ICU concern for seizure activity.  Intubated for airway protection.  Transferred to [facility] for continuous EEG, PCCM admit  Cultures:  11/6>> CSF- mild pleocytosis. Culture- no growth at 4 days. VDRL- negative.  11/6>> blood cultures- negative (no growth @4d ) 11/6>> MRSA PCR - negative  11/6>> HSV1/2- negative  11/6>> Crypotococcal Ag - negative   11/16 resp culture >   Antimicrobials:  11/6 Vancomycin >> 11/10 11/6 Ampicillin >>11/13 11/6 Ceftriaxone >>11/14 11/6 Acyclovir >>11/9 11/6 Cefepime >> x 1 dose  11/16 unasyn >   Interim History / Subjective:   Pt transferred to [facility] uneventfully and remained hemodynamically stable  OBJECTIVE   Pulse 85, temperature 98.4 F (36.9 C), temperature source Axillary, resp. rate (!) 23, last menstrual period 08/15/2008, SpO2 96 %.    Vent Mode: PRVC FiO2 (%):  [50 %-100 %] 50 % Set Rate:  [20 bmp] 20 bmp Vt Set:  [420 mL] 420 mL PEEP:  [5 cmH20] 5 cmH20 Plateau Pressure:  [16 cmH20] 16 cmH20  No intake or output data in the  24 hours ending 06/30/21 2155   General:  well-nourished F critically ill-appearing F intubated and sedated HEENT: MM pink/moist, pupils equal, sclera anicteric  Neuro: examined on Fentanyl and Versed, unresponsive to voice, grimaces to pain, + gag CV: s1s2 rrr, no m/r/g PULM:  On full vent support with mechanical breath sounds bilaterally  GI: soft, bsx4 active  Extremities: warm/dry, 1+ edema  Skin: no rashes or lesions     Labs/imaging that I havepersonally reviewed  (right click and "Reselect all SmartList Selections" daily)  CXR 06/20/21: mild central vascular congestion CT head wo contrast 06/20/21: no acute intracranial abnormality CT abd/pelvis 06/20/21: No evidence of inflammatory process or abscess. Evidence of cholelithiasis, bilateral nephrolithiasis, 5.4 cm benign-appearing left adnexal mass.   CSF 06/20/21: mild pleocytosis MR brain w/out 06/21/21: Scattered punctate acute infarcts in both cerebral hemispheres (bilateral PCA and left MCA territories). No associated hemorrhage or mass effect. Although the posterior fossa and anterior cerebral artery territories are spared, the pattern is suspicious for a recent embolic event. Hypotensive episode might also produce this appearance. 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  Echo 06/21/21: EF 60-65%, otherwise normal.  EEG 06/21/21: moderate diffuse slowing, focal right sided slowing, no epileptiform abnormalities.  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 Wide patency of the left carotid artery system within the neck.  Widely patent vertebral arteries within the neck. TEE 06/25/2021: negative for thrombus or vegetation  CTA Head and Neck 11/16: No acute intracranial process or significant stenosis   Labs   CBC: Recent Labs  Lab 06/26/21 0403 06/27/21 0507  06/28/21 0455 06/29/21 0356 06/30/21 1551  WBC 7.7 13.3* 11.8* 13.5* 11.6*  HGB 8.8* 8.8* 8.8* 11.7* 11.3*  HCT 29.7* 29.5* 29.7* 41.0 39.7  MCV 84.1 81.0 82.3 83.5 86.9  PLT 335 372 399 584* 506*     Basic Metabolic Panel: Recent Labs  Lab 06/26/21 0403 06/27/21 0507 06/28/21 0455 06/29/21 0356 06/30/21 1551  NA 144 141 140 144 139  K 4.3 3.2* 3.5 3.3* 4.1  CL 108 102 103 105 105  CO2 30 31 28 27 24   GLUCOSE 193* 98 82 190* 274*  BUN 12 10 10 7 10   CREATININE 0.43* 0.53 0.48 0.45 1.25*  CALCIUM 8.6* 8.6* 8.6* 9.2 9.0  MG 2.4 2.1 2.3 2.4  --   PHOS 3.0 2.8 3.0 2.9  --     GFR: Estimated Creatinine Clearance: 54.7 mL/min (A) (by C-G formula based on SCr of 1.25 mg/dL (H)). Recent Labs  Lab 06/27/21 0507 06/28/21 0455 06/29/21 0356 06/30/21 1551  WBC 13.3* 11.8* 13.5* 11.6*     Liver Function Tests: Recent Labs  Lab 06/27/21 0507  AST 32  ALT 18  ALKPHOS 61  BILITOT 0.3  PROT 6.4*  ALBUMIN 2.9*    No results for input(s): LIPASE, AMYLASE in the last 168 hours. No results for input(s): AMMONIA in the last 168 hours.   ABG    Component Value Date/Time   PHART 7.31 (L) 06/30/2021 1600   PCO2ART 55 (H) 06/30/2021 1600   PO2ART 64 (L) 06/30/2021 1600   HCO3 27.7 06/30/2021 1600   ACIDBASEDEF 0.5 06/20/2021 0204   O2SAT 89.9 06/30/2021 1600      Coagulation Profile: No results for input(s): INR, PROTIME in the last 168 hours.   Cardiac Enzymes: No results for input(s): CKTOTAL, CKMB, CKMBINDEX, TROPONINI in the last 168 hours.  HbA1C: Hgb A1c MFr Bld  Date/Time Value Ref Range Status  06/20/2021 05:00 AM 8.2 (H) 4.8 - 5.6 % Final    Comment:    (NOTE) Pre diabetes:          5.7%-6.4%  Diabetes:              >6.4%  Glycemic control for   <7.0% adults with diabetes   03/06/2020 07:05 AM 9.1 (H) 4.8 - 5.6 % Final    Comment:    (NOTE)         Prediabetes: 5.7 - 6.4         Diabetes: >6.4         Glycemic control for adults with  diabetes: <7.0     CBG: Recent Labs  Lab 06/30/21 0808 06/30/21 1140 06/30/21 1437 06/30/21 1606 06/30/21 1914  GLUCAP 219* 328* 290* 269* 282*     Allergies Allergies  Allergen Reactions   Atorvastatin Swelling      Home Medications  Prior to Admission medications   Medication Sig Start Date End Date Taking? Authorizing Provider  diclofenac sodium (VOLTAREN) 1 % GEL Apply 2 g topically 2 (two) times daily as needed (pain).    Yes [provider]  ferrous gluconate (FERGON) 324 MG tablet Take 324 mg by mouth 3 (three) times daily. 04/02/21  Yes [provider]  Fluticasone-Umeclidin-Vilant 100-62.5-25 MCG/ACT AEPB Inhale 1 puff into the  lungs daily. 05/18/21  Yes [provider]  gabapentin (NEURONTIN) 300 MG capsule Take 600 mg by mouth 2 (two) times daily.    Yes [provider]  insulin aspart (NOVOLOG) 100 UNIT/ML injection Inject 0-20 Units into the skin 3 (three) times daily with meals. (based upon blood sugar reading)   Yes [provider]  LANTUS SOLOSTAR 100 UNIT/ML Solostar Pen Inject 40 Units into the skin 2 (two) times daily. This is decreased from prior dose of 70 units twice daily due to low blood glucose. 07/22/20  Yes Enzo Bi, MD  lisinopril (ZESTRIL) 40 MG tablet Take 40 mg by mouth daily.    Yes [provider]  oxyCODONE-acetaminophen (PERCOCET) 10-325 MG tablet Take 1 tablet by mouth every 6 (six) hours as needed for pain.  04/12/17  Yes [provider]  traZODone (DESYREL) 100 MG tablet Take 200 mg by mouth at bedtime as needed for sleep.   Yes [provider]  venlafaxine XR (EFFEXOR-XR) 75 MG 24 hr capsule Take 75-150 mg by mouth 2 (two) times daily.    Yes [provider]  VENTOLIN HFA 108 (90 Base) MCG/ACT inhaler Inhale 2 puffs into the lungs every 6 (six) hours as needed for wheezing or shortness of breath.    Yes [provider]  Scheduled Meds: REM  Continuous  Infusions: REM  PRN Meds:.REM  Resolved Hospital Problem list     ASSESSMENT & PLAN  60 y/o female admitted in the setting of acute toxic encephalopathy in the setting of acute embolic strokes and possible seizure.  There was concern for a meningitis picture, but everything seems most consistent with the strokes as the cause of her confusion and subsequent seizures. Developed recurrent seizure on 11/16 and was post ictal afterwards and aspirated and required intubation.  Acute Hypoxic Respiratory Failure secondary to sedation requirements in the setting of severe agitation and encephalopathy- recurrent in post ictal state on 11/16 Aspiration PNA -continue Unasyn and check respiratory culture --Maintain full vent support with SAT/SBT as tolerated -titrate Vent setting to maintain SpO2 greater than or equal to 90%. -HOB elevated 30 degrees. -Plateau pressures less than 30 cm H20.  -Follow chest x-ray, ABG prn.   -Bronchial hygiene and RT/bronchodilator protocol. -VAP prevention     Seizure activity with post ictal state and acute encephalopathy Repeat head and neck CTA were negative Intubated as above and transferred to [facility] for continuous EEG -on Vimpat 100mg  bid and Keppra 500mg  bid per neurology recs -continuous EEG -versed gtt     Acute bilateral punctate infarcts (embolic vs. hypotensive episode)   Scattered punctate acute infarcts in both cerebral hemispheres, Seen on MRI 11/7 TEE negative -management per neurology -continue Asa   Poorly controlled Type 2 Diabetes Mellitus -SSI  Best practice (right click and "Reselect all SmartList Selections" daily)  Diet:  NPO Pain/Anxiety/Delirium protocol (if indicated): Yes (RASS goal 0) VAP protocol (if indicated): Yes DVT prophylaxis: LMWH GI prophylaxis: H2B Glucose control:  SSI Yes Central venous access:  N/A Arterial line:  NO Foley:  Yes, and it is still needed Mobility:  bed rest  PT consulted:  Last  date of multidisciplinary goals of care discussion [06/26/21] Code Status:  full code Disposition: ICU    CRITICAL CARE Performed by: Otilio Carpen Natoria Archibald   Total critical care time: 40 minutes  Critical care time was exclusive of separately billable procedures and treating other patients.  Critical care was necessary to treat or prevent imminent  or life-threatening deterioration.  Critical care was time spent personally by me on the following activities: development of treatment plan with patient and/or surrogate as well as nursing, discussions with consultants, evaluation of patient's response to treatment, examination of patient, obtaining history from patient or surrogate, ordering and performing treatments and interventions, ordering and review of laboratory studies, ordering and review of radiographic studies, pulse oximetry and re-evaluation of patient's condition.  Otilio Carpen Andrzej Scully, PA-C [facility] See Amion for pager If no response to pager , please call [phone] until 7pm After 7:00 pm call Elink  [phone]?[place]

## 2021-06-30 NOTE — Progress Notes (Signed)
Report given to [facility] on patient. VSS.  Patient unable to follow commands. Rass -4 at this time. Patient currently sedated on Fentanyl 75 mcg/hr & Versed @ 4 mg/hr. Vent settings FIO2 @ 50%, Peep 5, Rate 20 & TV 420. CBG 282. UOP 215. Estimated ETA 20 min. Will continue to monitor.

## 2021-06-30 NOTE — Significant Event (Signed)
Rapid Response Event Note   Reason for Call :  Unresponsive, low BP  Initial Focused Assessment:  Rapid response nurse arrived in patient's room with patient sitting up in chair with eyes closed surrounded by Dr. Allena Katz, 1C staff, patient's daughter, and other nurses. Per patient's daughter, patient's daughter came into patient's room and could not get response from patient. She then alerted 1C staff. Patient diaphoretic, unable to be aroused even to painful stimulus, and having borderline snoring respirations. Pupils large at about 6 mm and equally round and briskly reactive to light. Pulse was present but very weak, BP reading low with SBPS 50-70s and MAPs 40s. Per RT at bedside, patient had questionable seizure activity after extubation over the weekend and had been started on vimpat. Per 1C staff patient got her vimpat dose this morning.  Patient did receive her prn narcotics earlier but at around 09:00 and had been fine until at least 13:15. CBG 290  Interventions:  Gave 0.4 mg narcan IV per Dr. Allena Katz with no response. Started 500 mL IV bolus of normal saline per Dr. Allena Katz. Respiratory therapy obtained ABG. Patient transferred back to bed from chair with rapid response nurse, physical therapist who was nearby, and 1C staff.  Plan of Care:  Patient transferred to ICU 13. ABG results arrived while transferring which showed pH 7.43 and O2 of 71 2L nasal cannula. While Dr. Allena Katz updated ICU MD and NP. Patient intubated upon arrival to ICU.  Event Summary:   MD Notified: Dr. Allena Katz Call Time: 14:38 Arrival Time: 14:40 End Time: 15:08  Bennie [place], RN

## 2021-06-30 NOTE — Progress Notes (Signed)
[facility] PCCM  CT Angiogram head negative  Plan to move to [facility] for continuous EEG monitoring  Heber Leadington, MD [facility] PCCM Pager: [phone] Cell: [phone] After 7:00 pm call Elink  [phone]

## 2021-07-01 ENCOUNTER — Inpatient Hospital Stay (HOSPITAL_COMMUNITY): Payer: BLUE CROSS/BLUE SHIELD

## 2021-07-01 DIAGNOSIS — G9340 Encephalopathy, unspecified: Secondary | ICD-10-CM

## 2021-07-01 DIAGNOSIS — I639 Cerebral infarction, unspecified: Secondary | ICD-10-CM

## 2021-07-01 DIAGNOSIS — R0689 Other abnormalities of breathing: Secondary | ICD-10-CM

## 2021-07-01 DIAGNOSIS — I6310 Cerebral infarction due to embolism of unspecified precerebral artery: Secondary | ICD-10-CM

## 2021-07-01 LAB — CBC
HCT: 36 % (ref 36.0–46.0)
Hemoglobin: 10.3 g/dL — ABNORMAL LOW (ref 12.0–15.0)
MCH: 24.3 pg — ABNORMAL LOW (ref 26.0–34.0)
MCHC: 28.6 g/dL — ABNORMAL LOW (ref 30.0–36.0)
MCV: 84.9 fL (ref 80.0–100.0)
Platelets: 487 10*3/uL — ABNORMAL HIGH (ref 150–400)
RBC: 4.24 MIL/uL (ref 3.87–5.11)
RDW: 17.4 % — ABNORMAL HIGH (ref 11.5–15.5)
WBC: 13.6 10*3/uL — ABNORMAL HIGH (ref 4.0–10.5)
nRBC: 0.3 % — ABNORMAL HIGH (ref 0.0–0.2)

## 2021-07-01 LAB — PHOSPHORUS: Phosphorus: 3.5 mg/dL (ref 2.5–4.6)

## 2021-07-01 LAB — BASIC METABOLIC PANEL
Anion gap: 10 (ref 5–15)
BUN: 10 mg/dL (ref 6–20)
CO2: 27 mmol/L (ref 22–32)
Calcium: 8.8 mg/dL — ABNORMAL LOW (ref 8.9–10.3)
Chloride: 104 mmol/L (ref 98–111)
Creatinine, Ser: 0.84 mg/dL (ref 0.44–1.00)
GFR, Estimated: >60 mL/min (ref >60)
Glucose, Bld: 114 mg/dL — ABNORMAL HIGH (ref 70–99)
Potassium: 4 mmol/L (ref 3.5–5.1)
Sodium: 141 mmol/L (ref 135–145)

## 2021-07-01 LAB — GLUCOSE, CAPILLARY
Glucose-Capillary: 111 mg/dL — ABNORMAL HIGH (ref 70–99)
Glucose-Capillary: 156 mg/dL — ABNORMAL HIGH (ref 70–99)
Glucose-Capillary: 157 mg/dL — ABNORMAL HIGH (ref 70–99)
Glucose-Capillary: 159 mg/dL — ABNORMAL HIGH (ref 70–99)
Glucose-Capillary: 179 mg/dL — ABNORMAL HIGH (ref 70–99)
Glucose-Capillary: 204 mg/dL — ABNORMAL HIGH (ref 70–99)
Glucose-Capillary: 213 mg/dL — ABNORMAL HIGH (ref 70–99)

## 2021-07-01 LAB — MAGNESIUM: Magnesium: 2.5 mg/dL — ABNORMAL HIGH (ref 1.7–2.4)

## 2021-07-01 MED ORDER — ASPIRIN 81 MG PO CHEW
81.0000 mg | CHEWABLE_TABLET | Freq: Every day | ORAL | Status: DC
  Administered 2021-07-01 – 2021-07-05 (×4): 81 mg
  Filled 2021-07-01 (×4): qty 1

## 2021-07-01 MED ORDER — ORAL CARE MOUTH RINSE
15.0000 mL | OROMUCOSAL | Status: DC
  Administered 2021-07-01 – 2021-07-05 (×41): 15 mL via OROMUCOSAL

## 2021-07-01 MED ORDER — LACTATED RINGERS IV SOLN: INTRAVENOUS | Status: DC

## 2021-07-01 MED ORDER — SODIUM CHLORIDE 0.9 % IV SOLN: INTRAVENOUS | Status: DC | PRN

## 2021-07-01 MED ORDER — GLUCERNA 1.5 CAL PO LIQD
1000.0000 mL | ORAL | Status: DC
  Administered 2021-07-01 – 2021-07-03 (×3): 1000 mL
  Filled 2021-07-01 (×8): qty 1000

## 2021-07-01 MED ORDER — CHLORHEXIDINE GLUCONATE 0.12% ORAL RINSE (MEDLINE KIT)
15.0000 mL | Freq: Two times a day (BID) | OROMUCOSAL | Status: DC
  Administered 2021-07-01 – 2021-07-04 (×7): 15 mL via OROMUCOSAL

## 2021-07-01 MED ORDER — SODIUM CHLORIDE 0.9 % IV BOLUS
500.0000 mL | Freq: Once | INTRAVENOUS | Status: AC
  Administered 2021-07-01: 23:00:00 500 mL via INTRAVENOUS

## 2021-07-01 MED ORDER — PROSOURCE TF PO LIQD
45.0000 mL | Freq: Every day | ORAL | Status: DC
  Administered 2021-07-01 – 2021-07-05 (×4): 45 mL
  Filled 2021-07-01 (×4): qty 45

## 2021-07-01 MED ORDER — CHLORHEXIDINE GLUCONATE CLOTH 2 % EX PADS
6.0000 | MEDICATED_PAD | Freq: Every day | CUTANEOUS | Status: DC
  Administered 2021-07-02 – 2021-07-08 (×5): 6 via TOPICAL

## 2021-07-01 NOTE — Procedures (Signed)
Patient Name: Lauren Velazquez  MRN: [id number]  Epilepsy Attending: Charlsie Quest  Referring Physician/Provider: Dr. Caryl Pina Date: 07/01/2021 Duration: 22.04 mins  Patient history: 60 year old female with altered mental status and seizure.  EEG to evaluate for seizure.  Level of alertness:  comatose  AEDs during EEG study: Keppra, Vimpat Technical aspects: This EEG study was done with scalp electrodes positioned according to the 10-20 International system of electrode placement. Electrical activity was acquired at a sampling rate of 500Hz  and reviewed with a high frequency filter of 70Hz  and a low frequency filter of 1Hz . EEG data were recorded continuously and digitally stored.   Description: EEG showed continuous generalized polymorphic 3 to 6 Hz theta-delta slowing with overriding 15 to 18 Hz beta activity distributed symmetrically and diffusely. Hyperventilation and photic stimulation were not performed.     ABNORMALITY - Continuous slow, generalized - Excessive beta, generalized  IMPRESSION: This study is suggestive of moderate to severe diffuse encephalopathy, nonspecific etiology. No seizures or epileptiform discharges were seen throughout the recording.  Dr was notified.  Lauren Velazquez 

## 2021-07-01 NOTE — Progress Notes (Signed)
Initial Nutrition Assessment  DOCUMENTATION CODES:   Morbid obesity  INTERVENTION:   Initiate tube feeding via OG tube: Glucerna 1.5 at 50 ml/h (1200 ml per day) Prosource TF 45 ml daily  Provides 1840 kcal, 110 gm protein, 912 ml free water daily   NUTRITION DIAGNOSIS:   Inadequate oral intake related to inability to eat as evidenced by NPO status.  GOAL:   Patient will meet greater than or equal to 90% of their needs  MONITOR:   TF tolerance  REASON FOR ASSESSMENT:   Ventilator    ASSESSMENT:   Pt with PMH of DM uses insulin, HTN, chronic back pain, HLD, COVID-19, and anxiety admitted 11/6 with AMS and meningitis.   11/6 admitted 11/12 extubated 11/16 seizure with aspiration, re-intubated tx to [facility] for continuous EEG monitoring   Pt discussed during ICU rounds and with RN. Spoke with MD, ok to start nutrition today.   Patient is currently intubated on ventilator support MV: 8.4 L/min Temp (24hrs), Avg:98.2 F (36.8 C), Min:97.2 F (36.2 C), Max:99.4 F (37.4 C)  Medications reviewed and include: colace, SSI, protonix, miralax  Fentanyl  LR @ 125 ml/hr  Versed  Labs reviewed:  CBG's: 111-282   16 F OG tube; tip in stomach   NUTRITION - FOCUSED PHYSICAL EXAM:  Flowsheet Row Most Recent Value  Orbital Region No depletion  Upper Arm Region No depletion  Thoracic and Lumbar Region No depletion  Buccal Region No depletion  Temple Region No depletion  Clavicle Bone Region No depletion  Clavicle and Acromion Bone Region No depletion  Scapular Bone Region No depletion  Dorsal Hand No depletion  Patellar Region No depletion  Anterior Thigh Region No depletion  Posterior Calf Region No depletion  Edema (RD Assessment) Mild  Hair Reviewed  Eyes Unable to assess  Mouth Unable to assess  Skin Reviewed  Nails Reviewed       Diet Order:   Diet Order     None       EDUCATION NEEDS:   Not appropriate for education at this time  Skin:   Skin Assessment:  (Laceration: L labia)  Last BM:  11/16 type 7  Height:   Ht Readings from Last 1 Encounters:  06/30/21 5\' 1"  (1.549 m)    Weight:   Wt Readings from Last 1 Encounters:  07/01/21 100.7 kg    BMI:  Body mass index is 41.95 kg/m.  Estimated Nutritional Needs:   Kcal:  1800-2000  Protein:  100-115 grams  Fluid:  > 1.8 L/day  07/03/21., RD, LDN, CNSC See AMiON for contact information

## 2021-07-01 NOTE — Progress Notes (Signed)
Patient rehooked for EEG after being taken down for MRI @ 1025

## 2021-07-01 NOTE — Progress Notes (Signed)
Requested EEG to take down LTM for MRI

## 2021-07-01 NOTE — Progress Notes (Signed)
Inpatient Diabetes Program Recommendations  AACE/ADA: New Consensus Statement on Inpatient Glycemic Control (2015)  Target Ranges:  Prepandial:   less than 140 mg/dL      Peak postprandial:   less than 180 mg/dL (1-2 hours)      Critically ill patients:  140 - 180 mg/dL   Lab Results  Component Value Date   GLUCAP 213 (H) 07/01/2021   HGBA1C 8.2 (H) 06/20/2021    Review of Glycemic Control  Latest Reference Range & Units 06/30/21 16:06 06/30/21 19:14 07/01/21 00:28 07/01/21 04:31 07/01/21 07:50 07/01/21 12:39  Glucose-Capillary 70 - 99 mg/dL 725 (H) 366 (H) 440 (H) 111 (H) 157 (H) 213 (H)   Diabetes history: DM 2 Outpatient Diabetes medications:  Lantus 40 units bid, Novolog  Current orders for Inpatient glycemic control:  Novolog moderate q 4 hours  Inpatient Diabetes Program Recommendations:    Last dose of Semglee 30 units was on 06/30/21 at 11:52a.  May consider adding Semglee 20 units daily.   Thanks,  Beryl Meager, RN, BC-ADM Inpatient Diabetes Coordinator Pager [phone]  (8a-5p)

## 2021-07-01 NOTE — Progress Notes (Signed)
STAT EEG complete - results pending. ? ?

## 2021-07-01 NOTE — Progress Notes (Signed)
Patient was transported to MRI & back to [id number] without any complications.  

## 2021-07-01 NOTE — Progress Notes (Signed)
Notified MD of decreased U/O.  Awaiting orders.

## 2021-07-01 NOTE — Progress Notes (Signed)
eLink Physician-Brief Progress Note Patient Name: Lauren Velazquez DOB: 09-22-1960 MRN: [id number]   Date of Service  07/01/2021  HPI/Events of Note  Patient is intubated and needs restraints order renewed, in order to prevent self-extubation. Patient is oliguric.   eICU Interventions  Restraints order renewed. NS 500 ml iv fluid bolus ordered.        Thomasene Lot Tyna Huertas 07/01/2021, 9:55 PM

## 2021-07-01 NOTE — Procedures (Addendum)
Patient Name: Lauren Velazquez  MRN: [id number]  Epilepsy Attending: Charlsie Quest  Referring Physician/Provider: Dr. Caryl Pina Duration: 07/01/2021 0042 to 07/02/2021 0042   Patient history: 60 year old female with altered mental status and seizure.  EEG to evaluate for seizure.   Level of alertness:  comatose   AEDs during EEG study: Keppra, Vimpat, versed  Technical aspects: This EEG study was done with scalp electrodes positioned according to the 10-20 International system of electrode placement. Electrical activity was acquired at a sampling rate of 500Hz  and reviewed with a high frequency filter of 70Hz  and a low frequency filter of 1Hz . EEG data were recorded continuously and digitally stored.    Description: EEG showed continuous generalized polymorphic 3 to 6 Hz theta-delta slowing with overriding 15 to 18 Hz beta activity distributed symmetrically and diffusely. Hyperventilation and photic stimulation were not performed.      ABNORMALITY - Continuous slow, generalized - Excessive beta, generalized   IMPRESSION: This study is suggestive of moderate to severe diffuse encephalopathy, nonspecific etiology. No seizures or epileptiform discharges were seen throughout the recording.   [name] 

## 2021-07-01 NOTE — Progress Notes (Signed)
NAME:  Lauren Velazquez, MRN:  [id number], DOB:  [birth year], LOS: 1 ADMISSION DATE:  06/30/2021, INITIAL CONSULTATION DATE:  06/19/2021 REFERRING MD:  Dr. Leonides Schanz Chrisitine, CHIEF COMPLAINT:  Altered Mental Status   Brief Patient Description  60 year old female presenting to [facility] ED from home via EMS for multiple complaints including stroke, chest pain, shortness of breath and pain all over. (Per ED documentation)   ED course: Per ED documentation upon arrival patient was agitated and extremely altered, exhibiting bizarre behavior.  She was screaming and yelling sticking her finger down her throat and attempt to make her self vomit.  While sitting in a recliner began attempting to flip it over eventually requiring physical restraint for her own safety.  ED staff attempted to sedate her as she was kicking and swinging arms at staff and husband.  Her main complaint to the ED provider was chest pain, she stated she was worried that she might have had a heart attack as well as feeling very dehydrated.  She also insisted on being given her chronic pain medication, however she also told staff she will take all her pills and overdose. Patient was placed under IVC.  Per ED documentation, husband denied recreational drug use or any new medications (unclear if this comment was made to EDP or EMS).  While in the ED patient became febrile and sepsis protocol was initiated.  No definitive source of infection noted in CXR/UA, this in combination with severely altered mental status raise concern for meningitis.  Due to patient's increasingly agitated state refractory to benzodiazepines the decision was made by the EDP to mechanically intubate for airway protection in order to provide further sedation so that an LP could be performed safely  Medications given: 50 mg Benadryl, 5 mg Haldol, 2 mg Ativan x2, cefepime/vancomycin/acyclovir, 10 mg of Decadron, total 2.6 L IV fluid bolus, succinylcholine for RSI, 650 mg acetaminophen  for fever Initial Vitals: Severely febrile at 103.9, tachypneic 32, tachycardic 134, hypertensive urgency 232/97 & 95% on room air EKG Interpretation: Date: 06/19/2021, EKG Time: 23:23, Rate: 121, Rhythm: Sinus tachycardia, QRS Axis: RAD, Intervals: Prolonged QTC, ST/T Wave abnormalities: None, Narrative Interpretation: Sinus tachycardia with RAD Chemistry: Na+: 134, K+: 3.9, BUN/Cr.:  9/0.76, Serum CO2/ AG: 28/9 Hematology: WBC: 14.4, Hgb: 12.2,  Normal troponin: 8> 15,  Lactic/ PCT: 4.5/pending, COVID-19 & Influenza A/B: negative ABG: 7.46/32/177/22.8 CXR 06/20/21: mild central vascular congestion CT head wo contrast 06/20/21: no acute intracranial abnormality   On 11/16 Pt had been transferred to the floor and had been intermittently drowsy throughout the day, then had an episode of complete unresponsiveness with foaming at the mouth and posturing with acute hypotension.  She was intubated emergently and transferred to the ICU.   Patient was evaluated by neurology and stat CT and CTA head and neck were negative for acute findings, she was loaded with 4g Keppra and transferred to [facility] hospital for continuous EEG.  Pertinent  Medical History  T2DM HTN Vertigo HLD Anxiety Chronic Back pain COVID-19   Significant Hospital Events: Including procedures, antibiotic start and stop dates in addition to other pertinent events   06/20/21- Admit to ICU  with suspected meningitis s/p mechanical intubation for airway protection in the setting of increased sedation needs and agitation. S/p LP @2 :37 AM. ID consult. Patient started empirically on vancomycin, ceftriaxone, ampicillin and acyclovir.  ID consult. 06/21/21- MRI w/out contrast shows acute infarcts (embolic vs. Hypotensive). Neuro, and psych consulted.  TTE showed normal LVEF, grade I diastolic  dysfunction, no sig valvular abnormalities and no intracardiac clot.  rEEG showed moderate diffuse slowing with subtle superimposed right focal slowing without  epileptiform abnormalities 06/22/21-persistent agitation off of sedation.  Started Depakene for mood stabilization and cyproheptadine for potential serotonergic symptoms. 06/23/21: Remains on the vent and sedated. 06/24/21: Remains on the vent. Neuro eval recs central embolic suspected.  Start ASA 81mg  daily + plavix 75mg  daily x21 days f/b ASA 81mg  daily after that.  Consider TEE 06/25/21: Remains on the vent and sedation.  Status post TEE negative for thrombus or vegetation. 06/26/21: S/p extubation now IVC with sitter at the bedside 06/27/21: Stable on [place] @2L  with fluctuating mental status. Sitter at bedside 06/28/21: Restart home dose of oxycodone for concern of possible withdrawal. EEG per neuro r/o seizure activity. Patient stable, able to be transferred to step down care. PCCM to sign sign 06/29/21: Transferred to Med/surg 06/30/21: Rapid response called for unresponsiveness and hypotension.  Upon arrival to ICU concern for seizure activity.  Intubated for airway protection.  Transferred to [facility] for continuous EEG, PCCM admit  Cultures:  11/6>> CSF- mild pleocytosis. Culture- no growth at 4 days. VDRL- negative.  11/6>> blood cultures- negative (no growth @4d ) 11/6>> MRSA PCR - negative  11/6>> HSV1/2- negative  11/6>> Crypotococcal Ag - negative   11/16 resp culture >   Antimicrobials:  11/6 Vancomycin >> 11/10 11/6 Ampicillin >>11/13 11/6 Ceftriaxone >>11/14 11/6 Acyclovir >>11/9 11/6 Cefepime >> x 1 dose 11/16 unasyn >   Interim History / Subjective:  NAEON. cEEG ongoing.  OBJECTIVE   Blood pressure 113/68, pulse 70, temperature 99.4 F (37.4 C), temperature source Axillary, resp. rate 20, height 5\' 1"  (1.549 m), weight 100.7 kg, last menstrual period 08/15/2008, SpO2 97 %.    Vent Mode: PRVC FiO2 (%):  [40 %-100 %] 40 % Set Rate:  [20 bmp] 20 bmp Vt Set:  [420 mL] 420 mL PEEP:  [5 cmH20] 5 cmH20 Plateau Pressure:  [10 cmH20-16 cmH20] 10 cmH20   Intake/Output Summary  (Last 24 hours) at 07/01/2021 0836 Last data filed at 07/01/2021 0600 Gross per 24 hour  Intake 442.11 ml  Output 500 ml  Net -57.89 ml     General: Adult female, resting in bed, in NAD. Neuro: Sedated, minimally responsive with movement of LUE. HEENT: [place]/AT. Sclerae anicteric. ETT in place. Cardiovascular: RRR, no M/R/G.  Lungs: Respirations even and unlabored.  CTA bilaterally, No W/R/R. Abdomen: BS x 4, soft, NT/ND.  Musculoskeletal: No gross deformities, no edema.  Skin: Intact, warm, no rashes.   Labs/imaging that I havepersonally reviewed  (right click and "Reselect all SmartList Selections" daily)  CXR 06/20/21: mild central vascular congestion CT head wo contrast 06/20/21: no acute intracranial abnormality CT abd/pelvis 06/20/21: No evidence of inflammatory process or abscess. Evidence of cholelithiasis, bilateral nephrolithiasis, 5.4 cm benign-appearing left adnexal mass.   CSF 06/20/21: mild pleocytosis MR brain w/out 06/21/21: Scattered punctate acute infarcts in both cerebral hemispheres (bilateral PCA and left MCA territories). No associated hemorrhage or mass effect. Although the posterior fossa and anterior cerebral artery territories are spared, the pattern is suspicious for a recent embolic event. Hypotensive episode might also produce this appearance. 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  Echo 06/21/21: EF 60-65%, otherwise normal.  EEG 06/21/21: moderate diffuse slowing, focal right sided slowing, no epileptiform abnormalities.  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 Wide patency of the left carotid artery system within the neck. Widely patent vertebral arteries within the neck. TEE 06/25/2021: negative for thrombus or vegetation  CTA Head and Neck 11/16: No acute intracranial process or significant  stenosis   ASSESSMENT & PLAN  60 y/o female admitted in the setting of acute toxic encephalopathy in the setting of acute embolic strokes and possible seizure.  There was concern for a meningitis picture, but everything seems most consistent with the strokes as the cause of her confusion and subsequent seizures. Developed recurrent seizure on 11/16 and was post ictal afterwards and aspirated and required intubation.  Acute Hypoxic Respiratory Failure - 2/2 aspiration and post ictal state after seizure event 11/16. - Continue full vent support. - Wean sedation as able depending on cEEG findings. - Continue empiric Unasyn and follow cultures. - Follow CXR.  Seizure activity with post ictal state and acute encephalopathy - repeat head and neck CTA were negative. - Continue cEEG per neuro. - AED's per neuro (Keppra, Vimpat). - F/u on brain MRI.  Acute bilateral punctate infarcts (embolic vs. hypotensive episode) - Scattered punctate acute infarcts in both cerebral hemispheres, Seen on MRI 11/7 TEE negative. -  Per neurology - Continue ASA.  Poorly controlled Type 2 Diabetes Mellitus -SSI  Best practice (right click and "Reselect all SmartList Selections" daily)  Diet:  NPO, start TF's 11/18 if remains intubated Pain/Anxiety/Delirium protocol (if indicated): Yes (RASS goal 0) VAP protocol (if indicated): Yes DVT prophylaxis: LMWH GI prophylaxis: H2B Glucose control:  SSI Yes Central venous access:  N/A Arterial line:  NO Foley:  Yes, and it is still needed Mobility:  bed rest  PT consulted:  Last date of multidisciplinary goals of care discussion [06/26/21] Code Status:  full code Disposition: ICU  CC time: 35 min.   Rutherford Guys, PA - [facility] For pager details, please see AMION or use Epic chat  After 1900, please call [facility] for cross coverage needs 07/01/2021, 8:48 AM

## 2021-07-01 NOTE — Progress Notes (Signed)
Discussed foley with CCM. Pt with low output, being monitored hourly in setting of acute decompensation < 24 hr ago. Keep foley for now.

## 2021-07-01 NOTE — Progress Notes (Signed)
Subjective: No significant changes overnight EEG without seizures  Exam: Vitals:   07/01/21 1700 07/01/21 1800  BP: (!) 110/58 118/66  Pulse: 62 79  Resp: 20 16  Temp:    SpO2: 96% 97%   Gen: In bed, NAD Resp: non-labored breathing, no acute distress Abd: soft, nt  Neuro: MS: Awakens to mild stimulation, she localizes bilaterally but does not follow commands CN: Pupils reactive to light bilaterally, EOMI Motor: Moves all extremities to noxious stimulation Sensory: As above   Pertinent Labs: Creatinine 1.25, increased from 0.45  Impression: 60 year old female with two episodes of sudden altered mental status, of unclear etiology.  EEG is negative for ongoing seizures.  She appears to be clinically improved compared to yesterday.  Possibilities include recurrent seizure activity, hypoperfusion event, embolic shower.  I would continue her continuous EEG in case she has another event, but would also obtain MRI today.  Recommendations: 1) MRI brain 2) retook LTM EEG after MRI 3) continue Vimpat and Keppra for now  This patient is critically ill and at significant risk of neurological worsening, death and care requires constant monitoring of vital signs, hemodynamics,respiratory and cardiac monitoring, neurological assessment, discussion with family, other specialists and medical decision making of high complexity. I spent 35 minutes of neurocritical care time  in the care of  this patient. This was time spent independent of any time provided by nurse practitioner or PA.  Ritta Slot, MD [facility] [phone]  If 7pm- 7am, please page neurology on call as listed in AMION. 07/01/2021  7:23 PM

## 2021-07-01 NOTE — Progress Notes (Signed)
Pt transported to MRI under cont monitoring by RN and RT

## 2021-07-01 NOTE — Progress Notes (Signed)
Returned from MRI 

## 2021-07-01 NOTE — Progress Notes (Signed)
vLTM started  all impedances are below 10 kohms.  [facility] to monitor   Patient event button tested 

## 2021-07-01 NOTE — Progress Notes (Signed)
Updated daughter Anne Ng on plan of care.

## 2021-07-02 ENCOUNTER — Inpatient Hospital Stay (HOSPITAL_COMMUNITY): Payer: BLUE CROSS/BLUE SHIELD

## 2021-07-02 DIAGNOSIS — G9340 Encephalopathy, unspecified: Secondary | ICD-10-CM | POA: Diagnosis not present

## 2021-07-02 DIAGNOSIS — J9601 Acute respiratory failure with hypoxia: Secondary | ICD-10-CM

## 2021-07-02 DIAGNOSIS — I6310 Cerebral infarction due to embolism of unspecified precerebral artery: Secondary | ICD-10-CM | POA: Diagnosis not present

## 2021-07-02 DIAGNOSIS — I63413 Cerebral infarction due to embolism of bilateral middle cerebral arteries: Secondary | ICD-10-CM

## 2021-07-02 DIAGNOSIS — I639 Cerebral infarction, unspecified: Secondary | ICD-10-CM

## 2021-07-02 DIAGNOSIS — R0689 Other abnormalities of breathing: Secondary | ICD-10-CM | POA: Diagnosis not present

## 2021-07-02 DIAGNOSIS — R569 Unspecified convulsions: Secondary | ICD-10-CM | POA: Diagnosis not present

## 2021-07-02 LAB — CBC WITH DIFFERENTIAL/PLATELET
Abs Immature Granulocytes: 0.06 10*3/uL (ref 0.00–0.07)
Basophils Absolute: 0.1 10*3/uL (ref 0.0–0.1)
Basophils Relative: 1 %
Eosinophils Absolute: 0 10*3/uL (ref 0.0–0.5)
Eosinophils Relative: 0 %
HCT: 31 % — ABNORMAL LOW (ref 36.0–46.0)
Hemoglobin: 8.9 g/dL — ABNORMAL LOW (ref 12.0–15.0)
Immature Granulocytes: 1 %
Lymphocytes Relative: 10 %
Lymphs Abs: 1 10*3/uL (ref 0.7–4.0)
MCH: 24.3 pg — ABNORMAL LOW (ref 26.0–34.0)
MCHC: 28.7 g/dL — ABNORMAL LOW (ref 30.0–36.0)
MCV: 84.7 fL (ref 80.0–100.0)
Monocytes Absolute: 0.5 10*3/uL (ref 0.1–1.0)
Monocytes Relative: 5 %
Neutro Abs: 8 10*3/uL — ABNORMAL HIGH (ref 1.7–7.7)
Neutrophils Relative %: 83 %
Platelets: 407 10*3/uL — ABNORMAL HIGH (ref 150–400)
RBC: 3.66 MIL/uL — ABNORMAL LOW (ref 3.87–5.11)
RDW: 17.2 % — ABNORMAL HIGH (ref 11.5–15.5)
WBC: 9.6 10*3/uL (ref 4.0–10.5)
nRBC: 0.2 % (ref 0.0–0.2)

## 2021-07-02 LAB — GLUCOSE, CAPILLARY
Glucose-Capillary: 213 mg/dL — ABNORMAL HIGH (ref 70–99)
Glucose-Capillary: 224 mg/dL — ABNORMAL HIGH (ref 70–99)
Glucose-Capillary: 239 mg/dL — ABNORMAL HIGH (ref 70–99)
Glucose-Capillary: 242 mg/dL — ABNORMAL HIGH (ref 70–99)
Glucose-Capillary: 254 mg/dL — ABNORMAL HIGH (ref 70–99)
Glucose-Capillary: 256 mg/dL — ABNORMAL HIGH (ref 70–99)

## 2021-07-02 LAB — BASIC METABOLIC PANEL
Anion gap: 7 (ref 5–15)
BUN: 17 mg/dL (ref 6–20)
CO2: 25 mmol/L (ref 22–32)
Calcium: 8.7 mg/dL — ABNORMAL LOW (ref 8.9–10.3)
Chloride: 108 mmol/L (ref 98–111)
Creatinine, Ser: 0.67 mg/dL (ref 0.44–1.00)
GFR, Estimated: >60 mL/min (ref >60)
Glucose, Bld: 260 mg/dL — ABNORMAL HIGH (ref 70–99)
Potassium: 4.4 mmol/L (ref 3.5–5.1)
Sodium: 140 mmol/L (ref 135–145)

## 2021-07-02 LAB — ANTITHROMBIN III: AntiThromb III Func: 102 % (ref 75–120)

## 2021-07-02 LAB — MAGNESIUM: Magnesium: 2.5 mg/dL — ABNORMAL HIGH (ref 1.7–2.4)

## 2021-07-02 LAB — PHOSPHORUS: Phosphorus: 3 mg/dL (ref 2.5–4.6)

## 2021-07-02 MED ORDER — HYDRALAZINE HCL 20 MG/ML IJ SOLN
10.0000 mg | INTRAMUSCULAR | Status: DC | PRN
  Administered 2021-07-02 (×2): 10 mg via INTRAVENOUS
  Filled 2021-07-02 (×2): qty 1

## 2021-07-02 MED ORDER — INSULIN ASPART 100 UNIT/ML IJ SOLN
0.0000 [IU] | INTRAMUSCULAR | Status: DC
Start: 2021-07-02 — End: 2021-07-07
  Administered 2021-07-02: 7 [IU] via SUBCUTANEOUS
  Administered 2021-07-02: 11 [IU] via SUBCUTANEOUS
  Administered 2021-07-02 – 2021-07-03 (×5): 7 [IU] via SUBCUTANEOUS
  Administered 2021-07-03: 11 [IU] via SUBCUTANEOUS
  Administered 2021-07-03 – 2021-07-04 (×3): 7 [IU] via SUBCUTANEOUS
  Administered 2021-07-04: 11 [IU] via SUBCUTANEOUS
  Administered 2021-07-04 (×3): 7 [IU] via SUBCUTANEOUS
  Administered 2021-07-04 – 2021-07-05 (×7): 4 [IU] via SUBCUTANEOUS
  Administered 2021-07-06: 3 [IU] via SUBCUTANEOUS
  Administered 2021-07-06 (×3): 4 [IU] via SUBCUTANEOUS
  Administered 2021-07-07: 15 [IU] via SUBCUTANEOUS
  Administered 2021-07-07: 3 [IU] via SUBCUTANEOUS
  Administered 2021-07-07 (×2): 4 [IU] via SUBCUTANEOUS

## 2021-07-02 MED ORDER — HYDRALAZINE HCL 20 MG/ML IJ SOLN: 10.0000 mg | INTRAMUSCULAR | Status: DC | PRN

## 2021-07-02 MED ORDER — AMLODIPINE BESYLATE 10 MG PO TABS
10.0000 mg | ORAL_TABLET | Freq: Every day | ORAL | Status: DC
  Administered 2021-07-02 – 2021-07-05 (×4): 10 mg
  Filled 2021-07-02 (×4): qty 1

## 2021-07-02 MED ORDER — HYDRALAZINE HCL 20 MG/ML IJ SOLN
10.0000 mg | INTRAMUSCULAR | Status: DC | PRN
Start: 2021-07-02 — End: 2021-07-20
  Administered 2021-07-02: 40 mg via INTRAVENOUS
  Administered 2021-07-03 – 2021-07-05 (×3): 20 mg via INTRAVENOUS
  Filled 2021-07-02 (×3): qty 1
  Filled 2021-07-02 (×2): qty 2

## 2021-07-02 MED ORDER — LISINOPRIL 20 MG PO TABS: 20.0000 mg | ORAL_TABLET | Freq: Two times a day (BID) | ORAL | Status: DC

## 2021-07-02 MED ORDER — UMECLIDINIUM-VILANTEROL 62.5-25 MCG/ACT IN AEPB
1.0000 | INHALATION_SPRAY | Freq: Every day | RESPIRATORY_TRACT | Status: DC
  Administered 2021-07-06 – 2021-07-20 (×11): 1 via RESPIRATORY_TRACT
  Filled 2021-07-02 (×2): qty 14

## 2021-07-02 MED ORDER — QUETIAPINE FUMARATE 25 MG PO TABS
50.0000 mg | ORAL_TABLET | Freq: Every day | ORAL | Status: DC
  Administered 2021-07-02 – 2021-07-04 (×3): 50 mg
  Filled 2021-07-02 (×3): qty 2

## 2021-07-02 MED ORDER — METOPROLOL TARTRATE 5 MG/5ML IV SOLN
5.0000 mg | Freq: Once | INTRAVENOUS | Status: AC
  Administered 2021-07-02: 5 mg via INTRAVENOUS
  Filled 2021-07-02: qty 5

## 2021-07-02 MED ORDER — LABETALOL HCL 5 MG/ML IV SOLN
10.0000 mg | INTRAVENOUS | Status: AC | PRN
  Administered 2021-07-02 – 2021-07-03 (×3): 10 mg via INTRAVENOUS
  Filled 2021-07-02 (×2): qty 4

## 2021-07-02 MED ORDER — LABETALOL HCL 5 MG/ML IV SOLN
10.0000 mg | INTRAVENOUS | Status: DC | PRN
  Administered 2021-07-02 (×2): 10 mg via INTRAVENOUS
  Filled 2021-07-02 (×2): qty 4

## 2021-07-02 MED ORDER — ALBUTEROL SULFATE (2.5 MG/3ML) 0.083% IN NEBU: 2.5000 mg | INHALATION_SOLUTION | RESPIRATORY_TRACT | Status: DC | PRN

## 2021-07-02 MED ORDER — LISINOPRIL 20 MG PO TABS
20.0000 mg | ORAL_TABLET | Freq: Two times a day (BID) | ORAL | Status: DC
  Administered 2021-07-02 – 2021-07-03 (×2): 20 mg
  Filled 2021-07-02 (×2): qty 1

## 2021-07-02 MED ORDER — LABETALOL HCL 5 MG/ML IV SOLN
10.0000 mg | INTRAVENOUS | Status: AC | PRN
  Administered 2021-07-02: 10 mg via INTRAVENOUS

## 2021-07-02 MED ORDER — EZETIMIBE 10 MG PO TABS
10.0000 mg | ORAL_TABLET | Freq: Every day | ORAL | Status: DC
  Administered 2021-07-03 – 2021-07-05 (×3): 10 mg
  Filled 2021-07-02 (×4): qty 1

## 2021-07-02 MED ORDER — AMLODIPINE BESYLATE 10 MG PO TABS: 10.0000 mg | ORAL_TABLET | Freq: Every day | ORAL | Status: DC

## 2021-07-02 MED ORDER — FUROSEMIDE 10 MG/ML IJ SOLN
60.0000 mg | Freq: Once | INTRAMUSCULAR | Status: AC
  Administered 2021-07-02: 60 mg via INTRAVENOUS
  Filled 2021-07-02: qty 6

## 2021-07-02 MED ORDER — METOPROLOL TARTRATE 5 MG/5ML IV SOLN
INTRAVENOUS | Status: AC
  Filled 2021-07-02: qty 5

## 2021-07-02 MED ORDER — BUDESONIDE 0.5 MG/2ML IN SUSP
1.0000 mg | Freq: Two times a day (BID) | RESPIRATORY_TRACT | Status: DC
  Administered 2021-07-02 – 2021-07-07 (×10): 1 mg via RESPIRATORY_TRACT
  Administered 2021-07-08: 0.5 mg via RESPIRATORY_TRACT
  Administered 2021-07-08 – 2021-07-12 (×2): 1 mg via RESPIRATORY_TRACT
  Administered 2021-07-13 – 2021-07-16 (×2): 0.5 mg via RESPIRATORY_TRACT
  Filled 2021-07-02 (×28): qty 4

## 2021-07-02 MED ORDER — EZETIMIBE 10 MG PO TABS: 10.0000 mg | ORAL_TABLET | Freq: Every day | ORAL | Status: DC

## 2021-07-02 NOTE — Progress Notes (Signed)
Nutrition Follow-up  DOCUMENTATION CODES:   Morbid obesity  INTERVENTION:   If pt unable to swallow recommend Cortrak tube  Initiate tube feeding via Cortrak tube: Glucerna 1.5 at 50 ml/h (1200 ml per day) Prosource TF 45 ml daily  Provides 1840 kcal, 110 gm protein, 912 ml free water daily   NUTRITION DIAGNOSIS:   Inadequate oral intake related to inability to eat as evidenced by NPO status. Ongoing.   GOAL:   Patient will meet greater than or equal to 90% of their needs Progressing.   MONITOR:   TF tolerance  REASON FOR ASSESSMENT:   Ventilator    ASSESSMENT:   Pt with PMH of DM uses insulin, HTN, chronic back pain, HLD, COVID-19, and anxiety admitted 11/6 with AMS and meningitis.   11/6 admitted 11/12 extubated 11/16 seizure with aspiration, re-intubated tx to [facility] for continuous EEG monitoring 11/18 extubated  Pt discussed during ICU rounds and with RN.  Noted outpatient regimen  70 units Lantus BID with 20 units novolog TID with meals   Medications reviewed and include: colace,  LR @ 50 ml/hr  Keppra  Labs reviewed:  CBG's: 213-254   Diet Order:   Diet Order     None       EDUCATION NEEDS:   Not appropriate for education at this time  Skin:  Skin Assessment:  (Laceration: L labia)  Last BM:  11/16 type 7  Height:   Ht Readings from Last 1 Encounters:  06/30/21 5\' 1"  (1.549 m)    Weight:   Wt Readings from Last 1 Encounters:  07/02/21 109 kg    BMI:  Body mass index is 45.4 kg/m.  Estimated Nutritional Needs:   Kcal:  1800-2000  Protein:  100-115 grams  Fluid:  > 1.8 L/day  07/04/21., RD, LDN, CNSC See AMiON for contact information

## 2021-07-02 NOTE — Progress Notes (Signed)
Reapplied P3, P7, O1, A1.  Reapplied EKG leads.

## 2021-07-02 NOTE — Progress Notes (Signed)
Pt's BP's >180. Pt continues to move while taking BP's. PRNs have been given. MD Xu aware. New orders placed by MD.

## 2021-07-02 NOTE — Progress Notes (Signed)
NAME:  Julana Cambridge, MRN:  [id number], DOB:  [birth year], LOS: 2 ADMISSION DATE:  06/30/2021, INITIAL CONSULTATION DATE:  06/19/2021 REFERRING MD:  Dr. Leonides Schanz Chrisitine, CHIEF COMPLAINT:  Altered Mental Status   Brief Patient Description  60 year old female presenting to [facility] ED from home via EMS for multiple complaints including stroke, chest pain, shortness of breath and pain all over. (Per ED documentation)   ED course: Per ED documentation upon arrival patient was agitated and extremely altered, exhibiting bizarre behavior.  She was screaming and yelling sticking her finger down her throat and attempt to make her self vomit.  While sitting in a recliner began attempting to flip it over eventually requiring physical restraint for her own safety.  ED staff attempted to sedate her as she was kicking and swinging arms at staff and husband.  Her main complaint to the ED provider was chest pain, she stated she was worried that she might have had a heart attack as well as feeling very dehydrated.  She also insisted on being given her chronic pain medication, however she also told staff she will take all her pills and overdose. Patient was placed under IVC.  Per ED documentation, husband denied recreational drug use or any new medications (unclear if this comment was made to EDP or EMS).  While in the ED patient became febrile and sepsis protocol was initiated.  No definitive source of infection noted in CXR/UA, this in combination with severely altered mental status raise concern for meningitis.  Due to patient's increasingly agitated state refractory to benzodiazepines the decision was made by the EDP to mechanically intubate for airway protection in order to provide further sedation so that an LP could be performed safely  Medications given: 50 mg Benadryl, 5 mg Haldol, 2 mg Ativan x2, cefepime/vancomycin/acyclovir, 10 mg of Decadron, total 2.6 L IV fluid bolus, succinylcholine for RSI, 650 mg acetaminophen  for fever Initial Vitals: Severely febrile at 103.9, tachypneic 32, tachycardic 134, hypertensive urgency 232/97 & 95% on room air EKG Interpretation: Date: 06/19/2021, EKG Time: 23:23, Rate: 121, Rhythm: Sinus tachycardia, QRS Axis: RAD, Intervals: Prolonged QTC, ST/T Wave abnormalities: None, Narrative Interpretation: Sinus tachycardia with RAD Chemistry: Na+: 134, K+: 3.9, BUN/Cr.:  9/0.76, Serum CO2/ AG: 28/9 Hematology: WBC: 14.4, Hgb: 12.2,  Normal troponin: 8> 15,  Lactic/ PCT: 4.5/pending, COVID-19 & Influenza A/B: negative ABG: 7.46/32/177/22.8 CXR 06/20/21: mild central vascular congestion CT head wo contrast 06/20/21: no acute intracranial abnormality   On 11/16 Pt had been transferred to the floor and had been intermittently drowsy throughout the day, then had an episode of complete unresponsiveness with foaming at the mouth and posturing with acute hypotension.  She was intubated emergently and transferred to the ICU.   Patient was evaluated by neurology and stat CT and CTA head and neck were negative for acute findings, she was loaded with 4g Keppra and transferred to [facility] hospital for continuous EEG.  Pertinent  Medical History  T2DM HTN Vertigo HLD Anxiety Chronic Back pain COVID-19   Significant Hospital Events: Including procedures, antibiotic start and stop dates in addition to other pertinent events   06/20/21- Admit to ICU  with suspected meningitis s/p mechanical intubation for airway protection in the setting of increased sedation needs and agitation. S/p LP @2 :37 AM. ID consult. Patient started empirically on vancomycin, ceftriaxone, ampicillin and acyclovir.  ID consult. 06/21/21- MRI w/out contrast shows acute infarcts (embolic vs. Hypotensive). Neuro, and psych consulted.  TTE showed normal LVEF, grade I diastolic  dysfunction, no sig valvular abnormalities and no intracardiac clot.  rEEG showed moderate diffuse slowing with subtle superimposed right focal slowing without  epileptiform abnormalities 06/22/21-persistent agitation off of sedation.  Started Depakene for mood stabilization and cyproheptadine for potential serotonergic symptoms. 06/23/21: Remains on the vent and sedated. 06/24/21: Remains on the vent. Neuro eval recs central embolic suspected.  Start ASA 81mg  daily + plavix 75mg  daily x21 days f/b ASA 81mg  daily after that.  Consider TEE 06/25/21: Remains on the vent and sedation.  Status post TEE negative for thrombus or vegetation. 06/26/21: S/p extubation now IVC with sitter at the bedside 06/27/21: Stable on [place] @2L  with fluctuating mental status. Sitter at bedside 06/28/21: Restart home dose of oxycodone for concern of possible withdrawal. EEG per neuro r/o seizure activity. Patient stable, able to be transferred to step down care. PCCM to sign sign 06/29/21: Transferred to Med/surg 06/30/21: Rapid response called for unresponsiveness and hypotension.  Upon arrival to ICU concern for seizure activity.  Intubated for airway protection.  Transferred to [facility] for continuous EEG, PCCM admit 11/17 no further seizures. More scattered punctate infarcts on MRI compared to 11/7  11/18 weaning on vent   Cultures:  11/6>> CSF- mild pleocytosis. Culture- no growth at 4 days. VDRL- negative.  11/6>> blood cultures- negative (no growth @4d ) 11/6>> MRSA PCR - negative  11/6>> HSV1/2- negative  11/6>> Crypotococcal Ag - negative   11/16 resp culture >   Antimicrobials:  11/6 Vancomycin >> 11/10 11/6 Ampicillin >>11/13 11/6 Ceftriaxone >>11/14 11/6 Acyclovir >>11/9 11/6 Cefepime >> x 1 dose 11/16 unasyn >   Interim History / Subjective:  NAEO  On 186fent 5 midaz this morning UOP has decreased -- only 578 out 11/17  Yesterday MRI brain revealed several new areas bilateral punctate infarct compared to prior -- acute and subacute   OBJECTIVE   Blood pressure (!) 146/84, pulse 83, temperature 98.2 F (36.8 C), temperature source Oral, resp. rate (!) 28,  height 5\' 1"  (1.549 m), weight 109 kg, last menstrual period 08/15/2008, SpO2 98 %.    Vent Mode: PRVC FiO2 (%):  [40 %] 40 % Set Rate:  [20 bmp] 20 bmp Vt Set:  [420 mL] 420 mL PEEP:  [5 cmH20] 5 cmH20 Plateau Pressure:  [16 cmH20-20 cmH20] 16 cmH20   Intake/Output Summary (Last 24 hours) at 07/02/2021 1009 Last data filed at 07/02/2021 0900 Gross per 24 hour  Intake 4558.06 ml  Output 2218 ml  Net 2340.06 ml    General: Obese critically and chronically ill appearing middle aged F intubated lightly sedated  Neuro: Lightly sedated awakens to voice following commands. Moving BUE BLE spontaneously.  HEENT: NCAT pink mm anicteric sclera ETT secure redundant neck tissue  Cardiovascular: rrr s1s2 no rgm  Lungs: Upper lobe expiratory rhonchi. Even unlabored on PSV. Symmetrical chest expansion  Abdomen: obese soft round ndnt Musculoskeletal: No acute joint deformity no cyanosis or clubbing Skin: c/d/w. Flushed face  Psych: intermittently agitated psychomotor movements    Labs/imaging that I havepersonally reviewed  (right click and "Reselect all SmartList Selections" daily)  CXR 06/20/21: mild central vascular congestion CT head wo contrast 06/20/21: no acute intracranial abnormality CT abd/pelvis 06/20/21: No evidence of inflammatory process or abscess. Evidence of cholelithiasis, bilateral nephrolithiasis, 5.4 cm benign-appearing left adnexal mass.   CSF 06/20/21: mild pleocytosis MR brain w/out 06/21/21: Scattered punctate acute infarcts in both cerebral hemispheres (bilateral PCA and left MCA territories). No associated hemorrhage or mass effect. Although the posterior fossa and  anterior cerebral artery territories are spared, the pattern is suspicious for a recent embolic event. Hypotensive episode might also produce this appearance. 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  Echo 06/21/21: EF 60-65%, otherwise  normal.  EEG 06/21/21: moderate diffuse slowing, focal right sided slowing, no epileptiform abnormalities.  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 Wide patency of the left carotid artery system within the neck. Widely patent vertebral arteries within the neck. TEE 06/25/2021: negative for thrombus or vegetation  CTA Head and Neck 11/16: No acute intracranial process or significant stenosis MRI Brain 11/17> scattered acute and subacute bilateral infarcts, increased in number from 11/7 ASSESSMENT & PLAN    Acute metabolic encephalopathy -Medication related, neuro processes.  -wasx tx for meningitis at [facility] P -correct metabolic abnormalities as able -wean sedation, RASS goal 0  Acute and subacute bilateral punctate infarcts  -MRI 11/17 with many new areas of infarct compared to prior 11/7.  -Embolic shower vs hypotension-- TEE 11/11 was neg  P -neuro following appreciate recs -ASA   Possible seizure with post-ictal state -No further sz captured on cEEG  P - AEDs per neuro  Acute hypoxic respiratory failure Concern for Aspiration  Restrictive lung disease, workup ongoing (had initial pulm appointment 05/2021 for post covid dyspnea. ome trelegy ellipta and PRN albuterol. Spiros suggest mild restriction) Suspected OSA  -in setting of suspected seizure, post ictal state, poor airway protection  P - WUA/SBT  -extubate this morning  -supplemental O2 as needed -IS - Continue empiric Unasyn and follow cultures -adding pulmicort, anoro ellipta and PRN albuterol   HTN -PRN Hydral and Labetalol -1x Lasix as below  Decreased UOP -borderline oliguria. Renal indices normal. Appears slightly volume up on exam and has been on mIVF, some boluses P -60mg  IV lasix  -decr mIVF  -trend renal indices uop   DM2, poorly controlled  P -SSI-- increase to resistant scale   Best practice (right click and  "Reselect all SmartList Selections" daily)  Diet:  NPO Pain/Anxiety/Delirium protocol (if indicated): n/a VAP protocol (if indicated): Yes DVT prophylaxis: LMWH GI prophylaxis: H2B Glucose control:  SSI Yes Central venous access:  N/A Arterial line:  NO Foley:  Yes, and it is still needed  Mobility:  bed rest  PT consulted:  Last date of multidisciplinary goals of care discussion [06/26/21] Code Status:  full code Disposition: ICU  CRITICAL CARE Performed by: 13/12/22   Total critical care time: 58 minutes  Critical care time was exclusive of separately billable procedures and treating other patients. Critical care was necessary to treat or prevent imminent or life-threatening deterioration.  Critical care was time spent personally by me on the following activities: development of treatment plan with patient and/or surrogate as well as nursing, discussions with consultants, evaluation of patient's response to treatment, examination of patient, obtaining history from patient or surrogate, ordering and performing treatments and interventions, ordering and review of laboratory studies, ordering and review of radiographic studies, pulse oximetry and re-evaluation of patient's condition.  Lanier Clam MSN, AGACNP-BC [facility] Pulmonary/Critical Care Medicine Amion for pager  07/02/2021, 10:09 AM

## 2021-07-02 NOTE — Progress Notes (Signed)
LTM maint complete - no skin breakdown under: Fp2 F4 A2 Maintenance Cz P7 O1 T7 Pz Fz [facility] monitored, Event button test confirmed by [facility].

## 2021-07-02 NOTE — Progress Notes (Signed)
Attempting to perform stroke swallow screen on patient. Pt unwilling to cooperate and follow commands requested by RN. Pt repeats "you're an asshole" repeatedly. Called pt's daughter in attempt to assist in directing pt to perform swallow screen. Pt continues to state "she's an asshole."  Informed pt why I was asking her to perform the tasks requested and explained several times the purpose of my requests. Will attempt at a later time.

## 2021-07-02 NOTE — Evaluation (Signed)
Occupational Therapy Evaluation Patient Details Name: Lauren Velazquez MRN: [id number] DOB: [birth year] Today's Date: 07/02/2021   History of Present Illness 60 yo female presenting to [facility] ED on 11/6 with AMS with agitation; suspect viral meningitis. Intubated 11/6-11/12 for airway protection after sedation. Transfered to [facility] on 11/15. EEG negative for ongoing seizures. Rapis response called for unresponsiveness and hypotension on 11/16. 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 PMH including HTN, T2DM, vertigo, anxiety, chronic back pain, and HLD.   Clinical Impression   Per chart review, pt was living her husband and was performing BADLs. Pt currently requiring Max-Total A for ADLs and bed mobility due to cognition. Pt tolerating sitting at EOB for ~5 minutes before becoming fatigues and attending to lay back in bed. Following ~25% of simple commands with increased time. Pt hallucinating bedbugs in her bed and urine flowing down the fall. Pt would benefit from further acute OT to facilitate safe dc. Recommend dc to CIR for further OT to optimize safety, independence with ADLs, and return to PLOF.      Recommendations for follow up therapy are one component of a multi-disciplinary discharge planning process, led by the attending physician.  Recommendations may be updated based on patient status, additional functional criteria and insurance authorization.   Follow Up Recommendations  Acute inpatient rehab (3hours/day)    Assistance Recommended at Discharge Frequent or constant Supervision/Assistance  Functional Status Assessment  Patient has had a recent decline in their functional status and demonstrates the ability to make significant improvements in function in a reasonable and predictable amount of time.  Equipment Recommendations  Other (comment) (defer to next venue of care)    Recommendations for Other  Services Rehab consult;PT consult;Speech consult     Precautions / Restrictions Precautions Precautions: Fall      Mobility Bed Mobility Overal bed mobility: Needs Assistance Bed Mobility: Supine to Sit;Sit to Supine     Supine to sit: Max assist;+2 for physical assistance;+2 for safety/equipment;HOB elevated Sit to supine: +2 for physical assistance;+2 for safety/equipment;Total assist   General bed mobility comments: Max A +2 to bringing Bles towards EOB and then elevating trunk. Total A for reutrn ot supine    Transfers                   General transfer comment: Defer due to safety      Balance Overall balance assessment: Needs assistance Sitting-balance support: No upper extremity supported;Feet supported Sitting balance-Leahy Scale: Fair Sitting balance - Comments: Able to maintain static sitting. Very close [employer] A for safety and cognition                                   ADL either performed or assessed with clinical judgement   ADL Overall ADL's : Needs assistance/impaired Eating/Feeding: NPO                                     General ADL Comments: Max A for ADLs at bed level due to cognition and safety.     Vision Baseline Vision/History: 1 Wears glasses Patient Visual Report: No change from baseline       Perception     Praxis      Pertinent Vitals/Pain Pain Assessment: Faces Faces Pain Scale:  Hurts little more Pain Location: Generalized Pain Descriptors / Indicators: Aching;Discomfort;Grimacing Pain Intervention(s): Monitored during session;Limited activity within patient's tolerance;Repositioned     Hand Dominance     Extremity/Trunk Assessment Upper Extremity Assessment Upper Extremity Assessment: Difficult to assess due to impaired cognition RUE Deficits / Details: Moving arms against gravity but becoming fatigued quickly RUE Coordination: decreased gross motor LUE Deficits / Details: Moving  arms against gravity but becoming fatigued quickly LUE Coordination: decreased gross motor   Lower Extremity Assessment Lower Extremity Assessment: Defer to PT evaluation   Cervical / Trunk Assessment Cervical / Trunk Assessment: Kyphotic   Communication Communication Communication: No difficulties   Cognition Arousal/Alertness: Lethargic Behavior During Therapy: Restless;Flat affect;Impulsive Overall Cognitive Status: Difficult to assess                                 General Comments: Pt hallucinating "bedbugs" and stating she can see them all over her bed. Pt also stating she can see urine pour down the walls. Pt following 30% of simple cues. Flucuating between cooroporative and paranoid. Poor ST memory. Reseverating on seeing her husband. Only oriented to self     General Comments  SpO2 and HR stable on 3L. BP elevated; taking BP on RLE. Notified RN.    Exercises     Shoulder Instructions      Home Living Family/patient expects to be discharged to:: Private residence Living Arrangements: Spouse/significant other Available Help at Discharge: Family;Available 24 hours/day Type of Home: House Home Access: Stairs to enter [employer] of Steps: 3 Entrance Stairs-Rails: Right Home Layout: One level     Bathroom Shower/Tub: Tub/shower unit         Home Equipment: Firefighter   Additional Comments: Above information from last admission. Pt unable to provide information due to cognition and orietation status.      Prior Functioning/Environment Prior Level of Function : Independent/Modified Independent             Mobility Comments: Per chart review, Pt notes indep without AD for functional mobility. ADLs Comments: Per cahrt review, pt drives, use to work and recently lost her job, indep w/ ADLs. on 3L home O2        OT Problem List: Decreased strength;Decreased activity tolerance;Impaired balance (sitting and/or  standing);Decreased cognition;Decreased safety awareness      OT Treatment/Interventions: Self-care/ADL training;Therapeutic exercise;Energy conservation;DME and/or AE instruction;Therapeutic activities;Patient/family education;Balance training    OT Goals(Current goals can be found in the care plan section) Acute Rehab OT Goals Patient Stated Goal: Unstated OT Goal Formulation: Patient unable to participate in goal setting Time For Goal Achievement: 07/16/21 Potential to Achieve Goals: Fair  OT Frequency: Min 2X/week   Barriers to D/C:            Co-evaluation PT/OT/SLP Co-Evaluation/Treatment: Yes Reason for Co-Treatment: For patient/therapist safety;To address functional/ADL transfers   OT goals addressed during session: ADL's and self-care      AM-PAC OT "6 Clicks" Daily Activity     Outcome Measure Help from another person eating meals?: Total Help from another person taking care of personal grooming?: A Lot Help from another person toileting, which includes using toliet, bedpan, or urinal?: A Lot Help from another person bathing (including washing, rinsing, drying)?: A Lot Help from another person to put on and taking off regular upper body clothing?: A Lot Help from another person to put on and taking off regular lower body  clothing?: Total 6 Click Score: 10   End of Session Equipment Utilized During Treatment: Oxygen Nurse Communication: Mobility status  Activity Tolerance: Treatment limited secondary to agitation Patient left: in bed;with call bell/phone within reach;with bed alarm set;with restraints reapplied;with nursing/sitter in room  OT Visit Diagnosis: Unsteadiness on feet (R26.81);Muscle weakness (generalized) (M62.81)                Time: 8828-0034 OT Time Calculation (min): 14 min Charges:  OT General Charges $OT Visit: 1 Visit OT Evaluation $OT Eval Moderate Complexity: 1 Mod  Lauren Velazquez MSOT, OTR/L Acute Rehab Pager: [phone] Office:  [phone]  Lauren Velazquez Lauren Velazquez 07/02/2021, 5:20 PM

## 2021-07-02 NOTE — Progress Notes (Signed)
Inpatient Diabetes Program Recommendations  AACE/ADA: New Consensus Statement on Inpatient Glycemic Control (2015)  Target Ranges:  Prepandial:   less than 140 mg/dL      Peak postprandial:   less than 180 mg/dL (1-2 hours)      Critically ill patients:  140 - 180 mg/dL   Lab Results  Component Value Date   GLUCAP 254 (H) 07/02/2021   HGBA1C 8.2 (H) 06/20/2021    Review of Glycemic Control  Latest Reference Range & Units 07/01/21 23:50 07/02/21 03:43 07/02/21 07:30 07/02/21 11:54  Glucose-Capillary 70 - 99 mg/dL 939 (H) 030 (H) 092 (H) 254 (H)   Diabetes history: DM 2 Outpatient Diabetes medications:  Lantus 70 units bid, Novolog 20 units tid with meals Current orders for Inpatient glycemic control:  Novolog resistant q 4 hours  Inpatient Diabetes Program Recommendations:    Please consider adding Semglee 20 units daily.   Thanks,  Beryl Meager, RN, BC-ADM Inpatient Diabetes Coordinator Pager [phone]  (8a-5p)

## 2021-07-02 NOTE — Procedures (Signed)
Extubation Procedure Note  Patient Details:   Name: Nannette Zill DOB: [birth year] MRN: [id number]   Airway Documentation:    Vent end date: 07/02/21 Vent end time: 0853   Evaluation  O2 sats: stable throughout Complications: No apparent complications Patient did tolerate procedure well. Bilateral Breath Sounds: Diminished, Rhonchi   Yes  Patient tolerated wean. Positive for cuff leak. Patient extubated to a 4 lpm [place]. No signs of dyspnea or stridor noted. Patient resting comfortably. RN and NP at bedside. Will continue to monitor.   Ancil Boozer 07/02/2021, 9:19 AM

## 2021-07-02 NOTE — Progress Notes (Signed)
LTM maint complete - no skin breakdown under: Fp2 F4 A2 [facility] monitored, Event button test confirmed by [facility].   

## 2021-07-02 NOTE — Progress Notes (Addendum)
STROKE TEAM PROGRESS NOTE   SUBJECTIVE (INTERVAL HISTORY) No family is present at the bedside. Patient is is laying with feet over side rail. She is currently connected to a continuous EEG. She was extubated around 0853 on 07/02/2021, RN at bedside during exam.    OBJECTIVE Vitals:   07/02/21 1230 07/02/21 1300 07/02/21 1315 07/02/21 1340  BP: (!) 209/61 (!) 210/66 (!) 175/97 (!) 190/70  Pulse: 86 93 86 85  Resp: (!) 27 17 (!) 25 (!) 24  Temp:      TempSrc:      SpO2: 97% 96% 97% 94%  Weight:      Height:        CBC:  Recent Labs  Lab 07/01/21 0330 07/02/21 0355  WBC 13.6* 9.6  NEUTROABS  --  8.0*  HGB 10.3* 8.9*  HCT 36.0 31.0*  MCV 84.9 84.7  PLT 487* 407*    Basic Metabolic Panel:  Recent Labs  Lab 07/01/21 0330 07/02/21 0355  NA 141 140  K 4.0 4.4  CL 104 108  CO2 27 25  GLUCOSE 114* 260*  BUN 10 17  CREATININE 0.84 0.67  CALCIUM 8.8* 8.7*  MG 2.5* 2.5*  PHOS 3.5 3.0    Lipid Panel: No results for input(s): CHOL, TRIG, HDL, CHOLHDL, VLDL, LDLCALC in the last 168 hours. HgbA1c:  Lab Results  Component Value Date   HGBA1C 8.2 (H) 06/20/2021   Urine Drug Screen:     Component Value Date/Time   LABOPIA NONE DETECTED 06/19/2021 2257   COCAINSCRNUR NONE DETECTED 06/19/2021 2257   LABBENZ NONE DETECTED 06/19/2021 2257   AMPHETMU NONE DETECTED 06/19/2021 2257   THCU NONE DETECTED 06/19/2021 2257   LABBARB NONE DETECTED 06/19/2021 2257    Alcohol Level No results found for: [facility]  IMAGING  Results for orders placed or performed during the hospital encounter of 06/30/21  MR BRAIN WO CONTRAST   Narrative   CLINICAL DATA:  Mental status change, unknown cause  EXAM: MRI HEAD WITHOUT CONTRAST  TECHNIQUE: Multiplanar, multiecho pulse sequences of the brain and surrounding structures were obtained without intravenous contrast.  COMPARISON:  06/21/2021.  FINDINGS: Brain: Scattered foci of restricted diffusion with ADC correlate, largely cortical,  in both cerebral hemispheres, most of which are punctate, with larger areas of infarction right parietal lobe (series 5, image 85) and left occipital lobe (series 5, image 68). The largest areas are also associated with increased T2 signal and may be subacute. The vast majority of these are new compared to 06/21/2021, with only 1 seen on the prior exam (right inferior parietal lobe lobe, posterior to the right lateral ventricle). The bilateral frontal lobes appear least affected. No foci of restricted diffusion in the cerebellum.  No acute hemorrhage, mass, mass effect, or midline shift. No hydrocephalus or extra-axial collection.  Vascular: Normal flow voids.  Skull and upper cervical spine: Normal marrow signal.  Sinuses/Orbits: Mucosal thickening in the ethmoid air cells, maxillary sinuses, and sphenoid sinuses. The orbits are unremarkable.  Other: Fluid throughout the bilateral mastoid air cells.  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 The 2 largest areas of infarction, in the right parietal and left occipital lobes, are associated with increased T2 signal and are likely subacute. Given multiple vascular territories, embolic etiology is suspected, although a hypotensive episode could also produce this appearance.   Electronically Signed   By: Bryson Ha  Vasan M.D.   On: 07/01/2021 11:55   Results for orders placed or performed during the hospital encounter of 06/19/21  MR BRAIN W WO CONTRAST   Narrative   CLINICAL DATA:  60 year old female with unexplained altered mental status. Sepsis.  EXAM: MRI HEAD WITHOUT AND WITH CONTRAST  TECHNIQUE: Multiplanar, multiecho pulse sequences of the brain and surrounding structures were obtained without and with intravenous contrast.  CONTRAST:  34m GADAVIST GADOBUTROL 1 MMOL/ML IV  SOLN  COMPARISON:  Head CT 06/19/2021.  FINDINGS: Brain: Scattered small cortical and occasionally white matter areas of restricted diffusion in both cerebral hemispheres. Many are punctate. Bilateral PCA, left MCA territories are affected. Minimal associated T2 and FLAIR hyperintensity. But no deep gray matter nuclei, brainstem or cerebellar involvement.  Superimposed small chronic lacunar infarct of the left caudate nucleus. Mild patchy bilateral periatrial white matter T2 and FLAIR hyperintensity.  No acute or chronic intracranial hemorrhage. No midline shift, mass effect, evidence of mass lesion, ventriculomegaly, extra-axial collection. Cervicomedullary junction and pituitary are within normal limits.  No abnormal enhancement identified.  No dural thickening.  Vascular: Major intracranial vascular flow voids are preserved. The major dural venous sinuses are enhancing and appear to be patent.  Skull and upper cervical spine: Negative visible cervical spine. Visualized bone marrow signal is within normal limits.  Sinuses/Orbits: Negative orbits. Trace paranasal sinus mucosal thickening.  Other: Mastoids are clear. Visible internal auditory structures appear normal. Intubated. Small volume retained secretions in the nasopharynx.  IMPRESSION: 1. Scattered punctate acute infarcts in both cerebral hemispheres (bilateral PCA and left MCA territories). No associated hemorrhage or mass effect. Although the posterior fossa and anterior cerebral artery territories are spared, the pattern is suspicious for a recent Embolic event. Hypotensive episode might also produce this appearance.  2. 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   Electronically Signed   By: HGenevie AnnM.D.   On: 06/21/2021 05:31   MR ANGIO HEAD WO CONTRAST   Narrative   CLINICAL DATA:  Initial evaluation for carotid stenosis  screening.  EXAM: MRA NECK WITHOUT AND WITH CONTRAST  MRA HEAD WITHOUT CONTRAST  TECHNIQUE: Multiplanar and multiecho pulse sequences of the neck were obtained without and with intravenous contrast. Angiographic images of the neck were obtained using MRA technique without and with intravenous contrast; Angiographic images of the Circle of Willis were obtained using MRA technique without intravenous contrast.  CONTRAST:  128mGADAVIST GADOBUTROL 1 MMOL/ML IV SOLN  COMPARISON:  Comparison made with prior brain MRI from 06/21/2021  FINDINGS: MRA NECK FINDINGS  AORTIC ARCH: Visualized aortic arch normal caliber with normal branch pattern. No hemodynamically significant stenosis about the origin of the great vessels.  RIGHT CAROTID SYSTEM: Right CCA patent from its origin to the bifurcation without stenosis. Atheromatous narrowing at the right carotid bulb/proximal right ICA with associated stenosis of up to 40-50% by NASCET criteria (series 1126, image 1). Right ICA otherwise patent distally without stenosis, evidence for dissection or occlusion.  LEFT CAROTID SYSTEM: Left CCA patent from its origin to the bifurcation without stenosis. No significant atheromatous narrowing or irregularity about the left carotid bulb. Left ICA patent distally without stenosis, evidence for dissection or occlusion.  VERTEBRAL ARTERIES: Both vertebral arteries arise from the subclavian arteries. Vertebral arteries are largely codominant. Vertebral arteries mildly tortuous but are widely patent without stenosis evidence for dissection or occlusion.  MRA HEAD FINDINGS  ANTERIOR CIRCULATION:  Distal cervical  segments of the internal carotid arteries are widely patent with antegrade flow. Petrous, cavernous, and supraclinoid segments widely patent without stenosis or other abnormality. A1 segments patent bilaterally. Normal anterior communicating artery complex. Mild irregularity about the ACAs  favored to be related to motion artifact. ACAs patent to their distal aspects without significant stenosis. No M1 stenosis or occlusion. Normal MCA bifurcations. Distal MCA branches well perfused and symmetric.  POSTERIOR CIRCULATION:  Both vertebral arteries patent to the vertebrobasilar junction without stenosis. Both PICA origins patent and normal. Basilar patent to its distal aspect without stenosis. Superior cerebellar arteries patent bilaterally. Both PCAs supplied via hypoplastic P1 segments and robust bilateral posterior communicating arteries. PCAs well perfused to their distal aspects without stenosis.  No intracranial aneurysm.  IMPRESSION: MRA HEAD IMPRESSION:  Negative intracranial MRA. No large vessel occlusion or hemodynamically significant stenosis.  MRA NECK IMPRESSION:  1. 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 2. Wide patency of the left carotid artery system within the neck. 3. Widely patent vertebral arteries within the neck.   Electronically Signed   By: Jeannine Boga M.D.   On: 06/25/2021 06:12   CT Head Wo Contrast   Narrative   CLINICAL DATA:  Altered mental status.  EXAM: CT HEAD WITHOUT CONTRAST  TECHNIQUE: Contiguous axial images were obtained from the base of the skull through the vertex without intravenous contrast.  COMPARISON:  None.  FINDINGS: Brain: Patient was scanned in the decubitus position. No intracranial hemorrhage, mass effect, or midline shift. No hydrocephalus. The basilar cisterns are patent. No evidence of territorial infarct or acute ischemia. No extra-axial or intracranial fluid collection.  Vascular: No hyperdense vessel or unexpected calcification.  Skull: No fracture or focal lesion.  Sinuses/Orbits: No acute findings. Periapical lucencies are noted about multiple upper teeth.  Other: None.  IMPRESSION: No acute intracranial  abnormality.   Electronically Signed   By: Keith Rake M.D.   On: 06/19/2021 22:51      PHYSICAL EXAM  Physical Exam  Constitutional: Appears well-developed and well-nourished.  Psych: Agitation and impulsive Eyes: No scleral injection HENT: No OP obstrucion MSK: no joint deformities.  Cardiovascular: Normal rate and regular rhythm.  Respiratory: Effort normal, non-labored breathing GI: Soft.  No distension. There is no tenderness.  Skin: WDI  Neuro: Mental Status: Patient is awake, alert and oriented to person and place Patient is not able to give a clear and coherent history. Disoriented to time and situation.  No signs of aphasia or neglect, voice is quiet and hoarse, presumably from recent extubation.   Cranial Nerves: II: Visual Fields are full. Pupils are equal, round, and reactive to light.   III,IV, VI: EOMI without ptosis or diploplia.  V: Facial sensation is symmetric to temperature VII: Facial movement is symmetric resting and smiling VIII: Hearing is intact to voice X: Palate elevates symmetrically XI: Shoulder shrug is symmetric. XII: Tongue protrudes midline without atrophy or fasciculations.  Motor: Tone is normal. Bulk is normal. RUE 4/5   LUE 5/5 Bilateral LE 4/5  Sensory: Sensation is symmetric to light touch in the arms and legs. No extinction to DSS present. Plantars: Toes are downgoing bilaterally.  Cerebellar: FNF intact bilaterally   ASSESSMENT/PLAN Lauren Velazquez is a 60 y.o. female with history of DM2, HTN, Vertigo, HLD, Anxiety, back pain, and COVID-19 presenting with altered mental status. Patient was admitted on 06/20/2021 to [facility]- intubated and transferred to ICU,  extubated 11/12. On 11/16 patient had an episode of complete unresponsiveness with foaming at the mouth and posturing with acute hypotension.  She was intubated emergently and transferred to the ICU.   Patient was evaluated by neurology and  stat CT and CTA head and neck were negative for acute findings, she was loaded with 4g Keppra and transferred to [facility] hospital for continuous EEG. See timeline from [place] below.  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, DDx including  Hypercoagulability with metabolic syndrome and infection - morbid obesity, uncontrolled DM, HLD, fever  Endocarditis - fever and leukocytosis, but blood culture neg, TEE no endocarditis Paradoxical emboli - TEE no PFO and LE venous doppler no acute DVT Infectious or noninfectious vasculitis - MRI no T2 FLAIR change, LP no meningitis or encephalitis, no elevated protein, CTA  and MRA neg APL - ANA neg, hypercoag pending, no hx of miscarriage  MRI head 11/17 Scattered acute and subacute infarcts in the bilateral cerebral hemispheres, largely the MCA and PCA territories, the majority of which are new compared to 06/21/2021.  MRA head 11/11- negative MRA head. Atheromatous narrowing up to 40-50% involving the right carotid bulb/prox RICA. Patent L carotid arterial system, patient vertebral arteries  CTA head and neck 11/16 - negative 2D Echo  - EF 60-65% TEE - No evidence of intraarterial shunt, no atrial appendage thrombus detected Venous duplex- Left: findings consistent with age indeterminate deep vein thrombosis involving the left posterior tibial veins LDL 133 HgbA1c 8.2 UDS neg Hypercoagulable labs pending Heparin subq for VTE prophylaxis No antithrombotic prior to admission, now on aspirin 81 mg daily.  Ongoing aggressive stroke risk factor management Therapy recommendations:  Pending  Disposition:  Neuro ICU  Behavior disturbance  Reported bizarre behavior in ED Agitation in ICU but following commands and answer all questions appropriately ? 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   Other Active Problems Acute Hypoxemic Respiratory Failure and restrictive lung disease Extubated 11/18 at 0853 Initially seeing pulm outpt for post covid dsypnea Managed with CCM- appreciate recommendations  Pulmicort, anoro ellipta, albuterol Possible Aspiration Abx coverage- Unasyn Metabolic Encephalopathy Correct metabolic abnormalities Continuous Reorientation  Decreased UOP Lasix 56m Monitor renal function   Hospital day # 2  TIMELINE FROM [place]: 06/20/21- Admit to ICU  with suspected meningitis s/p mechanical intubation for airway protection in the setting of increased sedation needs and agitation. S/p LP _0 :37 AM. ID consult. Patient started empirically on vancomycin, ceftriaxone, ampicillin and acyclovir.  ID consult. 06/21/21- MRI w/out contrast shows acute infarcts (embolic vs. Hypotensive). Neuro, and psych consulted.  TTE showed normal LVEF, grade I diastolic dysfunction, no sig valvular abnormalities and no intracardiac clot.  rEEG showed moderate diffuse slowing with subtle superimposed right focal slowing without epileptiform abnormalities 06/22/21-persistent agitation off of sedation.  Started Depakene for mood stabilization and cyproheptadine for potential serotonergic symptoms. 06/23/21: Remains on the vent and sedated. 06/24/21:  Remains on the vent. Neuro eval recs central embolic suspected.  Start ASA 42m daily + plavix 768mdaily x21 days f/b ASA 8169maily after that.  Consider TEE 06/25/21: Remains on the vent and  sedation.  Status post TEE negative for thrombus or vegetation. 06/26/21: S/p extubation now IVC with sitter at the bedside 06/27/21: Stable on [place] _0  with fluctuating mental status. Sitter at bedside 06/28/21: Restart home dose of oxycodone for concern of possible withdrawal. EEG per neuro r/o seizure activity. Patient stable, able to be transferred to step down care. PCCM to sign sign 06/29/21: Transferred to Med/surg 06/30/21: Rapid response called for unresponsiveness and hypotension.  Upon arrival to ICU concern for seizure activity.  Intubated for airway protection.  [facility], P[facility]M admit   Pt seen by NP/Neuro and later by MD. Note/plan to be edited by MD as needed.  DevJanine OresNP-BC [facility]  ATTENDING NOTE: I reviewed above note and agree with the assessment and plan. Pt was seen and examined.   [age]ar old female with history of diabetes, hypertension, morbid [facility], encephalopathy and bizarre behavior.  Found to have respiratory failure and was intubated.  MRI showed bilateral hemisphere punctate infarcts.  Patient had T-max of 104, WBC 15.4, had LP showed WBC 22, protein 45, was put on Vanco/Rocephin/ampicillin/acyclovir but then discontinued due to negative culture and PCR.  EF 60 to 61%, TEE no endocarditis no PFO.  MRI head and neck unremarkable.  EEG showed encephalopathy.  Patient later extubated but developed episode of unresponsiveness, hypotension, body posturing and most forming and was reintubated.  Concerning for seizure activity, put on Keppra and Vimpat.  CT no acute abnormality, CT head and neck unremarkable.  LDL 133, A1c 8.2.  ANA negative.  ESR 75, CRP 4.1.  UDS negative.  Long-term EEG no seizure.  However, repeat MRI showed progressive embolic shower bilaterally.  CT abdomen pelvis negative.  DVT age indeterminant left posterior tibial DVT.  Hypercoagulable work-up pending.  Patient  was extubated this morning, however again agitation, restless in bed even with restrictions.  On exam, patient lying in bed, no family at bedside, awake, alert, just extubated, eyes open, orientated to age, place, time. No aphasia, but hypophonia, whispers for speaking, following all simple commands. Able to name and repeat. No gaze palsy, tracking bilaterally, blinking to visual threat bilaterally, PERRL. No facial droop. Tongue midline. LUE no drift, RUE drift to bed within 5 sec. However, finger grip b/l 4/5. BLE proximal 3/5 and 4/5. Sensation symmetrical bilaterally subjectively, b/l FTN not cooperative, gait not tested.   Etiology for patient embolic strokes not quite clear, concerning for hypercoagulable state in the setting of metabolic syndrome, uncontrolled diabetes as well as infection.  DDx including endocarditis, paradoxical emboli, vasculitis, antiphospholipid syndrome, however evidence so far not supporting.  Follow-up with hypercoagulable work-up.  Continue aspirin and Zetia.  Patient has statin intolerance in the past.  Etiology for patient agitation/bizarre behavior not quite clear, MRI not suggesting autoimmune encephalitis, CT abdomen pelvis no teratoma to suggest NMDA receptor encephalitis.  MRI T2 flair, MRA and CTA and LP not suggesting vasculitis.  LP also rule out infectious meningitis or encephalitis at this time.  EEG no seizure, currently long-term EEG ongoing.  CCM on board.  Patient dysphagia, did not pass swallow, had core track placed, currently on tube feeding and BP meds for hypertension treatment.  For detailed assessment and plan, please refer to above  as I have made changes wherever appropriate.   Lauren Hawking, MD PhD Stroke Neurology 07/02/2021 7:09 PM  This patient is critically ill due to embolic shower, bizarre behavior, dysphagia, agitation, respiratory failure, fever and at significant risk of neurological worsening, death form sepsis, recurrent stroke,  hemorrhagic conversion, seizure. This patient's care requires constant monitoring of vital signs, hemodynamics, respiratory and cardiac monitoring, review of multiple databases, neurological assessment, discussion with family, other specialists and medical decision making of high complexity. I spent 50 minutes of neurocritical care time in the care of this patient.    To contact Stroke Continuity provider, please refer to [email or web address] After hours, contact General Neurology

## 2021-07-02 NOTE — Procedures (Addendum)
Patient Name: Kaiyla Stahly  MRN: [id number]  Epilepsy Attending: Charlsie Quest  Referring Physician/Provider: Dr. Caryl Pina Duration: 07/02/2021 0042 to 07/03/2021 0042   Patient history: 60 year old female with altered mental status and seizure.  EEG to evaluate for seizure.   Level of alertness:  lethargic   AEDs during EEG study: Keppra, Vimpat, versed   Technical aspects: This EEG study was done with scalp electrodes positioned according to the 10-20 International system of electrode placement. Electrical activity was acquired at a sampling rate of 500Hz  and reviewed with a high frequency filter of 70Hz  and a low frequency filter of 1Hz . EEG data were recorded continuously and digitally stored.    Description: EEG showed continuous generalized predominantly 5-9Hz  theta-alpha activity admixed with 2-3hz  generalized delta slowing.  Hyperventilation and photic stimulation were not performed.      ABNORMALITY - Continuous slow, generalized   IMPRESSION: This study is suggestive of moderate to severe diffuse encephalopathy, nonspecific etiology. No seizures or epileptiform discharges were seen throughout the recording.   Ziv Welchel 

## 2021-07-02 NOTE — Progress Notes (Signed)
Contacted MD due to pt's high BP without PRN medication.  Waiting for orders.

## 2021-07-02 NOTE — Progress Notes (Signed)
Bilateral lower extremity venous duplex completed. Refer to "CV Proc" under chart review to view preliminary results.  07/02/2021 10:08 AM Eula Fried., MHA, RVT, RDCS, RDMS

## 2021-07-02 NOTE — Evaluation (Addendum)
Physical Therapy Evaluation Patient Details Name: Lauren Velazquez MRN: [id number] DOB: 08-01-1961 Today's Date: 07/02/2021  History of Present Illness  60 yo female presenting to [facility] ED on 11/6 with AMS with agitation; suspect viral meningitis. Intubated 11/6-11/12 for airway protection after sedation. Transfered to [facility] on 11/15. EEG negative for ongoing seizures. Rapid response called for unresponsiveness and hypotension on 11/16. 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 Intubated 11/16-11/18. PMH including HTN, T2DM, vertigo, anxiety, chronic back pain, and HLD.  Clinical Impression  Patient admitted with above diagnosis. Patient currently presents with impaired cognition, weakness, impaired balance, decreased activity tolerance, impaired functional mobility, and impaired coordination. Patient following ~25% of commands during session and varying from cooperative to paranoid throughout. Requires frequent cues for redirection and patient hallucinating during session (see cognition section). Patient requires maxA+2 for bed mobility. Patient will benefit from skilled PT services during acute stay to address listed deficits. Recommend CIR at discharge to maximize functional independence and safety as well as return to PLOF.        Recommendations for follow up therapy are one component of a multi-disciplinary discharge planning process, led by the attending physician.  Recommendations may be updated based on patient status, additional functional criteria and insurance authorization.  Follow Up Recommendations Acute inpatient rehab (3hours/day)    Assistance Recommended at Discharge Frequent or constant Supervision/Assistance  Functional Status Assessment Patient has had a recent decline in their functional status and demonstrates the ability to make significant improvements in function in a reasonable and predictable  amount of time.  Equipment Recommendations  Other (comment) (TBD)    Recommendations for Other Services Rehab consult     Precautions / Restrictions Precautions Precautions: Fall Restrictions Weight Bearing Restrictions: No      Mobility  Bed Mobility Overal bed mobility: Needs Assistance Bed Mobility: Supine to Sit;Sit to Supine     Supine to sit: Max assist;+2 for physical assistance;+2 for safety/equipment;HOB elevated Sit to supine: +2 for physical assistance;+2 for safety/equipment;Total assist   General bed mobility comments: Max A +2 to bringing Bles towards EOB and then elevating trunk. Total A for reutrn ot supine    Transfers                   General transfer comment: Defer due to safety    Ambulation/Gait                  Stairs            Wheelchair Mobility    Modified Rankin (Stroke Patients Only) Modified Rankin (Stroke Patients Only) Pre-Morbid Rankin Score: No symptoms Modified Rankin: Severe disability     Balance Overall balance assessment: Needs assistance Sitting-balance support: No upper extremity supported;Feet supported Sitting balance-Leahy Scale: Fair Sitting balance - Comments: Able to maintain static sitting. Very close [employer] A for safety and cognition                                     Pertinent Vitals/Pain Pain Assessment: Faces Faces Pain Scale: Hurts little more Pain Location: Generalized Pain Descriptors / Indicators: Aching;Discomfort;Grimacing Pain Intervention(s): Limited activity within patient's tolerance;Monitored during session;Repositioned    Home Living Family/patient expects to be discharged to:: Private residence Living Arrangements: Spouse/significant other Available Help at Discharge: Family;Available 24 hours/day Type of Home: House Home Access: Stairs to  enter Entrance Stairs-Rails: Right Entrance Stairs-Number of Steps: 3   Home Layout: One level Home  Equipment: Standard Lanell Dubie Additional Comments: Above information from last admission. Pt unable to provide information due to cognition and orietation status.    Prior Function Prior Level of Function : Independent/Modified Independent             Mobility Comments: Per chart review, Pt notes indep without AD for functional mobility. ADLs Comments: Per cahrt review, pt drives, use to work and recently lost her job, indep w/ ADLs. on 3L home O2     Hand Dominance        Extremity/Trunk Assessment   Upper Extremity Assessment Upper Extremity Assessment: Defer to OT evaluation RUE Deficits / Details: Moving arms against gravity but becoming fatigued quickly RUE Coordination: decreased gross motor LUE Deficits / Details: Moving arms against gravity but becoming fatigued quickly LUE Coordination: decreased gross motor    Lower Extremity Assessment Lower Extremity Assessment: Difficult to assess due to impaired cognition RLE Deficits / Details: able to perform LAQ - grossly 3+/5 RLE Coordination: decreased gross motor LLE Deficits / Details: able to perform LAQ -grossly 3+/5 LLE Coordination: decreased gross motor    Cervical / Trunk Assessment Cervical / Trunk Assessment: Kyphotic  Communication   Communication: No difficulties  Cognition Arousal/Alertness: Lethargic Behavior During Therapy: Restless;Flat affect;Impulsive Overall Cognitive Status: Difficult to assess                                 General Comments: Pt hallucinating "bedbugs" and stating she can see them all over her bed. Pt also stating she can see urine pour down the walls. Pt following 30% of simple cues. Flucuating between cooroporative and paranoid. Poor ST memory. Perseverating on seeing her husband. Only oriented to self        General Comments General comments (skin integrity, edema, etc.): SpO2 and HR stable on 3L. BP elevated; taking BP on RLE. Notified RN.    Exercises      Assessment/Plan    PT Assessment Patient needs continued PT services  PT Problem List Decreased strength;Decreased balance;Decreased activity tolerance;Decreased mobility;Decreased coordination;Decreased cognition;Decreased knowledge of use of DME;Decreased safety awareness;Decreased knowledge of precautions;Cardiopulmonary status limiting activity       PT Treatment Interventions DME instruction;Gait training;Functional mobility training;Therapeutic activities;Therapeutic exercise;Neuromuscular re-education;Balance training;Patient/family education    PT Goals (Current goals can be found in the Care Plan section)  Acute Rehab PT Goals Patient Stated Goal: did not state PT Goal Formulation: Patient unable to participate in goal setting Time For Goal Achievement: 07/16/21 Potential to Achieve Goals: Fair    Frequency Min 3X/week   Barriers to discharge        Co-evaluation PT/OT/SLP Co-Evaluation/Treatment: Yes Reason for Co-Treatment: For patient/therapist safety;To address functional/ADL transfers;Necessary to address cognition/behavior during functional activity PT goals addressed during session: Mobility/safety with mobility OT goals addressed during session: ADL's and self-care       AM-PAC PT "6 Clicks" Mobility  Outcome Measure Help needed turning from your back to your side while in a flat bed without using bedrails?: Total Help needed moving from lying on your back to sitting on the side of a flat bed without using bedrails?: Total Help needed moving to and from a bed to a chair (including a wheelchair)?: Total Help needed standing up from a chair using your arms (e.g., wheelchair or bedside chair)?: Total Help needed to walk  in hospital room?: Total Help needed climbing 3-5 steps with a railing? : Total 6 Click Score: 6    End of Session Equipment Utilized During Treatment: Oxygen Activity Tolerance: Patient tolerated treatment well Patient left: in bed;with  call bell/phone within reach;with restraints reapplied;with nursing/sitter in room Nurse Communication: Mobility status PT Visit Diagnosis: Unsteadiness on feet (R26.81);Muscle weakness (generalized) (M62.81);Other abnormalities of gait and mobility (R26.89);Other symptoms and signs involving the nervous system (X52.841)    Time: 3244-0102 PT Time Calculation (min) (ACUTE ONLY): 15 min   Charges:   PT Evaluation $PT Eval Moderate Complexity: 1 Mod          Latavious Bitter A. Dan Humphreys PT, DPT Acute Rehabilitation Services Pager [phone] Office [phone]   Viviann Spare 07/02/2021, 5:41 PM

## 2021-07-02 NOTE — Procedures (Signed)
Cortrak ° °Person Inserting Tube:  Patrich Heinze E, RD °Tube Type:  Cortrak - 43 inches °Tube Size:  10 °Tube Location:  Left nare °Initial Placement:  Stomach °Secured by: Bridle °Technique Used to Measure Tube Placement:  Marking at nare/corner of mouth °Cortrak Secured At:  65 cm ° °Cortrak Tube Team Note: ° °Consult received to place a Cortrak feeding tube.  ° °X-ray is required, abdominal x-ray has been ordered by the Cortrak team. Please confirm tube placement before using the Cortrak tube.  ° °If the tube becomes dislodged please keep the tube and contact the Cortrak team at [email or web address] (password [email or web address]) for replacement.  °If after hours and replacement cannot be delayed, place a NG tube and confirm placement with an abdominal x-ray.  ° ° ° °Thurley Francesconi A., MS, RD, LDN (she/her/hers) °RD pager number and weekend/on-call pager number located in Amion. ° ° °

## 2021-07-02 NOTE — Progress Notes (Signed)
LTM maint complete - no skin breakdown under:  CZ C4 P7 A1

## 2021-07-02 NOTE — Progress Notes (Signed)
eLink Physician-Brief Progress Note Patient Name: Lauren Velazquez DOB: Nov 15, 1960 MRN: [id number]   Date of Service  07/02/2021  HPI/Events of Note  Patient with elevated blood pressure.  eICU Interventions  PRN iv Hydralazine ordered.        Thomasene Lot Dortha Neighbors 07/02/2021, 4:19 AM

## 2021-07-03 ENCOUNTER — Inpatient Hospital Stay (HOSPITAL_COMMUNITY): Payer: BLUE CROSS/BLUE SHIELD

## 2021-07-03 ENCOUNTER — Other Ambulatory Visit (HOSPITAL_COMMUNITY): Payer: BLUE CROSS/BLUE SHIELD

## 2021-07-03 DIAGNOSIS — G9340 Encephalopathy, unspecified: Secondary | ICD-10-CM | POA: Diagnosis not present

## 2021-07-03 DIAGNOSIS — I6300 Cerebral infarction due to thrombosis of unspecified precerebral artery: Secondary | ICD-10-CM

## 2021-07-03 DIAGNOSIS — R0689 Other abnormalities of breathing: Secondary | ICD-10-CM | POA: Diagnosis not present

## 2021-07-03 DIAGNOSIS — R569 Unspecified convulsions: Secondary | ICD-10-CM | POA: Diagnosis not present

## 2021-07-03 DIAGNOSIS — I6310 Cerebral infarction due to embolism of unspecified precerebral artery: Secondary | ICD-10-CM | POA: Diagnosis not present

## 2021-07-03 LAB — CBC WITH DIFFERENTIAL/PLATELET
Abs Immature Granulocytes: 0.19 10*3/uL — ABNORMAL HIGH (ref 0.00–0.07)
Basophils Absolute: 0.1 10*3/uL (ref 0.0–0.1)
Basophils Relative: 0 %
Eosinophils Absolute: 0 10*3/uL (ref 0.0–0.5)
Eosinophils Relative: 0 %
HCT: 32.4 % — ABNORMAL LOW (ref 36.0–46.0)
Hemoglobin: 9.4 g/dL — ABNORMAL LOW (ref 12.0–15.0)
Immature Granulocytes: 2 %
Lymphocytes Relative: 12 %
Lymphs Abs: 1.4 10*3/uL (ref 0.7–4.0)
MCH: 23.9 pg — ABNORMAL LOW (ref 26.0–34.0)
MCHC: 29 g/dL — ABNORMAL LOW (ref 30.0–36.0)
MCV: 82.4 fL (ref 80.0–100.0)
Monocytes Absolute: 0.9 10*3/uL (ref 0.1–1.0)
Monocytes Relative: 7 %
Neutro Abs: 9.1 10*3/uL — ABNORMAL HIGH (ref 1.7–7.7)
Neutrophils Relative %: 79 %
Platelets: 483 10*3/uL — ABNORMAL HIGH (ref 150–400)
RBC: 3.93 MIL/uL (ref 3.87–5.11)
RDW: 16.8 % — ABNORMAL HIGH (ref 11.5–15.5)
WBC: 11.6 10*3/uL — ABNORMAL HIGH (ref 4.0–10.5)
nRBC: 0.3 % — ABNORMAL HIGH (ref 0.0–0.2)

## 2021-07-03 LAB — BASIC METABOLIC PANEL
Anion gap: 10 (ref 5–15)
BUN: 9 mg/dL (ref 6–20)
CO2: 28 mmol/L (ref 22–32)
Calcium: 8.8 mg/dL — ABNORMAL LOW (ref 8.9–10.3)
Chloride: 103 mmol/L (ref 98–111)
Creatinine, Ser: 0.53 mg/dL (ref 0.44–1.00)
GFR, Estimated: >60 mL/min (ref >60)
Glucose, Bld: 223 mg/dL — ABNORMAL HIGH (ref 70–99)
Potassium: 3.6 mmol/L (ref 3.5–5.1)
Sodium: 141 mmol/L (ref 135–145)

## 2021-07-03 LAB — GLUCOSE, CAPILLARY
Glucose-Capillary: 224 mg/dL — ABNORMAL HIGH (ref 70–99)
Glucose-Capillary: 229 mg/dL — ABNORMAL HIGH (ref 70–99)
Glucose-Capillary: 232 mg/dL — ABNORMAL HIGH (ref 70–99)
Glucose-Capillary: 240 mg/dL — ABNORMAL HIGH (ref 70–99)
Glucose-Capillary: 252 mg/dL — ABNORMAL HIGH (ref 70–99)
Glucose-Capillary: 252 mg/dL — ABNORMAL HIGH (ref 70–99)

## 2021-07-03 LAB — LUPUS ANTICOAGULANT PANEL
DRVVT: 38.7 s (ref 0.0–47.0)
PTT Lupus Anticoagulant: 32.8 s (ref 0.0–51.9)

## 2021-07-03 LAB — CULTURE, RESPIRATORY W GRAM STAIN: Culture: NORMAL

## 2021-07-03 LAB — PROTEIN S, TOTAL: Protein S Ag, Total: 119 % (ref 60–150)

## 2021-07-03 LAB — PROTEIN S ACTIVITY: Protein S Activity: 84 % (ref 63–140)

## 2021-07-03 LAB — PROTEIN C ACTIVITY: Protein C Activity: 139 % (ref 73–180)

## 2021-07-03 MED ORDER — HALOPERIDOL LACTATE 5 MG/ML IJ SOLN
5.0000 mg | Freq: Once | INTRAMUSCULAR | Status: AC
  Administered 2021-07-03: 5 mg via INTRAVENOUS
  Filled 2021-07-03: qty 1

## 2021-07-03 MED ORDER — LISINOPRIL 20 MG PO TABS
20.0000 mg | ORAL_TABLET | Freq: Once | ORAL | Status: AC
  Administered 2021-07-03: 20 mg
  Filled 2021-07-03: qty 1

## 2021-07-03 MED ORDER — LISINOPRIL 20 MG PO TABS
40.0000 mg | ORAL_TABLET | Freq: Every day | ORAL | Status: DC
  Administered 2021-07-04 – 2021-07-05 (×2): 40 mg
  Filled 2021-07-03 (×2): qty 2

## 2021-07-03 MED ORDER — LEVETIRACETAM IN NACL 1000 MG/100ML IV SOLN
1000.0000 mg | INTRAVENOUS | Status: AC
  Administered 2021-07-03 (×2): 1000 mg via INTRAVENOUS
  Filled 2021-07-03 (×2): qty 100

## 2021-07-03 MED ORDER — DEXMEDETOMIDINE HCL IN NACL 400 MCG/100ML IV SOLN
0.4000 ug/kg/h | INTRAVENOUS | Status: DC
Start: 2021-07-03 — End: 2021-07-06
  Administered 2021-07-03: 0.4 ug/kg/h via INTRAVENOUS
  Administered 2021-07-03: 1.4 ug/kg/h via INTRAVENOUS
  Administered 2021-07-03 (×2): 1.2 ug/kg/h via INTRAVENOUS
  Administered 2021-07-04: 1 ug/kg/h via INTRAVENOUS
  Administered 2021-07-04: 0.6 ug/kg/h via INTRAVENOUS
  Administered 2021-07-04: 0.7 ug/kg/h via INTRAVENOUS
  Administered 2021-07-04: 1.2 ug/kg/h via INTRAVENOUS
  Administered 2021-07-05: 0.8 ug/kg/h via INTRAVENOUS
  Filled 2021-07-03 (×9): qty 100

## 2021-07-03 MED ORDER — GABAPENTIN 250 MG/5ML PO SOLN
600.0000 mg | Freq: Three times a day (TID) | ORAL | Status: DC
  Administered 2021-07-03 – 2021-07-05 (×7): 600 mg
  Filled 2021-07-03 (×10): qty 12

## 2021-07-03 MED ORDER — POTASSIUM CHLORIDE 20 MEQ PO PACK
40.0000 meq | PACK | Freq: Once | ORAL | Status: AC
  Administered 2021-07-03: 40 meq
  Filled 2021-07-03: qty 2

## 2021-07-03 MED ORDER — LORAZEPAM 2 MG/ML IJ SOLN
2.0000 mg | Freq: Once | INTRAMUSCULAR | Status: DC
Start: 2021-07-03 — End: 2021-07-03

## 2021-07-03 MED ORDER — TRAMADOL HCL 50 MG PO TABS: 50.0000 mg | ORAL_TABLET | Freq: Two times a day (BID) | ORAL | Status: DC | PRN

## 2021-07-03 MED ORDER — LEVETIRACETAM IN NACL 1000 MG/100ML IV SOLN
1000.0000 mg | Freq: Two times a day (BID) | INTRAVENOUS | Status: DC
  Administered 2021-07-04 – 2021-07-07 (×7): 1000 mg via INTRAVENOUS
  Filled 2021-07-03 (×7): qty 100

## 2021-07-03 MED ORDER — SODIUM CHLORIDE 0.9 % IV SOLN: 2000.0000 mg | Freq: Once | INTRAVENOUS | Status: DC

## 2021-07-03 MED ORDER — LORAZEPAM 2 MG/ML IJ SOLN: 2.0000 mg | Freq: Once | INTRAMUSCULAR | Status: DC

## 2021-07-03 MED ORDER — INSULIN GLARGINE-YFGN 100 UNIT/ML ~~LOC~~ SOLN
15.0000 [IU] | Freq: Two times a day (BID) | SUBCUTANEOUS | Status: DC
Start: 2021-07-03 — End: 2021-07-04
  Administered 2021-07-03 (×2): 15 [IU] via SUBCUTANEOUS
  Filled 2021-07-03 (×5): qty 0.15

## 2021-07-03 MED ORDER — PANTOPRAZOLE 2 MG/ML SUSPENSION
40.0000 mg | Freq: Every day | ORAL | Status: DC
Start: 2021-07-03 — End: 2021-07-03

## 2021-07-03 NOTE — Progress Notes (Signed)
S: Has had 2 short-duration seizures per RN.   LTM was discontinued earlier today  O: BP 123/62   Pulse (!) 52   Temp 98.6 F (37 C) (Axillary)   Resp (!) 23   Ht 5\' 1"  (1.549 m)   Wt 105.1 kg   LMP 08/15/2008   SpO2 100%   BMI 43.78 kg/m   Neurological exam: (2:30 AM) Ment: Able to follow simple commands. Asks if she can have something to drink such as Gatorade or soda, with fluent speech. Drowsy/sedated.  CN: Will briefly track visually. Eyes conjugate. Face symmetric. Phonation intact.  Motor: Wiggles toes and digits of hands bilaterally without asymmetry when asked. In restraints.    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  - IV Ativan 2 mg x 1 now.  - Keppra scheduled dose increased to 1000 mg IV BID - Continue Vimpat at current dose - LTM EEG ordered - Seizure precautions.   Electronically signed: Dr. 10/13/2008

## 2021-07-03 NOTE — Progress Notes (Addendum)
STROKE TEAM PROGRESS NOTE   SUBJECTIVE (INTERVAL HISTORY) No family is present at the bedside. EEG discontinued 07/03/2021.  Patient was quite agitated yesterday requiring restraints and sedation.  This morning she is drowsy but arousable and follows commands appropriately and moves all 4 extremities well.  Vital signs stable.  OBJECTIVE Vitals:   07/03/21 1000 07/03/21 1100 07/03/21 1200 07/03/21 1300  BP: (!) 164/78 (!) 160/84 (!) 188/62 (!) 165/71  Pulse: 86 78 82 78  Resp: (!) 25 (!) 23 (!) 22 (!) 30  Temp:      TempSrc:      SpO2: 96% 95% 99% 97%  Weight:      Height:        CBC:  Recent Labs  Lab 07/02/21 0355 07/03/21 0342  WBC 9.6 11.6*  NEUTROABS 8.0* 9.1*  HGB 8.9* 9.4*  HCT 31.0* 32.4*  MCV 84.7 82.4  PLT 407* 483*     Basic Metabolic Panel:  Recent Labs  Lab 07/01/21 0330 07/02/21 0355 07/03/21 0342  NA 141 140 141  K 4.0 4.4 3.6  CL 104 108 103  CO2 27 25 28   GLUCOSE 114* 260* 223*  BUN 10 17 9   CREATININE 0.84 0.67 0.53  CALCIUM 8.8* 8.7* 8.8*  MG 2.5* 2.5*  --   PHOS 3.5 3.0  --      Lipid Panel: No results for input(s): CHOL, TRIG, HDL, CHOLHDL, VLDL, LDLCALC in the last 168 hours. HgbA1c:  Lab Results  Component Value Date   HGBA1C 8.2 (H) 06/20/2021   Urine Drug Screen:     Component Value Date/Time   LABOPIA NONE DETECTED 06/19/2021 2257   COCAINSCRNUR NONE DETECTED 06/19/2021 2257   LABBENZ NONE DETECTED 06/19/2021 2257   AMPHETMU NONE DETECTED 06/19/2021 2257   THCU NONE DETECTED 06/19/2021 2257   LABBARB NONE DETECTED 06/19/2021 2257    Alcohol Level No results found for: [facility]  IMAGING  Results for orders placed or performed during the hospital encounter of 06/30/21  MR BRAIN WO CONTRAST   Narrative   CLINICAL DATA:  Mental status change, unknown cause  EXAM: MRI HEAD WITHOUT CONTRAST  TECHNIQUE: Multiplanar, multiecho pulse sequences of the brain and surrounding structures were obtained without intravenous  contrast.  COMPARISON:  06/21/2021.  FINDINGS: Brain: Scattered foci of restricted diffusion with ADC correlate, largely cortical, in both cerebral hemispheres, most of which are punctate, with larger areas of infarction right parietal lobe (series 5, image 85) and left occipital lobe (series 5, image 68). The largest areas are also associated with increased T2 signal and may be subacute. The vast majority of these are new compared to 06/21/2021, with only 1 seen on the prior exam (right inferior parietal lobe lobe, posterior to the right lateral ventricle). The bilateral frontal lobes appear least affected. No foci of restricted diffusion in the cerebellum.  No acute hemorrhage, mass, mass effect, or midline shift. No hydrocephalus or extra-axial collection.  Vascular: Normal flow voids.  Skull and upper cervical spine: Normal marrow signal.  Sinuses/Orbits: Mucosal thickening in the ethmoid air cells, maxillary sinuses, and sphenoid sinuses. The orbits are unremarkable.  Other: Fluid throughout the bilateral mastoid air cells.  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 The 2 largest areas of infarction, in the right parietal and left occipital lobes, are associated with increased T2 signal and are likely subacute. Given multiple vascular territories,  embolic etiology is suspected, although a hypotensive episode could also produce this appearance.   Electronically Signed   By: Merilyn Baba M.D.   On: 07/01/2021 11:55   Results for orders placed or performed during the hospital encounter of 06/19/21  MR BRAIN W WO CONTRAST   Narrative   CLINICAL DATA:  60 year old female with unexplained altered mental status. Sepsis.  EXAM: MRI HEAD WITHOUT AND WITH CONTRAST  TECHNIQUE: Multiplanar, multiecho pulse sequences of the brain and  surrounding structures were obtained without and with intravenous contrast.  CONTRAST:  4mL GADAVIST GADOBUTROL 1 MMOL/ML IV SOLN  COMPARISON:  Head CT 06/19/2021.  FINDINGS: Brain: Scattered small cortical and occasionally white matter areas of restricted diffusion in both cerebral hemispheres. Many are punctate. Bilateral PCA, left MCA territories are affected. Minimal associated T2 and FLAIR hyperintensity. But no deep gray matter nuclei, brainstem or cerebellar involvement.  Superimposed small chronic lacunar infarct of the left caudate nucleus. Mild patchy bilateral periatrial white matter T2 and FLAIR hyperintensity.  No acute or chronic intracranial hemorrhage. No midline shift, mass effect, evidence of mass lesion, ventriculomegaly, extra-axial collection. Cervicomedullary junction and pituitary are within normal limits.  No abnormal enhancement identified.  No dural thickening.  Vascular: Major intracranial vascular flow voids are preserved. The major dural venous sinuses are enhancing and appear to be patent.  Skull and upper cervical spine: Negative visible cervical spine. Visualized bone marrow signal is within normal limits.  Sinuses/Orbits: Negative orbits. Trace paranasal sinus mucosal thickening.  Other: Mastoids are clear. Visible internal auditory structures appear normal. Intubated. Small volume retained secretions in the nasopharynx.  IMPRESSION: 1. Scattered punctate acute infarcts in both cerebral hemispheres (bilateral PCA and left MCA territories). No associated hemorrhage or mass effect. Although the posterior fossa and anterior cerebral artery territories are spared, the pattern is suspicious for a recent Embolic event. Hypotensive episode might also produce this appearance.  2. 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   Electronically Signed   By: Genevie Ann M.D.    On: 06/21/2021 05:31   MR ANGIO HEAD WO CONTRAST   Narrative   CLINICAL DATA:  Initial evaluation for carotid stenosis screening.  EXAM: MRA NECK WITHOUT AND WITH CONTRAST  MRA HEAD WITHOUT CONTRAST  TECHNIQUE: Multiplanar and multiecho pulse sequences of the neck were obtained without and with intravenous contrast. Angiographic images of the neck were obtained using MRA technique without and with intravenous contrast; Angiographic images of the Circle of Willis were obtained using MRA technique without intravenous contrast.  CONTRAST:  63mL GADAVIST GADOBUTROL 1 MMOL/ML IV SOLN  COMPARISON:  Comparison made with prior brain MRI from 06/21/2021  FINDINGS: MRA NECK FINDINGS  AORTIC ARCH: Visualized aortic arch normal caliber with normal branch pattern. No hemodynamically significant stenosis about the origin of the great vessels.  RIGHT CAROTID SYSTEM: Right CCA patent from its origin to the bifurcation without stenosis. Atheromatous narrowing at the right carotid bulb/proximal right ICA with associated stenosis of up to 40-50% by NASCET criteria (series 1126, image 1). Right ICA otherwise patent distally without stenosis, evidence for dissection or occlusion.  LEFT CAROTID SYSTEM: Left CCA patent from its origin to the bifurcation without stenosis. No significant atheromatous narrowing or irregularity about the left carotid bulb. Left ICA patent distally without stenosis, evidence for dissection or occlusion.  VERTEBRAL ARTERIES: Both vertebral arteries arise from the subclavian arteries. Vertebral arteries are largely codominant. Vertebral arteries mildly  tortuous but are widely patent without stenosis evidence for dissection or occlusion.  MRA HEAD FINDINGS  ANTERIOR CIRCULATION:  Distal cervical segments of the internal carotid arteries are widely patent with antegrade flow. Petrous, cavernous, and supraclinoid segments widely patent without stenosis or other  abnormality. A1 segments patent bilaterally. Normal anterior communicating artery complex. Mild irregularity about the ACAs favored to be related to motion artifact. ACAs patent to their distal aspects without significant stenosis. No M1 stenosis or occlusion. Normal MCA bifurcations. Distal MCA branches well perfused and symmetric.  POSTERIOR CIRCULATION:  Both vertebral arteries patent to the vertebrobasilar junction without stenosis. Both PICA origins patent and normal. Basilar patent to its distal aspect without stenosis. Superior cerebellar arteries patent bilaterally. Both PCAs supplied via hypoplastic P1 segments and robust bilateral posterior communicating arteries. PCAs well perfused to their distal aspects without stenosis.  No intracranial aneurysm.  IMPRESSION: MRA HEAD IMPRESSION:  Negative intracranial MRA. No large vessel occlusion or hemodynamically significant stenosis.  MRA NECK IMPRESSION:  1. 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 2. Wide patency of the left carotid artery system within the neck. 3. Widely patent vertebral arteries within the neck.   Electronically Signed   By: Jeannine Boga M.D.   On: 06/25/2021 06:12   CT Head Wo Contrast   Narrative   CLINICAL DATA:  Altered mental status.  EXAM: CT HEAD WITHOUT CONTRAST  TECHNIQUE: Contiguous axial images were obtained from the base of the skull through the vertex without intravenous contrast.  COMPARISON:  None.  FINDINGS: Brain: Patient was scanned in the decubitus position. No intracranial hemorrhage, mass effect, or midline shift. No hydrocephalus. The basilar cisterns are patent. No evidence of territorial infarct or acute ischemia. No extra-axial or intracranial fluid collection.  Vascular: No hyperdense vessel or unexpected calcification.  Skull: No fracture or focal lesion.  Sinuses/Orbits: No  acute findings. Periapical lucencies are noted about multiple upper teeth.  Other: None.  IMPRESSION: No acute intracranial abnormality.   Electronically Signed   By: Keith Rake M.D.   On: 06/19/2021 22:51      PHYSICAL EXAM  Physical Exam  Constitutional: Appears well-developed and well-nourished.  Psych: Agitation and impulsive Eyes: No scleral injection HENT: No OP obstrucion MSK: no joint deformities.  Cardiovascular: Normal rate and regular rhythm.  Respiratory: Effort normal, non-labored breathing GI: Soft.  No distension. There is no tenderness.  Skin: WDI  Neuro: Mental Status: Patient is drowsy and can be aroused with some difficulties and follows only simple midline and one-step commands.  Patient is not able to give a clear and coherent history. Disoriented to time and situation.  No signs of aphasia or neglect, voice is quiet and hoarse, presumably from recent extubation.   Cranial Nerves: II: Visual Fields are full. Pupils are equal, round, and reactive to light.   III,IV, VI: EOMI without ptosis or diploplia.  V: Facial sensation is symmetric to temperature VII: Facial movement is symmetric resting and smiling VIII: Hearing is intact to voice X: Palate elevates symmetrically XI: Shoulder shrug is symmetric. XII: Tongue protrudes midline without atrophy or fasciculations.  Motor: Tone is normal. Bulk is normal. RUE 4/5   LUE 5/5 Bilateral LE 4/5 but effort is variable  and poor Sensory: Sensation is symmetric to light touch in the arms and legs. No extinction to DSS present. Plantars: Toes are downgoing bilaterally.  Cerebellar: FNF intact bilaterally   ASSESSMENT/PLAN Ms. Lauren Velazquez  is a 60 y.o. female with history of DM2, HTN, Vertigo, HLD, Anxiety, back pain, and COVID-19 presenting with altered mental status. Patient was admitted on 06/20/2021 to [facility]- intubated and transferred to ICU, extubated 11/12. On  11/16 patient had an episode of complete unresponsiveness with foaming at the mouth and posturing with acute hypotension.  She was intubated emergently and transferred to the ICU.   Patient was evaluated by neurology and stat CT and CTA head and neck were negative for acute findings, she was loaded with 4g Keppra and transferred to [facility] hospital for continuous EEG. See timeline from [place] below.  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, DDx including  Hypercoagulability with metabolic syndrome and infection - morbid obesity, uncontrolled DM, HLD, fever  Endocarditis - fever and leukocytosis, but blood culture neg, TEE no endocarditis Paradoxical emboli - TEE no PFO and LE venous doppler no acute DVT Infectious or noninfectious vasculitis - MRI no T2 FLAIR change, LP no meningitis or encephalitis, no elevated protein, CTA  and MRA neg APL - ANA neg, hypercoag pending, no hx of miscarriage ANA full panel ordered 11/19- pending  MRI head 11/17 Scattered acute and subacute infarcts in the bilateral cerebral hemispheres, largely the MCA and PCA territories, the majority of which are new compared to 06/21/2021.  MRA head 11/11- negative MRA head. Atheromatous narrowing up to 40-50% involving the right carotid bulb/prox RICA. Patent L carotid arterial system, patient vertebral arteries  CTA head and neck 11/16 - negative 2D Echo  - EF 60-65% TEE - No evidence of intraarterial shunt, no atrial appendage thrombus detected Venous duplex- Left: findings consistent with age indeterminate deep vein thrombosis involving the left posterior tibial veins LDL 133 HgbA1c 8.2 UDS neg TCD with bubble study scheduled for 07/03/2021 Hypercoagulable labs pending Heparin subq for VTE prophylaxis No antithrombotic prior to admission, now on aspirin 81 mg daily.  Ongoing aggressive stroke risk factor management Therapy recommendations:  Pending  Disposition:  Neuro ICU  Behavior  disturbance  Reported bizarre behavior in ED Agitation in ICU but following commands and answer all questions appropriately ? Seizure episodes - on keppra and vipat LTM EEG discontinued 11/19- no epileptiform discharges noted EEG x 2 diffuse encephalopathy CCM on board  Hypertension Hypertensive Put on amlodipine and lisinopril PRN Hydralazine q4 for HTN BP goal < 180/105  Hyperlipidemia Home meds:  None LDL 133, goal < 70 Statin intolerance - due to swelling Zetia 10 Continue zetia at discharge  Diabetes type II HgbA1c 8.2, goal < 7.0 Uncontrolled SSI  CBG monitoring  Other Stroke Risk Factors Obesity, Body mass index is 43.78 kg/m., recommend weight loss, diet and exercise as appropriate  Coronary artery disease Obstructive sleep apnea, 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   Other Active Problems Acute Hypoxemic Respiratory Failure and restrictive lung disease Extubated 11/18 at 0853 Initially seeing pulm outpt for post covid dsypnea Managed with CCM- appreciate recommendations  Pulmicort, anoro ellipta, albuterol Possible Aspiration Abx coverage- Unasyn Metabolic Encephalopathy Correct metabolic abnormalities Continuous Reorientation  Decreased UOP Lasix 60mg  Monitor renal function   Hospital day # 3  TIMELINE FROM [place]: 06/20/21- Admit to ICU  with suspected meningitis s/p mechanical intubation for airway protection in the setting of increased sedation needs and agitation. S/p LP @2 :37 AM. ID consult. Patient started empirically on vancomycin, ceftriaxone, ampicillin and acyclovir.  ID consult. 06/21/21- MRI w/out contrast shows acute infarcts (embolic vs. Hypotensive). Neuro, and psych consulted.  TTE showed normal  LVEF, grade I diastolic dysfunction, no sig valvular abnormalities and no intracardiac clot.  rEEG showed moderate diffuse slowing with subtle superimposed right focal slowing without epileptiform abnormalities 06/22/21-persistent agitation off  of sedation.  Started Depakene for mood stabilization and cyproheptadine for potential serotonergic symptoms. 06/23/21: Remains on the vent and sedated. 06/24/21: Remains on the vent. Neuro eval recs central embolic suspected.  Start ASA 81mg  daily + plavix 75mg  daily x21 days f/b ASA 81mg  daily after that.  Consider TEE 06/25/21: Remains on the vent and sedation.  Status post TEE negative for thrombus or vegetation. 06/26/21: S/p extubation now IVC with sitter at the bedside 06/27/21: Stable on [place] @2L  with fluctuating mental status. Sitter at bedside 06/28/21: Restart home dose of oxycodone for concern of possible withdrawal. EEG per neuro r/o seizure activity. Patient stable, able to be transferred to step down care. PCCM to sign sign 06/29/21: Transferred to Med/surg 06/30/21: Rapid response called for unresponsiveness and hypotension.  Upon arrival to ICU concern for seizure activity.  Intubated for airway protection.  Transferred to [facility] for continuous EEG, PCCM admit   Pt seen by NP/Neuro and later by MD. Note/plan to be edited by MD as needed.  06/30/21, FNP-BC [facility]  STROKE MD NOTE :  I have personally obtained history,examined this patient, reviewed notes, independently viewed imaging studies, participated in medical decision making and plan of care.ROS completed by me personally and pertinent positives fully documented  I have made any additions or clarifications directly to the above note. Agree with note above.  Patient presented with altered mental status and agitation of unclear etiology and MRI shows multiple bilateral cortical and subcortical infarcts of cryptogenic etiology.  Neurovascular work-up and TEE have been unremarkable.  Hypercoagulable and vasculitic work-up is yet pending.  Recommend checking TCD bubble study for PFO not seen on TEE.  Mobilize out of bed as tolerated.  Reduce sedation and restraints.  Discussed with patient and Dr.Olalere.This patient is  critically ill and at significant risk of neurological worsening, death and care requires constant monitoring of vital signs, hemodynamics,respiratory and cardiac monitoring, extensive review of multiple databases, frequent neurological assessment, discussion with family, other specialists and medical decision making of high complexity.I have made any additions or clarifications directly to the above note.This critical care time does not reflect procedure time, or teaching time or supervisory time of PA/NP/Med Resident etc but could involve care discussion time.  I spent 30 minutes of neurocritical care time  in the care of  this patient.      07/01/21, MD Medical Director [facility] Stroke Center Pager: [phone] 07/03/2021 3:28 PM   To contact Stroke Continuity provider, please refer to Delia Heady. After hours, contact General Neurology

## 2021-07-03 NOTE — Progress Notes (Signed)
LTM EEG discontinued - no skin breakdown at unhook.   

## 2021-07-03 NOTE — Evaluation (Signed)
Clinical/Bedside Swallow Evaluation Patient Details  Name: Lauren Velazquez MRN: [id number] Date of Birth: [birth year]  Today's Date: 07/03/2021 Time: SLP Start Time (ACUTE ONLY): 1330 SLP Stop Time (ACUTE ONLY): 1345 SLP Time Calculation (min) (ACUTE ONLY): 15 min  Past Medical History:  Past Medical History:  Diagnosis Date   Anemia    not currently under treatment   Anxiety    Asthma    during allergy season   Diabetes mellitus without complication (HCC)    Hypertension    Vertigo    Past Surgical History:  Past Surgical History:  Procedure Laterality Date   CARPAL TUNNEL RELEASE Right 2015   KNEE ARTHROSCOPY Left 05/17/2018   Procedure: ARTHROSCOPY KNEE WITH LATERAL RELEASE, PARTIAL SYNOVECTOMY;  Surgeon: Kennedy Bucker, MD;  Location: [facility] ORS;  Service: Orthopedics;  Laterality: Left;   KNEE ARTHROSCOPY WITH LATERAL RELEASE Right 12/28/2017   Procedure: KNEE ARTHROSCOPY WITH LATERAL RELEASE;  Surgeon: Kennedy Bucker, MD;  Location: [facility] ORS;  Service: Orthopedics;  Laterality: Right;   TIBIAL TUBERCLERPLASTY Bilateral 06/01/2017   Procedure: TIBIAL TUBERCLE SPUR EXCISION;  Surgeon: Kennedy Bucker, MD;  Location: [facility] ORS;  Service: Orthopedics;  Laterality: Bilateral;   HPI:  60 yo female presenting to [facility] ED on 11/6 with AMS with agitation; suspect viral meningitis. Intubated 11/6-11/12 for airway protection after sedation. Transfered to [facility] on 11/15. EEG negative for ongoing seizures. Rapid response called for unresponsiveness and hypotension on 11/16. 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 Intubated 11/16-11/18. PMH including HTN, T2DM, vertigo, anxiety, chronic back pain, and HLD.    Assessment / Plan / Recommendation  Clinical Impression  Evaluation limited by lethargy and cognitive status. Patient relatively calm, moving legs off the bed but otherwise still. Eyes remained closed for most of  evaluation although patient did respond verbally to clinician when spoken to in 75% of attempts. Oral care provided to maximize safety with po intake. Patient was able to open oral cavity and orally accept bolus (ice chip) however presents with lethargy and poor attention resulting in oral holding requiring moderte verbal cueing to oral transit of bolus and initiation of swallow. Once complete, patient with subtle throat clearing which could be indicative of decreased airway protection. Note that patient is moderately hoarse s/p intubation indicating decreased glottal closure which can decrease airway protection. No further po trials provided. SLP will f/u at bedside for continued diagnostic treatment. SLP Visit Diagnosis: Dysphagia, oropharyngeal phase (R13.12)       Diet Recommendation NPO   Medication Administration: Via alternative means    Other  Recommendations Oral Care Recommendations: Oral care QID    Recommendations for follow up therapy are one component of a multi-disciplinary discharge planning process, led by the attending physician.  Recommendations may be updated based on patient status, additional functional criteria and insurance authorization.  Follow up Recommendations  (TBD)         Functional Status Assessment Patient has had a recent decline in their functional status and demonstrates the ability to make significant improvements in function in a reasonable and predictable amount of time.  Frequency and Duration min 2x/week  2 weeks       Prognosis Prognosis for Safe Diet Advancement: Good Barriers to Reach Goals: Cognitive deficits      Swallow Study   General HPI: 60 yo female presenting to [facility] ED on 11/6 with AMS with agitation; suspect viral meningitis. Intubated 11/6-11/12 for airway protection  after sedation. Transfered to [facility] on 11/15. EEG negative for ongoing seizures. Rapid response called for unresponsiveness and hypotension on 11/16. MRI showing acute and  subacute infarcts in the bilateral cerebral hemispheres, largely the MCA and PCA territories on 11/17. Intubated 11/16-11/18. PMH including HTN, T2DM, vertigo, anxiety, chronic back pain, and HLD. Type of Study: Bedside Swallow Evaluation Previous Swallow Assessment: [facility] 11/15-recommended dys 3, thin liquid Diet Prior to this Study: NPO;NG Tube Temperature Spikes Noted: No Respiratory Status: Nasal cannula History of Recent Intubation: Yes Length of Intubations (days): 8 days (x two intubations 11/6-11/12. 11/16-11/18) Date extubated: 07/02/21 Behavior/Cognition: Requires cueing;Lethargic/Drowsy Oral Cavity Assessment: Within Functional Limits Oral Care Completed by SLP: Yes Oral Cavity - Dentition: Poor condition;Missing dentition Self-Feeding Abilities: Total assist Patient Positioning: Upright in bed Baseline Vocal Quality: Hoarse Volitional Cough: Cognitively unable to elicit Volitional Swallow: Unable to elicit    Oral/Motor/Sensory Function Overall Oral Motor/Sensory Function:  (appears WFL although unable to complete formal OME)   Ice Chips Ice chips: Impaired Presentation: Spoon Oral Phase Impairments: Poor awareness of bolus Oral Phase Functional Implications: Oral holding Pharyngeal Phase Impairments: Throat Clearing - Delayed;Throat Clearing - Immediate   Thin Liquid Thin Liquid: Not tested    Nectar Thick Nectar Thick Liquid: Not tested   Honey Thick Honey Thick Liquid: Not tested   Puree Puree: Not tested   Solid     Solid: Not tested     Lauren Haverstick MA, CCC-SLP  Lauren Velazquez 07/03/2021,2:16 PM

## 2021-07-03 NOTE — Progress Notes (Addendum)
eLink Physician-Brief Progress Note Patient Name: Lauren Velazquez DOB: 27-Jan-1961 MRN: [id number]   Date of Service  07/03/2021  HPI/Events of Note  Agitation overnight. Received 50 mg Seroquel earlier this evening. Qtc 350 ms   eICU Interventions  Haldol 5 mg x 2 IM ordered     Intervention Category Minor Interventions: Agitation / anxiety - evaluation and management  Ayame Rena Mechele Collin 07/03/2021, 12:50 AM

## 2021-07-03 NOTE — Progress Notes (Signed)
Pt extremely agitated.  Pt attempting to hit, kick and bite nurses and sitter.  Pt continuously turning sideways in bed, pushing against handrails with feet, and trying to get out of bed.  Pt is not redirectable.  Pt attempted to pull out Cortrak and IV's when restraints were loosened to allow for pt to calm down.  RN explained to pt the purpose of restraints and safety mittens.  Pt continued to be uncooperative and a threat to her own safety and the staff's safety.  Called MD.  Waiting for orders.  Will continue to monitor.

## 2021-07-03 NOTE — Procedures (Addendum)
Patient Name: Lauren Velazquez  MRN: [id number]  Epilepsy Attending: Charlsie Quest  Referring Physician/Provider: Dr. Caryl Pina Duration: 07/03/2021 0042 to 07/04/2021 0900   Patient history: 60 year old female with altered mental status and seizure.  EEG to evaluate for seizure.   Level of alertness:  awake, asleep   AEDs during EEG study: Keppra, Vimpat   Technical aspects: This EEG study was done with scalp electrodes positioned according to the 10-20 International system of electrode placement. Electrical activity was acquired at a sampling rate of 500Hz  and reviewed with a high frequency filter of 70Hz  and a low frequency filter of 1Hz . EEG data were recorded continuously and digitally stored.    Description: The posterior dominant rhythm consists of 8-9 Hz activity of moderate voltage (25-35 uV) seen predominantly in posterior head regions, symmetric and reactive to eye opening and eye closing. Sleep was characterized by vertex waves, sleep spindles (12 to 14 Hz), maximal frontocentral region. EEG showed intermittent generalized 3 to 6 Hz theta-delta slowing. Hyperventilation and photic stimulation were not performed.     ABNORMALITY - Intermittent slow, generalized  IMPRESSION: This study is suggestive of mild diffuse encephalopathy, nonspecific etiology. No seizures or epileptiform discharges were seen throughout the recording.  Sheritta Deeg 

## 2021-07-03 NOTE — Progress Notes (Signed)
[facility] ADULT ICU REPLACEMENT PROTOCOL   The patient does apply for the [facility] Adult ICU Electrolyte Replacment Protocol based on the criteria listed below:   1.Exclusion criteria: TCTS patients, ECMO patients, and Dialysis patients 2. Is GFR >/= 30 ml/min? Yes.    Patient's GFR today is >60 3. Is SCr </= 2? Yes.   Patient's SCr is 0.53 mg/dL 4. Did SCr increase >/= 0.5 in 24 hours? No. 5.Pt's weight >40kg  Yes.   6. Abnormal electrolyte(s): K+ 3.6  7. Electrolytes replaced per protocol 8.  Call MD STAT for K+ </= 2.5, Phos </= 1, or Mag </= 1 Physician:  n/a  Melvern Banker 07/03/2021 6:21 AM

## 2021-07-03 NOTE — Progress Notes (Addendum)
If symptoms get ultrasound of the uterus   NAME:  Lauren Velazquez, MRN:  [id number], DOB:  13-Nov-1960, LOS: 3 ADMISSION DATE:  06/30/2021, INITIAL CONSULTATION DATE:  06/19/2021 REFERRING MD:  Dr. Leonides Schanz Chrisitine, CHIEF COMPLAINT:  Altered Mental Status   Brief Patient Description  60 year old female presenting to [facility] ED from home via EMS for multiple complaints including stroke, chest pain, shortness of breath and pain all over. (Per ED documentation)   ED course: Per ED documentation upon arrival patient was agitated and extremely altered, exhibiting bizarre behavior.  She was screaming and yelling sticking her finger down her throat and attempt to make her self vomit.  While sitting in a recliner began attempting to flip it over eventually requiring physical restraint for her own safety.  ED staff attempted to sedate her as she was kicking and swinging arms at staff and husband.  Her main complaint to the ED provider was chest pain, she stated she was worried that she might have had a heart attack as well as feeling very dehydrated.  She also insisted on being given her chronic pain medication, however she also told staff she will take all her pills and overdose. Patient was placed under IVC.  Per ED documentation, husband denied recreational drug use or any new medications (unclear if this comment was made to EDP or EMS).  While in the ED patient became febrile and sepsis protocol was initiated.  No definitive source of infection noted in CXR/UA, this in combination with severely altered mental status raise concern for meningitis.  Due to patient's increasingly agitated state refractory to benzodiazepines the decision was made by the EDP to mechanically intubate for airway protection in order to provide further sedation so that an LP could be performed safely  Medications given: 50 mg Benadryl, 5 mg Haldol, 2 mg Ativan x2, cefepime/vancomycin/acyclovir, 10 mg of Decadron, total 2.6 L IV fluid bolus,  succinylcholine for RSI, 650 mg acetaminophen for fever Initial Vitals: Severely febrile at 103.9, tachypneic 32, tachycardic 134, hypertensive urgency 232/97 & 95% on room air EKG Interpretation: Date: 06/19/2021, EKG Time: 23:23, Rate: 121, Rhythm: Sinus tachycardia, QRS Axis: RAD, Intervals: Prolonged QTC, ST/T Wave abnormalities: None, Narrative Interpretation: Sinus tachycardia with RAD Chemistry: Na+: 134, K+: 3.9, BUN/Cr.:  9/0.76, Serum CO2/ AG: 28/9 Hematology: WBC: 14.4, Hgb: 12.2,  Normal troponin: 8> 15,  Lactic/ PCT: 4.5/pending, COVID-19 & Influenza A/B: negative ABG: 7.46/32/177/22.8 CXR 06/20/21: mild central vascular congestion CT head wo contrast 06/20/21: no acute intracranial abnormality   On 11/16 Pt had been transferred to the floor and had been intermittently drowsy throughout the day, then had an episode of complete unresponsiveness with foaming at the mouth and posturing with acute hypotension.  She was intubated emergently and transferred to the ICU.   Patient was evaluated by neurology and stat CT and CTA head and neck were negative for acute findings, she was loaded with 4g Keppra and transferred to [facility] hospital for continuous EEG.  Pertinent  Medical History  T2DM HTN Vertigo HLD Anxiety Chronic Back pain COVID-19   Significant Hospital Events: Including procedures, antibiotic start and stop dates in addition to other pertinent events   06/20/21- Admit to ICU  with suspected meningitis s/p mechanical intubation for airway protection in the setting of increased sedation needs and agitation. S/p LP @2 :37 AM. ID consult. Patient started empirically on vancomycin, ceftriaxone, ampicillin and acyclovir.  ID consult. 06/21/21- MRI w/out contrast shows acute infarcts (embolic vs. Hypotensive). Neuro, and psych consulted.  TTE showed normal LVEF, grade I diastolic dysfunction, no sig valvular abnormalities and no intracardiac clot.  rEEG showed moderate diffuse slowing with  subtle superimposed right focal slowing without epileptiform abnormalities 06/22/21-persistent agitation off of sedation.  Started Depakene for mood stabilization and cyproheptadine for potential serotonergic symptoms. 06/23/21: Remains on the vent and sedated. 06/24/21: Remains on the vent. Neuro eval recs central embolic suspected.  Start ASA 81mg  daily + plavix 75mg  daily x21 days f/b ASA 81mg  daily after that.  Consider TEE 06/25/21: Remains on the vent and sedation.  Status post TEE negative for thrombus or vegetation. 06/26/21: S/p extubation now IVC with sitter at the bedside 06/27/21: Stable on [place] @2L  with fluctuating mental status. Sitter at bedside 06/28/21: Restart home dose of oxycodone for concern of possible withdrawal. EEG per neuro r/o seizure activity. Patient stable, able to be transferred to step down care. PCCM to sign sign 06/29/21: Transferred to Med/surg 06/30/21: Rapid response called for unresponsiveness and hypotension.  Upon arrival to ICU concern for seizure activity.  Intubated for airway protection.  Transferred to [facility] for continuous EEG, PCCM admit 11/17 no further seizures. More scattered punctate infarcts on MRI compared to 11/7  11/18 extubated   Cultures:  11/6>> CSF- mild pleocytosis. Culture- no growth at 4 days. VDRL- negative.  11/6>> blood cultures- negative (no growth @4d ) 11/6>> MRSA PCR - negative  11/6>> HSV1/2- negative  11/6>> Crypotococcal Ag - negative   11/16 resp culture >   Antimicrobials:  11/6 Vancomycin >> 11/10 11/6 Ampicillin >>11/13 11/6 Ceftriaxone >>11/14 11/6 Acyclovir >>11/9 11/6 Cefepime >> x 1 dose 11/16 unasyn >   Interim History / Subjective:  Agitation overnight requiring Haldol, Seroquel  redirectable, complains of some pain  OBJECTIVE   Blood pressure (!) 164/78, pulse 86, temperature 98.5 F (36.9 C), temperature source Oral, resp. rate (!) 25, height 5\' 1"  (1.549 m), weight 105.1 kg, last menstrual period  08/15/2008, SpO2 96 %.        Intake/Output Summary (Last 24 hours) at 07/03/2021 1028 Last data filed at 07/03/2021 0800 Gross per 24 hour  Intake 2416.69 ml  Output 3775 ml  Net -1358.31 ml    General: Obese, chronically ill-appearing, on nasal cannula Neuro: Moving all extremities HEENT: Moist oral mucosa Cardiovascular: rrr s1s2 no rgm  Lungs: Clear breath sounds Abdomen: obese soft round ndnt Musculoskeletal: No deformity Skin: c/d/w. Flushed face  Psych: Agitated, redirectable  Labs reviewed Medications reviewed  Labs/imaging that I havepersonally reviewed  (right click and "Reselect all SmartList Selections" daily)  CXR 06/20/21: mild central vascular congestion CT head wo contrast 06/20/21: no acute intracranial abnormality CT abd/pelvis 06/20/21: No evidence of inflammatory process or abscess. Evidence of cholelithiasis, bilateral nephrolithiasis, 5.4 cm benign-appearing left adnexal mass.   CSF 06/20/21: mild pleocytosis MR brain w/out 06/21/21: Scattered punctate acute infarcts in both cerebral hemispheres (bilateral PCA and left MCA territories). No associated hemorrhage or mass effect. Although the posterior fossa and anterior cerebral artery territories are spared, the pattern is suspicious for a recent embolic event. Hypotensive episode might also produce this appearance. 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  Echo 06/21/21: EF 60-65%, otherwise normal.  EEG 06/21/21: moderate diffuse slowing, focal right sided slowing, no epileptiform abnormalities.  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 Wide patency of the left carotid artery system within the neck. Widely patent  vertebral arteries within the neck. TEE 06/25/2021: negative for thrombus or vegetation  CTA Head and Neck 11/16: No acute intracranial process  or significant stenosis MRI Brain 11/17> scattered acute and subacute bilateral infarcts, increased in number from 11/7 ASSESSMENT & PLAN   Acute metabolic encephalopathy -Felt made medication related  Chronically on opiates and Neurontin -Will reintroduce slowly  With significant agitation and danger to self -Currently have restraints in place -Start on Precedex -RASS goal of 0 to -1  Acute on subacute bilateral punctate infarcts -TEE negative -For bubble study today -Appreciate neurology follow-up -Discussed with Dr. Pearlean Brownie at bedside -Vasculitic work-up ordered -Hypercoagulable panel work-up ordered  Possible seizure with postictal state -Continue Vimpat, Keppra  Acute hypoxic respiratory failure -Successfully extubated 11/18 -Continue oxygen supplementation -Possible aspiration-5 days of antibiotics  Hypertension -Was on lisinopril at home -We will reinitiate -As needed hydralazine and labetalol  Diabetes Poorly controlled -SSI -Lantus added  Chronic pain on chronic opiate use -Reinitiate tramadol to be used as needed -Neurontin added  Informed about vaginal bleeding -Obtain pelvic ultrasound and monitor   Best practice (right click and "Reselect all SmartList Selections" daily)  Diet:  NPO Pain/Anxiety/Delirium protocol (if indicated): n/a VAP protocol (if indicated): Yes DVT prophylaxis: LMWH GI prophylaxis: H2B Glucose control:  SSI Yes Central venous access:  N/A Arterial line:  NO Foley:  Yes, and it is still needed  Mobility:  bed rest  PT consulted:  Last date of multidisciplinary goals of care discussion [06/26/21] Code Status:  full code Disposition: ICU  CRITICAL CARE Performed by: Danaly Bari A Rosaleigh Brazzel  The patient is critically ill with multiple organ systems failure and requires high complexity decision making for assessment and support, frequent evaluation and titration of therapies, application of advanced monitoring technologies and  extensive interpretation of multiple databases. Critical Care Time devoted to patient care services described in this note independent of APP/resident time (if applicable)  is 33 minutes.   Virl Diamond MD  Pulmonary Critical Care Personal pager: See Amion If unanswered, please page CCM On-call: #[phone]

## 2021-07-04 ENCOUNTER — Inpatient Hospital Stay (HOSPITAL_COMMUNITY): Payer: BLUE CROSS/BLUE SHIELD

## 2021-07-04 DIAGNOSIS — N950 Postmenopausal bleeding: Secondary | ICD-10-CM

## 2021-07-04 DIAGNOSIS — G9340 Encephalopathy, unspecified: Secondary | ICD-10-CM | POA: Diagnosis not present

## 2021-07-04 DIAGNOSIS — I6300 Cerebral infarction due to thrombosis of unspecified precerebral artery: Secondary | ICD-10-CM | POA: Diagnosis not present

## 2021-07-04 DIAGNOSIS — R569 Unspecified convulsions: Secondary | ICD-10-CM | POA: Diagnosis not present

## 2021-07-04 DIAGNOSIS — R0689 Other abnormalities of breathing: Secondary | ICD-10-CM | POA: Diagnosis not present

## 2021-07-04 LAB — GLUCOSE, CAPILLARY
Glucose-Capillary: 223 mg/dL — ABNORMAL HIGH (ref 70–99)
Glucose-Capillary: 210 mg/dL — ABNORMAL HIGH (ref 70–99)
Glucose-Capillary: 207 mg/dL — ABNORMAL HIGH (ref 70–99)
Glucose-Capillary: 203 mg/dL — ABNORMAL HIGH (ref 70–99)
Glucose-Capillary: 167 mg/dL — ABNORMAL HIGH (ref 70–99)

## 2021-07-04 LAB — BETA-2-GLYCOPROTEIN I ABS, IGG/M/A
Beta-2 Glyco I IgG: <9 GPI IgG units (ref 0–20)
Beta-2-Glycoprotein I IgA: <9 GPI IgA units (ref 0–25)
Beta-2-Glycoprotein I IgM: <9 GPI IgM units (ref 0–32)

## 2021-07-04 MED ORDER — SODIUM CHLORIDE 0.9% FLUSH
10.0000 mL | Freq: Two times a day (BID) | INTRAVENOUS | Status: DC
  Administered 2021-07-04 – 2021-07-20 (×32): 10 mL

## 2021-07-04 MED ORDER — SODIUM CHLORIDE 0.9% FLUSH: 10.0000 mL | INTRAVENOUS | Status: DC | PRN

## 2021-07-04 MED ORDER — INSULIN GLARGINE-YFGN 100 UNIT/ML ~~LOC~~ SOLN
30.0000 [IU] | Freq: Two times a day (BID) | SUBCUTANEOUS | Status: DC
Start: 2021-07-04 — End: 2021-07-05
  Administered 2021-07-04 (×2): 30 [IU] via SUBCUTANEOUS
  Filled 2021-07-04 (×4): qty 0.3

## 2021-07-04 MED ORDER — HEPARIN SODIUM (PORCINE) 5000 UNIT/ML IJ SOLN
5000.0000 [IU] | Freq: Three times a day (TID) | INTRAMUSCULAR | Status: DC
  Administered 2021-07-05 – 2021-07-06 (×4): 5000 [IU] via SUBCUTANEOUS
  Filled 2021-07-04 (×7): qty 1

## 2021-07-04 NOTE — Progress Notes (Signed)
LTM maint complete - no skin breakdown under:  FP1 FP2 .continue to monitor

## 2021-07-04 NOTE — Progress Notes (Signed)
STAT LTM EEG hooked up and running - no initial skin breakdown - push button tested - neuro notified. [facility] monitoring.  

## 2021-07-04 NOTE — Progress Notes (Addendum)
STROKE TEAM PROGRESS NOTE   SUBJECTIVE (INTERVAL HISTORY) No family is present at the bedside. EEG restarted 11/20- Per RN- patient had two episodes of seizure like activity. Patient was bolused with 2g of keppra and given 2mg  of ativan.  Patient continues quite agitated today requiring restraints and sedation.  This morning she is demanding to go home but cooperative in following commands and she is moving all 4 extremities well.  Vital signs stable.  Neurological exam is unchanged. She will not had any further seizures this morning OBJECTIVE Vitals:   07/04/21 0900 07/04/21 0927 07/04/21 1000 07/04/21 1100  BP: 126/61  122/78 132/74  Pulse: (!) 46  (!) 49 (!) 55  Resp: (!) 22  (!) 22 15  Temp:      TempSrc:      SpO2: 100% 100% 100% 98%  Weight:      Height:        CBC:  Recent Labs  Lab 07/02/21 0355 07/03/21 0342  WBC 9.6 11.6*  NEUTROABS 8.0* 9.1*  HGB 8.9* 9.4*  HCT 31.0* 32.4*  MCV 84.7 82.4  PLT 407* 483*     Basic Metabolic Panel:  Recent Labs  Lab 07/01/21 0330 07/02/21 0355 07/03/21 0342  NA 141 140 141  K 4.0 4.4 3.6  CL 104 108 103  CO2 27 25 28   GLUCOSE 114* 260* 223*  BUN 10 17 9   CREATININE 0.84 0.67 0.53  CALCIUM 8.8* 8.7* 8.8*  MG 2.5* 2.5*  --   PHOS 3.5 3.0  --      Lipid Panel: No results for input(s): CHOL, TRIG, HDL, CHOLHDL, VLDL, LDLCALC in the last 168 hours. HgbA1c:  Lab Results  Component Value Date   HGBA1C 8.2 (H) 06/20/2021   Urine Drug Screen:     Component Value Date/Time   LABOPIA NONE DETECTED 06/19/2021 2257   COCAINSCRNUR NONE DETECTED 06/19/2021 2257   LABBENZ NONE DETECTED 06/19/2021 2257   AMPHETMU NONE DETECTED 06/19/2021 2257   THCU NONE DETECTED 06/19/2021 2257   LABBARB NONE DETECTED 06/19/2021 2257    Alcohol Level No results found for: [facility]  IMAGING  Results for orders placed or performed during the hospital encounter of 06/30/21  MR BRAIN WO CONTRAST   Narrative   CLINICAL DATA:  Mental status  change, unknown cause  EXAM: MRI HEAD WITHOUT CONTRAST  TECHNIQUE: Multiplanar, multiecho pulse sequences of the brain and surrounding structures were obtained without intravenous contrast.  COMPARISON:  06/21/2021.  FINDINGS: Brain: Scattered foci of restricted diffusion with ADC correlate, largely cortical, in both cerebral hemispheres, most of which are punctate, with larger areas of infarction right parietal lobe (series 5, image 85) and left occipital lobe (series 5, image 68). The largest areas are also associated with increased T2 signal and may be subacute. The vast majority of these are new compared to 06/21/2021, with only 1 seen on the prior exam (right inferior parietal lobe lobe, posterior to the right lateral ventricle). The bilateral frontal lobes appear least affected. No foci of restricted diffusion in the cerebellum.  No acute hemorrhage, mass, mass effect, or midline shift. No hydrocephalus or extra-axial collection.  Vascular: Normal flow voids.  Skull and upper cervical spine: Normal marrow signal.  Sinuses/Orbits: Mucosal thickening in the ethmoid air cells, maxillary sinuses, and sphenoid sinuses. The orbits are unremarkable.  Other: Fluid throughout the bilateral mastoid air cells.  IMPRESSION: Scattered acute and subacute infarcts in the bilateral cerebral hemispheres, largely the MCA and PCA territories, the  majority of which are new compared to 06/21/2021. The 2 largest areas of infarction, in the right parietal and left occipital lobes, are associated with increased T2 signal and are likely subacute. Given multiple vascular territories, embolic etiology is suspected, although a hypotensive episode could also produce this appearance.   Electronically Signed   By: Merilyn Baba M.D.   On: 07/01/2021 11:55   Results for orders placed or performed during the hospital encounter of 06/19/21  MR BRAIN W WO CONTRAST   Narrative   CLINICAL DATA:   60 year old female with unexplained altered mental status. Sepsis.  EXAM: MRI HEAD WITHOUT AND WITH CONTRAST  TECHNIQUE: Multiplanar, multiecho pulse sequences of the brain and surrounding structures were obtained without and with intravenous contrast.  CONTRAST:  49mL GADAVIST GADOBUTROL 1 MMOL/ML IV SOLN  COMPARISON:  Head CT 06/19/2021.  FINDINGS: Brain: Scattered small cortical and occasionally white matter areas of restricted diffusion in both cerebral hemispheres. Many are punctate. Bilateral PCA, left MCA territories are affected. Minimal associated T2 and FLAIR hyperintensity. But no deep gray matter nuclei, brainstem or cerebellar involvement.  Superimposed small chronic lacunar infarct of the left caudate nucleus. Mild patchy bilateral periatrial white matter T2 and FLAIR hyperintensity.  No acute or chronic intracranial hemorrhage. No midline shift, mass effect, evidence of mass lesion, ventriculomegaly, extra-axial collection. Cervicomedullary junction and pituitary are within normal limits.  No abnormal enhancement identified.  No dural thickening.  Vascular: Major intracranial vascular flow voids are preserved. The major dural venous sinuses are enhancing and appear to be patent.  Skull and upper cervical spine: Negative visible cervical spine. Visualized bone marrow signal is within normal limits.  Sinuses/Orbits: Negative orbits. Trace paranasal sinus mucosal thickening.  Other: Mastoids are clear. Visible internal auditory structures appear normal. Intubated. Small volume retained secretions in the nasopharynx.  IMPRESSION: 1. Scattered punctate acute infarcts in both cerebral hemispheres (bilateral PCA and left MCA territories). No associated hemorrhage or mass effect. Although the posterior fossa and anterior cerebral artery territories are spared, the pattern is suspicious for a recent Embolic event. Hypotensive episode might also produce  this appearance.  2. 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   Electronically Signed   By: Genevie Ann M.D.   On: 06/21/2021 05:31   MR ANGIO HEAD WO CONTRAST   Narrative   CLINICAL DATA:  Initial evaluation for carotid stenosis screening.  EXAM: MRA NECK WITHOUT AND WITH CONTRAST  MRA HEAD WITHOUT CONTRAST  TECHNIQUE: Multiplanar and multiecho pulse sequences of the neck were obtained without and with intravenous contrast. Angiographic images of the neck were obtained using MRA technique without and with intravenous contrast; Angiographic images of the Circle of Willis were obtained using MRA technique without intravenous contrast.  CONTRAST:  44mL GADAVIST GADOBUTROL 1 MMOL/ML IV SOLN  COMPARISON:  Comparison made with prior brain MRI from 06/21/2021  FINDINGS: MRA NECK FINDINGS  AORTIC ARCH: Visualized aortic arch normal caliber with normal branch pattern. No hemodynamically significant stenosis about the origin of the great vessels.  RIGHT CAROTID SYSTEM: Right CCA patent from its origin to the bifurcation without stenosis. Atheromatous narrowing at the right carotid bulb/proximal right ICA with associated stenosis of up to 40-50% by NASCET criteria (series 1126, image 1). Right ICA otherwise patent distally without stenosis, evidence for dissection or occlusion.  LEFT CAROTID SYSTEM: Left CCA patent from its origin to the bifurcation without stenosis. No significant atheromatous narrowing or  irregularity about the left carotid bulb. Left ICA patent distally without stenosis, evidence for dissection or occlusion.  VERTEBRAL ARTERIES: Both vertebral arteries arise from the subclavian arteries. Vertebral arteries are largely codominant. Vertebral arteries mildly tortuous but are widely patent without stenosis evidence for dissection or occlusion.  MRA HEAD FINDINGS  ANTERIOR  CIRCULATION:  Distal cervical segments of the internal carotid arteries are widely patent with antegrade flow. Petrous, cavernous, and supraclinoid segments widely patent without stenosis or other abnormality. A1 segments patent bilaterally. Normal anterior communicating artery complex. Mild irregularity about the ACAs favored to be related to motion artifact. ACAs patent to their distal aspects without significant stenosis. No M1 stenosis or occlusion. Normal MCA bifurcations. Distal MCA branches well perfused and symmetric.  POSTERIOR CIRCULATION:  Both vertebral arteries patent to the vertebrobasilar junction without stenosis. Both PICA origins patent and normal. Basilar patent to its distal aspect without stenosis. Superior cerebellar arteries patent bilaterally. Both PCAs supplied via hypoplastic P1 segments and robust bilateral posterior communicating arteries. PCAs well perfused to their distal aspects without stenosis.  No intracranial aneurysm.  IMPRESSION: MRA HEAD IMPRESSION:  Negative intracranial MRA. No large vessel occlusion or hemodynamically significant stenosis.  MRA NECK IMPRESSION:  1. 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 2. Wide patency of the left carotid artery system within the neck. 3. Widely patent vertebral arteries within the neck.   Electronically Signed   By: Jeannine Boga M.D.   On: 06/25/2021 06:12   CT Head Wo Contrast   Narrative   CLINICAL DATA:  Altered mental status.  EXAM: CT HEAD WITHOUT CONTRAST  TECHNIQUE: Contiguous axial images were obtained from the base of the skull through the vertex without intravenous contrast.  COMPARISON:  None.  FINDINGS: Brain: Patient was scanned in the decubitus position. No intracranial hemorrhage, mass effect, or midline shift. No hydrocephalus. The basilar cisterns are patent. No evidence of territorial  infarct or acute ischemia. No extra-axial or intracranial fluid collection.  Vascular: No hyperdense vessel or unexpected calcification.  Skull: No fracture or focal lesion.  Sinuses/Orbits: No acute findings. Periapical lucencies are noted about multiple upper teeth.  Other: None.  IMPRESSION: No acute intracranial abnormality.   Electronically Signed   By: Keith Rake M.D.   On: 06/19/2021 22:51      PHYSICAL EXAM  Physical Exam  Constitutional: Obese middle-aged Caucasian lady who was agitated, restless and in restraints Psych: Agitation and impulsive Eyes: No scleral injection HENT: No OP obstrucion MSK: no joint deformities.  Cardiovascular: Normal rate and regular rhythm.  Respiratory: Effort normal, non-labored breathing GI: Soft.  No distension. There is no tenderness.  Skin: WDI  Neuro: Mental Status: Patient is drowsy and can be aroused with some difficulties and follows only simple midline and one-step commands.  She has easy distractibility, diminished attention, registration and recall.  She has poor insight into her illness History. Disoriented to time and situation.  No signs of aphasia or neglect, voice is quiet and hoarse, presumably from recent extubation.   Cranial Nerves: II: Visual Fields are full. Pupils are equal, round, and reactive to light.   III,IV, VI: EOMI without ptosis or diploplia.  V: Facial sensation is symmetric to temperature VII: Facial movement is symmetric resting and smiling VIII: Hearing is intact to voice X: Palate elevates symmetrically XI: Shoulder shrug is symmetric. XII: Tongue protrudes midline without atrophy or fasciculations.  Motor: Tone is normal. Bulk is normal.  RUE 4/5   LUE 5/5 Bilateral LE 4/5 but effort is variable  and poor Sensory: Sensation is symmetric to light touch in the arms and legs. No extinction to DSS present. Plantars: Toes are downgoing bilaterally.  Cerebellar: FNF intact  bilaterally   ASSESSMENT/PLAN Lauren Velazquez is a 60 y.o. female with history of DM2, HTN, Vertigo, HLD, Anxiety, back pain, and COVID-19 presenting with altered mental status. Patient was admitted on 06/20/2021 to [facility]- intubated and transferred to ICU, extubated 11/12. On 11/16 patient had an episode of complete unresponsiveness with foaming at the mouth and posturing with acute hypotension.  She was intubated emergently and transferred to the ICU.   Patient was evaluated by neurology and stat CT and CTA head and neck were negative for acute findings, she was loaded with 4g Keppra and transferred to [facility] hospital for continuous EEG. See timeline from [place] below.  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, DDx including  Hypercoagulability with metabolic syndrome and infection - morbid obesity, uncontrolled DM, HLD, fever  Endocarditis - fever and leukocytosis, but blood culture neg, TEE no endocarditis Paradoxical emboli - TEE no PFO and LE venous doppler no acute DVT Infectious or noninfectious vasculitis - MRI no T2 FLAIR change, LP no meningitis or encephalitis, no elevated protein, CTA  and MRA neg APL - ANA neg, hypercoag pending, no hx of miscarriage ANA full panel ordered 11/19- pending  MRI head 11/17 Scattered acute and subacute infarcts in the bilateral cerebral hemispheres, largely the MCA and PCA territories, the majority of which are new compared to 06/21/2021.  MRA head 11/11- negative MRA head. Atheromatous narrowing up to 40-50% involving the right carotid bulb/prox RICA. Patent L carotid arterial system, patient vertebral arteries  CTA head and neck 11/16 - negative 2D Echo  - EF 60-65% TEE - No evidence of intraarterial shunt, no atrial appendage thrombus detected Venous duplex- Left: findings consistent with age indeterminate deep vein thrombosis involving the left posterior tibial veins LDL 133 HgbA1c 8.2 UDS  neg TCD with bubble study scheduled for 07/03/2021 Hypercoagulable labs pending Heparin subq for VTE prophylaxis No antithrombotic prior to admission, now on aspirin 81 mg daily.  Ongoing aggressive stroke risk factor management Therapy recommendations:  Pending  Disposition:  Neuro ICU  Behavior disturbance  Reported bizarre behavior in ED Agitation in ICU but following commands and answer all questions appropriately Seizure episodes - on keppra and vipat LTM EEG discontinued 11/19- no epileptiform discharges noted EEG x 2 diffuse encephalopathy Seizure activity reported 11/19 overnight. 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 Endoometrium 6 mm no discrete lesion.  Hospital day # 4  TIMELINE FROM [place]: 06/20/21- Admit to ICU  with suspected meningitis s/p mechanical intubation  for airway protection in the setting of increased sedation needs and agitation. S/p LP @2 :37 AM. ID consult. Patient started empirically on vancomycin, ceftriaxone, ampicillin and acyclovir.  ID consult. 06/21/21- MRI w/out contrast shows acute infarcts (embolic vs. Hypotensive). Neuro, and psych consulted.  TTE showed normal LVEF, grade I diastolic dysfunction, no sig valvular abnormalities and no intracardiac clot.  rEEG showed moderate diffuse slowing with subtle superimposed right focal slowing without epileptiform abnormalities 06/22/21-persistent agitation off of sedation.  Started Depakene for mood stabilization and cyproheptadine for potential serotonergic symptoms. 06/23/21: Remains on the vent and sedated. 06/24/21: Remains on the vent. Neuro eval recs central embolic suspected.  Start ASA 81mg  daily + plavix 75mg  daily x21 days f/b ASA 81mg  daily after that.  Consider TEE 06/25/21: Remains on the vent and sedation.  Status post TEE negative for thrombus or vegetation. 06/26/21: S/p extubation now IVC with sitter at the bedside 06/27/21: Stable on [place] @2L  with fluctuating mental status. Sitter at bedside 06/28/21: Restart home dose of oxycodone for concern of possible withdrawal. EEG per neuro r/o seizure activity. Patient stable, able to be transferred to step down care. PCCM to sign sign 06/29/21: Transferred to Med/surg 06/30/21: Rapid response called for unresponsiveness and hypotension.  Upon arrival to ICU concern for seizure activity.  Intubated for airway protection.  Transferred to [facility] for continuous EEG, PCCM admit   Pt seen by NP/Neuro and later by MD. Note/plan to be edited by MD as needed.  Lauren Ores, FNP-BC [facility]  I have personally obtained history,examined this patient, reviewed notes, independently viewed imaging studies, participated in medical decision making and plan of care.ROS completed by me personally and pertinent positives fully documented  I have  made any additions or clarifications directly to the above note. Agree with note above.  Continue IV Keppra and Vimpat for seizures.  Follow results of overnight EEG monitoring if negative will discontinue it later.  Continue management of agitation and sedation as per critical care team.  No family available at the bedside for discussion.  Discussed with Dr.Olalere critical care medicine.This patient is critically ill and at significant risk of neurological worsening, death and care requires constant monitoring of vital signs, hemodynamics,respiratory and cardiac monitoring, extensive review of multiple databases, frequent neurological assessment, discussion with family, other specialists and medical decision making of high complexity.I have made any additions or clarifications directly to the above note.This critical care time does not reflect procedure time, or teaching time or supervisory time of PA/NP/Med Resident etc but could involve care discussion time.  I spent 32 minutes of neurocritical care time  in the care of  this patient.      Antony Contras, MD Medical Director [facility] Stroke [facility] Pager: [phone] 07/04/2021 1:40 PM    To contact Stroke Continuity provider, please refer to [email or web address] After hours, contact General Neurology

## 2021-07-04 NOTE — Procedures (Signed)
Patient Name: Lauren Velazquez  MRN: [id number]  Epilepsy Attending: Charlsie Quest  Referring Physician/Provider: Dr. Caryl Pina Duration: 07/04/2021 0147 to 07/05/2021 0147   Patient history: 60 year old female with altered mental status and seizure.  EEG to evaluate for seizure.   Level of alertness:  awake, asleep   AEDs during EEG study: Keppra, Vimpat   Technical aspects: This EEG study was done with scalp electrodes positioned according to the 10-20 International system of electrode placement. Electrical activity was acquired at a sampling rate of 500Hz  and reviewed with a high frequency filter of 70Hz  and a low frequency filter of 1Hz . EEG data were recorded continuously and digitally stored.    Description: The posterior dominant rhythm consists of 8-9 Hz activity of moderate voltage (25-35 uV) seen predominantly in posterior head regions, symmetric and reactive to eye opening and eye closing. Sleep was characterized by vertex waves, sleep spindles (12 to 14 Hz), maximal frontocentral region. EEG showed intermittent generalized and maximal right temporal region 3 to 6 Hz theta-delta slowing. Hyperventilation and photic stimulation were not performed.      ABNORMALITY - Intermittent slow, generalized and maximal right temporal region   IMPRESSION: This study is suggestive of cortical dysfunction in right temporal region, non specific etiology as well as mild diffuse encephalopathy, nonspecific etiology. No seizures or definite epileptiform discharges were seen throughout the recording.   Lauren Velazquez 

## 2021-07-04 NOTE — Progress Notes (Signed)
If symptoms get ultrasound of the uterus   NAME:  Lauren Velazquez, MRN:  [id number], DOB:  [birth year], LOS: 4 ADMISSION DATE:  06/30/2021, INITIAL CONSULTATION DATE:  06/19/2021 REFERRING MD:  Dr. Leonides Schanz Chrisitine, CHIEF COMPLAINT:  Altered Mental Status   Brief Patient Description  60 year old female presenting to [facility] ED from home via EMS for multiple complaints including stroke, chest pain, shortness of breath and pain all over. (Per ED documentation)   ED course: Per ED documentation upon arrival patient was agitated and extremely altered, exhibiting bizarre behavior.  She was screaming and yelling sticking her finger down her throat and attempt to make her self vomit.  While sitting in a recliner began attempting to flip it over eventually requiring physical restraint for her own safety.  ED staff attempted to sedate her as she was kicking and swinging arms at staff and husband.  Her main complaint to the ED provider was chest pain, she stated she was worried that she might have had a heart attack as well as feeling very dehydrated.  She also insisted on being given her chronic pain medication, however she also told staff she will take all her pills and overdose. Patient was placed under IVC.  Per ED documentation, husband denied recreational drug use or any new medications (unclear if this comment was made to EDP or EMS).  While in the ED patient became febrile and sepsis protocol was initiated.  No definitive source of infection noted in CXR/UA, this in combination with severely altered mental status raise concern for meningitis.  Due to patient's increasingly agitated state refractory to benzodiazepines the decision was made by the EDP to mechanically intubate for airway protection in order to provide further sedation so that an LP could be performed safely  Medications given: 50 mg Benadryl, 5 mg Haldol, 2 mg Ativan x2, cefepime/vancomycin/acyclovir, 10 mg of Decadron, total 2.6 L IV fluid bolus,  succinylcholine for RSI, 650 mg acetaminophen for fever Initial Vitals: Severely febrile at 103.9, tachypneic 32, tachycardic 134, hypertensive urgency 232/97 & 95% on room air EKG Interpretation: Date: 06/19/2021, EKG Time: 23:23, Rate: 121, Rhythm: Sinus tachycardia, QRS Axis: RAD, Intervals: Prolonged QTC, ST/T Wave abnormalities: None, Narrative Interpretation: Sinus tachycardia with RAD Chemistry: Na+: 134, K+: 3.9, BUN/Cr.:  9/0.76, Serum CO2/ AG: 28/9 Hematology: WBC: 14.4, Hgb: 12.2,  Normal troponin: 8> 15,  Lactic/ PCT: 4.5/pending, COVID-19 & Influenza A/B: negative ABG: 7.46/32/177/22.8 CXR 06/20/21: mild central vascular congestion CT head wo contrast 06/20/21: no acute intracranial abnormality  On 11/16 Pt had been transferred to the floor and had been intermittently drowsy throughout the day, then had an episode of complete unresponsiveness with foaming at the mouth and posturing with acute hypotension.  She was intubated emergently and transferred to the ICU.   Patient was evaluated by neurology and stat CT and CTA head and neck were negative for acute findings, she was loaded with 4g Keppra and transferred to [facility] hospital for continuous EEG. 11/19: 2 witnessed seizures, back on EEG monitoring  Pertinent  Medical History  T2DM HTN Vertigo HLD Anxiety Chronic Back pain COVID-19   Significant Hospital Events: Including procedures, antibiotic start and stop dates in addition to other pertinent events   06/20/21- Admit to ICU  with suspected meningitis s/p mechanical intubation for airway protection in the setting of increased sedation needs and agitation. S/p LP @2 :37 AM. ID consult. Patient started empirically on vancomycin, ceftriaxone, ampicillin and acyclovir.  ID consult. 06/21/21- MRI w/out contrast shows acute infarcts (  embolic vs. Hypotensive). Neuro, and psych consulted.  TTE showed normal LVEF, grade I diastolic dysfunction, no sig valvular abnormalities and no intracardiac  clot.  rEEG showed moderate diffuse slowing with subtle superimposed right focal slowing without epileptiform abnormalities 06/22/21-persistent agitation off of sedation.  Started Depakene for mood stabilization and cyproheptadine for potential serotonergic symptoms. 06/23/21: Remains on the vent and sedated. 06/24/21: Remains on the vent. Neuro eval recs central embolic suspected.  Start ASA 81mg  daily + plavix 75mg  daily x21 days f/b ASA 81mg  daily after that.  Consider TEE 06/25/21: Remains on the vent and sedation.  Status post TEE negative for thrombus or vegetation. 06/26/21: S/p extubation now IVC with sitter at the bedside 06/27/21: Stable on [place] @2L  with fluctuating mental status. Sitter at bedside 06/28/21: Restart home dose of oxycodone for concern of possible withdrawal. EEG per neuro r/o seizure activity. Patient stable, able to be transferred to step down care.  06/29/21: Transferred to Med/surg 06/30/21: Rapid response called for unresponsiveness and hypotension.  Upon arrival to ICU concern for seizure activity.  Intubated for airway protection.  Transferred to [facility] for continuous EEG, PCCM admit 11/17 no further seizures. More scattered punctate infarcts on MRI compared to 11/7  11/18 extubated  11/19 had 2 witnessed seizures, loaded with Keppra, on EEG monitoring  Cultures:  11/6>> CSF- mild pleocytosis. Culture- no growth at 4 days. VDRL- negative.  11/6>> blood cultures- negative (no growth @4d ) 11/6>> MRSA PCR - negative  11/6>> HSV1/2- negative  11/6>> Crypotococcal Ag - negative   11/16 resp culture >   Antimicrobials:  11/6 Vancomycin >> 11/10 11/6 Ampicillin >>11/13 11/6 Ceftriaxone >>11/14 11/6 Acyclovir >>11/9 11/6 Cefepime >> x 1 dose 11/16 unasyn -11/20  Interim History / Subjective:  Seizures on 11/19 Now sedate On Precedex   OBJECTIVE   Blood pressure 138/65, pulse (!) 48, temperature 98.6 F (37 C), temperature source Axillary, resp. rate 14, height  5\' 1"  (1.549 m), weight 105.1 kg, last menstrual period 08/15/2008, SpO2 96 %.        Intake/Output Summary (Last 24 hours) at 07/04/2021 [id number] Last data filed at 07/04/2021 0800 Gross per 24 hour  Intake 3840.88 ml  Output 1410 ml  Net 2430.88 ml    General: Obese, chronically ill-appearing, on nasal cannula  neuro: Sedated, responsive HEENT: Moist oral mucosa Cardiovascular: rrr s1s2 no rgm  Lungs: Clear breath sounds Abdomen: Obese, bowel sounds appreciated Musculoskeletal: No deformity Skin: c/d/w. Flushed face  Psych: Sedate  Labs reviewed Medications reviewed  Labs/imaging that I havepersonally reviewed  (right click and "Reselect all SmartList Selections" daily)  CXR 06/20/21: mild central vascular congestion CT head wo contrast 06/20/21: no acute intracranial abnormality CT abd/pelvis 06/20/21: No evidence of inflammatory process or abscess. Evidence of cholelithiasis, bilateral nephrolithiasis, 5.4 cm benign-appearing left adnexal mass.   CSF 06/20/21: mild pleocytosis MR brain w/out 06/21/21: Scattered punctate acute infarcts in both cerebral hemispheres (bilateral PCA and left MCA territories). No associated hemorrhage or mass effect. Although the posterior fossa and anterior cerebral artery territories are spared, the pattern is suspicious for a recent embolic event. Hypotensive episode might also produce this appearance. 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  Echo 06/21/21: EF 60-65%, otherwise normal.  EEG 06/21/21: moderate diffuse slowing, focal right sided slowing, no epileptiform abnormalities.  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 Wide patency of  the left carotid artery system within the neck. Widely patent vertebral arteries within the neck. TEE 06/25/2021: negative for thrombus or vegetation  CTA  Head and Neck 11/16: No acute intracranial process or significant stenosis MRI Brain 11/17> scattered acute and subacute bilateral infarcts, increased in number from 11/7 ASSESSMENT & PLAN   Acute metabolic encephalopathy -Medication related -Trying to wean off medications as tolerated  Chronically on opiates and Neurontin -Reintroduce cautiously -Neurontin reintroduced -Will hold off on tramadol with ongoing seizures  Still with significant agitation -Restraints in place -Maintain Precedex -RASS goal of 0 to -1  Acute on acute and subacute bilateral punctate infarcts -Negative TEE -Vasculitic work-up ordered -Hypercoagulable panel work-up -Appreciate neuro follow-up  Seizure disorder -On Vimpat, Keppra -EEG monitoring ongoing   Acute hypoxic respiratory failure -Successfully extubated 11/18 -Continue oxygen supplementation -Possible aspiration-5 days of antibiotics-to be completed today  Hypertension -Continue lisinopril -As needed hydralazine and labetalol  Diabetes Poorly controlled -SSI -Lantus added-dose of Lantus increased  Chronic pain on chronic opiate use -Hold tramadol -Continue Neurontin  Informed about vaginal bleeding, she is postmenopausal -Ultrasound pelvis is unremarkable for any uterine lesions, cyst noted -Consulted GYN    Best practice (right click and "Reselect all SmartList Selections" daily)  Diet:  NPO Pain/Anxiety/Delirium protocol (if indicated): n/a VAP protocol (if indicated): Yes DVT prophylaxis: Subcutaneous Heparin, held because of vaginal bleeding, SCDs started GI prophylaxis: H2B Glucose control:  SSI Yes Central venous access:  N/A Arterial line:  NO Foley:  Yes, and it is still needed  Mobility:  bed rest  PT consulted:  Last date of multidisciplinary goals of care discussion [06/26/21] Code Status:  full code Disposition: ICU  The patient is critically ill with multiple organ systems failure and requires high  complexity decision making for assessment and support, frequent evaluation and titration of therapies, application of advanced monitoring technologies and extensive interpretation of multiple databases. Critical Care Time devoted to patient care services described in this note independent of APP/resident time (if applicable)  is 31 minutes.   Virl Diamond MD [facility] Personal pager: See Amion If unanswered, please page CCM On-call: #[phone]

## 2021-07-04 NOTE — Progress Notes (Signed)
Inpatient Rehab Admissions Coordinator Note:   Per PT/OT patient was screened for CIR candidacy by Garnett Nunziata Luvenia Starch, CCC-SLP. At this time, pt has not yet attempted transfers and was fatiguing quickly during tx. Pt may have potential to progress to becoming a potential CIR candidate. CIR admissions team will follow and monitor progress and participation with therapies. A consult order will be placed if pt appears to be an appropriate candidate.     Wolfgang Phoenix, MS, CCC-SLP Admissions Coordinator [phone] 07/04/21 2:29 PM

## 2021-07-04 NOTE — Progress Notes (Signed)
[facility]link called due to pt not responsive with downward gaze along with posturing like leg movements. Patient returned to baseline. Elink called because patient became unresponsive again. Neurology was paged. 2000 mg Keppra was ordered stat and given. 2mg  ativan IV ordered once, not given due to patient responsive after order placed.

## 2021-07-04 NOTE — Consult Note (Signed)
Reason for Consult:postmenopausal vaginal bleeding Referring Physician: Laurin Coder, MD  Lauren Velazquez is an 60 y.o. female. G2P2, 10 years postmenopausal admitted for multiple complaints including stroke, chest pain, shortness of breath and pain all over. She has been noted to have a moderate amount of vaginal bleeding which is resolving. She has no Gyn but has a PCP and she says her pap and mammogram are up to date.    Pertinent Gynecological History: Menses: post-menopausal Bleeding: post menopausal bleeding Contraception: none Blood transfusions: none Sexually transmitted diseases: no past history  Last pap: normal Date: per patient    Menstrual History:  Patient's last menstrual period was 08/15/2008.    Past Medical History:  Diagnosis Date   Anemia    not currently under treatment   Anxiety    Asthma    during allergy season   Diabetes mellitus without complication ([place])    Hypertension    Vertigo     Past Surgical History:  Procedure Laterality Date   CARPAL TUNNEL RELEASE Right 2015   KNEE ARTHROSCOPY Left 05/17/2018   Procedure: ARTHROSCOPY KNEE WITH LATERAL RELEASE, PARTIAL SYNOVECTOMY;  Surgeon: Hessie Knows, MD;  Location: [facility];  Service: Orthopedics;  Laterality: Left;   KNEE ARTHROSCOPY WITH LATERAL RELEASE Right 12/28/2017   Procedure: KNEE ARTHROSCOPY WITH LATERAL RELEASE;  Surgeon: Hessie Knows, MD;  Location: [facility];  Service: Orthopedics;  Laterality: Right;   TIBIAL TUBERCLERPLASTY Bilateral 06/01/2017   Procedure: TIBIAL TUBERCLE SPUR EXCISION;  Surgeon: Hessie Knows, MD;  Location: [facility];  Service: Orthopedics;  Laterality: Bilateral;    History reviewed. No pertinent family history.  Social History:  reports that she has never smoked. She has never used smokeless tobacco. She reports that she does not drink alcohol and does not use drugs.  Allergies:  Allergies  Allergen Reactions   Atorvastatin Swelling    Medications: I  have reviewed the patient's current medications.  Review of Systems  Constitutional:  Positive for activity change.  Genitourinary:  Negative for pelvic pain, vaginal bleeding and vaginal discharge.  Psychiatric/Behavioral:  Positive for agitation.    Blood pressure 122/78, pulse (!) 49, temperature 98.9 F (37.2 C), temperature source Axillary, resp. rate (!) 22, height 5\' 1"  (1.549 m), weight 105.1 kg, last menstrual period 08/15/2008, SpO2 100 %. Physical Exam Vitals and nursing note reviewed. Exam conducted with a chaperone present.  Constitutional:      Appearance: She is obese.  HENT:     Head: Normocephalic.  Cardiovascular:     Rate and Rhythm: Bradycardia present.  Pulmonary:     Effort: Pulmonary effort is normal.  Abdominal:     General: Abdomen is flat.     Comments: obese  Skin:    General: Skin is warm and dry.  Neurological:     General: No focal deficit present.     Mental Status: She is alert.  Psychiatric:     Comments: agitated    Results for orders placed or performed during the hospital encounter of 06/30/21 (from the past 48 hour(s))  Glucose, capillary     Status: Abnormal   Collection Time: 07/02/21 11:54 AM  Result Value Ref Range   Glucose-Capillary 254 (H) 70 - 99 mg/dL    Comment: Glucose reference range applies only to samples taken after fasting for at least 8 hours.  Antithrombin III     Status: None   Collection Time: 07/02/21  2:54 PM  Result Value Ref Range   AntiThromb III  Func 102 75 - 120 %    Comment: Performed at [facility], [place] [address], [place], [place] [address]  Protein C activity     Status: None   Collection Time: 07/02/21  2:54 PM  Result Value Ref Range   Protein C Activity 139 73 - 180 %    Comment: (NOTE) Performed At: [facility] [place], [place] [id number] Rush Farmer MD [id number]   Protein S activity     Status: None   Collection Time: 07/02/21  2:54 PM  Result Value Ref  Range   Protein S Activity 84 63 - 140 %    Comment: (NOTE) Protein S activity may be falsely increased (masking an abnormal, low result) in patients receiving direct Xa inhibitor (e.g., rivaroxaban, apixaban, edoxaban) or a direct thrombin inhibitor (e.g., dabigatran) anticoagulant treatment due to assay interference by these drugs. Performed At: [facility] [place], [place] [id number] Rush Farmer MD [id number]   Protein S, total     Status: None   Collection Time: 07/02/21  2:54 PM  Result Value Ref Range   Protein S Ag, Total 119 60 - 150 %    Comment: (NOTE) This test was developed and its performance characteristics determined by [facility]. It has not been cleared or approved by the Food and Drug Administration. Performed At: [facility] [place], [place] [id number] Rush Farmer MD [id number]   Lupus anticoagulant panel     Status: None   Collection Time: 07/02/21  2:54 PM  Result Value Ref Range   PTT Lupus Anticoagulant 32.8 0.0 - 51.9 sec   DRVVT 38.7 0.0 - 47.0 sec   Lupus Anticoag Interp Comment:     Comment: (NOTE) No lupus anticoagulant was detected. Performed At: [facility] [place], [place] [id number] Rush Farmer MD [id number]   Glucose, capillary     Status: Abnormal   Collection Time: 07/02/21  3:35 PM  Result Value Ref Range   Glucose-Capillary 213 (H) 70 - 99 mg/dL    Comment: Glucose reference range applies only to samples taken after fasting for at least 8 hours.  Glucose, capillary     Status: Abnormal   Collection Time: 07/02/21  7:59 PM  Result Value Ref Range   Glucose-Capillary 224 (H) 70 - 99 mg/dL    Comment: Glucose reference range applies only to samples taken after fasting for at least 8 hours.  Glucose, capillary     Status: Abnormal   Collection Time: 07/02/21 11:36 PM  Result Value Ref Range   Glucose-Capillary 239 (H) 70 - 99 mg/dL    Comment:  Glucose reference range applies only to samples taken after fasting for at least 8 hours.  CBC with Differential/Platelet     Status: Abnormal   Collection Time: 07/03/21  3:42 AM  Result Value Ref Range   WBC 11.6 (H) 4.0 - 10.5 K/uL   RBC 3.93 3.87 - 5.11 MIL/uL   Hemoglobin 9.4 (L) 12.0 - 15.0 g/dL   HCT 32.4 (L) 36.0 - 46.0 %   MCV 82.4 80.0 - 100.0 fL   MCH 23.9 (L) 26.0 - 34.0 pg   MCHC 29.0 (L) 30.0 - 36.0 g/dL   RDW 16.8 (H) 11.5 - 15.5 %   Platelets 483 (H) 150 - 400 K/uL   nRBC 0.3 (H) 0.0 - 0.2 %   Neutrophils Relative % 79 %   Neutro Abs 9.1 (H) 1.7 - 7.7  K/uL   Lymphocytes Relative 12 %   Lymphs Abs 1.4 0.7 - 4.0 K/uL   Monocytes Relative 7 %   Monocytes Absolute 0.9 0.1 - 1.0 K/uL   Eosinophils Relative 0 %   Eosinophils Absolute 0.0 0.0 - 0.5 K/uL   Basophils Relative 0 %   Basophils Absolute 0.1 0.0 - 0.1 K/uL   Immature Granulocytes 2 %   Abs Immature Granulocytes 0.19 (H) 0.00 - 0.07 K/uL    Comment: Performed at [facility] [address], [place], [place] [id number]  Basic metabolic panel     Status: Abnormal   Collection Time: 07/03/21  3:42 AM  Result Value Ref Range   Sodium 141 135 - 145 mmol/L   Potassium 3.6 3.5 - 5.1 mmol/L   Chloride 103 98 - 111 mmol/L   CO2 28 22 - 32 mmol/L   Glucose, Bld 223 (H) 70 - 99 mg/dL    Comment: Glucose reference range applies only to samples taken after fasting for at least 8 hours.   BUN 9 6 - 20 mg/dL   Creatinine, Ser 0.53 0.44 - 1.00 mg/dL   Calcium 8.8 (L) 8.9 - 10.3 mg/dL   GFR, Estimated >60 >60 mL/min    Comment: (NOTE) Calculated using the CKD-EPI Creatinine Equation (2021)    Anion gap 10 5 - 15    Comment: Performed at [facility] [address] [place], [place] [address]  Glucose, capillary     Status: Abnormal   Collection Time: 07/03/21  3:45 AM  Result Value Ref Range   Glucose-Capillary 229 (H) 70 - 99 mg/dL    Comment: Glucose reference range applies only to samples taken after  fasting for at least 8 hours.  Glucose, capillary     Status: Abnormal   Collection Time: 07/03/21  8:09 AM  Result Value Ref Range   Glucose-Capillary 240 (H) 70 - 99 mg/dL    Comment: Glucose reference range applies only to samples taken after fasting for at least 8 hours.  Glucose, capillary     Status: Abnormal   Collection Time: 07/03/21 11:59 AM  Result Value Ref Range   Glucose-Capillary 224 (H) 70 - 99 mg/dL    Comment: Glucose reference range applies only to samples taken after fasting for at least 8 hours.   Comment 1 Notify RN   Glucose, capillary     Status: Abnormal   Collection Time: 07/03/21  3:35 PM  Result Value Ref Range   Glucose-Capillary 252 (H) 70 - 99 mg/dL    Comment: Glucose reference range applies only to samples taken after fasting for at least 8 hours.  Glucose, capillary     Status: Abnormal   Collection Time: 07/03/21  8:38 PM  Result Value Ref Range   Glucose-Capillary 232 (H) 70 - 99 mg/dL    Comment: Glucose reference range applies only to samples taken after fasting for at least 8 hours.   Comment 1 Notify RN    Comment 2 Document in Chart   Glucose, capillary     Status: Abnormal   Collection Time: 07/03/21 11:53 PM  Result Value Ref Range   Glucose-Capillary 252 (H) 70 - 99 mg/dL    Comment: Glucose reference range applies only to samples taken after fasting for at least 8 hours.   Comment 1 Notify RN    Comment 2 Document in Chart   Glucose, capillary     Status: Abnormal   Collection Time: 07/04/21  5:19 AM  Result Value Ref Range   Glucose-Capillary 223 (H) 70 - 99 mg/dL    Comment: Glucose reference range applies only to samples taken after fasting for at least 8 hours.  Glucose, capillary     Status: Abnormal   Collection Time: 07/04/21  8:07 AM  Result Value Ref Range   Glucose-Capillary 210 (H) 70 - 99 mg/dL    Comment: Glucose reference range applies only to samples taken after fasting for at least 8 hours.   Narrative & Impression   CLINICAL DATA:  Sepsis.  Altered mental status.   EXAM: CT ABDOMEN AND PELVIS WITH CONTRAST   TECHNIQUE: Multidetector CT imaging of the abdomen and pelvis was performed using the standard protocol following bolus administration of intravenous contrast.   CONTRAST:  192mL OMNIPAQUE IOHEXOL 300 MG/ML  SOLN   COMPARISON:  None.   FINDINGS: Lower Chest: Mild dependent atelectasis noted in both lung bases.   Hepatobiliary: No hepatic masses identified. Mild diffuse hepatic steatosis noted. Cholelithiasis is noted, however there is no evidence of cholecystitis or biliary ductal dilatation.   Pancreas:  No mass or inflammatory changes.   Spleen: Within normal limits in size and appearance.   Adrenals/Urinary Tract: Tiny right renal cyst noted. No masses identified. Several tiny punctate renal calculi are seen, left side greater than right, however there is no evidence of ureteral calculi or hydronephrosis. Foley catheter is seen within the bladder.   Stomach/Bowel: Nasogastric tube is seen with tip in the gastric antrum. No evidence of obstruction, inflammatory process or abnormal fluid collections. Normal appendix visualized.   Vascular/Lymphatic: No pathologically enlarged lymph nodes. No acute vascular findings. Aortic atherosclerotic calcification noted.   Reproductive: Normal appearance of uterus. A simple cyst is seen in the left adnexa which measures 5.4 x 4.4 cm. No other pelvic mass, inflammatory process, or abnormal fluid collections seen.   Other:  None.   Musculoskeletal:  No suspicious bone lesions identified.   IMPRESSION: No evidence of inflammatory process or abscess.   Cholelithiasis. No radiographic evidence of cholecystitis.   Mild hepatic steatosis.   Bilateral nephrolithiasis. No evidence of ureteral calculi or hydronephrosis.   5.4 cm benign-appearing left adnexal cyst. Recommend follow-up US in 6-12 months. Note: This recommendation does not  apply to premenarchal patients and to those with increased risk (genetic, family history, elevated tumor markers or other high-risk factors) of ovarian cancer. Reference: JACR 2020 Feb; 17(2):248-254   Aortic Atherosclerosis (ICD10-I70.0).     Electronically Signed   By: Marlaine Hind M.D.   On: 06/20/2021 11:54    US PELVIS (TRANSABDOMINAL ONLY)  Result Date: 07/03/2021 CLINICAL DATA:  Postmenopausal vaginal bleeding EXAM: TRANSABDOMINAL ULTRASOUND OF PELVIS TECHNIQUE: Transabdominal ultrasound examination of the pelvis was performed including evaluation of the uterus, ovaries, adnexal regions, and pelvic cul-de-sac. COMPARISON:  CT abdomen pelvis, 06/20/2021 FINDINGS: Uterus Measurements: 9.8 x 4.7 x 5.1 cm = volume: 124 mL. No fibroids or other mass visualized. Endometrium Thickness: 6 mm.  No focal abnormality visualized. Right ovary Not visualized. Left ovary The normal ovarian parenchyma is not clearly visualized. There is a cystic lesion of the left ovary or adnexa seen on prior examination dated 06/20/2021, measuring 5.3 cm. Other findings:  No abnormal free fluid. IMPRESSION: 1. Limited transabdominal only examination. Within this limitation, no ultrasound findings to explain abnormal postmenopausal bleeding. 2. Nonvisualization of the right ovary. 3. Cystic lesion of the left ovary or adnexa seen on prior examination dated 06/20/2021, measuring 5.3 cm. Consider  GYN consult and followup US in 3-6 months, or pelvis MRI w/o and w/ contrast for improved characterization. Note: This recommendation does not apply to premenarchal patients or to those with increased risk (genetic, family history, elevated tumor markers or other high-risk factors) of ovarian cancer. Reference: Radiology 2019 Nov; 293(2):359-371. Electronically Signed   By: Jearld Lesch M.D.   On: 07/03/2021 15:49   DG Abd Portable 1V  Result Date: 07/02/2021 CLINICAL DATA:  Nasogastric tube placement. EXAM: PORTABLE ABDOMEN - 1  VIEW COMPARISON:  June 30, 2021 FINDINGS: A feeding tube is seen with its distal tip overlying the expected region of the gastric antrum. The bowel gas pattern is normal. No radio-opaque calculi or other significant radiographic abnormality are seen. IMPRESSION: Feeding tube positioning, as described above. Electronically Signed   By: Aram Candela M.D.   On: 07/02/2021 17:10    Assessment/Plan: Encounter for nasogastric (NG) tube placement - Plan: DG Abd Portable 1V, DG Abd Portable 1V, CANCELED: DG Abd 1 View, CANCELED: DG Abd 1 View  Vaginal bleeding - Plan: US PELVIS (TRANSABDOMINAL ONLY), US PELVIS (TRANSABDOMINAL ONLY) Postmenopausal vaginal bleeding. US showed simple adnexal cyst suggesting 3-6 months. Endoometrium 6 mm no discrete lesion. Will need OP f/u and I will message [facility] for f/u appt  Scheryl Darter 07/04/2021

## 2021-07-04 NOTE — Progress Notes (Signed)
Spoke with primary nurse, Weston Brass, RN. Instructed not to run any caustic medications through the midline. RN only to infuse NS and secondary abx.

## 2021-07-05 ENCOUNTER — Other Ambulatory Visit (HOSPITAL_COMMUNITY): Payer: BLUE CROSS/BLUE SHIELD

## 2021-07-05 DIAGNOSIS — R410 Disorientation, unspecified: Secondary | ICD-10-CM

## 2021-07-05 DIAGNOSIS — R339 Retention of urine, unspecified: Secondary | ICD-10-CM

## 2021-07-05 DIAGNOSIS — G9340 Encephalopathy, unspecified: Secondary | ICD-10-CM | POA: Diagnosis not present

## 2021-07-05 DIAGNOSIS — R739 Hyperglycemia, unspecified: Secondary | ICD-10-CM | POA: Diagnosis not present

## 2021-07-05 LAB — GLUCOSE, CAPILLARY
Glucose-Capillary: 189 mg/dL — ABNORMAL HIGH (ref 70–99)
Glucose-Capillary: 181 mg/dL — ABNORMAL HIGH (ref 70–99)
Glucose-Capillary: 184 mg/dL — ABNORMAL HIGH (ref 70–99)
Glucose-Capillary: 160 mg/dL — ABNORMAL HIGH (ref 70–99)
Glucose-Capillary: 194 mg/dL — ABNORMAL HIGH (ref 70–99)

## 2021-07-05 LAB — MISC LABCORP TEST (SEND OUT)
LabCorp test name: 2013305
Labcorp test code: 9985

## 2021-07-05 LAB — HOMOCYSTEINE: Homocysteine: 8.7 umol/L (ref 0.0–14.5)

## 2021-07-05 MED ORDER — INSULIN ASPART 100 UNIT/ML IJ SOLN
2.0000 [IU] | INTRAMUSCULAR | Status: DC
  Administered 2021-07-05 – 2021-07-06 (×5): 2 [IU] via SUBCUTANEOUS

## 2021-07-05 MED ORDER — FENTANYL CITRATE PF 50 MCG/ML IJ SOSY
12.5000 ug | PREFILLED_SYRINGE | Freq: Four times a day (QID) | INTRAMUSCULAR | Status: DC | PRN
  Administered 2021-07-05: 12.5 ug via INTRAVENOUS
  Filled 2021-07-05: qty 1

## 2021-07-05 MED ORDER — GABAPENTIN 250 MG/5ML PO SOLN
600.0000 mg | Freq: Three times a day (TID) | ORAL | Status: DC
  Filled 2021-07-05 (×2): qty 12

## 2021-07-05 MED ORDER — OXYCODONE HCL 5 MG PO TABS
5.0000 mg | ORAL_TABLET | Freq: Four times a day (QID) | ORAL | Status: DC | PRN
  Administered 2021-07-05 – 2021-07-06 (×4): 5 mg via ORAL
  Filled 2021-07-05 (×4): qty 1

## 2021-07-05 MED ORDER — GABAPENTIN 600 MG PO TABS
600.0000 mg | ORAL_TABLET | Freq: Three times a day (TID) | ORAL | Status: DC
  Administered 2021-07-05 – 2021-07-20 (×45): 600 mg via ORAL
  Filled 2021-07-05 (×47): qty 1

## 2021-07-05 MED ORDER — ASPIRIN 81 MG PO CHEW
81.0000 mg | CHEWABLE_TABLET | Freq: Every day | ORAL | Status: DC
  Administered 2021-07-06 – 2021-07-07 (×2): 81 mg via ORAL
  Filled 2021-07-05 (×2): qty 1

## 2021-07-05 MED ORDER — BETHANECHOL CHLORIDE 10 MG PO TABS
10.0000 mg | ORAL_TABLET | Freq: Three times a day (TID) | ORAL | Status: DC
  Administered 2021-07-05 – 2021-07-19 (×42): 10 mg via ORAL
  Filled 2021-07-05 (×42): qty 1

## 2021-07-05 MED ORDER — QUETIAPINE FUMARATE 50 MG PO TABS
50.0000 mg | ORAL_TABLET | Freq: Two times a day (BID) | ORAL | Status: DC
  Administered 2021-07-06 – 2021-07-20 (×30): 50 mg via ORAL
  Filled 2021-07-05 (×30): qty 1

## 2021-07-05 MED ORDER — INSULIN GLARGINE-YFGN 100 UNIT/ML ~~LOC~~ SOLN
36.0000 [IU] | Freq: Two times a day (BID) | SUBCUTANEOUS | Status: DC
Start: 2021-07-05 — End: 2021-07-08
  Administered 2021-07-05 – 2021-07-07 (×6): 36 [IU] via SUBCUTANEOUS
  Filled 2021-07-05 (×9): qty 0.36

## 2021-07-05 MED ORDER — FENTANYL CITRATE PF 50 MCG/ML IJ SOSY
12.5000 ug | PREFILLED_SYRINGE | INTRAMUSCULAR | Status: DC | PRN
  Administered 2021-07-05 – 2021-07-07 (×6): 12.5 ug via INTRAVENOUS
  Filled 2021-07-05 (×5): qty 1

## 2021-07-05 MED ORDER — QUETIAPINE FUMARATE 25 MG PO TABS
50.0000 mg | ORAL_TABLET | Freq: Two times a day (BID) | ORAL | Status: DC
Start: 2021-07-05 — End: 2021-07-05
  Administered 2021-07-05: 50 mg
  Filled 2021-07-05: qty 2

## 2021-07-05 MED ORDER — EZETIMIBE 10 MG PO TABS
10.0000 mg | ORAL_TABLET | Freq: Every day | ORAL | Status: DC
  Administered 2021-07-06 – 2021-07-20 (×15): 10 mg via ORAL
  Filled 2021-07-05 (×15): qty 1

## 2021-07-05 MED ORDER — AMLODIPINE BESYLATE 10 MG PO TABS
10.0000 mg | ORAL_TABLET | Freq: Every day | ORAL | Status: DC
  Administered 2021-07-06 – 2021-07-19 (×14): 10 mg via ORAL
  Filled 2021-07-05 (×14): qty 1

## 2021-07-05 MED ORDER — BETHANECHOL CHLORIDE 10 MG PO TABS
10.0000 mg | ORAL_TABLET | Freq: Three times a day (TID) | ORAL | Status: DC
  Administered 2021-07-05: 10 mg
  Filled 2021-07-05: qty 1

## 2021-07-05 MED ORDER — LISINOPRIL 20 MG PO TABS
40.0000 mg | ORAL_TABLET | Freq: Every day | ORAL | Status: DC
  Administered 2021-07-06 – 2021-07-19 (×14): 40 mg via ORAL
  Filled 2021-07-05 (×14): qty 2

## 2021-07-05 NOTE — Progress Notes (Signed)
vLTM discontinued.  [facility] notified.  No skin breakdown noted at all lead sites.

## 2021-07-05 NOTE — Progress Notes (Signed)
Patient is doing much better and ready to progress.  Her precedex has been off all morning and restraints discontinued.  EEG is discontinued.  On Dys 3 diet and cortrak has been removed. She has been much more cooperative and pleasant.   Will transfer to floor unit once bed is available. Lauren Velazquez C 1:14 PM  

## 2021-07-05 NOTE — Progress Notes (Signed)
[facility]  NAME:  Analiese Krupka, MRN:  [id number], DOB:  [birth year], LOS: 5 ADMISSION DATE:  06/30/2021, INITIAL CONSULTATION DATE:  06/19/2021 REFERRING MD:  Dr. Elesa Massed Chrisitine, CHIEF COMPLAINT:  Altered Mental Status   Brief Patient Description  60 year old female presenting to [facility] ED from home via EMS for multiple complaints including stroke, chest pain, shortness of breath and pain all over. (Per ED documentation)   ED course: Per ED documentation upon arrival patient was agitated and extremely altered, exhibiting bizarre behavior.  She was screaming and yelling sticking her finger down her throat and attempt to make her self vomit.  While sitting in a recliner began attempting to flip it over eventually requiring physical restraint for her own safety.  ED staff attempted to sedate her as she was kicking and swinging arms at staff and husband.  Her main complaint to the ED provider was chest pain, she stated she was worried that she might have had a heart attack as well as feeling very dehydrated.  She also insisted on being given her chronic pain medication, however she also told staff she will take all her pills and overdose. Patient was placed under IVC.  Per ED documentation, husband denied recreational drug use or any new medications (unclear if this comment was made to EDP or EMS).  While in the ED patient became febrile and sepsis protocol was initiated.  No definitive source of infection noted in CXR/UA, this in combination with severely altered mental status raise concern for meningitis.  Due to patient's increasingly agitated state refractory to benzodiazepines the decision was made by the EDP to mechanically intubate for airway protection in order to provide further sedation so that an LP could be performed safely  Medications given: 50 mg Benadryl, 5 mg Haldol, 2 mg Ativan x2, cefepime/vancomycin/acyclovir, 10 mg of Decadron, total 2.6 L IV fluid bolus,  succinylcholine for RSI, 650 mg acetaminophen for fever Initial Vitals: Severely febrile at 103.9, tachypneic 32, tachycardic 134, hypertensive urgency 232/97 & 95% on room air EKG Interpretation: Date: 06/19/2021, EKG Time: 23:23, Rate: 121, Rhythm: Sinus tachycardia, QRS Axis: RAD, Intervals: Prolonged QTC, ST/T Wave abnormalities: None, Narrative Interpretation: Sinus tachycardia with RAD Chemistry: Na+: 134, K+: 3.9, BUN/Cr.:  9/0.76, Serum CO2/ AG: 28/9 Hematology: WBC: 14.4, Hgb: 12.2,  Normal troponin: 8> 15,  Lactic/ PCT: 4.5/pending, COVID-19 & Influenza A/B: negative ABG: 7.46/32/177/22.8 CXR 06/20/21: mild central vascular congestion CT head wo contrast 06/20/21: no acute intracranial abnormality  On 11/16 Pt had been transferred to the floor and had been intermittently drowsy throughout the day, then had an episode of complete unresponsiveness with foaming at the mouth and posturing with acute hypotension.  She was intubated emergently and transferred to the ICU.   Patient was evaluated by neurology and stat CT and CTA head and neck were negative for acute findings, she was loaded with 4g Keppra and transferred to [facility] hospital for continuous EEG. 11/19: 2 witnessed seizures, back on EEG monitoring  Pertinent  Medical History  T2DM HTN Vertigo HLD Anxiety Chronic Back pain COVID-19   Significant Hospital Events: Including procedures, antibiotic start and stop dates in addition to other pertinent events   06/20/21- Admit to ICU  with suspected meningitis s/p mechanical intubation for airway protection in the setting of increased sedation needs and agitation. S/p LP @2 :37 AM. ID consult. Patient started empirically on vancomycin, ceftriaxone, ampicillin and acyclovir.  ID consult. 06/21/21- MRI w/out contrast shows acute infarcts (embolic vs. Hypotensive).  Neuro, and psych consulted.  TTE showed normal LVEF, grade I diastolic dysfunction, no sig valvular abnormalities and no intracardiac  clot.  rEEG showed moderate diffuse slowing with subtle superimposed right focal slowing without epileptiform abnormalities 06/22/21-persistent agitation off of sedation.  Started Depakene for mood stabilization and cyproheptadine for potential serotonergic symptoms. 06/23/21: Remains on the vent and sedated. 06/24/21: Remains on the vent. Neuro eval recs central embolic suspected.  Start ASA 81mg  daily + plavix 75mg  daily x21 days f/b ASA 81mg  daily after that.  Consider TEE 06/25/21: Remains on the vent and sedation.  Status post TEE negative for thrombus or vegetation. 06/26/21: S/p extubation now IVC with sitter at the bedside 06/27/21: Stable on [place] @2L  with fluctuating mental status. Sitter at bedside 06/28/21: Restart home dose of oxycodone for concern of possible withdrawal. EEG per neuro r/o seizure activity. Patient stable, able to be transferred to step down care.  06/29/21: Transferred to Med/surg 06/30/21: Rapid response called for unresponsiveness and hypotension.  Upon arrival to ICU concern for seizure activity.  Intubated for airway protection.  Transferred to [facility] for continuous EEG, PCCM admit 11/17 no further seizures. More scattered punctate infarcts on MRI compared to 11/7  11/18 extubated  11/19 had 2 witnessed seizures, loaded with Keppra, on EEG monitoring  Cultures:  11/6>> CSF- mild pleocytosis. Culture- no growth at 4 days. VDRL- negative.  11/6>> blood cultures- negative (no growth @4d ) 11/6>> MRSA PCR - negative  11/6>> HSV1/2- negative  11/6>> Crypotococcal Ag - negative   11/16 resp culture >   Antimicrobials:  11/6 Vancomycin >> 11/10 11/6 Ampicillin >>11/13 11/6 Ceftriaxone >>11/14 11/6 Acyclovir >>11/9 11/6 Cefepime >> x 1 dose 11/16 unasyn -11/20  Interim History / Subjective:  Verbally aggressive, wants to go home. She is worried about her husband who has dementia.    OBJECTIVE   Blood pressure 139/64, pulse (!) 44, temperature 98.4 F (36.9 C),  temperature source Axillary, resp. rate (!) 22, height 5\' 1"  (1.549 m), weight 111.3 kg, last menstrual period 08/15/2008, SpO2 100 %.        Intake/Output Summary (Last 24 hours) at 07/05/2021 [id number] Last data filed at 07/05/2021 0600 Gross per 24 hour  Intake 3035.67 ml  Output 810 ml  Net 2225.67 ml     General: ill appearing woman lying in bed in NAD, on cEEG monitoring neuro: Awake, moving extremities. Oriented to place, year, month, but not day, and situation.  HEENT: EEG leads, oral mucosa moist Cardiovascular: S1S2, RRR Lungs: breathing comfortably on 5L [place]. No accessory muscle use. Abdomen: obese, soft, NT Musculoskeletal: no clubbing or cyanosis Skin: warm, dry, no rashes Psych: poor insight, changes her attitude with a physician in the room but within seconds of my leaving said verbally aggressive/ threatening comment to the nurse.   Labs/imaging that I havepersonally reviewed  (right click and "Reselect all SmartList Selections" daily)  Transvaginal US> unable to see R ovary, left ovary cystic lesion 5.3cm BG 180-250s  ASSESSMENT & PLAN   Acute metabolic encephalopathy; unsure how far off baseline she is. Will be helpful to have family at bedside. There is concern that this has been medication related. Agitation is a prominent feature for her. -Trial out of restraints today, but if she is physically aggressive these may need to be replaced.  -Will help to have family at bedside.  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 No cardiac source of embolism on TEE. No DVTs BLE. -Appreciate neurology's input. cEEG per neurology. -con't AEDs -has transcranial [place] with bubbles ordered per Stroke team. -Negative TEE -Vasculitic work-up ordered> ANCA pending.  Neg ANA. -Hypercoagulable panel work-up pending. Protein C&S activity WNL. -Appreciate neuro follow-up  Seizure episode -Con't keppra and vimpat per neurology's recommendations -EEG monitoring ongoing  Acute on chronic hypoxic respiratory failure, extubated 11/18.  OSA Chronic restrictive lung disease post-covid> suspect she is at risk for obesity-related restriction as well. Follows with [facility]. Reports using 3.5 L nocturnal O2 at home. -wean O2 as able to maintain SpO2 >88%. -Possible aspiration-5 days of antibiotics-to be completed today  Hypertension -Con't lisinopril, amlodipine -con't PRN hydralazine and labetalol  Diabetes, uncontrolled hyperglycemia. A1c 9.1 -SSI PRN -adding TF coverage, 2 units Q4h with hold parameters -Lantus increased to 36 units BID -goal BG 140-180  Vaginal bleeding,  postmenopausal. No uterine lesions on [place]. L ovarian cyst on IS -Appreciate gynecology's input> they have recommended outpatient follow up.  Dysphagia -SLP consult -con't TF  Deconditioning -PT, OT  MR- mild to moderate -needs OP follow up  Urinary retention -add bethanechol -trial of removing foley tomorrow  Best practice (right click and "Reselect all SmartList Selections" daily)  Diet:  NPO Pain/Anxiety/Delirium protocol (if indicated): n/a VAP protocol (if indicated): Yes DVT prophylaxis: Subcutaneous Heparin, held because of vaginal bleeding, SCDs started GI prophylaxis: N/A Glucose control:  SSI Yes and Basal insulin Yes Central venous access:  N/A Arterial line:  NO Foley:  Yes, and it is still needed- trial out tomorrow  Mobility:  OOB  PT consulted: yes Last date of multidisciplinary goals of care discussion [06/26/21] Code Status:  full code Disposition: ICU  This patient is critically ill with multiple organ system failure which requires frequent high complexity decision making, assessment, support, evaluation, and titration of therapies. This was  completed through the application of advanced monitoring technologies and extensive interpretation of multiple databases. During this encounter critical care time was devoted to patient care services described in this note for 50 minutes.  Julian Hy, DO 07/05/21 8:46 AM [facility]

## 2021-07-05 NOTE — Care Plan (Signed)
Hospitalists Transfer Note   61 y.o. F with MO, DM, HTN, chronic opiates, asthma on noct O2 at home who presented to [facility] with erratic behavior (see H&P 11/5).  Initial ddx viral meningitis, serotonin syndrome opiate withdrawal.    Meningitis ruled out.  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  Off cEEG today, seizures controlled.  Recommending CIR but not in network for [facility] CIR.    There was concern for aspiration pneumonia and she was treated with 5 days Unasyn.    Some vaginal bleeding was noted, unclear significance of this.  No obvious source for her embolic strokes vs hypotensive watershed type injury.  TRH to assume care tomorrow, somewhat agitated still.

## 2021-07-05 NOTE — Progress Notes (Signed)
2100 At the beginning of the shift during initial assessment, uncooperative while nurse tech (NT) attempted to check CBG. Began yelling multiple racial slurs at NT while she was in the room. Attempted to de-escalate situation by assisting NT with CBG and calmly explaining that yelling racial slurs is unacceptable. Continued to yell slurs at NT.   0030 Intermittently yelling, "Help," throughout the shift. Entered the room to assess multiple times, vital signs have remained stable and in no physical distress. Continues to be uncooperative and argumentative with each contact. Attempted to check CBG with assistance of female nurse and stated, "I don't like men. F** men." Re-attempted CBG with female nurse, verbally abusive to her as well stating, "Be quiet, I don't want to listen to what you say, I'm not talking to you," after asking the female nurse a question. Complains of pain in back and legs. Notified eLink of behavior and pain, order for 12.5 mcg Fentanyl obtained (see MAR).

## 2021-07-05 NOTE — Progress Notes (Signed)
eLink Physician-Brief Progress Note Patient Name: Lauren Velazquez DOB: 1961-06-25 MRN: [id number]   Date of Service  07/05/2021  HPI/Events of Note  Patient c/o leg and back pain. Requesting home Oxycodone, however, is NPO at this time.   eICU Interventions  Plan: Fentanyl 12.5 mcg IV Q 6 hours PRN pain.      Intervention Category Major Interventions: Other:  Lenell Antu 07/05/2021, 12:30 AM

## 2021-07-05 NOTE — Progress Notes (Signed)
Occupational Therapy Treatment Patient Details Name: Lauren Velazquez MRN: [id number] DOB: 1960-11-24 Today's Date: 07/05/2021   History of present illness 60 yo female presenting to [facility] ED on 11/6 with AMS with agitation; suspect viral meningitis. Intubated 11/6-11/12 for airway protection after sedation. Transfered to [facility] on 11/15. EEG negative for ongoing seizures. Rapid response called for unresponsiveness and hypotension on 11/16. 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 Intubated 11/16-11/18. PMH including HTN, T2DM, vertigo, anxiety, chronic back pain, and HLD.   OT comments  Pt demonstrating increased arousal and engagement. Continue to present with decreased cognition, balance, strength, and safety. Pt performing stand pivot to [facility] with Mod A +2 and Max A for peri care after BM. Pt with fluctuating emotions and temperament. Continue to recommend dc to CIR for intensive OT. Will continue to follow acutely as admitted.    Recommendations for follow up therapy are one component of a multi-disciplinary discharge planning process, led by the attending physician.  Recommendations may be updated based on patient status, additional functional criteria and insurance authorization.    Follow Up Recommendations  Acute inpatient rehab (3hours/day)    Assistance Recommended at Discharge Frequent or constant Supervision/Assistance  Equipment Recommendations  Other (comment) (defer to next venue of care)    Recommendations for Other Services Rehab consult;PT consult;Speech consult    Precautions / Restrictions Precautions Precautions: Fall Precaution Comments: can become irritable/agitated Restrictions Weight Bearing Restrictions: No       Mobility Bed Mobility Overal bed mobility: Needs Assistance Bed Mobility: Rolling;Supine to Sit;Sit to Supine Rolling: Mod assist   Supine to sit: Max assist;+2 for  physical assistance Sit to supine: +2 for physical assistance;Mod assist   General bed mobility comments: mod-max +2 for rolling bilat for truncal and LE translation, for supine<>sit for trunk elevation off of bed and LE lifting back into bed. Pt also requires boost assist for positioning with use of trendelenburg bed function and bed pads.    Transfers Overall transfer level: Needs assistance Equipment used: 2 person hand held assist Transfers: Sit to/from Stand;Bed to chair/wheelchair/BSC Sit to Stand: Mod assist;+2 physical assistance   Step pivot transfers: Mod assist;+2 physical assistance;+2 safety/equipment;From elevated surface       General transfer comment: mod +2 for transfer to/from [facility] towards pt's R for steadying, closely guarding LEs, lowering onto BSC/EOB respectively. STS x4, from EOB x2 and BSC x2.     Balance Overall balance assessment: Needs assistance Sitting-balance support: No upper extremity supported;Feet supported Sitting balance-Leahy Scale: Fair Sitting balance - Comments: Able to maintain static sitting. Very close [employer] A for safety and cognition   Standing balance support: Bilateral upper extremity supported;During functional activity Standing balance-Leahy Scale: Poor Standing balance comment: reliant on external support                           ADL either performed or assessed with clinical judgement   ADL Overall ADL's : Needs assistance/impaired Eating/Feeding: NPO   Grooming: Brushing hair;Supervision/safety;Set up;Bed level Grooming Details (indicate cue type and reason): Pt fatigues quickly, frequently dropping brush, however able to complete without physical assist. Requires verbal cues for throughness             Lower Body Dressing: Maximal assistance;Bed level Lower Body Dressing Details (indicate cue type and reason): to don/doff socks Toilet Transfer: Moderate assistance;+2 for physical assistance;+2 for  safety/equipment;Stand-pivot;BSC/3in1  Toileting - Clothing Manipulation Details (indicate cue type and reason): Max A for peri care after BM     Functional mobility during ADLs: Moderate assistance;+2 for physical assistance (stand pivot) General ADL Comments: Pt performing stand pivot to/from the bed and BSC. Demonstrating decreased strength, balance, and cognition imapcting her safe performance of ADLs    Extremity/Trunk Assessment Upper Extremity Assessment Upper Extremity Assessment: Overall WFL for tasks assessed   Lower Extremity Assessment Lower Extremity Assessment: Defer to PT evaluation RLE Deficits / Details: able to perform LAQ - grossly 3+/5 RLE Coordination: decreased gross motor LLE Deficits / Details: able to perform LAQ -grossly 3+/5 LLE Coordination: decreased gross motor        Vision       Perception     Praxis      Cognition Arousal/Alertness: Awake/alert Behavior During Therapy: Restless;Impulsive Overall Cognitive Status: Impaired/Different from baseline Area of Impairment: Attention;Memory;Following commands;Safety/judgement;Problem solving;Awareness                   Current Attention Level: Sustained Memory: Decreased short-term memory Following Commands: Follows one step commands with increased time Safety/Judgement: Decreased awareness of deficits;Decreased awareness of safety Awareness: Intellectual Problem Solving: Slow processing;Difficulty sequencing;Requires tactile cues;Requires verbal cues General Comments: Pt perseverating on certain topics such as "being good girl" so the RN doesnt need to use the restraints. Can become irritated quickly. Recalling that she was "nasty earlier" and she feels bad about it. Pt continues to report seeing "roaches in the ceiling". Pt asking if certain information is true such as her car being in the parking lot becuase she dreamed it was.          Exercises     Shoulder Instructions        General Comments SPO2 drop to 80% on 5LO2 with inconsistent pleth, cues for breathing technique for recovery to 90s%    Pertinent Vitals/ Pain       Pain Assessment: Faces Faces Pain Scale: Hurts a little bit Pain Location: buttocks, during to/from [facility] Pain Descriptors / Indicators: Aching;Discomfort;Grimacing Pain Intervention(s): Monitored during session;Limited activity within patient's tolerance;Repositioned  Home Living                                          Prior Functioning/Environment              Frequency  Min 2X/week        Progress Toward Goals  OT Goals(current goals can now be found in the care plan section)  Progress towards OT goals: Progressing toward goals  Acute Rehab OT Goals Patient Stated Goal: unstated OT Goal Formulation: Patient unable to participate in goal setting Time For Goal Achievement: 07/16/21 Potential to Achieve Goals: Fair ADL Goals Pt Will Perform Grooming: with min assist;sitting Pt Will Perform Upper Body Dressing: with mod assist;sitting Pt Will Perform Lower Body Dressing: with min assist;with adaptive equipment;sit to/from stand Pt Will Transfer to Toilet: with mod assist;stand pivot transfer;bedside commode Additional ADL Goal #1: Pt will follow one step commands 75% of time during ADLs Additional ADL Goal #2: Pt will perform bed mobility with Mod A in preparation for ADLs  Plan Discharge plan remains appropriate    Co-evaluation    PT/OT/SLP Co-Evaluation/Treatment: Yes Reason for Co-Treatment: Complexity of the patient's impairments (multi-system involvement);Necessary to address cognition/behavior during functional activity;For patient/therapist safety;To address functional/ADL transfers PT goals addressed  during session: Mobility/safety with mobility;Balance OT goals addressed during session: ADL's and self-care      AM-PAC OT "6 Clicks" Daily Activity     Outcome Measure   Help from another  person eating meals?: Total Help from another person taking care of personal grooming?: A Lot Help from another person toileting, which includes using toliet, bedpan, or urinal?: A Lot Help from another person bathing (including washing, rinsing, drying)?: A Lot Help from another person to put on and taking off regular upper body clothing?: A Lot Help from another person to put on and taking off regular lower body clothing?: Total 6 Click Score: 10    End of Session Equipment Utilized During Treatment: Oxygen  OT Visit Diagnosis: Unsteadiness on feet (R26.81);Muscle weakness (generalized) (M62.81)   Activity Tolerance Patient tolerated treatment well   Patient Left in bed;with call bell/phone within reach;with bed alarm set;with nursing/sitter in room   Nurse Communication Mobility status        Time: 1103-1594 OT Time Calculation (min): 30 min  Charges: OT General Charges $OT Visit: 1 Visit OT Treatments $Self Care/Home Management : 8-22 mins  Shareeka Yim MSOT, OTR/L Acute Rehab Pager: [phone] Office: [phone]  Theodoro Grist Denisa Enterline 07/05/2021, 1:35 PM

## 2021-07-05 NOTE — Progress Notes (Signed)
Inpatient Rehab Admissions Coordinator:   Rescreened by Estill Dooms, PT, DPT.  Note pt's insurance is out of network with [employer].  Recommend TOC consult for referral to other area AIRs.    Estill Dooms, PT, DPT Admissions Coordinator [phone] 07/05/21  1:24 PM

## 2021-07-05 NOTE — Progress Notes (Signed)
Physical Therapy Treatment Patient Details Name: Lauren Velazquez MRN: [id number] DOB: 03/20/1961 Today's Date: 07/05/2021   History of Present Illness 60 yo female presenting to [facility] ED on 11/6 with AMS with agitation; suspect viral meningitis. Intubated 11/6-11/12 for airway protection after sedation. Transfered to [facility] on 11/15. EEG negative for ongoing seizures. Rapid response called for unresponsiveness and hypotension on 11/16. 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 Intubated 11/16-11/18. PMH including HTN, T2DM, vertigo, anxiety, chronic back pain, and HLD.    PT Comments    Per chart review, pt agitated and restless this am with RN staff. Pt reports she was "nasty" earlier, but is going to be sweet for the rest of the day. Pt requiring mod-max +2 for multiple stands and pivot to/from Bsc today, pt with significant LE weakness and is high fall risk at this time. PT continuing to recommend IPR, will continue to follow.    Spo2 80-95% on 5LO2, cues for breathing technique and rest with recovery to 90s%   Recommendations for follow up therapy are one component of a multi-disciplinary discharge planning process, led by the attending physician.  Recommendations may be updated based on patient status, additional functional criteria and insurance authorization.  Follow Up Recommendations  Acute inpatient rehab (3hours/day)     Assistance Recommended at Discharge Frequent or constant Supervision/Assistance  Equipment Recommendations  Other (comment) (TBD)    Recommendations for Other Services Rehab consult     Precautions / Restrictions Precautions Precautions: Fall Precaution Comments: can become irritable/agitated Restrictions Weight Bearing Restrictions: No     Mobility  Bed Mobility Overal bed mobility: Needs Assistance Bed Mobility: Rolling;Supine to Sit;Sit to Supine Rolling: Mod assist    Supine to sit: Max assist;+2 for physical assistance Sit to supine: +2 for physical assistance;Mod assist   General bed mobility comments: mod-max +2 for rolling bilat for truncal and LE translation, for supine<>sit for trunk elevation off of bed and LE lifting back into bed. Pt also requires boost assist for positioning with use of trendelenburg bed function and bed pads.    Transfers Overall transfer level: Needs assistance Equipment used: 2 person hand held assist Transfers: Sit to/from Stand;Bed to chair/wheelchair/BSC Sit to Stand: Mod assist;+2 physical assistance     Step pivot transfers: Mod assist;+2 physical assistance;+2 safety/equipment;From elevated surface     General transfer comment: mod +2 for transfer to/from [facility] towards pt's R for steadying, closely guarding LEs, lowering onto BSC/EOB respectively. STS x4, from EOB x2 and BSC x2.    Ambulation/Gait               General Gait Details: nt   Social research officer, government Rankin (Stroke Patients Only) Modified Rankin (Stroke Patients Only) Pre-Morbid Rankin Score: No symptoms Modified Rankin: Moderately severe disability     Balance Overall balance assessment: Needs assistance Sitting-balance support: No upper extremity supported;Feet supported Sitting balance-Leahy Scale: Fair Sitting balance - Comments: Able to maintain static sitting. Very close [employer] A for safety and cognition   Standing balance support: Bilateral upper extremity supported;During functional activity Standing balance-Leahy Scale: Poor Standing balance comment: reliant on external support                            Cognition Arousal/Alertness: Lethargic Behavior During Therapy: Restless;Impulsive Overall Cognitive  Status: Impaired/Different from baseline Area of Impairment: Attention;Memory;Following commands;Safety/judgement;Problem solving                   Current  Attention Level: Sustained Memory: Decreased short-term memory Following Commands: Follows one step commands with increased time Safety/Judgement: Decreased awareness of deficits;Decreased awareness of safety   Problem Solving: Slow processing;Difficulty sequencing;Requires tactile cues;Requires verbal cues General Comments: Pt initially lacking insight into deficits, requesting PT and OT to "back up" when transferring stating she can do it herself. After return to bed, pt states she is aware she is weak and cannot go home today. Pt can be tangential in conversation, gets off-topic requiring cues to return to task at hand. Pt states she was "nasty, not wild" earlier. Functional Status Assessment: Patient has had a recent decline in their functional status and demonstrates the ability to make significant improvements in function in a reasonable and predictable amount of time.      Exercises      General Comments General comments (skin integrity, edema, etc.): SPO2 drop to 80% on 5LO2 with inconsistent pleth, cues for breathing technique for recovery to 90s%      Pertinent Vitals/Pain Pain Assessment: Faces Faces Pain Scale: Hurts a little bit Pain Location: buttocks, during to/from [facility] Pain Descriptors / Indicators: Aching;Discomfort;Grimacing Pain Intervention(s): Limited activity within patient's tolerance;Monitored during session;Repositioned    Home Living                          Prior Function            PT Goals (current goals can now be found in the care plan section) Acute Rehab PT Goals Patient Stated Goal: did not state PT Goal Formulation: With patient Time For Goal Achievement: 07/16/21 Potential to Achieve Goals: Fair Progress towards PT goals: Progressing toward goals    Frequency    Min 4X/week      PT Plan Current plan remains appropriate    Co-evaluation PT/OT/SLP Co-Evaluation/Treatment: Yes Reason for Co-Treatment: For patient/therapist  safety;To address functional/ADL transfers;Complexity of the patient's impairments (multi-system involvement);Necessary to address cognition/behavior during functional activity PT goals addressed during session: Mobility/safety with mobility;Balance        AM-PAC PT "6 Clicks" Mobility   Outcome Measure  Help needed turning from your back to your side while in a flat bed without using bedrails?: A Lot Help needed moving from lying on your back to sitting on the side of a flat bed without using bedrails?: A Lot Help needed moving to and from a bed to a chair (including a wheelchair)?: Total Help needed standing up from a chair using your arms (e.g., wheelchair or bedside chair)?: Total Help needed to walk in hospital room?: Total Help needed climbing 3-5 steps with a railing? : Total 6 Click Score: 8    End of Session Equipment Utilized During Treatment: Oxygen Activity Tolerance: Patient tolerated treatment well;Patient limited by fatigue Patient left: in bed;with call bell/phone within reach;with nursing/sitter in room;with bed alarm set;with SCD's reapplied Nurse Communication: Mobility status PT Visit Diagnosis: Unsteadiness on feet (R26.81);Muscle weakness (generalized) (M62.81);Other abnormalities of gait and mobility (R26.89);Other symptoms and signs involving the nervous system (R29.898)     Time: 3818-2993 PT Time Calculation (min) (ACUTE ONLY): 30 min  Charges:  $Therapeutic Activity: 8-22 mins                     Avriel Kandel S, PT DPT Acute Rehabilitation  Services Pager [phone]  Office [phone]   Truddie Coco 07/05/2021, 11:34 AM

## 2021-07-05 NOTE — Progress Notes (Signed)
Attempted to call daughter Elon Jester to provide an update- no answer, left a message.  Steffanie Dunn, DO 07/05/21 6:17 PM [facility]

## 2021-07-05 NOTE — Progress Notes (Signed)
SLP Treatment Session Patient Details  Name: Rubina Basinski MRN: [id number] Date of Birth: 01/17/1961  Today's Date: 07/05/2021 Time: SLP Start Time (ACUTE ONLY): 0841 SLP Stop Time (ACUTE ONLY): 0915 SLP Time Calculation (min) (ACUTE ONLY): 34 min  Past Medical History:  Past Medical History:  Diagnosis Date   Anemia    not currently under treatment   Anxiety    Asthma    during allergy season   Diabetes mellitus without complication (HCC)    Hypertension    Vertigo    Past Surgical History:  Past Surgical History:  Procedure Laterality Date   CARPAL TUNNEL RELEASE Right 2015   KNEE ARTHROSCOPY Left 05/17/2018   Procedure: ARTHROSCOPY KNEE WITH LATERAL RELEASE, PARTIAL SYNOVECTOMY;  Surgeon: Kennedy Bucker, MD;  Location: [facility] ORS;  Service: Orthopedics;  Laterality: Left;   KNEE ARTHROSCOPY WITH LATERAL RELEASE Right 12/28/2017   Procedure: KNEE ARTHROSCOPY WITH LATERAL RELEASE;  Surgeon: Kennedy Bucker, MD;  Location: [facility] ORS;  Service: Orthopedics;  Laterality: Right;   TIBIAL TUBERCLERPLASTY Bilateral 06/01/2017   Procedure: TIBIAL TUBERCLE SPUR EXCISION;  Surgeon: Kennedy Bucker, MD;  Location: [facility] ORS;  Service: Orthopedics;  Laterality: Bilateral;   HPI:  61 yo female presenting to [facility] ED on 11/6 with AMS with agitation; suspect viral meningitis. Intubated 11/6-11/12 and again 11/16-11/18 for airway protection after sedation. Transfered to [facility] on 11/15. EEG negative for ongoing seizures. Rapid response called for unresponsiveness and hypotension on 11/16. 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 Intubated 11/16-11/18. PMH including HTN, T2DM, vertigo, anxiety, chronic back pain, and HLD.    Assessment / Plan / Recommendation  Clinical Impression  Pt was seen for therapy targeting swallowing goals [facility]. Pt awake however expressing anger with current situation however  cooperative this morning. SLP repositioned pt upright however pt scooted down and leaned back against SLP request. Oral mechanism exam revealed mild generalized oral weakness and poor condition of dentition but was otherwise unremarkable. SLP trialed thin and nectar thick liquid from cup and straw, puree, and regular solids. Pt exhibited no oral phase impairements across consistencies. With large/consecutive sips of thin liquids, pt exhibited immediate cough and with large sips of nectar, immediate and delayed throat clear. Pt exhibited impulsivity with cup sips and required one to one cues to successfully incorporate small sip strategy. Pt exhibited no overt s/s of penetration or aspiration with small sips of thin or nectar thick liquid or solid consistencies. Recommend Dys3/nectar thick liquid d/t impulsivity and reduced pharyngeal symptoms with thickened liquid, administering medications crushed in puree. Pt requires full supervision to incorporate small sips strategy. SLP will continue to follow pt for mangement of diet tolerance, compensatory strategy training, and upgraded PO trials as appropriate. SLP Visit Diagnosis: Dysphagia, unspecified (R13.10)    Aspiration Risk  Mild aspiration risk    Diet Recommendation Dysphagia 3 (Mech soft);Nectar-thick liquid   Liquid Administration via: Straw;Cup Medication Administration: Crushed with puree Supervision: Patient able to self feed;Full supervision/cueing for compensatory strategies Compensations: Small sips/bites;Slow rate (head upright even if partially reclined) Postural Changes: Seated upright at 90 degrees    Other  Recommendations Oral Care Recommendations: Oral care BID Other Recommendations: Order thickener from pharmacy    Recommendations for follow up therapy are one component of a multi-disciplinary discharge planning process, led by the attending physician.  Recommendations may be updated based on patient status, additional functional  criteria and insurance  authorization.  Follow up Recommendations Follow physician's recommendations for discharge plan and follow up therapies      Assistance Recommended at Discharge Intermittent Supervision/Assistance  Functional Status Assessment Patient has had a recent decline in their functional status and demonstrates the ability to make significant improvements in function in a reasonable and predictable amount of time.  Frequency and Duration min 2x/week  2 weeks       Prognosis Prognosis for Safe Diet Advancement: Good Barriers to Reach Goals: Behavior      Swallow Study   General Date of Onset: 06/20/21 HPI: 60 yo female presenting to [facility] ED on 11/6 with AMS with agitation; suspect viral meningitis. Intubated 11/6-11/12 and again 11/16-11/18 for airway protection after sedation. Transfered to [facility] on 11/15. EEG negative for ongoing seizures. Rapid response called for unresponsiveness and hypotension on 11/16. 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 Intubated 11/16-11/18. PMH including HTN, T2DM, vertigo, anxiety, chronic back pain, and HLD. Type of Study: Bedside Swallow Evaluation Previous Swallow Assessment: [facility] 11/15-recommended dys 3, thin liquid Diet Prior to this Study: NPO Temperature Spikes Noted: No Respiratory Status: Nasal cannula History of Recent Intubation: Yes Length of Intubations (days): 8 days (2 intubatations, 11/6-11/12, 11/16-11/18) Date extubated: 07/02/21 Behavior/Cognition: Alert;Cooperative Oral Cavity Assessment: Within Functional Limits Oral Care Completed by SLP: No Oral Cavity - Dentition: Poor condition;Missing dentition Vision: Functional for self-feeding Self-Feeding Abilities: Able to feed self Patient Positioning: Partially reclined;Postural control interferes with function Baseline Vocal Quality: Normal Volitional Cough: Weak Volitional Swallow: Able  to elicit    Oral/Motor/Sensory Function Overall Oral Motor/Sensory Function: Generalized oral weakness Lingual Strength: Reduced   Ice Chips Ice chips: Not tested   Thin Liquid Thin Liquid: Impaired Presentation: Cup;Straw;Self Fed Pharyngeal  Phase Impairments: Throat Clearing - Immediate;Cough - Immediate    Nectar Thick Nectar Thick Liquid: Impaired Presentation: Cup;Self Fed Pharyngeal Phase Impairments: Throat Clearing - Immediate;Throat Clearing - Delayed   Honey Thick Honey Thick Liquid: Not tested   Puree Puree: Within functional limits Presentation: Self Fed;Spoon   Solid     Solid: Within functional limits Presentation: Self Fed      Benen Weida 07/05/2021,9:56 AM

## 2021-07-05 NOTE — Procedures (Addendum)
Patient Name: Lauren Velazquez  MRN: [id number]  Epilepsy Attending: Charlsie Quest  Referring Physician/Provider: Dr. Caryl Pina Duration: 07/05/2021 0147 to 07/05/2021 1209   Patient history: 60 year old female with altered mental status and seizure.  EEG to evaluate for seizure.   Level of alertness:  awake, asleep   AEDs during EEG study: Keppra, Vimpat   Technical aspects: This EEG study was done with scalp electrodes positioned according to the 10-20 International system of electrode placement. Electrical activity was acquired at a sampling rate of 500Hz  and reviewed with a high frequency filter of 70Hz  and a low frequency filter of 1Hz . EEG data were recorded continuously and digitally stored.    Description: The posterior dominant rhythm consists of 8-9 Hz activity of moderate voltage (25-35 uV) seen predominantly in posterior head regions, symmetric and reactive to eye opening and eye closing. Sleep was characterized by vertex waves, sleep spindles (12 to 14 Hz), maximal frontocentral region. EEG showed intermittent generalized and maximal right temporal region 3 to 6 Hz theta-delta slowing. Hyperventilation and photic stimulation were not performed.      ABNORMALITY - Intermittent slow, generalized and maximal right temporal region   IMPRESSION: This study is suggestive of cortical dysfunction in right temporal region, non specific etiology as well as mild diffuse encephalopathy, nonspecific etiology. No seizures or definite epileptiform discharges were seen throughout the recording.   Lauren Velazquez 

## 2021-07-05 NOTE — Progress Notes (Signed)
On first interaction with patient at 0710, she immediately became verbally aggressive and threatening to "beat you all up".  She called the NT many racial slurs and proceeded to call me many other socially unacceptable words as well.   Dr Chestine Spore came in to room at @ 0805 and the patient was oriented and fairly pleasant.  She was asking to leave today or leave AMA to get home to her husband.   Once MD left the room, patient said she was going to get her daughter to bring a knife in to "break out of this bed and get you all". Ellyce Lafevers C 8:21 AM

## 2021-07-05 NOTE — Progress Notes (Addendum)
STROKE TEAM PROGRESS NOTE   SUBJECTIVE (INTERVAL HISTORY) Patient was seen in her room with no family at bedside.  She has been hemodynamically stable overnight. Agitation continues with nursing staff reporting that patient has been verbally aggressive and threatening this morning.  Patient continues to verbalize anger, verbalizes concerns about her husband and states she desires to leave AMA.  No other acute events noted.  Will need to discuss patient's condition with her family members to determine how her current mental status differs from her baseline.  Vital signs are stable.  Blood pressure adequately controlled. OBJECTIVE Vitals:   07/05/21 0900 07/05/21 1000 07/05/21 1100 07/05/21 1200  BP: (!) 157/69 (!) 161/67 (!) 181/82 (!) 182/73  Pulse: (!) 54 60 65 69  Resp: 15 (!) 24 20 19   Temp:      TempSrc:      SpO2: 99% 98% 100% 99%  Weight:      Height:        CBC:  Recent Labs  Lab 07/02/21 0355 07/03/21 0342  WBC 9.6 11.6*  NEUTROABS 8.0* 9.1*  HGB 8.9* 9.4*  HCT 31.0* 32.4*  MCV 84.7 82.4  PLT 407* 483*     Basic Metabolic Panel:  Recent Labs  Lab 07/01/21 0330 07/02/21 0355 07/03/21 0342  NA 141 140 141  K 4.0 4.4 3.6  CL 104 108 103  CO2 27 25 28   GLUCOSE 114* 260* 223*  BUN 10 17 9   CREATININE 0.84 0.67 0.53  CALCIUM 8.8* 8.7* 8.8*  MG 2.5* 2.5*  --   PHOS 3.5 3.0  --      Lipid Panel: No results for input(s): CHOL, TRIG, HDL, CHOLHDL, VLDL, LDLCALC in the last 168 hours. HgbA1c:  Lab Results  Component Value Date   HGBA1C 8.2 (H) 06/20/2021   Urine Drug Screen:     Component Value Date/Time   LABOPIA NONE DETECTED 06/19/2021 2257   COCAINSCRNUR NONE DETECTED 06/19/2021 2257   LABBENZ NONE DETECTED 06/19/2021 2257   AMPHETMU NONE DETECTED 06/19/2021 2257   THCU NONE DETECTED 06/19/2021 2257   LABBARB NONE DETECTED 06/19/2021 2257    Alcohol Level No results found for: [facility]  IMAGING  Results for orders placed or performed during the  hospital encounter of 06/30/21  MR BRAIN WO CONTRAST   Narrative   CLINICAL DATA:  Mental status change, unknown cause  EXAM: MRI HEAD WITHOUT CONTRAST  TECHNIQUE: Multiplanar, multiecho pulse sequences of the brain and surrounding structures were obtained without intravenous contrast.  COMPARISON:  06/21/2021.  FINDINGS: Brain: Scattered foci of restricted diffusion with ADC correlate, largely cortical, in both cerebral hemispheres, most of which are punctate, with larger areas of infarction right parietal lobe (series 5, image 85) and left occipital lobe (series 5, image 68). The largest areas are also associated with increased T2 signal and may be subacute. The vast majority of these are new compared to 06/21/2021, with only 1 seen on the prior exam (right inferior parietal lobe lobe, posterior to the right lateral ventricle). The bilateral frontal lobes appear least affected. No foci of restricted diffusion in the cerebellum.  No acute hemorrhage, mass, mass effect, or midline shift. No hydrocephalus or extra-axial collection.  Vascular: Normal flow voids.  Skull and upper cervical spine: Normal marrow signal.  Sinuses/Orbits: Mucosal thickening in the ethmoid air cells, maxillary sinuses, and sphenoid sinuses. The orbits are unremarkable.  Other: Fluid throughout the bilateral mastoid air cells.  IMPRESSION: Scattered acute and subacute infarcts in the bilateral  cerebral hemispheres, largely the MCA and PCA territories, the majority of which are new compared to 06/21/2021. The 2 largest areas of infarction, in the right parietal and left occipital lobes, are associated with increased T2 signal and are likely subacute. Given multiple vascular territories, embolic etiology is suspected, although a hypotensive episode could also produce this appearance.   Electronically Signed   By: Merilyn Baba M.D.   On: 07/01/2021 11:55   Results for orders placed or  performed during the hospital encounter of 06/19/21  MR BRAIN W WO CONTRAST   Narrative   CLINICAL DATA:  60 year old female with unexplained altered mental status. Sepsis.  EXAM: MRI HEAD WITHOUT AND WITH CONTRAST  TECHNIQUE: Multiplanar, multiecho pulse sequences of the brain and surrounding structures were obtained without and with intravenous contrast.  CONTRAST:  85mL GADAVIST GADOBUTROL 1 MMOL/ML IV SOLN  COMPARISON:  Head CT 06/19/2021.  FINDINGS: Brain: Scattered small cortical and occasionally white matter areas of restricted diffusion in both cerebral hemispheres. Many are punctate. Bilateral PCA, left MCA territories are affected. Minimal associated T2 and FLAIR hyperintensity. But no deep gray matter nuclei, brainstem or cerebellar involvement.  Superimposed small chronic lacunar infarct of the left caudate nucleus. Mild patchy bilateral periatrial white matter T2 and FLAIR hyperintensity.  No acute or chronic intracranial hemorrhage. No midline shift, mass effect, evidence of mass lesion, ventriculomegaly, extra-axial collection. Cervicomedullary junction and pituitary are within normal limits.  No abnormal enhancement identified.  No dural thickening.  Vascular: Major intracranial vascular flow voids are preserved. The major dural venous sinuses are enhancing and appear to be patent.  Skull and upper cervical spine: Negative visible cervical spine. Visualized bone marrow signal is within normal limits.  Sinuses/Orbits: Negative orbits. Trace paranasal sinus mucosal thickening.  Other: Mastoids are clear. Visible internal auditory structures appear normal. Intubated. Small volume retained secretions in the nasopharynx.  IMPRESSION: 1. Scattered punctate acute infarcts in both cerebral hemispheres (bilateral PCA and left MCA territories). No associated hemorrhage or mass effect. Although the posterior fossa and anterior cerebral artery territories are  spared, the pattern is suspicious for a recent Embolic event. Hypotensive episode might also produce this appearance.  2. 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   Electronically Signed   By: Genevie Ann M.D.   On: 06/21/2021 05:31   MR ANGIO HEAD WO CONTRAST   Narrative   CLINICAL DATA:  Initial evaluation for carotid stenosis screening.  EXAM: MRA NECK WITHOUT AND WITH CONTRAST  MRA HEAD WITHOUT CONTRAST  TECHNIQUE: Multiplanar and multiecho pulse sequences of the neck were obtained without and with intravenous contrast. Angiographic images of the neck were obtained using MRA technique without and with intravenous contrast; Angiographic images of the Circle of Willis were obtained using MRA technique without intravenous contrast.  CONTRAST:  60mL GADAVIST GADOBUTROL 1 MMOL/ML IV SOLN  COMPARISON:  Comparison made with prior brain MRI from 06/21/2021  FINDINGS: MRA NECK FINDINGS  AORTIC ARCH: Visualized aortic arch normal caliber with normal branch pattern. No hemodynamically significant stenosis about the origin of the great vessels.  RIGHT CAROTID SYSTEM: Right CCA patent from its origin to the bifurcation without stenosis. Atheromatous narrowing at the right carotid bulb/proximal right ICA with associated stenosis of up to 40-50% by NASCET criteria (series 1126, image 1). Right ICA otherwise patent distally without stenosis, evidence for dissection or occlusion.  LEFT CAROTID SYSTEM: Left CCA patent from its origin to  the bifurcation without stenosis. No significant atheromatous narrowing or irregularity about the left carotid bulb. Left ICA patent distally without stenosis, evidence for dissection or occlusion.  VERTEBRAL ARTERIES: Both vertebral arteries arise from the subclavian arteries. Vertebral arteries are largely codominant. Vertebral arteries mildly tortuous but are widely patent  without stenosis evidence for dissection or occlusion.  MRA HEAD FINDINGS  ANTERIOR CIRCULATION:  Distal cervical segments of the internal carotid arteries are widely patent with antegrade flow. Petrous, cavernous, and supraclinoid segments widely patent without stenosis or other abnormality. A1 segments patent bilaterally. Normal anterior communicating artery complex. Mild irregularity about the ACAs favored to be related to motion artifact. ACAs patent to their distal aspects without significant stenosis. No M1 stenosis or occlusion. Normal MCA bifurcations. Distal MCA branches well perfused and symmetric.  POSTERIOR CIRCULATION:  Both vertebral arteries patent to the vertebrobasilar junction without stenosis. Both PICA origins patent and normal. Basilar patent to its distal aspect without stenosis. Superior cerebellar arteries patent bilaterally. Both PCAs supplied via hypoplastic P1 segments and robust bilateral posterior communicating arteries. PCAs well perfused to their distal aspects without stenosis.  No intracranial aneurysm.  IMPRESSION: MRA HEAD IMPRESSION:  Negative intracranial MRA. No large vessel occlusion or hemodynamically significant stenosis.  MRA NECK IMPRESSION:  1. 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 2. Wide patency of the left carotid artery system within the neck. 3. Widely patent vertebral arteries within the neck.   Electronically Signed   By: Rise Mu M.D.   On: 06/25/2021 06:12   CT Head Wo Contrast   Narrative   CLINICAL DATA:  Altered mental status.  EXAM: CT HEAD WITHOUT CONTRAST  TECHNIQUE: Contiguous axial images were obtained from the base of the skull through the vertex without intravenous contrast.  COMPARISON:  None.  FINDINGS: Brain: Patient was scanned in the decubitus position. No intracranial hemorrhage, mass effect, or midline  shift. No hydrocephalus. The basilar cisterns are patent. No evidence of territorial infarct or acute ischemia. No extra-axial or intracranial fluid collection.  Vascular: No hyperdense vessel or unexpected calcification.  Skull: No fracture or focal lesion.  Sinuses/Orbits: No acute findings. Periapical lucencies are noted about multiple upper teeth.  Other: None.  IMPRESSION: No acute intracranial abnormality.   Electronically Signed   By: Narda Rutherford M.D.   On: 06/19/2021 22:51      PHYSICAL EXAM  General:  Patient is an obese female with evident agitation.  . Afebrile. Head is nontraumatic. Neck is supple without bruit.    Cardiac exam no murmur or gallop. Lungs are clear to auscultation. Distal pulses are well felt.   NEURO:  Exam limited due to agitation Mental Status: AA&Ox3 intermittently confused but can be redirected.  Diminished attention, registration and recall Speech/Language: speech is without dysarthria or aphasia.    Cranial Nerves:  II: PERRL.  III, IV, VI: Eyelids elevate symmetrically.  V: Unable to assess due to agitation VII: No facial droop evident VIII: hearing intact to voice. IX, X: Phonation is normal.  [id number] to assess due to agitation XII: Unable to assess due to agitation Motor: Moves all extremities purposefully Tone: is normal and bulk is normal Sensation- Unable to assess due to agitation Coordination: Unable to assess due to agitation Gait- deferred    ASSESSMENT/PLAN Ms. Lauren Velazquez is a 60 y.o. female with history of DM2, HTN, Vertigo, HLD, Anxiety, back pain, and COVID-19 presenting with altered mental status. Patient was  admitted on 06/20/2021 to [facility]- intubated and transferred to ICU, extubated 11/12. On 11/16 patient had an episode of complete unresponsiveness with foaming at the mouth and posturing with acute hypotension.  She was intubated emergently and transferred to the ICU.    Patient was evaluated by neurology and stat CT and CTA head and neck were negative for acute findings, she was loaded with 4g Keppra and transferred to [facility] hospital for continuous EEG. See timeline from [place] below.  Patient has had no seizure activity or other acute events overnight but continues to exhibit agitation and verbal aggression towards nursing staff.  Patient states that she wishes to leave AMA.  Will need to discuss patient's current state with her family to determine what her baseline is and whether she has support after discharge before this can be considered.  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, DDx including  Hypercoagulability with metabolic syndrome and infection - morbid obesity, uncontrolled DM, HLD, fever  Endocarditis - fever and leukocytosis, but blood culture neg, TEE no endocarditis Paradoxical emboli - TEE no PFO and LE venous doppler no acute DVT Infectious or noninfectious vasculitis - MRI no T2 FLAIR change, LP no meningitis or encephalitis, no elevated protein, CTA  and MRA neg APL - ANA neg, hypercoag pending, no hx of miscarriage ANA full panel ordered 11/19- pending  MRI head 11/17 Scattered acute and subacute infarcts in the bilateral cerebral hemispheres, largely the MCA and PCA territories, the majority of which are new compared to 06/21/2021.  MRA head 11/11- negative MRA head. Atheromatous narrowing up to 40-50% involving the right carotid bulb/prox RICA. Patent L carotid arterial system, patient vertebral arteries  CTA head and neck 11/16 - negative 2D Echo  - EF 60-65% TEE - No evidence of intraarterial shunt, no atrial appendage thrombus detected Venous duplex- Left: findings consistent with age indeterminate deep vein thrombosis involving the left posterior tibial veins LDL 133 HgbA1c 8.2 UDS neg TCD with bubble study scheduled for 07/03/2021 Hypercoagulable labs: PTT lupus anticoagulant 32.8, Beta-2 glycoprotein I IgG  <9, beta-2 glycoprotein I IgA <9, beta-2 glycoprotein IgM <9, Protein C 139, Total protein S 119, functional protein S 84. Heparin subq for VTE prophylaxis No antithrombotic prior to admission, now on aspirin 81 mg daily.  Ongoing aggressive stroke risk factor management Therapy recommendations:  CIR Disposition:  may be transferred out of ICU  Behavior disturbance  Reported bizarre behavior in ED Agitation in ICU but following commands and answer all questions appropriately Seizure episodes - on keppra and vipat LTM EEG discontinued 11/19- no epileptiform discharges noted EEG x 2 diffuse encephalopathy Seizure activity reported 11/19 overnight. EEG restarted 11/20 Discontinue LTM EEG 11/21 as no further seizure activity noted Keppra bolus and increased 1g BID CCM on board  Hypertension Hypertensive PO amlodipine and lisinopril PRN Hydralazine q4 for HTN BP goal < 180/105  Hyperlipidemia Home meds:  None LDL 133, goal < 70 Statin intolerance - due to swelling Zetia 10 Continue zetia at discharge  Diabetes type II HgbA1c 8.2, goal < 7.0 Uncontrolled SSI  CBG monitoring Close follow up with PCP recommended  Other Stroke Risk Factors Obesity, Body mass index is 46.36 kg/m., recommend weight loss, diet and exercise as appropriate  Coronary artery disease Obstructive sleep apnea, 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  Hospital day # 5  TIMELINE FROM [place]: 06/20/21- Admit to ICU  with suspected meningitis s/p mechanical intubation for airway protection in the setting of increased sedation needs and agitation. S/p LP @2 :37 AM. ID consult. Patient started empirically on vancomycin, ceftriaxone, ampicillin and acyclovir.  ID consult. 06/21/21- MRI w/out contrast shows acute infarcts (embolic vs. Hypotensive). Neuro, and psych consulted.  TTE showed normal LVEF, grade I diastolic dysfunction, no sig valvular abnormalities and no intracardiac clot.  rEEG showed moderate diffuse slowing with subtle superimposed right focal slowing without epileptiform abnormalities 06/22/21-persistent agitation off of sedation.  Started Depakene for mood stabilization and cyproheptadine for potential serotonergic symptoms. 06/23/21: Remains on the vent and sedated. 06/24/21: Remains on the vent. Neuro eval recs central embolic suspected.  Start ASA 81mg  daily + plavix 75mg  daily x21 days f/b ASA 81mg  daily after that.  Consider TEE 06/25/21: Remains on the vent and sedation.  Status post TEE negative for thrombus or vegetation. 06/26/21: S/p extubation now IVC with sitter at the bedside 06/27/21: Stable on [place] @2L  with fluctuating mental status. Sitter at bedside 06/28/21: Restart home dose of oxycodone for concern of possible withdrawal. EEG per neuro r/o seizure activity. Patient stable, able to be transferred to step down care. PCCM to sign sign 06/29/21: Transferred to Med/surg 06/30/21: Rapid response called for unresponsiveness and hypotension.  Upon arrival to ICU concern for seizure activity.  Intubated for airway protection.  Transferred to [facility] for continuous EEG, PCCM admit  [place] , MSN, AGACNP-BC [facility] See Amion for schedule and pager information 07/05/2021 1:20 PM I have personally obtained history,examined this patient, reviewed notes, independently viewed imaging studies,  participated in medical decision making and plan of care.ROS completed by me personally and pertinent positives fully documented  I have made any additions or clarifications directly to the above note. Agree with note above.  Wean off sedation as tolerated.  Transfer to floor bed.  Discussed with patient's daughter and she will likely need to be discharged home [place] if she insists on going home.  Patient counseled to be compliant with checking swallow eval and try to wean oxygen as tolerated.  Long discussion with patient and with Dr. Carlis Abbott critical care medicine.  May need to consider outpatient TEE and cardiac monitoring after discharge if stable and improving to follow-up as an outpatient.Greater than 50% time during this 35-minute visit was spent in counseling and coordination of care and discussion with patient and care team and answering questions.  Antony Contras, MD Medical Director [facility] Pager: [phone] 07/05/2021 1:57 PM

## 2021-07-06 DIAGNOSIS — G9341 Metabolic encephalopathy: Secondary | ICD-10-CM

## 2021-07-06 LAB — PROTEIN C, TOTAL: Protein C, Total: 117 % (ref 60–150)

## 2021-07-06 LAB — BASIC METABOLIC PANEL
Anion gap: 10 (ref 5–15)
BUN: 9 mg/dL (ref 6–20)
CO2: 28 mmol/L (ref 22–32)
Calcium: 8.8 mg/dL — ABNORMAL LOW (ref 8.9–10.3)
Chloride: 104 mmol/L (ref 98–111)
Creatinine, Ser: 0.64 mg/dL (ref 0.44–1.00)
GFR, Estimated: >60 mL/min (ref >60)
Glucose, Bld: 155 mg/dL — ABNORMAL HIGH (ref 70–99)
Potassium: 3.6 mmol/L (ref 3.5–5.1)
Sodium: 142 mmol/L (ref 135–145)

## 2021-07-06 LAB — CBC
HCT: 31.7 % — ABNORMAL LOW (ref 36.0–46.0)
Hemoglobin: 9.3 g/dL — ABNORMAL LOW (ref 12.0–15.0)
MCH: 24.1 pg — ABNORMAL LOW (ref 26.0–34.0)
MCHC: 29.3 g/dL — ABNORMAL LOW (ref 30.0–36.0)
MCV: 82.1 fL (ref 80.0–100.0)
Platelets: 429 10*3/uL — ABNORMAL HIGH (ref 150–400)
RBC: 3.86 MIL/uL — ABNORMAL LOW (ref 3.87–5.11)
RDW: 16.6 % — ABNORMAL HIGH (ref 11.5–15.5)
WBC: 11.9 10*3/uL — ABNORMAL HIGH (ref 4.0–10.5)
nRBC: 0 % (ref 0.0–0.2)

## 2021-07-06 LAB — GLUCOSE, CAPILLARY
Glucose-Capillary: 109 mg/dL — ABNORMAL HIGH (ref 70–99)
Glucose-Capillary: 119 mg/dL — ABNORMAL HIGH (ref 70–99)
Glucose-Capillary: 124 mg/dL — ABNORMAL HIGH (ref 70–99)
Glucose-Capillary: 178 mg/dL — ABNORMAL HIGH (ref 70–99)
Glucose-Capillary: 185 mg/dL — ABNORMAL HIGH (ref 70–99)
Glucose-Capillary: 185 mg/dL — ABNORMAL HIGH (ref 70–99)
Glucose-Capillary: 193 mg/dL — ABNORMAL HIGH (ref 70–99)

## 2021-07-06 LAB — CARDIOLIPIN ANTIBODIES, IGG, IGM, IGA
Anticardiolipin IgA: <9 APL U/mL (ref 0–11)
Anticardiolipin IgG: <9 GPL U/mL (ref 0–14)
Anticardiolipin IgM: 12 MPL U/mL (ref 0–12)

## 2021-07-06 LAB — ANTINUCLEAR ANTIBODIES, IFA: ANA Ab, IFA: NEGATIVE

## 2021-07-06 MED ORDER — OXYCODONE HCL 5 MG PO TABS
5.0000 mg | ORAL_TABLET | Freq: Three times a day (TID) | ORAL | Status: DC | PRN
  Administered 2021-07-06 – 2021-07-07 (×2): 5 mg via ORAL
  Filled 2021-07-06 (×2): qty 1

## 2021-07-06 MED ORDER — OXYCODONE HCL 5 MG PO TABS: 5.0000 mg | ORAL_TABLET | Freq: Four times a day (QID) | ORAL | Status: DC | PRN

## 2021-07-06 MED ORDER — OXYCODONE-ACETAMINOPHEN 5-325 MG PO TABS
1.0000 | ORAL_TABLET | Freq: Three times a day (TID) | ORAL | Status: DC | PRN
  Administered 2021-07-06 – 2021-07-07 (×3): 1 via ORAL
  Filled 2021-07-06 (×3): qty 1

## 2021-07-06 MED ORDER — OXYCODONE-ACETAMINOPHEN 10-325 MG PO TABS: 1.0000 | ORAL_TABLET | Freq: Four times a day (QID) | ORAL | Status: DC | PRN

## 2021-07-06 MED ORDER — OXYCODONE-ACETAMINOPHEN 5-325 MG PO TABS: 1.0000 | ORAL_TABLET | Freq: Four times a day (QID) | ORAL | Status: DC | PRN

## 2021-07-06 NOTE — Progress Notes (Addendum)
STROKE TEAM PROGRESS NOTE   SUBJECTIVE (INTERVAL HISTORY) Patient was seen in her room with her husband at the bedside.  Agitation has improved and patient is cooperative this morning.  She realizes that she has right leg pain and difficulty with ambulation and will likely need some time for therapy before she will recovers.  She is agreeable to staying in the hospital and going to rehab to regain her mobility.  She does c/o chronic back pain and asks to be restarted on her home pain control regimen. Vital signs are stable, and her O2 requirements are at baseline (3L/min) OBJECTIVE Vitals:   07/06/21 0039 07/06/21 0433 07/06/21 0911 07/06/21 0921  BP: (!) 143/71 (!) 128/53  (!) 174/76  Pulse: (!) 102 87    Resp: 19     Temp: 98.2 F (36.8 C) 98.1 F (36.7 C)    TempSrc: Axillary Oral    SpO2: 99% 98% 99%   Weight:      Height:        CBC:  Recent Labs  Lab 07/02/21 0355 07/03/21 0342 07/06/21 0159  WBC 9.6 11.6* 11.9*  NEUTROABS 8.0* 9.1*  --   HGB 8.9* 9.4* 9.3*  HCT 31.0* 32.4* 31.7*  MCV 84.7 82.4 82.1  PLT 407* 483* 429*     Basic Metabolic Panel:  Recent Labs  Lab 07/01/21 0330 07/02/21 0355 07/03/21 0342 07/06/21 0159  NA 141 140 141 142  K 4.0 4.4 3.6 3.6  CL 104 108 103 104  CO2 27 25 28 28   GLUCOSE 114* 260* 223* 155*  BUN 10 17 9 9   CREATININE 0.84 0.67 0.53 0.64  CALCIUM 8.8* 8.7* 8.8* 8.8*  MG 2.5* 2.5*  --   --   PHOS 3.5 3.0  --   --      Lipid Panel: No results for input(s): CHOL, TRIG, HDL, CHOLHDL, VLDL, LDLCALC in the last 168 hours. HgbA1c:  Lab Results  Component Value Date   HGBA1C 8.2 (H) 06/20/2021   Urine Drug Screen:     Component Value Date/Time   LABOPIA NONE DETECTED 06/19/2021 2257   COCAINSCRNUR NONE DETECTED 06/19/2021 2257   LABBENZ NONE DETECTED 06/19/2021 2257   AMPHETMU NONE DETECTED 06/19/2021 2257   THCU NONE DETECTED 06/19/2021 2257   LABBARB NONE DETECTED 06/19/2021 2257    Alcohol Level No results found for:  [facility]  IMAGING  Results for orders placed or performed during the hospital encounter of 06/30/21  MR BRAIN WO CONTRAST   Narrative   CLINICAL DATA:  Mental status change, unknown cause  EXAM: MRI HEAD WITHOUT CONTRAST  TECHNIQUE: Multiplanar, multiecho pulse sequences of the brain and surrounding structures were obtained without intravenous contrast.  COMPARISON:  06/21/2021.  FINDINGS: Brain: Scattered foci of restricted diffusion with ADC correlate, largely cortical, in both cerebral hemispheres, most of which are punctate, with larger areas of infarction right parietal lobe (series 5, image 85) and left occipital lobe (series 5, image 68). The largest areas are also associated with increased T2 signal and may be subacute. The vast majority of these are new compared to 06/21/2021, with only 1 seen on the prior exam (right inferior parietal lobe lobe, posterior to the right lateral ventricle). The bilateral frontal lobes appear least affected. No foci of restricted diffusion in the cerebellum.  No acute hemorrhage, mass, mass effect, or midline shift. No hydrocephalus or extra-axial collection.  Vascular: Normal flow voids.  Skull and upper cervical spine: Normal marrow signal.  Sinuses/Orbits: Mucosal  thickening in the ethmoid air cells, maxillary sinuses, and sphenoid sinuses. The orbits are unremarkable.  Other: Fluid throughout the bilateral mastoid air cells.  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 The 2 largest areas of infarction, in the right parietal and left occipital lobes, are associated with increased T2 signal and are likely subacute. Given multiple vascular territories, embolic etiology is suspected, although a hypotensive episode could also produce this appearance.   Electronically Signed   By: Merilyn Baba  M.D.   On: 07/01/2021 11:55   Results for orders placed or performed during the hospital encounter of 06/19/21  MR BRAIN W WO CONTRAST   Narrative   CLINICAL DATA:  60 year old female with unexplained altered mental status. Sepsis.  EXAM: MRI HEAD WITHOUT AND WITH CONTRAST  TECHNIQUE: Multiplanar, multiecho pulse sequences of the brain and surrounding structures were obtained without and with intravenous contrast.  CONTRAST:  83mL GADAVIST GADOBUTROL 1 MMOL/ML IV SOLN  COMPARISON:  Head CT 06/19/2021.  FINDINGS: Brain: Scattered small cortical and occasionally white matter areas of restricted diffusion in both cerebral hemispheres. Many are punctate. Bilateral PCA, left MCA territories are affected. Minimal associated T2 and FLAIR hyperintensity. But no deep gray matter nuclei, brainstem or cerebellar involvement.  Superimposed small chronic lacunar infarct of the left caudate nucleus. Mild patchy bilateral periatrial white matter T2 and FLAIR hyperintensity.  No acute or chronic intracranial hemorrhage. No midline shift, mass effect, evidence of mass lesion, ventriculomegaly, extra-axial collection. Cervicomedullary junction and pituitary are within normal limits.  No abnormal enhancement identified.  No dural thickening.  Vascular: Major intracranial vascular flow voids are preserved. The major dural venous sinuses are enhancing and appear to be patent.  Skull and upper cervical spine: Negative visible cervical spine. Visualized bone marrow signal is within normal limits.  Sinuses/Orbits: Negative orbits. Trace paranasal sinus mucosal thickening.  Other: Mastoids are clear. Visible internal auditory structures appear normal. Intubated. Small volume retained secretions in the nasopharynx.  IMPRESSION: 1. Scattered punctate acute infarcts in both cerebral hemispheres (bilateral PCA and left MCA territories). No associated hemorrhage or mass effect. Although the  posterior fossa and anterior cerebral artery territories are spared, the pattern is suspicious for a recent Embolic event. Hypotensive episode might also produce this appearance.  2. 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   Electronically Signed   By: Genevie Ann M.D.   On: 06/21/2021 05:31   MR ANGIO HEAD WO CONTRAST   Narrative   CLINICAL DATA:  Initial evaluation for carotid stenosis screening.  EXAM: MRA NECK WITHOUT AND WITH CONTRAST  MRA HEAD WITHOUT CONTRAST  TECHNIQUE: Multiplanar and multiecho pulse sequences of the neck were obtained without and with intravenous contrast. Angiographic images of the neck were obtained using MRA technique without and with intravenous contrast; Angiographic images of the Circle of Willis were obtained using MRA technique without intravenous contrast.  CONTRAST:  46mL GADAVIST GADOBUTROL 1 MMOL/ML IV SOLN  COMPARISON:  Comparison made with prior brain MRI from 06/21/2021  FINDINGS: MRA NECK FINDINGS  AORTIC ARCH: Visualized aortic arch normal caliber with normal branch pattern. No hemodynamically significant stenosis about the origin of the great vessels.  RIGHT CAROTID SYSTEM: Right CCA patent from its origin to the bifurcation without stenosis. Atheromatous narrowing at the right carotid bulb/proximal right ICA with associated stenosis  of up to 40-50% by NASCET criteria (series 1126, image 1). Right ICA otherwise patent distally without stenosis, evidence for dissection or occlusion.  LEFT CAROTID SYSTEM: Left CCA patent from its origin to the bifurcation without stenosis. No significant atheromatous narrowing or irregularity about the left carotid bulb. Left ICA patent distally without stenosis, evidence for dissection or occlusion.  VERTEBRAL ARTERIES: Both vertebral arteries arise from the subclavian arteries. Vertebral arteries are largely  codominant. Vertebral arteries mildly tortuous but are widely patent without stenosis evidence for dissection or occlusion.  MRA HEAD FINDINGS  ANTERIOR CIRCULATION:  Distal cervical segments of the internal carotid arteries are widely patent with antegrade flow. Petrous, cavernous, and supraclinoid segments widely patent without stenosis or other abnormality. A1 segments patent bilaterally. Normal anterior communicating artery complex. Mild irregularity about the ACAs favored to be related to motion artifact. ACAs patent to their distal aspects without significant stenosis. No M1 stenosis or occlusion. Normal MCA bifurcations. Distal MCA branches well perfused and symmetric.  POSTERIOR CIRCULATION:  Both vertebral arteries patent to the vertebrobasilar junction without stenosis. Both PICA origins patent and normal. Basilar patent to its distal aspect without stenosis. Superior cerebellar arteries patent bilaterally. Both PCAs supplied via hypoplastic P1 segments and robust bilateral posterior communicating arteries. PCAs well perfused to their distal aspects without stenosis.  No intracranial aneurysm.  IMPRESSION: MRA HEAD IMPRESSION:  Negative intracranial MRA. No large vessel occlusion or hemodynamically significant stenosis.  MRA NECK IMPRESSION:  1. 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 2. Wide patency of the left carotid artery system within the neck. 3. Widely patent vertebral arteries within the neck.   Electronically Signed   By: Jeannine Boga M.D.   On: 06/25/2021 06:12   CT Head Wo Contrast   Narrative   CLINICAL DATA:  Altered mental status.  EXAM: CT HEAD WITHOUT CONTRAST  TECHNIQUE: Contiguous axial images were obtained from the base of the skull through the vertex without intravenous contrast.  COMPARISON:  None.  FINDINGS: Brain: Patient was scanned in the  decubitus position. No intracranial hemorrhage, mass effect, or midline shift. No hydrocephalus. The basilar cisterns are patent. No evidence of territorial infarct or acute ischemia. No extra-axial or intracranial fluid collection.  Vascular: No hyperdense vessel or unexpected calcification.  Skull: No fracture or focal lesion.  Sinuses/Orbits: No acute findings. Periapical lucencies are noted about multiple upper teeth.  Other: None.  IMPRESSION: No acute intracranial abnormality.   Electronically Signed   By: Keith Rake M.D.   On: 06/19/2021 22:51      PHYSICAL EXAM  General:  Patient is an obese female in no acute distress.   NEURO:  Mental Status: AA&Ox3  Speech/Language: speech is without dysarthria or aphasia.  Fluency, and comprehension intact.  Cranial Nerves:  II: PERRL. Visual fields full.  III, IV, VI: EOMI. Eyelids elevate symmetrically.  V: Sensation is intact to light touch and symmetrical to face.  VII: Smile is symmetrical.  VIII: hearing intact to voice. IX, X: Phonation is normal.  XII: tongue is midline without fasciculations. Motor: 5/5 strength to LUE, LLE and RUE.  Diminished strength in RLE but effort is poor and also limited due to pain. Sensation- Intact to light touch bilaterally.  Coordination: FTN intact bilaterally, No drift.  Gait- deferred     ASSESSMENT/PLAN Ms. Laylamarie Meskill is a 60 y.o. female with history of DM2, HTN, Vertigo, HLD, Anxiety, back pain, and COVID-19 presenting  with altered mental status. Patient was admitted on 06/20/2021 to [facility]- intubated and transferred to ICU, extubated 11/12. On 11/16 patient had an episode of complete unresponsiveness with foaming at the mouth and posturing with acute hypotension.  She was intubated emergently and transferred to the ICU.   Patient was evaluated by neurology and stat CT and CTA head and neck were negative for acute findings, she was loaded  with 4g Keppra and transferred to [facility] hospital for continuous EEG. See timeline from [place] below.  Patient has had no seizure activity or other acute events overnight. She has exhibited significant agitation and verbal aggression towards nursing staff but now appears calm and is amenable to going to rehab to regain mobility.  Awaiting some results of hypercoagulability workup.  Patient may need loop recorder for detection of atrial fibrillation.  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, DDx including  Hypercoagulability with metabolic syndrome and infection - morbid obesity, uncontrolled DM, HLD, fever  Endocarditis - fever and leukocytosis, but blood culture neg, TEE no endocarditis Paradoxical emboli - TEE no PFO and LE venous doppler no acute DVT Infectious or noninfectious vasculitis - MRI no T2 FLAIR change, LP no meningitis or encephalitis, no elevated protein, CTA  and MRA neg APL - ANA neg, hypercoag pending, no hx of miscarriage ANA full panel ordered 11/19- pending  MRI head 11/17 Scattered acute and subacute infarcts in the bilateral cerebral hemispheres, largely the MCA and PCA territories, the majority of which are new compared to 06/21/2021.  MRA head 11/11- negative MRA head. Atheromatous narrowing up to 40-50% involving the right carotid bulb/prox RICA. Patent L carotid arterial system, patient vertebral arteries  CTA head and neck 11/16 - negative 2D Echo  - EF 60-65% TEE - No evidence of intraarterial shunt, no atrial appendage thrombus detected Venous duplex- Left: findings consistent with age indeterminate deep vein thrombosis involving the left posterior tibial veins LDL 133 HgbA1c 8.2 UDS neg TCD with bubble study not done due to patient's lack of cooperation  hypercoagulable labs: PTT lupus anticoagulant 32.8, Beta-2 glycoprotein I IgG <9, beta-2 glycoprotein I IgA <9, beta-2 glycoprotein IgM <9, Protein C 139, Total protein S 119, functional  protein S 84. Heparin subq for VTE prophylaxis No antithrombotic prior to admission, now on aspirin 81 mg daily.  Ongoing aggressive stroke risk factor management Therapy recommendations:  CIR Disposition: pending  Behavior disturbance  Reported bizarre behavior in ED Agitation in ICU but following commands and answer all questions appropriately Seizure episodes - on keppra and vipat LTM EEG discontinued 11/19- no epileptiform discharges noted EEG x 2 diffuse encephalopathy Seizure activity reported 11/19 overnight. EEG restarted 11/20 Discontinue LTM EEG 11/21 as no further seizure activity noted Keppra bolus and increased 1g BID CCM on board Agitation not evident today  Hypertension Hypertensive PO amlodipine and lisinopril PRN Hydralazine q4 for HTN BP goal < 180/105  Hyperlipidemia Home meds:  None LDL 133, goal < 70 Statin intolerance - due to swelling Zetia 10 Continue zetia at discharge  Diabetes type II HgbA1c 8.2, goal < 7.0 Uncontrolled SSI  CBG monitoring Close follow up with PCP recommended  Other Stroke Risk Factors Obesity, Body mass index is 46.36 kg/m., recommend weight loss, diet and exercise as appropriate  Coronary artery disease Obstructive sleep apnea, 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  Hospital day # 6  TIMELINE FROM [place]: 06/20/21- Admit to ICU  with suspected meningitis s/p  mechanical intubation for airway protection in the setting of increased sedation needs and agitation. S/p LP @2 :37 AM. ID consult. Patient started empirically on vancomycin, ceftriaxone, ampicillin and acyclovir.  ID consult. 06/21/21- MRI w/out contrast shows acute infarcts (embolic vs. Hypotensive). Neuro, and psych consulted.  TTE showed normal LVEF, grade I diastolic dysfunction, no sig valvular abnormalities and no intracardiac clot.  rEEG showed moderate diffuse slowing with subtle superimposed right focal slowing without epileptiform abnormalities 06/22/21-persistent agitation off of sedation.  Started Depakene for mood stabilization and cyproheptadine for potential serotonergic symptoms. 06/23/21: Remains on the vent and sedated. 06/24/21: Remains on the vent. Neuro eval recs central embolic suspected.  Start ASA 81mg  daily + plavix 75mg  daily x21 days f/b ASA 81mg  daily after that.  Consider TEE 06/25/21: Remains on the vent and sedation.  Status post TEE negative for thrombus or vegetation. 06/26/21: S/p extubation now IVC with sitter at the bedside 06/27/21: Stable on [place] @2L  with fluctuating mental status. Sitter at bedside 06/28/21: Restart home dose of oxycodone for concern of possible withdrawal. EEG per neuro r/o seizure activity. Patient stable, able to be transferred to step down care. PCCM to sign sign 06/29/21: Transferred to Med/surg 06/30/21: Rapid response called for unresponsiveness and hypotension.  Upon arrival to ICU concern for seizure activity.  Intubated for airway protection.  Transferred to [facility] for continuous EEG, PCCM admit  [place] , MSN, AGACNP-BC [facility] See Amion for schedule and pager information 07/06/2021 11:57 AM I have personally obtained history,examined this patient, reviewed notes, independently viewed imaging studies, participated in medical decision making and plan of care.ROS completed by me personally and pertinent positives  fully documented  I have made any additions or clarifications directly to the above note. Agree with note above.  Patient seems to have improved mentation now and is more cooperative.  Recommend ongoing therapy and transfer to rehab.  Follow hypercoagulable panel labs.  May consider 30-day heart monitor at discharge for paroxysmal A. fib at discharge.  Discussed with patient, husband and Dr. Maren Beach.  Greater than 50% time during this 25-minute visit was spent in counseling and coordination of care and discussion with patient and care team and answering questions  Antony Contras, MD Medical Director [place] Pager: [phone] 07/06/2021 1:42 PM

## 2021-07-06 NOTE — Progress Notes (Signed)
Speech Language Pathology Treatment: Dysphagia  Patient Details Name: Lauren Velazquez MRN: [id number] DOB: 1961-01-12 Today's Date: 07/06/2021 Time: 1015-1030 SLP Time Calculation (min) (ACUTE ONLY): 15 min  Assessment / Plan / Recommendation Clinical Impression  Pt was seen for therapy targeting swallowing goals. Pt reports no difficulty with regular/thin liquid meal trays from previous day or this morning. SLP observed pt with thin liquid and regular solids, noting no overt oral or pharyngeal phase impairments. Pt asking many questions re: rehabilitation, insurance, and pain medication. SLP referred pt to doctor and social worker to discuss questions. Recommend continue regular/thin liquid diet, administering medications whole with thin liquid. Pt requires no further f/u from ST.    HPI HPI: 60 yo female presenting to [facility] ED on 11/6 with AMS with agitation; suspect viral meningitis. Intubated 11/6-11/12 and again 11/16-11/18 for airway protection after sedation. Transfered to [facility] on 11/15. EEG negative for ongoing seizures. Rapid response called for unresponsiveness and hypotension on 11/16. 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 Intubated 11/16-11/18. PMH including HTN, T2DM, vertigo, anxiety, chronic back pain, and HLD.      SLP Plan  All goals met      Recommendations for follow up therapy are one component of a multi-disciplinary discharge planning process, led by the attending physician.  Recommendations may be updated based on patient status, additional functional criteria and insurance authorization.    Recommendations  Diet recommendations: Regular;Thin liquid Liquids provided via: Straw;Cup Medication Administration: Whole meds with liquid Supervision: Patient able to self feed                Oral Care Recommendations: Oral care BID Follow Up Recommendations: Follow physician's  recommendations for discharge plan and follow up therapies Assistance recommended at discharge: None SLP Visit Diagnosis: Dysphagia, unspecified (R13.10) Plan: All goals met       GO              Dewitt Rota, SLP-Student   Dewitt Rota  07/06/2021, 10:39 AM

## 2021-07-06 NOTE — Progress Notes (Addendum)
Physical Therapy Treatment Patient Details Name: Lauren Velazquez MRN: [id number] DOB: [birth year] Today's Date: 07/06/2021   History of Present Illness 60 yo female presenting to [facility] ED on 11/6 with AMS with agitation; suspect viral meningitis. Intubated 11/6-11/12 for airway protection after sedation. Transfered to [facility] on 11/15. EEG negative for ongoing seizures. Rapid response called for unresponsiveness and hypotension on 11/16. 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 Intubated 11/16-11/18. PMH including HTN, T2DM, vertigo, anxiety, chronic back pain, and HLD.    PT Comments    Pt received upright in chair and agreeable to therapy session with excellent participation. Pt demonstrates impulsivity with transfers but receptive to safety instructions when given. Emphasis on safe hand placement and use of RW, heavy cues for upright posture and increasing step length with household distance gait tasks. Pt remains on 3LO2 with no complaints of dizziness or SOB during session but needs frequent seated breaks (chair follow during gait) due to LE fatigue. Continue to recommend AIR upon discharge to address remaining deficits. Pt continues to benefit from PT services to progress toward functional mobility goals.      Recommendations for follow up therapy are one component of a multi-disciplinary discharge planning process, led by the attending physician.  Recommendations may be updated based on patient status, additional functional criteria and insurance authorization.  Follow Up Recommendations  Acute inpatient rehab (3hours/day)     Assistance Recommended at Discharge Frequent or constant Supervision/Assistance  Equipment Recommendations  Other (comment) (TBD)    Recommendations for Other Services       Precautions / Restrictions Precautions Precautions: Fall Restrictions Weight Bearing Restrictions: No      Mobility  Bed Mobility  General bed mobility comments: Pt received and left in chair    Transfers Overall transfer level: Needs assistance Equipment used: Rolling walker (2 wheels) Transfers: Sit to/from Stand Sit to Stand: Min assist;+2 safety/equipment   General transfer comment: up to minA for STS with cues for hand placement with fair carryover, steadying upon standing    Ambulation/Gait Ambulation/Gait assistance: Mod assist;+2 safety/equipment (chair follow) Gait Distance (Feet): 40 Feet (40 ft, seated break, 15 ft, seated break, 25 ft) Assistive device: Rolling walker (2 wheels) Gait Pattern/deviations: Step-to pattern;Trunk flexed;Drifts right/left;Decreased stride length Gait velocity: decreased     General Gait Details: Pt requiring up to modA with chair follow for safety, cues for upright posture, increased step length; drifts to R side and needs cues for correction    Modified Rankin (Stroke Patients Only) Modified Rankin (Stroke Patients Only) Pre-Morbid Rankin Score: No symptoms Modified Rankin: Moderately severe disability     Balance Overall balance assessment: Needs assistance Sitting-balance support: Bilateral upper extremity supported;Feet supported Sitting balance-Leahy Scale: Fair    Standing balance support: Bilateral upper extremity supported;During functional activity;Reliant on assistive device for balance Standing balance-Leahy Scale: Poor Standing balance comment: reliant on external support      Cognition Arousal/Alertness: Awake/alert Behavior During Therapy: Restless;Impulsive Overall Cognitive Status: Impaired/Different from baseline Area of Impairment: Attention;Memory;Following commands;Safety/judgement;Problem solving;Awareness   Current Attention Level: Sustained Memory: Decreased short-term memory Following Commands: Follows one step commands with increased time Safety/Judgement: Decreased awareness of deficits;Decreased  awareness of safety Awareness: Intellectual Problem Solving: Slow processing;Difficulty sequencing;Requires tactile cues;Requires verbal cues General Comments: Pt recalling that she was "nasty" the other day and is very apologetic      Exercises Other Exercises Other Exercises: Ankle pumps x10    General  Comments General comments (skin integrity, edema, etc.): No c/o dizziness during session, modified RPE 4/10 during ambulation      Pertinent Vitals/Pain Pain Assessment: Faces Faces Pain Scale: Hurts little more Pain Descriptors / Indicators: Aching;Discomfort Pain Intervention(s): Monitored during session     PT Goals (current goals can now be found in the care plan section) Acute Rehab PT Goals Patient Stated Goal: did not state PT Goal Formulation: With patient Time For Goal Achievement: 07/16/21 Progress towards PT goals: Progressing toward goals    Frequency    Min 4X/week      PT Plan Current plan remains appropriate       AM-PAC PT "6 Clicks" Mobility   Outcome Measure  Help needed turning from your back to your side while in a flat bed without using bedrails?: A Little Help needed moving from lying on your back to sitting on the side of a flat bed without using bedrails?: A Little Help needed moving to and from a bed to a chair (including a wheelchair)?: A Lot (mod+ cues for all below) Help needed standing up from a chair using your arms (e.g., wheelchair or bedside chair)?: A Lot Help needed to walk in hospital room?: A Lot Help needed climbing 3-5 steps with a railing? : Total 6 Click Score: 13    End of Session Equipment Utilized During Treatment: Gait belt;Oxygen (3L) Activity Tolerance: Patient tolerated treatment well Patient left: in chair;with call bell/phone within reach;with chair alarm set Nurse Communication: Mobility status PT Visit Diagnosis: Unsteadiness on feet (R26.81);Muscle weakness (generalized) (M62.81);Other abnormalities of gait  and mobility (R26.89);Other symptoms and signs involving the nervous system (R29.898)     Time: 4008-6761 PT Time Calculation (min) (ACUTE ONLY): 32 min  Charges:  $Gait Training: 8-22 mins $Therapeutic Activity: 8-22 mins                     Harland German, Student PTA CI: Carly P., PTA  Carly M Poff 07/06/2021, 5:46 PM

## 2021-07-06 NOTE — Progress Notes (Signed)
PROGRESS NOTE    Lauren Velazquez  [id number] DOB: Sep 23, 1960 DOA: 06/30/2021 PCP: Denton Lank, MD   Brief Narrative/Hospital Course: Lauren Velazquez, 60 y.o. female with PMH of diabetes hypertension chronic low back pain chronic right leg pain on chronic high-dose opiates, asthma, chronic hypoxic aspiratory failure on 3.5 L nasal cannula presented to [facility] with erratic behavior on 11/5-multiple complaints of chest pain shortness of breath, agitated bizarre behavior, trying to stick her finger down the throat to make her self vomit. Refer to previous ED notes in detail patient was placed on IVC, while in ED became febrile, septic with severely altered mental status concerning for meningitis.  Other differential included viral meningitis sturgeon syndrome opioid withdrawal.  Chest x-ray UA no obvious source of infection.  She had increasing agitation refractory to benzodiazepine then subsequently sedated and intubated and had LP done.  MRI 11/7-showed acute infarct.  Was successfully extubated 11/12. 11/15 transferred to [place] On 11/16 transferred to the floor intermittently drowsy throughout the day then had an episode of complete unresponsiveness with foaming and posturing, acute hypotension, was intubated emergently and transferred to ICU seen by neurology given Keppra and transferred to [facility] for continuous EEG 11/22 to [place]  Significant event 06/20/21- Admit to ICU  with suspected meningitis s/p mechanical intubation for airway protection in the setting of increased sedation needs and agitation. S/p LP @2 :37 AM. ID consult. Patient started empirically on vancomycin, ceftriaxone, ampicillin and acyclovir.  ID consult. 06/21/21- MRI w/out contrast shows acute infarcts (embolic vs. Hypotensive). Neuro, and psych consulted.  TTE showed normal LVEF, grade I diastolic dysfunction, no sig valvular abnormalities and no intracardiac clot.  rEEG showed moderate diffuse slowing with subtle superimposed  right focal slowing without epileptiform abnormalities 06/22/21-persistent agitation off of sedation.  Started Depakene for mood stabilization and cyproheptadine for potential serotonergic symptoms. 06/23/21: Remains on the vent and sedated. 06/24/21: Remains on the vent. Neuro eval recs central embolic suspected.  Start ASA 81mg  daily + plavix 75mg  daily x21 days f/b ASA 81mg  daily after that.  Consider TEE 06/25/21: Remains on the vent and sedation.  Status post TEE negative for thrombus or vegetation. 06/26/21: S/p extubation now IVC with sitter at the bedside 06/27/21: Stable on [place] @2L  with fluctuating mental status. Sitter at bedside 06/28/21: Restart home dose of oxycodone for concern of possible withdrawal. EEG per neuro r/o seizure activity. Patient stable, able to be transferred to step down care.  06/29/21: Transferred to Med/surg 06/30/21: Rapid response called for unresponsiveness and hypotension.  Upon arrival to ICU concern for seizure activity.  Intubated for airway protection.  Transferred to [facility] for continuous EEG, PCCM admit 11/17 no further seizures. More scattered punctate infarcts on MRI compared to 11/7  11/18 extubated  11/19 had 2 witnessed seizures, loaded with Keppra, on EEG monitoring   Subjective:  Seen this morning alert awake oriented sitting on the chair husband at the bedside.  She reports her right leg pain is not controlled asking to resume her home dose of Percocet 10. On 3 L [place] and BP:120s to 170s.Afebrile overnight.  Assessment & Plan:  Acute metabolic encephalopathy: Unclear etiology could be multifactorial could be from medication related, mostly agitated.  Now more appears to be more stable off ICU, on Seroquel 50 twice daily.  Will get psych to see her as well  Chronic pain on opiates and Neurontin at home this morning complains of back pain and right leg pain chronic.  Reports she does get a steroid  injection on the back and has a pain medicine contract  with her doctor.Takes Percocet 10-325 5 times a day, will start with every 8 hour as needed and slowly uptitrate if she tolerates.  Tramadol on hold due to seizures.  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  Duplex shows left  leg with age indeterminate DVT on 07/02/20.  Neurology following.  Completed a stroke work-up echo shows EF 60 to 65%, TEE no shunt, LDL 133, HbA1c 8.2. On asa 81 mg, Zetia.  Not on statins due to intolerance.  Continue PT OT.  Seizure episode:continue Keppra Vimpat as per neurology.  Of continuous EEG.  Continue seizure precaution, no driving x6 months.  left  leg with age indeterminate distal DVT on 07/02/20-hemoglobin is stable. Repeat ultrasound- had difficulty with 1st [place] due to patient S agitation. Check D-dimer to assess risk. I discussed with Dr Benay Spice- he recommends anticoagulation- will await repeat Duplex and if positive then start [place].   Acute on chronic hypoxic respiratory failure extubated 11/18 OSA Chronic restrictive lung disease post COVID Possible aspiration event Reports using 3.5 L nocturnal oxygen at home which is her current setting.  Followed by [facility].Completed Unasyn for aspiration.  PT OT eval and ambulation.  Essential hypertension:Fairly stable on amlodipine, lisinopril  Type 2 diabetes mellitus with poorly controlled A1c 9.1 with hyperglycemia.  On insulin Semglee 36 units twice daily and SSI.  Blood sugar control Recent Labs  Lab 07/05/21 1154 07/05/21 1733 07/06/21 0028 07/06/21 0437 07/06/21 0704  GLUCAP 160* 194* 124* 109* 119*   Vaginal bleeding postmenopausal now a transvaginal ultrasound, left ovarian cyst noted seen by gynecology advised outpatient follow-up.  Patient attributes bleeding to steroid injection that happens each time she gets injection for her back pain  Dysphagia seen by speech on dysphagia 3 diet.  Physical deconditioning/debility: Continue PT OT  has suggested inpatient rehab TOC on consult.  [facility] is outside of the network  Mild to moderate MR outpatient follow-up  Urinary retention on bethanechol, on Foley we will discontinue Foley today  Class III Obesity:Patient's Body mass index is 46.36 kg/m. : Will benefit with PCP follow-up, weight loss  healthy lifestyle and outpatient sleep evaluation.  DVT prophylaxis: heparin injection 5,000 Units Start: 07/05/21 0600 Place and maintain sequential compression device Start: 07/04/21 0850 SCDs Start: 06/30/21 2231 Code Status:   Code Status: Full Code Family Communication: plan of care discussed with patient and her husband at bedside. Status is: Inpatient Remains inpatient appropriate because: For ongoing management of deconditioning debility Disposition: Currently not  medically stable for discharge. Anticipated Disposition: CIR  Objective: Vitals last 24 hrs: Vitals:   07/06/21 0039 07/06/21 0433 07/06/21 0911 07/06/21 0921  BP: (!) 143/71 (!) 128/53  (!) 174/76  Pulse: (!) 102 87    Resp: 19     Temp: 98.2 F (36.8 C) 98.1 F (36.7 C)    TempSrc: Axillary Oral    SpO2: 99% 98% 99%   Weight:      Height:       Weight change:   Intake/Output Summary (Last 24 hours) at 07/06/2021 1046 Last data filed at 07/06/2021 0500 Gross per 24 hour  Intake 149.99 ml  Output 5275 ml  Net -5125.01 ml   Net IO Since Admission: 1,018.82 mL [07/06/21 1046]   Physical Examination: General exam: Aa0x3, obese,weak,older than stated age. HEENT:Oral mucosa moist, Ear/Nose WNL grossly,dentition normal. Respiratory system: B/l diminished BS, no use of accessory  muscle, non tender. Cardiovascular system: S1 & S2 +,No JVD. Gastrointestinal system: Abdomen soft, NT,ND, BS+. Nervous System:Alert, awake, moving extremities. Extremities: edema none, distal peripheral pulses palpable.  Skin: No rashes, no icterus. MSK: Normal muscle bulk, tone, power.  Medications reviewed:   Scheduled Meds:  amLODipine  10 mg Oral Daily   aspirin  81 mg Oral Daily   bethanechol  10 mg Oral TID   budesonide  1 mg Nebulization BID   Chlorhexidine Gluconate Cloth  6 each Topical Daily   ezetimibe  10 mg Oral Daily   gabapentin  600 mg Oral TID   heparin  5,000 Units Subcutaneous Q8H   insulin aspart  0-20 Units Subcutaneous Q4H   insulin glargine-yfgn  36 Units Subcutaneous BID   lisinopril  40 mg Oral Daily   LORazepam  2 mg Intravenous Once   QUEtiapine  50 mg Oral BID   sodium chloride flush  10-40 mL Intracatheter Q12H   umeclidinium-vilanterol  1 puff Inhalation Daily   Continuous Infusions:  sodium chloride 5 mL/hr at 07/05/21 1100   feeding supplement (GLUCERNA 1.5 CAL) Stopped (07/05/21 1215)   lacosamide (VIMPAT) IV 100 mg (07/06/21 0008)   levETIRAcetam 1,000 mg (07/06/21 [id number])   Diet Order             DIET DYS 3 Room service appropriate? Yes; Fluid consistency: Thin  Diet effective now                   Nutrition Problem: Inadequate oral intake Etiology: inability to eat Signs/Symptoms: NPO status Interventions: Tube feeding, Prostat  Weight change:   Wt Readings from Last 3 Encounters:  07/05/21 111.3 kg  06/28/21 104.8 kg  07/17/20 104.9 kg     Consultants:see note  Procedures:see note Antimicrobials:  11/6 Vancomycin >> 11/10 11/6 Ampicillin >>11/13 11/6 Ceftriaxone >>11/14 11/6 Acyclovir >>11/9 11/6 Cefepime >> x 1 dose 11/16 unasyn -11/20 Culture/Microbiology  11/6>> CSF- mild pleocytosis. Culture- no growth at 4 days. VDRL- negative.  11/6>> blood cultures- negative (no growth @4d ) 11/6>> MRSA PCR - negative  11/6>> HSV1/2- negative  11/6>> Crypotococcal Ag - negative      Component Value Date/Time   SDES  06/30/2021 1715    TRACHEAL ASPIRATE Performed at [facility], [address] [place], [place] [address]    [facility]  06/30/2021 1715    NONE Performed at [facility], [address], [place], [place] [address]    CULT  06/30/2021 1715    FEW Normal respiratory flora-no Staph aureus or Pseudomonas seen Performed at [facility], [place] [address], [place], [place] [address]    REPTSTATUS 07/03/2021 FINAL 06/30/2021 1715    Other culture-see note  Unresulted Labs (From admission, onward)     Start     Ordered   07/03/21 0911  Antinuclear Antibodies, IFA  Once,   R       Question:  Specimen collection method  Answer:  Lab=Lab collect   07/03/21 0910   07/02/21 1239  Protein C, total  (Hypercoagulable Panel, Comprehensive (PNL))  Once,   R       Question:  Specimen collection method  Answer:  Lab=Lab collect   07/02/21 1239   07/02/21 1239  Factor 5 leiden  (Hypercoagulable Panel, Comprehensive (PNL))  Once,   R       Question:  Specimen collection method  Answer:  Lab=Lab collect   07/02/21 1239   07/02/21 1239  Prothrombin gene  mutation  (Hypercoagulable Panel, Comprehensive (PNL))  Once,   R       Question:  Specimen collection method  Answer:  Lab=Lab collect   07/02/21 1239   07/02/21 1239  Cardiolipin antibodies, IgG, IgM, IgA  (Hypercoagulable Panel, Comprehensive (PNL))  Once,   R       Question:  Specimen collection method  Answer:  Lab=Lab collect   07/02/21 1239   07/02/21 1239  Phosphatidylserine antibodies  Once,   R       Question:  Specimen collection method  Answer:  Lab=Lab collect   07/02/21 1239   06/30/21 2240  Culture, Respiratory w Gram Stain  Once,   R        06/30/21 2239          Data Reviewed: I have personally reviewed following labs and imaging studies CBC: Recent Labs  Lab 06/30/21 2312 06/30/21 2323 07/01/21 0330 07/02/21 0355 07/03/21 0342 07/06/21 0159  WBC 12.3*  --  13.6* 9.6 11.6* 11.9*  NEUTROABS  --   --   --  8.0* 9.1*  --   HGB 10.5* 10.2* 10.3* 8.9* 9.4* 9.3*  HCT 37.3 30.0* 36.0 31.0* 32.4* 31.7*  MCV 85.9  --  84.9 84.7 82.4 82.1  PLT 420*  --  487* 407* 483* [id number]*   Basic Metabolic Panel: Recent Labs   Lab 06/30/21 1551 06/30/21 2312 06/30/21 2323 07/01/21 0330 07/02/21 0355 07/03/21 0342 07/06/21 0159  NA 139  --  144 141 140 141 142  K 4.1  --  3.8 4.0 4.4 3.6 3.6  CL 105  --   --  104 108 103 104  CO2 24  --   --  27 25 28 28   GLUCOSE 274*  --   --  114* 260* 223* 155*  BUN 10  --   --  10 17 9 9   CREATININE 1.25* 0.91  --  0.84 0.67 0.53 0.64  CALCIUM 9.0  --   --  8.8* 8.7* 8.8* 8.8*  MG  --   --   --  2.5* 2.5*  --   --   PHOS  --   --   --  3.5 3.0  --   --    GFR: Estimated Creatinine Clearance: 88.6 mL/min (by C-G formula based on SCr of 0.64 mg/dL). Liver Function Tests: No results for input(s): AST, ALT, ALKPHOS, BILITOT, PROT, ALBUMIN in the last 168 hours. No results for input(s): LIPASE, AMYLASE in the last 168 hours. No results for input(s): AMMONIA in the last 168 hours. Coagulation Profile: No results for input(s): INR, PROTIME in the last 168 hours. Cardiac Enzymes: No results for input(s): CKTOTAL, CKMB, CKMBINDEX, TROPONINI in the last 168 hours. BNP (last 3 results) No results for input(s): PROBNP in the last 8760 hours. HbA1C: No results for input(s): HGBA1C in the last 72 hours. CBG: Recent Labs  Lab 07/05/21 1154 07/05/21 1733 07/06/21 0028 07/06/21 0437 07/06/21 0704  GLUCAP 160* 194* 124* 109* 119*   Lipid Profile: No results for input(s): CHOL, HDL, LDLCALC, TRIG, CHOLHDL, LDLDIRECT in the last 72 hours. Thyroid Function Tests: No results for input(s): TSH, T4TOTAL, FREET4, T3FREE, THYROIDAB in the last 72 hours. Anemia Panel: No results for input(s): VITAMINB12, FOLATE, FERRITIN, TIBC, IRON, RETICCTPCT in the last 72 hours. Sepsis Labs: No results for input(s): PROCALCITON, LATICACIDVEN in the last 168 hours.  Recent Results (from the past 240 hour(s))  Culture, Respiratory w Gram Stain  Status: None   Collection Time: 06/30/21  5:15 PM   Specimen: Tracheal Aspirate; Respiratory  Result Value Ref Range Status   Specimen  Description   Final    TRACHEAL ASPIRATE Performed at [facility], [address], [place], [place] [address]    Special Requests   Final    NONE Performed at [facility], [address] [place], [place] [address]    Gram Stain   Final    FEW SQUAMOUS EPITHELIAL CELLS PRESENT MODERATE WBC PRESENT, PREDOMINANTLY MONONUCLEAR MODERATE GRAM POSITIVE COCCI FEW GRAM NEGATIVE RODS    Culture   Final    FEW Normal respiratory flora-no Staph aureus or Pseudomonas seen Performed at [facility], [address] [place], [place] [address]    Report Status 07/03/2021 FINAL  Final     Radiology Studies: No results found.   LOS: 6 days   Lanae Boast, MD [facility]  07/06/2021, 10:46 AM

## 2021-07-06 NOTE — TOC Initial Note (Addendum)
Transition of Care [facility]) - Initial/Assessment Note    Patient Details  Name: Lauren Velazquez MRN: [id number] Date of Birth: May 15, 1961  Transition of [facility]) CM/SW Contact:    Pollie Friar, RN Phone Number: 07/06/2021, 11:53 AM  Clinical Narrative:                 Patient is from home with her spouse. She has oxygen at home through [place] at [place]. She denies any other DME.  Pt denies issues with home medications. Pt is the main driver for the home as her spouse has dementia.  Recommendations are for CIR. Pt's insurance is not in network with [facility]. CM has met with the patient and she is interested in either [facility] or [place] for [employer]. CM has called and left voicemails for both inpatient rehabs.  TOC following.  1400: Received call back from [facility]. CM has faxed them the referral per request: [phone] No response for [facility] yet.   Expected Discharge Plan: IP Rehab Facility Barriers to Discharge: Continued Medical Work up   Patient Goals and CMS Choice   CMS Medicare.gov Compare Post Acute Care list provided to:: Patient Choice offered to / list presented to : Patient  Expected Discharge Plan and Services Expected Discharge Plan: [place]   Discharge Planning Services: CM Consult Post Acute Care Choice: IP Rehab Living arrangements for the past 2 months: Single Family Home                                      Prior Living Arrangements/Services Living arrangements for the past 2 months: Single Family Home Lives with:: Spouse Patient language and need for interpreter reviewed:: Yes Do you feel safe going back to the place where you live?: Yes        Care giver support system in place?: No (comment)   Criminal Activity/Legal Involvement Pertinent to Current Situation/Hospitalization: No - Comment as needed  Activities of Daily Living Home Assistive Devices/Equipment: None ADL Screening (condition at time of admission) Patient's  cognitive ability adequate to safely complete daily activities?: No Is the patient deaf or have difficulty hearing?: No Does the patient have difficulty seeing, even when wearing glasses/contacts?: No Does the patient have difficulty concentrating, remembering, or making decisions?: No Patient able to express need for assistance with ADLs?: No Does the patient have difficulty dressing or bathing?: Yes Independently performs ADLs?: Yes (appropriate for developmental age) Does the patient have difficulty walking or climbing stairs?: Yes Weakness of Legs: Both Weakness of Arms/Hands: Both  Permission Sought/Granted                  Emotional Assessment Appearance:: Appears stated age Attitude/Demeanor/Rapport: Engaged Affect (typically observed): Accepting Orientation: : Oriented to Self, Oriented to Place, Oriented to  Time, Oriented to Situation   Psych Involvement: No (comment)  Admission diagnosis:  Seizure [facility]) [R56.9] Patient Active Problem List   Diagnosis Date Noted   Cerebrovascular accident (CVA) ([place])    Seizure ([place]) 06/30/2021   Respiratory insufficiency    Encephalopathy acute    Acute delirium 06/21/2021   Altered mental status 06/20/2021   Insulin dependent type 2 diabetes mellitus ([place]) 06/20/2021   Acute sepsis ([place])    Pneumonia due to COVID-19 virus 07/15/2020   Acute respiratory failure due to COVID-19 ([place]) 07/15/2020   Obesity, Class III, BMI 40-49.9 (morbid obesity) ([place])  07/15/2020   Chronic prescription opiate use 07/15/2020   Bacterial pneumonia 07/15/2020   Acute hypoxemic respiratory failure due to COVID-19 [facility]) 07/15/2020   Suspected COVID-19 virus infection 03/06/2020   Elevated troponin 03/06/2020   Acute respiratory failure with hypoxia ([place]) 03/05/2020   Diabetes mellitus type 2, uncomplicated ([place]) 51/88/4166   Hyperlipidemia 05/30/2014   Hypertension 05/30/2014   Obesity 05/30/2014   Vertigo 05/30/2014   Carpal tunnel syndrome on  both sides 12/27/2013   PCP:  Denton Lank, MD Pharmacy:   [place] - [address] [place] [address] Phone: [phone] Fax: [phone]  [id number] [place], [place] - [address] [place] [address] Phone: [phone] Fax: [phone]     Social Determinants of Health ([place]) Interventions    Readmission Risk Interventions No flowsheet data found.

## 2021-07-07 ENCOUNTER — Inpatient Hospital Stay (HOSPITAL_COMMUNITY): Payer: BLUE CROSS/BLUE SHIELD

## 2021-07-07 DIAGNOSIS — I824Z2 Acute embolism and thrombosis of unspecified deep veins of left distal lower extremity: Secondary | ICD-10-CM

## 2021-07-07 DIAGNOSIS — I6300 Cerebral infarction due to thrombosis of unspecified precerebral artery: Secondary | ICD-10-CM

## 2021-07-07 DIAGNOSIS — I82402 Acute embolism and thrombosis of unspecified deep veins of left lower extremity: Secondary | ICD-10-CM

## 2021-07-07 DIAGNOSIS — N939 Abnormal uterine and vaginal bleeding, unspecified: Secondary | ICD-10-CM

## 2021-07-07 LAB — GLUCOSE, CAPILLARY
Glucose-Capillary: 151 mg/dL — ABNORMAL HIGH (ref 70–99)
Glucose-Capillary: 153 mg/dL — ABNORMAL HIGH (ref 70–99)
Glucose-Capillary: 331 mg/dL — ABNORMAL HIGH (ref 70–99)
Glucose-Capillary: 199 mg/dL — ABNORMAL HIGH (ref 70–99)
Glucose-Capillary: 322 mg/dL — ABNORMAL HIGH (ref 70–99)

## 2021-07-07 MED ORDER — RIVAROXABAN 15 MG PO TABS
15.0000 mg | ORAL_TABLET | Freq: Two times a day (BID) | ORAL | Status: DC
  Administered 2021-07-07 – 2021-07-20 (×28): 15 mg via ORAL
  Filled 2021-07-07 (×28): qty 1

## 2021-07-07 MED ORDER — OXYCODONE HCL 5 MG PO TABS
5.0000 mg | ORAL_TABLET | Freq: Four times a day (QID) | ORAL | Status: DC | PRN
  Administered 2021-07-08 – 2021-07-10 (×8): 5 mg via ORAL
  Filled 2021-07-07 (×8): qty 1

## 2021-07-07 MED ORDER — INSULIN ASPART 100 UNIT/ML IJ SOLN
0.0000 [IU] | Freq: Every day | INTRAMUSCULAR | Status: DC
  Administered 2021-07-07: 4 [IU] via SUBCUTANEOUS
  Administered 2021-07-08 – 2021-07-09 (×2): 3 [IU] via SUBCUTANEOUS
  Administered 2021-07-10 – 2021-07-11 (×2): 2 [IU] via SUBCUTANEOUS
  Administered 2021-07-12: 22:00:00 3 [IU] via SUBCUTANEOUS
  Administered 2021-07-13 – 2021-07-14 (×2): 4 [IU] via SUBCUTANEOUS
  Administered 2021-07-15: 2 [IU] via SUBCUTANEOUS
  Administered 2021-07-16: 3 [IU] via SUBCUTANEOUS
  Administered 2021-07-17: 2 [IU] via SUBCUTANEOUS

## 2021-07-07 MED ORDER — LEVETIRACETAM 500 MG PO TABS
1000.0000 mg | ORAL_TABLET | Freq: Two times a day (BID) | ORAL | Status: DC
  Administered 2021-07-07 – 2021-07-20 (×26): 1000 mg via ORAL
  Filled 2021-07-07 (×17): qty 2
  Filled 2021-07-07: qty 4
  Filled 2021-07-07 (×10): qty 2

## 2021-07-07 MED ORDER — OXYCODONE-ACETAMINOPHEN 5-325 MG PO TABS
1.0000 | ORAL_TABLET | Freq: Four times a day (QID) | ORAL | Status: DC | PRN
  Administered 2021-07-07 – 2021-07-10 (×12): 1 via ORAL
  Filled 2021-07-07 (×12): qty 1

## 2021-07-07 MED ORDER — LACOSAMIDE 50 MG PO TABS
100.0000 mg | ORAL_TABLET | Freq: Two times a day (BID) | ORAL | Status: DC
  Administered 2021-07-07 – 2021-07-20 (×27): 100 mg via ORAL
  Filled 2021-07-07 (×26): qty 2

## 2021-07-07 MED ORDER — RIVAROXABAN 20 MG PO TABS: 20.0000 mg | ORAL_TABLET | Freq: Every day | ORAL | Status: DC

## 2021-07-07 MED ORDER — GLUCERNA SHAKE PO LIQD
237.0000 mL | Freq: Three times a day (TID) | ORAL | Status: DC
  Administered 2021-07-12 – 2021-07-16 (×5): 237 mL via ORAL

## 2021-07-07 MED ORDER — INSULIN ASPART 100 UNIT/ML IJ SOLN
0.0000 [IU] | Freq: Three times a day (TID) | INTRAMUSCULAR | Status: DC
  Administered 2021-07-07: 4 [IU] via SUBCUTANEOUS
  Administered 2021-07-08: 15 [IU] via SUBCUTANEOUS
  Administered 2021-07-08: 11 [IU] via SUBCUTANEOUS
  Administered 2021-07-09 (×2): 7 [IU] via SUBCUTANEOUS
  Administered 2021-07-09: 20 [IU] via SUBCUTANEOUS
  Administered 2021-07-10: 15 [IU] via SUBCUTANEOUS
  Administered 2021-07-10: 11 [IU] via SUBCUTANEOUS
  Administered 2021-07-10 – 2021-07-11 (×2): 15 [IU] via SUBCUTANEOUS
  Administered 2021-07-11 (×2): 7 [IU] via SUBCUTANEOUS
  Administered 2021-07-12: 07:00:00 11 [IU] via SUBCUTANEOUS
  Administered 2021-07-12: 18:00:00 20 [IU] via SUBCUTANEOUS
  Administered 2021-07-12: 11:00:00 11 [IU] via SUBCUTANEOUS
  Administered 2021-07-13: 7 [IU] via SUBCUTANEOUS
  Administered 2021-07-13: 15 [IU] via SUBCUTANEOUS
  Administered 2021-07-14: 11 [IU] via SUBCUTANEOUS
  Administered 2021-07-14 – 2021-07-15 (×3): 15 [IU] via SUBCUTANEOUS
  Administered 2021-07-15: 7 [IU] via SUBCUTANEOUS
  Administered 2021-07-15: 15 [IU] via SUBCUTANEOUS
  Administered 2021-07-16 (×2): 7 [IU] via SUBCUTANEOUS
  Administered 2021-07-16: 11 [IU] via SUBCUTANEOUS
  Administered 2021-07-17 (×2): 4 [IU] via SUBCUTANEOUS
  Administered 2021-07-17: 3 [IU] via SUBCUTANEOUS
  Administered 2021-07-18: 07:00:00 4 [IU] via SUBCUTANEOUS
  Administered 2021-07-18 – 2021-07-19 (×3): 3 [IU] via SUBCUTANEOUS
  Administered 2021-07-19: 11 [IU] via SUBCUTANEOUS
  Administered 2021-07-19: 4 [IU] via SUBCUTANEOUS
  Administered 2021-07-20: 3 [IU] via SUBCUTANEOUS
  Administered 2021-07-20: 4 [IU] via SUBCUTANEOUS

## 2021-07-07 NOTE — Progress Notes (Signed)
PROGRESS NOTE    Lauren Velazquez  [id number] DOB: 02-21-1961 DOA: 06/30/2021 PCP: Denton Lank, MD   Brief Narrative/Hospital Course: Lauren Velazquez, 60 y.o. female with PMH of diabetes hypertension chronic low back pain chronic right leg pain on chronic high-dose opiates, asthma, chronic hypoxic aspiratory failure on 3.5 L nasal cannula presented to [facility] with erratic behavior on 11/5-multiple complaints of chest pain shortness of breath, agitated bizarre behavior, trying to stick her finger down the throat to make her self vomit. Refer to previous ED notes in detail patient was placed on IVC, while in ED became febrile, septic with severely altered mental status concerning for meningitis.  Other differential included viral meningitis sturgeon syndrome opioid withdrawal.  Chest x-ray UA no obvious source of infection.  She had increasing agitation refractory to benzodiazepine then subsequently sedated and intubated and had LP done.  MRI 11/7-showed acute infarct.  Was successfully extubated 11/12. 11/15 transferred to [place] On 11/16 transferred to the floor intermittently drowsy throughout the day then had an episode of complete unresponsiveness with foaming and posturing, acute hypotension, was intubated emergently and transferred to ICU seen by neurology given Keppra and transferred to [facility] for continuous EEG 11/22 to [place]  Significant event 06/20/21- Admit to ICU  with suspected meningitis s/p mechanical intubation for airway protection in the setting of increased sedation needs and agitation. S/p LP @2 :37 AM. ID consult. Patient started empirically on vancomycin, ceftriaxone, ampicillin and acyclovir.  ID consult. 06/21/21- MRI w/out contrast shows acute infarcts (embolic vs. Hypotensive). Neuro, and psych consulted.  TTE showed normal LVEF, grade I diastolic dysfunction, no sig valvular abnormalities and no intracardiac clot.  rEEG showed moderate diffuse slowing with subtle superimposed  right focal slowing without epileptiform abnormalities 06/22/21-persistent agitation off of sedation.  Started Depakene for mood stabilization and cyproheptadine for potential serotonergic symptoms. 06/23/21: Remains on the vent and sedated. 06/24/21: Remains on the vent. Neuro eval recs central embolic suspected.  Start ASA 81mg  daily + plavix 75mg  daily x21 days f/b ASA 81mg  daily after that.  Consider TEE 06/25/21: Remains on the vent and sedation.  Status post TEE negative for thrombus or vegetation. 06/26/21: S/p extubation now IVC with sitter at the bedside 06/27/21: Stable on [place] @2L  with fluctuating mental status. Sitter at bedside 06/28/21: Restart home dose of oxycodone for concern of possible withdrawal. EEG per neuro r/o seizure activity. Patient stable, able to be transferred to step down care.  06/29/21: Transferred to Med/surg 06/30/21: Rapid response called for unresponsiveness and hypotension.  Upon arrival to ICU concern for seizure activity.  Intubated for airway protection.  Transferred to [facility] for continuous EEG, PCCM admit 11/17 no further seizures. More scattered punctate infarcts on MRI compared to 11/7  11/18 extubated  11/19 had 2 witnessed seizures, loaded with Keppra, on EEG monitoring    Subjective: Patient awake and alert.  Sitting on the chair eating her breakfast.  Denies any headaches.  No nausea vomiting.  Occasional vaginal spotting has been noted.  Requesting that the Foley catheter be removed.   Assessment & Plan:  Acute metabolic encephalopathy Unclear etiology could be multifactorial could be from medication related, mostly agitated.   Patient was placed on Seroquel 50 mg twice a day.  Mentation has improved.  Seems to be back to baseline now.  Continue to monitor closely.  No focal neurological deficits noted.    Chronic pain on opiates and Neurontin at home  Home medication list was reviewed.  It looks like she  takes Percocet 10/325 1 tablet every 5 hours  as needed for pain.  Also noted to be on Neurontin 900 mg 3 times a day.  There is also tramadol listed on her home medication list.   Complains of back pain and right leg pain which is chronic.  Recently received steroid injection to the back.  Requesting that her home medication be resumed.  She was started on Percocet 3 times a day as needed.  Will increase to 4 times a day as needed today.  Tramadol on hold due to seizures.   Gabapentin being continued at a lower dose of 600 mg 3 times a day.    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  Duplex shows left  leg with age indeterminate DVT on 07/02/20.  Neurology following.  Completed a stroke work-up echo shows EF 60 to 65%, TEE no shunt, LDL 133, HbA1c 8.2. On asa 81 mg, Zetia.  Not on statins due to intolerance.  Continue PT OT.  Seizure episode: Continue Keppra and Vimpat as per neurology.  Continue seizure precaution, no driving x6 months.  Left  leg with age indeterminate distal DVT on 07/02/20 Case was discussed with hematology (Dr. Benay Spice) by previous rounding physician.  They recommended repeating Doppler study and to initiate anticoagulation if positive.  Doppler study done this morning and reveals chronic DVT in the left leg.  Will benefit from 3 months of anticoagulation.  Will initiate rivaroxaban.  Patient does report some vaginal spotting.  We will need to monitor that closely.    Acute on chronic hypoxic respiratory failure extubated 11/18 OSA Chronic restrictive lung disease post COVID Possible aspiration event Reports using 3.5 L nocturnal oxygen at home which is her current setting.  Followed by [facility] pulmonary. Completed Unasyn for aspiration.  Respiratory status is stable.  Essential hypertension Occasional high blood pressure readings noted.  Currently on amlodipine and lisinopril.  May need to add additional agents.  Type 2 diabetes mellitus with poorly controlled A1c  9.1 with hyperglycemia.   At home she is on Lantus insulin 70 units twice a day along with the NovoLog sliding scale basis.  Currently on glargine insulin.  CBGs are reasonably well controlled.    Vaginal bleeding postmenopausal Underwent a transabdominal ultrasound.  No fibroids were noted.  Cystic lesion of the left ovary or adnexa was noted. GYN consultation in the outpatient setting was recommended.   We will need to see if she has worsening bleeding once placed on anticoagulation.    Dysphagia seen by speech on dysphagia 3 diet.  Physical deconditioning/debility: Continue PT OT has suggested inpatient rehab TOC on consult.  [facility] rehab is outside of the network  Mild to moderate MR outpatient follow-up  Urinary retention voiding trial today.  Patient is on bethanechol.  Class III Obesity: Estimated body mass index is 46.36 kg/m as calculated from the following:   Height as of this encounter: 5\' 1"  (1.549 m).   Weight as of this encounter: 111.3 kg.   DVT prophylaxis: Subcutaneous heparin Code Status:   Code Status: Full Code Family Communication: p discussed with patient.  Status is: Inpatient Remains inpatient appropriate because: For ongoing management of deconditioning debility Disposition: Currently not  medically stable for discharge. Anticipated Disposition: Inpatient rehabilitation  Objective: Vitals last 24 hrs: Vitals:   07/07/21 0315 07/07/21 0744 07/07/21 0746 07/07/21 0843  BP: (!) 165/78   (!) 175/77  Pulse: 82   86  Resp:  20  Temp: 97.6 F (36.4 C)   98.5 F (36.9 C)  TempSrc: Oral   Oral  SpO2: 100% 98% 98% 100%  Weight:      Height:       Weight change:   Intake/Output Summary (Last 24 hours) at 07/07/2021 1021 Last data filed at 07/07/2021 0556 Gross per 24 hour  Intake 1245 ml  Output 4200 ml  Net -2955 ml    Net IO Since Admission: -1,936.18 mL [07/07/21 1021]   Physical Examination:  General appearance: Awake alert.  In no  distress Resp: Clear to auscultation bilaterally.  Normal effort Cardio: S1-S2 is normal regular.  No S3-S4.  No rubs murmurs or bruit GI: Abdomen is soft.  Nontender nondistended.  Bowel sounds are present normal.  No masses organomegaly Extremities: No edema.  Full range of motion of lower extremities. Neurologic:  No focal neurological deficits.     Medications reviewed:  Scheduled Meds:  amLODipine  10 mg Oral Daily   aspirin  81 mg Oral Daily   bethanechol  10 mg Oral TID   budesonide  1 mg Nebulization BID   Chlorhexidine Gluconate Cloth  6 each Topical Daily   ezetimibe  10 mg Oral Daily   gabapentin  600 mg Oral TID   heparin  5,000 Units Subcutaneous Q8H   insulin aspart  0-20 Units Subcutaneous Q4H   insulin glargine-yfgn  36 Units Subcutaneous BID   lisinopril  40 mg Oral Daily   LORazepam  2 mg Intravenous Once   QUEtiapine  50 mg Oral BID   sodium chloride flush  10-40 mL Intracatheter Q12H   umeclidinium-vilanterol  1 puff Inhalation Daily   Continuous Infusions:  sodium chloride 5 mL/hr at 07/05/21 1100   feeding supplement (GLUCERNA 1.5 CAL) Stopped (07/05/21 1215)   lacosamide (VIMPAT) IV 100 mg (07/06/21 2115)   levETIRAcetam 1,000 mg (07/07/21 0919)   Diet Order             DIET DYS 3 Room service appropriate? Yes; Fluid consistency: Thin  Diet effective now                   Nutrition Problem: Inadequate oral intake Etiology: inability to eat Signs/Symptoms: NPO status Interventions: Tube feeding, Prostat  Weight change:   Wt Readings from Last 3 Encounters:  07/05/21 111.3 kg  06/28/21 104.8 kg  07/17/20 104.9 kg     Consultants: Seen by neurology and critical care medicine  Procedures:see note Antimicrobials:  11/6 Vancomycin >> 11/10 11/6 Ampicillin >>11/13 11/6 Ceftriaxone >>11/14 11/6 Acyclovir >>11/9 11/6 Cefepime >> x 1 dose 11/16 unasyn -11/20  Culture/Microbiology  11/6>> CSF- mild pleocytosis. Culture- no growth at 4  days. VDRL- negative.  11/6>> blood cultures- negative (no growth @4d ) 11/6>> MRSA PCR - negative  11/6>> HSV1/2- negative  11/6>> Crypotococcal Ag - negative      Component Value Date/Time   SDES  06/30/2021 1715    TRACHEAL ASPIRATE Performed at [facility], [address] [place], [place] [address]    [facility]  06/30/2021 1715    NONE Performed at [facility], [address], [place], [place] [address]    CULT  06/30/2021 1715    FEW Normal respiratory flora-no Staph aureus or Pseudomonas seen Performed at [facility], [place] [address], [place], [place] [address]    REPTSTATUS 07/03/2021 FINAL 06/30/2021 1715    Other culture-see note  Unresulted Labs (From admission, onward)  Start     Ordered   07/02/21 1239  Factor 5 leiden  (Hypercoagulable Panel, Comprehensive (PNL))  Once,   R       Question:  Specimen collection method  Answer:  Lab=Lab collect   07/02/21 1239   07/02/21 1239  Prothrombin gene mutation  (Hypercoagulable Panel, Comprehensive (PNL))  Once,   R       Question:  Specimen collection method  Answer:  Lab=Lab collect   07/02/21 1239   07/02/21 1239  Phosphatidylserine antibodies  Once,   R       Question:  Specimen collection method  Answer:  Lab=Lab collect   07/02/21 1239          Data Reviewed: I have personally reviewed following labs and imaging studies CBC: Recent Labs  Lab 06/30/21 2312 06/30/21 2323 07/01/21 0330 07/02/21 0355 07/03/21 0342 07/06/21 0159  WBC 12.3*  --  13.6* 9.6 11.6* 11.9*  NEUTROABS  --   --   --  8.0* 9.1*  --   HGB 10.5* 10.2* 10.3* 8.9* 9.4* 9.3*  HCT 37.3 30.0* 36.0 31.0* 32.4* 31.7*  MCV 85.9  --  84.9 84.7 82.4 82.1  PLT 420*  --  487* 407* 483* 429*    Basic Metabolic Panel: Recent Labs  Lab 06/30/21 1551 06/30/21 2312 06/30/21 2323 07/01/21 0330 07/02/21 0355 07/03/21 0342 07/06/21 0159  NA 139  --  144 141 140 141 142  K 4.1  --  3.8 4.0 4.4 3.6 3.6  CL 105   --   --  104 108 103 104  CO2 24  --   --  27 25 28 28   GLUCOSE 274*  --   --  114* 260* 223* 155*  BUN 10  --   --  10 17 9 9   CREATININE 1.25* 0.91  --  0.84 0.67 0.53 0.64  CALCIUM 9.0  --   --  8.8* 8.7* 8.8* 8.8*  MG  --   --   --  2.5* 2.5*  --   --   PHOS  --   --   --  3.5 3.0  --   --     GFR: Estimated Creatinine Clearance: 88.6 mL/min (by C-G formula based on SCr of 0.64 mg/dL).  CBG: Recent Labs  Lab 07/06/21 1627 07/06/21 2009 07/06/21 2022 07/07/21 0308 07/07/21 0813  GLUCAP 178* 185* 193* 151* 153*      Recent Results (from the past 240 hour(s))  Culture, Respiratory w Gram Stain     Status: None   Collection Time: 06/30/21  5:15 PM   Specimen: Tracheal Aspirate; Respiratory  Result Value Ref Range Status   Specimen Description   Final    TRACHEAL ASPIRATE Performed at [facility], [address] [place], [place] [address]    Special Requests   Final    NONE Performed at [facility], [place] [place], [place] [address]    Gram Stain   Final    FEW SQUAMOUS EPITHELIAL CELLS PRESENT MODERATE WBC PRESENT, PREDOMINANTLY MONONUCLEAR MODERATE GRAM POSITIVE COCCI FEW GRAM NEGATIVE RODS    Culture   Final    FEW Normal respiratory flora-no Staph aureus or Pseudomonas seen Performed at [facility], [place] [address] [place], [place] [address]    Report Status 07/03/2021 FINAL  Final      Radiology Studies: VAS [place] LOWER EXTREMITY VENOUS (DVT)  Result Date: 07/07/2021  Lower Venous DVT Study Patient  Name:  [facility] Zelenak  Date of Exam:   07/07/2021 Medical Rec #: [id number]       Accession #:    [id number] Date of Birth: February 10, 1961       Patient Gender: F Patient Age:   7 years Exam Location:  [facility] Procedure:      VAS [place] LOWER EXTREMITY VENOUS (DVT) Referring Phys: Victor --------------------------------------------------------------------------------  Indications: F/u ptv dvt 07/02/21.  Comparison  Study: 07/02/21 prior Performing Technologist: Archie Patten RVS  Examination Guidelines: A complete evaluation includes B-mode imaging, spectral Doppler, color Doppler, and power Doppler as needed of all accessible portions of each vessel. Bilateral testing is considered an integral part of a complete examination. Limited examinations for reoccurring indications may be performed as noted. The reflux portion of the exam is performed with the patient in reverse Trendelenburg.  +-----+---------------+---------+-----------+----------+--------------+ RIGHTCompressibilityPhasicitySpontaneityPropertiesThrombus Aging +-----+---------------+---------+-----------+----------+--------------+ CFV  Full           Yes      Yes                                 +-----+---------------+---------+-----------+----------+--------------+   +---------+---------------+---------+-----------+----------+-----------------+ LEFT     CompressibilityPhasicitySpontaneityPropertiesThrombus Aging    +---------+---------------+---------+-----------+----------+-----------------+ CFV      Full           Yes      Yes                                    +---------+---------------+---------+-----------+----------+-----------------+ SFJ      Full                                                           +---------+---------------+---------+-----------+----------+-----------------+ FV Prox  Full                                                           +---------+---------------+---------+-----------+----------+-----------------+ FV Mid   Full                                                           +---------+---------------+---------+-----------+----------+-----------------+ FV DistalFull                                                           +---------+---------------+---------+-----------+----------+-----------------+ PFV      Full                                                            +---------+---------------+---------+-----------+----------+-----------------+ POP  Full           Yes      Yes                                    +---------+---------------+---------+-----------+----------+-----------------+ PTV      None                                         Chronic           +---------+---------------+---------+-----------+----------+-----------------+ PERO                                                  Age Indeterminate +---------+---------------+---------+-----------+----------+-----------------+     Summary: RIGHT: - No evidence of common femoral vein obstruction.  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 - No cystic structure found in the popliteal fossa.  *See table(s) above for measurements and observations.    Preliminary      LOS: 7 days   Osvaldo Shipper, MD [facility]  07/07/2021, 10:21 AM

## 2021-07-07 NOTE — Plan of Care (Signed)
  Problem: Safety: Goal: Non-violent Restraint(s) Outcome: Progressing   Problem: Education: Goal: Knowledge of General Education information will improve Description: Including pain rating scale, medication(s)/side effects and non-pharmacologic comfort measures Outcome: Progressing   Problem: Health Behavior/Discharge Planning: Goal: Ability to manage health-related needs will improve Outcome: Progressing   Problem: Clinical Measurements: Goal: Ability to maintain clinical measurements within normal limits will improve Outcome: Progressing Goal: Will remain free from infection Outcome: Progressing Goal: Diagnostic test results will improve Outcome: Progressing Goal: Cardiovascular complication will be avoided Outcome: Progressing   Problem: Pain Managment: Goal: General experience of comfort will improve Outcome: Progressing   Problem: Safety: Goal: Ability to remain free from injury will improve Outcome: Progressing   Problem: Elimination: Goal: Will not experience complications related to bowel motility Outcome: Progressing Goal: Will not experience complications related to urinary retention Outcome: Progressing

## 2021-07-07 NOTE — Progress Notes (Signed)
ANTICOAGULATION CONSULT NOTE - Initial Consult  Pharmacy Consult for Xarelto Indication: DVT  Allergies  Allergen Reactions   Atorvastatin Swelling    Patient Measurements: Height: 5\' 1"  (154.9 cm) Weight: 111.3 kg (245 lb 6 oz) IBW/kg (Calculated) : 47.8  Vital Signs: Temp: 98.5 F (36.9 C) (11/23 0843) Temp Source: Oral (11/23 0843) BP: 175/77 (11/23 0843) Pulse Rate: 86 (11/23 0843)  Labs: Recent Labs    07/06/21 0159  HGB 9.3*  HCT 31.7*  PLT 429*  CREATININE 0.64    Estimated Creatinine Clearance: 88.6 mL/min (by C-G formula based on SCr of 0.64 mg/dL).   Medical History: Past Medical History:  Diagnosis Date   Anemia    not currently under treatment   Anxiety    Asthma    during allergy season   Diabetes mellitus without complication (HCC)    Hypertension    Vertigo     Medications:  Medications Prior to Admission  Medication Sig Dispense Refill Last Dose   albuterol (VENTOLIN HFA) 108 (90 Base) MCG/ACT inhaler Inhale 2 puffs into the lungs every 6 (six) hours as needed.   Past Week   Fluticasone-Umeclidin-Vilant (TRELEGY ELLIPTA) 100-62.5-25 MCG/ACT AEPB Inhale 1 puff into the lungs daily.   Past Week   gabapentin (NEURONTIN) 300 MG capsule Take 900 mg by mouth 3 (three) times daily.   Past Week   insulin glargine (LANTUS) 100 UNIT/ML Solostar Pen Inject 70 Units into the skin 2 (two) times daily.   Past Week   NOVOLOG FLEXPEN 100 UNIT/ML FlexPen Inject 20 Units into the skin 3 (three) times daily.   Past Week   oxyCODONE-acetaminophen (PERCOCET) 10-325 MG tablet Take 1 tablet by mouth every 5 (five) hours as needed for pain.   Past Week   traMADol (ULTRAM) 50 MG tablet Take 50 mg by mouth every 8 (eight) hours as needed.       Assessment: Lauren Velazquez is a 60 yo female admitted 11/6 for acute infarcts. 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 Not on anticoagulation prior to admission. Last dose of  heparin given 11/22 @ 1716, refused morning dose.   Goal of Therapy:  Anticoagulation Monitor platelets by anticoagulation protocol: Yes   Plan:  Xarelto 15 mg PO BID x 21 days, followed by 20 mg daily with meal. Monitor CBC and for signs and symptoms of bleeding. Discontinue heparin and aspirin while on Xarelto F/u resume aspirin if/when Xarelto discontinued   Thank you for allowing 12/22 to participate in this patients care. [place], PharmD 07/07/2021 11:47 AM  **Pharmacist phone directory can be found on [email or web address] listed under [facility]**

## 2021-07-07 NOTE — Progress Notes (Signed)
STROKE TEAM PROGRESS NOTE   SUBJECTIVE (INTERVAL HISTORY) No acute events. Sitting up in bed. Continuing to improve with alertness. She is holding conversation and asking appropriate questions today. We discussed her stroke diagnosis, prognosis and ongoing plan of care.  TCD bubble study done at the bedside was negative for PFO.  Patient has been started on Xarelto for lower extremity DVT of indeterminate etiology. OBJECTIVE Vitals:   07/07/21 0744 07/07/21 0746 07/07/21 0843 07/07/21 1204  BP:   (!) 175/77 131/72  Pulse:   86 95  Resp:   20 20  Temp:   98.5 F (36.9 C) 98.1 F (36.7 C)  TempSrc:   Oral Oral  SpO2: 98% 98% 100% 99%  Weight:      Height:        CBC:  Recent Labs  Lab 07/02/21 0355 07/03/21 0342 07/06/21 0159  WBC 9.6 11.6* 11.9*  NEUTROABS 8.0* 9.1*  --   HGB 8.9* 9.4* 9.3*  HCT 31.0* 32.4* 31.7*  MCV 84.7 82.4 82.1  PLT 407* 483* 429*    Basic Metabolic Panel:  Recent Labs  Lab 07/01/21 0330 07/02/21 0355 07/03/21 0342 07/06/21 0159  NA 141 140 141 142  K 4.0 4.4 3.6 3.6  CL 104 108 103 104  CO2 27 25 28 28   GLUCOSE 114* 260* 223* 155*  BUN 10 17 9 9   CREATININE 0.84 0.67 0.53 0.64  CALCIUM 8.8* 8.7* 8.8* 8.8*  MG 2.5* 2.5*  --   --   PHOS 3.5 3.0  --   --     Lipid Panel: No results for input(s): CHOL, TRIG, HDL, CHOLHDL, VLDL, LDLCALC in the last 168 hours. HgbA1c:  Lab Results  Component Value Date   HGBA1C 8.2 (H) 06/20/2021   Urine Drug Screen:     Component Value Date/Time   LABOPIA NONE DETECTED 06/19/2021 2257   COCAINSCRNUR NONE DETECTED 06/19/2021 2257   LABBENZ NONE DETECTED 06/19/2021 2257   AMPHETMU NONE DETECTED 06/19/2021 2257   THCU NONE DETECTED 06/19/2021 2257   LABBARB NONE DETECTED 06/19/2021 2257    Alcohol Level No results found for: [facility]  IMAGING  Results for orders placed or performed during the hospital encounter of 06/30/21  MR BRAIN WO CONTRAST   Narrative   CLINICAL DATA:  Mental status change,  unknown cause  EXAM: MRI HEAD WITHOUT CONTRAST  TECHNIQUE: Multiplanar, multiecho pulse sequences of the brain and surrounding structures were obtained without intravenous contrast.  COMPARISON:  06/21/2021.  FINDINGS: Brain: Scattered foci of restricted diffusion with ADC correlate, largely cortical, in both cerebral hemispheres, most of which are punctate, with larger areas of infarction right parietal lobe (series 5, image 85) and left occipital lobe (series 5, image 68). The largest areas are also associated with increased T2 signal and may be subacute. The vast majority of these are new compared to 06/21/2021, with only 1 seen on the prior exam (right inferior parietal lobe lobe, posterior to the right lateral ventricle). The bilateral frontal lobes appear least affected. No foci of restricted diffusion in the cerebellum.  No acute hemorrhage, mass, mass effect, or midline shift. No hydrocephalus or extra-axial collection.  Vascular: Normal flow voids.  Skull and upper cervical spine: Normal marrow signal.  Sinuses/Orbits: Mucosal thickening in the ethmoid air cells, maxillary sinuses, and sphenoid sinuses. The orbits are unremarkable.  Other: Fluid throughout the bilateral mastoid air cells.  IMPRESSION: Scattered acute and subacute infarcts in the bilateral cerebral hemispheres, largely the MCA and PCA  territories, the majority of which are new compared to 06/21/2021. The 2 largest areas of infarction, in the right parietal and left occipital lobes, are associated with increased T2 signal and are likely subacute. Given multiple vascular territories, embolic etiology is suspected, although a hypotensive episode could also produce this appearance.   Electronically Signed   By: Wiliam Ke M.D.   On: 07/01/2021 11:55   Results for orders placed or performed during the hospital encounter of 06/19/21  MR BRAIN W WO CONTRAST   Narrative   CLINICAL DATA:   61 year old female with unexplained altered mental status. Sepsis.  EXAM: MRI HEAD WITHOUT AND WITH CONTRAST  TECHNIQUE: Multiplanar, multiecho pulse sequences of the brain and surrounding structures were obtained without and with intravenous contrast.  CONTRAST:  75mL GADAVIST GADOBUTROL 1 MMOL/ML IV SOLN  COMPARISON:  Head CT 06/19/2021.  FINDINGS: Brain: Scattered small cortical and occasionally white matter areas of restricted diffusion in both cerebral hemispheres. Many are punctate. Bilateral PCA, left MCA territories are affected. Minimal associated T2 and FLAIR hyperintensity. But no deep gray matter nuclei, brainstem or cerebellar involvement.  Superimposed small chronic lacunar infarct of the left caudate nucleus. Mild patchy bilateral periatrial white matter T2 and FLAIR hyperintensity.  No acute or chronic intracranial hemorrhage. No midline shift, mass effect, evidence of mass lesion, ventriculomegaly, extra-axial collection. Cervicomedullary junction and pituitary are within normal limits.  No abnormal enhancement identified.  No dural thickening.  Vascular: Major intracranial vascular flow voids are preserved. The major dural venous sinuses are enhancing and appear to be patent.  Skull and upper cervical spine: Negative visible cervical spine. Visualized bone marrow signal is within normal limits.  Sinuses/Orbits: Negative orbits. Trace paranasal sinus mucosal thickening.  Other: Mastoids are clear. Visible internal auditory structures appear normal. Intubated. Small volume retained secretions in the nasopharynx.  IMPRESSION: 1. Scattered punctate acute infarcts in both cerebral hemispheres (bilateral PCA and left MCA territories). No associated hemorrhage or mass effect. Although the posterior fossa and anterior cerebral artery territories are spared, the pattern is suspicious for a recent Embolic event. Hypotensive episode might also produce  this appearance.  2. 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   Electronically Signed   By: Odessa Fleming M.D.   On: 06/21/2021 05:31   MR ANGIO HEAD WO CONTRAST   Narrative   CLINICAL DATA:  Initial evaluation for carotid stenosis screening.  EXAM: MRA NECK WITHOUT AND WITH CONTRAST  MRA HEAD WITHOUT CONTRAST  TECHNIQUE: Multiplanar and multiecho pulse sequences of the neck were obtained without and with intravenous contrast. Angiographic images of the neck were obtained using MRA technique without and with intravenous contrast; Angiographic images of the Circle of Willis were obtained using MRA technique without intravenous contrast.  CONTRAST:  80mL GADAVIST GADOBUTROL 1 MMOL/ML IV SOLN  COMPARISON:  Comparison made with prior brain MRI from 06/21/2021  FINDINGS: MRA NECK FINDINGS  AORTIC ARCH: Visualized aortic arch normal caliber with normal branch pattern. No hemodynamically significant stenosis about the origin of the great vessels.  RIGHT CAROTID SYSTEM: Right CCA patent from its origin to the bifurcation without stenosis. Atheromatous narrowing at the right carotid bulb/proximal right ICA with associated stenosis of up to 40-50% by NASCET criteria (series 1126, image 1). Right ICA otherwise patent distally without stenosis, evidence for dissection or occlusion.  LEFT CAROTID SYSTEM: Left CCA patent from its origin to the bifurcation without stenosis. No significant atheromatous  narrowing or irregularity about the left carotid bulb. Left ICA patent distally without stenosis, evidence for dissection or occlusion.  VERTEBRAL ARTERIES: Both vertebral arteries arise from the subclavian arteries. Vertebral arteries are largely codominant. Vertebral arteries mildly tortuous but are widely patent without stenosis evidence for dissection or occlusion.  MRA HEAD FINDINGS  ANTERIOR  CIRCULATION:  Distal cervical segments of the internal carotid arteries are widely patent with antegrade flow. Petrous, cavernous, and supraclinoid segments widely patent without stenosis or other abnormality. A1 segments patent bilaterally. Normal anterior communicating artery complex. Mild irregularity about the ACAs favored to be related to motion artifact. ACAs patent to their distal aspects without significant stenosis. No M1 stenosis or occlusion. Normal MCA bifurcations. Distal MCA branches well perfused and symmetric.  POSTERIOR CIRCULATION:  Both vertebral arteries patent to the vertebrobasilar junction without stenosis. Both PICA origins patent and normal. Basilar patent to its distal aspect without stenosis. Superior cerebellar arteries patent bilaterally. Both PCAs supplied via hypoplastic P1 segments and robust bilateral posterior communicating arteries. PCAs well perfused to their distal aspects without stenosis.  No intracranial aneurysm.  IMPRESSION: MRA HEAD IMPRESSION:  Negative intracranial MRA. No large vessel occlusion or hemodynamically significant stenosis.  MRA NECK IMPRESSION:  1. 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 2. Wide patency of the left carotid artery system within the neck. 3. Widely patent vertebral arteries within the neck.   Electronically Signed   By: Jeannine Boga M.D.   On: 06/25/2021 06:12   CT Head Wo Contrast   Narrative   CLINICAL DATA:  Altered mental status.  EXAM: CT HEAD WITHOUT CONTRAST  TECHNIQUE: Contiguous axial images were obtained from the base of the skull through the vertex without intravenous contrast.  COMPARISON:  None.  FINDINGS: Brain: Patient was scanned in the decubitus position. No intracranial hemorrhage, mass effect, or midline shift. No hydrocephalus. The basilar cisterns are patent. No evidence of territorial  infarct or acute ischemia. No extra-axial or intracranial fluid collection.  Vascular: No hyperdense vessel or unexpected calcification.  Skull: No fracture or focal lesion.  Sinuses/Orbits: No acute findings. Periapical lucencies are noted about multiple upper teeth.  Other: None.  IMPRESSION: No acute intracranial abnormality.   Electronically Signed   By: Keith Rake M.D.   On: 06/19/2021 22:51      PHYSICAL EXAM  General:  Patient is an obese female in no acute distress.   NEURO:  Mental Status: AA&Ox3  Speech/Language: speech is without dysarthria or aphasia.  Fluency, and comprehension intact.  Cranial Nerves:  II: PERRL. Visual fields full.  III, IV, VI: EOMI. Eyelids elevate symmetrically.  V: Sensation is intact to light touch and symmetrical to face.  VII: Smile is symmetrical.  VIII: hearing intact to voice. IX, X: Phonation is normal.  XII: tongue is midline without fasciculations. Motor: 5/5 strength to LUE, LLE and RUE.  Diminished strength in RLE but effort is poor and also limited due to pain. Sensation- Intact to light touch bilaterally.  Coordination: FTN intact bilaterally, No drift.  Gait- deferred     ASSESSMENT/PLAN Ms. Lauren Velazquez is a 60 y.o. female with history of DM2, HTN, Vertigo, HLD, Anxiety, back pain, and COVID-19 presenting with altered mental status. Patient was admitted on 06/20/2021 to [facility]- intubated and transferred to ICU, extubated 11/12. On 11/16 patient had an episode of complete unresponsiveness with foaming at the mouth and posturing with acute hypotension.  She was intubated emergently and transferred to the ICU.   Patient was evaluated by neurology and stat CT and CTA head and neck were negative for acute findings, she was loaded with 4g Keppra and transferred to [facility] hospital for continuous EEG. See timeline from [place] below.  Patient has had no seizure activity or other acute events  overnight. She has exhibited significant agitation and verbal aggression towards nursing staff but now appears calm and is amenable to going to rehab to regain mobility.  Awaiting some results of hypercoagulability workup.  Patient may need loop recorder for detection of atrial fibrillation.  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, DDx including  Hypercoagulability with metabolic syndrome and infection - morbid obesity, uncontrolled DM, HLD, fever  Endocarditis - fever and leukocytosis, but blood culture neg, TEE no endocarditis Paradoxical emboli - TEE no PFO and LE venous doppler no acute DVT Infectious or noninfectious vasculitis - MRI no T2 FLAIR change, LP no meningitis or encephalitis, no elevated protein, CTA  and MRA neg APL - ANA neg, hypercoag pending, no hx of miscarriage ANA full panel ordered 11/19- pending  MRI head 11/17 Scattered acute and subacute infarcts in the bilateral cerebral hemispheres, largely the MCA and PCA territories, the majority of which are new compared to 06/21/2021.  MRA head 11/11- negative MRA head. Atheromatous narrowing up to 40-50% involving the right carotid bulb/prox RICA. Patent L carotid arterial system, patient vertebral arteries  CTA head and neck 11/16 - negative 2D Echo  - EF 60-65% TEE - No evidence of intraarterial shunt, no atrial appendage thrombus detected Venous duplex- Left: findings consistent with age indeterminate deep vein thrombosis involving the left posterior tibial veins LDL 133 HgbA1c 8.2 UDS neg TCD with bubble study not done due to patient's lack of cooperation  hypercoagulable labs: PTT lupus anticoagulant 32.8, Beta-2 glycoprotein I IgG <9, beta-2 glycoprotein I IgA <9, beta-2 glycoprotein IgM <9, Protein C 139, Total protein S 119, functional protein S 84. Heparin subq for VTE prophylaxis No antithrombotic prior to admission, now on Xarelto for lower extremity DVT Ongoing aggressive stroke risk  factor management Therapy recommendations:  CIR Disposition: pending  Behavior disturbance  Reported bizarre behavior in ED Agitation in ICU but following commands and answer all questions appropriately Seizure episodes - on keppra and vipat LTM EEG discontinued 11/19- no epileptiform discharges noted EEG x 2 diffuse encephalopathy Seizure activity reported 11/19 overnight. EEG restarted 11/20 Discontinue LTM EEG 11/21 as no further seizure activity noted Keppra bolus and increased 1g BID CCM on board Agitation not evident today  Hypertension Hypertensive PO amlodipine and lisinopril PRN Hydralazine q4 for HTN BP goal < 180/105  Hyperlipidemia Home meds:  None LDL 133, goal < 70 Statin intolerance - due to swelling Zetia 10 Continue zetia at discharge  Diabetes type II HgbA1c 8.2, goal < 7.0 Uncontrolled SSI  CBG monitoring Close follow up with PCP recommended  Other Stroke Risk Factors Obesity, Body mass index is 46.36 kg/m., recommend weight loss, diet and exercise as appropriate  Coronary artery disease Obstructive sleep apnea, 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  LE DVT indeterminate duration. Started xarelto 07/06/21  Hospital day # 7  TIMELINE FROM [place]: 06/20/21- Admit to ICU  with suspected meningitis s/p mechanical intubation for airway protection in the setting of increased sedation needs and  agitation. S/p LP @2 :37 AM. ID consult. Patient started empirically on vancomycin, ceftriaxone, ampicillin and acyclovir.  ID consult. 06/21/21- MRI w/out contrast shows acute infarcts (embolic vs. Hypotensive). Neuro, and psych consulted.  TTE showed normal LVEF, grade I diastolic dysfunction, no sig valvular abnormalities and no intracardiac clot.  rEEG showed moderate diffuse slowing with subtle superimposed right focal slowing without epileptiform abnormalities 06/22/21-persistent agitation off of sedation.  Started Depakene for mood stabilization and cyproheptadine for potential serotonergic symptoms. 06/23/21: Remains on the vent and sedated. 06/24/21: Remains on the vent. Neuro eval recs central embolic suspected.  Start ASA 81mg  daily + plavix 75mg  daily x21 days f/b ASA 81mg  daily after that.  Consider TEE 06/25/21: Remains on the vent and sedation.  Status post TEE negative for thrombus or vegetation. 06/26/21: S/p extubation now IVC with sitter at the bedside 06/27/21: Stable on [place] @2L  with fluctuating mental status. Sitter at bedside 06/28/21: Restart home dose of oxycodone for concern of possible withdrawal. EEG per neuro r/o seizure activity. Patient stable, able to be transferred to step down care. PCCM to sign sign 06/29/21: Transferred to Med/surg 06/30/21: Rapid response called for unresponsiveness and hypotension.  Upon arrival to ICU concern for seizure activity.  Intubated for airway protection.  Transferred to [facility] for continuous EEG, PCCM admit  This patient was seen and evaluated with Dr. Leonie Man. He directed the plan of care.  Delila Bailey-Modzik, NP-C  [facility] See Amion for schedule and pager information 07/07/2021 1:18 PM I have personally obtained history,examined this patient, reviewed notes, independently viewed imaging studies, participated in medical decision making and plan of care.ROS completed by me personally and pertinent positives fully documented  I  have made any additions or clarifications directly to the above note. Agree with note above.  Patient has been found to have a DVT in the lower extremities and has been switched to Xarelto and recommend discontinue antiplatelet agents.  Transfer to rehab when bed available.  Consider 30-day heart monitor after discharge.  Follow-up with an outpatient stroke clinic in 2 months.  Discussed with patient and Dr. Maryland Pink.  Greater than 50% time during this 25-minute visit was spent in counseling and coordination of care and discussion patient care team and answering questions.  Stroke team will sign off.  Kindly call for questions  Antony Contras, MD Medical Director [place] Pager: [phone] 07/07/2021 3:05 PM

## 2021-07-07 NOTE — Progress Notes (Addendum)
Nutrition Follow-up  DOCUMENTATION CODES:   Morbid obesity  INTERVENTION:  -d/c Glucerna 1.5 orders (Cortrak removed already) -Glucerna Shake po TID, each supplement provides 220 kcal and 10 grams of protein -Recommend transition from SSI Q4H to SSI meal coverage  NUTRITION DIAGNOSIS:   Inadequate oral intake related to inability to eat as evidenced by NPO status.  Progressing as pt now on PO diet   GOAL:   Patient will meet greater than or equal to 90% of their needs  progressing  MONITOR:   PO intake, Supplement acceptance, Weight trends, Labs, I & O's  REASON FOR ASSESSMENT:   Ventilator    ASSESSMENT:   Pt with PMH of DM uses insulin, HTN, chronic back pain, HLD, COVID-19, and anxiety admitted 11/6 with AMS and meningitis.  11/6 admitted 11/12 extubated 11/16 seizure with aspiration, re-intubated tx to [facility] for continuous EEG monitoring 11/18 extubated; cortrak placed 11/19 had 2 witnessed seizures, loaded with Keppra, on EEG monitoring 11/21 diet advanced to dysphagia 3 w/ thin liquids s/p BSE; cortrak removed 11/22 tx to [facility]  Pt awake and alert and appears to be back to baseline per MD. Pt reports no issues with appetite. Denies N/V. Only one meal documented since diet advancement which happened to be breakfast this morning -- noted pt ate 100% of meal.   Pt had been receiving/tolerating Glucerna 1.5 @ 57ml/hr via Cortrak, but this appears to have been removed on 11/21 when pt's diet was advanced. Noted pt still on SSI Q4H coverage; sent secure chat to MD recommending transition to SSI meal coverage. RD to provide pt with oral nutrition supplements.   UOP: x24 hours I/O: - since admit  Medications: SSI Q4H, 36 units semglee BID  Labs reviewed. CBGs: 151-331 x24 hours  Diet Order:   Diet Order             DIET DYS 3 Room service appropriate? Yes; Fluid consistency: Thin  Diet effective now                   EDUCATION NEEDS:   Not  appropriate for education at this time  Skin:  Skin Assessment:  (Laceration: L labia)  Last BM:  11/21  Height:   Ht Readings from Last 1 Encounters:  06/30/21 5\' 1"  (1.549 m)    Weight:   Wt Readings from Last 1 Encounters:  07/05/21 111.3 kg    BMI:  Body mass index is 46.36 kg/m.  Estimated Nutritional Needs:   Kcal:  1800-2000  Protein:  100-115 grams  Fluid:  > 1.8 L/day     07/07/21., MS, RD, LDN (she/her/hers) RD pager number and weekend/on-call pager number located in Amion.

## 2021-07-07 NOTE — TOC Progression Note (Signed)
Transition of Care [facility]) - Progression Note    Patient Details  Name: Lauren Velazquez MRN: [id number] Date of Birth: 1961-06-06  Transition of Care [facility]) CM/SW Contact  Kermit Balo, RN Phone Number: 07/07/2021, 3:57 PM  Clinical Narrative:    [name] is reviewing her information but will not start insurance until Monday. Pt updated.    Expected Discharge Plan: IP Rehab Facility Barriers to Discharge: Continued Medical Work up  Expected Discharge Plan and Services Expected Discharge Plan: IP Rehab Facility   Discharge Planning Services: CM Consult Post Acute Care Choice: IP Rehab Living arrangements for the past 2 months: Single Family Home                                       Social Determinants of Health (SDOH) Interventions    Readmission Risk Interventions No flowsheet data found.

## 2021-07-07 NOTE — Progress Notes (Signed)
Occupational Therapy Treatment Patient Details Name: Lauren Velazquez MRN: [id number] DOB: 06/21/1961 Today's Date: 07/07/2021   History of present illness 60 yo female presenting to [facility] ED on 11/6 with AMS with agitation; suspect viral meningitis. Intubated 11/6-11/12 for airway protection after sedation. Transfered to [facility] on 11/15. EEG negative for ongoing seizures. Rapid response called for unresponsiveness and hypotension on 11/16. 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 Intubated 11/16-11/18. PMH including HTN, T2DM, vertigo, anxiety, chronic back pain, and HLD.   OT comments  Patient received in recliner and agreeable to OT treatment. Patient instructed in BUE strengthening exercises with yellow theraband for shoulder flexion and abduction and elbow flexion and extension. Patient was able to ambulate short distance to sink to perform grooming and became fatigue following. Patient returned to recliner. Acute OT to continue to follow.    Recommendations for follow up therapy are one component of a multi-disciplinary discharge planning process, led by the attending physician.  Recommendations may be updated based on patient status, additional functional criteria and insurance authorization.    Follow Up Recommendations  Acute inpatient rehab (3hours/day)    Assistance Recommended at Discharge Frequent or constant Supervision/Assistance  Equipment Recommendations  Other (comment)    Recommendations for Other Services      Precautions / Restrictions Precautions Precautions: Fall Restrictions Weight Bearing Restrictions: No       Mobility Bed Mobility               General bed mobility comments: Pt received and left in chair    Transfers Overall transfer level: Needs assistance Equipment used: Rolling walker (2 wheels) Transfers: Sit to/from Stand Sit to Stand: Mod assist            General transfer comment: transfer from sink back to recliner  with verbal cues for hand placment and safety     Balance Overall balance assessment: Needs assistance Sitting-balance support: Bilateral upper extremity supported;Feet supported Sitting balance-Leahy Scale: Fair     Standing balance support: Bilateral upper extremity supported;During functional activity;Reliant on assistive device for balance Standing balance-Leahy Scale: Poor Standing balance comment: reliant on external support                           ADL either performed or assessed with clinical judgement   ADL Overall ADL's : Needs assistance/impaired     Grooming: Wash/dry face;Brushing hair;Min guard;Standing Grooming Details (indicate cue type and reason): performed standing at sink with patient using sink for support                             Functional mobility during ADLs: Moderate assistance;Rolling walker (2 wheels) General ADL Comments: ambulated short distance to sink to perform grooming    Extremity/Trunk Assessment Upper Extremity Assessment RUE Coordination: decreased gross motor LUE Coordination: decreased gross motor            Vision       Perception     Praxis      Cognition Arousal/Alertness: Awake/alert Behavior During Therapy: Restless;Impulsive Overall Cognitive Status: Impaired/Different from baseline Area of Impairment: Attention;Memory;Following commands;Safety/judgement;Problem solving;Awareness                   Current Attention Level: Sustained Memory: Decreased short-term memory;Decreased recall of precautions Following Commands: Follows one step commands with increased time Safety/Judgement: Decreased  awareness of deficits;Decreased awareness of safety Awareness: Intellectual Problem Solving: Slow processing;Difficulty sequencing;Requires tactile cues;Requires verbal cues            Exercises Exercises: General Upper  Extremity General Exercises - Upper Extremity Shoulder Flexion: Strengthening;Both;10 reps;Seated;Theraband Theraband Level (Shoulder Flexion): Level 1 (Yellow) Shoulder ABduction: Strengthening;Both;10 reps;Seated;Theraband Theraband Level (Shoulder Abduction): Level 1 (Yellow) Elbow Flexion: Strengthening;Both;Seated;Theraband Theraband Level (Elbow Flexion): Level 1 (Yellow) Elbow Extension: Strengthening;Both;10 reps;Seated;Theraband Theraband Level (Elbow Extension): Level 1 (Yellow)   Shoulder Instructions       General Comments No c/o dizziness during session, remains on 3L O2 [place]    Pertinent Vitals/ Pain       Pain Assessment: 0-10 Pain Score: 9  (pt initially stated 10/10 pain but once sitting 9/10) Pain Location: Sciatica, RLE, catheter insertion site Pain Descriptors / Indicators: Aching;Discomfort;Tingling Pain Intervention(s): Monitored during session;Limited activity within patient's tolerance  Home Living                                          Prior Functioning/Environment              Frequency  Min 2X/week        Progress Toward Goals  OT Goals(current goals can now be found in the care plan section)  Progress towards OT goals: Progressing toward goals  Acute Rehab OT Goals Patient Stated Goal: none stated OT Goal Formulation: Patient unable to participate in goal setting Time For Goal Achievement: 07/16/21 Potential to Achieve Goals: Fair ADL Goals Pt Will Perform Grooming: with min assist;sitting Pt Will Perform Upper Body Dressing: with mod assist;sitting Pt Will Perform Lower Body Dressing: with min assist;with adaptive equipment;sit to/from stand Pt Will Transfer to Toilet: with mod assist;stand pivot transfer;bedside commode Additional ADL Goal #1: Pt will follow one step commands 75% of time during ADLs Additional ADL Goal #2: Pt will perform bed mobility with Mod A in preparation for ADLs  Plan Discharge plan  remains appropriate    Co-evaluation                 AM-PAC OT "6 Clicks" Daily Activity     Outcome Measure   Help from another person eating meals?: Total Help from another person taking care of personal grooming?: A Little Help from another person toileting, which includes using toliet, bedpan, or urinal?: A Lot Help from another person bathing (including washing, rinsing, drying)?: A Lot Help from another person to put on and taking off regular upper body clothing?: A Lot Help from another person to put on and taking off regular lower body clothing?: Total 6 Click Score: 11    End of Session Equipment Utilized During Treatment: Gait belt;Rolling walker (2 wheels);Oxygen  OT Visit Diagnosis: Unsteadiness on feet (R26.81);Muscle weakness (generalized) (M62.81)   Activity Tolerance Patient tolerated treatment well   Patient Left in chair;with call bell/phone within reach;with chair alarm set   Nurse Communication Mobility status        Time: 2330-0762 OT Time Calculation (min): 31 min  Charges: OT General Charges $OT Visit: 1 Visit OT Treatments $Self Care/Home Management : 8-22 mins $Therapeutic Exercise: 8-22 mins  Lauren Velazquez, OTA Acute Rehabilitation Services  Pager [phone] Office [phone]   Lauren Velazquez 07/07/2021, 1:02 PM

## 2021-07-07 NOTE — Progress Notes (Addendum)
Physical Therapy Treatment Patient Details Name: Lauren Velazquez MRN: [id number] DOB: [birth year] Today's Date: 07/07/2021   History of Present Illness 60 yo female presenting to [facility] ED on 11/6 with AMS with agitation; suspect viral meningitis. Intubated 11/6-11/12 for airway protection after sedation. Transfered to [facility] on 11/15. EEG negative for ongoing seizures. Rapid response called for unresponsiveness and hypotension on 11/16. 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 Intubated 11/16-11/18. PMH including HTN, T2DM, vertigo, anxiety, chronic back pain, and HLD.    PT Comments    Pt received upright in chair and agreeable to therapy session. Pt limited by fatigue in BLE this session, needing frequent rest breaks with household distance gait tasks. Pt also self limiting ambulatory distances due to c/o severe sciatic pain in RLE. Emphasis on activity pacing, safety with RW, and fall prevention. Pt remains impulsive and begins walking or attempting to sit before cued to do so. Continue to recommend AIR upon DC. Pt continues to benefit from PT services to progress toward functional mobility goals.      Recommendations for follow up therapy are one component of a multi-disciplinary discharge planning process, led by the attending physician.  Recommendations may be updated based on patient status, additional functional criteria and insurance authorization.  Follow Up Recommendations  Acute inpatient rehab (3hours/day)     Assistance Recommended at Discharge Frequent or constant Supervision/Assistance  Equipment Recommendations  Other (comment) (TBD)    Recommendations for Other Services       Precautions / Restrictions Precautions Precautions: Fall Restrictions Weight Bearing Restrictions: No     Mobility  Bed Mobility  General bed mobility comments: Pt received and left in chair    Transfers Overall  transfer level: Needs assistance Equipment used: Rolling walker (2 wheels) Transfers: Sit to/from Stand Sit to Stand: Min assist;+2 safety/equipment   General transfer comment: up to minA for STS with cues for hand placement with good carryover, steadying upon standing    Ambulation/Gait Ambulation/Gait assistance: +2 safety/equipment;Min assist (chair follow) Gait Distance (Feet): 10 Feet (10 ft, seated break, 18 ft, seated break, 28 ft) Assistive device: Rolling walker (2 wheels) Gait Pattern/deviations: Step-to pattern;Trunk flexed;Drifts right/left;Decreased stride length Gait velocity: decreased     General Gait Details: Dense cues for upright posture, increased step length; drifts to L side and bumps into objects and needs cues for correction, impulsive to sit when fatigued   Modified Rankin (Stroke Patients Only) Modified Rankin (Stroke Patients Only) Pre-Morbid Rankin Score: No symptoms Modified Rankin: Moderately severe disability     Balance Overall balance assessment: Needs assistance Sitting-balance support: Bilateral upper extremity supported;Feet supported Sitting balance-Leahy Scale: Fair    Standing balance support: Bilateral upper extremity supported;During functional activity;Reliant on assistive device for balance Standing balance-Leahy Scale: Poor Standing balance comment: reliant on external support      Cognition Arousal/Alertness: Awake/alert Behavior During Therapy: Restless;Impulsive Overall Cognitive Status: Impaired/Different from baseline Area of Impairment: Attention;Memory;Following commands;Safety/judgement;Problem solving;Awareness   Current Attention Level: Sustained Memory: Decreased short-term memory;Decreased recall of precautions Following Commands: Follows one step commands with increased time Safety/Judgement: Decreased awareness of deficits;Decreased awareness of safety Awareness: Intellectual Problem Solving: Slow  processing;Difficulty sequencing;Requires tactile cues;Requires verbal cues        Exercises Other Exercises Other Exercises: Ankle pumps x10    General Comments General comments (skin integrity, edema, etc.): No c/o dizziness during session, remains on 3L O2 [place]      Pertinent  Vitals/Pain Pain Assessment: 0-10 Pain Score: 9  (pt initially stated 10/10 pain but once sitting 9/10) Pain Location: Sciatica, RLE, catheter insertion site Pain Descriptors / Indicators: Aching;Discomfort;Tingling Pain Intervention(s): Monitored during session;Limited activity within patient's tolerance     PT Goals (current goals can now be found in the care plan section) Acute Rehab PT Goals Patient Stated Goal: did not state PT Goal Formulation: With patient Time For Goal Achievement: 07/16/21 Progress towards PT goals: Progressing toward goals    Frequency    Min 4X/week      PT Plan Current plan remains appropriate       AM-PAC PT "6 Clicks" Mobility   Outcome Measure  Help needed turning from your back to your side while in a flat bed without using bedrails?: A Little Help needed moving from lying on your back to sitting on the side of a flat bed without using bedrails?: A Little Help needed moving to and from a bed to a chair (including a wheelchair)?: A Lot (mod+ cues for all below) Help needed standing up from a chair using your arms (e.g., wheelchair or bedside chair)?: A Lot Help needed to walk in hospital room?: A Lot Help needed climbing 3-5 steps with a railing? : Total 6 Click Score: 13    End of Session Equipment Utilized During Treatment: Gait belt;Oxygen (3L) Activity Tolerance: Patient tolerated treatment well;Patient limited by fatigue Patient left: in chair;with call bell/phone within reach;Other (comment) (OT entering room upon therapist exit) Nurse Communication: Mobility status PT Visit Diagnosis: Unsteadiness on feet (R26.81);Muscle weakness (generalized)  (M62.81);Other abnormalities of gait and mobility (R26.89);Other symptoms and signs involving the nervous system (R29.898)     Time: 3664-4034 PT Time Calculation (min) (ACUTE ONLY): 21 min  Charges:  $Gait Training: 8-22 mins                     Harland German, Student PTA CI: Carly P., PTA  Carly M Poff 07/07/2021, 12:38 PM

## 2021-07-07 NOTE — Progress Notes (Signed)
Lower extremity venous has been completed.   Preliminary results in CV Proc.   Lauren Velazquez Micheil Klaus 07/07/2021 10:00 AM

## 2021-07-07 NOTE — Progress Notes (Signed)
TCD bubble has been completed.   Preliminary results in CV Proc.   Lauren Velazquez 07/07/2021 2:04 PM

## 2021-07-08 DIAGNOSIS — D649 Anemia, unspecified: Secondary | ICD-10-CM

## 2021-07-08 LAB — CBC
HCT: 35.8 % — ABNORMAL LOW (ref 36.0–46.0)
Hemoglobin: 10.2 g/dL — ABNORMAL LOW (ref 12.0–15.0)
MCH: 23.8 pg — ABNORMAL LOW (ref 26.0–34.0)
MCHC: 28.5 g/dL — ABNORMAL LOW (ref 30.0–36.0)
MCV: 83.4 fL (ref 80.0–100.0)
Platelets: 451 10*3/uL — ABNORMAL HIGH (ref 150–400)
RBC: 4.29 MIL/uL (ref 3.87–5.11)
RDW: 16.4 % — ABNORMAL HIGH (ref 11.5–15.5)
WBC: 9.6 10*3/uL (ref 4.0–10.5)
nRBC: 0 % (ref 0.0–0.2)

## 2021-07-08 LAB — BASIC METABOLIC PANEL
Anion gap: 8 (ref 5–15)
BUN: 5 mg/dL — ABNORMAL LOW (ref 6–20)
CO2: 26 mmol/L (ref 22–32)
Calcium: 9 mg/dL (ref 8.9–10.3)
Chloride: 105 mmol/L (ref 98–111)
Creatinine, Ser: 0.63 mg/dL (ref 0.44–1.00)
GFR, Estimated: >60 mL/min (ref >60)
Glucose, Bld: 239 mg/dL — ABNORMAL HIGH (ref 70–99)
Potassium: 4.1 mmol/L (ref 3.5–5.1)
Sodium: 139 mmol/L (ref 135–145)

## 2021-07-08 LAB — GLUCOSE, CAPILLARY
Glucose-Capillary: 212 mg/dL — ABNORMAL HIGH (ref 70–99)
Glucose-Capillary: 227 mg/dL — ABNORMAL HIGH (ref 70–99)
Glucose-Capillary: 273 mg/dL — ABNORMAL HIGH (ref 70–99)
Glucose-Capillary: 293 mg/dL — ABNORMAL HIGH (ref 70–99)
Glucose-Capillary: 295 mg/dL — ABNORMAL HIGH (ref 70–99)
Glucose-Capillary: 301 mg/dL — ABNORMAL HIGH (ref 70–99)

## 2021-07-08 MED ORDER — INSULIN GLARGINE-YFGN 100 UNIT/ML ~~LOC~~ SOLN
38.0000 [IU] | Freq: Two times a day (BID) | SUBCUTANEOUS | Status: DC
Start: 2021-07-08 — End: 2021-07-09
  Administered 2021-07-08 – 2021-07-09 (×3): 38 [IU] via SUBCUTANEOUS
  Filled 2021-07-08 (×4): qty 0.38

## 2021-07-08 NOTE — Progress Notes (Addendum)
PROGRESS NOTE    Lauren Velazquez  [id number] DOB: [birth year] DOA: 06/30/2021 PCP: Denton Lank, MD   Brief Narrative/Hospital Course: Lauren Velazquez, 60 y.o. female with PMH of diabetes hypertension chronic low back pain chronic right leg pain on chronic high-dose opiates, asthma, chronic hypoxic aspiratory failure on 3.5 L nasal cannula presented to [facility] with erratic behavior on 11/5-multiple complaints of chest pain shortness of breath, agitated bizarre behavior, trying to stick her finger down the throat to make her self vomit. Refer to previous ED notes in detail patient was placed on IVC, while in ED became febrile, septic with severely altered mental status concerning for meningitis.  Other differential included viral meningitis sturgeon syndrome opioid withdrawal.  Chest x-ray UA no obvious source of infection.  She had increasing agitation refractory to benzodiazepine then subsequently sedated and intubated and had LP done.  MRI 11/7-showed acute infarct.  Was successfully extubated 11/12. 11/15 transferred to [place] On 11/16 transferred to the floor intermittently drowsy throughout the day then had an episode of complete unresponsiveness with foaming and posturing, acute hypotension, was intubated emergently and transferred to ICU seen by neurology given Keppra and transferred to [facility] for continuous EEG 11/22 to [place]  Significant event 06/20/21- Admit to ICU  with suspected meningitis s/p mechanical intubation for airway protection in the setting of increased sedation needs and agitation. S/p LP @2 :37 AM. ID consult. Patient started empirically on vancomycin, ceftriaxone, ampicillin and acyclovir.  ID consult. 06/21/21- MRI w/out contrast shows acute infarcts (embolic vs. Hypotensive). Neuro, and psych consulted.  TTE showed normal LVEF, grade I diastolic dysfunction, no sig valvular abnormalities and no intracardiac clot.  rEEG showed moderate diffuse slowing with subtle superimposed  right focal slowing without epileptiform abnormalities 06/22/21-persistent agitation off of sedation.  Started Depakene for mood stabilization and cyproheptadine for potential serotonergic symptoms. 06/23/21: Remains on the vent and sedated. 06/24/21: Remains on the vent. Neuro eval recs central embolic suspected.  Start ASA 81mg  daily + plavix 75mg  daily x21 days f/b ASA 81mg  daily after that.  Consider TEE 06/25/21: Remains on the vent and sedation.  Status post TEE negative for thrombus or vegetation. 06/26/21: S/p extubation now IVC with sitter at the bedside 06/27/21: Stable on [place] @2L  with fluctuating mental status. Sitter at bedside 06/28/21: Restart home dose of oxycodone for concern of possible withdrawal. EEG per neuro r/o seizure activity. Patient stable, able to be transferred to step down care.  06/29/21: Transferred to Med/surg 06/30/21: Rapid response called for unresponsiveness and hypotension.  Upon arrival to ICU concern for seizure activity.  Intubated for airway protection.  Transferred to [facility] for continuous EEG, PCCM admit 11/17 no further seizures. More scattered punctate infarcts on MRI compared to 11/7  11/18 extubated  11/19 had 2 witnessed seizures, loaded with Keppra, on EEG monitoring    Subjective: Patient complains of pain in her back going down her right leg which is chronic for her.  Has noted vaginal spotting.  Not any worse compared to yesterday.  Denies any abdominal pain.  No nausea vomiting.     Assessment & Plan:  Acute metabolic encephalopathy Reason for encephalopathy was thought to be medication induced. Patient was placed on Seroquel 50 mg twice a day.  Mentation has improved.  Seems to be back to baseline now.  Continue to monitor closely.  No focal neurological deficits noted.    Chronic pain on opiates and Neurontin Home medication list was reviewed.  It looks like she takes Percocet 10/325  1 tablet every 5 hours as needed for pain.  Also noted to be  on Neurontin 900 mg 3 times a day.  There is also tramadol listed on her home medication list.   Complains of back pain and right leg pain which is chronic.  Recently received steroid injection to the back.   Oxycodone was started to 3 times a day as needed.  Increase to 4 times a day as needed yesterday.  Tramadol on hold due to seizures.  Continue current doses for now.   Gabapentin being continued at a lower dose of 600 mg 3 times a day.    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  Duplex shows left  leg with age indeterminate DVT on 07/02/20.  Neurology following.  Completed a stroke work-up echo shows EF 60 to 65%, TEE no shunt, LDL 133, HbA1c 8.2. On asa 81 mg, Zetia.  Not on statins due to intolerance.  Continue PT OT.  Seizure episode:  Continue Keppra and Vimpat as per neurology.  Continue seizure precaution, no driving x6 months.  Remains seizure-free.  DVT involving left lower extremity, age indeterminate versus chronic  Case was discussed with hematology (Dr. Benay Spice) by previous rounding physician.  They recommended repeating Doppler study and to initiate anticoagulation if positive.  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  Will benefit from 3 months of anticoagulation.  Will initiate rivaroxaban.  Continues to have vaginal spotting.  Hemoglobin noted to be stable.  If her vaginal bleeding increases then we may have to stop her anticoagulation.    Vaginal bleeding postmenopausal Underwent a transabdominal ultrasound.  No fibroids were noted.  Cystic lesion of the left ovary or adnexa was noted. GYN consultation in the outpatient setting was recommended.   Continues to have vaginal spotting as discussed above.  We will need to see if it worsens or not.  Hemoglobin stable.   She is not interested in undergoing transvaginal ultrasound.  She would prefer to follow-up with her GYN in  [place].  Normocytic anemia Noted to have longstanding history of anemia.  Reason for this is not clear.  Will check anemia panel.  Acute on chronic hypoxic respiratory failure extubated 11/18 OSA Chronic restrictive lung disease post COVID Possible aspiration event Reports using 3.5 L nocturnal oxygen at home which is her current setting.  Followed by [facility]. Completed Unasyn for aspiration.  Respiratory status is stable.  Essential hypertension Occasional high blood pressure readings noted.  Currently on amlodipine and lisinopril.  May need to add additional agents.  Mild to moderate MR outpatient follow-up  Diabetes mellitus type 2, poorly controlled with hyperglycemia  HbA1c 9.1. At home she is on Lantus insulin 70 units twice a day along with the NovoLog sliding scale basis.   Currently on glargine insulin.  CBGs noted to be elevated over the last 24 hours.  Will increase the dose of glargine.  Dysphagia seen by speech on dysphagia 3 diet.  Urinary retention  Foley was discontinued yesterday.  She has been able to void on her own.  Patient is on bethanechol.  Physical deconditioning/debility: Continue PT OT has suggested inpatient rehab TOC on consult.  [facility] is outside of the network  Class III Obesity: Estimated body mass index is 46.36 kg/m as calculated from the following:   Height as of this encounter: 5\' 1"  (1.549 m).   Weight as of this encounter: 111.3 kg.  DVT prophylaxis: Subcutaneous heparin Code Status:   Code Status: Full Code Family Communication: Discussed with patient.  No family at bedside.  Status is: Inpatient Remains inpatient appropriate because: For ongoing management of deconditioning debility Disposition: Currently not  medically stable for discharge. Anticipated Disposition: Inpatient rehabilitation  Objective: Vitals last 24 hrs: Vitals:   07/08/21 0422 07/08/21 0851 07/08/21 0913 07/08/21 0914  BP: (!) 144/68 (!) 158/80     Pulse: 92 80    Resp: 18 17    Temp: 98.1 F (36.7 C) 98.2 F (36.8 C)    TempSrc: Oral Oral    SpO2: (!) 89% 100% 98% 98%  Weight:      Height:       Weight change:   Intake/Output Summary (Last 24 hours) at 07/08/2021 0946 Last data filed at 07/08/2021 0200 Gross per 24 hour  Intake 600 ml  Output 2300 ml  Net -1700 ml    Net IO Since Admission: -2,916.18 mL [07/08/21 0946]    Physical Examination:  General appearance: Awake alert.  In no distress Resp: Clear to auscultation bilaterally.  Normal effort Cardio: S1-S2 is normal regular.  No S3-S4.  No rubs murmurs or bruit GI: Abdomen is soft.  Nontender nondistended.  Bowel sounds are present normal.  No masses organomegaly Extremities: No edema.  Full range of motion of lower extremities. Neurologic: Alert and oriented x3.  Weakness of the right lower extremity from recent stroke     Medications reviewed:  Scheduled Meds:  amLODipine  10 mg Oral Daily   bethanechol  10 mg Oral TID   budesonide  1 mg Nebulization BID   Chlorhexidine Gluconate Cloth  6 each Topical Daily   ezetimibe  10 mg Oral Daily   feeding supplement (GLUCERNA SHAKE)  237 mL Oral TID BM   gabapentin  600 mg Oral TID   insulin aspart  0-20 Units Subcutaneous TID WC   insulin aspart  0-5 Units Subcutaneous QHS   insulin glargine-yfgn  36 Units Subcutaneous BID   lacosamide  100 mg Oral BID   levETIRAcetam  1,000 mg Oral BID   lisinopril  40 mg Oral Daily   LORazepam  2 mg Intravenous Once   QUEtiapine  50 mg Oral BID   Rivaroxaban  15 mg Oral BID WC   Followed by   Melene Muller ON 07/28/2021] rivaroxaban  20 mg Oral Q supper   sodium chloride flush  10-40 mL Intracatheter Q12H   umeclidinium-vilanterol  1 puff Inhalation Daily   Continuous Infusions:  sodium chloride Stopped (07/05/21 1206)   Diet Order             DIET DYS 3 Room service appropriate? Yes; Fluid consistency: Thin  Diet effective now                   Nutrition  Problem: Inadequate oral intake Etiology: inability to eat Signs/Symptoms: NPO status Interventions: Glucerna shake  Weight change:   Wt Readings from Last 3 Encounters:  07/05/21 111.3 kg  06/28/21 104.8 kg  07/17/20 104.9 kg     Consultants: Seen by neurology and critical care medicine  Procedures:see note Antimicrobials:  11/6 Vancomycin >> 11/10 11/6 Ampicillin >>11/13 11/6 Ceftriaxone >>11/14 11/6 Acyclovir >>11/9 11/6 Cefepime >> x 1 dose 11/16 unasyn -11/20  Culture/Microbiology  11/6>> CSF- mild pleocytosis. Culture- no growth at 4 days. VDRL- negative.  11/6>> blood cultures- negative (no growth @4d ) 11/6>> MRSA PCR - negative  11/6>> HSV1/2- negative  11/6>>  Crypotococcal Ag - negative      Component Value Date/Time   SDES  06/30/2021 1715    TRACHEAL ASPIRATE Performed at [facility], [address] [place], [place] [address]    [facility]  06/30/2021 1715    NONE Performed at [facility], [address] [place] B[place], [place] [address]    CULT  06/30/2021 1715    FEW Normal respiratory flora-no Staph aureus or Pseudomonas seen Performed at [facility] [address] [place], [place] [address]    REPTSTATUS 07/03/2021 FINAL 06/30/2021 1715    Other culture-see note  Unresulted Labs (From admission, onward)     Start     Ordered   07/08/21 0500  CBC  Daily,   R     Question:  Specimen collection method  Answer:  Lab=Lab collect   07/07/21 1034   07/02/21 1239  Factor 5 leiden  (Hypercoagulable Panel, Comprehensive (PNL))  Once,   R       Question:  Specimen collection method  Answer:  Lab=Lab collect   07/02/21 1239   07/02/21 1239  Prothrombin gene mutation  (Hypercoagulable Panel, Comprehensive (PNL))  Once,   R       Question:  Specimen collection method  Answer:  Lab=Lab collect   07/02/21 1239   07/02/21 1239  Phosphatidylserine antibodies  Once,   R       Question:  Specimen collection method  Answer:  Lab=Lab  collect   07/02/21 1239           Data Reviewed: I have personally reviewed following labs and imaging studies  CBC: Recent Labs  Lab 07/02/21 0355 07/03/21 0342 07/06/21 0159 07/08/21 0113  WBC 9.6 11.6* 11.9* 9.6  NEUTROABS 8.0* 9.1*  --   --   HGB 8.9* 9.4* 9.3* 10.2*  HCT 31.0* 32.4* 31.7* 35.8*  MCV 84.7 82.4 82.1 83.4  PLT 407* 483* 429* 451*    Basic Metabolic Panel: Recent Labs  Lab 07/02/21 0355 07/03/21 0342 07/06/21 0159 07/08/21 0113  NA 140 141 142 139  K 4.4 3.6 3.6 4.1  CL 108 103 104 105  CO2 25 28 28 26   GLUCOSE 260* 223* 155* 239*  BUN 17 9 9  5*  CREATININE 0.67 0.53 0.64 0.63  CALCIUM 8.7* 8.8* 8.8* 9.0  MG 2.5*  --   --   --   PHOS 3.0  --   --   --     GFR: Estimated Creatinine Clearance: 88.6 mL/min (by C-G formula based on SCr of 0.63 mg/dL).  CBG: Recent Labs  Lab 07/07/21 1621 07/07/21 2044 07/08/21 0016 07/08/21 0424 07/08/21 0856  GLUCAP 199* 322* 227* 212* 295*      Recent Results (from the past 240 hour(s))  Culture, Respiratory w Gram Stain     Status: None   Collection Time: 06/30/21  5:15 PM   Specimen: Tracheal Aspirate; Respiratory  Result Value Ref Range Status   Specimen Description   Final    TRACHEAL ASPIRATE Performed at [facility], [address] [place], [place] [address]    Special Requests   Final    NONE Performed at [facility], [address] [place], [place] [address]    Gram Stain   Final    FEW SQUAMOUS EPITHELIAL CELLS PRESENT MODERATE WBC PRESENT, PREDOMINANTLY MONONUCLEAR MODERATE GRAM POSITIVE COCCI FEW GRAM NEGATIVE RODS    Culture   Final    FEW Normal respiratory flora-no Staph  aureus or Pseudomonas seen Performed at [facility], [place] [address] [place], [place] [address]    Report Status 07/03/2021 FINAL  Final      Radiology Studies: VAS [place] TRANSCRANIAL DOPPLER W BUBBLES  Result Date: 07/07/2021  Transcranial Doppler with Bubble Patient  Name:  REILEY UPRIGHT  Date of Exam:   07/07/2021 Medical Rec #: [id number]       Accession #:    [id number] Date of Birth: [birth year]       Patient Gender: F Patient Age:   35 years Exam Location:  [facility] Procedure:      VAS [place] TRANSCRANIAL DOPPLER W BUBBLES Referring Phys: PRAMOD SETHI --------------------------------------------------------------------------------  Indications: Stroke. Comparison Study: no prior Performing Technologist: Archie Patten RVS  Examination Guidelines: A complete evaluation includes B-mode imaging, spectral Doppler, color Doppler, and power Doppler as needed of all accessible portions of each vessel. Bilateral testing is considered an integral part of a complete examination. Limited examinations for reoccurring indications may be performed as noted.  Summary: No HITS at rest or during Valsalva. Negative transcranial Doppler Bubble study with no evidence of right to left intracardiac communication.  A vascular evaluation was performed. The right middle cerebral artery was studied. An IV was inserted into the patient's left forearm . Verbal informed consent was obtained.  Negative TCD Bubble study with no evidence of right to left shunt *See table(s) above for TCD measurements and observations.  Diagnosing physician: Antony Contras MD Electronically signed by Antony Contras MD on 07/07/2021 at 2:06:49 PM.    Final    VAS [place] LOWER EXTREMITY VENOUS (DVT)  Result Date: 07/08/2021  Lower Venous DVT Study Patient Name:  CHENEE CORELLA  Date of Exam:   07/07/2021 Medical Rec #: [id number]       Accession #:    [id number] Date of Birth: 16-Jun-1961       Patient Gender: F Patient Age:   31 years Exam Location:  [facility] Procedure:      VAS [place] LOWER EXTREMITY VENOUS (DVT) Referring Phys: [place] --------------------------------------------------------------------------------  Indications: F/u ptv dvt 07/02/21.  Comparison Study: 07/02/21 prior Performing Technologist:  Archie Patten RVS  Examination Guidelines: A complete evaluation includes B-mode imaging, spectral Doppler, color Doppler, and power Doppler as needed of all accessible portions of each vessel. Bilateral testing is considered an integral part of a complete examination. Limited examinations for reoccurring indications may be performed as noted. The reflux portion of the exam is performed with the patient in reverse Trendelenburg.  +-----+---------------+---------+-----------+----------+--------------+ RIGHTCompressibilityPhasicitySpontaneityPropertiesThrombus Aging +-----+---------------+---------+-----------+----------+--------------+ CFV  Full           Yes      Yes                                 +-----+---------------+---------+-----------+----------+--------------+   +---------+---------------+---------+-----------+----------+-----------------+ LEFT     CompressibilityPhasicitySpontaneityPropertiesThrombus Aging    +---------+---------------+---------+-----------+----------+-----------------+ CFV      Full           Yes      Yes                                    +---------+---------------+---------+-----------+----------+-----------------+ SFJ      Full                                                           +---------+---------------+---------+-----------+----------+-----------------+  FV Prox  Full                                                           +---------+---------------+---------+-----------+----------+-----------------+ FV Mid   Full                                                           +---------+---------------+---------+-----------+----------+-----------------+ FV DistalFull                                                           +---------+---------------+---------+-----------+----------+-----------------+ PFV      Full                                                            +---------+---------------+---------+-----------+----------+-----------------+ POP      Full           Yes      Yes                                    +---------+---------------+---------+-----------+----------+-----------------+ PTV      None                                         Chronic           +---------+---------------+---------+-----------+----------+-----------------+ PERO                                                  Age Indeterminate +---------+---------------+---------+-----------+----------+-----------------+     Summary: RIGHT: - No evidence of common femoral vein obstruction.  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 - Findings appear essentially unchanged compared to previous examination. - No cystic structure found in the popliteal fossa.  *See table(s) above for measurements and observations. Electronically signed by Harold Barban MD on 07/08/2021 at 12:51:38 AM.    Final      LOS: 8 days   Bonnielee Haff, MD [facility]  07/08/2021, 9:46 AM

## 2021-07-08 NOTE — Plan of Care (Signed)

## 2021-07-08 NOTE — Progress Notes (Signed)
Pts AM blood sugar not taken prior to pt eating all of her breakfast tray and drinking OJ. No SSI given for 0800 coverage due to inaccurate reading at this time.

## 2021-07-09 DIAGNOSIS — D500 Iron deficiency anemia secondary to blood loss (chronic): Secondary | ICD-10-CM

## 2021-07-09 LAB — CBC
HCT: 29.7 % — ABNORMAL LOW (ref 36.0–46.0)
Hemoglobin: 8.6 g/dL — ABNORMAL LOW (ref 12.0–15.0)
MCH: 24.2 pg — ABNORMAL LOW (ref 26.0–34.0)
MCHC: 29 g/dL — ABNORMAL LOW (ref 30.0–36.0)
MCV: 83.4 fL (ref 80.0–100.0)
Platelets: 370 10*3/uL (ref 150–400)
RBC: 3.56 MIL/uL — ABNORMAL LOW (ref 3.87–5.11)
RDW: 15.9 % — ABNORMAL HIGH (ref 11.5–15.5)
WBC: 8.8 10*3/uL (ref 4.0–10.5)
nRBC: 0 % (ref 0.0–0.2)

## 2021-07-09 LAB — GLUCOSE, CAPILLARY
Glucose-Capillary: 349 mg/dL — ABNORMAL HIGH (ref 70–99)
Glucose-Capillary: 244 mg/dL — ABNORMAL HIGH (ref 70–99)
Glucose-Capillary: 287 mg/dL — ABNORMAL HIGH (ref 70–99)
Glucose-Capillary: 376 mg/dL — ABNORMAL HIGH (ref 70–99)
Glucose-Capillary: 269 mg/dL — ABNORMAL HIGH (ref 70–99)

## 2021-07-09 LAB — PHOSPHATIDYLSERINE ANTIBODIES
Phosphatydalserine, IgA: 1 APS Units (ref 0–19)
Phosphatydalserine, IgG: 9 Units (ref 0–30)
Phosphatydalserine, IgM: 13 Units (ref 0–30)

## 2021-07-09 LAB — IRON AND TIBC
Iron: 19 ug/dL — ABNORMAL LOW (ref 28–170)
Saturation Ratios: 5 % — ABNORMAL LOW (ref 10.4–31.8)
TIBC: 360 ug/dL (ref 250–450)
UIBC: 341 ug/dL

## 2021-07-09 LAB — VITAMIN B12: Vitamin B-12: 308 pg/mL (ref 180–914)

## 2021-07-09 LAB — FOLATE: Folate: 5.8 ng/mL — ABNORMAL LOW (ref >5.9)

## 2021-07-09 LAB — RETICULOCYTES
Immature Retic Fract: 30.9 % — ABNORMAL HIGH (ref 2.3–15.9)
RBC.: 3.54 MIL/uL — ABNORMAL LOW (ref 3.87–5.11)
Retic Count, Absolute: 95.2 10*3/uL (ref 19.0–186.0)
Retic Ct Pct: 2.7 % (ref 0.4–3.1)

## 2021-07-09 LAB — FERRITIN: Ferritin: 13 ng/mL (ref 11–307)

## 2021-07-09 LAB — PROTHROMBIN GENE MUTATION

## 2021-07-09 MED ORDER — MEGESTROL ACETATE 40 MG PO TABS
40.0000 mg | ORAL_TABLET | Freq: Two times a day (BID) | ORAL | Status: DC
  Administered 2021-07-09 – 2021-07-20 (×23): 40 mg via ORAL
  Filled 2021-07-09 (×24): qty 1

## 2021-07-09 MED ORDER — INSULIN GLARGINE-YFGN 100 UNIT/ML ~~LOC~~ SOLN
40.0000 [IU] | Freq: Two times a day (BID) | SUBCUTANEOUS | Status: DC
Start: 2021-07-09 — End: 2021-07-10
  Administered 2021-07-09 – 2021-07-10 (×2): 40 [IU] via SUBCUTANEOUS
  Filled 2021-07-09 (×4): qty 0.4

## 2021-07-09 MED ORDER — INSULIN ASPART 100 UNIT/ML IJ SOLN
4.0000 [IU] | Freq: Three times a day (TID) | INTRAMUSCULAR | Status: DC
  Administered 2021-07-09 – 2021-07-12 (×9): 4 [IU] via SUBCUTANEOUS

## 2021-07-09 MED ORDER — FOLIC ACID 1 MG PO TABS
1.0000 mg | ORAL_TABLET | Freq: Every day | ORAL | Status: DC
  Administered 2021-07-09 – 2021-07-20 (×12): 1 mg via ORAL
  Filled 2021-07-09 (×12): qty 1

## 2021-07-09 MED ORDER — SODIUM CHLORIDE 0.9 % IV SOLN
250.0000 mg | Freq: Every day | INTRAVENOUS | Status: AC
  Administered 2021-07-09 – 2021-07-10 (×2): 250 mg via INTRAVENOUS
  Filled 2021-07-09 (×2): qty 20

## 2021-07-09 NOTE — Progress Notes (Signed)
Patient refusing peri-pads to monitor vaginal bleeding. She states she is only minimally bleeding and prefers not to wear anything. Patient encouraged to continue to use pads to quantify amount of bleeding for MD plan of care. Dr. Rito Ehrlich notified of obstacle.

## 2021-07-09 NOTE — Progress Notes (Signed)
Physical Therapy Treatment Patient Details Name: Lauren Velazquez MRN: [id number] DOB: February 17, 1961 Today's Date: 07/09/2021   History of Present Illness 60 yo female presenting to [facility] ED on 11/6 with AMS with agitation; suspect viral meningitis. Intubated 11/6-11/12 for airway protection after sedation. Transfered to [facility] on 11/15. EEG negative for ongoing seizures. Rapid response called for unresponsiveness and hypotension on 11/16. 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 Intubated 11/16-11/18. PMH including HTN, T2DM, vertigo, anxiety, chronic back pain, and HLD.    PT Comments    Pt received bathroom on RA, alert and agreeable to therapy session and with good participation and tolerance for transfer training and short hallway gait trials. Pt remains reliant on RW but with improved management of assistive device (needing min guard only with RW today) and needs minA for short distance gait in room with HHA. Pt performed seated LE exercises with dense cues for technique/reps and with improved activity tolerance today on RA and improved pain tolerance. Pt continues to benefit from PT services to progress toward functional mobility goals. Continue to recommend high intensity post-acute rehab.  Recommendations for follow up therapy are one component of a multi-disciplinary discharge planning process, led by the attending physician.  Recommendations may be updated based on patient status, additional functional criteria and insurance authorization.  Follow Up Recommendations  Acute inpatient rehab (3hours/day)     Assistance Recommended at Discharge Frequent or constant Supervision/Assistance  Equipment Recommendations  Other (comment) (TBD)    Recommendations for Other Services       Precautions / Restrictions Precautions Precautions: Fall Precaution Comments: vaginal bleeding (MD wants pads  counted) Restrictions Weight Bearing Restrictions: No     Mobility  Bed Mobility      General bed mobility comments: Pt received and left in chair    Transfers Overall transfer level: Needs assistance Equipment used: Rolling walker (2 wheels) Transfers: Sit to/from Stand Sit to Stand: Min guard;Min assist    General transfer comment: cues for safe hand placement with poor carryover for stand>sit, but fair recall with sit>stand    Ambulation/Gait Ambulation/Gait assistance: Min assist;Min guard Gait Distance (Feet): 50 Feet (14ft, 12ft, 42ft with seated break between all trials) Assistive device: Rolling walker (2 wheels);1 person hand held assist Gait Pattern/deviations: Step-to pattern;Trunk flexed;Drifts right/left;Decreased stride length Gait velocity: decreased     General Gait Details: pt with improved safety using RW today and min cues only for activity pacing; SpO2 WFL on RA throughout (pulse ox monitored for each trial). Short gait task in room with minA via HHA but otherwise used RW with mostly min guard      Modified Rankin (Stroke Patients Only) Modified Rankin (Stroke Patients Only) Pre-Morbid Rankin Score: No symptoms Modified Rankin: Moderately severe disability     Balance Overall balance assessment: Needs assistance Sitting-balance support: Bilateral upper extremity supported;Feet supported Sitting balance-Leahy Scale: Fair     Standing balance support: Bilateral upper extremity supported;During functional activity;Reliant on assistive device for balance Standing balance-Leahy Scale: Poor Standing balance comment: RW for dynamic tasks, needs external support for short distances without RW but unsteady with only U UE support         Cognition Arousal/Alertness: Awake/alert Behavior During Therapy: WFL for tasks assessed/performed;Impulsive Overall Cognitive Status: Impaired/Different from baseline Area of Impairment: Attention;Memory;Following  commands;Safety/judgement;Problem solving;Awareness      Current Attention Level: Sustained Memory: Decreased short-term memory;Decreased recall of precautions Following Commands: Follows one step  commands with increased time Safety/Judgement: Decreased awareness of deficits;Decreased awareness of safety Awareness: Intellectual Problem Solving: Requires verbal cues General Comments: decreased recall of safety cues for hand placement but improved safety compared with previous session, including that pt had pulled call bell in bathroom just prior to PTA entering room and was aware of need for assist.        Exercises Other Exercises Other Exercises: seated BLE AROM: ankle pumps, LAQ, hip flexion x10 reps ea    General Comments General comments (skin integrity, edema, etc.): pt received on toilet, scant amount of blood clots/slightly pinkish hue after toileting, left for RN to observe per MD order tracking pad usage assisted pt to place pad/mesh briefs.      Pertinent Vitals/Pain Pain Assessment: 0-10 Pain Score: 4  Pain Location: Sciatica, RLE Pain Descriptors / Indicators: Discomfort;Tingling;Numbness Pain Intervention(s): Limited activity within patient's tolerance;Monitored during session;Repositioned;Premedicated before session     PT Goals (current goals can now be found in the care plan section) Acute Rehab PT Goals Patient Stated Goal: did not state PT Goal Formulation: With patient Time For Goal Achievement: 07/16/21 Progress towards PT goals: Progressing toward goals    Frequency    Min 4X/week      PT Plan Current plan remains appropriate       AM-PAC PT "6 Clicks" Mobility   Outcome Measure  Help needed turning from your back to your side while in a flat bed without using bedrails?: A Little Help needed moving from lying on your back to sitting on the side of a flat bed without using bedrails?: A Little Help needed moving to and from a bed to a chair  (including a wheelchair)?: A Little Help needed standing up from a chair using your arms (e.g., wheelchair or bedside chair)?: A Little Help needed to walk in hospital room?: A Lot (mod+ cues for all below) Help needed climbing 3-5 steps with a railing? : Total 6 Click Score: 15    End of Session Equipment Utilized During Treatment: Gait belt Activity Tolerance: Patient tolerated treatment well Patient left: in chair;with call bell/phone within reach;with chair alarm set Nurse Communication: Mobility status PT Visit Diagnosis: Unsteadiness on feet (R26.81);Muscle weakness (generalized) (M62.81);Other abnormalities of gait and mobility (R26.89);Other symptoms and signs involving the nervous system (R29.898)     Time: 9450-3888 PT Time Calculation (min) (ACUTE ONLY): 23 min  Charges:  $Gait Training: 8-22 mins                     Arabela Basaldua P., PTA Acute Rehabilitation Services Pager: [phone] Office: [phone]    [place] 07/09/2021, 1:26 PM

## 2021-07-09 NOTE — Progress Notes (Signed)
PROGRESS NOTE    Lauren Velazquez  [id number] DOB: 10-Feb-1961 DOA: 06/30/2021 PCP: Denton Lank, MD   Brief Narrative/Hospital Course: Lauren Velazquez, 60 y.o. female with PMH of diabetes hypertension chronic low back pain chronic right leg pain on chronic high-dose opiates, asthma, chronic hypoxic aspiratory failure on 3.5 L nasal cannula presented to [facility] with erratic behavior on 11/5-multiple complaints of chest pain shortness of breath, agitated bizarre behavior, trying to stick her finger down the throat to make her self vomit. Refer to previous ED notes in detail patient was placed on IVC, while in ED became febrile, septic with severely altered mental status concerning for meningitis.  Other differential included viral meningitis sturgeon syndrome opioid withdrawal.  Chest x-ray UA no obvious source of infection.  She had increasing agitation refractory to benzodiazepine then subsequently sedated and intubated and had LP done.  MRI 11/7-showed acute infarct.  Was successfully extubated 11/12. 11/15 transferred to [place] On 11/16 transferred to the floor intermittently drowsy throughout the day then had an episode of complete unresponsiveness with foaming and posturing, acute hypotension, was intubated emergently and transferred to ICU seen by neurology given Keppra and transferred to [facility] for continuous EEG 11/22 to [place]  Significant event 06/20/21- Admit to ICU  with suspected meningitis s/p mechanical intubation for airway protection in the setting of increased sedation needs and agitation. S/p LP. ID consult. Patient started empirically on vancomycin, ceftriaxone, ampicillin and acyclovir.  ID consult. 06/21/21- MRI w/out contrast shows acute infarcts (embolic vs. Hypotensive). Neuro, and psych consulted.  TTE showed normal LVEF, grade I diastolic dysfunction, no sig valvular abnormalities and no intracardiac clot.  rEEG showed moderate diffuse slowing with subtle superimposed right  focal slowing without epileptiform abnormalities 06/22/21-persistent agitation off of sedation.  Started Depakene for mood stabilization and cyproheptadine for potential serotonergic symptoms. 06/23/21: Remains on the vent and sedated. 06/24/21: Remains on the vent. Neuro eval recs central embolic suspected.  Start ASA 81mg  daily + plavix 75mg  daily x21 days f/b ASA 81mg  daily after that.  Consider TEE 06/25/21: Remains on the vent and sedation.  Status post TEE negative for thrombus or vegetation. 06/26/21: S/p extubation now IVC with sitter at the bedside 06/27/21: Stable on [place] @2L  with fluctuating mental status. Sitter at bedside 06/28/21: Restart home dose of oxycodone for concern of possible withdrawal. EEG per neuro r/o seizure activity. Patient stable, able to be transferred to step down care.  06/29/21: Transferred to Med/surg 06/30/21: Rapid response called for unresponsiveness and hypotension.  Upon arrival to ICU concern for seizure activity.  Intubated for airway protection.  Transferred to [facility] for continuous EEG, PCCM admit 11/17 no further seizures. More scattered punctate infarcts on MRI compared to 11/7  11/18 extubated  11/19 had 2 witnessed seizures, loaded with Keppra, on EEG monitoring    Subjective: Patient mentions that her back pain is well controlled.  Continues to have weakness in the right leg.  Has continued to have some vaginal bleeding.  She was notified of the drop in hemoglobin today.     Assessment & Plan:  Acute metabolic encephalopathy Reason for encephalopathy was thought to be medication induced. Patient was placed on Seroquel 50 mg twice a day.  Mentation has improved.  Seems to be back to baseline now.  Continue to monitor closely.  No focal neurological deficits noted.    Chronic pain on opiates and Neurontin Home medication list was reviewed.  It looks like she takes Percocet 10/325 1 tablet every 5  hours as needed for pain.  Also noted to be on  Neurontin 900 mg 3 times a day.  There is also tramadol listed on her home medication list.   Complains of back pain and right leg pain which is chronic.  Recently received steroid injection to the back.   Continue oxycodone.  She should not be getting any more than 10 mg of oxycodone every 6 hours for now.  If this does not control her pain may be we can increase the frequency but will try this regimen for now. Gabapentin being continued at a lower dose of 600 mg 3 times a day.    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  Duplex shows left  leg with age indeterminate DVT on 07/02/20.  Neurology following.  Completed a stroke work-up echo shows EF 60 to 65%, TEE no shunt, LDL 133, HbA1c 8.2. On asa 81 mg, Zetia.  Not on statins due to intolerance.  Continue PT OT.  Seizure episode:  Continue Keppra and Vimpat as per neurology.  Continue seizure precaution, no driving x6 months.  Remains seizure-free.  DVT involving left lower extremity, age indeterminate versus chronic  Case was discussed with hematology (Dr. Truett Perna) by previous rounding physician.  They recommended repeating Doppler study and to initiate anticoagulation if positive.  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  Will benefit from 3 months of anticoagulation.  Will initiate rivaroxaban.  Continues to have vaginal spotting.  Hemoglobin noted to be stable.  If her vaginal bleeding increases then we may have to stop her anticoagulation.    Vaginal bleeding postmenopausal Underwent a transabdominal ultrasound.  No fibroids were noted.  Cystic lesion of the left ovary or adnexa was noted. GYN consultation in the outpatient setting was recommended.   She is not interested in undergoing transvaginal ultrasound.  She would prefer to follow-up with her GYN in [place]. Drop in hemoglobin noted this morning.  Continues to have vaginal bleeding.  Discussed  with her GYN, Dr. Vergie Living who recommends a pad count while she is here and also recommends Megace 40 mg twice a day. She will need to follow-up with her GYN in [place].  Normocytic anemia/iron deficiency/folic acid deficiency Noted to have longstanding history of anemia.  Reason for this is not clear.  Drop in hemoglobin noted this morning from 10.2-8.6.  Anemia panel does suggest iron deficiency.  Folic acid level also noted low at 5.8.  B12 level 308.  Was started on B12 and folate supplementation.  We will give her IV iron.  She will need iron supplements at discharge.  Recheck hemoglobin tomorrow. Some of the drop in hemoglobin could also be from her vaginal bleeding as discussed above.  Acute on chronic hypoxic respiratory failure extubated 11/18 OSA Chronic restrictive lung disease post COVID Possible aspiration event Reports using 3.5 L nocturnal oxygen at home which is her current setting.  Followed by [facility]. Completed Unasyn for aspiration.  Respiratory status is stable.  Essential hypertension Occasional high blood pressure readings noted.  Currently on amlodipine and lisinopril.  Continue current regimen for now.  May need to add additional agents but will monitor for now.  Mild to moderate MR outpatient follow-up  Diabetes mellitus type 2, poorly controlled with hyperglycemia  HbA1c 9.1. At home she is on Lantus insulin 70 units twice a day along with the NovoLog sliding scale basis.   Currently on glargine insulin.  Dose of glargine was adjusted yesterday.  Will make further dose adjustments today.    Dysphagia seen by speech on dysphagia 3 diet.  Urinary retention  Foley was discontinued on 11/23.  She has been able to void on her own.  Patient is on bethanechol.  Will discontinue at discharge.  Physical deconditioning/debility: Continue PT OT has suggested inpatient rehab TOC on consult.  [facility] is outside of the network.  Patient reluctant to go to any form  of rehab.  Her husband apparently has dementia and so she would like to be home with him.  He does not have any physical limitations and even can help her at home.  We will see how she does with PT and OT over the next 2 to 3 days and will also need to make sure that her vaginal bleeding does not worsen.  Class III Obesity: Estimated body mass index is 45.57 kg/m as calculated from the following:   Height as of this encounter: 5\' 1"  (1.549 m).   Weight as of this encounter: 109.4 kg.   DVT prophylaxis: Subcutaneous heparin Code Status:   Code Status: Full Code Family Communication: Discussed with patient.  No family at bedside.  Status is: Inpatient Remains inpatient appropriate because: For ongoing management of deconditioning debility Disposition: Currently not  medically stable for discharge. Anticipated Disposition: Inpatient rehabilitation versus home with home health  Objective: Vitals last 24 hrs: Vitals:   07/08/21 2346 07/09/21 0306 07/09/21 0337 07/09/21 0800  BP: 138/70 (!) 160/74  (!) 158/68  Pulse:  78  75  Resp: (!) 21 (!) 21  20  Temp: 97.8 F (36.6 C) 97.7 F (36.5 C)  97.9 F (36.6 C)  TempSrc: Oral Axillary  Oral  SpO2: 94% 100%  99%  Weight:   109.4 kg   Height:       Weight change:  No intake or output data in the 24 hours ending 07/09/21 0923  Net IO Since Admission: -2,916.18 mL [07/09/21 0923]    Physical Examination:  General appearance: Awake alert.  In no distress Resp: Clear to auscultation bilaterally.  Normal effort Cardio: S1-S2 is normal regular.  No S3-S4.  No rubs murmurs or bruit GI: Abdomen is soft.  Nontender nondistended.  Bowel sounds are present normal.  No masses organomegaly Extremities: No edema.   Neurologic: Alert and oriented x3.  Weakness in the right lower extremities noted      Medications reviewed:  Scheduled Meds:  amLODipine  10 mg Oral Daily   bethanechol  10 mg Oral TID   budesonide  1 mg Nebulization BID    ezetimibe  10 mg Oral Daily   feeding supplement (GLUCERNA SHAKE)  237 mL Oral TID BM   gabapentin  600 mg Oral TID   insulin aspart  0-20 Units Subcutaneous TID WC   insulin aspart  0-5 Units Subcutaneous QHS   insulin glargine-yfgn  38 Units Subcutaneous BID   lacosamide  100 mg Oral BID   levETIRAcetam  1,000 mg Oral BID   lisinopril  40 mg Oral Daily   LORazepam  2 mg Intravenous Once   QUEtiapine  50 mg Oral BID   Rivaroxaban  15 mg Oral BID WC   Followed by   Derrill Memo ON 07/28/2021] rivaroxaban  20 mg Oral Q supper   sodium chloride flush  10-40 mL Intracatheter Q12H   umeclidinium-vilanterol  1 puff Inhalation Daily   Continuous Infusions:  sodium chloride Stopped (07/05/21 1206)  Diet Order             Diet Carb Modified Fluid consistency: Thin; Room service appropriate? Yes  Diet effective now                   Nutrition Problem: Inadequate oral intake Etiology: inability to eat Signs/Symptoms: NPO status Interventions: Glucerna shake  Weight change:   Wt Readings from Last 3 Encounters:  07/09/21 109.4 kg  06/28/21 104.8 kg  07/17/20 104.9 kg     Consultants: Seen by neurology and critical care medicine  Procedures:see note Antimicrobials:  11/6 Vancomycin >> 11/10 11/6 Ampicillin >>11/13 11/6 Ceftriaxone >>11/14 11/6 Acyclovir >>11/9 11/6 Cefepime >> x 1 dose 11/16 unasyn -11/20  Culture/Microbiology  11/6>> CSF- mild pleocytosis. Culture- no growth at 4 days. VDRL- negative.  11/6>> blood cultures- negative (no growth @4d ) 11/6>> MRSA PCR - negative  11/6>> HSV1/2- negative  11/6>> Crypotococcal Ag - negative      Component Value Date/Time   SDES  06/30/2021 1715    TRACHEAL ASPIRATE Performed at [facility], [address] [place], [place] [address]    [facility]  06/30/2021 1715    NONE Performed at [facility], [address], [place], [place] [address]    CULT  06/30/2021 1715    FEW Normal respiratory  flora-no Staph aureus or Pseudomonas seen Performed at [facility], [address] [place], [place] [address]    REPTSTATUS 07/03/2021 FINAL 06/30/2021 1715    Other culture-see note  Unresulted Labs (From admission, onward)     Start     Ordered   07/08/21 0500  CBC  Daily,   R     Question:  Specimen collection method  Answer:  Lab=Lab collect   07/07/21 1034   07/02/21 1239  Factor 5 leiden  (Hypercoagulable Panel, Comprehensive (PNL))  Once,   R       Question:  Specimen collection method  Answer:  Lab=Lab collect   07/02/21 1239   07/02/21 1239  Prothrombin gene mutation  (Hypercoagulable Panel, Comprehensive (PNL))  Once,   R       Question:  Specimen collection method  Answer:  Lab=Lab collect   07/02/21 1239   07/02/21 1239  Phosphatidylserine antibodies  Once,   R       Question:  Specimen collection method  Answer:  Lab=Lab collect   07/02/21 1239           Data Reviewed: I have personally reviewed following labs and imaging studies  CBC: Recent Labs  Lab 07/03/21 0342 07/06/21 0159 07/08/21 0113 07/09/21 0348  WBC 11.6* 11.9* 9.6 8.8  NEUTROABS 9.1*  --   --   --   HGB 9.4* 9.3* 10.2* 8.6*  HCT 32.4* 31.7* 35.8* 29.7*  MCV 82.4 82.1 83.4 83.4  PLT 483* 429* 451* 370    Basic Metabolic Panel: Recent Labs  Lab 07/03/21 0342 07/06/21 0159 07/08/21 0113  NA 141 142 139  K 3.6 3.6 4.1  CL 103 104 105  CO2 28 28 26   GLUCOSE 223* 155* 239*  BUN 9 9 5*  CREATININE 0.53 0.64 0.63  CALCIUM 8.8* 8.8* 9.0    GFR: Estimated Creatinine Clearance: 87.6 mL/min (by C-G formula based on SCr of 0.63 mg/dL).  CBG: Recent Labs  Lab 07/08/21 0856 07/08/21 1234 07/08/21 1755 07/08/21 2114 07/09/21 0630  GLUCAP 295* 293* 301* 273* 244*      Recent Results (from the past 240  hour(s))  Culture, Respiratory w Gram Stain     Status: None   Collection Time: 06/30/21  5:15 PM   Specimen: Tracheal Aspirate; Respiratory  Result Value Ref Range  Status   Specimen Description   Final    TRACHEAL ASPIRATE Performed at [facility], [address], [place], [place] [address]    Special Requests   Final    NONE Performed at [facility], [address] [place], [place] [address]    Gram Stain   Final    FEW SQUAMOUS EPITHELIAL CELLS PRESENT MODERATE WBC PRESENT, PREDOMINANTLY MONONUCLEAR MODERATE GRAM POSITIVE COCCI FEW GRAM NEGATIVE RODS    Culture   Final    FEW Normal respiratory flora-no Staph aureus or Pseudomonas seen Performed at [facility], [place] [address] [place], [place] [address]    Report Status 07/03/2021 FINAL  Final      Radiology Studies: VAS [place] TRANSCRANIAL DOPPLER W BUBBLES  Result Date: 07/07/2021  Transcranial Doppler with Bubble Patient Name:  KHYLIE SCHARA  Date of Exam:   07/07/2021 Medical Rec #: [id number]       Accession #:    [id number] Date of Birth: Jan 12, 1961       Patient Gender: F Patient Age:   72 years Exam Location:  [facility] Procedure:      VAS [place] TRANSCRANIAL DOPPLER W BUBBLES Referring Phys: PRAMOD SETHI --------------------------------------------------------------------------------  Indications: Stroke. Comparison Study: no prior Performing Technologist: Archie Patten RVS  Examination Guidelines: A complete evaluation includes B-mode imaging, spectral Doppler, color Doppler, and power Doppler as needed of all accessible portions of each vessel. Bilateral testing is considered an integral part of a complete examination. Limited examinations for reoccurring indications may be performed as noted.  Summary: No HITS at rest or during Valsalva. Negative transcranial Doppler Bubble study with no evidence of right to left intracardiac communication.  A vascular evaluation was performed. The right middle cerebral artery was studied. An IV was inserted into the patient's left forearm . Verbal informed consent was obtained.  Negative TCD Bubble study with no  evidence of right to left shunt *See table(s) above for TCD measurements and observations.  Diagnosing physician: Antony Contras MD Electronically signed by Antony Contras MD on 07/07/2021 at 2:06:49 PM.    Final      LOS: 9 days   Bonnielee Haff, MD [facility]  07/09/2021, 9:23 AM

## 2021-07-10 LAB — CBC
HCT: 31 % — ABNORMAL LOW (ref 36.0–46.0)
Hemoglobin: 9 g/dL — ABNORMAL LOW (ref 12.0–15.0)
MCH: 24.3 pg — ABNORMAL LOW (ref 26.0–34.0)
MCHC: 29 g/dL — ABNORMAL LOW (ref 30.0–36.0)
MCV: 83.6 fL (ref 80.0–100.0)
Platelets: 366 10*3/uL (ref 150–400)
RBC: 3.71 MIL/uL — ABNORMAL LOW (ref 3.87–5.11)
RDW: 15.9 % — ABNORMAL HIGH (ref 11.5–15.5)
WBC: 9.3 10*3/uL (ref 4.0–10.5)
nRBC: 0 % (ref 0.0–0.2)

## 2021-07-10 LAB — GLUCOSE, CAPILLARY
Glucose-Capillary: 280 mg/dL — ABNORMAL HIGH (ref 70–99)
Glucose-Capillary: 319 mg/dL — ABNORMAL HIGH (ref 70–99)

## 2021-07-10 MED ORDER — INSULIN GLARGINE-YFGN 100 UNIT/ML ~~LOC~~ SOLN
44.0000 [IU] | Freq: Two times a day (BID) | SUBCUTANEOUS | Status: DC
Start: 2021-07-10 — End: 2021-07-11
  Administered 2021-07-10 – 2021-07-11 (×2): 44 [IU] via SUBCUTANEOUS
  Filled 2021-07-10 (×3): qty 0.44

## 2021-07-10 MED ORDER — OXYCODONE-ACETAMINOPHEN 5-325 MG PO TABS
1.0000 | ORAL_TABLET | ORAL | Status: DC | PRN
  Administered 2021-07-10 – 2021-07-17 (×39): 1 via ORAL
  Filled 2021-07-10 (×40): qty 1

## 2021-07-10 MED ORDER — OXYCODONE HCL 5 MG PO TABS
5.0000 mg | ORAL_TABLET | ORAL | Status: DC | PRN
  Administered 2021-07-10 – 2021-07-17 (×39): 5 mg via ORAL
  Filled 2021-07-10 (×40): qty 1

## 2021-07-10 MED ORDER — VITAMIN B-12 100 MCG PO TABS
500.0000 ug | ORAL_TABLET | Freq: Every day | ORAL | Status: DC
  Administered 2021-07-10 – 2021-07-20 (×11): 500 ug via ORAL
  Filled 2021-07-10 (×12): qty 5

## 2021-07-10 MED ORDER — SENNOSIDES-DOCUSATE SODIUM 8.6-50 MG PO TABS
2.0000 | ORAL_TABLET | Freq: Every day | ORAL | Status: DC
  Administered 2021-07-10 – 2021-07-17 (×5): 2 via ORAL
  Filled 2021-07-10 (×6): qty 2

## 2021-07-10 NOTE — Progress Notes (Signed)
PROGRESS NOTE    Lauren Velazquez  [id number] DOB: [birth year] DOA: 06/30/2021 PCP: Denton Lank, MD   Brief Narrative/Hospital Course: Lauren Velazquez, 60 y.o. female with PMH of diabetes hypertension chronic low back pain chronic right leg pain on chronic high-dose opiates, asthma, chronic hypoxic aspiratory failure on 3.5 L nasal cannula presented to [facility] with erratic behavior on 11/5-multiple complaints of chest pain shortness of breath, agitated bizarre behavior, trying to stick her finger down the throat to make her self vomit. Refer to previous ED notes in detail patient was placed on IVC, while in ED became febrile, septic with severely altered mental status concerning for meningitis.  Other differential included viral meningitis sturgeon syndrome opioid withdrawal.  Chest x-ray UA no obvious source of infection.  She had increasing agitation refractory to benzodiazepine then subsequently sedated and intubated and had LP done.  MRI 11/7-showed acute infarct.  Was successfully extubated 11/12. 11/15 transferred to [place] On 11/16 transferred to the floor intermittently drowsy throughout the day then had an episode of complete unresponsiveness with foaming and posturing, acute hypotension, was intubated emergently and transferred to ICU seen by neurology given Keppra and transferred to [facility] for continuous EEG 11/22 to [place]  Significant event 06/20/21- Admit to ICU  with suspected meningitis s/p mechanical intubation for airway protection in the setting of increased sedation needs and agitation. S/p LP. ID consult. Patient started empirically on vancomycin, ceftriaxone, ampicillin and acyclovir.  ID consult. 06/21/21- MRI w/out contrast shows acute infarcts (embolic vs. Hypotensive). Neuro, and psych consulted.  TTE showed normal LVEF, grade I diastolic dysfunction, no sig valvular abnormalities and no intracardiac clot.  rEEG showed moderate diffuse slowing with subtle superimposed right  focal slowing without epileptiform abnormalities 06/22/21-persistent agitation off of sedation.  Started Depakene for mood stabilization and cyproheptadine for potential serotonergic symptoms. 06/23/21: Remains on the vent and sedated. 06/24/21: Remains on the vent. Neuro eval recs central embolic suspected.  Start ASA 81mg  daily + plavix 75mg  daily x21 days f/b ASA 81mg  daily after that.  Consider TEE 06/25/21: Remains on the vent and sedation.  Status post TEE negative for thrombus or vegetation. 06/26/21: S/p extubation now IVC with sitter at the bedside 06/27/21: Stable on [place] @2L  with fluctuating mental status. Sitter at bedside 06/28/21: Restart home dose of oxycodone for concern of possible withdrawal. EEG per neuro r/o seizure activity. Patient stable, able to be transferred to step down care.  06/29/21: Transferred to Med/surg 06/30/21: Rapid response called for unresponsiveness and hypotension.  Upon arrival to ICU concern for seizure activity.  Intubated for airway protection.  Transferred to [facility] for continuous EEG, PCCM admit 11/17 no further seizures. More scattered punctate infarcts on MRI compared to 11/7  11/18 extubated  11/19 had 2 witnessed seizures, loaded with Keppra, on EEG monitoring    Subjective: Patient continues to complain of back pain radiating down to her right leg which is chronic for her.  Also has right leg weakness from acute stroke.  Says that her vaginal bleeding has almost subsided.     Assessment & Plan:  Acute metabolic encephalopathy Reason for encephalopathy was thought to be medication induced. Patient was placed on Seroquel 50 mg twice a day.   She is back to baseline now.    Chronic pain on opiates and Neurontin Home medication list was reviewed.  It looks like she takes Percocet 10/325 1 tablet every 5 hours as needed for pain.  Also noted to be on Neurontin 900 mg 3  times a day.  There is also tramadol listed on her home medication list.    Complains of back pain and right leg pain which is chronic.  Recently received steroid injection to the back.   Pain does not seem to be adequately controlled.  We will change her frequency to every 4 as needed.  Bowel regimen. Gabapentin being continued at a lower dose of 600 mg 3 times a day.    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  Duplex shows left  leg with age indeterminate DVT on 07/02/20.  Neurology following.  Completed a stroke work-up echo shows EF 60 to 65%, TEE no shunt, LDL 133, HbA1c 8.2. On asa 81 mg, Zetia.  Not on statins due to intolerance.  Continue PT OT.  Seizure episode:  Continue Keppra and Vimpat as per neurology.  Continue seizure precaution, no driving x6 months.  Remains seizure-free.  DVT involving left lower extremity, age indeterminate versus chronic  Case was discussed with hematology (Dr. Benay Spice) by previous rounding physician.  They recommended repeating Doppler study and to initiate anticoagulation if positive.  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  Will benefit from 3 months of anticoagulation.   Started on rivaroxaban.  To be tolerating well for the most part.  Continue for now.    Vaginal bleeding postmenopausal Underwent a transabdominal ultrasound.  No fibroids were noted.  Cystic lesion of the left ovary or adnexa was noted. GYN consultation in the outpatient setting was recommended.   She is not interested in undergoing transvaginal ultrasound.  She would prefer to follow-up with her GYN in [place]. Some drop in hemoglobin was noted yesterday.  She continued to have vaginal bleeding.  Discussed with her GYN, Dr. Ilda Basset who recommends a pad count while she is here and also recommends Megace 40 mg twice a day. This was initiated yesterday.  Bleeding appears to be subsiding.  Patient not being compliant with wearing a pad.  Hemoglobin noted to be stable  this morning. She will need to follow-up with her GYN in [place].  Normocytic anemia/iron deficiency/folic acid deficiency Noted to have longstanding history of anemia.  Reason for this is not clear.  Hemoglobin did drop from 10.2-8.6 yesterday.  Anemia panel suggested iron deficiency.  She was given iron infusion.  Hemoglobin 9.0 today. Also noted to have Foley cath deficiency as well as low B12 level.  Started on folic acid and [id number].  Will need iron supplementation at discharge.   Some of the drop in hemoglobin could also be from her vaginal bleeding as discussed above.  Acute on chronic hypoxic respiratory failure extubated 11/18 OSA Chronic restrictive lung disease post COVID Possible aspiration event Reports using 3.5 L nocturnal oxygen at home which is her current setting.  Followed by [facility]. Completed Unasyn for aspiration.  Respiratory status is stable.  Essential hypertension Currently on amlodipine and lisinopril.  Blood pressure reasonably well controlled for the most part.  Occasional high readings noted.  Continue to monitor for now.  Pain is likely contributing to high blood pressure readings.  Mild to moderate MR outpatient follow-up  Diabetes mellitus type 2, poorly controlled with hyperglycemia  HbA1c 9.1. At home she is on Lantus insulin 70 units twice a day along with the NovoLog sliding scale basis.   Currently on glargine insulin.  Continue to adjust glargine dose to achieve better glycemic control.    Dysphagia  seen by speech on dysphagia 3 diet.  Urinary retention  Foley was discontinued on 11/23.  She has been able to void on her own.  Patient is on bethanechol.  Will discontinue at discharge.  Physical deconditioning/debility: Continue PT OT has suggested inpatient rehab TOC on consult.  [facility] is outside of the network.   Patient unsure whether she wants to go to rehab or not.  Apparently her husband has dementia which concerns her.  She will  make a final decision in the next 24 to 48 hours.    Class III Obesity: Estimated body mass index is 45.57 kg/m as calculated from the following:   Height as of this encounter: 5\' 1"  (1.549 m).   Weight as of this encounter: 109.4 kg.   DVT prophylaxis: Subcutaneous heparin Code Status:   Code Status: Full Code Family Communication: Discussed with patient.  No family at bedside.  Status is: Inpatient Remains inpatient appropriate because: For ongoing management of deconditioning debility Disposition: Currently not  medically stable for discharge. Anticipated Disposition: Inpatient rehabilitation versus home with home health  Objective: Vitals last 24 hrs: Vitals:   07/09/21 2037 07/09/21 2346 07/10/21 0300 07/10/21 0734  BP: (!) 172/72 140/70 138/77 (!) 163/71  Pulse:  88 78 83  Resp:    17  Temp:  98.3 F (36.8 C) 97.8 F (36.6 C) 98 F (36.7 C)  TempSrc:  Oral Oral Oral  SpO2:  93% 99% 100%  Weight:      Height:       Weight change:   Intake/Output Summary (Last 24 hours) at 07/10/2021 1026 Last data filed at 07/10/2021 0000 Gross per 24 hour  Intake 752.9 ml  Output --  Net 752.9 ml    Net IO Since Admission: -2,160.28 mL [07/10/21 1026]    Physical Examination:  General appearance: Awake alert.  In no distress Resp: Clear to auscultation bilaterally.  Normal effort Cardio: S1-S2 is normal regular.  No S3-S4.  No rubs murmurs or bruit GI: Abdomen is soft.  Nontender nondistended.  Bowel sounds are present normal.  No masses organomegaly Extremities: No edema.   Neurologic: Alert and oriented x3.  Right lower extremity weakness noted as before.      Medications reviewed:  Scheduled Meds:  amLODipine  10 mg Oral Daily   bethanechol  10 mg Oral TID   budesonide  1 mg Nebulization BID   ezetimibe  10 mg Oral Daily   feeding supplement (GLUCERNA SHAKE)  237 mL Oral TID BM   folic acid  1 mg Oral Daily   gabapentin  600 mg Oral TID   insulin aspart   0-20 Units Subcutaneous TID WC   insulin aspart  0-5 Units Subcutaneous QHS   insulin aspart  4 Units Subcutaneous TID WC   insulin glargine-yfgn  40 Units Subcutaneous BID   lacosamide  100 mg Oral BID   levETIRAcetam  1,000 mg Oral BID   lisinopril  40 mg Oral Daily   megestrol  40 mg Oral BID   QUEtiapine  50 mg Oral BID   Rivaroxaban  15 mg Oral BID WC   Followed by   07/12/21 ON 07/28/2021] rivaroxaban  20 mg Oral Q supper   sodium chloride flush  10-40 mL Intracatheter Q12H   umeclidinium-vilanterol  1 puff Inhalation Daily   Continuous Infusions:  sodium chloride Stopped (07/05/21 1206)   ferric gluconate (FERRLECIT) IVPB 250 mg (07/10/21 0902)   Diet Order  Diet Carb Modified Fluid consistency: Thin; Room service appropriate? Yes  Diet effective now                   Nutrition Problem: Inadequate oral intake Etiology: inability to eat Signs/Symptoms: NPO status Interventions: Glucerna shake  Weight change:   Wt Readings from Last 3 Encounters:  07/09/21 109.4 kg  06/28/21 104.8 kg  07/17/20 104.9 kg     Consultants: Seen by neurology and critical care medicine  Procedures:see note Antimicrobials:  11/6 Vancomycin >> 11/10 11/6 Ampicillin >>11/13 11/6 Ceftriaxone >>11/14 11/6 Acyclovir >>11/9 11/6 Cefepime >> x 1 dose 11/16 unasyn -11/20  Culture/Microbiology  11/6>> CSF- mild pleocytosis. Culture- no growth at 4 days. VDRL- negative.  11/6>> blood cultures- negative (no growth @4d ) 11/6>> MRSA PCR - negative  11/6>> HSV1/2- negative  11/6>> Crypotococcal Ag - negative      Component Value Date/Time   SDES  06/30/2021 1715    TRACHEAL ASPIRATE Performed at [facility], [address] [place], [place] [address]    [facility]  06/30/2021 1715    NONE Performed at [facility], [address], [place], [place] [address]    CULT  06/30/2021 1715    FEW Normal respiratory flora-no Staph aureus or Pseudomonas  seen Performed at [facility], [place] [address], [place], [place] [address]    REPTSTATUS 07/03/2021 FINAL 06/30/2021 1715    Other culture-see note  Unresulted Labs (From admission, onward)     Start     Ordered   07/08/21 0500  CBC  Daily,   R     Question:  Specimen collection method  Answer:  Lab=Lab collect   07/07/21 1034   07/02/21 1239  Factor 5 leiden  (Hypercoagulable Panel, Comprehensive (PNL))  Once,   R       Question:  Specimen collection method  Answer:  Lab=Lab collect   07/02/21 1239           Data Reviewed: I have personally reviewed following labs and imaging studies  CBC: Recent Labs  Lab 07/06/21 0159 07/08/21 0113 07/09/21 0348 07/10/21 0233  WBC 11.9* 9.6 8.8 9.3  HGB 9.3* 10.2* 8.6* 9.0*  HCT 31.7* 35.8* 29.7* 31.0*  MCV 82.1 83.4 83.4 83.6  PLT 429* 451* 370 [id number]    Basic Metabolic Panel: Recent Labs  Lab 07/06/21 0159 07/08/21 0113  NA 142 139  K 3.6 4.1  CL 104 105  CO2 28 26  GLUCOSE 155* 239*  BUN 9 5*  CREATININE 0.64 0.63  CALCIUM 8.8* 9.0    GFR: Estimated Creatinine Clearance: 87.6 mL/min (by C-G formula based on SCr of 0.63 mg/dL).  CBG: Recent Labs  Lab 07/09/21 0630 07/09/21 1216 07/09/21 1707 07/09/21 2127 07/10/21 0627  GLUCAP 244* 287* 376* 269* 280*      Recent Results (from the past 240 hour(s))  Culture, Respiratory w Gram Stain     Status: None   Collection Time: 06/30/21  5:15 PM   Specimen: Tracheal Aspirate; Respiratory  Result Value Ref Range Status   Specimen Description   Final    TRACHEAL ASPIRATE Performed at [facility], [address] [place], [place] [address]    Special Requests   Final    NONE Performed at [facility], [place], [place] [address]    Gram Stain   Final    FEW SQUAMOUS EPITHELIAL CELLS PRESENT MODERATE WBC PRESENT, PREDOMINANTLY MONONUCLEAR MODERATE GRAM POSITIVE COCCI  FEW GRAM NEGATIVE RODS    Culture   Final    FEW  Normal respiratory flora-no Staph aureus or Pseudomonas seen Performed at [facility], [place] [address], [place], [place] [address]    Report Status 07/03/2021 FINAL  Final      Radiology Studies: No results found.   LOS: 10 days   Bonnielee Haff, MD [facility]  07/10/2021, 10:26 AM

## 2021-07-10 NOTE — Progress Notes (Signed)
Physical Therapy Treatment Patient Details Name: Lauren Velazquez MRN: [id number] DOB: Mar 27, 1961 Today's Date: 07/10/2021   History of Present Illness 60 yo female presenting to [facility] ED on 11/6 with AMS with agitation; suspect viral meningitis. Intubated 11/6-11/12 for airway protection after sedation. Transfered to [facility] on 11/15. EEG negative for ongoing seizures. Rapid response called for unresponsiveness and hypotension on 11/16. 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 Intubated 11/16-11/18. PMH including HTN, T2DM, vertigo, anxiety, chronic back pain, and HLD.    PT Comments    Patient received up in recliner, pleasant and motivated this afternoon. Cognition seems much clearer than in prior sessions- had good recall of past events of PT sessions, hand placement/sequencing, and good awareness of safety and energy conservation today. Able to progress gait distance slightly, but remains limited by pain and fatigue. VSS on RA. Left up in recliner with all needs met, NT present and attending. Will continue to follow while she remains in house. Might be able to progress to HHPT if she is agreeable and family can give adequate support, would still need to discuss this with her.     Recommendations for follow up therapy are one component of a multi-disciplinary discharge planning process, led by the attending physician.  Recommendations may be updated based on patient status, additional functional criteria and insurance authorization.  Follow Up Recommendations  Acute inpatient rehab (3hours/day)     Assistance Recommended at Discharge Frequent or constant Supervision/Assistance  Equipment Recommendations  Other (comment) (TBD)    Recommendations for Other Services       Precautions / Restrictions Precautions Precautions: Fall Precaution Comments: vaginal bleeding (MD wants pads counted) Restrictions Weight  Bearing Restrictions: No     Mobility  Bed Mobility               General bed mobility comments: Pt received and left in chair    Transfers Overall transfer level: Needs assistance Equipment used: Rolling walker (2 wheels) Transfers: Sit to/from Stand Sit to Stand: Supervision           General transfer comment: S for safety, good carryover of hand placement from previous sessions    Ambulation/Gait Ambulation/Gait assistance: Min guard Gait Distance (Feet): 65 Feet Assistive device: Rolling walker (2 wheels) Gait Pattern/deviations: Step-to pattern;Decreased step length - right;Decreased step length - left;Trunk flexed Gait velocity: decreased     General Gait Details: safety with and use of RW continue to improve, able to independently pace activity and make judgements on how far was safe to walk without chair follow; VSS on RA   Stairs             Wheelchair Mobility    Modified Rankin (Stroke Patients Only)       Balance Overall balance assessment: Needs assistance Sitting-balance support: Bilateral upper extremity supported;Feet supported Sitting balance-Leahy Scale: Good     Standing balance support: Bilateral upper extremity supported;During functional activity;Reliant on assistive device for balance Standing balance-Leahy Scale: Poor Standing balance comment: RW for dynamic tasks, needs external support for short distances without RW but unsteady with only U UE support                            Cognition Arousal/Alertness: Awake/alert Behavior During Therapy: WFL for tasks assessed/performed Overall Cognitive Status: Within Functional Limits for tasks assessed  Current Attention Level: Selective   Following Commands: Follows one step commands consistently;Follows multi-step commands inconsistently Safety/Judgement: Decreased awareness of deficits;Decreased awareness of safety Awareness:  Emergent Problem Solving: Slow processing;Requires verbal cues General Comments: cognition seems much improved today- able to accurately recall events of previous sessions and had great safety awareness with hand placement, also demonstrated good judgement in terms of what distance was safe to walk in terms of activity tolerance without chair follow        Exercises      General Comments        Pertinent Vitals/Pain Pain Assessment: Faces Faces Pain Scale: Hurts little more Pain Location: Sciatica, RLE Pain Descriptors / Indicators: Aching;Discomfort Pain Intervention(s): Limited activity within patient's tolerance;Monitored during session    Home Living                          Prior Function            PT Goals (current goals can now be found in the care plan section) Acute Rehab PT Goals Patient Stated Goal: did not state PT Goal Formulation: With patient Time For Goal Achievement: 07/16/21 Potential to Achieve Goals: Fair Progress towards PT goals: Progressing toward goals    Frequency    Min 4X/week      PT Plan Current plan remains appropriate    Co-evaluation              AM-PAC PT "6 Clicks" Mobility   Outcome Measure  Help needed turning from your back to your side while in a flat bed without using bedrails?: A Little Help needed moving from lying on your back to sitting on the side of a flat bed without using bedrails?: A Little Help needed moving to and from a bed to a chair (including a wheelchair)?: A Little Help needed standing up from a chair using your arms (e.g., wheelchair or bedside chair)?: A Little Help needed to walk in hospital room?: A Little Help needed climbing 3-5 steps with a railing? : A Lot 6 Click Score: 17    End of Session Equipment Utilized During Treatment: Gait belt Activity Tolerance: Patient tolerated treatment well Patient left: in chair;with call bell/phone within reach;Other (comment) (NT present and  attending) Nurse Communication: Mobility status PT Visit Diagnosis: Unsteadiness on feet (R26.81);Muscle weakness (generalized) (M62.81);Other abnormalities of gait and mobility (R26.89);Other symptoms and signs involving the nervous system (R29.898)     Time: 6122-4497 PT Time Calculation (min) (ACUTE ONLY): 15 min  Charges:  $Gait Training: 8-22 mins                    Windell Norfolk, DPT, PN2   Supplemental Physical Therapist [place]    Pager [phone] Acute Rehab Office [phone]

## 2021-07-10 NOTE — Discharge Instructions (Signed)
Information on my medicine - XARELTO (rivaroxaban)  This medication education was reviewed with me or my healthcare representative as part of my discharge preparation. WHY WAS XARELTO PRESCRIBED FOR YOU? Xarelto was prescribed to treat blood clots that may have been found in the veins of your legs (deep vein thrombosis) or in your lungs (pulmonary embolism) and to reduce the risk of them occurring again.  What do you need to know about Xarelto? The starting dose is one 15 mg tablet taken TWICE daily with food for the FIRST 21 DAYS then on 07/28/21 the dose is changed to one 20 mg tablet taken ONCE A DAY with your evening meal.  DO NOT stop taking Xarelto without talking to the health care provider who prescribed the medication.  Refill your prescription for 20 mg tablets before you run out.  After discharge, you should have regular check-up appointments with your healthcare provider that is prescribing your Xarelto.  In the future your dose may need to be changed if your kidney function changes by a significant amount.  What do you do if you miss a dose? If you are taking Xarelto TWICE DAILY and you miss a dose, take it as soon as you remember. You may take two 15 mg tablets (total 30 mg) at the same time then resume your regularly scheduled 15 mg twice daily the next day.  If you are taking Xarelto ONCE DAILY and you miss a dose, take it as soon as you remember on the same day then continue your regularly scheduled once daily regimen the next day. Do not take two doses of Xarelto at the same time.   Important Safety Information Xarelto is a blood thinner medicine that can cause bleeding. You should call your healthcare provider right away if you experience any of the following: Bleeding from an injury or your nose that does not stop. Unusual colored urine (red or dark brown) or unusual colored stools (red or black). Unusual bruising for unknown reasons. A serious fall or if you hit  your head (even if there is no bleeding).  Some medicines may interact with Xarelto and might increase your risk of bleeding while on Xarelto. To help avoid this, consult your healthcare provider or pharmacist prior to using any new prescription or non-prescription medications, including herbals, vitamins, non-steroidal anti-inflammatory drugs (NSAIDs) and supplements.  This website has more information on Xarelto: VisitDestination.com.br.

## 2021-07-10 NOTE — Progress Notes (Signed)
PT Cancellation Note  Patient Details Name: Lauren Velazquez MRN: [id number] DOB: 1961-01-13   Cancelled Treatment:    Reason Eval/Treat Not Completed: Patient declined, no reason specified Currently eating lunch, politely declines- will attempt to come back later if time/schedule allow, otherwise will try to get her seen next date of service.   Madelaine Etienne, DPT, PN2   Supplemental Physical Therapist [facility]    Pager [phone] Acute Rehab Office [phone]

## 2021-07-11 LAB — CBC
HCT: 31.7 % — ABNORMAL LOW (ref 36.0–46.0)
Hemoglobin: 9.3 g/dL — ABNORMAL LOW (ref 12.0–15.0)
MCH: 24.2 pg — ABNORMAL LOW (ref 26.0–34.0)
MCHC: 29.3 g/dL — ABNORMAL LOW (ref 30.0–36.0)
MCV: 82.6 fL (ref 80.0–100.0)
Platelets: 415 10*3/uL — ABNORMAL HIGH (ref 150–400)
RBC: 3.84 MIL/uL — ABNORMAL LOW (ref 3.87–5.11)
RDW: 16.2 % — ABNORMAL HIGH (ref 11.5–15.5)
WBC: 10.3 10*3/uL (ref 4.0–10.5)
nRBC: 0 % (ref 0.0–0.2)

## 2021-07-11 LAB — GLUCOSE, CAPILLARY
Glucose-Capillary: 246 mg/dL — ABNORMAL HIGH (ref 70–99)
Glucose-Capillary: 334 mg/dL — ABNORMAL HIGH (ref 70–99)

## 2021-07-11 MED ORDER — INSULIN GLARGINE-YFGN 100 UNIT/ML ~~LOC~~ SOLN
48.0000 [IU] | Freq: Two times a day (BID) | SUBCUTANEOUS | Status: DC
Start: 2021-07-11 — End: 2021-07-12
  Administered 2021-07-11 – 2021-07-12 (×2): 48 [IU] via SUBCUTANEOUS
  Filled 2021-07-11 (×3): qty 0.48

## 2021-07-11 NOTE — Progress Notes (Signed)
PROGRESS NOTE    Lauren Velazquez  [id number] DOB: 1961/02/10 DOA: 06/30/2021 PCP: Denton Lank, MD   Brief Narrative/Hospital Course: Lauren Velazquez, 60 y.o. female with PMH of diabetes hypertension chronic low back pain chronic right leg pain on chronic high-dose opiates, asthma, chronic hypoxic aspiratory failure on 3.5 L nasal cannula presented to [facility] with erratic behavior on 11/5-multiple complaints of chest pain shortness of breath, agitated bizarre behavior, trying to stick her finger down the throat to make her self vomit. Refer to previous ED notes in detail patient was placed on IVC, while in ED became febrile, septic with severely altered mental status concerning for meningitis.  Other differential included viral meningitis sturgeon syndrome opioid withdrawal.  Chest x-ray UA no obvious source of infection.  She had increasing agitation refractory to benzodiazepine then subsequently sedated and intubated and had LP done.  MRI 11/7-showed acute infarct.  Was successfully extubated 11/12. 11/15 transferred to [place] On 11/16 transferred to the floor intermittently drowsy throughout the day then had an episode of complete unresponsiveness with foaming and posturing, acute hypotension, was intubated emergently and transferred to ICU seen by neurology given Keppra and transferred to [facility] for continuous EEG 11/22 to [place]  Significant event 06/20/21- Admit to ICU  with suspected meningitis s/p mechanical intubation for airway protection in the setting of increased sedation needs and agitation. S/p LP. ID consult. Patient started empirically on vancomycin, ceftriaxone, ampicillin and acyclovir.  ID consult. 06/21/21- MRI w/out contrast shows acute infarcts (embolic vs. Hypotensive). Neuro, and psych consulted.  TTE showed normal LVEF, grade I diastolic dysfunction, no sig valvular abnormalities and no intracardiac clot.  rEEG showed moderate diffuse slowing with subtle superimposed right  focal slowing without epileptiform abnormalities 06/22/21-persistent agitation off of sedation.  Started Depakene for mood stabilization and cyproheptadine for potential serotonergic symptoms. 06/23/21: Remains on the vent and sedated. 06/24/21: Remains on the vent. Neuro eval recs central embolic suspected.  Start ASA 81mg  daily + plavix 75mg  daily x21 days f/b ASA 81mg  daily after that.  Consider TEE 06/25/21: Remains on the vent and sedation.  Status post TEE negative for thrombus or vegetation. 06/26/21: S/p extubation now IVC with sitter at the bedside 06/27/21: Stable on [place] @2L  with fluctuating mental status. Sitter at bedside 06/28/21: Restart home dose of oxycodone for concern of possible withdrawal. EEG per neuro r/o seizure activity. Patient stable, able to be transferred to step down care.  06/29/21: Transferred to Med/surg 06/30/21: Rapid response called for unresponsiveness and hypotension.  Upon arrival to ICU concern for seizure activity.  Intubated for airway protection.  Transferred to [facility] for continuous EEG, PCCM admit 11/17 no further seizures. More scattered punctate infarcts on MRI compared to 11/7  11/18 extubated  11/19 had 2 witnessed seizures, loaded with Keppra, on EEG monitoring    Subjective: Patient mentions that overall she is feeling better.  Denies any chest pain shortness of breath.  Back pain is better controlled this morning.  Right leg weakness persists.  Vaginal bleeding has stopped.    Assessment & Plan:  Acute metabolic encephalopathy Reason for encephalopathy was thought to be medication induced. Patient was placed on Seroquel 50 mg twice a day.   She is back to baseline now.    Chronic pain on opiates and Neurontin Home medication list was reviewed.  It looks like she takes Percocet 10/325 1 tablet every 5 hours as needed for pain.  Also noted to be on Neurontin 900 mg 3 times a day.  There is also tramadol listed on her home medication list.    Complains of back pain and right leg pain which is chronic.  Recently received steroid injection to the back.   Her narcotics were changed over to every 4 hours as needed.. Gabapentin being continued at a lower dose of 600 mg 3 times a day.   Pain is better controlled.  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  Duplex shows left leg with age indeterminate DVT on 07/02/20.  Neurology following.  Completed a stroke work-up echo shows EF 60 to 65%, TEE no shunt, LDL 133, HbA1c 8.2. On asa 81 mg, Zetia.  Not on statins due to intolerance.  Continue PT OT.  Seizure episode:  Continue Keppra and Vimpat as per neurology.  Continue seizure precaution, no driving x6 months.  Remains seizure-free.  DVT involving left lower extremity, age indeterminate versus chronic  Case was discussed with hematology (Dr. Truett Perna) by previous rounding physician.  They recommended repeating Doppler study and to initiate anticoagulation if positive.  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  Will benefit from 3 months of anticoagulation.   Started on rivaroxaban.  She is tolerating it well without any significant bleeding complications.  Vaginal bleeding has subsided.  Vaginal bleeding postmenopausal Underwent a transabdominal ultrasound.  No fibroids were noted.  Cystic lesion of the left ovary or adnexa was noted. GYN consultation in the outpatient setting was recommended.   She is not interested in undergoing transvaginal ultrasound.  She would prefer to follow-up with her GYN in [place]. After discussions with GYN, Dr. Vergie Living patient was started on Megace 40 mg twice daily.  Bleeding appears to have subsided.  Hemoglobin stable for the most part.  Continue Megace at current dose to patient has had opportunity to follow-up with her gynecologist in [place].    Normocytic anemia/iron deficiency/folic acid deficiency Noted  to have longstanding history of anemia.  Reason for this is not clear.  Anemia panel suggested iron deficiency.  She was given iron infusion.   Also noted to have Foley cath deficiency as well as low B12 level.  Started on folic acid and B12.  Will need iron supplementation at discharge.   Some of the drop in hemoglobin could also be from her vaginal bleeding as discussed above. Hemoglobin stable over the last 48 hours.  Acute on chronic hypoxic respiratory failure extubated 11/18 OSA Chronic restrictive lung disease post COVID Possible aspiration event Reports using 3.5 L nocturnal oxygen at home which is her current setting.  Followed by [facility]. Completed Unasyn for aspiration.  Respiratory status is stable.  Essential hypertension Currently on amlodipine and lisinopril.  Blood pressure reasonably well controlled for the most part.  Occasional high readings noted.  Continue to monitor for now.  Pain is likely contributing to high blood pressure readings.  Mild to moderate MR outpatient follow-up  Diabetes mellitus type 2, poorly controlled with hyperglycemia  HbA1c 9.1. At home she is on Lantus insulin 70 units twice a day along with the NovoLog sliding scale basis.   Currently on glargine insulin.  CBGs remain poorly controlled.  Will further adjust dose of glargine today.   Dysphagia seen by speech on dysphagia 3 diet.  Urinary retention  Foley was discontinued on 11/23.  She has been able to void on her own.  Patient is on bethanechol.  Will discontinue at discharge.  Physical deconditioning/debility: Continue  PT OT has suggested inpatient rehab TOC on consult.  [facility] is outside of the network.   Patient unsure whether she wants to go to rehab or not.  Apparently her husband has dementia which concerns her.  She will make a final decision by tomorrow.  Class III Obesity: Estimated body mass index is 45.57 kg/m as calculated from the following:   Height as of this  encounter: 5\' 1"  (1.549 m).   Weight as of this encounter: 109.4 kg.   DVT prophylaxis: Subcutaneous heparin Code Status:   Code Status: Full Code Family Communication: Discussed with patient.  No family at bedside.  Status is: Inpatient Remains inpatient appropriate because: For ongoing management of deconditioning debility Disposition: Currently not  medically stable for discharge. Anticipated Disposition: Inpatient rehabilitation versus home with home health  Objective: Vitals last 24 hrs: Vitals:   07/10/21 1949 07/11/21 0013 07/11/21 0439 07/11/21 0759  BP: (!) 153/74 124/90 135/73 (!) 149/78  Pulse: 97 100 89 84  Resp: 17 18 20 18   Temp: 98.4 F (36.9 C) 98.4 F (36.9 C) 98.2 F (36.8 C) 98 F (36.7 C)  TempSrc: Oral Oral Oral Oral  SpO2: 97% 96% 94% 100%  Weight:      Height:       Weight change:   Intake/Output Summary (Last 24 hours) at 07/11/2021 0912 Last data filed at 07/10/2021 2202 Gross per 24 hour  Intake 510 ml  Output 2 ml  Net 508 ml    Net IO Since Admission: -1,292.28 mL [07/11/21 0912]    Physical Examination:  General appearance: Awake alert.  In no distress Resp: Clear to auscultation bilaterally.  Normal effort Cardio: S1-S2 is normal regular.  No S3-S4.  No rubs murmurs or bruit GI: Abdomen is soft.  Nontender nondistended.  Bowel sounds are present normal.  No masses organomegaly Extremities: No edema.   Neurologic: Right leg weakness from stroke.  Otherwise alert and oriented x3 and without any other focal neurological deficits.      Medications reviewed:  Scheduled Meds:  amLODipine  10 mg Oral Daily   bethanechol  10 mg Oral TID   budesonide  1 mg Nebulization BID   ezetimibe  10 mg Oral Daily   feeding supplement (GLUCERNA SHAKE)  237 mL Oral TID BM   folic acid  1 mg Oral Daily   gabapentin  600 mg Oral TID   insulin aspart  0-20 Units Subcutaneous TID WC   insulin aspart  0-5 Units Subcutaneous QHS   insulin aspart  4  Units Subcutaneous TID WC   insulin glargine-yfgn  44 Units Subcutaneous BID   lacosamide  100 mg Oral BID   levETIRAcetam  1,000 mg Oral BID   lisinopril  40 mg Oral Daily   megestrol  40 mg Oral BID   QUEtiapine  50 mg Oral BID   Rivaroxaban  15 mg Oral BID WC   Followed by   2203 ON 07/28/2021] rivaroxaban  20 mg Oral Q supper   senna-docusate  2 tablet Oral QHS   sodium chloride flush  10-40 mL Intracatheter Q12H   umeclidinium-vilanterol  1 puff Inhalation Daily   vitamin B-12  500 mcg Oral Daily   Continuous Infusions:  sodium chloride Stopped (07/05/21 1206)   Diet Order             Diet Carb Modified Fluid consistency: Thin; Room service appropriate? Yes  Diet effective now  Nutrition Problem: Inadequate oral intake Etiology: inability to eat Signs/Symptoms: NPO status Interventions: Glucerna shake  Weight change:   Wt Readings from Last 3 Encounters:  07/09/21 109.4 kg  06/28/21 104.8 kg  07/17/20 104.9 kg     Consultants: Seen by neurology and critical care medicine  Procedures:see note Antimicrobials:  11/6 Vancomycin >> 11/10 11/6 Ampicillin >>11/13 11/6 Ceftriaxone >>11/14 11/6 Acyclovir >>11/9 11/6 Cefepime >> x 1 dose 11/16 unasyn -11/20  Culture/Microbiology  11/6>> CSF- mild pleocytosis. Culture- no growth at 4 days. VDRL- negative.  11/6>> blood cultures- negative (no growth @4d ) 11/6>> MRSA PCR - negative  11/6>> HSV1/2- negative  11/6>> Crypotococcal Ag - negative      Component Value Date/Time   SDES  06/30/2021 1715    TRACHEAL ASPIRATE Performed at [facility], [address]el[address] Bhat [place], [place] [address]    [facility]  06/30/2021 1715    NONE Performed at [facility], [address], [place], [place] [address]    CULT  06/30/2021 1715    FEW Normal respiratory flora-no Staph aureus or Pseudomonas seen Performed at [facility], [address] [place], [place] [address]     REPTSTATUS 07/03/2021 FINAL 06/30/2021 1715    Other culture-see note  Unresulted Labs (From admission, onward)     Start     Ordered   07/08/21 0500  CBC  Daily,   R     Question:  Specimen collection method  Answer:  Lab=Lab collect   07/07/21 1034   07/02/21 1239  Factor 5 leiden  (Hypercoagulable Panel, Comprehensive (PNL))  Once,   R       Question:  Specimen collection method  Answer:  Lab=Lab collect   07/02/21 1239           Data Reviewed: I have personally reviewed following labs and imaging studies  CBC: Recent Labs  Lab 07/06/21 0159 07/08/21 0113 07/09/21 0348 07/10/21 0233 07/11/21 0158  WBC 11.9* 9.6 8.8 9.3 10.3  HGB 9.3* 10.2* 8.6* 9.0* 9.3*  HCT 31.7* 35.8* 29.7* 31.0* 31.7*  MCV 82.1 83.4 83.4 83.6 82.6  PLT 429* 451* 370 366 415*    Basic Metabolic Panel: Recent Labs  Lab 07/06/21 0159 07/08/21 0113  NA 142 139  K 3.6 4.1  CL 104 105  CO2 28 26  GLUCOSE 155* 239*  BUN 9 5*  CREATININE 0.64 0.63  CALCIUM 8.8* 9.0    GFR: Estimated Creatinine Clearance: 87.6 mL/min (by C-G formula based on SCr of 0.63 mg/dL).  CBG: Recent Labs  Lab 07/09/21 1707 07/09/21 2127 07/10/21 0627 07/10/21 1630 07/11/21 0631  GLUCAP 376* 269* 280* 319* 246*       Radiology Studies: No results found.   LOS: 11 days   Bonnielee Haff, MD [facility]  07/11/2021, 9:12 AM

## 2021-07-12 ENCOUNTER — Other Ambulatory Visit (HOSPITAL_COMMUNITY): Payer: Self-pay

## 2021-07-12 DIAGNOSIS — E1165 Type 2 diabetes mellitus with hyperglycemia: Secondary | ICD-10-CM

## 2021-07-12 LAB — GLUCOSE, CAPILLARY
Glucose-Capillary: 309 mg/dL — ABNORMAL HIGH (ref 70–99)
Glucose-Capillary: 203 mg/dL — ABNORMAL HIGH (ref 70–99)
Glucose-Capillary: 277 mg/dL — ABNORMAL HIGH (ref 70–99)
Glucose-Capillary: 286 mg/dL — ABNORMAL HIGH (ref 70–99)
Glucose-Capillary: 263 mg/dL — ABNORMAL HIGH (ref 70–99)
Glucose-Capillary: 161 mg/dL — ABNORMAL HIGH (ref 70–99)
Glucose-Capillary: 365 mg/dL — ABNORMAL HIGH (ref 70–99)
Glucose-Capillary: 256 mg/dL — ABNORMAL HIGH (ref 70–99)

## 2021-07-12 LAB — CBC
HCT: 31.9 % — ABNORMAL LOW (ref 36.0–46.0)
Hemoglobin: 9.3 g/dL — ABNORMAL LOW (ref 12.0–15.0)
MCH: 24.3 pg — ABNORMAL LOW (ref 26.0–34.0)
MCHC: 29.2 g/dL — ABNORMAL LOW (ref 30.0–36.0)
MCV: 83.3 fL (ref 80.0–100.0)
Platelets: 385 10*3/uL (ref 150–400)
RBC: 3.83 MIL/uL — ABNORMAL LOW (ref 3.87–5.11)
RDW: 16.8 % — ABNORMAL HIGH (ref 11.5–15.5)
WBC: 11 10*3/uL — ABNORMAL HIGH (ref 4.0–10.5)
nRBC: 0 % (ref 0.0–0.2)

## 2021-07-12 LAB — BASIC METABOLIC PANEL
Anion gap: 7 (ref 5–15)
BUN: 9 mg/dL (ref 6–20)
CO2: 26 mmol/L (ref 22–32)
Calcium: 9.3 mg/dL (ref 8.9–10.3)
Chloride: 106 mmol/L (ref 98–111)
Creatinine, Ser: 0.71 mg/dL (ref 0.44–1.00)
GFR, Estimated: >60 mL/min (ref >60)
Glucose, Bld: 311 mg/dL — ABNORMAL HIGH (ref 70–99)
Potassium: 4.3 mmol/L (ref 3.5–5.1)
Sodium: 139 mmol/L (ref 135–145)

## 2021-07-12 LAB — FACTOR 5 LEIDEN

## 2021-07-12 MED ORDER — INSULIN GLARGINE-YFGN 100 UNIT/ML ~~LOC~~ SOLN
54.0000 [IU] | Freq: Two times a day (BID) | SUBCUTANEOUS | Status: DC
Start: 2021-07-12 — End: 2021-07-12
  Filled 2021-07-12: qty 0.54

## 2021-07-12 MED ORDER — INSULIN GLARGINE-YFGN 100 UNIT/ML ~~LOC~~ SOLN
54.0000 [IU] | Freq: Two times a day (BID) | SUBCUTANEOUS | Status: DC
  Administered 2021-07-12 – 2021-07-13 (×2): 54 [IU] via SUBCUTANEOUS
  Filled 2021-07-12 (×4): qty 0.54

## 2021-07-12 MED ORDER — INSULIN ASPART 100 UNIT/ML IJ SOLN
6.0000 [IU] | Freq: Three times a day (TID) | INTRAMUSCULAR | Status: DC
Start: 2021-07-12 — End: 2021-07-13
  Administered 2021-07-12 – 2021-07-13 (×3): 6 [IU] via SUBCUTANEOUS

## 2021-07-12 NOTE — TOC Benefit Eligibility Note (Signed)
Patient Product/process development scientist completed.    The patient is currently admitted and upon discharge could be taking Xarelto Starter Pack.  The current 30 day co-pay is, $0.00.   The patient is insured through [employer] of [facility]     Roland Earl, CPhT Pharmacy Patient Advocate Specialist [facility] Patient Advocate Team Direct Number: [phone]  Fax: [phone]

## 2021-07-12 NOTE — Progress Notes (Signed)
OT Cancellation Note  Patient Details Name: Lauren Velazquez MRN: [id number] DOB: 11/20/1960   Cancelled Treatment:    Reason Eval/Treat Not Completed: Patient declined, no reason specified (Patient asked that OT attempt later today) Alfonse Flavors, OTA Acute Rehabilitation Services  Pager [phone] Office [phone]  Dewain Penning 07/12/2021, 9:27 AM

## 2021-07-12 NOTE — Progress Notes (Signed)
PROGRESS NOTE    Lauren Velazquez  [id number] DOB: November 10, 1960 DOA: 06/30/2021 PCP: Lauren Lank, MD   Brief Narrative/Hospital Course: Lauren Velazquez, 60 y.o. female with PMH of diabetes hypertension chronic low back pain chronic right leg pain on chronic high-dose opiates, asthma, chronic hypoxic aspiratory failure on 3.5 L nasal cannula presented to [facility] with erratic behavior on 11/5-multiple complaints of chest pain shortness of breath, agitated bizarre behavior, trying to stick her finger down the throat to make her self vomit. Refer to previous ED notes in detail patient was placed on IVC, while in ED became febrile, septic with severely altered mental status concerning for meningitis.  Other differential included viral meningitis sturgeon syndrome opioid withdrawal.  Chest x-ray UA no obvious source of infection.  She had increasing agitation refractory to benzodiazepine then subsequently sedated and intubated and had LP done.  MRI 11/7-showed acute infarct.  Was successfully extubated 11/12. 11/15 transferred to [place] On 11/16 transferred to the floor intermittently drowsy throughout the day then had an episode of complete unresponsiveness with foaming and posturing, acute hypotension, was intubated emergently and transferred to ICU seen by neurology given Keppra and transferred to [facility] for continuous EEG 11/22 to [place]  Significant event 06/20/21- Admit to ICU  with suspected meningitis s/p mechanical intubation for airway protection in the setting of increased sedation needs and agitation. S/p LP. ID consult. Patient started empirically on vancomycin, ceftriaxone, ampicillin and acyclovir.  ID consult. 06/21/21- MRI w/out contrast shows acute infarcts (embolic vs. Hypotensive). Neuro, and psych consulted.  TTE showed normal LVEF, grade I diastolic dysfunction, no sig valvular abnormalities and no intracardiac clot.  rEEG showed moderate diffuse slowing with subtle superimposed right  focal slowing without epileptiform abnormalities 06/22/21-persistent agitation off of sedation.  Started Depakene for mood stabilization and cyproheptadine for potential serotonergic symptoms. 06/23/21: Remains on the vent and sedated. 06/24/21: Remains on the vent. Neuro eval recs central embolic suspected.  Start ASA 81mg  daily + plavix 75mg  daily x21 days f/b ASA 81mg  daily after that.  Consider TEE 06/25/21: Remains on the vent and sedation.  Status post TEE negative for thrombus or vegetation. 06/26/21: S/p extubation now IVC with sitter at the bedside 06/27/21: Stable on [place] @2L  with fluctuating mental status. Sitter at bedside 06/28/21: Restart home dose of oxycodone for concern of possible withdrawal. EEG per neuro r/o seizure activity. Patient stable, able to be transferred to step down care.  06/29/21: Transferred to Med/surg 06/30/21: Rapid response called for unresponsiveness and hypotension.  Upon arrival to ICU concern for seizure activity.  Intubated for airway protection.  Transferred to [facility] for continuous EEG, PCCM admit 11/17 no further seizures. More scattered punctate infarcts on MRI compared to 11/7  11/18 extubated  11/19 had 2 witnessed seizures, loaded with Keppra, on EEG monitoring    Subjective: Patient states that she is feeling better.  Vaginal bleeding has stopped completely.  Back pain is reasonably better controlled.  Right leg weakness persist.     Assessment & Plan:  Acute metabolic encephalopathy Reason for encephalopathy was thought to be medication induced. Patient was placed on Seroquel 50 mg twice a day.   She is back to baseline now.    Chronic pain on opiates and Neurontin Home medication list was reviewed.  It looks like she takes Percocet 10/325 1 tablet every 5 hours as needed for pain.  Also noted to be on Neurontin 900 mg 3 times a day.  There is also tramadol listed on her  home medication list.   Complains of back pain and right leg pain which is  chronic.  Recently received steroid injection to the back.   Her narcotics were changed over to every 4 hours as needed. Gabapentin being continued at a lower dose of 600 mg 3 times a day.   Pain is better controlled.  Right lower extremity weakness is from her stroke.  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  Duplex shows left leg with age indeterminate DVT on 07/02/20.  Neurology following.  Completed a stroke work-up echo shows EF 60 to 65%, TEE no shunt, LDL 133, HbA1c 8.2. On asa 81 mg, Zetia.  Not on statins due to intolerance.  Continue PT OT.  Seizure episode:  Continue Keppra and Vimpat as per neurology.  Continue seizure precaution, no driving x6 months.  Remains seizure-free.  DVT involving left lower extremity, age indeterminate versus chronic  Case was discussed with hematology (Dr. Benay Spice) by previous rounding physician.  They recommended repeating Doppler study and to initiate anticoagulation if positive.  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  Will benefit from 3 months of anticoagulation.   Started on rivaroxaban.  She is tolerating it well without any significant bleeding complications.  Vaginal bleeding has subsided.  Vaginal bleeding postmenopausal Underwent a transabdominal ultrasound.  No fibroids were noted.  Cystic lesion of the left ovary or adnexa was noted. GYN consultation in the outpatient setting was recommended.   She is not interested in undergoing transvaginal ultrasound.  She would prefer to follow-up with her GYN in [place]. After discussions with GYN, Dr. Ilda Basset patient was started on Megace 40 mg twice daily.  Vaginal bleeding has subsided.  Hemoglobin has been stable.   Continue Megace at current dose to patient has had opportunity to follow-up with her gynecologist in [place].    Normocytic anemia/iron deficiency/folic acid deficiency Noted to have  longstanding history of anemia.  Reason for this is not clear.  Anemia panel suggested iron deficiency.  She was given iron infusion.   Also noted to have Foley cath deficiency as well as low B12 level.  Started on folic acid and [id number].  Will need iron supplementation at discharge.   Some of the drop in hemoglobin could also be from her vaginal bleeding as discussed above. Hemoglobin stable for the past 3 days.  Acute on chronic hypoxic respiratory failure extubated 11/18 OSA Chronic restrictive lung disease post COVID Possible aspiration event Reports using 3.5 L nocturnal oxygen at home which is her current setting.  Followed by [facility].  Completed Unasyn for aspiration.  Respiratory status is stable.  Essential hypertension Currently on amlodipine and lisinopril.  Blood pressure reasonably well controlled for the most part.  Occasional high readings noted.  Continue to monitor for now.  Pain is likely contributing to high blood pressure readings.  Mild to moderate MR outpatient follow-up  Diabetes mellitus type 2, poorly controlled with hyperglycemia  HbA1c 9.1. At home she is on Lantus insulin 70 units twice a day along with the NovoLog sliding scale basis.   Currently on glargine insulin.  CBGs remain poorly controlled.  We will further titrate the dose of her glargine.    Dysphagia seen by speech on dysphagia 3 diet.  Urinary retention  Foley was discontinued on 11/23.  She has been able to void on her own.  Patient is on bethanechol.  Will discontinue at discharge.  Physical deconditioning/debility: Continue PT OT has suggested inpatient rehab TOC on consult.  [facility] is outside of the network.   Over the past few days patient has been unsure about rehab but today she tells me that she is really interested in going to rehab.  We will wait for bed to become available.  Class III Obesity: Estimated body mass index is 45.57 kg/m as calculated from the following:   Height  as of this encounter: 5\' 1"  (1.549 m).   Weight as of this encounter: 109.4 kg.   DVT prophylaxis: Subcutaneous heparin Code Status:   Code Status: Full Code Family Communication: Discussed with patient.  No family at bedside.  Status is: Inpatient Remains inpatient appropriate because: For ongoing management of deconditioning debility Disposition: Stable for discharge Anticipated Disposition: Inpatient rehabilitation  Objective: Vitals last 24 hrs: Vitals:   07/11/21 2323 07/12/21 0404 07/12/21 0827 07/12/21 0835  BP: 128/70 137/72 138/72   Pulse: 90 92 89   Resp: 16 18 17    Temp: 98.1 F (36.7 C) 98.2 F (36.8 C) 98.5 F (36.9 C)   TempSrc: Oral Oral Oral   SpO2: 94% 95% 93% 98%  Weight:      Height:       Weight change:  No intake or output data in the 24 hours ending 07/12/21 1013  Net IO Since Admission: -1,292.28 mL [07/12/21 1013]    Physical Examination:  General appearance: Awake alert.  In no distress Resp: Clear to auscultation bilaterally.  Normal effort Cardio: S1-S2 is normal regular.  No S3-S4.  No rubs murmurs or bruit GI: Abdomen is soft.  Nontender nondistended.  Bowel sounds are present normal.  No masses organomegaly Extremities: No edema.   Neurologic: Alert and oriented x3.  No focal neurological deficits except for the right leg weakness as before.    Medications reviewed:  Scheduled Meds:  amLODipine  10 mg Oral Daily   bethanechol  10 mg Oral TID   budesonide  1 mg Nebulization BID   ezetimibe  10 mg Oral Daily   feeding supplement (GLUCERNA SHAKE)  237 mL Oral TID BM   folic acid  1 mg Oral Daily   gabapentin  600 mg Oral TID   insulin aspart  0-20 Units Subcutaneous TID WC   insulin aspart  0-5 Units Subcutaneous QHS   insulin aspart  4 Units Subcutaneous TID WC   insulin glargine-yfgn  48 Units Subcutaneous BID   lacosamide  100 mg Oral BID   levETIRAcetam  1,000 mg Oral BID   lisinopril  40 mg Oral Daily   megestrol  40 mg  Oral BID   QUEtiapine  50 mg Oral BID   Rivaroxaban  15 mg Oral BID WC   Followed by   07/14/21 ON 07/28/2021] rivaroxaban  20 mg Oral Q supper   senna-docusate  2 tablet Oral QHS   sodium chloride flush  10-40 mL Intracatheter Q12H   umeclidinium-vilanterol  1 puff Inhalation Daily   vitamin B-12  500 mcg Oral Daily   Continuous Infusions:  sodium chloride Stopped (07/05/21 1206)   Diet Order             Diet Carb Modified Fluid consistency: Thin; Room service appropriate? Yes  Diet effective now                   Nutrition Problem: Inadequate oral intake Etiology: inability to eat Signs/Symptoms: NPO status Interventions: Glucerna shake  Weight change:  Wt Readings from Last 3 Encounters:  07/09/21 109.4 kg  06/28/21 104.8 kg  07/17/20 104.9 kg     Consultants: Seen by neurology and critical care medicine  Procedures:see note Antimicrobials:  11/6 Vancomycin >> 11/10 11/6 Ampicillin >>11/13 11/6 Ceftriaxone >>11/14 11/6 Acyclovir >>11/9 11/6 Cefepime >> x 1 dose 11/16 unasyn -11/20  Culture/Microbiology  11/6>> CSF- mild pleocytosis. Culture- no growth at 4 days. VDRL- negative.  11/6>> blood cultures- negative (no growth @4d ) 11/6>> MRSA PCR - negative  11/6>> HSV1/2- negative  11/6>> Crypotococcal Ag - negative      Component Value Date/Time   SDES  06/30/2021 1715    TRACHEAL ASPIRATE Performed at [facility], [address] [place], [place] [address]    [facility]  06/30/2021 1715    NONE Performed at [facility], [address], [place], [place] [address]    CULT  06/30/2021 1715    FEW Normal respiratory flora-no Staph aureus or Pseudomonas seen Performed at [facility], [place] [address] [place], [place] [address]    REPTSTATUS 07/03/2021 FINAL 06/30/2021 1715    Other culture-see note  Unresulted Labs (From admission, onward)     Start     Ordered   07/02/21 1239  Factor 5 leiden  (Hypercoagulable  Panel, Comprehensive (PNL))  Once,   R       Question:  Specimen collection method  Answer:  Lab=Lab collect   07/02/21 1239           Data Reviewed: I have personally reviewed following labs and imaging studies  CBC: Recent Labs  Lab 07/08/21 0113 07/09/21 0348 07/10/21 0233 07/11/21 0158 07/12/21 0220  WBC 9.6 8.8 9.3 10.3 11.0*  HGB 10.2* 8.6* 9.0* 9.3* 9.3*  HCT 35.8* 29.7* 31.0* 31.7* 31.9*  MCV 83.4 83.4 83.6 82.6 83.3  PLT 451* 370 366 415* 0000000    Basic Metabolic Panel: Recent Labs  Lab 07/06/21 0159 07/08/21 0113 07/12/21 0220  NA 142 139 139  K 3.6 4.1 4.3  CL 104 105 106  CO2 28 26 26   GLUCOSE 155* 239* 311*  BUN 9 5* 9  CREATININE 0.64 0.63 0.71  CALCIUM 8.8* 9.0 9.3    GFR: Estimated Creatinine Clearance: 87.6 mL/min (by C-G formula based on SCr of 0.71 mg/dL).  CBG: Recent Labs  Lab 07/11/21 0631 07/11/21 1149 07/11/21 1556 07/11/21 2056 07/12/21 0546  GLUCAP 246* 277* 334* 286* 263*       Radiology Studies: No results found.   LOS: 12 days   Bonnielee Haff, MD [facility]  07/12/2021, 10:13 AM

## 2021-07-12 NOTE — Progress Notes (Signed)
Physical Therapy Treatment Patient Details Name: Lauren Velazquez MRN: [id number] DOB: 16-Aug-1960 Today's Date: 07/12/2021   History of Present Illness 60 yo female presenting to [facility] ED on 11/6 with AMS with agitation; suspect viral meningitis. Intubated 11/6-11/12 for airway protection after sedation. Transfered to [facility] on 11/15. EEG negative for ongoing seizures. Rapid response called for unresponsiveness and hypotension on 11/16. 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 Intubated 11/16-11/18. PMH including HTN, T2DM, vertigo, anxiety, chronic back pain, and HLD.    PT Comments    Pt required supervision transfers and min guard assist ambulation 30' with RW. Gait distance limited by RLE pain. Pt reports receiving pain meds just prior to PT. Pt assisted to recliner at end of session.  Pt is agreeable to rehab prior to d/c home.   Recommendations for follow up therapy are one component of a multi-disciplinary discharge planning process, led by the attending physician.  Recommendations may be updated based on patient status, additional functional criteria and insurance authorization.  Follow Up Recommendations  Acute inpatient rehab (3hours/day)     Assistance Recommended at Discharge Frequent or constant Supervision/Assistance  Equipment Recommendations  Rolling walker (2 wheels)    Recommendations for Other Services       Precautions / Restrictions Precautions Precautions: Fall     Mobility  Bed Mobility               General bed mobility comments: Pt OOB    Transfers Overall transfer level: Needs assistance Equipment used: Rolling walker (2 wheels) Transfers: Sit to/from Stand Sit to Stand: Supervision           General transfer comment: supervision for safety    Ambulation/Gait Ambulation/Gait assistance: Min guard Gait Distance (Feet): 30 Feet Assistive device: Rolling  walker (2 wheels) Gait Pattern/deviations: Step-to pattern;Step-through pattern;Decreased stride length;Antalgic Gait velocity: decreased Gait velocity interpretation: <1.31 ft/sec, indicative of household ambulator   General Gait Details: mildly unsteady gait with short stride length, cues for RW management with maneuvering in tight spaces   Stairs             Wheelchair Mobility    Modified Rankin (Stroke Patients Only) Modified Rankin (Stroke Patients Only) Pre-Morbid Rankin Score: No symptoms Modified Rankin: Moderately severe disability     Balance Overall balance assessment: Needs assistance Sitting-balance support: Feet supported;No upper extremity supported Sitting balance-Leahy Scale: Good     Standing balance support: No upper extremity supported;Bilateral upper extremity supported;During functional activity Standing balance-Leahy Scale: Poor Standing balance comment: reliant on external support                            Cognition Arousal/Alertness: Awake/alert Behavior During Therapy: WFL for tasks assessed/performed Overall Cognitive Status: Within Functional Limits for tasks assessed                                 General Comments: tearful during session regarding current situation. Pt concerned about her husband with dementia.        Exercises      General Comments        Pertinent Vitals/Pain Pain Assessment: 0-10 Pain Score: 10-Worst pain ever Pain Location: RLE Pain Descriptors / Indicators: Constant Pain Intervention(s): Limited activity within patient's tolerance;Monitored during session;Repositioned;Premedicated before session    Home Living  Prior Function            PT Goals (current goals can now be found in the care plan section) Acute Rehab PT Goals Patient Stated Goal: rehab then home Progress towards PT goals: Progressing toward goals    Frequency    Min  4X/week      PT Plan Equipment recommendations need to be updated    Co-evaluation              AM-PAC PT "6 Clicks" Mobility   Outcome Measure  Help needed turning from your back to your side while in a flat bed without using bedrails?: A Little Help needed moving from lying on your back to sitting on the side of a flat bed without using bedrails?: A Little Help needed moving to and from a bed to a chair (including a wheelchair)?: A Little Help needed standing up from a chair using your arms (e.g., wheelchair or bedside chair)?: A Little Help needed to walk in hospital room?: A Little Help needed climbing 3-5 steps with a railing? : A Lot 6 Click Score: 17    End of Session Equipment Utilized During Treatment: Gait belt Activity Tolerance: Patient limited by pain Patient left: in chair;with call bell/phone within reach Nurse Communication: Mobility status PT Visit Diagnosis: Unsteadiness on feet (R26.81);Muscle weakness (generalized) (M62.81);Other abnormalities of gait and mobility (R26.89);Other symptoms and signs involving the nervous system (R29.898)     Time: 7494-4967 PT Time Calculation (min) (ACUTE ONLY): 16 min  Charges:  $Gait Training: 8-22 mins                     Aida Raider, PT  Office # [phone] Pager [phone]    Ilda Foil 07/12/2021, 12:26 PM

## 2021-07-12 NOTE — Progress Notes (Signed)
Inpatient Diabetes Program Recommendations  AACE/ADA: New Consensus Statement on Inpatient Glycemic Control (2015)  Target Ranges:  Prepandial:   less than 140 mg/dL      Peak postprandial:   less than 180 mg/dL (1-2 hours)      Critically ill patients:  140 - 180 mg/dL   Lab Results  Component Value Date   GLUCAP 263 (H) 07/12/2021   HGBA1C 8.2 (H) 06/20/2021    Review of Glycemic Control  Latest Reference Range & Units 07/11/21 06:31 07/11/21 11:49 07/11/21 15:56 07/11/21 20:56 07/12/21 05:46  Glucose-Capillary 70 - 99 mg/dL 431 (H) 540 (H) 086 (H) 286 (H) 263 (H)  (H): Data is abnormally high  Diabetes history: DM2 Outpatient Diabetes medications:  Lantus 70 units BID Novolog 20 units TID Current orders for Inpatient glycemic control:  Semglee 48 units BID Novolog 0-20 units TID and 0-5 QHS Novolog 4 units TID  Inpatient Diabetes Program Recommendations:    Semgle 56 units BID (80% of home dose) Novolog 6 units TID with meals if eats at least 50%  Will continue to follow while inpatient.  Thank you, Dulce Sellar, RN, BSN Diabetes Coordinator Inpatient Diabetes Program [phone] (team pager from 8a-5p)

## 2021-07-12 NOTE — Progress Notes (Signed)
Occupational Therapy Treatment Patient Details Name: Lauren Velazquez MRN: [id number] DOB: 10-Jun-1961 Today's Date: 07/12/2021   History of present illness 60 yo female presenting to [facility] ED on 11/6 with AMS with agitation; suspect viral meningitis. Intubated 11/6-11/12 for airway protection after sedation. Transfered to [facility] on 11/15. EEG negative for ongoing seizures. Rapid response called for unresponsiveness and hypotension on 11/16. 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 Intubated 11/16-11/18. PMH including HTN, T2DM, vertigo, anxiety, chronic back pain, and HLD.   OT comments  Patient received in supine and was able to get to eob with initiation cue. Patient was able to ambulate to sink and perform grooming tasks with min guard for safety. Patient stated she was unable to doff and donn socks. Patient educated on sock aide use for donning sock with mod assist to perform but potential to increase independence with further training. Patient performed simulated toilet transfers with min guard and verbal cues for safety. Patient is making good progress with OT treatment. Acute OT to continue to follow.   Recommendations for follow up therapy are one component of a multi-disciplinary discharge planning process, led by the attending physician.  Recommendations may be updated based on patient status, additional functional criteria and insurance authorization.    Follow Up Recommendations  Acute inpatient rehab (3hours/day)    Assistance Recommended at Discharge Frequent or constant Supervision/Assistance  Equipment Recommendations       Recommendations for Other Services      Precautions / Restrictions Precautions Precautions: Fall       Mobility Bed Mobility Overal bed mobility: Needs Assistance Bed Mobility: Supine to Sit     Supine to sit: Supervision     General bed mobility comments: patient able  to get from supine with intiation cue    Transfers Overall transfer level: Needs assistance Equipment used: Rolling walker (2 wheels) Transfers: Sit to/from Stand Sit to Stand: Supervision           General transfer comment: min guard for simulated toilet transfer     Balance Overall balance assessment: Needs assistance Sitting-balance support: Feet supported;No upper extremity supported Sitting balance-Leahy Scale: Good Sitting balance - Comments: able to sit on eob without assistance   Standing balance support: No upper extremity supported;Bilateral upper extremity supported;During functional activity Standing balance-Leahy Scale: Poor Standing balance comment: able to stand at sink for grooming                           ADL either performed or assessed with clinical judgement   ADL Overall ADL's : Needs assistance/impaired     Grooming: Wash/dry hands;Wash/dry face;Min guard;Standing Grooming Details (indicate cue type and reason): performed grooming standing at sink             Lower Body Dressing: Moderate assistance;With adaptive equipment Lower Body Dressing Details (indicate cue type and reason): education on sock aid use for LB dressing. Patient was mod assist to doff socks and mod assist to donn with sock aide Toilet Transfer: Min guard;Rolling walker (2 wheels) Toilet Transfer Details (indicate cue type and reason): simulated with recliner         Functional mobility during ADLs: Min guard;Rolling walker (2 wheels) General ADL Comments: difficulty with LB dressing due to difficulty reaching feet.  Educationed on sock aide, could benefit from further AE training    Extremity/Trunk Assessment  Vision       Perception     Praxis      Cognition Arousal/Alertness: Awake/alert Behavior During Therapy: WFL for tasks assessed/performed Overall Cognitive Status: Within Functional Limits for tasks assessed Area of  Impairment: Attention;Memory;Following commands;Safety/judgement;Problem solving;Awareness                   Current Attention Level: Selective Memory: Decreased short-term memory;Decreased recall of precautions Following Commands: Follows one step commands consistently Safety/Judgement: Decreased awareness of deficits;Decreased awareness of safety Awareness: Emergent Problem Solving: Slow processing;Requires verbal cues General Comments: able to follow commands, decreased safety          Exercises     Shoulder Instructions       General Comments      Pertinent Vitals/ Pain       Pain Assessment: Faces Pain Score: 10-Worst pain ever Faces Pain Scale: Hurts a little bit Pain Location: RLE Pain Descriptors / Indicators: Constant Pain Intervention(s): Monitored during session;Repositioned  Home Living                                          Prior Functioning/Environment              Frequency  Min 2X/week        Progress Toward Goals  OT Goals(current goals can now be found in the care plan section)  Progress towards OT goals: Progressing toward goals  Acute Rehab OT Goals Patient Stated Goal: to get more rehab OT Goal Formulation: With patient Time For Goal Achievement: 07/16/21 Potential to Achieve Goals: Fair ADL Goals Pt Will Perform Grooming: with min assist;sitting Pt Will Perform Upper Body Dressing: with mod assist;sitting Pt Will Perform Lower Body Dressing: with min assist;with adaptive equipment;sit to/from stand Pt Will Transfer to Toilet: with mod assist;stand pivot transfer;bedside commode Additional ADL Goal #1: Pt will follow one step commands 75% of time during ADLs Additional ADL Goal #2: Pt will perform bed mobility with Mod A in preparation for ADLs  Plan Discharge plan remains appropriate    Co-evaluation                 AM-PAC OT "6 Clicks" Daily Activity     Outcome Measure   Help from another  person eating meals?: None Help from another person taking care of personal grooming?: A Little Help from another person toileting, which includes using toliet, bedpan, or urinal?: A Lot Help from another person bathing (including washing, rinsing, drying)?: A Lot Help from another person to put on and taking off regular upper body clothing?: A Lot Help from another person to put on and taking off regular lower body clothing?: A Lot 6 Click Score: 15    End of Session Equipment Utilized During Treatment: Gait belt;Rolling walker (2 wheels);Oxygen  OT Visit Diagnosis: Unsteadiness on feet (R26.81);Muscle weakness (generalized) (M62.81)   Activity Tolerance Patient tolerated treatment well   Patient Left in chair;with call bell/phone within reach;with bed alarm set   Nurse Communication Mobility status        Time: 3614-4315 OT Time Calculation (min): 23 min  Charges: OT General Charges $OT Visit: 1 Visit OT Treatments $Self Care/Home Management : 23-37 mins  Alfonse Flavors, OTA Acute Rehabilitation Services  Pager [phone] Office [phone]   Dewain Penning 07/12/2021, 2:09 PM

## 2021-07-13 LAB — GLUCOSE, CAPILLARY
Glucose-Capillary: 328 mg/dL — ABNORMAL HIGH (ref 70–99)
Glucose-Capillary: 235 mg/dL — ABNORMAL HIGH (ref 70–99)
Glucose-Capillary: 418 mg/dL — ABNORMAL HIGH (ref 70–99)
Glucose-Capillary: 325 mg/dL — ABNORMAL HIGH (ref 70–99)

## 2021-07-13 MED ORDER — INSULIN ASPART 100 UNIT/ML IJ SOLN
10.0000 [IU] | Freq: Three times a day (TID) | INTRAMUSCULAR | Status: DC
Start: 2021-07-13 — End: 2021-07-16
  Administered 2021-07-13 – 2021-07-16 (×10): 10 [IU] via SUBCUTANEOUS

## 2021-07-13 MED ORDER — INSULIN ASPART 100 UNIT/ML IJ SOLN
24.0000 [IU] | Freq: Once | INTRAMUSCULAR | Status: AC
  Administered 2021-07-13: 24 [IU] via SUBCUTANEOUS

## 2021-07-13 MED ORDER — INSULIN GLARGINE-YFGN 100 UNIT/ML ~~LOC~~ SOLN
64.0000 [IU] | Freq: Two times a day (BID) | SUBCUTANEOUS | Status: DC
Start: 2021-07-13 — End: 2021-07-16
  Administered 2021-07-13 – 2021-07-16 (×6): 64 [IU] via SUBCUTANEOUS
  Filled 2021-07-13 (×7): qty 0.64

## 2021-07-13 MED ORDER — INSULIN GLARGINE-YFGN 100 UNIT/ML ~~LOC~~ SOLN
8.0000 [IU] | Freq: Once | SUBCUTANEOUS | Status: AC
Start: 2021-07-13 — End: 2021-07-13
  Administered 2021-07-13: 8 [IU] via SUBCUTANEOUS
  Filled 2021-07-13: qty 0.08

## 2021-07-13 MED ORDER — INSULIN GLARGINE-YFGN 100 UNIT/ML ~~LOC~~ SOLN
60.0000 [IU] | Freq: Two times a day (BID) | SUBCUTANEOUS | Status: DC
Start: 2021-07-13 — End: 2021-07-13
  Filled 2021-07-13 (×2): qty 0.6

## 2021-07-13 NOTE — Progress Notes (Signed)
Pt observed to be sleeping while sitting on the bedside, earlier refused her bed alarm to be activated despite her being a high fall risk, CN Rocky Link made aware who came and saw pt sleeping and waving while sitting on the bedside. Will however continue to monitor. Lauren Velazquez, Lauren Velazquez

## 2021-07-13 NOTE — TOC Progression Note (Signed)
Transition of Care [facility]) - Progression Note    Patient Details  Name: Lauren Velazquez MRN: [id number] Date of Birth: [birth year]  [facility] CM/SW Contact  Kermit Balo, RN Phone Number: 07/13/2021, 11:22 AM  Clinical Narrative:    CM left voicemail with [name] to see about if they started insurance for a rehab admission. CM faxed updated notes to [facility] yesterday.  TOC following.   Expected Discharge Plan: IP Rehab Facility Barriers to Discharge: Continued Medical Work up  Expected Discharge Plan and Services Expected Discharge Plan: IP Rehab Facility   Discharge Planning Services: CM Consult Post Acute Care Choice: IP Rehab Living arrangements for the past 2 months: Single Family Home                                       Social Determinants of Health (SDOH) Interventions    Readmission Risk Interventions No flowsheet data found.

## 2021-07-13 NOTE — Progress Notes (Signed)
Inpatient Diabetes Program Recommendations  AACE/ADA: New Consensus Statement on Inpatient Glycemic Control (2015)  Target Ranges:  Prepandial:   less than 140 mg/dL      Peak postprandial:   less than 180 mg/dL (1-2 hours)      Critically ill patients:  140 - 180 mg/dL   Lab Results  Component Value Date   GLUCAP 328 (H) 07/13/2021   HGBA1C 8.2 (H) 06/20/2021    Review of Glycemic Control  Latest Reference Range & Units 07/12/21 05:46 07/12/21 12:14 07/12/21 16:27 07/12/21 21:37 07/13/21 06:38  Glucose-Capillary 70 - 99 mg/dL 782 (H) 956 (H) 213 (H) 256 (H) 328 (H)  (H): Data is abnormally high  Diabetes history: DM2 Outpatient Diabetes medications: Semglee 80 units TID, Novolog 30 units TID Current orders for Inpatient glycemic control: Semglee 54 units BID, Novolog 0-20 units TID and 0-5 QHS, Novolog 6 units TID  Inpatient Diabetes Program Recommendations:    Semglee 65 units BID Novolog 10 units TID with meals if eats at least 50%  Spoke with patient at bedside to confirm medication as CBG's remain elevated.  She states she takes Semglee 80 units TID and Novolog 30 units TID with meals.    Will continue to follow while inpatient.  Thank you, Dulce Sellar, RN, BSN Diabetes Coordinator Inpatient Diabetes Program [phone] (team pager from 8a-5p)

## 2021-07-13 NOTE — Progress Notes (Addendum)
Physical Therapy Treatment Patient Details Name: Lauren Velazquez MRN: [id number] DOB: 01-May-1961 Today's Date: 07/13/2021   History of Present Illness 60 yo female presenting to [facility] ED on 11/6 with AMS with agitation; suspect viral meningitis. Intubated 11/6-11/12 for airway protection after sedation. Transfered to [facility] on 11/15. EEG negative for ongoing seizures. Rapid response called for unresponsiveness and hypotension on 11/16. 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 Intubated 11/16-11/18. PMH including HTN, T2DM, vertigo, anxiety, chronic back pain, and HLD.    PT Comments    Pt received sitting EOB with husband and daughter present, pt agreeable to therapy session. Pt progressing towards goals as demonstrated by increased distance with household gait tasks and notably less self-limiting due to pain this session. Pt requires verbal cues for RW management and drifts left/right with horizontal head turns and scored <19/24 on Dynamic Gait Index, indicating increased risk for falls. Pt reports 5/10 modified RPE (fatigue) at end of session. Continue to recommend AIR upon DC. Pt continues to benefit from PT services to progress toward functional mobility goals.       Recommendations for follow up therapy are one component of a multi-disciplinary discharge planning process, led by the attending physician.  Recommendations may be updated based on patient status, additional functional criteria and insurance authorization.  Follow Up Recommendations  Acute inpatient rehab (3hours/day)     Assistance Recommended at Discharge Frequent or constant Supervision/Assistance  Equipment Recommendations  Rolling walker (2 wheels)    Recommendations for Other Services       Precautions / Restrictions Precautions Precautions: Fall Precaution Comments: vaginal bleeding (MD wants pads counted) Restrictions Weight Bearing  Restrictions: No     Mobility  Bed Mobility  General bed mobility comments: Pt sitting EOB upon arrival and exit    Transfers Overall transfer level: Needs assistance Equipment used: Rolling walker (2 wheels) Transfers: Sit to/from Stand Sit to Stand: Supervision   General transfer comment: Pt able to stand from EOB with momentum and reminders for hand placement    Ambulation/Gait Ambulation/Gait assistance: Min guard Gait Distance (Feet): 150 Feet Assistive device: Rolling walker (2 wheels) Gait Pattern/deviations: Step-through pattern;Decreased stride length;Trunk flexed;Drifts right/left Gait velocity: decreased     General Gait Details: Cues for RW management, drifts to left/right with horizontal head turns, unable to pivot turn (possibly due to not understanding directions) instead does a wide turn   Modified Rankin (Stroke Patients Only) Modified Rankin (Stroke Patients Only) Pre-Morbid Rankin Score: No symptoms Modified Rankin: Moderately severe disability     Balance   Sitting-balance support: Feet supported;No upper extremity supported Sitting balance-Leahy Scale: Good Sitting balance - Comments: sitting EOB without assistance   Standing balance support: Bilateral upper extremity supported;During functional activity Standing balance-Leahy Scale: Poor Standing balance comment: reliant on external support   Standardized Balance Assessment Standardized Balance Assessment : Dynamic Gait Index   Dynamic Gait Index Level Surface: Mild Impairment Change in Gait Speed: Moderate Impairment Gait with Horizontal Head Turns: Moderate Impairment Gait with Vertical Head Turns: Moderate Impairment Gait and Pivot Turn: Moderate Impairment Step Over Obstacle: Mild Impairment     Cognition Arousal/Alertness: Awake/alert Behavior During Therapy: WFL for tasks assessed/performed Overall Cognitive Status: Within Functional Limits for tasks assessed    Memory: Decreased  short-term memory;Decreased recall of precautions Following Commands: Follows one step commands with increased time Safety/Judgement: Decreased awareness of safety;Decreased awareness of deficits   Problem Solving: Slow processing;Difficulty sequencing;Requires  verbal cues General Comments: Pt able to follow one-step commands with verbal cues and increased time, decreased safety awareness      Exercises Other Exercises Other Exercises: Seated: Marching x10, LAQ x10    General Comments General comments (skin integrity, edema, etc.): Progress to stairs next session, pt has 2 sets of 5 stairs with no rail to enter home, can rest in between sets      Pertinent Vitals/Pain Pain Assessment: 0-10 Pain Score: 7  Pain Location: RLE Pain Descriptors / Indicators: Aching;Constant;Radiating Pain Intervention(s): Limited activity within patient's tolerance;Monitored during session;Premedicated before session    PT Goals (current goals can now be found in the care plan section) Acute Rehab PT Goals Patient Stated Goal: rehab then home PT Goal Formulation: With patient Time For Goal Achievement: 07/16/21 Progress towards PT goals: Progressing toward goals    Frequency    Min 4X/week      PT Plan Current plan remains appropriate       AM-PAC PT "6 Clicks" Mobility   Outcome Measure  Help needed turning from your back to your side while in a flat bed without using bedrails?: A Little Help needed moving from lying on your back to sitting on the side of a flat bed without using bedrails?: A Little Help needed moving to and from a bed to a chair (including a wheelchair)?: A Little Help needed standing up from a chair using your arms (e.g., wheelchair or bedside chair)?: A Little Help needed to walk in hospital room?: A Lot (mod+ cues for this and below) Help needed climbing 3-5 steps with a railing? : A Lot 6 Click Score: 16    End of Session Equipment Utilized During Treatment: Gait  belt Activity Tolerance: Patient tolerated treatment well Patient left: in bed;with call bell/phone within reach;with family/visitor present (Daughter and husband present) Nurse Communication: Mobility status PT Visit Diagnosis: Unsteadiness on feet (R26.81);Muscle weakness (generalized) (M62.81);Other abnormalities of gait and mobility (R26.89);Other symptoms and signs involving the nervous system (R29.898)     Time: 1165-7903 PT Time Calculation (min) (ACUTE ONLY): 17 min  Charges:  $Gait Training: 8-22 mins                     Harland German, Student PTA CI: Carly P., PTA  Carly M Poff 07/13/2021, 3:01 PM

## 2021-07-13 NOTE — Progress Notes (Signed)
RD Sign Off Note  Patient is hospital day 12 and currently awaiting discharge to inpatient rehab.   Admitting Dx: Seizure (HCC) [R56.9]  PMH:  Past Medical History:  Diagnosis Date   Anemia    not currently under treatment   Anxiety    Asthma    during allergy season   Diabetes mellitus without complication (HCC)    Hypertension    Vertigo    Medications:  Scheduled Meds:  amLODipine  10 mg Oral Daily   bethanechol  10 mg Oral TID   budesonide  1 mg Nebulization BID   ezetimibe  10 mg Oral Daily   feeding supplement (GLUCERNA SHAKE)  237 mL Oral TID BM   folic acid  1 mg Oral Daily   gabapentin  600 mg Oral TID   insulin aspart  0-20 Units Subcutaneous TID WC   insulin aspart  0-5 Units Subcutaneous QHS   insulin aspart  10 Units Subcutaneous TID WC   insulin glargine-yfgn  64 Units Subcutaneous BID   insulin glargine-yfgn  8 Units Subcutaneous Once   lacosamide  100 mg Oral BID   levETIRAcetam  1,000 mg Oral BID   lisinopril  40 mg Oral Daily   megestrol  40 mg Oral BID   QUEtiapine  50 mg Oral BID   Rivaroxaban  15 mg Oral BID WC   Followed by   Melene Muller ON 07/28/2021] rivaroxaban  20 mg Oral Q supper   senna-docusate  2 tablet Oral QHS   sodium chloride flush  10-40 mL Intracatheter Q12H   umeclidinium-vilanterol  1 puff Inhalation Daily   vitamin B-12  500 mcg Oral Daily  Continuous Infusions:  sodium chloride Stopped (07/05/21 1206)   Labs: Recent Labs  Lab 07/08/21 0113 07/12/21 0220  NA 139 139  K 4.1 4.3  CL 105 106  CO2 26 26  BUN 5* 9  CREATININE 0.63 0.71  CALCIUM 9.0 9.3  GLUCOSE 239* 311*  CBGs 161-365 x 24 hours (Diabetes Coordinator is following)  Wt Readings from Last 15 Encounters:  07/09/21 109.4 kg  06/28/21 104.8 kg  07/17/20 104.9 kg  03/05/20 (!) 104.3 kg  03/31/19 104.3 kg  05/17/18 102.5 kg  05/14/18 102.3 kg  12/22/17 95.3 kg  06/01/17 95.3 kg  05/31/17 97.5 kg  01/22/15 99.8 kg  Body mass index is 45.57 kg/m. Patient  meets criteria for morbid obesity based on current BMI.   Current diet order is carb modified, patient is consuming approximately 75-100% of meals at this time.    No additional nutrition interventions warranted at this time. If further nutrition issues arise, please consult RD.    Rae Lips., MS, RD, LDN (she/her/hers) RD pager number and weekend/on-call pager number located in Amion.

## 2021-07-13 NOTE — Progress Notes (Signed)
PROGRESS NOTE    Lauren Velazquez  [id number] DOB: Nov 23, 1960 DOA: 06/30/2021 PCP: Denton Lank, MD   Brief Narrative/Hospital Course: Lauren Velazquez, 60 y.o. female with PMH of diabetes hypertension chronic low back pain chronic right leg pain on chronic high-dose opiates, asthma, chronic hypoxic aspiratory failure on 3.5 L nasal cannula presented to [facility] with erratic behavior on 11/5-multiple complaints of chest pain shortness of breath, agitated bizarre behavior, trying to stick her finger down the throat to make her self vomit. Refer to previous ED notes in detail patient was placed on IVC, while in ED became febrile, septic with severely altered mental status concerning for meningitis.  Other differential included viral meningitis sturgeon syndrome opioid withdrawal.  Chest x-ray UA no obvious source of infection.  She had increasing agitation refractory to benzodiazepine then subsequently sedated and intubated and had LP done.  MRI 11/7-showed acute infarct.  Was successfully extubated 11/12. 11/15 transferred to [place] On 11/16 transferred to the floor intermittently drowsy throughout the day then had an episode of complete unresponsiveness with foaming and posturing, acute hypotension, was intubated emergently and transferred to ICU seen by neurology given Keppra and transferred to [facility] for continuous EEG. Subsequently transferred to [facility]. Waiting on inpatient rehabilitation.  Unfortunately Lauren Velazquez inpatient rehabilitation is out of network for her insurance.  Significant events 06/20/21- Admit to ICU  with suspected meningitis s/p mechanical intubation for airway protection in the setting of increased sedation needs and agitation. S/p LP. ID consult. Patient started empirically on vancomycin, ceftriaxone, ampicillin and acyclovir.  ID consult. 06/21/21- MRI w/out contrast shows acute infarcts (embolic vs. Hypotensive). Neuro, and psych consulted.  TTE showed normal  LVEF, grade I diastolic dysfunction, no sig valvular abnormalities and no intracardiac clot.  rEEG showed moderate diffuse slowing with subtle superimposed right focal slowing without epileptiform abnormalities 06/22/21-persistent agitation off of sedation.  Started Depakene for mood stabilization and cyproheptadine for potential serotonergic symptoms. 06/23/21: Remains on the vent and sedated. 06/24/21: Remains on the vent. Neuro eval recs central embolic suspected.  Start ASA 81mg  daily + plavix 75mg  daily x21 days f/b ASA 81mg  daily after that.  Consider TEE 06/25/21: Remains on the vent and sedation.  Status post TEE negative for thrombus or vegetation. 06/26/21: S/p extubation now IVC with sitter at the bedside 06/27/21: Stable on [place] @2L  with fluctuating mental status. Sitter at bedside 06/28/21: Restart home dose of oxycodone for concern of possible withdrawal. EEG per neuro r/o seizure activity. Patient stable, able to be transferred to step down care.  06/29/21: Transferred to Med/surg 06/30/21: Rapid response called for unresponsiveness and hypotension.  Upon arrival to ICU concern for seizure activity.  Intubated for airway protection.  Transferred to [facility] for continuous EEG, PCCM admit 11/17 no further seizures. More scattered punctate infarcts on MRI compared to 11/7  11/18 extubated  11/19 had 2 witnessed seizures, loaded with Keppra, on EEG monitoring    Subjective: Patient feels well.  Vaginal bleeding has completely subsided.  Continues to have back pain but is much better controlled than before.  Right leg weakness persists.  Waiting on rehabilitation.      Assessment & Plan:  Acute metabolic encephalopathy Reason for encephalopathy was thought to be medication induced. Patient was placed on Seroquel 50 mg twice a day.   She is back to baseline now.    Chronic pain on opiates and Neurontin Home medication list was reviewed.  It looks like she takes Percocet 10/325 1 tablet  every  5 hours as needed for pain.  Also noted to be on Neurontin 900 mg 3 times a day.  There is also tramadol listed on her home medication list.   She was complaining of back pain and right leg pain which is chronic.  Recently received steroid injection to the back.   Her narcotics were changed over to every 4 hours as needed. Gabapentin being continued at a lower dose of 600 mg 3 times a day.   Pain is better controlled.  Right lower extremity weakness is from her stroke.  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  Duplex shows left leg with age indeterminate DVT on 07/02/20.  Completed stroke work-up.  Echo shows EF 60 to 65%, TEE no shunt, LDL 133, HbA1c 8.2. Zetia.  Not on statins due to intolerance.  Continue PT OT. On anticoagulation for DVT.  Not on antiplatelet agents.  Seizure episode:  Continue Keppra and Vimpat as per neurology.  Continue seizure precaution, no driving x6 months.  Remains seizure-free.  DVT involving left lower extremity, age indeterminate versus chronic  Case was discussed with hematology (Dr. Benay Spice) by previous rounding physician.  They recommended repeating Doppler study and to initiate anticoagulation if positive.  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  Will benefit from 3 months of anticoagulation.   Started on rivaroxaban.  She is tolerating it well without any significant bleeding complications.  Vaginal bleeding has subsided.  Vaginal bleeding postmenopausal Underwent a transabdominal ultrasound.  No fibroids were noted.  Cystic lesion of the left ovary or adnexa was noted. GYN consultation in the outpatient setting was recommended.   She is not interested in undergoing transvaginal ultrasound.  She would prefer to follow-up with her GYN in [place]. After discussions with GYN, Dr. Ilda Basset patient was started on Megace 40 mg twice daily.  Vaginal bleeding has  subsided.  Hemoglobin has been stable.   Continue Megace at current dose to patient has had opportunity to follow-up with her gynecologist in [place].    Normocytic anemia/iron deficiency/folic acid deficiency Noted to have longstanding history of anemia.  Reason for this is not clear.  Anemia panel suggested iron deficiency.  She was given iron infusion.   Also noted to have Foley cath deficiency as well as low B12 level.  Started on folic acid and [id number].  Will need iron supplementation at discharge.   Some of the drop in hemoglobin could also be from her vaginal bleeding as discussed above. Hemoglobin has been stable.  We will recheck tomorrow.  Acute on chronic hypoxic respiratory failure extubated 11/18 OSA Chronic restrictive lung disease post COVID Possible aspiration event Reports using 3.5 L nocturnal oxygen at home which is her current setting.  Followed by [facility].  Completed Unasyn for aspiration.  Respiratory status is stable.  Essential hypertension Currently on amlodipine and lisinopril.  Blood pressure reasonably well controlled for the most part.   Occasional high readings noted.  Continue to monitor for now.  Pain is likely contributing to high blood pressure readings.  Mild to moderate MR  Outpatient follow-up  Diabetes mellitus type 2, poorly controlled with hyperglycemia  HbA1c 9.1. At home she is on Lantus insulin 70 units twice a day along with the NovoLog sliding scale basis. Currently on glargine insulin.  CBGs remain poorly controlled.  We will further titrate the dose of her insulin.  She is also on meal coverage.  Dysphagia  Resolved.  Now back on carb modified diet.  Urinary retention  Foley was discontinued on 11/23.  She has been able to void on her own.  Patient is on bethanechol.  Will discontinue at discharge.  Physical deconditioning/debility:  PT OT has suggested inpatient rehab TOC on consult.  [facility] is outside of the network.     Class III Obesity: Estimated body mass index is 45.57 kg/m as calculated from the following:   Height as of this encounter: 5\' 1"  (1.549 m).   Weight as of this encounter: 109.4 kg.   DVT prophylaxis: Subcutaneous heparin Code Status:   Code Status: Full Code Family Communication: Discussed with patient.  No family at bedside. Disposition: Waiting on [facility] inpatient rehabilitation  Status is: Inpatient Remains inpatient appropriate because: For ongoing management of deconditioning debility   Objective: Vitals last 24 hrs: Vitals:   07/12/21 1957 07/12/21 2331 07/13/21 0331 07/13/21 0744  BP: 137/68 (!) 144/75 (!) 153/68 126/63  Pulse: 93 100 82 85  Resp: 18 16 16 18   Temp: 97.8 F (36.6 C) 97.7 F (36.5 C) 97.8 F (36.6 C) 97.8 F (36.6 C)  TempSrc: Oral Oral Oral Oral  SpO2: 98% 97% 96% 98%  Weight:      Height:       Weight change:   Intake/Output Summary (Last 24 hours) at 07/13/2021 1005 Last data filed at 07/13/2021 0740 Gross per 24 hour  Intake 240 ml  Output --  Net 240 ml    Net IO Since Admission: -1,052.28 mL [07/13/21 1005]    Physical Examination:  General appearance: Awake alert.  In no distress Resp: Clear to auscultation bilaterally.  Normal effort Cardio: S1-S2 is normal regular.  No S3-S4.  No rubs murmurs or bruit GI: Abdomen is soft.  Nontender nondistended.  Bowel sounds are present normal.  No masses organomegaly Extremities: No edema.   Neurologic: Alert and oriented x3.  Right leg weakness as before    Medications reviewed:  Scheduled Meds:  amLODipine  10 mg Oral Daily   bethanechol  10 mg Oral TID   budesonide  1 mg Nebulization BID   ezetimibe  10 mg Oral Daily   feeding supplement (GLUCERNA SHAKE)  237 mL Oral TID BM   folic acid  1 mg Oral Daily   gabapentin  600 mg Oral TID   insulin aspart  0-20 Units Subcutaneous TID WC   insulin aspart  0-5 Units Subcutaneous QHS   insulin aspart  6 Units Subcutaneous TID WC    insulin glargine-yfgn  60 Units Subcutaneous BID   lacosamide  100 mg Oral BID   levETIRAcetam  1,000 mg Oral BID   lisinopril  40 mg Oral Daily   megestrol  40 mg Oral BID   QUEtiapine  50 mg Oral BID   Rivaroxaban  15 mg Oral BID WC   Followed by   07/15/2021 ON 07/28/2021] rivaroxaban  20 mg Oral Q supper   senna-docusate  2 tablet Oral QHS   sodium chloride flush  10-40 mL Intracatheter Q12H   umeclidinium-vilanterol  1 puff Inhalation Daily   vitamin B-12  500 mcg Oral Daily   Continuous Infusions:  sodium chloride Stopped (07/05/21 1206)   Diet Order             Diet Carb Modified Fluid consistency: Thin; Room service appropriate? Yes  Diet effective now  Nutrition Problem: Inadequate oral intake Etiology: inability to eat Signs/Symptoms: NPO status Interventions: Glucerna shake  Weight change:   Wt Readings from Last 3 Encounters:  07/09/21 109.4 kg  06/28/21 104.8 kg  07/17/20 104.9 kg     Consultants: Seen by neurology and critical care medicine  Procedures:see note Antimicrobials:  11/6 Vancomycin >> 11/10 11/6 Ampicillin >>11/13 11/6 Ceftriaxone >>11/14 11/6 Acyclovir >>11/9 11/6 Cefepime >> x 1 dose 11/16 unasyn -11/20  Culture/Microbiology  11/6>> CSF- mild pleocytosis. Culture- no growth at 4 days. VDRL- negative.  11/6>> blood cultures- negative (no growth @4d ) 11/6>> MRSA PCR - negative  11/6>> HSV1/2- negative  11/6>> Crypotococcal Ag - negative      Component Value Date/Time   SDES  06/30/2021 1715    TRACHEAL ASPIRATE Performed at [facility], [address]elaine Bhat [place], [place] [address]    [facility]  06/30/2021 1715    NONE Performed at [facility], [address] [place], [place] [address]    CULT  06/30/2021 1715    FEW Normal respiratory flora-no Staph aureus or Pseudomonas seen Performed at [facility], [place] [address], [place], [place] [address]    REPTSTATUS 07/03/2021 FINAL  06/30/2021 1715    Other culture-see note  Unresulted Labs (From admission, onward)    None      Data Reviewed: I have personally reviewed following labs and imaging studies  CBC: Recent Labs  Lab 07/08/21 0113 07/09/21 0348 07/10/21 0233 07/11/21 0158 07/12/21 0220  WBC 9.6 8.8 9.3 10.3 11.0*  HGB 10.2* 8.6* 9.0* 9.3* 9.3*  HCT 35.8* 29.7* 31.0* 31.7* 31.9*  MCV 83.4 83.4 83.6 82.6 83.3  PLT 451* 370 366 415* 0000000    Basic Metabolic Panel: Recent Labs  Lab 07/08/21 0113 07/12/21 0220  NA 139 139  K 4.1 4.3  CL 105 106  CO2 26 26  GLUCOSE 239* 311*  BUN 5* 9  CREATININE 0.63 0.71  CALCIUM 9.0 9.3    GFR: Estimated Creatinine Clearance: 87.6 mL/min (by C-G formula based on SCr of 0.71 mg/dL).  CBG: Recent Labs  Lab 07/12/21 0546 07/12/21 1214 07/12/21 1627 07/12/21 2137 07/13/21 0638  GLUCAP 263* 161* 365* 256* 328*       Radiology Studies: No results found.   LOS: 13 days   Bonnielee Haff, MD [facility]  07/13/2021, 10:05 AM

## 2021-07-14 DIAGNOSIS — Z4659 Encounter for fitting and adjustment of other gastrointestinal appliance and device: Secondary | ICD-10-CM

## 2021-07-14 DIAGNOSIS — G9341 Metabolic encephalopathy: Secondary | ICD-10-CM

## 2021-07-14 DIAGNOSIS — I82409 Acute embolism and thrombosis of unspecified deep veins of unspecified lower extremity: Secondary | ICD-10-CM

## 2021-07-14 DIAGNOSIS — J984 Other disorders of lung: Secondary | ICD-10-CM

## 2021-07-14 DIAGNOSIS — G4733 Obstructive sleep apnea (adult) (pediatric): Secondary | ICD-10-CM

## 2021-07-14 DIAGNOSIS — N939 Abnormal uterine and vaginal bleeding, unspecified: Secondary | ICD-10-CM

## 2021-07-14 LAB — CBC
HCT: 30.2 % — ABNORMAL LOW (ref 36.0–46.0)
Hemoglobin: 8.9 g/dL — ABNORMAL LOW (ref 12.0–15.0)
MCH: 24.7 pg — ABNORMAL LOW (ref 26.0–34.0)
MCHC: 29.5 g/dL — ABNORMAL LOW (ref 30.0–36.0)
MCV: 83.9 fL (ref 80.0–100.0)
Platelets: 330 10*3/uL (ref 150–400)
RBC: 3.6 MIL/uL — ABNORMAL LOW (ref 3.87–5.11)
RDW: 17.5 % — ABNORMAL HIGH (ref 11.5–15.5)
WBC: 9.2 10*3/uL (ref 4.0–10.5)
nRBC: 0 % (ref 0.0–0.2)

## 2021-07-14 LAB — GLUCOSE, CAPILLARY
Glucose-Capillary: 282 mg/dL — ABNORMAL HIGH (ref 70–99)
Glucose-Capillary: 342 mg/dL — ABNORMAL HIGH (ref 70–99)
Glucose-Capillary: 335 mg/dL — ABNORMAL HIGH (ref 70–99)
Glucose-Capillary: 326 mg/dL — ABNORMAL HIGH (ref 70–99)

## 2021-07-14 MED ORDER — LIDOCAINE 5 % EX PTCH
1.0000 | MEDICATED_PATCH | CUTANEOUS | Status: DC
  Administered 2021-07-14 – 2021-07-16 (×3): 1 via TRANSDERMAL
  Filled 2021-07-14 (×4): qty 1

## 2021-07-14 MED ORDER — FUROSEMIDE 40 MG PO TABS
40.0000 mg | ORAL_TABLET | Freq: Once | ORAL | Status: AC
  Administered 2021-07-14: 40 mg via ORAL
  Filled 2021-07-14: qty 1

## 2021-07-14 NOTE — TOC Progression Note (Signed)
Transition of Care [facility]) - Progression Note    Patient Details  Name: Alandria Butkiewicz MRN: [id number] Date of Birth: 07-Nov-1960  [facility] CM/SW Contact  Pollie Friar, RN Phone Number: 07/14/2021, 12:55 PM  Clinical Narrative:    CM talked to [facility] and they still have not officially offered a bed. Representative says she is still trying to get confirmation from her MD.  CM has left voicemail again with [facility] IR to see if they are able to offer her a bed.  CM met with the patient and her family to update her on the situation. She is now agreeable to being faxed out for [facility] rehab in the [place] area. She doesn't want [facility] or [facility]. CM has completed FL2 and faxed her to the remaining facilities in [facility].  TOC following.   Expected Discharge Plan: IP Rehab Facility Barriers to Discharge: Continued Medical Work up  Expected Discharge Plan and Services Expected Discharge Plan: [place]   Discharge Planning Services: CM Consult Post Acute Care Choice: IP Rehab Living arrangements for the past 2 months: Single Family Home                                       Social Determinants of Health (SDOH) Interventions    Readmission Risk Interventions No flowsheet data found.

## 2021-07-14 NOTE — Progress Notes (Signed)
Physical Therapy Treatment Patient Details Name: Lauren Velazquez MRN: [id number] DOB: 01-04-1961 Today's Date: 07/14/2021   History of Present Illness 60 yo female presenting to [facility] ED on 11/6 with AMS with agitation; suspect viral meningitis. Intubated 11/6-11/12 for airway protection after sedation. Transfered to [facility] on 11/15. EEG negative for ongoing seizures. Rapid response called for unresponsiveness and hypotension on 11/16. 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 Intubated 11/16-11/18. PMH including HTN, T2DM, vertigo, anxiety, chronic back pain, and HLD.    PT Comments    Pt received barefoot on toilet in bathroom, agreeable to therapy session after donning socks with assistance and with good participation and tolerance for gait and stair training with RW support. Pt needing up to minA for stairs but does have 10 STE home, was able to perform 3 steps up/down today prior to returning to room. 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   Recommendations for follow up therapy are one component of a multi-disciplinary discharge planning process, led by the attending physician.  Recommendations may be updated based on patient status, additional functional criteria and insurance authorization.  Follow Up Recommendations  Acute inpatient rehab (3hours/day)     Assistance Recommended at Discharge Frequent or constant Supervision/Assistance  Equipment Recommendations  Rolling walker (2 wheels)    Recommendations for Other Services       Precautions / Restrictions Precautions Precautions: Fall Restrictions Weight Bearing Restrictions: No     Mobility  Bed Mobility    General bed mobility comments:  received in bathroom/remained up in recliner    Transfers Overall transfer level: Needs assistance Equipment used: Rolling walker (2 wheels) Transfers: Sit to/from Stand Sit to Stand: Supervision;Min guard           General transfer comment: from toilet with Supervision using wall rail, then needing min guard for stand>sit to chair without arm rests in PT gym.    Ambulation/Gait Ambulation/Gait assistance: Min guard Gait Distance (Feet): 90 Feet (x2 with seated break in PT gym) Assistive device: Rolling walker (2 wheels) Gait Pattern/deviations: Step-through pattern;Decreased stride length;Trunk flexed;Drifts right/left Gait velocity: decreased     General Gait Details: Cues for RW management, drifts to left/right with horizontal head turns, unable to pivot turn (possibly due to not understanding directions) instead does a wide turn   Stairs Stairs: Yes Stairs assistance: Min assist Stair Management: With walker;Forwards;Step to pattern Number of Stairs: 3 General stair comments: pt ascended/descended 3 steps in PT gym (~3-4" height each) with mod cues for safety, RW use and sequencing. Pt reports she uses a RW for hers at home since she does not have a hand rail to use. minA for steadying/lift assist. Per pt report she has 5 +5 STE but too fatigued to perform more than 3 at a time.   Wheelchair Mobility    Modified Rankin (Stroke Patients Only) Modified Rankin (Stroke Patients Only) Pre-Morbid Rankin Score: No symptoms Modified Rankin: Moderately severe disability     Balance Overall balance assessment: Needs assistance Sitting-balance support: Feet supported;No upper extremity supported Sitting balance-Leahy Scale: Good Sitting balance - Comments: sitting EOB without assistance   Standing balance support: Bilateral upper extremity supported;During functional activity Standing balance-Leahy Scale: Poor Standing balance comment: reliant on external support for  dynamic tasks         Cognition Arousal/Alertness: Awake/alert Behavior During Therapy: WFL for tasks assessed/performed Overall Cognitive Status: Within Functional Limits for tasks assessed    Memory: Decreased short-term memory;Decreased recall of precautions Following Commands: Follows one step commands with increased time Safety/Judgement: Decreased awareness of safety;Decreased awareness of deficits   Problem Solving: Slow processing;Difficulty sequencing;Requires verbal cues General Comments: Generally improving safety awareness, but noted to be barefoot in hospital bathroom upon PTA arrival to her room. Agreeable to wear socks with therapist assist to don. Pt apprehensive to try new things but does better than she thinks she will.        Exercises      General Comments General comments (skin integrity, edema, etc.): VSS on RA, no acute s/sx distress on RA      Pertinent Vitals/Pain Pain Assessment: 0-10 Pain Score: 7  Pain Location: RLE initially, reports increase to 8/10 at end of session Pain Descriptors / Indicators: Aching;Constant;Radiating Pain Intervention(s): Monitored during session;Repositioned;Premedicated before session      PT Goals (current goals can now be found in the care plan section) Acute Rehab PT Goals Patient Stated Goal: rehab then home PT Goal Formulation: With patient Time For Goal Achievement: 07/16/21 Progress towards PT goals: Progressing toward goals    Frequency    Min 4X/week      PT Plan Current plan remains appropriate       AM-PAC PT "6 Clicks" Mobility   Outcome Measure  Help needed turning from your back to your side while in a flat bed without using bedrails?: A Little Help needed moving from lying on your back to sitting on the side of a flat bed without using bedrails?: A Little Help needed moving to and from a bed to a chair (including a wheelchair)?: A Little Help needed standing up from a chair using your arms  (e.g., wheelchair or bedside chair)?: A Little Help needed to walk in hospital room?: A Little Help needed climbing 3-5 steps with a railing? : A Lot 6 Click Score: 17    End of Session Equipment Utilized During Treatment: Gait belt Activity Tolerance: Patient tolerated treatment well Patient left: with call bell/phone within reach;in chair;with chair alarm set (pt agreeable to use call bell prior to getting up) Nurse Communication: Mobility status PT Visit Diagnosis: Unsteadiness on feet (R26.81);Muscle weakness (generalized) (M62.81);Other abnormalities of gait and mobility (R26.89);Other symptoms and signs involving the nervous system (R29.898)     Time: 9675-9163 PT Time Calculation (min) (ACUTE ONLY): 18 min  Charges:  $Gait Training: 8-22 mins                     Harmoney Sienkiewicz P., PTA Acute Rehabilitation Services Pager: [phone] Office: [phone]    Angus Palms 07/14/2021, 4:19 PM

## 2021-07-14 NOTE — Progress Notes (Addendum)
PROGRESS NOTE  Lauren Velazquez [id number] DOB: [birth year] DOA: 06/30/2021 PCP: Denton Lank, MD   LOS: 14 days   Brief Narrative / Interim history: Lauren Velazquez, 60 y.o. female with PMH of diabetes hypertension chronic low back pain chronic right leg pain on chronic high-dose opiates, asthma, chronic hypoxic aspiratory failure on 3.5 L nasal cannula presented to [facility] with erratic behavior on 11/5-multiple complaints of chest pain shortness of breath, agitated bizarre behavior, trying to stick her finger down the throat to make her self vomit. Refer to previous ED notes in detail patient was placed on IVC, while in ED became febrile, septic with severely altered mental status concerning for meningitis.  Other differential included viral meningitis sturgeon syndrome opioid withdrawal.  Chest x-ray UA no obvious source of infection.  She had increasing agitation refractory to benzodiazepine then subsequently sedated and intubated and had LP done.  MRI 11/7-showed acute infarct.  Was successfully extubated 11/12. 11/15 transferred to [place] On 11/16 transferred to the floor intermittently drowsy throughout the day then had an episode of complete unresponsiveness with foaming and posturing, acute hypotension, was intubated emergently and transferred to ICU seen by neurology given Keppra and transferred to [facility] for continuous EEG. Subsequently [facility] to [facility]. Waiting on inpatient rehabilitation.  Unfortunately [name] is out of network for her insurance.  Significant events 06/20/21- Admit to ICU  with suspected meningitis s/p mechanical intubation for airway protection in the setting of increased sedation needs and agitation. S/p LP. ID consult. Patient started empirically on vancomycin, ceftriaxone, ampicillin and acyclovir.  ID consult. 06/21/21- MRI w/out contrast shows acute infarcts (embolic vs. Hypotensive). Neuro, and psych consulted.  TTE  showed normal LVEF, grade I diastolic dysfunction, no sig valvular abnormalities and no intracardiac clot.  rEEG showed moderate diffuse slowing with subtle superimposed right focal slowing without epileptiform abnormalities 06/22/21-persistent agitation off of sedation.  Started Depakene for mood stabilization and cyproheptadine for potential serotonergic symptoms. 06/23/21: Remains on the vent and sedated. 06/24/21: Remains on the vent. Neuro eval recs central embolic suspected.  Start ASA 81mg  daily + plavix 75mg  daily x21 days f/b ASA 81mg  daily after that.  Consider TEE 06/25/21: Remains on the vent and sedation.  Status post TEE negative for thrombus or vegetation. 06/26/21: S/p extubation now IVC with sitter at the bedside 06/27/21: Stable on [place] @2L  with fluctuating mental status. Sitter at bedside 06/28/21: Restart home dose of oxycodone for concern of possible withdrawal. EEG per neuro r/o seizure activity. Patient stable, able to be transferred to step down care.  06/29/21: Transferred to Med/surg 06/30/21: Rapid response called for unresponsiveness and hypotension.  Upon arrival to ICU concern for seizure activity.  Intubated for airway protection.  Transferred to [facility] for continuous EEG, PCCM admit 11/17 no further seizures. More scattered punctate infarcts on MRI compared to 11/7  11/18 extubated  11/19 had 2 witnessed seizures, loaded with Keppra, on EEG monitoring  Subjective / 24h Interval events: Complains of right leg pain, tells me is worse than before.  She denies any back pain.  Appreciates her right leg is more swollen  Assessment & Plan: Principal Problem:   Acute metabolic encephalopathy Active Problems:   Acute hypoxemic respiratory failure (HCC)   Diabetes mellitus (HCC)   Seizure (HCC)   Respiratory insufficiency   Encephalopathy acute   Cerebrovascular accident (CVA) ([place])   DVT (deep venous thrombosis) (HCC)   Vaginal bleeding, abnormal   Restrictive lung  disease   OSA (obstructive  sleep apnea)  Principal Problem Acute metabolic encephalopathy-reason for encephalopathy was thought to be medication induced.  She appears back to baseline now, has been placed on Seroquel twice daily  Active Problems Chronic pain on opiates and Neurontin -Home medication list was reviewed.  It looks like she takes Percocet 10/325 1 tablet every 5 hours as needed for pain.  Also noted to be on Neurontin 900 mg 3 times a day.  There is also tramadol listed on her home medication list.  She was complaining of back pain and right leg pain which is chronic.  Recently received steroid injection to the back.  Her narcotics were changed over to every 4 hours as needed. Gabapentin being continued at a lower dose of 600 mg 3 times a day.   Pain is better controlled.  Right lower extremity weakness is from her stroke. -Add lidocaine patch, give furosemide x1 given reported swelling today and add TED hoses  Acute and subacute bilateral punctate infarcts on Bilateral cerebral hemisphere -TEE negative.  Duplex shows left leg with age indeterminate DVT on 07/02/20.  Completed stroke work-up.  Echo shows EF 60 to 65%, TEE no shunt, LDL 133, HbA1c 8.2. Zetia.  Not on statins due to intolerance.  Continue PT OT. On anticoagulation for DVT.  Not on antiplatelet agents.  Seizure episode -Continue Keppra and Vimpat as per neurology.  Continue seizure precaution, no driving x6 months.  Remains seizure-free.   DVT involving left lower extremity, age indeterminate versus chronic  Case was discussed with hematology (Dr. Benay Spice) by previous rounding physician.  They recommended repeating Doppler study and to initiate anticoagulation if positive.  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  Will benefit from 3 months of anticoagulation.   Started on rivaroxaban.  She is tolerating it well without any significant bleeding complications.  Vaginal bleeding has  subsided.   Vaginal bleeding postmenopausal -Underwent a transabdominal ultrasound.  No fibroids were noted.  Cystic lesion of the left ovary or adnexa was noted. GYN consultation in the outpatient setting was recommended. She is not interested in undergoing transvaginal ultrasound.  She would prefer to follow-up with her GYN in [place]. After discussions with GYN, Dr. Ilda Basset patient was started on Megace 40 mg twice daily.  Vaginal bleeding has subsided.  Hemoglobin has been stable. Continue Megace at current dose to patient has had opportunity to follow-up with her gynecologist in [place].     Normocytic anemia/iron deficiency/folic acid deficiency -Noted to have longstanding history of anemia.  Reason for this is not clear. Anemia panel suggested iron deficiency.  She was given iron infusion.  Also noted to have Foley cath deficiency as well as low B12 level.  Started on folic acid and [id number].  Will need iron supplementation at discharge.  Some of the drop in hemoglobin could also be from her vaginal bleeding as discussed above. Hemoglobin has been stable.  Continue to monitor.   Acute on chronic hypoxic respiratory failure extubated 11/18 OSA Chronic restrictive lung disease post COVID Possible aspiration event -Reports using 3.5 L nocturnal oxygen at home which is her current setting. Followed by [facility]. Completed Unasyn for aspiration.  Respiratory status is stable.   Essential hypertension -Currently on amlodipine and lisinopril.  Blood pressure reasonably well controlled for the most part.  Occasional high readings noted.  Continue to monitor for now.  Pain is likely contributing to high blood pressure readings.   Mild to moderate MR  -  Outpatient follow-up   Diabetes mellitus type 2, poorly controlled with hyperglycemia  -HbA1c 9.1. At home she is on Lantus insulin 70 units twice a day along with the NovoLog sliding scale basis. Currently on glargine insulin.  CBGs remain poorly  controlled. -Monitor today's CBGs and probably will need adjustments tomorrow morning  CBG (last 3)  Recent Labs    07/13/21 1720 07/13/21 2110 07/14/21 0625  GLUCAP 418* 325* 282*    Dysphagia  -Resolved.  Now back on carb modified diet.   Urinary retention  -Foley was discontinued on 11/23.  She has been able to void on her own.  Patient is on bethanechol.  Will discontinue at discharge.   Physical deconditioning/debility -PT OT has suggested inpatient rehab TOC on consult.  [facility] is outside of the network.  Awaiting acceptance and transfer to [facility] rehab   Class III Obesity -she would benefit from weight loss, BMI 45   Scheduled Meds:  amLODipine  10 mg Oral Daily   bethanechol  10 mg Oral TID   budesonide  1 mg Nebulization BID   ezetimibe  10 mg Oral Daily   feeding supplement (GLUCERNA SHAKE)  237 mL Oral TID BM   folic acid  1 mg Oral Daily   gabapentin  600 mg Oral TID   insulin aspart  0-20 Units Subcutaneous TID WC   insulin aspart  0-5 Units Subcutaneous QHS   insulin aspart  10 Units Subcutaneous TID WC   insulin glargine-yfgn  64 Units Subcutaneous BID   lacosamide  100 mg Oral BID   levETIRAcetam  1,000 mg Oral BID   lidocaine  1 patch Transdermal Q24H   lisinopril  40 mg Oral Daily   megestrol  40 mg Oral BID   QUEtiapine  50 mg Oral BID   Rivaroxaban  15 mg Oral BID WC   Followed by   Derrill Memo ON 07/28/2021] rivaroxaban  20 mg Oral Q supper   senna-docusate  2 tablet Oral QHS   sodium chloride flush  10-40 mL Intracatheter Q12H   umeclidinium-vilanterol  1 puff Inhalation Daily   vitamin B-12  500 mcg Oral Daily   Continuous Infusions:  sodium chloride Stopped (07/05/21 1206)   PRN Meds:.sodium chloride, albuterol, docusate sodium, hydrALAZINE, oxyCODONE-acetaminophen **AND** oxyCODONE, polyethylene glycol, sodium chloride flush  Diet Orders (From admission, onward)     Start     Ordered   07/08/21 1023  Diet Carb Modified Fluid consistency: Thin;  Room service appropriate? Yes  Diet effective now       Question Answer Comment  Diet-HS Snack? Nothing   Calorie Level Medium 1600-2000   Fluid consistency: Thin   Room service appropriate? Yes      07/08/21 1023            DVT prophylaxis: Place TED hose Start: 07/14/21 [id number] Place and maintain sequential compression device Start: 07/04/21 0850 SCDs Start: 06/30/21 2231 Rivaroxaban (XARELTO) tablet 15 mg  rivaroxaban (XARELTO) tablet 20 mg     Code Status: Full Code  Family Communication: no family at bedside   Status is: Inpatient  Remains inpatient appropriate because: placement pending  Level of care: Med-Surg  Consultants:  Neurology  PCCM   Objective: Vitals:   07/13/21 2003 07/14/21 0016 07/14/21 0412 07/14/21 0811  BP: 133/64 (!) 117/49 (!) 144/76 134/74  Pulse: 96 97 88 94  Resp: 18 18 18 18   Temp: 98.5 F (36.9 C) 97.9 F (36.6 C) 98 F (36.7 C) 98  F (36.7 C)  TempSrc: Oral Oral Oral Oral  SpO2: 96% 94% 95% 97%  Weight:      Height:        Intake/Output Summary (Last 24 hours) at 07/14/2021 1151 Last data filed at 07/14/2021 0900 Gross per 24 hour  Intake 1060 ml  Output --  Net 1060 ml   Filed Weights   07/03/21 0400 07/05/21 0448 07/09/21 0337  Weight: 105.1 kg 111.3 kg 109.4 kg    Examination:  Constitutional: NAD Eyes: no scleral icterus ENMT: Mucous membranes are moist.  Neck: normal, supple Respiratory: clear to auscultation bilaterally, no wheezing, no crackles. Normal respiratory effort.  Cardiovascular: Regular rate and rhythm, no murmurs / rubs / gallops.  Trace lower extremity edema Abdomen: non distended, no tenderness. Bowel sounds positive.  Musculoskeletal: no clubbing / cyanosis.  Skin: no rashes Neurologic: non focal   Data Reviewed: I have independently reviewed following labs and imaging studies   CBC: Recent Labs  Lab 07/09/21 0348 07/10/21 0233 07/11/21 0158 07/12/21 0220 07/14/21 0124  WBC 8.8 9.3  10.3 11.0* 9.2  HGB 8.6* 9.0* 9.3* 9.3* 8.9*  HCT 29.7* 31.0* 31.7* 31.9* 30.2*  MCV 83.4 83.6 82.6 83.3 83.9  PLT 370 366 415* 385 330   Basic Metabolic Panel: Recent Labs  Lab 07/08/21 0113 07/12/21 0220  NA 139 139  K 4.1 4.3  CL 105 106  CO2 26 26  GLUCOSE 239* 311*  BUN 5* 9  CREATININE 0.63 0.71  CALCIUM 9.0 9.3   Liver Function Tests: No results for input(s): AST, ALT, ALKPHOS, BILITOT, PROT, ALBUMIN in the last 168 hours. Coagulation Profile: No results for input(s): INR, PROTIME in the last 168 hours. HbA1C: No results for input(s): HGBA1C in the last 72 hours. CBG: Recent Labs  Lab 07/13/21 0638 07/13/21 1144 07/13/21 1720 07/13/21 2110 07/14/21 0625  GLUCAP 328* 235* 418* 325* 282*    No results found for this or any previous visit (from the past 240 hour(s)).   Radiology Studies: No results found.   Pamella Pert, MD, PhD [facility]  Between 7 am - 7 pm I am available, please contact me via Amion (for emergencies) or Securechat (non urgent messages)  Between 7 pm - 7 am I am not available, please contact night coverage MD/APP via Amion

## 2021-07-14 NOTE — Progress Notes (Addendum)
Inpatient Diabetes Program Recommendations  AACE/ADA: New Consensus Statement on Inpatient Glycemic Control (2015)  Target Ranges:  Prepandial:   less than 140 mg/dL      Peak postprandial:   less than 180 mg/dL (1-2 hours)      Critically ill patients:  140 - 180 mg/dL   Lab Results  Component Value Date   GLUCAP 282 (H) 07/14/2021   HGBA1C 8.2 (H) 06/20/2021    Review of Glycemic Control  Latest Reference Range & Units 07/13/21 06:38 07/13/21 11:44 07/13/21 17:20 07/13/21 21:10 07/14/21 06:25  Glucose-Capillary 70 - 99 mg/dL 056 (H) 979 (H) 480 (H) 325 (H) 282 (H)  (H): Data is abnormally high  Diabetes history: DM2 Outpatient Diabetes medications: Semglee 80 units TID, Novolog 30 units TID Current orders for Inpatient glycemic control: Semglee 64 units BID, Novolog 0-20 units TID and 0-5 QHS, Novolog 10 units TID  Inpatient Diabetes Program Recommendations:    Semglee 80 units BID (give additional 16 now if am dose has already been given) Novolog 12 units TID with meals if eats at least 50%  Will continue to follow while inpatient.  Thank you, Dulce Sellar, RN, BSN Diabetes Coordinator Inpatient Diabetes Program [phone] (team pager from 8a-5p)

## 2021-07-14 NOTE — Progress Notes (Signed)
ANTICOAGULATION CONSULT NOTE -follow up  Pharmacy Consult for Xarelto Indication: DVT  Allergies  Allergen Reactions   Atorvastatin Swelling    Patient Measurements: Height: 5\' 1"  (154.9 cm) Weight: 109.4 kg (241 lb 2.9 oz) IBW/kg (Calculated) : 47.8  Vital Signs: Temp: 98.9 F (37.2 C) (11/30 1218) Temp Source: Oral (11/30 1218) BP: 151/69 (11/30 1218) Pulse Rate: 95 (11/30 1218)  Labs: Recent Labs    07/12/21 0220 07/14/21 0124  HGB 9.3* 8.9*  HCT 31.9* 30.2*  PLT 385 330  CREATININE 0.71  --      Estimated Creatinine Clearance: 87.6 mL/min (by C-G formula based on SCr of 0.71 mg/dL).   Medical History: Past Medical History:  Diagnosis Date   Anemia    not currently under treatment   Anxiety    Asthma    during allergy season   Diabetes mellitus without complication (HCC)    Hypertension    Vertigo     Medications:  Medications Prior to Admission  Medication Sig Dispense Refill Last Dose   albuterol (VENTOLIN HFA) 108 (90 Base) MCG/ACT inhaler Inhale 2 puffs into the lungs every 6 (six) hours as needed.   Past Week   Fluticasone-Umeclidin-Vilant (TRELEGY ELLIPTA) 100-62.5-25 MCG/ACT AEPB Inhale 1 puff into the lungs daily.   Past Week   gabapentin (NEURONTIN) 300 MG capsule Take 900 mg by mouth 3 (three) times daily.   Past Week   insulin glargine (LANTUS) 100 UNIT/ML Solostar Pen Inject 70 Units into the skin 2 (two) times daily.   Past Week   NOVOLOG FLEXPEN 100 UNIT/ML FlexPen Inject 30 Units into the skin 3 (three) times daily.   Past Week   oxyCODONE-acetaminophen (PERCOCET) 10-325 MG tablet Take 1 tablet by mouth every 5 (five) hours as needed for pain.   Past Week   traMADol (ULTRAM) 50 MG tablet Take 50 mg by mouth every 8 (eight) hours as needed.       Assessment: Lauren Velazquez is a 60 yo female admitted 11/6 for acute infarcts. Doppler positive for chronic DVT, pharmacy consulted 11/23 to start Xarelto. Not on anticoagulation prior to  admission. Transitioned from IV heparin to oral Xarelto treatment dosing starting 07/07/21.  Patient is noted to be tolerating  this medication well without any significant bleeding complications.  Vaginal bleeding has subsided.  Hgb stable 9.3 >8.9,  and PLTC wnl/ stable.  Pt has a long standing h/o anemia. Anemia panel suggests iron deficiency anemia.  On 11/25 and 07/10/21 received  Ferrlecit IV 250 mg x2. MD plans to give po iorn supplement at discharge.  Goal of Therapy:  Anticoagulation Monitor platelets by anticoagulation protocol: Yes   Plan:  Xarelto 15 mg PO BID x 21 days started on 07/07/21 then on 07/28/21 will reduce to 20 mg daily with meal. Monitor CBC and for signs and symptoms of bleeding.   Thank you for allowing 07/30/21 to participate in this patients care.  [place], RPh Clinical Pharmacist [phone] 07/14/2021 1:28 PM Please check AMION for all [facility] Pharmacy phone numbers After 10:00 PM, call Main Pharmacy [phone]  **Pharmacist phone directory can be found on [email or web address] listed under [facility]**

## 2021-07-14 NOTE — NC FL2 (Signed)
[place] MEDICAID FL2 LEVEL OF CARE SCREENING TOOL     IDENTIFICATION  Patient Name: Lauren Velazquez Birthdate: 1960/12/14 Sex: female Admission Date (Current Location): 06/30/2021  [facility] and [place] Number:  Chiropodist and Address:  [facility] [place], [address], [place], [place] [address]      Provider Number: [id number]  Attending Physician Name and Address:  Leatha Gilding, MD  Relative Name and Phone Number:       Current Level of Care: Hospital Recommended Level of Care: Skilled Nursing Facility Prior Approval Number:    Date Approved/Denied:   PASRR Number: [id number]  Discharge Plan: SNF    Current Diagnoses: Patient Active Problem List   Diagnosis Date Noted   Cerebrovascular accident (CVA) [facility])    Seizure (HCC) 06/30/2021   Respiratory insufficiency    Encephalopathy acute    Acute delirium 06/21/2021   Altered mental status 06/20/2021   Insulin dependent type 2 diabetes mellitus (HCC) 06/20/2021   Acute sepsis (HCC)    Pneumonia due to COVID-19 virus 07/15/2020   Acute respiratory failure due to COVID-19 (HCC) 07/15/2020   Obesity, Class III, BMI 40-49.9 (morbid obesity) (HCC) 07/15/2020   Chronic prescription opiate use 07/15/2020   Bacterial pneumonia 07/15/2020   Acute hypoxemic respiratory failure due to COVID-19 (HCC) 07/15/2020   Suspected COVID-19 virus infection 03/06/2020   Elevated troponin 03/06/2020   Acute respiratory failure with hypoxia (HCC) 03/05/2020   Diabetes mellitus type 2, uncomplicated (HCC) 05/30/2014   Hyperlipidemia 05/30/2014   Hypertension 05/30/2014   Obesity 05/30/2014   Vertigo 05/30/2014   Carpal tunnel syndrome on both sides 12/27/2013    Orientation RESPIRATION BLADDER Height & Weight     Self, Time, Situation, Place  Normal Continent Weight: 109.4 kg Height:  5\' 1"  (154.9 cm)  BEHAVIORAL SYMPTOMS/MOOD NEUROLOGICAL BOWEL NUTRITION STATUS    Convulsions/Seizures  Continent Diet (carb modifed with thin liquids)  AMBULATORY STATUS COMMUNICATION OF NEEDS Skin   Limited Assist Verbally Normal                       Personal Care Assistance Level of Assistance  Bathing, Feeding, Dressing Bathing Assistance: Limited assistance Feeding assistance: Independent Dressing Assistance: Limited assistance     Functional Limitations Info  Sight, Hearing, Speech Sight Info: Adequate Hearing Info: Adequate Speech Info: Adequate    SPECIAL CARE FACTORS FREQUENCY  PT (By licensed PT), OT (By licensed OT)     PT Frequency: 5x/wk OT Frequency: 5x/wk            Contractures Contractures Info: Not present    Additional Factors Info  Code Status, Allergies, Psychotropic, Insulin Sliding Scale Code Status Info: full Allergies Info: atorvastatin Psychotropic Info: Neurontin 600 mg TID/ Vimpat 100 mg BID/ Seroquel 50 mg BID Insulin Sliding Scale Info: Semglee 64 units BID/ Novolog 0-20 units TID/ Novolog 0-5 units at bedtime/ Novolog 10 units with meals       Current Medications (07/14/2021):  This is the current hospital active medication list Current Facility-Administered Medications  Medication Dose Route Frequency Provider Last Rate Last Admin   0.9 %  sodium chloride infusion   Intravenous PRN 07/16/2021, MD   Stopped at 07/05/21 1206   albuterol (PROVENTIL) (2.5 MG/3ML) 0.083% nebulizer solution 2.5 mg  2.5 mg Nebulization Q2H PRN Bowser, 07/07/21, NP       amLODipine (NORVASC) tablet 10 mg  10 mg Oral Daily Kaylyn Layer,  DO   10 mg at 07/14/21 5883   bethanechol (URECHOLINE) tablet 10 mg  10 mg Oral TID Karie Fetch P, DO   10 mg at 07/14/21 0834   budesonide (PULMICORT) nebulizer solution 1 mg  1 mg Nebulization BID Lanier Clam, NP   0.5 mg at 07/13/21 0851   docusate sodium (COLACE) capsule 100 mg  100 mg Oral BID PRN Gleason, Darcella Gasman, PA-C       ezetimibe (ZETIA) tablet 10 mg  10 mg Oral Daily Karie Fetch P, DO   10 mg at  07/14/21 0836   feeding supplement (GLUCERNA SHAKE) (GLUCERNA SHAKE) liquid 237 mL  237 mL Oral TID BM Osvaldo Shipper, MD   237 mL at 07/12/21 1231   folic acid (FOLVITE) tablet 1 mg  1 mg Oral Daily Osvaldo Shipper, MD   1 mg at 07/14/21 2549   gabapentin (NEURONTIN) tablet 600 mg  600 mg Oral TID Karie Fetch P, DO   600 mg at 07/14/21 8264   hydrALAZINE (APRESOLINE) injection 10-40 mg  10-40 mg Intravenous Q4H PRN Lanier Clam, NP   20 mg at 07/05/21 2209   insulin aspart (novoLOG) injection 0-20 Units  0-20 Units Subcutaneous TID WC Osvaldo Shipper, MD   15 Units at 07/14/21 1225   insulin aspart (novoLOG) injection 0-5 Units  0-5 Units Subcutaneous QHS Osvaldo Shipper, MD   4 Units at 07/13/21 2129   insulin aspart (novoLOG) injection 10 Units  10 Units Subcutaneous TID WC Osvaldo Shipper, MD   10 Units at 07/14/21 1225   insulin glargine-yfgn (SEMGLEE) injection 64 Units  64 Units Subcutaneous BID Osvaldo Shipper, MD   64 Units at 07/14/21 0836   lacosamide (VIMPAT) tablet 100 mg  100 mg Oral BID Osvaldo Shipper, MD   100 mg at 07/14/21 1583   levETIRAcetam (KEPPRA) tablet 1,000 mg  1,000 mg Oral BID Osvaldo Shipper, MD   1,000 mg at 07/14/21 0834   lidocaine (LIDODERM) 5 % 1 patch  1 patch Transdermal Q24H Leatha Gilding, MD   1 patch at 07/14/21 0958   lisinopril (ZESTRIL) tablet 40 mg  40 mg Oral Daily Karie Fetch P, DO   40 mg at 07/14/21 0834   megestrol (MEGACE) tablet 40 mg  40 mg Oral BID Osvaldo Shipper, MD   40 mg at 07/14/21 0837   oxyCODONE-acetaminophen (PERCOCET/ROXICET) 5-325 MG per tablet 1 tablet  1 tablet Oral Q4H PRN Osvaldo Shipper, MD   1 tablet at 07/14/21 1226   And   oxyCODONE (Oxy IR/ROXICODONE) immediate release tablet 5 mg  5 mg Oral Q4H PRN Osvaldo Shipper, MD   5 mg at 07/14/21 1226   polyethylene glycol (MIRALAX / GLYCOLAX) packet 17 g  17 g Oral Daily PRN Gleason, Darcella Gasman, PA-C       QUEtiapine (SEROQUEL) tablet 50 mg  50 mg Oral BID Karie Fetch P, DO    50 mg at 07/14/21 0940   Rivaroxaban (XARELTO) tablet 15 mg  15 mg Oral BID WC Osvaldo Shipper, MD   15 mg at 07/14/21 7680   Followed by   Melene Muller ON 07/28/2021] rivaroxaban (XARELTO) tablet 20 mg  20 mg Oral Q supper Osvaldo Shipper, MD       senna-docusate (Senokot-S) tablet 2 tablet  2 tablet Oral QHS Osvaldo Shipper, MD   2 tablet at 07/12/21 2121   sodium chloride flush (NS) 0.9 % injection 10-40 mL  10-40 mL Intracatheter Q12H Tomma Lightning, MD  10 mL at 07/14/21 0958   sodium chloride flush (NS) 0.9 % injection 10-40 mL  10-40 mL Intracatheter PRN Olalere, Adewale A, MD       umeclidinium-vilanterol (ANORO ELLIPTA) 62.5-25 MCG/ACT 1 puff  1 puff Inhalation Daily Bowser, Kaylyn Layer, NP   1 puff at 07/14/21 0742   vitamin B-12 (CYANOCOBALAMIN) tablet 500 mcg  500 mcg Oral Daily Osvaldo Shipper, MD   500 mcg at 07/14/21 3559     Discharge Medications: Please see discharge summary for a list of discharge medications.  Relevant Imaging Results:  Relevant Lab Results:   Additional Information ss#: [id number]  Kermit Balo, RN

## 2021-07-15 LAB — GLUCOSE, CAPILLARY
Glucose-Capillary: 305 mg/dL — ABNORMAL HIGH (ref 70–99)
Glucose-Capillary: 211 mg/dL — ABNORMAL HIGH (ref 70–99)
Glucose-Capillary: 318 mg/dL — ABNORMAL HIGH (ref 70–99)

## 2021-07-15 LAB — BASIC METABOLIC PANEL
Anion gap: 11 (ref 5–15)
BUN: 12 mg/dL (ref 6–20)
CO2: 19 mmol/L — ABNORMAL LOW (ref 22–32)
Calcium: 8.9 mg/dL (ref 8.9–10.3)
Chloride: 105 mmol/L (ref 98–111)
Creatinine, Ser: 0.69 mg/dL (ref 0.44–1.00)
GFR, Estimated: >60 mL/min (ref >60)
Glucose, Bld: 369 mg/dL — ABNORMAL HIGH (ref 70–99)
Potassium: 4.5 mmol/L (ref 3.5–5.1)
Sodium: 135 mmol/L (ref 135–145)

## 2021-07-15 LAB — CBC
HCT: 31.9 % — ABNORMAL LOW (ref 36.0–46.0)
Hemoglobin: 9.2 g/dL — ABNORMAL LOW (ref 12.0–15.0)
MCH: 24.5 pg — ABNORMAL LOW (ref 26.0–34.0)
MCHC: 28.8 g/dL — ABNORMAL LOW (ref 30.0–36.0)
MCV: 85.1 fL (ref 80.0–100.0)
Platelets: 305 10*3/uL (ref 150–400)
RBC: 3.75 MIL/uL — ABNORMAL LOW (ref 3.87–5.11)
RDW: 17.9 % — ABNORMAL HIGH (ref 11.5–15.5)
WBC: 9.4 10*3/uL (ref 4.0–10.5)
nRBC: 0 % (ref 0.0–0.2)

## 2021-07-15 MED ORDER — FUROSEMIDE 10 MG/ML IJ SOLN
40.0000 mg | Freq: Once | INTRAMUSCULAR | Status: AC
  Administered 2021-07-15: 40 mg via INTRAVENOUS
  Filled 2021-07-15: qty 4

## 2021-07-15 NOTE — TOC Progression Note (Signed)
Transition of Care [facility]) - Progression Note    Patient Details  Name: Lauren Velazquez MRN: [id number] Date of Birth: 04-19-1961  Transition of Care [facility]) CM/SW Contact  Kermit Balo, RN Phone Number: 07/15/2021, 2:25 PM  Clinical Narrative:    Marthe Patch from [facility] that they are declining to offer a bed for patient. CM has not heard back from [facility] IR after messages left.  CM has asked [facility] in [place] [employer] to review and they declined. CM asked [facility] in [place] to review and they have no available beds.  Family asking [place] to look in [place]. CM has contacted [facility] in [place] and faxed them information.  TOC following.   Expected Discharge Plan: IP Rehab Facility Barriers to Discharge: Continued Medical Work up  Expected Discharge Plan and Services Expected Discharge Plan: IP Rehab Facility   Discharge Planning Services: CM Consult Post Acute Care Choice: IP Rehab Living arrangements for the past 2 months: Single Family Home                                       Social Determinants of Health (SDOH) Interventions    Readmission Risk Interventions No flowsheet data found.

## 2021-07-15 NOTE — Progress Notes (Signed)
Inpatient Diabetes Program Recommendations  AACE/ADA: New Consensus Statement on Inpatient Glycemic Control (2015)  Target Ranges:  Prepandial:   less than 140 mg/dL      [facility] postprandial:   less than 180 mg/dL (1-2 hours)      Critically ill patients:  140 - 180 mg/dL   Lab Results  Component Value Date   GLUCAP 305 (H) 07/15/2021   HGBA1C 8.2 (H) 06/20/2021    Review of Glycemic Control  Latest Reference Range & Units 07/14/21 06:25 07/14/21 11:26 07/14/21 15:42 07/14/21 21:56 07/15/21 06:52  Glucose-Capillary 70 - 99 mg/dL 419 (H) 622 (H) 297 (H) 326 (H) 305 (H)  (H): Data is abnormally high  Diabetes history: DM2 Outpatient Diabetes medications: Semglee 80 units TID, Novolog 30 units TID Current orders for Inpatient glycemic control: Semglee 64 units BID, Novolog 0-20 units TID and 0-5 QHS, Novolog 10 units TID  Inpatient Diabetes Program Recommendations:     Semglee 80 units BID (give additional 16 now if am dose has already been given) Novolog 12 units TID with meals if eats at least 50%  Will continue to follow while inpatient.  Thank you, Dulce Sellar, RN, BSN Diabetes Coordinator Inpatient Diabetes Program [phone] (team pager from 8a-5p)

## 2021-07-15 NOTE — Progress Notes (Signed)
PROGRESS NOTE  Lauren Velazquez [id number] DOB: Dec 17, 1960 DOA: 06/30/2021 PCP: Denton Lank, MD   LOS: 15 days   Brief Narrative / Interim history: Lauren Velazquez, 60 y.o. female with PMH of diabetes hypertension chronic low back pain chronic right leg pain on chronic high-dose opiates, asthma, chronic hypoxic aspiratory failure on 3.5 L nasal cannula [facility] with erratic behavior on 11/5-multiple complaints of chest pain shortness of breath, agitated bizarre behavior, trying to stick her finger down the throat to make her self vomit. Refer to previous ED notes in detail patient was placed on IVC, while in ED became febrile, septic with severely altered mental status concerning for meningitis.  Other differential included viral meningitis sturgeon syndrome opioid withdrawal.  Chest x-ray UA no obvious source of infection.  She had increasing agitation refractory to benzodiazepine then subsequently sedated and intubated and had LP done.  MRI 11/7-showed acute infarct.  Was successfully extubated 11/12. 11/15 transferred to [facility] On 11/16 transferred to the floor intermittently drowsy throughout the day then had an episode of complete unresponsiveness with foaming and posturing, acute hypotension, was intubated emergently and transferred to ICU seen by neurology given Keppra and transferred to [facility] for continuous EEG. Subsequently transferred to [facility]. Waiting on inpatient rehabilitation.  Unfortunately [name] is out of network for her insurance.  Significant events 06/20/21- Admit to ICU  with suspected meningitis s/p mechanical intubation for airway protection in the setting of increased sedation needs and agitation. S/p LP. ID consult. Patient started empirically on vancomycin, ceftriaxone, ampicillin and acyclovir.  ID consult. 06/21/21- MRI w/out contrast shows acute infarcts (embolic vs. Hypotensive). Neuro, and psych consulted.  TTE  showed normal LVEF, grade I diastolic dysfunction, no sig valvular abnormalities and no intracardiac clot.  rEEG showed moderate diffuse slowing with subtle superimposed right focal slowing without epileptiform abnormalities 06/22/21-persistent agitation off of sedation.  Started Depakene for mood stabilization and cyproheptadine for potential serotonergic symptoms. 06/23/21: Remains on the vent and sedated. 06/24/21: Remains on the vent. Neuro eval recs central embolic suspected.  Start ASA 81mg  daily + plavix 75mg  daily x21 days f/b ASA 81mg  daily after that.  Consider TEE 06/25/21: Remains on the vent and sedation.  Status post TEE negative for thrombus or vegetation. 06/26/21: S/p extubation now IVC with sitter at the bedside 06/27/21: Stable on Iselin @2L  with fluctuating mental status. Sitter at bedside 06/28/21: Restart home dose of oxycodone for concern of possible withdrawal. EEG per neuro r/o seizure activity. Patient stable, able to be transferred to step down care.  06/29/21: Transferred to Med/surg 06/30/21: Rapid response called for unresponsiveness and hypotension.  Upon arrival to ICU concern for seizure activity.  Intubated for airway protection.  Transferred to [facility] for continuous EEG, PCCM admit 11/17 no further seizures. More scattered punctate infarcts on MRI compared to 11/7  11/18 extubated  11/19 had 2 witnessed seizures, loaded with Keppra, on EEG monitoring  Subjective / 24h Interval events: Right leg pain is a little bit better.  No back pain.  Assessment & Plan: Principal Problem:   Acute metabolic encephalopathy Active Problems:   Acute hypoxemic respiratory failure (HCC)   Diabetes mellitus (HCC)   Seizure (HCC)   Respiratory insufficiency   Encephalopathy acute   Cerebrovascular accident (CVA) ([facility])   DVT (deep venous thrombosis) (HCC)   Vaginal bleeding, abnormal   Restrictive lung disease   OSA (obstructive sleep apnea)  Principal Problem Acute metabolic  encephalopathy-reason for encephalopathy was thought to  be medication induced.  She appears back to baseline now, has been placed on Seroquel twice daily  Active Problems Chronic pain on opiates and Neurontin -Home medication list was reviewed.  It looks like she takes Percocet 10/325 1 tablet every 5 hours as needed for pain.  Also noted to be on Neurontin 900 mg 3 times a day.  There is also tramadol listed on her home medication list.  She was complaining of back pain and right leg pain which is chronic.  Recently received steroid injection to the back.  Her narcotics were changed over to every 4 hours as needed. Gabapentin being continued at a lower dose of 600 mg 3 times a day.   Pain is better controlled.  Right lower extremity weakness is from her stroke. -Continue lidocaine patch, repeat furosemide today due to swelling in her legs, continue compression stocking  Acute and subacute bilateral punctate infarcts on Bilateral cerebral hemisphere -TEE negative.  Duplex shows left leg with age indeterminate DVT on 07/02/20.  Completed stroke work-up.  Echo shows EF 60 to 65%, TEE no shunt, LDL 133, HbA1c 8.2. Zetia.  Not on statins due to intolerance.  Continue PT OT. On anticoagulation for DVT.  Not on antiplatelet agents.  Seizure episode -Continue Keppra and Vimpat as per neurology.  Continue seizure precaution, no driving x6 months.  Remains seizure-free.   DVT involving left lower extremity, age indeterminate versus chronic  Case was discussed with hematology (Dr. Benay Spice) by previous rounding physician.  They recommended repeating Doppler study and to initiate anticoagulation if positive.  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  Will benefit from 3 months of anticoagulation.   Started on rivaroxaban.  She is tolerating it well without any significant bleeding complications.  Vaginal bleeding has subsided.   Vaginal bleeding postmenopausal -Underwent a  transabdominal ultrasound.  No fibroids were noted.  Cystic lesion of the left ovary or adnexa was noted. GYN consultation in the outpatient setting was recommended. She is not interested in undergoing transvaginal ultrasound.  She would prefer to follow-up with her GYN in [place]. After discussions with GYN, Dr. Ilda Basset patient was started on Megace 40 mg twice daily.  Vaginal bleeding has subsided.  Hemoglobin has been stable. Continue Megace at current dose to patient has had opportunity to follow-up with her gynecologist in [place].     Normocytic anemia/iron deficiency/folic acid deficiency -Noted to have longstanding history of anemia.  Reason for this is not clear. Anemia panel suggested iron deficiency.  She was given iron infusion.  Also noted to have Foley cath deficiency as well as low B12 level.  Started on folic acid and [id number].  Will need iron supplementation at discharge.  Some of the drop in hemoglobin could also be from her vaginal bleeding as discussed above. Hemoglobin has been stable.  Continue to monitor.   Acute on chronic hypoxic respiratory failure extubated 11/18 OSA Chronic restrictive lung disease post COVID Possible aspiration event -Reports using 3.5 L nocturnal oxygen at home which is her current setting. Followed by [facility]. Completed Unasyn for aspiration.  Respiratory status is stable.   Essential hypertension -Currently on amlodipine and lisinopril.  Blood pressure reasonably well controlled for the most part.  Occasional high readings noted.  Continue to monitor for now.  Pain is likely contributing to high blood pressure readings.   Mild to moderate MR  -Outpatient follow-up   Diabetes mellitus type 2, poorly controlled with hyperglycemia  -  HbA1c 9.1. At home she is on Lantus insulin 70 units twice a day along with the NovoLog sliding scale basis. Currently on glargine insulin.  CBGs remain poorly controlled. -Monitor today's CBGs and probably will need  adjustments tomorrow morning  CBG (last 3)  Recent Labs    07/14/21 1542 07/14/21 2156 07/15/21 0652  GLUCAP 335* 326* 305*     Dysphagia  -Resolved.  Now back on carb modified diet.   Urinary retention  -Foley was discontinued on 11/23.  She has been able to void on her own.  Patient is on bethanechol.  Will discontinue at discharge.   Physical deconditioning/debility -PT OT has suggested inpatient rehab TOC on consult.  [facility] is outside of the network.  Awaiting acceptance and transfer to [facility] rehab   Class III Obesity -she would benefit from weight loss, BMI 45   Scheduled Meds:  amLODipine  10 mg Oral Daily   bethanechol  10 mg Oral TID   budesonide  1 mg Nebulization BID   ezetimibe  10 mg Oral Daily   feeding supplement (GLUCERNA SHAKE)  237 mL Oral TID BM   folic acid  1 mg Oral Daily   furosemide  40 mg Intravenous Once   gabapentin  600 mg Oral TID   insulin aspart  0-20 Units Subcutaneous TID WC   insulin aspart  0-5 Units Subcutaneous QHS   insulin aspart  10 Units Subcutaneous TID WC   insulin glargine-yfgn  64 Units Subcutaneous BID   lacosamide  100 mg Oral BID   levETIRAcetam  1,000 mg Oral BID   lidocaine  1 patch Transdermal Q24H   lisinopril  40 mg Oral Daily   megestrol  40 mg Oral BID   QUEtiapine  50 mg Oral BID   Rivaroxaban  15 mg Oral BID WC   Followed by   Melene Muller ON 07/28/2021] rivaroxaban  20 mg Oral Q supper   senna-docusate  2 tablet Oral QHS   sodium chloride flush  10-40 mL Intracatheter Q12H   umeclidinium-vilanterol  1 puff Inhalation Daily   vitamin B-12  500 mcg Oral Daily   Continuous Infusions:  sodium chloride Stopped (07/05/21 1206)   PRN Meds:.sodium chloride, albuterol, docusate sodium, hydrALAZINE, oxyCODONE-acetaminophen **AND** oxyCODONE, polyethylene glycol, sodium chloride flush  Diet Orders (From admission, onward)     Start     Ordered   07/08/21 1023  Diet Carb Modified Fluid consistency: Thin; Room service  appropriate? Yes  Diet effective now       Question Answer Comment  Diet-HS Snack? Nothing   Calorie Level Medium 1600-2000   Fluid consistency: Thin   Room service appropriate? Yes      07/08/21 1023            DVT prophylaxis: Place TED hose Start: 07/14/21 3295 Place and maintain sequential compression device Start: 07/04/21 0850 SCDs Start: 06/30/21 2231 Rivaroxaban (XARELTO) tablet 15 mg  rivaroxaban (XARELTO) tablet 20 mg     Code Status: Full Code  Family Communication: no family at bedside   Status is: Inpatient  Remains inpatient appropriate because: placement pending  Level of care: Med-Surg  Consultants:  Neurology  PCCM   Objective: Vitals:   07/14/21 2032 07/15/21 0034 07/15/21 0433 07/15/21 0823  BP: (!) 141/88 129/71 (!) 154/66 136/66  Pulse: 91 (!) 101 92 87  Resp: 18 18 18 18   Temp: 98.4 F (36.9 C) 98.4 F (36.9 C) 98.3 F (36.8 C) 98.1 F (36.7 C)  TempSrc: Oral Oral Oral Oral  SpO2: 96% 95% 100% 97%  Weight:      Height:        Intake/Output Summary (Last 24 hours) at 07/15/2021 1136 Last data filed at 07/14/2021 1200 Gross per 24 hour  Intake 340 ml  Output --  Net 340 ml    Filed Weights   07/03/21 0400 07/05/21 0448 07/09/21 0337  Weight: 105.1 kg 111.3 kg 109.4 kg    Examination:  Constitutional: She is in no distress Eyes: Anicteric ENMT: Moist mucous membranes Neck: normal, supple Respiratory: Clear bilaterally without wheezing or crackles, normal respiratory effort Cardiovascular: Regular rate and rhythm, no murmurs, trace edema Abdomen: soft, NT, ND, bowel sounds positive Musculoskeletal: no clubbing / cyanosis.  Skin: No rashes seen Neurologic: No focal deficits  Data Reviewed: I have independently reviewed following labs and imaging studies   CBC: Recent Labs  Lab 07/10/21 0233 07/11/21 0158 07/12/21 0220 07/14/21 0124 07/15/21 0108  WBC 9.3 10.3 11.0* 9.2 9.4  HGB 9.0* 9.3* 9.3* 8.9* 9.2*  HCT  31.0* 31.7* 31.9* 30.2* 31.9*  MCV 83.6 82.6 83.3 83.9 85.1  PLT 366 415* 385 330 [id number]    Basic Metabolic Panel: Recent Labs  Lab 07/12/21 0220 07/15/21 0108  NA 139 135  K 4.3 4.5  CL 106 105  CO2 26 19*  GLUCOSE 311* 369*  BUN 9 12  CREATININE 0.71 0.69  CALCIUM 9.3 8.9    Liver Function Tests: No results for input(s): AST, ALT, ALKPHOS, BILITOT, PROT, ALBUMIN in the last 168 hours. Coagulation Profile: No results for input(s): INR, PROTIME in the last 168 hours. HbA1C: No results for input(s): HGBA1C in the last 72 hours. CBG: Recent Labs  Lab 07/14/21 0625 07/14/21 1126 07/14/21 1542 07/14/21 2156 07/15/21 0652  GLUCAP 282* 342* 335* 326* 305*     No results found for this or any previous visit (from the past 240 hour(s)).   Radiology Studies: No results found.   Marzetta Board, MD, PhD [facility]  Between 7 am - 7 pm I am available, please contact me via Amion (for emergencies) or Securechat (non urgent messages)  Between 7 pm - 7 am I am not available, please contact night coverage MD/APP via Amion

## 2021-07-15 NOTE — Progress Notes (Addendum)
Physical Therapy Treatment Patient Details Name: Lauren Velazquez MRN: [id number] DOB: 1961/05/17 Today's Date: 07/15/2021   History of Present Illness 60 yo female presenting to [facility] ED on 11/6 with AMS with agitation; suspect viral meningitis. Intubated 11/6-11/12 for airway protection after sedation. Transfered to [facility] on 11/15. EEG negative for ongoing seizures. Rapid response called for unresponsiveness and hypotension on 11/16. 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 Intubated 11/16-11/18. PMH including HTN, T2DM, vertigo, anxiety, chronic back pain, and HLD.    PT Comments    Pt received sitting EOB and agreeable to therapy session. Emphasis on activity pacing and safe use of RW on stairs needing moderate cues for sequencing. Pt demonstrates impulsivity and at times requires redirecting to task to not drift into objects with RW during household distance gait tasks. Continue to recommend AIR upon DC. Pt continues to benefit from PT services to progress toward functional mobility goals.      Recommendations for follow up therapy are one component of a multi-disciplinary discharge planning process, led by the attending physician.  Recommendations may be updated based on patient status, additional functional criteria and insurance authorization.  Follow Up Recommendations  Acute inpatient rehab (3hours/day)     Assistance Recommended at Discharge Frequent or constant Supervision/Assistance  Equipment Recommendations  Rolling walker (2 wheels)    Recommendations for Other Services       Precautions / Restrictions Precautions Precautions: Fall Restrictions Weight Bearing Restrictions: No     Mobility  Bed Mobility  General bed mobility comments: Received sitting EOB and remained in chair at end of session    Transfers Overall transfer level: Needs assistance Equipment used: Rolling walker (2  wheels) Transfers: Sit to/from Stand Sit to Stand: Supervision;Min guard   General transfer comment: from toilet with Supervision using wall rail, then needing min guard for stand>sit to chair without arm rests in PT gym.    Ambulation/Gait Ambulation/Gait assistance: Min guard   Assistive device: Rolling walker (2 wheels) Gait Pattern/deviations: Step-through pattern;Decreased stride length;Trunk flexed;Drifts right/left Gait velocity: decreased     General Gait Details: Cues for RW management, drifts to left/right with horizontal head turns, unable to pivot turn (possibly due to not understanding directions) instead does a wide turn   Stairs Stairs: Yes Stairs assistance: Min assist Stair Management: With walker;Forwards;Step to pattern   General stair comments: pt ascended/descended 6 steps in PT gym (~3-4" height each) with mod cues for safety, RW use and sequencing, poor carryover from yesterday. Pt today states she has 3 + 3 steps at her front entrance   Modified Rankin (Stroke Patients Only) Modified Rankin (Stroke Patients Only) Pre-Morbid Rankin Score: No symptoms Modified Rankin: Moderately severe disability     Balance Overall balance assessment: Needs assistance Sitting-balance support: Feet supported;No upper extremity supported Sitting balance-Leahy Scale: Good Sitting balance - Comments: sitting EOB without assistance   Standing balance support: Bilateral upper extremity supported;During functional activity Standing balance-Leahy Scale: Poor Standing balance comment: reliant on external support for dynamic tasks      Cognition Arousal/Alertness: Awake/alert Behavior During Therapy: WFL for tasks assessed/performed Overall Cognitive Status: Within Functional Limits for tasks assessed      Memory: Decreased short-term memory;Decreased recall of precautions Following Commands: Follows one step commands with increased time Safety/Judgement: Decreased  awareness of safety;Decreased awareness of deficits   Problem Solving: Slow processing;Difficulty sequencing;Requires verbal cues General Comments: Generally improving safety awareness, needs frequent reminders.  Pt apprehensive to try new things but does better than she thinks she will.         General Comments General comments (skin integrity, edema, etc.): VSS on RA, 2/4 DOE      Pertinent Vitals/Pain Pain Assessment: 0-10 Pain Score: 8  Pain Location: RLE Pain Descriptors / Indicators: Aching;Constant;Radiating Pain Intervention(s): Monitored during session;Premedicated before session;Ice applied (Ice to R foot at end of session)     PT Goals (current goals can now be found in the care plan section) Acute Rehab PT Goals Patient Stated Goal: rehab then home PT Goal Formulation: With patient Time For Goal Achievement: 07/16/21 Progress towards PT goals: Progressing toward goals    Frequency    Min 4X/week      PT Plan Current plan remains appropriate       AM-PAC PT "6 Clicks" Mobility   Outcome Measure  Help needed turning from your back to your side while in a flat bed without using bedrails?: A Little Help needed moving from lying on your back to sitting on the side of a flat bed without using bedrails?: A Little Help needed moving to and from a bed to a chair (including a wheelchair)?: A Little Help needed standing up from a chair using your arms (e.g., wheelchair or bedside chair)?: A Little Help needed to walk in hospital room?: A Little Help needed climbing 3-5 steps with a railing? : A Lot (Heavy cues needed) 6 Click Score: 17    End of Session Equipment Utilized During Treatment: Gait belt Activity Tolerance: Patient tolerated treatment well Patient left: with call bell/phone within reach;in chair;with chair alarm set Nurse Communication: Mobility status PT Visit Diagnosis: Unsteadiness on feet (R26.81);Muscle weakness (generalized) (M62.81);Other  abnormalities of gait and mobility (R26.89);Other symptoms and signs involving the nervous system (R29.898)     Time: 0932-3557 PT Time Calculation (min) (ACUTE ONLY): 23 min  Charges:  $Gait Training: 8-22 mins $Therapeutic Activity: 8-22 mins                     Harland German, Student PTA CI: Carly P., PTA  Carly M Poff 07/15/2021, 1:52 PM

## 2021-07-16 LAB — GLUCOSE, CAPILLARY
Glucose-Capillary: 201 mg/dL — ABNORMAL HIGH (ref 70–99)
Glucose-Capillary: 276 mg/dL — ABNORMAL HIGH (ref 70–99)
Glucose-Capillary: 227 mg/dL — ABNORMAL HIGH (ref 70–99)
Glucose-Capillary: 218 mg/dL — ABNORMAL HIGH (ref 70–99)
Glucose-Capillary: 264 mg/dL — ABNORMAL HIGH (ref 70–99)

## 2021-07-16 MED ORDER — INSULIN GLARGINE-YFGN 100 UNIT/ML ~~LOC~~ SOLN
75.0000 [IU] | Freq: Two times a day (BID) | SUBCUTANEOUS | Status: DC
Start: 2021-07-16 — End: 2021-07-20
  Administered 2021-07-16 – 2021-07-20 (×7): 75 [IU] via SUBCUTANEOUS
  Filled 2021-07-16 (×9): qty 0.75

## 2021-07-16 MED ORDER — KETOROLAC TROMETHAMINE 15 MG/ML IJ SOLN
15.0000 mg | Freq: Four times a day (QID) | INTRAMUSCULAR | Status: DC | PRN
  Administered 2021-07-16: 15 mg via INTRAVENOUS
  Filled 2021-07-16 (×2): qty 1

## 2021-07-16 MED ORDER — INSULIN ASPART 100 UNIT/ML IJ SOLN
12.0000 [IU] | Freq: Three times a day (TID) | INTRAMUSCULAR | Status: DC
Start: 2021-07-16 — End: 2021-07-20
  Administered 2021-07-16 – 2021-07-20 (×11): 12 [IU] via SUBCUTANEOUS

## 2021-07-16 MED ORDER — HYDROXYZINE HCL 10 MG PO TABS
10.0000 mg | ORAL_TABLET | Freq: Three times a day (TID) | ORAL | Status: DC | PRN
  Administered 2021-07-20: 10 mg via ORAL
  Filled 2021-07-16 (×2): qty 1

## 2021-07-16 NOTE — TOC Progression Note (Addendum)
Transition of Care [facility]) - Progression Note    Patient Details  Name: Lauren Velazquez MRN: [id number] Date of Birth: 12-04-1960  [facility]) CM/SW Contact  Kermit Balo, RN Phone Number: 07/16/2021, 1:07 PM  Clinical Narrative:    [facility] [place] [facility] declined.  [facility] a bed. Patient has accepted. CM has asked them to start insurance auth.  [facility] #: [phone]  fax: [phone] Daughter also asking about [facility] in [place]. CM left admissions a voicemail. [phone] ([facility]) TOC following.  1552: CM faxed referral to [facility] to review: [phone]   Expected Discharge Plan: IP Rehab Facility Barriers to Discharge: Continued Medical Work up  Expected Discharge Plan and Services Expected Discharge Plan: IP Rehab Facility   Discharge Planning Services: CM Consult Post Acute Care Choice: IP Rehab Living arrangements for the past 2 months: Single Family Home                                       Social Determinants of Health (SDOH) Interventions    Readmission Risk Interventions No flowsheet data found.

## 2021-07-16 NOTE — Progress Notes (Signed)
Occupational Therapy Treatment Patient Details Name: Lauren Velazquez MRN: [id number] DOB: 10/17/1960 Today's Date: 07/16/2021   History of present illness 60 yo female presenting to [facility] ED on 11/6 with AMS with agitation; suspect viral meningitis. Intubated 11/6-11/12 for airway protection after sedation. Transfered to [facility] on 11/15. EEG negative for ongoing seizures. Rapid response called for unresponsiveness and hypotension on 11/16. 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 Intubated 11/16-11/18. PMH including HTN, T2DM, vertigo, anxiety, chronic back pain, and HLD.   OT comments  Session limited by pain, as pt refused to work on anything more than bed mobility, despite education on importance of mobility and exercises she could complete. Pt requiring min A to rise to sitting EOB. RN notified on pt requesting pain meds and RLE shin having a warm red spot. OT will continue to follow acutely.    Recommendations for follow up therapy are one component of a multi-disciplinary discharge planning process, led by the attending physician.  Recommendations may be updated based on patient status, additional functional criteria and insurance authorization.    Follow Up Recommendations  Acute inpatient rehab (3hours/day)    Assistance Recommended at Discharge Frequent or constant Supervision/Assistance  Equipment Recommendations  Other (comment)    Recommendations for Other Services Rehab consult    Precautions / Restrictions Precautions Precautions: Fall Restrictions Weight Bearing Restrictions: No       Mobility Bed Mobility Overal bed mobility: Needs Assistance Bed Mobility: Supine to Sit     Supine to sit: Min assist     General bed mobility comments: Min A to rise to sitting from laying diagonally onbed    Transfers                   General transfer comment: Pt refused transfers this  session due to pain     Balance Overall balance assessment: Needs assistance Sitting-balance support: Feet supported;No upper extremity supported Sitting balance-Leahy Scale: Good Sitting balance - Comments: sitting EOB without assistance                                   ADL either performed or assessed with clinical judgement   ADL Overall ADL's : Needs assistance/impaired                                       General ADL Comments: Pt refusing movement past bed mobility due to pain    Extremity/Trunk Assessment              Vision       Perception     Praxis      Cognition Arousal/Alertness: Awake/alert Behavior During Therapy: WFL for tasks assessed/performed Overall Cognitive Status: Within Functional Limits for tasks assessed                                            Exercises     Shoulder Instructions       General Comments Red spot on RLE shin, warm to touch, RN notified.    Pertinent Vitals/ Pain       Pain Assessment: 0-10 Pain Score: 10-Worst pain ever Pain Location: RLE Pain  Descriptors / Indicators: Aching;Burning;Discomfort;Constant Pain Intervention(s): Limited activity within patient's tolerance;Patient requesting pain meds-RN notified  Home Living                                          Prior Functioning/Environment              Frequency  Min 2X/week        Progress Toward Goals  OT Goals(current goals can now be found in the care plan section)  Progress towards OT goals: Progressing toward goals  Acute Rehab OT Goals Patient Stated Goal: To lessen pain OT Goal Formulation: With patient Time For Goal Achievement: 07/30/21 Potential to Achieve Goals: Fair ADL Goals Pt Will Perform Grooming: with min assist;sitting Pt Will Perform Upper Body Dressing: with mod assist;sitting Pt Will Perform Lower Body Dressing: with min assist;with adaptive  equipment;sit to/from stand Pt Will Transfer to Toilet: with mod assist;stand pivot transfer;bedside commode Additional ADL Goal #1: Pt will follow one step commands 75% of time during ADLs Additional ADL Goal #2: Pt will perform bed mobility with Mod A in preparation for ADLs  Plan Discharge plan remains appropriate    Co-evaluation                 AM-PAC OT "6 Clicks" Daily Activity     Outcome Measure   Help from another person eating meals?: None Help from another person taking care of personal grooming?: A Little Help from another person toileting, which includes using toliet, bedpan, or urinal?: A Lot Help from another person bathing (including washing, rinsing, drying)?: A Lot Help from another person to put on and taking off regular upper body clothing?: A Little Help from another person to put on and taking off regular lower body clothing?: A Lot 6 Click Score: 16    End of Session    OT Visit Diagnosis: Unsteadiness on feet (R26.81);Muscle weakness (generalized) (M62.81)   Activity Tolerance Patient limited by pain   Patient Left in bed;with call bell/phone within reach;with bed alarm set   Nurse Communication Mobility status;Patient requests pain meds        Time: 5456-2563 OT Time Calculation (min): 14 min  Charges: OT General Charges $OT Visit: 1 Visit OT Treatments $Self Care/Home Management : 8-22 mins  Jawana Reagor H., OTR/L Acute Rehabilitation  Jentry Mcqueary Elane Bing Plume 07/16/2021, 3:56 PM

## 2021-07-16 NOTE — Progress Notes (Signed)
PROGRESS NOTE  Lauren Velazquez RCV:[id number] DOB: 03-27-1961 DOA: 06/30/2021 PCP: Hillery Aldo, MD   LOS: 16 days   Brief Narrative / Interim history: Lauren Velazquez, 60 y.o. female with PMH of diabetes hypertension chronic low back pain chronic right leg pain on chronic high-dose opiates, asthma, chronic hypoxic aspiratory failure on 3.5 L nasal cannula presented to [facility] with erratic behavior on 11/5-multiple complaints of chest pain shortness of breath, agitated bizarre behavior, trying to stick her finger down the throat to make her self vomit. Refer to previous ED notes in detail patient was placed on IVC, while in ED became febrile, septic with severely altered mental status concerning for meningitis.  Other differential included viral meningitis sturgeon syndrome opioid withdrawal.  Chest x-ray UA no obvious source of infection.  She had increasing agitation refractory to benzodiazepine then subsequently sedated and intubated and had LP done.  MRI 11/7-showed acute infarct.  Was successfully extubated 11/12. 11/15 transferred to MedSurg On 11/16 transferred to the floor intermittently drowsy throughout the day then had an episode of complete unresponsiveness with foaming and posturing, acute hypotension, was intubated emergently and transferred to ICU seen by neurology given Keppra and transferred to [facility] for continuous EEG. Subsequently transferred to [facility]. Waiting on inpatient rehabilitation.  Unfortunately [facility] inpatient rehabilitation is out of network for her insurance.  Significant events 06/20/21- Admit to ICU  with suspected meningitis s/p mechanical intubation for airway protection in the setting of increased sedation needs and agitation. S/p LP. ID consult. Patient started empirically on vancomycin, ceftriaxone, ampicillin and acyclovir.  ID consult. 06/21/21- MRI w/out contrast shows acute infarcts (embolic vs. Hypotensive). Neuro, and psych consulted.  TTE  showed normal LVEF, grade I diastolic dysfunction, no sig valvular abnormalities and no intracardiac clot.  rEEG showed moderate diffuse slowing with subtle superimposed right focal slowing without epileptiform abnormalities 06/22/21-persistent agitation off of sedation.  Started Depakene for mood stabilization and cyproheptadine for potential serotonergic symptoms. 06/23/21: Remains on the vent and sedated. 06/24/21: Remains on the vent. Neuro eval recs central embolic suspected.  Start ASA 81mg  daily + plavix 75mg  daily x21 days f/b ASA 81mg  daily after that.  Consider TEE 06/25/21: Remains on the vent and sedation.  Status post TEE negative for thrombus or vegetation. 06/26/21: S/p extubation now IVC with sitter at the bedside 06/27/21: Stable on [place] @2L  with fluctuating mental status. Sitter at bedside 06/28/21: Restart home dose of oxycodone for concern of possible withdrawal. EEG per neuro r/o seizure activity. Patient stable, able to be transferred to step down care.  06/29/21: Transferred to Med/surg 06/30/21: Rapid response called for unresponsiveness and hypotension.  Upon arrival to ICU concern for seizure activity.  Intubated for airway protection.  Transferred to [facility] for continuous EEG, PCCM admit 11/17 no further seizures. More scattered punctate infarcts on MRI compared to 11/7  11/18 extubated  11/19 had 2 witnessed seizures, loaded with Keppra, on EEG monitoring  Subjective / 24h Interval events: She complains of severe right leg pain just behind her knee.  States that current regimen is just not working for her, oxycodone is helping but does not 4 hours and she is in severe pain before the next dose is due.  No other complaints.  Assessment & Plan: Principal Problem:   Acute metabolic encephalopathy Active Problems:   Acute hypoxemic respiratory failure (HCC)   Diabetes mellitus (HCC)   Seizure (HCC)   Respiratory insufficiency   Encephalopathy acute   Cerebrovascular accident  (CVA) (HCC)  DVT (deep venous thrombosis) (HCC)   Vaginal bleeding, abnormal   Restrictive lung disease   OSA (obstructive sleep apnea)  Principal Problem Acute metabolic encephalopathy-reason for encephalopathy was thought to be medication induced.  She appears back to baseline now, has been placed on Seroquel twice daily  Active Problems Chronic pain on opiates and Neurontin -Home medication list was reviewed.  It looks like she takes Percocet 10/325 1 tablet every 5 hours as needed for pain.  Also noted to be on Neurontin 900 mg 3 times a day.  There is also tramadol listed on her home medication list.  She was complaining of back pain and right leg pain which is chronic.  Recently received steroid injection to the back.  Her narcotics were changed over to every 4 hours as needed. Gabapentin being continued at a lower dose of 600 mg 3 times a day.   States that the pain is not well controlled today.  There was some swelling in her foot which may have contributed to pain but received Lasix x2.  Added Toradol IV today as an anti-inflammatory component  Acute and subacute bilateral punctate infarcts on Bilateral cerebral hemisphere -TEE negative.  Duplex shows left leg with age indeterminate DVT on 07/02/20.  Completed stroke work-up.  Echo shows EF 60 to 65%, TEE no shunt, LDL 133, HbA1c 8.2. Zetia.  Not on statins due to intolerance.  Continue PT OT. On anticoagulation for DVT.  Not on antiplatelet agents.  Seizure episode -Continue Keppra and Vimpat as per neurology.  Continue seizure precaution, no driving x6 months.  Remains seizure-free.   DVT involving left lower extremity, age indeterminate versus chronic  Case was discussed with hematology (Dr. Benay Spice) by previous rounding physician.  They recommended repeating Doppler study and to initiate anticoagulation if positive.  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  Will benefit from 3 months of  anticoagulation.   Started on rivaroxaban.  She is tolerating it well without any significant bleeding complications.  Vaginal bleeding has subsided.   Vaginal bleeding postmenopausal -Underwent a transabdominal ultrasound.  No fibroids were noted.  Cystic lesion of the left ovary or adnexa was noted. GYN consultation in the outpatient setting was recommended. She is not interested in undergoing transvaginal ultrasound.  She would prefer to follow-up with her GYN in [place]. After discussions with GYN, Dr. Ilda Basset patient was started on Megace 40 mg twice daily.  Vaginal bleeding has subsided.  Hemoglobin has been stable. Continue Megace at current dose to patient has had opportunity to follow-up with her gynecologist in [place].     Normocytic anemia/iron deficiency/folic acid deficiency -Noted to have longstanding history of anemia.  Reason for this is not clear. Anemia panel suggested iron deficiency.  She was given iron infusion.  Also noted to have Foley cath deficiency as well as low B12 level.  Started on folic acid and [id number].  Will need iron supplementation at discharge.  Some of the drop in hemoglobin could also be from her vaginal bleeding as discussed above. Hemoglobin has been stable.  Continue to monitor.   Acute on chronic hypoxic respiratory failure extubated 11/18 OSA Chronic restrictive lung disease post COVID Possible aspiration event -Reports using 3.5 L nocturnal oxygen at home which is her current setting. Followed by [facility]. Completed Unasyn for aspiration.  Respiratory status is stable.   Essential hypertension -Currently on amlodipine and lisinopril.  Blood pressure reasonably well controlled for the most part.  Occasional high readings noted.  Continue to monitor for now.  Pain is likely contributing to high blood pressure readings.   Mild to moderate MR  -Outpatient follow-up   Diabetes mellitus type 2, poorly controlled with hyperglycemia  -HbA1c 9.1. At home  she is on Lantus insulin 70 units twice a day along with the NovoLog sliding scale basis. Currently on glargine insulin.  CBGs remain poorly controlled. -Monitor today's CBGs and probably will need adjustments tomorrow morning  CBG (last 3)  Recent Labs    07/15/21 1641 07/15/21 2031 07/16/21 0610  GLUCAP 318* 201* 276*     Dysphagia  -Resolved.  Now back on carb modified diet.   Urinary retention  -Foley was discontinued on 11/23.  She has been able to void on her own.  Patient is on bethanechol.  Will discontinue at discharge.   Physical deconditioning/debility -PT OT has suggested inpatient rehab TOC on consult.  [facility] is outside of the network.  Awaiting acceptance and transfer to [facility] rehab   Class III Obesity -she would benefit from weight loss, BMI 45   Scheduled Meds:  amLODipine  10 mg Oral Daily   bethanechol  10 mg Oral TID   budesonide  1 mg Nebulization BID   ezetimibe  10 mg Oral Daily   feeding supplement (GLUCERNA SHAKE)  237 mL Oral TID BM   folic acid  1 mg Oral Daily   gabapentin  600 mg Oral TID   insulin aspart  0-20 Units Subcutaneous TID WC   insulin aspart  0-5 Units Subcutaneous QHS   insulin aspart  10 Units Subcutaneous TID WC   insulin glargine-yfgn  64 Units Subcutaneous BID   lacosamide  100 mg Oral BID   levETIRAcetam  1,000 mg Oral BID   lidocaine  1 patch Transdermal Q24H   lisinopril  40 mg Oral Daily   megestrol  40 mg Oral BID   QUEtiapine  50 mg Oral BID   Rivaroxaban  15 mg Oral BID WC   Followed by   Derrill Memo ON 07/28/2021] rivaroxaban  20 mg Oral Q supper   senna-docusate  2 tablet Oral QHS   sodium chloride flush  10-40 mL Intracatheter Q12H   umeclidinium-vilanterol  1 puff Inhalation Daily   vitamin B-12  500 mcg Oral Daily   Continuous Infusions:  sodium chloride Stopped (07/05/21 1206)   PRN Meds:.sodium chloride, albuterol, docusate sodium, hydrALAZINE, ketorolac, oxyCODONE-acetaminophen **AND** oxyCODONE, polyethylene  glycol, sodium chloride flush  Diet Orders (From admission, onward)     Start     Ordered   07/08/21 1023  Diet Carb Modified Fluid consistency: Thin; Room service appropriate? Yes  Diet effective now       Question Answer Comment  Diet-HS Snack? Nothing   Calorie Level Medium 1600-2000   Fluid consistency: Thin   Room service appropriate? Yes      07/08/21 1023            DVT prophylaxis: Place TED hose Start: 07/14/21 [id number] Place and maintain sequential compression device Start: 07/04/21 0850 SCDs Start: 06/30/21 2231 Rivaroxaban (XARELTO) tablet 15 mg  rivaroxaban (XARELTO) tablet 20 mg     Code Status: Full Code  Family Communication: no family at bedside   Status is: Inpatient  Remains inpatient appropriate because: placement pending  Level of care: Med-Surg  Consultants:  Neurology  PCCM   Objective: Vitals:   07/16/21 0023 07/16/21 0314 07/16/21 0901 07/16/21 0918  BP: 115/69 (!) 124/94  115/62  Pulse: 94 98  88  Resp: 20 (!) 21  16  Temp: 98.6 F (37 C) 99.5 F (37.5 C)  97.8 F (36.6 C)  TempSrc: Oral Oral  Oral  SpO2: 97% 96% 98% 99%  Weight:      Height:       No intake or output data in the 24 hours ending 07/16/21 0953  Filed Weights   07/03/21 0400 07/05/21 0448 07/09/21 0337  Weight: 105.1 kg 111.3 kg 109.4 kg    Examination:  Constitutional: NAD Eyes: No scleral icterus ENMT: Moist mucous membranes Neck: normal, supple Respiratory: Clear bilaterally, no wheezing or crackles heart Cardiovascular: Regular rate and rhythm, no murmurs, trace edema Abdomen: Soft, NT, ND, bowel sounds positive Musculoskeletal: no clubbing / cyanosis.  Skin: No rashes seen Neurologic: Nonfocal  Data Reviewed: I have independently reviewed following labs and imaging studies   CBC: Recent Labs  Lab 07/10/21 0233 07/11/21 0158 07/12/21 0220 07/14/21 0124 07/15/21 0108  WBC 9.3 10.3 11.0* 9.2 9.4  HGB 9.0* 9.3* 9.3* 8.9* 9.2*  HCT 31.0* 31.7*  31.9* 30.2* 31.9*  MCV 83.6 82.6 83.3 83.9 85.1  PLT 366 415* 385 330 [id number]    Basic Metabolic Panel: Recent Labs  Lab 07/12/21 0220 07/15/21 0108  NA 139 135  K 4.3 4.5  CL 106 105  CO2 26 19*  GLUCOSE 311* 369*  BUN 9 12  CREATININE 0.71 0.69  CALCIUM 9.3 8.9    Liver Function Tests: No results for input(s): AST, ALT, ALKPHOS, BILITOT, PROT, ALBUMIN in the last 168 hours. Coagulation Profile: No results for input(s): INR, PROTIME in the last 168 hours. HbA1C: No results for input(s): HGBA1C in the last 72 hours. CBG: Recent Labs  Lab 07/15/21 0652 07/15/21 1137 07/15/21 1641 07/15/21 2031 07/16/21 0610  GLUCAP 305* 211* 318* 201* 276*     No results found for this or any previous visit (from the past 240 hour(s)).   Radiology Studies: No results found.   Marzetta Board, MD, PhD [facility]  Between 7 am - 7 pm I am available, please contact me via Amion (for emergencies) or Securechat (non urgent messages)  Between 7 pm - 7 am I am not available, please contact night coverage MD/APP via Amion

## 2021-07-17 LAB — GLUCOSE, CAPILLARY
Glucose-Capillary: 184 mg/dL — ABNORMAL HIGH (ref 70–99)
Glucose-Capillary: 130 mg/dL — ABNORMAL HIGH (ref 70–99)
Glucose-Capillary: 200 mg/dL — ABNORMAL HIGH (ref 70–99)
Glucose-Capillary: 215 mg/dL — ABNORMAL HIGH (ref 70–99)

## 2021-07-17 LAB — BASIC METABOLIC PANEL
Anion gap: 8 (ref 5–15)
BUN: 14 mg/dL (ref 6–20)
CO2: 24 mmol/L (ref 22–32)
Calcium: 8.8 mg/dL — ABNORMAL LOW (ref 8.9–10.3)
Chloride: 106 mmol/L (ref 98–111)
Creatinine, Ser: 0.61 mg/dL (ref 0.44–1.00)
GFR, Estimated: >60 mL/min (ref >60)
Glucose, Bld: 192 mg/dL — ABNORMAL HIGH (ref 70–99)
Potassium: 4.5 mmol/L (ref 3.5–5.1)
Sodium: 138 mmol/L (ref 135–145)

## 2021-07-17 LAB — CBC
HCT: 30.8 % — ABNORMAL LOW (ref 36.0–46.0)
Hemoglobin: 9.2 g/dL — ABNORMAL LOW (ref 12.0–15.0)
MCH: 24.7 pg — ABNORMAL LOW (ref 26.0–34.0)
MCHC: 29.9 g/dL — ABNORMAL LOW (ref 30.0–36.0)
MCV: 82.8 fL (ref 80.0–100.0)
Platelets: 273 10*3/uL (ref 150–400)
RBC: 3.72 MIL/uL — ABNORMAL LOW (ref 3.87–5.11)
RDW: 17.9 % — ABNORMAL HIGH (ref 11.5–15.5)
WBC: 7.6 10*3/uL (ref 4.0–10.5)
nRBC: 0 % (ref 0.0–0.2)

## 2021-07-17 MED ORDER — OXYCODONE HCL 5 MG PO TABS
5.0000 mg | ORAL_TABLET | ORAL | Status: DC | PRN
  Administered 2021-07-17 – 2021-07-18 (×10): 5 mg via ORAL
  Filled 2021-07-17 (×10): qty 1

## 2021-07-17 MED ORDER — FUROSEMIDE 10 MG/ML IJ SOLN
40.0000 mg | Freq: Once | INTRAMUSCULAR | Status: AC
  Administered 2021-07-17: 40 mg via INTRAVENOUS
  Filled 2021-07-17: qty 4

## 2021-07-17 MED ORDER — OXYCODONE-ACETAMINOPHEN 5-325 MG PO TABS
1.0000 | ORAL_TABLET | ORAL | Status: DC | PRN
  Administered 2021-07-17 – 2021-07-18 (×10): 1 via ORAL
  Filled 2021-07-17 (×10): qty 1

## 2021-07-17 NOTE — Progress Notes (Signed)
PROGRESS NOTE  Lauren Velazquez [id number] DOB: 1961-05-16 DOA: 06/30/2021 PCP: Denton Lank, MD   LOS: 17 days   Brief Narrative / Interim history: Lauren Velazquez, 60 y.o. female with PMH of diabetes hypertension chronic low back pain chronic right leg pain on chronic high-dose opiates, asthma, chronic hypoxic aspiratory failure on 3.5 L nasal cannula presented to [facility] with erratic behavior on 11/5-multiple complaints of chest pain shortness of breath, agitated bizarre behavior, trying to stick her finger down the throat to make her self vomit. Refer to previous ED notes in detail patient was placed on IVC, while in ED became febrile, septic with severely altered mental status concerning for meningitis.  Other differential included viral meningitis sturgeon syndrome opioid withdrawal.  Chest x-ray UA no obvious source of infection.  She had increasing agitation refractory to benzodiazepine then subsequently sedated and intubated and had LP done.  MRI 11/7-showed acute infarct.  Was successfully extubated 11/12. 11/15 transferred to [place] On 11/16 transferred to the floor intermittently drowsy throughout the day then had an episode of complete unresponsiveness with foaming and posturing, acute hypotension, was intubated emergently and transferred to ICU seen by neurology given Keppra and transferred to [facility] for continuous EEG. Subsequently transferred to [facility]. Waiting on inpatient rehabilitation.  Unfortunately [name] is out of network for her insurance.  Significant events 06/20/21- Admit to ICU  with suspected meningitis s/p mechanical intubation for airway protection in the setting of increased sedation needs and agitation. S/p LP. ID consult. Patient started empirically on vancomycin, ceftriaxone, ampicillin and acyclovir.  ID consult. 06/21/21- MRI w/out contrast shows acute infarcts (embolic vs. Hypotensive). Neuro, and psych consulted.  TTE  showed normal LVEF, grade I diastolic dysfunction, no sig valvular abnormalities and no intracardiac clot.  rEEG showed moderate diffuse slowing with subtle superimposed right focal slowing without epileptiform abnormalities 06/22/21-persistent agitation off of sedation.  Started Depakene for mood stabilization and cyproheptadine for potential serotonergic symptoms. 06/23/21: Remains on the vent and sedated. 06/24/21: Remains on the vent. Neuro eval recs central embolic suspected.  Start ASA 81mg  daily + plavix 75mg  daily x21 days f/b ASA 81mg  daily after that.  Consider TEE 06/25/21: Remains on the vent and sedation.  Status post TEE negative for thrombus or vegetation. 06/26/21: S/p extubation now IVC with sitter at the bedside 06/27/21: Stable on [place] @2L  with fluctuating mental status. Sitter at bedside 06/28/21: Restart home dose of oxycodone for concern of possible withdrawal. EEG per neuro r/o seizure activity. Patient stable, able to be transferred to step down care.  06/29/21: Transferred to Med/surg 06/30/21: Rapid response called for unresponsiveness and hypotension.  Upon arrival to ICU concern for seizure activity.  Intubated for airway protection.  Transferred to [facility] for continuous EEG, PCCM admit 11/17 no further seizures. More scattered punctate infarcts on MRI compared to 11/7  11/18 extubated  11/19 had 2 witnessed seizures, loaded with Keppra, on EEG monitoring  Subjective / 24h Interval events: Continues to complain of right leg pain just behind her knee.  States that Toradol and lidocaine patch did not work.  Complains of right foot being swollen and would like a dose of Lasix today to see if it helps with the swelling  Assessment & Plan: Principal Problem:   Acute metabolic encephalopathy Active Problems:   Acute hypoxemic respiratory failure (HCC)   Diabetes mellitus ([place])   Seizure ([place])   Respiratory insufficiency   Encephalopathy acute   Cerebrovascular accident (CVA)  ([place])  DVT (deep venous thrombosis) (HCC)   Vaginal bleeding, abnormal   Restrictive lung disease   OSA (obstructive sleep apnea)  Principal Problem Acute metabolic encephalopathy-reason for encephalopathy was thought to be medication induced.  She appears back to baseline now, has been placed on Seroquel twice daily  Active Problems Chronic pain on opiates and Neurontin -Home medication list was reviewed.  It looks like she takes Percocet 10/325 1 tablet every 5 hours as needed for pain.  Also noted to be on Neurontin 900 mg 3 times a day.  There is also tramadol listed on her home medication list.  She was complaining of back pain and right leg pain which is chronic.  Recently received steroid injection to the back. Gabapentin being continued at a lower dose of 600 mg 3 times a day.   -Give Lasix x1 for her right foot swelling.  Failed lidocaine patch and Toradol.  Increase oxycodone to Q 3  Acute and subacute bilateral punctate infarcts on Bilateral cerebral hemisphere -TEE negative.  Duplex shows left leg with age indeterminate DVT on 07/02/20.  Completed stroke work-up.  Echo shows EF 60 to 65%, TEE no shunt, LDL 133, HbA1c 8.2. Zetia.  Not on statins due to intolerance.  Continue PT OT. On anticoagulation for DVT.  Not on antiplatelet agents.  Seizure episode -Continue Keppra and Vimpat as per neurology.  Continue seizure precaution, no driving x6 months.  Remains seizure-free.   DVT involving left lower extremity, age indeterminate versus chronic  Case was discussed with hematology (Dr. Truett Perna) by previous rounding physician.  They recommended repeating Doppler study and to initiate anticoagulation if positive.  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  Will benefit from 3 months of anticoagulation.   Started on rivaroxaban.  She is tolerating it well without any significant bleeding complications.  Vaginal bleeding has subsided.   Vaginal bleeding  postmenopausal -Underwent a transabdominal ultrasound.  No fibroids were noted.  Cystic lesion of the left ovary or adnexa was noted. GYN consultation in the outpatient setting was recommended. She is not interested in undergoing transvaginal ultrasound.  She would prefer to follow-up with her GYN in [place]. After discussions with GYN, Dr. Vergie Living patient was started on Megace 40 mg twice daily.  Vaginal bleeding has subsided.  Hemoglobin has been stable. Continue Megace at current dose to patient has had opportunity to follow-up with her gynecologist in [place].     Normocytic anemia/iron deficiency/folic acid deficiency -Noted to have longstanding history of anemia.  Reason for this is not clear. Anemia panel suggested iron deficiency.  She was given iron infusion.  Also noted to have Foley cath deficiency as well as low B12 level.  Started on folic acid and B12.  Will need iron supplementation at discharge.  Some of the drop in hemoglobin could also be from her vaginal bleeding as discussed above. Hemoglobin has been stable.  Continue to monitor.   Acute on chronic hypoxic respiratory failure extubated 11/18 OSA Chronic restrictive lung disease post COVID Possible aspiration event -Reports using 3.5 L nocturnal oxygen at home which is her current setting. Followed by [facility]. Completed Unasyn for aspiration.  Respiratory status is stable.   Essential hypertension -Currently on amlodipine and lisinopril.  Blood pressure overall stable   Mild to moderate MR  -Outpatient follow-up   Diabetes mellitus type 2, poorly controlled with hyperglycemia  -HbA1c 9.1.  CBGs remain poorly controlled, insulin adjusted 12/2.  Trend better  today  CBG (last 3)  Recent Labs    07/16/21 1714 07/16/21 2125 07/17/21 0636  GLUCAP 218* 264* 184*     Dysphagia  -Resolved.  Now back on carb modified diet.   Urinary retention  -Foley was discontinued on 11/23.  She has been able to void on her own.   Patient is on bethanechol.  Will discontinue at discharge.   Physical deconditioning/debility -PT OT has suggested inpatient rehab TOC on consult.  [facility] is outside of the network.  Awaiting acceptance and transfer to [facility] rehab   Class III Obesity -she would benefit from weight loss, BMI 45   Scheduled Meds:  amLODipine  10 mg Oral Daily   bethanechol  10 mg Oral TID   ezetimibe  10 mg Oral Daily   folic acid  1 mg Oral Daily   gabapentin  600 mg Oral TID   insulin aspart  0-20 Units Subcutaneous TID WC   insulin aspart  0-5 Units Subcutaneous QHS   insulin aspart  12 Units Subcutaneous TID WC   insulin glargine-yfgn  75 Units Subcutaneous BID   lacosamide  100 mg Oral BID   levETIRAcetam  1,000 mg Oral BID   lisinopril  40 mg Oral Daily   megestrol  40 mg Oral BID   QUEtiapine  50 mg Oral BID   Rivaroxaban  15 mg Oral BID WC   Followed by   Derrill Memo ON 07/28/2021] rivaroxaban  20 mg Oral Q supper   senna-docusate  2 tablet Oral QHS   sodium chloride flush  10-40 mL Intracatheter Q12H   umeclidinium-vilanterol  1 puff Inhalation Daily   vitamin B-12  500 mcg Oral Daily   Continuous Infusions:  sodium chloride Stopped (07/05/21 1206)   PRN Meds:.sodium chloride, albuterol, docusate sodium, hydrALAZINE, hydrOXYzine, oxyCODONE-acetaminophen **AND** oxyCODONE, polyethylene glycol, sodium chloride flush  Diet Orders (From admission, onward)     Start     Ordered   07/08/21 1023  Diet Carb Modified Fluid consistency: Thin; Room service appropriate? Yes  Diet effective now       Question Answer Comment  Diet-HS Snack? Nothing   Calorie Level Medium 1600-2000   Fluid consistency: Thin   Room service appropriate? Yes      07/08/21 1023            DVT prophylaxis: Place TED hose Start: 07/14/21 [id number] Place and maintain sequential compression device Start: 07/04/21 0850 SCDs Start: 06/30/21 2231 Rivaroxaban (XARELTO) tablet 15 mg  rivaroxaban (XARELTO) tablet 20 mg      Code Status: Full Code  Family Communication: no family at bedside   Status is: Inpatient  Remains inpatient appropriate because: placement pending  Level of care: Med-Surg  Consultants:  Neurology  PCCM   Objective: Vitals:   07/16/21 2020 07/17/21 0019 07/17/21 0532 07/17/21 0800  BP: (!) 132/50 (!) 117/51 116/66 (!) 144/73  Pulse: 85 92 95 (!) 105  Resp: 17 18 19 18   Temp: 98.2 F (36.8 C) 98.1 F (36.7 C) 98.4 F (36.9 C) 98.1 F (36.7 C)  TempSrc: Oral Oral Oral Oral  SpO2: 96% 97% 93% 96%  Weight:      Height:       No intake or output data in the 24 hours ending 07/17/21 1211  Filed Weights   07/03/21 0400 07/05/21 0448 07/09/21 0337  Weight: 105.1 kg 111.3 kg 109.4 kg    Examination:  Constitutional: No distress Eyes: Anicteric ENMT: Moist mucous membranes Neck: normal,  supple Respiratory: Clear to auscultation bilaterally, no wheezing or crackles heard Cardiovascular: Regular rate and rhythm, no murmurs, trace edema Abdomen: Soft, NT, ND, positive bowel sounds Musculoskeletal: no clubbing / cyanosis.  Skin: No rashes seen Neurologic: No focal deficits  Data Reviewed: I have independently reviewed following labs and imaging studies   CBC: Recent Labs  Lab 07/11/21 0158 07/12/21 0220 07/14/21 0124 07/15/21 0108 07/17/21 0650  WBC 10.3 11.0* 9.2 9.4 7.6  HGB 9.3* 9.3* 8.9* 9.2* 9.2*  HCT 31.7* 31.9* 30.2* 31.9* 30.8*  MCV 82.6 83.3 83.9 85.1 82.8  PLT 415* 385 330 305 [id number]    Basic Metabolic Panel: Recent Labs  Lab 07/12/21 0220 07/15/21 0108 07/17/21 0650  NA 139 135 138  K 4.3 4.5 4.5  CL 106 105 106  CO2 26 19* 24  GLUCOSE 311* 369* 192*  BUN 9 12 14   CREATININE 0.71 0.69 0.61  CALCIUM 9.3 8.9 8.8*    Liver Function Tests: No results for input(s): AST, ALT, ALKPHOS, BILITOT, PROT, ALBUMIN in the last 168 hours. Coagulation Profile: No results for input(s): INR, PROTIME in the last 168 hours. HbA1C: No results for  input(s): HGBA1C in the last 72 hours. CBG: Recent Labs  Lab 07/16/21 0610 07/16/21 1338 07/16/21 1714 07/16/21 2125 07/17/21 0636  GLUCAP 276* 227* 218* 264* 184*     No results found for this or any previous visit (from the past 240 hour(s)).   Radiology Studies: No results found.   Marzetta Board, MD, PhD [facility]  Between 7 am - 7 pm I am available, please contact me via Amion (for emergencies) or Securechat (non urgent messages)  Between 7 pm - 7 am I am not available, please contact night coverage MD/APP via Amion

## 2021-07-17 NOTE — Plan of Care (Signed)
  Problem: Health Behavior/Discharge Planning: Goal: Ability to manage health-related needs will improve Outcome: Progressing   Problem: Activity: Goal: Risk for activity intolerance will decrease Outcome: Progressing   Problem: Nutrition: Goal: Adequate nutrition will be maintained Outcome: Progressing   Problem: Pain Managment: Goal: General experience of comfort will improve Outcome: Progressing   Problem: Safety: Goal: Ability to remain free from injury will improve Outcome: Progressing   Problem: Skin Integrity: Goal: Risk for impaired skin integrity will decrease Outcome: Progressing   

## 2021-07-17 NOTE — Progress Notes (Signed)
ANTICOAGULATION CONSULT NOTE -follow up  Pharmacy Consult for Xarelto Indication: DVT  Allergies  Allergen Reactions   Atorvastatin Swelling    Patient Measurements: Height: 5\' 1"  (154.9 cm) Weight: 109.4 kg (241 lb 2.9 oz) IBW/kg (Calculated) : 47.8  Vital Signs: Temp: 98.1 F (36.7 C) (12/03 0800) Temp Source: Oral (12/03 0800) BP: 144/73 (12/03 0800) Pulse Rate: 105 (12/03 0800)  Labs: Recent Labs    07/15/21 0108 07/17/21 0650  HGB 9.2* 9.2*  HCT 31.9* 30.8*  PLT 305 273  CREATININE 0.69 0.61     Estimated Creatinine Clearance: 87.6 mL/min (by C-G formula based on SCr of 0.61 mg/dL).   Medical History: Past Medical History:  Diagnosis Date   Anemia    not currently under treatment   Anxiety    Asthma    during allergy season   Diabetes mellitus without complication (HCC)    Hypertension    Vertigo     Medications:  Medications Prior to Admission  Medication Sig Dispense Refill Last Dose   albuterol (VENTOLIN HFA) 108 (90 Base) MCG/ACT inhaler Inhale 2 puffs into the lungs every 6 (six) hours as needed.   Past Week   Fluticasone-Umeclidin-Vilant (TRELEGY ELLIPTA) 100-62.5-25 MCG/ACT AEPB Inhale 1 puff into the lungs daily.   Past Week   gabapentin (NEURONTIN) 300 MG capsule Take 900 mg by mouth 3 (three) times daily.   Past Week   insulin glargine (LANTUS) 100 UNIT/ML Solostar Pen Inject 70 Units into the skin 2 (two) times daily.   Past Week   insulin glargine-yfgn (SEMGLEE) 100 UNIT/ML injection Inject 80 Units into the skin 3 (three) times daily.   Past Week   NOVOLOG FLEXPEN 100 UNIT/ML FlexPen Inject 30 Units into the skin 3 (three) times daily.   Past Week   oxyCODONE-acetaminophen (PERCOCET) 10-325 MG tablet Take 1 tablet by mouth every 5 (five) hours as needed for pain.   Past Week   traMADol (ULTRAM) 50 MG tablet Take 50 mg by mouth every 8 (eight) hours as needed.       Assessment: Lauren Velazquez is a 60 yo female admitted 11/6 for acute  infarcts. Doppler positive for chronic DVT, pharmacy consulted 11/23 to start Xarelto. Not on anticoagulation prior to admission. Transitioned from IV heparin to oral Xarelto treatment dosing starting 07/07/21.  Patient is noted to be tolerating  this medication well without any significant bleeding complications.  Vaginal bleeding has subsided.  CBC stable.  Pt has a long standing h/o anemia. Anemia panel suggests iron deficiency anemia.  On 11/25 and 07/10/21 received  Ferrlecit IV 250 mg x2. MD plans to give po iorn supplement at discharge.  Goal of Therapy:  Anticoagulation Monitor platelets by anticoagulation protocol: Yes   Plan:  Xarelto 15 mg PO BID x 21 days started on 07/07/21 then on 07/28/21 will reduce to 20 mg daily with meal. Monitor CBC and for signs and symptoms of bleeding.   Thank you for allowing 07/30/21 to participate in this patients care.  [place], PharmD PGY2 Infectious Diseases Pharmacy Resident   Please check [email or web address] for unit-specific pharmacy phone numbers

## 2021-07-18 ENCOUNTER — Inpatient Hospital Stay (HOSPITAL_COMMUNITY): Payer: BLUE CROSS/BLUE SHIELD

## 2021-07-18 LAB — BASIC METABOLIC PANEL
Anion gap: 7 (ref 5–15)
BUN: 13 mg/dL (ref 6–20)
CO2: 23 mmol/L (ref 22–32)
Calcium: 8.8 mg/dL — ABNORMAL LOW (ref 8.9–10.3)
Chloride: 106 mmol/L (ref 98–111)
Creatinine, Ser: 0.76 mg/dL (ref 0.44–1.00)
GFR, Estimated: >60 mL/min (ref >60)
Glucose, Bld: 217 mg/dL — ABNORMAL HIGH (ref 70–99)
Potassium: 4 mmol/L (ref 3.5–5.1)
Sodium: 136 mmol/L (ref 135–145)

## 2021-07-18 LAB — CBC
HCT: 32.3 % — ABNORMAL LOW (ref 36.0–46.0)
Hemoglobin: 9.9 g/dL — ABNORMAL LOW (ref 12.0–15.0)
MCH: 25.3 pg — ABNORMAL LOW (ref 26.0–34.0)
MCHC: 30.7 g/dL (ref 30.0–36.0)
MCV: 82.6 fL (ref 80.0–100.0)
Platelets: 308 10*3/uL (ref 150–400)
RBC: 3.91 MIL/uL (ref 3.87–5.11)
RDW: 17.9 % — ABNORMAL HIGH (ref 11.5–15.5)
WBC: 8.8 10*3/uL (ref 4.0–10.5)
nRBC: 0 % (ref 0.0–0.2)

## 2021-07-18 LAB — GLUCOSE, CAPILLARY
Glucose-Capillary: 122 mg/dL — ABNORMAL HIGH (ref 70–99)
Glucose-Capillary: 133 mg/dL — ABNORMAL HIGH (ref 70–99)
Glucose-Capillary: 195 mg/dL — ABNORMAL HIGH (ref 70–99)

## 2021-07-18 MED ORDER — POLYETHYLENE GLYCOL 3350 17 G PO PACK
17.0000 g | PACK | Freq: Two times a day (BID) | ORAL | Status: DC
  Administered 2021-07-18 – 2021-07-19 (×2): 17 g via ORAL
  Filled 2021-07-18 (×2): qty 1

## 2021-07-18 MED ORDER — BISACODYL 10 MG RE SUPP
10.0000 mg | Freq: Every day | RECTAL | Status: DC | PRN
  Administered 2021-07-18: 13:00:00 10 mg via RECTAL
  Filled 2021-07-18: qty 1

## 2021-07-18 MED ORDER — SENNOSIDES-DOCUSATE SODIUM 8.6-50 MG PO TABS
2.0000 | ORAL_TABLET | Freq: Two times a day (BID) | ORAL | Status: DC
  Administered 2021-07-18 – 2021-07-19 (×2): 2 via ORAL
  Filled 2021-07-18 (×2): qty 2

## 2021-07-18 MED ORDER — ACETAMINOPHEN 325 MG PO TABS
650.0000 mg | ORAL_TABLET | Freq: Four times a day (QID) | ORAL | Status: DC | PRN
  Administered 2021-07-18: 650 mg via ORAL
  Filled 2021-07-18: qty 2

## 2021-07-18 NOTE — Progress Notes (Signed)
This RN went into pts room to give her night time medications and observed pt was some how confused sitting on the recliner saying  her bed is messed up and twisted, when asked to clarify, she was just saying her bed is twisted, she was observed to have turned the recliner around facing the wall, but she does not remember turning the recliner, she wore her gown twisted on the telephone cord in the room, The CN [place] Link called to witness pt's situation, she was however able to identify where she was, her name and birthday verbalizing that she is not crazy. Pt's room was cleaned up and re arranged, was encouraged to be careful as pt continue to refuse bed alarm activation, including applying breaks on the recliner chair while she is sitting on it, will continue to monitor. [place]-Asidi, Kynslei Art Efe

## 2021-07-18 NOTE — Progress Notes (Signed)
PROGRESS NOTE  Lauren Velazquez [id number] DOB: 1961/05/25 DOA: 06/30/2021 PCP: Denton Lank, MD   LOS: 18 days   Brief Narrative / Interim history: [name], 60 y.o. female with PMH of diabetes hypertension chronic low back pain chronic right leg pain on chronic high-dose opiates, asthma, chronic hypoxic aspiratory failure on 3.5 L nasal cannula presented to [facility] with erratic behavior on 11/5-multiple complaints of chest pain shortness of breath, agitated bizarre behavior, trying to stick her finger down the throat to make her self vomit. Refer to previous ED notes in detail patient was placed on IVC, while in ED became febrile, septic with severely altered mental status concerning for meningitis.  Other differential included viral meningitis sturgeon syndrome opioid withdrawal.  Chest x-ray UA no obvious source of infection.  She had increasing agitation refractory to benzodiazepine then subsequently sedated and intubated and had LP done.  MRI 11/7-showed acute infarct.  Was successfully extubated 11/12. 11/15 transferred to [place] On 11/16 transferred to the floor intermittently drowsy throughout the day then had an episode of complete unresponsiveness with foaming and posturing, acute hypotension, was intubated emergently and transferred to ICU seen by neurology given Keppra and transferred to [facility] for continuous EEG. Subsequently transferred to [facility]. Waiting on inpatient rehabilitation.  Unfortunately [name] is out of network for her insurance.  Significant events 06/20/21- Admit to ICU  with suspected meningitis s/p mechanical intubation for airway protection in the setting of increased sedation needs and agitation. S/p LP. ID consult. Patient started empirically on vancomycin, ceftriaxone, ampicillin and acyclovir.  ID consult. 06/21/21- MRI w/out contrast shows acute infarcts (embolic vs. Hypotensive). Neuro, and psych consulted.  TTE  showed normal LVEF, grade I diastolic dysfunction, no sig valvular abnormalities and no intracardiac clot.  rEEG showed moderate diffuse slowing with subtle superimposed right focal slowing without epileptiform abnormalities 06/22/21-persistent agitation off of sedation.  Started Depakene for mood stabilization and cyproheptadine for potential serotonergic symptoms. 06/23/21: Remains on the vent and sedated. 06/24/21: Remains on the vent. Neuro eval recs central embolic suspected.  Start ASA 81mg  daily + plavix 75mg  daily x21 days f/b ASA 81mg  daily after that.  Consider TEE 06/25/21: Remains on the vent and sedation.  Status post TEE negative for thrombus or vegetation. 06/26/21: S/p extubation now IVC with sitter at the bedside 06/27/21: Stable on [place] @2L  with fluctuating mental status. Sitter at bedside 06/28/21: Restart home dose of oxycodone for concern of possible withdrawal. EEG per neuro r/o seizure activity. Patient stable, able to be transferred to step down care.  06/29/21: Transferred to Med/surg 06/30/21: Rapid response called for unresponsiveness and hypotension.  Upon arrival to ICU concern for seizure activity.  Intubated for airway protection.  Transferred to [facility] for continuous EEG, PCCM admit 11/17 no further seizures. More scattered punctate infarcts on MRI compared to 11/7  11/18 extubated  11/19 had 2 witnessed seizures, loaded with Keppra, on EEG monitoring  Subjective: Patient's pain medication dosage was adjusted yesterday.  States that pain is better controlled.  Feels constipated.  Denies any other complaints at this time   Assessment & Plan:  Acute metabolic encephalopathy Reason for encephalopathy was thought to be medication induced.  She appears back to baseline now, has been placed on Seroquel twice daily  Chronic pain on opiates and Neurontin  Home medication list was reviewed.  It looks like she takes Percocet 10/325 1 tablet every 5 hours as needed for pain.  Also  noted to be on  Neurontin 900 mg 3 times a day.  There is also tramadol listed on her home medication list.  She was complaining of back pain and right leg pain which is chronic.  Recently received steroid injection to the back.  Gabapentin being continued at a lower dose of 600 mg 3 times a day.   Failed lidocaine patch and Toradol.   Oxycodone frequency has been gradually increased in the last week or so.  Currently on every 3 hours as needed.  Patient instructed on appropriate use of this medication.  Was told about the dangers of being on high-dose narcotics. She feels constipated.  We will place her on bowel regimen.    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  Duplex shows left leg with age indeterminate DVT on 07/02/20.  Completed stroke work-up.  Echo shows EF 60 to 65%, TEE no shunt, LDL 133, HbA1c 8.2. Zetia.  Not on statins due to intolerance.  Continue PT OT. On anticoagulation for DVT.  Not on antiplatelet agents.  Seizure episode Continue Keppra and Vimpat as per neurology.  Continue seizure precaution, no driving x6 months.  Remains seizure-free.   DVT involving left lower extremity, age indeterminate versus chronic  Case was discussed with hematology (Dr. Truett Perna) by previous rounding physician. They recommended repeating Doppler study and to initiate anticoagulation if positive.  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  Will benefit from 3 months of anticoagulation.   Started on rivaroxaban.  She is tolerating it well without any significant bleeding complications.  Vaginal bleeding has subsided.   Vaginal bleeding postmenopausal  Underwent a transabdominal ultrasound.  No fibroids were noted.  Cystic lesion of the left ovary or adnexa was noted. GYN consultation in the outpatient setting was recommended. She is not interested in undergoing transvaginal ultrasound.  She would prefer to  follow-up with her GYN in [place]. After discussions with GYN, Dr. Vergie Living, patient was started on Megace 40 mg twice daily.  Vaginal bleeding has subsided.  Hemoglobin has been stable. Continue Megace at current dose until patient has had opportunity to follow-up with her gynecologist in [place].     Normocytic anemia/iron deficiency/folic acid deficiency  Noted to have longstanding history of anemia.  Reason for this is not clear. Anemia panel suggested iron deficiency.  She was given iron infusion.  Also noted to have Foley cath deficiency as well as low B12 level.  Started on folic acid and B12.  Will need iron supplementation at discharge.  Some of the drop in hemoglobin could also be from her vaginal bleeding as discussed above.  Hemoglobin has been stable.   Acute on chronic hypoxic respiratory failure extubated 11/18 OSA Chronic restrictive lung disease post COVID Possible aspiration event -Reports using 3.5 L nocturnal oxygen at home which is her current setting. Followed by [facility]. Completed Unasyn for aspiration.  Respiratory status is stable.   Essential hypertension  Currently on amlodipine and lisinopril.  Blood pressure overall stable   Mild to moderate MR   Outpatient follow-up   Diabetes mellitus type 2, poorly controlled with hyperglycemia   HbA1c 9.1.  Insulin has been adjusted due to poorly controlled CBGs.  Stable for the last 24 hours.  Will not make any adjustments today.  We will see trends and then decide tomorrow.  Patient counseled on dietary compliance.    Dysphagia  -Resolved.  Now back on carb modified diet.  Urinary retention  -Foley was discontinued on 11/23.  She has been able to void on her own.  Patient is on bethanechol.  Will discontinue at discharge.   Physical deconditioning/debility -PT OT has suggested inpatient rehab TOC on consult.  [facility] is outside of the network.  Awaiting acceptance and transfer to [facility] rehab   Class III  Obesity -she would benefit from weight loss, BMI 45  DVT prophylaxis: On rivaroxaban CODE STATUS: Full code Family communication: Discussed with patient Disposition: Waiting on inpatient rehabilitation.  Status is: Inpatient  Remains inpatient appropriate because: Waiting on inpatient rehabilitation due to ambulatory dysfunction from stroke and seizures.         Scheduled Meds:  amLODipine  10 mg Oral Daily   bethanechol  10 mg Oral TID   ezetimibe  10 mg Oral Daily   folic acid  1 mg Oral Daily   gabapentin  600 mg Oral TID   insulin aspart  0-20 Units Subcutaneous TID WC   insulin aspart  0-5 Units Subcutaneous QHS   insulin aspart  12 Units Subcutaneous TID WC   insulin glargine-yfgn  75 Units Subcutaneous BID   lacosamide  100 mg Oral BID   levETIRAcetam  1,000 mg Oral BID   lisinopril  40 mg Oral Daily   megestrol  40 mg Oral BID   QUEtiapine  50 mg Oral BID   Rivaroxaban  15 mg Oral BID WC   Followed by   Derrill Memo ON 07/28/2021] rivaroxaban  20 mg Oral Q supper   senna-docusate  2 tablet Oral QHS   sodium chloride flush  10-40 mL Intracatheter Q12H   umeclidinium-vilanterol  1 puff Inhalation Daily   vitamin B-12  500 mcg Oral Daily   Continuous Infusions:  sodium chloride Stopped (07/05/21 1206)   PRN Meds:.sodium chloride, albuterol, docusate sodium, hydrALAZINE, hydrOXYzine, oxyCODONE-acetaminophen **AND** oxyCODONE, polyethylene glycol, sodium chloride flush  Diet Orders (From admission, onward)     Start     Ordered   07/08/21 1023  Diet Carb Modified Fluid consistency: Thin; Room service appropriate? Yes  Diet effective now       Question Answer Comment  Diet-HS Snack? Nothing   Calorie Level Medium 1600-2000   Fluid consistency: Thin   Room service appropriate? Yes      07/08/21 1023            Consultants:  Neurology  PCCM   Objective: Vitals:   07/18/21 0029 07/18/21 0451 07/18/21 0700 07/18/21 0839  BP: 113/65 136/68 124/69    Pulse: 99 91 88   Resp: 20 20 17    Temp: 99.2 F (37.3 C) 98 F (36.7 C) 98.2 F (36.8 C)   TempSrc: Oral Oral Oral   SpO2: 93% 96% 95% 93%  Weight:      Height:       No intake or output data in the 24 hours ending 07/18/21 1040  Filed Weights   07/03/21 0400 07/05/21 0448 07/09/21 0337  Weight: 105.1 kg 111.3 kg 109.4 kg    Examination:  General appearance: Awake alert.  In no distress Resp: Clear to auscultation bilaterally.  Normal effort Cardio: S1-S2 is normal regular.  No S3-S4.  No rubs murmurs or bruit GI: Abdomen is soft.  Nontender nondistended.  Bowel sounds are present normal.  No masses organomegaly Extremities: No edema.   Neurologic: Alert and oriented x3.  Better mobility of the right lower extremity is noted to    Data Reviewed: I have  independently reviewed following labs and imaging studies   CBC: Recent Labs  Lab 07/12/21 0220 07/14/21 0124 07/15/21 0108 07/17/21 0650  WBC 11.0* 9.2 9.4 7.6  HGB 9.3* 8.9* 9.2* 9.2*  HCT 31.9* 30.2* 31.9* 30.8*  MCV 83.3 83.9 85.1 82.8  PLT 385 330 305 273    Basic Metabolic Panel: Recent Labs  Lab 07/12/21 0220 07/15/21 0108 07/17/21 0650 07/18/21 0227  NA 139 135 138 136  K 4.3 4.5 4.5 4.0  CL 106 105 106 106  CO2 26 19* 24 23  GLUCOSE 311* 369* 192* 217*  BUN 9 12 14 13   CREATININE 0.71 0.69 0.61 0.76  CALCIUM 9.3 8.9 8.8* 8.8*    CBG: Recent Labs  Lab 07/16/21 2125 07/17/21 0636 07/17/21 1210 07/17/21 1643 07/17/21 2124  GLUCAP 264* 184* 130* 200* 215*     Radiology Studies: No results found.   14/03/22 07/18/2021

## 2021-07-18 NOTE — Progress Notes (Signed)
   07/18/21 2236  Provider Notification  Date Provider Notified 07/18/21  Time Provider Notified 2236  Notification Type Page  Notification Reason Change in status (pt had a temp of 101.7)  Provider response See new orders  Date of Provider Response 07/18/21  Time of Provider Response 2245

## 2021-07-18 NOTE — Progress Notes (Signed)
Notified by RN that pt has fever to 101.5 F tonight.  Has chronic low back pain. Reviewed chart Ordered CBC, CXR, UA to evaluate for source.  Tylenol ordered for fever

## 2021-07-19 DIAGNOSIS — I10 Essential (primary) hypertension: Secondary | ICD-10-CM

## 2021-07-19 DIAGNOSIS — N390 Urinary tract infection, site not specified: Secondary | ICD-10-CM

## 2021-07-19 LAB — BASIC METABOLIC PANEL
Anion gap: 7 (ref 5–15)
BUN: 14 mg/dL (ref 6–20)
CO2: 22 mmol/L (ref 22–32)
Calcium: 9.2 mg/dL (ref 8.9–10.3)
Chloride: 104 mmol/L (ref 98–111)
Creatinine, Ser: 0.84 mg/dL (ref 0.44–1.00)
GFR, Estimated: >60 mL/min (ref >60)
Glucose, Bld: 198 mg/dL — ABNORMAL HIGH (ref 70–99)
Potassium: 4.2 mmol/L (ref 3.5–5.1)
Sodium: 133 mmol/L — ABNORMAL LOW (ref 135–145)

## 2021-07-19 LAB — GLUCOSE, CAPILLARY
Glucose-Capillary: 148 mg/dL — ABNORMAL HIGH (ref 70–99)
Glucose-Capillary: 137 mg/dL — ABNORMAL HIGH (ref 70–99)
Glucose-Capillary: 167 mg/dL — ABNORMAL HIGH (ref 70–99)
Glucose-Capillary: 263 mg/dL — ABNORMAL HIGH (ref 70–99)
Glucose-Capillary: 103 mg/dL — ABNORMAL HIGH (ref 70–99)
Glucose-Capillary: 131 mg/dL — ABNORMAL HIGH (ref 70–99)

## 2021-07-19 LAB — CBC
HCT: 31 % — ABNORMAL LOW (ref 36.0–46.0)
Hemoglobin: 9.1 g/dL — ABNORMAL LOW (ref 12.0–15.0)
MCH: 24.3 pg — ABNORMAL LOW (ref 26.0–34.0)
MCHC: 29.4 g/dL — ABNORMAL LOW (ref 30.0–36.0)
MCV: 82.7 fL (ref 80.0–100.0)
Platelets: 284 10*3/uL (ref 150–400)
RBC: 3.75 MIL/uL — ABNORMAL LOW (ref 3.87–5.11)
RDW: 17.9 % — ABNORMAL HIGH (ref 11.5–15.5)
WBC: 7.9 10*3/uL (ref 4.0–10.5)
nRBC: 0 % (ref 0.0–0.2)

## 2021-07-19 LAB — URINALYSIS, ROUTINE W REFLEX MICROSCOPIC
Bilirubin Urine: NEGATIVE
Glucose, UA: NEGATIVE mg/dL
Ketones, ur: NEGATIVE mg/dL
Nitrite: POSITIVE — AB
Protein, ur: 30 mg/dL — AB
Specific Gravity, Urine: 1.016 (ref 1.005–1.030)
WBC, UA: >50 WBC/hpf — ABNORMAL HIGH (ref 0–5)
pH: 5 (ref 5.0–8.0)

## 2021-07-19 LAB — RESP PANEL BY RT-PCR (FLU A&B, COVID) ARPGX2
Influenza A by PCR: NEGATIVE
Influenza B by PCR: NEGATIVE
SARS Coronavirus 2 by RT PCR: NEGATIVE

## 2021-07-19 MED ORDER — MUSCLE RUB 10-15 % EX CREA
TOPICAL_CREAM | CUTANEOUS | Status: DC | PRN
  Administered 2021-07-19: 1 via TOPICAL
  Filled 2021-07-19: qty 85

## 2021-07-19 MED ORDER — SODIUM CHLORIDE 0.9 % IV SOLN
1.0000 g | INTRAVENOUS | Status: DC
  Administered 2021-07-19 – 2021-07-20 (×2): 1 g via INTRAVENOUS
  Filled 2021-07-19 (×2): qty 10

## 2021-07-19 MED ORDER — OXYCODONE-ACETAMINOPHEN 5-325 MG PO TABS
1.0000 | ORAL_TABLET | ORAL | Status: DC | PRN
  Administered 2021-07-19 – 2021-07-20 (×7): 1 via ORAL
  Filled 2021-07-19 (×7): qty 1

## 2021-07-19 MED ORDER — OXYCODONE HCL 5 MG PO TABS
5.0000 mg | ORAL_TABLET | ORAL | Status: DC | PRN
  Administered 2021-07-19 – 2021-07-20 (×7): 5 mg via ORAL
  Filled 2021-07-19 (×7): qty 1

## 2021-07-19 MED ORDER — SODIUM CHLORIDE 0.9 % IV SOLN: INTRAVENOUS | Status: AC

## 2021-07-19 NOTE — Progress Notes (Signed)
PROGRESS NOTE  Lauren Velazquez [id number] DOB: May 14, 1961 DOA: 06/30/2021 PCP: Denton Lank, MD   LOS: 19 days   Brief Narrative / Interim history: Lauren Velazquez, 60 y.o. female with PMH of diabetes hypertension chronic low back pain chronic right leg pain on chronic high-dose opiates, asthma, chronic hypoxic aspiratory failure on 3.5 L nasal cannula presented to [facility] with erratic behavior on 11/5-multiple complaints of chest pain shortness of breath, agitated bizarre behavior, trying to stick her finger down the throat to make her self vomit. Refer to previous ED notes in detail patient was placed on IVC, while in ED became febrile, septic with severely altered mental status concerning for meningitis.  Other differential included viral meningitis sturgeon syndrome opioid withdrawal.  Chest x-ray UA no obvious source of infection.  She had increasing agitation refractory to benzodiazepine then subsequently sedated and intubated and had LP done.  MRI 11/7-showed acute infarct.  Was successfully extubated 11/12. 11/15 transferred to Lauren [facility]quez On 11/16 transferred to the floor intermittently drowsy throughout the day then had an episode of complete unresponsiveness with foaming and posturing, acute hypotension, was intubated emergently and transferred to ICU seen by neurology given Keppra and transferred to Lauren Velazquez for continuous EEG. Subsequently transferred to Lauren Velazquez. Waiting on inpatient [facility]azquez.  Unfortunately Lauren Velazquez is out of network for her insurance.  Significant events 06/20/21- Admit to ICU  with suspected meningitis s/p mechanical intubation for airway protection in the setting of increased sedation needs and agitation. S/p LP. ID consult. Patient started empirically on vancomycin, ceftriaxone, ampicillin and acyclovir.  ID consult. 06/21/21- MRI w/out contrast shows acute infarcts (embolic vs. Hypotensive). Neuro, and psych consulted.  TTE  showed normal LVEF, grade I diastolic dysfunction, no sig valvular abnormalities and no intracardiac clot.  rEEG showed moderate diffuse slowing with subtle superimposed right focal slowing without epileptiform abnormalities 06/22/21-persistent agitation off of sedation.  Started Depakene for mood stabilization and cyproheptadine for potential serotonergic symptoms. 06/23/21: Remains on the vent and sedated. 06/24/21: Remains on the vent. Neuro eval recs central embolic suspected.  Start ASA 81mg  daily + plavix 75mg  daily x21 days f/b ASA 81mg  daily after that.  Consider TEE 06/25/21: Remains on the vent and sedation.  Status post TEE negative for thrombus or vegetation. 06/26/21: S/p extubation now IVC with sitter at the bedside 06/27/21: Stable on [place] @2L  with fluctuating mental status. Sitter at bedside 06/28/21: Restart home dose of oxycodone for concern of possible withdrawal. EEG per neuro r/o seizure activity. Patient stable, able to be transferred to step down care.  06/29/21: Transferred to Med/surg 06/30/21: Rapid response called for unresponsiveness and hypotension.  Upon arrival to ICU concern for seizure activity.  Intubated for airway protection.  Transferred to Lauren Velazquez for continuous EEG, PCCM admit 11/17 no further seizures. More scattered punctate infarcts on MRI compared to 11/7  11/18 extubated  11/19 had 2 witnessed seizures, loaded with Keppra, on EEG monitoring   Subjective: Overnight events noted.  Patient noted to be confused.  Had fever.  UA noted to be abnormal.  Seems to be back to baseline this morning.  Complaining of her usual right-sided leg pain as well as back pain   Assessment & Plan:  Urinary tract infection Noted to be febrile overnight.  WBC noted to be normal.  UA was noted to be abnormal.  She did have Foley catheter during earlier part of this admission.  Chest x-ray did not show any acute findings.  Patient does not have  any respiratory symptoms.  This is likely  urinary tract infection.  Urine cultures will be sent.  Patient started on ceftriaxone.  Acute metabolic encephalopathy Reason for encephalopathy was thought to be medication induced.  She appears back to baseline now, has been placed on Seroquel twice daily  Chronic pain on opiates and Neurontin  Home medication list was reviewed.  It looks like she takes Percocet 10/325 1 tablet every 5 hours as needed for pain.  Also noted to be on Neurontin 900 mg 3 times a day.  There is also tramadol listed on her home medication list.  She was complaining of back pain and right leg pain which is chronic.  Recently received steroid injection to the back.  Gabapentin being continued at a lower dose of 600 mg 3 times a day.   Failed lidocaine patch and Toradol.   Oxycodone frequency has been gradually increased in the last week or so.   Noted to be very somnolent yesterday afternoon.  We will cut back her pain medication to every 4 hours as needed from every 3 hours as needed.  She was educated about this change.   Continue bowel regimen.    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  Duplex shows left leg with age indeterminate DVT on 07/02/20.  Completed stroke work-up.  Echo shows EF 60 to 65%, TEE no shunt, LDL 133, HbA1c 8.2. Zetia.  Not on statins due to intolerance.  On anticoagulation for DVT.  Not on antiplatelet agents.  Seizure episode Continue Keppra and Vimpat as per neurology.  Continue seizure precaution, no driving x6 months.  Remains seizure-free.   DVT involving left lower extremity, age indeterminate versus chronic  Case was discussed with hematology (Dr. Truett Velazquez) by previous rounding physician. They recommended repeating Doppler study and to initiate anticoagulation if positive.  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  Will benefit from 3 months of anticoagulation.   Started on rivaroxaban.  She is  tolerating it well without any significant bleeding complications.  Vaginal bleeding has subsided.   Vaginal bleeding postmenopausal  Underwent a transabdominal ultrasound.  No fibroids were noted.  Cystic lesion of the left ovary or adnexa was noted. GYN consultation in the outpatient setting was recommended. She is not interested in undergoing transvaginal ultrasound.  She would prefer to follow-up with her GYN in [place]. After discussions with GYN, Dr. Vergie Living, patient was started on Megace 40 mg twice daily.  Vaginal bleeding has subsided.  Hemoglobin has been stable. Continue Megace at current dose until patient has had opportunity to follow-up with her gynecologist in [place].     Normocytic anemia/iron deficiency/folic acid deficiency  Noted to have longstanding history of anemia.  Reason for this is not clear. Anemia panel suggested iron deficiency.  She was given iron infusion.  Also noted to have Foley cath deficiency as well as low B12 level.  Started on folic acid and B12.  Will need iron supplementation at discharge.  Some of the drop in hemoglobin could also be from her vaginal bleeding as discussed above.  Hemoglobin has been stable.   Acute on chronic hypoxic respiratory failure extubated 11/18 OSA Chronic restrictive lung disease post COVID Possible aspiration event -Reports using 3.5 L nocturnal oxygen at home which is her current setting. Followed by [facility]. Completed Unasyn for aspiration.  Respiratory status is stable.   Essential hypertension  Currently on amlodipine and  lisinopril.  Blood pressure is reasonably well controlled.  Soft blood pressures noted overnight.  Continue to monitor for now.   Mild to moderate MR   Outpatient follow-up   Diabetes mellitus type 2, poorly controlled with hyperglycemia   HbA1c 9.1.  Insulin has been adjusted due to poorly controlled CBGs.  Dietary noncompliance has been noted.  CBGs are stable.  Dysphagia  -Resolved.  Now  back on carb modified diet.   Urinary retention  -Foley was discontinued on 11/23.  She has been able to void on her own.  We will stop bethanechol since she is still urinating without difficulty.   Physical deconditioning/debility -PT OT has suggested inpatient rehab TOC on consult.  [facility] is outside of the network.  Awaiting placement to alternative Velaz[facility] facility   Class III Obesity -she would benefit from weight loss, BMI 45  DVT prophylaxis: On rivaroxaban CODE STATUS: Full code Family communication: Discussed with patient Disposition: Waiting on inpatient [facility]azquez.  Status is: Inpatient  Remains inpatient appropriate because: Waiting on inpatient Velazquez due to ambulatory dysfunction from stroke and seizures.         Scheduled Meds:  amLODipine  10 mg Oral Daily   bethanechol  10 mg Oral TID   ezetimibe  10 mg Oral Daily   folic acid  1 mg Oral Daily   gabapentin  600 mg Oral TID   insulin aspart  0-20 Units Subcutaneous TID WC   insulin aspart  0-5 Units Subcutaneous QHS   insulin aspart  12 Units Subcutaneous TID WC   insulin glargine-yfgn  75 Units Subcutaneous BID   lacosamide  100 mg Oral BID   levETIRAcetam  1,000 mg Oral BID   lisinopril  40 mg Oral Daily   megestrol  40 mg Oral BID   polyethylene glycol  17 g Oral BID   QUEtiapine  50 mg Oral BID   Rivaroxaban  15 mg Oral BID WC   Followed by   Derrill Memo ON 07/28/2021] rivaroxaban  20 mg Oral Q supper   senna-docusate  2 tablet Oral BID   sodium chloride flush  10-40 mL Intracatheter Q12H   umeclidinium-vilanterol  1 puff Inhalation Daily   vitamin B-12  500 mcg Oral Daily   Continuous Infusions:  sodium chloride Stopped (07/05/21 1206)   cefTRIAXone (ROCEPHIN)  IV 1 g (07/19/21 0858)   PRN Meds:.sodium chloride, acetaminophen, albuterol, bisacodyl, hydrALAZINE, hydrOXYzine, oxyCODONE-acetaminophen **AND** oxyCODONE, sodium chloride flush  Diet Orders (From admission, onward)      Start     Ordered   07/08/21 1023  Diet Carb Modified Fluid consistency: Thin; Room service appropriate? Yes  Diet effective now       Question Answer Comment  Diet-HS Snack? Nothing   Calorie Level Medium 1600-2000   Fluid consistency: Thin   Room service appropriate? Yes      07/08/21 1023            Consultants:  Neurology  PCCM   Objective: Vitals:   07/19/21 0200 07/19/21 0353 07/19/21 0357 07/19/21 0801  BP: 122/67 (!) 83/39 97/74 129/65  Pulse: 98  100 100  Resp: 18  18 20   Temp: 98.2 F (36.8 C)  98.7 F (37.1 C) 99.9 F (37.7 C)  TempSrc: Axillary  Oral Oral  SpO2: 93%  97% 92%  Weight:      Height:       No intake or output data in the 24 hours ending 07/19/21 [place]  07/03/21 0400 07/05/21 0448 07/09/21 0337  Weight: 105.1 kg 111.3 kg 109.4 kg    Examination:  General appearance: Awake alert.  In no distress Resp: Clear to auscultation bilaterally.  Normal effort Cardio: S1-S2 is normal regular.  No S3-S4.  No rubs murmurs or bruit GI: Abdomen is soft.  Nontender nondistended.  Bowel sounds are present normal.  No masses organomegaly Extremities: No edema.  Moving all of her extremities.  Able to flex and extend the right knee. Neurologic: Alert and oriented x3.  No focal neurological deficits.  Improved strength of the right lower extremity noted.      Data Reviewed: I have independently reviewed following labs and imaging studies   CBC: Recent Labs  Lab 07/14/21 0124 07/15/21 0108 07/17/21 0650 07/18/21 2256 07/19/21 0119  WBC 9.2 9.4 7.6 8.8 7.9  HGB 8.9* 9.2* 9.2* 9.9* 9.1*  HCT 30.2* 31.9* 30.8* 32.3* 31.0*  MCV 83.9 85.1 82.8 82.6 82.7  PLT 330 305 273 308 [id number]    Basic Metabolic Panel: Recent Labs  Lab 07/15/21 0108 07/17/21 0650 07/18/21 0227 07/19/21 0119  NA 135 138 136 133*  K 4.5 4.5 4.0 4.2  CL 105 106 106 104  CO2 19* 24 23 22   GLUCOSE 369* 192* 217* 198*  BUN 12 14 13 14   CREATININE 0.69  0.61 0.76 0.84  CALCIUM 8.9 8.8* 8.8* 9.2    CBG: Recent Labs  Lab 07/17/21 2124 07/18/21 1220 07/18/21 1601 07/18/21 2102 07/19/21 0629  GLUCAP 215* 122* 133* 195* 137*     Radiology Studies: DG CHEST PORT 1 VIEW  Result Date: 07/18/2021 CLINICAL DATA:  Unexplained fever. EXAM: PORTABLE CHEST 1 VIEW COMPARISON:  Portable chest 06/30/2021. FINDINGS: Interval extubation and removal of NGT. Lung volumes are better than previously. No focal pneumonia or pleural effusion is seen. There is mild-to-moderate cardiomegaly with normal caliber central vasculature and calcifications again present in the aortic arch. Osteopenia and thoracic spondylosis. IMPRESSION: Cardiomegaly with no evidence of acute chest disease. Electronically Signed   By: Telford Nab M.D.   On: 07/18/2021 23:20     Bonnielee Haff 07/19/2021

## 2021-07-19 NOTE — Progress Notes (Addendum)
Pt seen walking out of the room at 0350 bare footed saying she was going to the bathroom seems disoriented, this RN tried to re direct pt back to the room, as she was turning to go back into her room, she slipped and fell on her knees at the entrance of her room. Pt assisted back to bed with assistance of the Charge RN, small abbration observed on her left knee, cleaned and bandaid applied, pt denied any other discomfort at this time, Dr Tim Lair (on call) paged and notified, Pt refused any family notification, Bed alarm activated at this time and pt encouraged to call for help as needed. Lauren Velazquez, Lauren Velazquez

## 2021-07-19 NOTE — TOC Progression Note (Signed)
Transition of Care [facility]) - Progression Note    Patient Details  Name: Lauren Velazquez MRN: [id number] Date of Birth: Jan 11, 1961  [facility]) CM/SW Contact  Kermit Balo, RN Phone Number: 07/19/2021, 2:21 PM  Clinical Narrative:    Patient has insurance approval through [place] for [facility] in [place].  Per MD pt is not medically ready d/t fevers.  Daughter will provide transport to the facility once medically ready for d/c.  TOC following.   Expected Discharge Plan: IP Rehab Facility Barriers to Discharge: Continued Medical Work up  Expected Discharge Plan and Services Expected Discharge Plan: IP Rehab Facility   Discharge Planning Services: CM Consult Post Acute Care Choice: IP Rehab Living arrangements for the past 2 months: Single Family Home                                       Social Determinants of Health (SDOH) Interventions    Readmission Risk Interventions No flowsheet data found.

## 2021-07-19 NOTE — Progress Notes (Signed)
PT Cancellation Note  Patient Details Name: Lauren Velazquez MRN: [id number] DOB: 1961/06/24   Cancelled Treatment:    Reason Eval/Treat Not Completed: (P) Fatigue/lethargy limiting ability to participate. Attempted to rouse patient to participate in therapy session however pt remains lethargic. Assisted pt to don blanket as she c/o feeling cold and did not have blankets/sheets on. Will continue efforts per PT plan of care as schedule permits.   Dorathy Kinsman Rasaan Brotherton 07/19/2021, 10:41 AM

## 2021-07-20 LAB — CBC
HCT: 27.8 % — ABNORMAL LOW (ref 36.0–46.0)
Hemoglobin: 8.1 g/dL — ABNORMAL LOW (ref 12.0–15.0)
MCH: 24.4 pg — ABNORMAL LOW (ref 26.0–34.0)
MCHC: 29.1 g/dL — ABNORMAL LOW (ref 30.0–36.0)
MCV: 83.7 fL (ref 80.0–100.0)
Platelets: 238 10*3/uL (ref 150–400)
RBC: 3.32 MIL/uL — ABNORMAL LOW (ref 3.87–5.11)
RDW: 17.8 % — ABNORMAL HIGH (ref 11.5–15.5)
WBC: 6.6 10*3/uL (ref 4.0–10.5)
nRBC: 0 % (ref 0.0–0.2)

## 2021-07-20 LAB — BASIC METABOLIC PANEL
Anion gap: 8 (ref 5–15)
BUN: 11 mg/dL (ref 6–20)
CO2: 22 mmol/L (ref 22–32)
Calcium: 8.7 mg/dL — ABNORMAL LOW (ref 8.9–10.3)
Chloride: 108 mmol/L (ref 98–111)
Creatinine, Ser: 0.68 mg/dL (ref 0.44–1.00)
GFR, Estimated: >60 mL/min (ref >60)
Glucose, Bld: 166 mg/dL — ABNORMAL HIGH (ref 70–99)
Potassium: 4.1 mmol/L (ref 3.5–5.1)
Sodium: 138 mmol/L (ref 135–145)

## 2021-07-20 LAB — GLUCOSE, CAPILLARY
Glucose-Capillary: 146 mg/dL — ABNORMAL HIGH (ref 70–99)
Glucose-Capillary: 174 mg/dL — ABNORMAL HIGH (ref 70–99)

## 2021-07-20 MED ORDER — LEVETIRACETAM 1000 MG PO TABS: 1000.0000 mg | ORAL_TABLET | Freq: Two times a day (BID) | ORAL | Status: DC

## 2021-07-20 MED ORDER — MUSCLE RUB 10-15 % EX CREA
1.0000 | TOPICAL_CREAM | CUTANEOUS | 0 refills | Status: DC | PRN
Start: 2021-07-20 — End: 2022-03-01

## 2021-07-20 MED ORDER — INSULIN ASPART 100 UNIT/ML IJ SOLN: INTRAMUSCULAR | 11 refills | Status: DC

## 2021-07-20 MED ORDER — LACOSAMIDE 100 MG PO TABS: 100.0000 mg | ORAL_TABLET | Freq: Two times a day (BID) | ORAL | Status: DC

## 2021-07-20 MED ORDER — POLYETHYLENE GLYCOL 3350 17 G PO PACK
17.0000 g | PACK | Freq: Two times a day (BID) | ORAL | 0 refills | Status: DC
Start: 2021-07-20 — End: 2022-03-01

## 2021-07-20 MED ORDER — CEFPODOXIME PROXETIL 200 MG PO TABS: 200.0000 mg | ORAL_TABLET | Freq: Two times a day (BID) | ORAL | Status: AC

## 2021-07-20 MED ORDER — BISACODYL 10 MG RE SUPP: 10.0000 mg | Freq: Every day | RECTAL | 0 refills | Status: DC | PRN

## 2021-07-20 MED ORDER — FERROUS SULFATE 325 (65 FE) MG PO TABS
325.0000 mg | ORAL_TABLET | Freq: Two times a day (BID) | ORAL | Status: DC
  Administered 2021-07-20: 325 mg via ORAL
  Filled 2021-07-20: qty 1

## 2021-07-20 MED ORDER — LISINOPRIL 10 MG PO TABS
10.0000 mg | ORAL_TABLET | Freq: Every day | ORAL | Status: DC
  Administered 2021-07-20: 10 mg via ORAL
  Filled 2021-07-20 (×2): qty 1

## 2021-07-20 MED ORDER — RIVAROXABAN 15 MG PO TABS: 15.0000 mg | ORAL_TABLET | Freq: Two times a day (BID) | ORAL | Status: DC

## 2021-07-20 MED ORDER — LISINOPRIL 10 MG PO TABS: 10.0000 mg | ORAL_TABLET | Freq: Every day | ORAL | Status: DC

## 2021-07-20 MED ORDER — AMLODIPINE BESYLATE 5 MG PO TABS: 5.0000 mg | ORAL_TABLET | Freq: Every day | ORAL | Status: AC

## 2021-07-20 MED ORDER — SENNOSIDES-DOCUSATE SODIUM 8.6-50 MG PO TABS: 2.0000 | ORAL_TABLET | Freq: Two times a day (BID) | ORAL | Status: DC

## 2021-07-20 MED ORDER — FERROUS SULFATE 325 (65 FE) MG PO TABS: 325.0000 mg | ORAL_TABLET | Freq: Two times a day (BID) | ORAL | 3 refills | Status: DC

## 2021-07-20 MED ORDER — QUETIAPINE FUMARATE 50 MG PO TABS: 50.0000 mg | ORAL_TABLET | Freq: Two times a day (BID) | ORAL | 0 refills | Status: DC

## 2021-07-20 MED ORDER — EZETIMIBE 10 MG PO TABS: 10.0000 mg | ORAL_TABLET | Freq: Every day | ORAL | Status: AC

## 2021-07-20 MED ORDER — RIVAROXABAN 20 MG PO TABS: 20.0000 mg | ORAL_TABLET | Freq: Every day | ORAL | Status: DC

## 2021-07-20 MED ORDER — FOLIC ACID 1 MG PO TABS: 1.0000 mg | ORAL_TABLET | Freq: Every day | ORAL | Status: DC

## 2021-07-20 MED ORDER — QUETIAPINE FUMARATE 50 MG PO TABS: 50.0000 mg | ORAL_TABLET | Freq: Two times a day (BID) | ORAL | Status: DC

## 2021-07-20 MED ORDER — INSULIN ASPART 100 UNIT/ML IJ SOLN: 12.0000 [IU] | Freq: Three times a day (TID) | INTRAMUSCULAR | 11 refills | Status: AC

## 2021-07-20 MED ORDER — AMLODIPINE BESYLATE 5 MG PO TABS
5.0000 mg | ORAL_TABLET | Freq: Every day | ORAL | Status: DC
  Administered 2021-07-20: 5 mg via ORAL
  Filled 2021-07-20: qty 1

## 2021-07-20 MED ORDER — GABAPENTIN 600 MG PO TABS: 600.0000 mg | ORAL_TABLET | Freq: Three times a day (TID) | ORAL | Status: DC

## 2021-07-20 MED ORDER — MEGESTROL ACETATE 40 MG PO TABS: 40.0000 mg | ORAL_TABLET | Freq: Two times a day (BID) | ORAL | Status: DC

## 2021-07-20 MED ORDER — OXYCODONE-ACETAMINOPHEN 10-325 MG PO TABS: 1.0000 | ORAL_TABLET | ORAL | 0 refills | Status: DC | PRN

## 2021-07-20 MED ORDER — LISINOPRIL 40 MG PO TABS: 40.0000 mg | ORAL_TABLET | Freq: Every day | ORAL | Status: DC

## 2021-07-20 MED ORDER — CYANOCOBALAMIN 500 MCG PO TABS: 500.0000 ug | ORAL_TABLET | Freq: Every day | ORAL | Status: AC

## 2021-07-20 MED ORDER — AMLODIPINE BESYLATE 10 MG PO TABS: 10.0000 mg | ORAL_TABLET | Freq: Every day | ORAL | Status: DC

## 2021-07-20 MED ORDER — LACOSAMIDE 100 MG PO TABS: 100.0000 mg | ORAL_TABLET | Freq: Two times a day (BID) | ORAL | 0 refills | Status: DC

## 2021-07-20 NOTE — TOC Transition Note (Signed)
Transition of Care [facility]) - CM/SW Discharge Note   Patient Details  Name: Lauren Velazquez MRN: [id number] Date of Birth: 1961-07-22  [facility] CM/SW Contact:  Kermit Balo, RN Phone Number: 07/20/2021, 10:01 AM   Clinical Narrative:    Patient is discharging to [facility] in [place], [place]. Daughter is providing the transport. Summary sent to facility and bedside RN updated. D/c packet is at the desk.   Number for report: [phone]   Final next level of care: Skilled Nursing Facility Barriers to Discharge: No Barriers Identified   Patient Goals and CMS Choice   CMS Medicare.gov Compare Post Acute Care list provided to:: Patient Choice offered to / list presented to : Patient, Adult Children  Discharge Placement              Patient chooses bed at:  [facility] in [place]) Patient to be transferred to facility by: Daughter Name of family member notified: Amber--daughter Patient and family notified of of transfer: 07/20/21  Discharge Plan and Services   Discharge Planning Services: CM Consult Post Acute Care Choice: IP Rehab                               Social Determinants of Health (SDOH) Interventions     Readmission Risk Interventions No flowsheet data found.

## 2021-07-20 NOTE — Discharge Summary (Addendum)
[facility]  Physician Discharge Summary   Patient ID: Lauren Velazquez MRN: [id number] DOB/AGE: 58/19/1962 58 y.o.  Admit date: 06/30/2021 Discharge date:   07/20/2021   PCP: Denton Lank, MD  DISCHARGE DIAGNOSES:  Seizures Acute stroke DVT involving left lower extremity Urinary tract infection Chronic pain syndrome on opioids Neurontin Acute metabolic encephalopathy, resolved Postmenopausal vaginal bleeding requiring outpatient evaluation Normocytic anemia Iron deficiency Folic acid deficiency Chronic respiratory failure with hypoxia on home oxygen Chronic restrictive lung disease post COVID Obstructive sleep apnea Diabetes mellitus type 2, poorly controlled with hyperglycemia Essential hypertension   RECOMMENDATIONS FOR OUTPATIENT FOLLOW UP: Patient will need to be referred to neurology for follow-up regarding acute stroke as well as seizure Patient will need referral to GYN for postmenopausal bleeding.  She remains on Megace CBC and basic metabolic panel to be checked early next week Urine culture report is still pending    Home Health: Going to SNF Equipment/Devices: None  CODE STATUS: Full code  DISCHARGE CONDITION: fair  Diet recommendation: Modified carbohydrate  INITIAL HISTORY: Lauren Velazquez, 60 y.o. female with PMH of diabetes hypertension chronic low back pain chronic right leg pain on chronic high-dose opiates, asthma, chronic hypoxic aspiratory failure on 3.5 L nasal cannula presented to [facility] with erratic behavior on 11/5-multiple complaints of chest pain shortness of breath, agitated bizarre behavior, trying to stick her finger down the throat to make her self vomit. Refer to previous ED notes in detail patient was placed on IVC, while in ED became febrile, septic with severely altered mental status concerning for meningitis.  Other differential included viral meningitis sturgeon syndrome opioid withdrawal.  Chest x-ray UA no obvious source of  infection.  She had increasing agitation refractory to benzodiazepine then subsequently sedated and intubated and had LP done.  MRI 11/7-showed acute infarct.  Was successfully extubated 11/12. 11/15 transferred to [place] On 11/16 transferred to the floor intermittently drowsy throughout the day then had an episode of complete unresponsiveness with foaming and posturing, acute hypotension, was intubated emergently and transferred to ICU seen by neurology given Keppra and transferred to [facility] for continuous EEG. [facility]. Waiting on inpatient rehabilitation.  Unfortunately [name] is out of network for her insurance.   Significant events 06/20/21- Admit to ICU  with suspected meningitis s/p mechanical intubation for airway protection in the setting of increased sedation needs and agitation. S/p LP. ID consult. Patient started empirically on vancomycin, ceftriaxone, ampicillin and acyclovir.  ID consult. 06/21/21- MRI w/out contrast shows acute infarcts (embolic vs. Hypotensive). Neuro, and psych consulted.  TTE showed normal LVEF, grade I diastolic dysfunction, no sig valvular abnormalities and no intracardiac clot.  rEEG showed moderate diffuse slowing with subtle superimposed right focal slowing without epileptiform abnormalities 06/22/21-persistent agitation off of sedation.  Started Depakene for mood stabilization and cyproheptadine for potential serotonergic symptoms. 06/23/21: Remains on the vent and sedated. 06/24/21: Remains on the vent. Neuro eval recs central embolic suspected.  Start ASA 81mg  daily + plavix 75mg  daily x21 days f/b ASA 81mg  daily after that.  Consider TEE 06/25/21: Remains on the vent and sedation.  Status post TEE negative for thrombus or vegetation. 06/26/21: S/p extubation now IVC with sitter at the bedside 06/27/21: Stable on [place] @2L  with fluctuating mental status. Sitter at bedside 06/28/21: Restart home  dose of oxycodone for concern of possible withdrawal. EEG per neuro r/o seizure activity. Patient stable, able to be transferred to step down care.  06/29/21: Transferred to  Med/surg 06/30/21: Rapid response called for unresponsiveness and hypotension.  Upon arrival to ICU concern for seizure activity.  Intubated for airway protection.  Transferred to [facility] for continuous EEG, PCCM admit 11/17 no further seizures. More scattered punctate infarcts on MRI compared to 11/7  11/18 extubated  11/19 had 2 witnessed seizures, loaded with Keppra, on EEG monitoring Subsequently stabilized and transferred to the floor    Consultations: Neurology Critical care [place]:   Acute and subacute bilateral punctate infarcts on Bilateral cerebral hemisphere  Seen by neurology.  Underwent stroke work-up.  TEE negative.  Lower extremity venous Doppler shows left leg with age indeterminate DVT on 07/02/20. Echo shows EF 60 to 65%, TEE no shunt, LDL 133, HbA1c 8.2. Zetia.  Not on statins due to intolerance.  On anticoagulation for DVT.  Not on antiplatelet agents.   Seizure episode Continue Keppra and Vimpat as per neurology.  Continue seizure precaution, no driving x6 months.  Remains seizure-free.  Outpatient follow-up with neurology.  Urinary tract infection Patient was noted to be febrile on 12/4.  WBC was normal.  Chest x-ray was unremarkable.  UA noted to be remarkably abnormal.  She did have a Foley catheter during earlier part of this hospital stay.  Urine cultures were sent.  Patient was started on ceftriaxone.  Fevers have resolved.  She feels well this morning without any nausea vomiting abdominal pain or dysuria.  No hematuria.  Will be transition to cefpodoxime on discharge.  Will need to follow-up on results of urine culture.    ADDENDUM Urine culture grew out ESBL Klebsiella but only 70,000 colonies.  This positive result was conveyed to the [facility] nursing staff to  communicate to the other providers so that this can be addressed appropriately.  My recommendation would be to use fosfomycin.   Chronic pain on opiates and Neurontin  Home medication list was reviewed.  It looks like she takes Percocet 10/325 1 tablet every 5 hours as needed for pain.  Also noted to be on Neurontin 900 mg 3 times a day.  There is also tramadol listed on her home medication list.  She was complaining of back pain and right leg pain which is chronic.  Recently received steroid injection to the back.  Gabapentin being continued at a lower dose of 600 mg 3 times a day.   Failed lidocaine patch and Toradol.   Tramadol has been discontinued.  Continue to use oxycodone and gabapentin. Placed on laxatives.  She did have a bowel movement yesterday.   DVT involving left lower extremity, age indeterminate versus chronic  Case was discussed with hematology (Dr. Benay Spice) by previous rounding physician. They recommended repeating Doppler study and to initiate anticoagulation if positive.  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  Will benefit from 3 months of anticoagulation.   Started on rivaroxaban.  She is tolerating it well without any significant bleeding complications.  Vaginal bleeding has subsided.   Vaginal bleeding postmenopausal  Underwent a transabdominal ultrasound.  No fibroids were noted.  Cystic lesion of the left ovary or adnexa was noted. GYN consultation in the outpatient setting was recommended. She is not interested in undergoing transvaginal ultrasound.  She would prefer to follow-up with her GYN in [place]. After discussions with GYN, Dr. Ilda Basset, patient was started on Megace 40 mg twice daily.  Vaginal bleeding has subsided.  Hemoglobin has been stable. Continue Megace at current dose until  patient has had opportunity to follow-up with her gynecologist in [place].     Normocytic anemia/iron deficiency/folic acid deficiency   Noted to have longstanding history of anemia.  Reason for this is not clear. Anemia panel suggested iron deficiency.  She was given iron infusion.  Also noted to have Foley cath deficiency as well as low B12 level.  Started on folic acid and [id number].  Will need iron supplementation at discharge.  Some of the drop in hemoglobin could also be from her vaginal bleeding as discussed above.  Hemoglobin will need to be monitored closely at skilled nursing facility.   Essential hypertension  Currently on amlodipine and lisinopril.  Soft blood pressures noted over the last 24 hours.  We will cut back on the dose of her amlodipine and lisinopril.  Diabetes mellitus type 2, poorly controlled with hyperglycemia   HbA1c 9.1.  Continue long-acting insulin along with meal coverage and sliding scale coverage.  Monitor CBGs.  Acute on chronic hypoxic respiratory failure extubated 11/18 OSA Chronic restrictive lung disease post COVID Possible aspiration event Reports using 3.5 L nocturnal oxygen as needed at home which is her current setting. Followed by [facility]. Completed Unasyn for aspiration.  Respiratory status is stable.   Acute metabolic encephalopathy Reason for encephalopathy was thought to be medication induced.  She is now back to baseline.  Currently on Seroquel twice a day which can be tapered down gradually.   Mild to moderate MR   Outpatient follow-up   Dysphagia  -Resolved.  Now back on carb modified diet.   Urinary retention  -Foley was discontinued on 11/23.  She has been able to void on her own.     Obesity Estimated body mass index is 41.28 kg/m as calculated from the following:   Height as of this encounter: 5\' 1"  (1.549 m).   Weight as of this encounter: 99.1 kg.  Patient is stable.  Okay for discharge to rehab/skilled nursing facility.  PERTINENT LABS:  The results of significant diagnostics from this hospitalization (including imaging, microbiology, ancillary and  laboratory) are listed below for reference.    Microbiology: Recent Results (from the past 240 hour(s))  Urine Culture     Status: Abnormal (Preliminary result)   Collection Time: 07/19/21 12:11 PM   Specimen: Urine, Clean Catch  Result Value Ref Range Status   Specimen Description URINE, CLEAN CATCH  Final   Special Requests NONE  Final   Culture (A)  Final    70,000 COLONIES/mL GRAM NEGATIVE RODS SUSCEPTIBILITIES TO FOLLOW Performed at [facility], [address], [place],  [address]    Report Status PENDING  Incomplete  Resp Panel by RT-PCR (Flu A&B, Covid) Nasopharyngeal Swab     Status: None   Collection Time: 07/19/21  2:24 PM   Specimen: Nasopharyngeal Swab; Nasopharyngeal(NP) swabs in vial transport medium  Result Value Ref Range Status   SARS Coronavirus 2 by RT PCR NEGATIVE NEGATIVE Final    Comment: (NOTE) SARS-CoV-2 target nucleic acids are NOT DETECTED.  The SARS-CoV-2 RNA is generally detectable in upper respiratory specimens during the acute phase of infection. The lowest concentration of SARS-CoV-2 viral copies this assay can detect is 138 copies/mL. A negative result does not preclude SARS-Cov-2 infection and should not be used as the sole basis for treatment or other patient management decisions. A negative result may occur with  improper specimen collection/handling, submission of specimen other than nasopharyngeal swab, presence of viral mutation(s) within the areas targeted  by this assay, and inadequate number of viral copies(<138 copies/mL). A negative result must be combined with clinical observations, patient history, and epidemiological information. The expected result is Negative.  Fact Sheet for Patients:  EntrepreneurPulse.com.au  Fact Sheet for Healthcare Providers:  IncredibleEmployment.be  This test is no t yet approved or cleared by the [place] FDA and  has been authorized for detection  and/or diagnosis of SARS-CoV-2 by FDA under an Emergency Use Authorization (EUA). This EUA will remain  in effect (meaning this test can be used) for the duration of the COVID-19 declaration under Section 564(b)(1) of the Act, 21 U.S.C.section 360bbb-3(b)(1), unless the authorization is terminated  or revoked sooner.       Influenza A by PCR NEGATIVE NEGATIVE Final   Influenza B by PCR NEGATIVE NEGATIVE Final    Comment: (NOTE) The Xpert Xpress SARS-CoV-2/FLU/RSV plus assay is intended as an aid in the diagnosis of influenza from Nasopharyngeal swab specimens and should not be used as a sole basis for treatment. Nasal washings and aspirates are unacceptable for Xpert Xpress SARS-CoV-2/FLU/RSV testing.  Fact Sheet for Patients: EntrepreneurPulse.com.au  Fact Sheet for Healthcare Providers: IncredibleEmployment.be  This test is not yet approved or cleared by the [place] FDA and has been authorized for detection and/or diagnosis of SARS-CoV-2 by FDA under an Emergency Use Authorization (EUA). This EUA will remain in effect (meaning this test can be used) for the duration of the COVID-19 declaration under Section 564(b)(1) of the Act, 21 U.S.C. section 360bbb-3(b)(1), unless the authorization is terminated or revoked.  Performed at [facility], [place] [address] [place], [place] [address]      Labs:  COVID-19 Labs   Lab Results  Component Value Date   SARSCOV2NAA NEGATIVE 07/19/2021   [place] NEGATIVE 06/19/2021   [place] NEGATIVE 03/06/2020   [place] NEGATIVE 03/05/2020      Basic Metabolic Panel: Recent Labs  Lab 07/15/21 0108 07/17/21 0650 07/18/21 0227 07/19/21 0119 07/20/21 0211  NA 135 138 136 133* 138  K 4.5 4.5 4.0 4.2 4.1  CL 105 106 106 104 108  CO2 19* 24 23 22 22   GLUCOSE 369* 192* 217* 198* 166*  BUN 12 14 13 14 11   CREATININE 0.69 0.61 0.76 0.84 0.68  CALCIUM 8.9 8.8* 8.8* 9.2 8.7*     CBC: Recent Labs  Lab 07/15/21 0108 07/17/21 0650 07/18/21 2256 07/19/21 0119 07/20/21 0211  WBC 9.4 7.6 8.8 7.9 6.6  HGB 9.2* 9.2* 9.9* 9.1* 8.1*  HCT 31.9* 30.8* 32.3* 31.0* 27.8*  MCV 85.1 82.8 82.6 82.7 83.7  PLT 305 273 308 284 238    CBG: Recent Labs  Lab 07/19/21 1159 07/19/21 1611 07/19/21 2126 07/19/21 2300 07/20/21 0754  GLUCAP 167* 263* 103* 131* 146*     IMAGING STUDIES CT ANGIO HEAD NECK W WO CM  Result Date: 06/30/2021 CLINICAL DATA:  Neuro deficit, stroke suspected EXAM: CT ANGIOGRAPHY HEAD AND NECK TECHNIQUE: Multidetector CT imaging of the head and neck was performed using the standard protocol during bolus administration of intravenous contrast. Multiplanar CT image reconstructions and MIPs were obtained to evaluate the vascular anatomy. Carotid stenosis measurements (when applicable) are obtained utilizing NASCET criteria, using the distal internal carotid diameter as the denominator. CONTRAST:  62mL OMNIPAQUE IOHEXOL 350 MG/ML SOLN COMPARISON:  06/19/2021 FINDINGS: CT HEAD FINDINGS Brain: No evidence of acute infarction, hemorrhage, cerebral edema, mass, mass effect, or midline shift. No hydrocephalus or extra-axial fluid collection. The previously noted infarcts on the  06/21/2021 MRI are not visualized on the CT. Vascular: No hyperdense vessel. Skull: Normal. Negative for fracture or focal lesion. Sinuses/Orbits: Mild mucosal thickening right maxillary sinus and ethmoid air cells. Other: The mastoid air cells are well aerated. Review of the MIP images confirms the above findings CTA NECK FINDINGS Aortic arch: Standard branching. Imaged portion shows no evidence of aneurysm or dissection. No significant stenosis of the major arch vessel origins. Right carotid system: No evidence of dissection, stenosis (50% or greater) or occlusion. Left carotid system: No evidence of dissection, stenosis (50% or greater) or occlusion. Vertebral arteries: Codominant. No  evidence of dissection, stenosis (50% or greater) or occlusion. Skeleton: No acute osseous abnormality. Other neck: Patient is intubated and has an orogastric tube. Upper chest: Right upper lobe opacity, possibly atelectatic lung. Linear atelectasis in the left upper lobe. Review of the MIP images confirms the above findings CTA HEAD FINDINGS Anterior circulation: Both internal carotid arteries are patent to the termini, without stenosis or other abnormality. A1 segments patent. Normal anterior communicating artery. Anterior cerebral arteries are patent to their distal aspects. No M1 stenosis or occlusion. Normal MCA bifurcations. Distal MCA branches perfused and symmetric. Posterior circulation: Vertebral arteries patent to the vertebrobasilar junction without stenosis. Posterior inferior cerebral arteries patent bilaterally. Basilar patent to its distal aspect. Superior cerebellar arteries patent bilaterally. Near fetal origins of the bilateral PCAs, with patent bilateral posterior communicating arteries. PCAs perfused to their distal aspects without stenosis. Venous sinuses: As permitted by contrast timing, patent. Anatomic variants: None significant Review of the MIP images confirms the above findings IMPRESSION: 1. No acute intracranial process. 2. No hemodynamically significant stenosis in the neck. 3. No intracranial large vessel occlusion or significant stenosis. 4. Right upper lung opacity, possibly atelectatic lung. These results were called by telephone at the time of interpretation on 06/30/2021 at 5:40 pm to provider [facility] , who verbally acknowledged these results. Electronically Signed   By: Merilyn Baba M.D.   On: 06/30/2021 17:47   DG Abd 1 View  Result Date: 06/30/2021 CLINICAL DATA:  Check gastric tube placement EXAM: ABDOMEN - 1 VIEW COMPARISON:  06/21/2021 FINDINGS: Gastric catheter is noted within the distal stomach directed towards the pylorus. No free air is seen. No obstructive  changes are noted. IMPRESSION: Gastric catheter in the distal stomach. Electronically Signed   By: Inez Catalina M.D.   On: 06/30/2021 15:43   DG Abd 1 View  Result Date: 06/21/2021 CLINICAL DATA:  Enteric tube placement EXAM: ABDOMEN - 1 VIEW COMPARISON:  None. FINDINGS: Motion artifacts limit evaluation. Tip of enteric tube is seen in the region of antrum of the stomach. There is presence of gas in nondilated small bowel loops. IMPRESSION: Tip of NG tube is seen in the region of antrum of the stomach. Electronically Signed   By: Elmer Picker M.D.   On: 06/21/2021 12:50   MR ANGIO HEAD WO CONTRAST  Result Date: 06/25/2021 CLINICAL DATA:  Initial evaluation for carotid stenosis screening. EXAM: MRA NECK WITHOUT AND WITH CONTRAST MRA HEAD WITHOUT CONTRAST TECHNIQUE: Multiplanar and multiecho pulse sequences of the neck were obtained without and with intravenous contrast. Angiographic images of the neck were obtained using MRA technique without and with intravenous contrast; Angiographic images of the Circle of Willis were obtained using MRA technique without intravenous contrast. CONTRAST:  23mL GADAVIST GADOBUTROL 1 MMOL/ML IV SOLN COMPARISON:  Comparison made with prior brain MRI from 06/21/2021 FINDINGS: MRA NECK FINDINGS AORTIC ARCH: Visualized aortic arch  normal caliber with normal branch pattern. No hemodynamically significant stenosis about the origin of the great vessels. RIGHT CAROTID SYSTEM: Right CCA patent from its origin to the bifurcation without stenosis. Atheromatous narrowing at the right carotid bulb/proximal right ICA with associated stenosis of up to 40-50% by NASCET criteria (series 1126, image 1). Right ICA otherwise patent distally without stenosis, evidence for dissection or occlusion. LEFT CAROTID SYSTEM: Left CCA patent from its origin to the bifurcation without stenosis. No significant atheromatous narrowing or irregularity about the left carotid bulb. Left ICA patent  distally without stenosis, evidence for dissection or occlusion. VERTEBRAL ARTERIES: Both vertebral arteries arise from the subclavian arteries. Vertebral arteries are largely codominant. Vertebral arteries mildly tortuous but are widely patent without stenosis evidence for dissection or occlusion. MRA HEAD FINDINGS ANTERIOR CIRCULATION: Distal cervical segments of the internal carotid arteries are widely patent with antegrade flow. Petrous, cavernous, and supraclinoid segments widely patent without stenosis or other abnormality. A1 segments patent bilaterally. Normal anterior communicating artery complex. Mild irregularity about the ACAs favored to be related to motion artifact. ACAs patent to their distal aspects without significant stenosis. No M1 stenosis or occlusion. Normal MCA bifurcations. Distal MCA branches well perfused and symmetric. POSTERIOR CIRCULATION: Both vertebral arteries patent to the vertebrobasilar junction without stenosis. Both PICA origins patent and normal. Basilar patent to its distal aspect without stenosis. Superior cerebellar arteries patent bilaterally. Both PCAs supplied via hypoplastic P1 segments and robust bilateral posterior communicating arteries. PCAs well perfused to their distal aspects without stenosis. No intracranial aneurysm. IMPRESSION: MRA HEAD IMPRESSION: Negative intracranial MRA. No large vessel occlusion or hemodynamically significant stenosis. MRA NECK IMPRESSION: 1. 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 2. Wide patency of the left carotid artery system within the neck. 3. Widely patent vertebral arteries within the neck. Electronically Signed   By: Jeannine Boga M.D.   On: 06/25/2021 06:12   MR ANGIO NECK W WO CONTRAST  Result Date: 06/25/2021 CLINICAL DATA:  Initial evaluation for carotid stenosis screening. EXAM: MRA NECK WITHOUT AND WITH CONTRAST MRA HEAD WITHOUT CONTRAST  TECHNIQUE: Multiplanar and multiecho pulse sequences of the neck were obtained without and with intravenous contrast. Angiographic images of the neck were obtained using MRA technique without and with intravenous contrast; Angiographic images of the Circle of Willis were obtained using MRA technique without intravenous contrast. CONTRAST:  29mL GADAVIST GADOBUTROL 1 MMOL/ML IV SOLN COMPARISON:  Comparison made with prior brain MRI from 06/21/2021 FINDINGS: MRA NECK FINDINGS AORTIC ARCH: Visualized aortic arch normal caliber with normal branch pattern. No hemodynamically significant stenosis about the origin of the great vessels. RIGHT CAROTID SYSTEM: Right CCA patent from its origin to the bifurcation without stenosis. Atheromatous narrowing at the right carotid bulb/proximal right ICA with associated stenosis of up to 40-50% by NASCET criteria (series 1126, image 1). Right ICA otherwise patent distally without stenosis, evidence for dissection or occlusion. LEFT CAROTID SYSTEM: Left CCA patent from its origin to the bifurcation without stenosis. No significant atheromatous narrowing or irregularity about the left carotid bulb. Left ICA patent distally without stenosis, evidence for dissection or occlusion. VERTEBRAL ARTERIES: Both vertebral arteries arise from the subclavian arteries. Vertebral arteries are largely codominant. Vertebral arteries mildly tortuous but are widely patent without stenosis evidence for dissection or occlusion. MRA HEAD FINDINGS ANTERIOR CIRCULATION: Distal cervical segments of the internal carotid arteries are widely patent with antegrade flow. Petrous, cavernous, and supraclinoid segments widely patent without  stenosis or other abnormality. A1 segments patent bilaterally. Normal anterior communicating artery complex. Mild irregularity about the ACAs favored to be related to motion artifact. ACAs patent to their distal aspects without significant stenosis. No M1 stenosis or occlusion.  Normal MCA bifurcations. Distal MCA branches well perfused and symmetric. POSTERIOR CIRCULATION: Both vertebral arteries patent to the vertebrobasilar junction without stenosis. Both PICA origins patent and normal. Basilar patent to its distal aspect without stenosis. Superior cerebellar arteries patent bilaterally. Both PCAs supplied via hypoplastic P1 segments and robust bilateral posterior communicating arteries. PCAs well perfused to their distal aspects without stenosis. No intracranial aneurysm. IMPRESSION: MRA HEAD IMPRESSION: Negative intracranial MRA. No large vessel occlusion or hemodynamically significant stenosis. MRA NECK IMPRESSION: 1. 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 2. Wide patency of the left carotid artery system within the neck. 3. Widely patent vertebral arteries within the neck. Electronically Signed   By: Jeannine Boga M.D.   On: 06/25/2021 06:12   MR BRAIN WO CONTRAST  Result Date: 07/01/2021 CLINICAL DATA:  Mental status change, unknown cause EXAM: MRI HEAD WITHOUT CONTRAST TECHNIQUE: Multiplanar, multiecho pulse sequences of the brain and surrounding structures were obtained without intravenous contrast. COMPARISON:  06/21/2021. FINDINGS: Brain: Scattered foci of restricted diffusion with ADC correlate, largely cortical, in both cerebral hemispheres, most of which are punctate, with larger areas of infarction right parietal lobe (series 5, image 85) and left occipital lobe (series 5, image 68). The largest areas are also associated with increased T2 signal and may be subacute. The vast majority of these are new compared to 06/21/2021, with only 1 seen on the prior exam (right inferior parietal lobe lobe, posterior to the right lateral ventricle). The bilateral frontal lobes appear least affected. No foci of restricted diffusion in the cerebellum. No acute hemorrhage, mass, mass effect, or midline shift.  No hydrocephalus or extra-axial collection. Vascular: Normal flow voids. Skull and upper cervical spine: Normal marrow signal. Sinuses/Orbits: Mucosal thickening in the ethmoid air cells, maxillary sinuses, and sphenoid sinuses. The orbits are unremarkable. Other: Fluid throughout the bilateral mastoid air cells. 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 The 2 largest areas of infarction, in the right parietal and left occipital lobes, are associated with increased T2 signal and are likely subacute. Given multiple vascular territories, embolic etiology is suspected, although a hypotensive episode could also produce this appearance. Electronically Signed   By: Merilyn Baba M.D.   On: 07/01/2021 11:55   MR BRAIN W WO CONTRAST  Result Date: 06/21/2021 CLINICAL DATA:  60 year old female with unexplained altered mental status. Sepsis. EXAM: MRI HEAD WITHOUT AND WITH CONTRAST TECHNIQUE: Multiplanar, multiecho pulse sequences of the brain and surrounding structures were obtained without and with intravenous contrast. CONTRAST:  29mL GADAVIST GADOBUTROL 1 MMOL/ML IV SOLN COMPARISON:  Head CT 06/19/2021. FINDINGS: Brain: Scattered small cortical and occasionally white matter areas of restricted diffusion in both cerebral hemispheres. Many are punctate. Bilateral PCA, left MCA territories are affected. Minimal associated T2 and FLAIR hyperintensity. But no deep gray matter nuclei, brainstem or cerebellar involvement. Superimposed small chronic lacunar infarct of the left caudate nucleus. Mild patchy bilateral periatrial white matter T2 and FLAIR hyperintensity. No acute or chronic intracranial hemorrhage. No midline shift, mass effect, evidence of mass lesion, ventriculomegaly, extra-axial collection. Cervicomedullary junction and pituitary are within normal limits. No  abnormal  enhancement identified.  No dural thickening. Vascular: Major intracranial vascular flow voids are preserved. The major dural venous sinuses are enhancing and appear to be patent. Skull and upper cervical spine: Negative visible cervical spine. Visualized bone marrow signal is within normal limits. Sinuses/Orbits: Negative orbits. Trace paranasal sinus mucosal thickening. Other: Mastoids are clear. Visible internal auditory structures appear normal. Intubated. Small volume retained secretions in the nasopharynx. IMPRESSION: 1. Scattered punctate acute infarcts in both cerebral hemispheres (bilateral PCA and left MCA territories). No associated hemorrhage or mass effect. Although the posterior fossa and anterior cerebral artery territories are spared, the pattern is suspicious for a recent Embolic event. Hypotensive episode might also produce this appearance. 2. 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 Electronically Signed   By: Genevie Ann M.D.   On: 06/21/2021 05:31   US PELVIS (TRANSABDOMINAL ONLY)  Result Date: 07/03/2021 CLINICAL DATA:  Postmenopausal vaginal bleeding EXAM: TRANSABDOMINAL ULTRASOUND OF PELVIS TECHNIQUE: Transabdominal ultrasound examination of the pelvis was performed including evaluation of the uterus, ovaries, adnexal regions, and pelvic cul-de-sac. COMPARISON:  CT abdomen pelvis, 06/20/2021 FINDINGS: Uterus Measurements: 9.8 x 4.7 x 5.1 cm = volume: 124 mL. No fibroids or other mass visualized. Endometrium Thickness: 6 mm.  No focal abnormality visualized. Right ovary Not visualized. Left ovary The normal ovarian parenchyma is not clearly visualized. There is a cystic lesion of the left ovary or adnexa seen on prior examination dated 06/20/2021, measuring 5.3 cm. Other findings:  No abnormal free fluid. IMPRESSION: 1. Limited transabdominal only examination. Within this limitation, no ultrasound findings to explain  abnormal postmenopausal bleeding. 2. Nonvisualization of the right ovary. 3. Cystic lesion of the left ovary or adnexa seen on prior examination dated 06/20/2021, measuring 5.3 cm. Consider GYN consult and followup US in 3-6 months, or pelvis MRI w/o and w/ contrast for improved characterization. Note: This recommendation does not apply to premenarchal patients or to those with increased risk (genetic, family history, elevated tumor markers or other high-risk factors) of ovarian cancer. Reference: Radiology 2019 Nov; 293(2):359-371. Electronically Signed   By: Delanna Ahmadi M.D.   On: 07/03/2021 15:49   CT ABDOMEN PELVIS W CONTRAST  Result Date: 06/20/2021 CLINICAL DATA:  Sepsis.  Altered mental status. EXAM: CT ABDOMEN AND PELVIS WITH CONTRAST TECHNIQUE: Multidetector CT imaging of the abdomen and pelvis was performed using the standard protocol following bolus administration of intravenous contrast. CONTRAST:  162mL OMNIPAQUE IOHEXOL 300 MG/ML  SOLN COMPARISON:  None. FINDINGS: Lower Chest: Mild dependent atelectasis noted in both lung bases. Hepatobiliary: No hepatic masses identified. Mild diffuse hepatic steatosis noted. Cholelithiasis is noted, however there is no evidence of cholecystitis or biliary ductal dilatation. Pancreas:  No mass or inflammatory changes. Spleen: Within normal limits in size and appearance. Adrenals/Urinary Tract: Tiny right renal cyst noted. No masses identified. Several tiny punctate renal calculi are seen, left side greater than right, however there is no evidence of ureteral calculi or hydronephrosis. Foley catheter is seen within the bladder. Stomach/Bowel: Nasogastric tube is seen with tip in the gastric antrum. No evidence of obstruction, inflammatory process or abnormal fluid collections. Normal appendix visualized. Vascular/Lymphatic: No pathologically enlarged lymph nodes. No acute vascular findings. Aortic atherosclerotic calcification noted. Reproductive: Normal  appearance of uterus. A simple cyst is seen in the left adnexa which measures 5.4 x 4.4 cm. No other pelvic mass, inflammatory process, or abnormal fluid collections seen. Other:  None. Musculoskeletal:  No suspicious bone lesions identified. IMPRESSION: No evidence of inflammatory process or abscess. Cholelithiasis. No radiographic evidence of cholecystitis. Mild hepatic steatosis. Bilateral nephrolithiasis. No evidence of ureteral calculi or hydronephrosis. 5.4 cm benign-appearing left adnexal cyst. Recommend follow-up US in 6-12 months. Note: This recommendation does not apply to premenarchal patients and to those with increased risk (genetic, family history, elevated tumor markers or other high-risk factors) of ovarian cancer. Reference: JACR 2020 Feb; 17(2):248-254 Aortic Atherosclerosis (ICD10-I70.0). Electronically Signed   By: Danae Orleans M.D.   On: 06/20/2021 11:54   DG CHEST PORT 1 VIEW  Result Date: 07/18/2021 CLINICAL DATA:  Unexplained fever. EXAM: PORTABLE CHEST 1 VIEW COMPARISON:  Portable chest 06/30/2021. FINDINGS: Interval extubation and removal of NGT. Lung volumes are better than previously. No focal pneumonia or pleural effusion is seen. There is mild-to-moderate cardiomegaly with normal caliber central vasculature and calcifications again present in the aortic arch. Osteopenia and thoracic spondylosis. IMPRESSION: Cardiomegaly with no evidence of acute chest disease. Electronically Signed   By: Almira Bar M.D.   On: 07/18/2021 23:20   DG Chest Port 1 View  Result Date: 06/30/2021 CLINICAL DATA:  Check endotracheal tube placement EXAM: PORTABLE CHEST 1 VIEW COMPARISON:  06/20/2021 FINDINGS: Endotracheal tube is noted at the level of the carina directed towards right mainstem bronchus. This should be withdrawn approximately 2 cm. Gastric catheter extends into the stomach. Cardiac shadow is enlarged in size but stable. Mild central vascular congestion is noted. Some patchy airspace  opacity is noted in the left mid lung which may be related to edema. No bony abnormality is seen. Right jugular catheter is been removed. IMPRESSION: Tubes and lines as described above. The endotracheal tube should be withdrawn approximately 2 cm. Vascular congestion with mild airspace opacity on the left likely representing edema. Electronically Signed   By: Alcide Clever M.D.   On: 06/30/2021 15:43   DG Abd Portable 1V  Result Date: 07/02/2021 CLINICAL DATA:  Nasogastric tube placement. EXAM: PORTABLE ABDOMEN - 1 VIEW COMPARISON:  June 30, 2021 FINDINGS: A feeding tube is seen with its distal tip overlying the expected region of the gastric antrum. The bowel gas pattern is normal. No radio-opaque calculi or other significant radiographic abnormality are seen. IMPRESSION: Feeding tube positioning, as described above. Electronically Signed   By: Aram Candela M.D.   On: 07/02/2021 17:10   EEG adult  Result Date: 07/01/2021 Charlsie Quest, MD     07/01/2021  9:56 AM Patient Name: Lauren Velazquez MRN: [id number] Epilepsy Attending: Charlsie Quest Referring Physician/Provider: Dr. Caryl Pina Date: 07/01/2021 Duration: 22.04 mins Patient history: 60 year old female with altered mental status and seizure.  EEG to evaluate for seizure. Level of alertness:  comatose AEDs during EEG study: Keppra, Vimpat Technical aspects: This EEG study was done with scalp electrodes positioned according to the 10-20 International system of electrode placement. Electrical activity was acquired at a sampling rate of 500Hz  and reviewed with a high frequency filter of 70Hz  and a low frequency filter of 1Hz . EEG data were recorded continuously and digitally stored. Description: EEG showed continuous generalized polymorphic 3 to 6 Hz theta-delta slowing with overriding 15 to 18 Hz beta activity distributed symmetrically and diffusely. Hyperventilation and photic stimulation were not performed.   ABNORMALITY - Continuous  slow, generalized - Excessive beta, generalized IMPRESSION: This study is suggestive of moderate to severe diffuse encephalopathy, nonspecific etiology. No seizures or epileptiform discharges were seen throughout the recording. Dr was notified. Priyanka O  Yadav   EEG adult  Result Date: 06/21/2021 Derek Jack, MD     06/21/2021  7:34 PM Routine EEG Report Mendi Swapp is a 60 y.o. female with a history of encephalopathy who is undergoing an EEG to evaluate for seizures. Report: This EEG was acquired with electrodes placed according to the International 10-20 electrode system (including Fp1, Fp2, F3, F4, C3, C4, P3, P4, O1, O2, T3, T4, T5, T6, A1, A2, Fz, Cz, Pz). The following electrodes were missing or displaced: none. The best background was 6 Hz and was reactive to stimulation. No waking rhythm was identified. Some sleep architecture was identified. There were focal slowing over the right hemisphere. There were no interictal epileptiform discharges. There were no electrographic seizures identified. Photic stimulation and hyperventilation were not performed. Impression and clinical correlation: This EEG was obtained while comatose and is abnormal due to: - moderate diffuse slowing - focal right sided slowing There were no epileptiform abnormalities noted during this recording. Su Monks, MD [facility] [phone] If 7pm- 7am, please page neurology on call as listed in [place].   Overnight EEG with video  Result Date: 07/04/2021 Lora Havens, MD     07/05/2021 10:33 AM Patient Name: Lauren Velazquez MRN: [id number] Epilepsy Attending: Lora Havens Referring Physician/Provider: Dr. Kerney Elbe Duration: 07/04/2021 0147 to 07/05/2021 0147  Patient history: 60 year old female with altered mental status and seizure.  EEG to evaluate for seizure.  Level of alertness:  awake, asleep  AEDs during EEG study: Keppra, Vimpat  Technical aspects: This EEG study was done with scalp  electrodes positioned according to the 10-20 International system of electrode placement. Electrical activity was acquired at a sampling rate of 500Hz  and reviewed with a high frequency filter of 70Hz  and a low frequency filter of 1Hz . EEG data were recorded continuously and digitally stored.  Description: The posterior dominant rhythm consists of 8-9 Hz activity of moderate voltage (25-35 uV) seen predominantly in posterior head regions, symmetric and reactive to eye opening and eye closing. Sleep was characterized by vertex waves, sleep spindles (12 to 14 Hz), maximal frontocentral region. EEG showed intermittent generalized and maximal right temporal region 3 to 6 Hz theta-delta slowing. Hyperventilation and photic stimulation were not performed.    ABNORMALITY - Intermittent slow, generalized and maximal right temporal region  IMPRESSION: This study is suggestive of cortical dysfunction in right temporal region, non specific etiology as well as mild diffuse encephalopathy, nonspecific etiology. No seizures or definite epileptiform discharges were seen throughout the recording.  Priyanka Barbra Sarks   Overnight EEG with video  Result Date: 07/01/2021 Lora Havens, MD     07/02/2021  6:09 AM Patient Name: Lauren Velazquez MRN: [id number] Epilepsy Attending: Lora Havens Referring Physician/Provider: Dr. Kerney Elbe Duration: 07/01/2021 0042 to 07/02/2021 0042  Patient history: 60 year old female with altered mental status and seizure.  EEG to evaluate for seizure.  Level of alertness:  comatose  AEDs during EEG study: Keppra, Vimpat, versed Technical aspects: This EEG study was done with scalp electrodes positioned according to the 10-20 International system of electrode placement. Electrical activity was acquired at a sampling rate of 500Hz  and reviewed with a high frequency filter of 70Hz  and a low frequency filter of 1Hz . EEG data were recorded continuously and digitally stored.  Description: EEG showed  continuous generalized polymorphic 3 to 6 Hz theta-delta slowing with overriding 15 to 18 Hz beta activity distributed symmetrically and diffusely. Hyperventilation and photic stimulation were not performed.  ABNORMALITY - Continuous slow, generalized - Excessive beta, generalized  IMPRESSION: This study is suggestive of moderate to severe diffuse encephalopathy, nonspecific etiology. No seizures or epileptiform discharges were seen throughout the recording.  Priyanka O Yadav   VAS [place] TRANSCRANIAL DOPPLER W BUBBLES  Result Date: 07/07/2021  Transcranial Doppler with Bubble Patient Name:  Lauren Velazquez  Date of Exam:   07/07/2021 Medical Rec #: [id number]       Accession #:    [id number] Date of Birth: 1961/06/25       Patient Gender: F Patient Age:   66 years Exam Location:  [facility] Procedure:      VAS [place] TRANSCRANIAL DOPPLER W BUBBLES Referring Phys: PRAMOD SETHI --------------------------------------------------------------------------------  Indications: Stroke. Comparison Study: no prior Performing Technologist: Archie Patten RVS  Examination Guidelines: A complete evaluation includes B-mode imaging, spectral Doppler, color Doppler, and power Doppler as needed of all accessible portions of each vessel. Bilateral testing is considered an integral part of a complete examination. Limited examinations for reoccurring indications may be performed as noted.  Summary: No HITS at rest or during Valsalva. Negative transcranial Doppler Bubble study with no evidence of right to left intracardiac communication.  A vascular evaluation was performed. The right middle cerebral artery was studied. An IV was inserted into the patient's left forearm . Verbal informed consent was obtained.  Negative TCD Bubble study with no evidence of right to left shunt *See table(s) above for TCD measurements and observations.  Diagnosing physician: Antony Contras MD Electronically signed by Antony Contras MD on 07/07/2021 at  2:06:49 PM.    Final    ECHOCARDIOGRAM COMPLETE  Result Date: 06/21/2021    ECHOCARDIOGRAM REPORT   Patient Name:   Lauren Velazquez Date of Exam: 06/21/2021 Medical Rec #:  [id number]      Height:       61.0 in Accession #:    [id number]     Weight:       227.7 lb Date of Birth:  [birth year]      BSA:          1.996 m Patient Age:    81 years       BP:           105/55 mmHg Patient Gender: F              HR:           74 bpm. Exam Location:  [facility] Procedure: 2D Echo, Color Doppler, Cardiac Doppler and Intracardiac            Opacification Agent Indications:     I42.9 Cardiomyopathy-unspecified  History:         Patient has no prior history of Echocardiogram examinations.                  Risk Factors:Hypertension and Diabetes.  Sonographer:     Charmayne Sheer Referring Phys:  [id number] BRITTON L RUST-CHESTER Diagnosing Phys: Ida Rogue MD  Sonographer Comments: Suboptimal apical window and suboptimal subcostal window. IMPRESSIONS  1. Left ventricular ejection fraction, by estimation, is 60 to 65%. The left ventricle has normal function. The left ventricle has no regional wall motion abnormalities. Left ventricular diastolic parameters are consistent with Grade I diastolic dysfunction (impaired relaxation).  2. Right ventricular systolic function is normal. The right ventricular size is normal.  3. The mitral valve is normal in structure. No evidence of mitral valve regurgitation. No evidence of mitral stenosis.  4. The aortic valve is  normal in structure. Aortic valve regurgitation is not visualized. No aortic stenosis is present.  5. The inferior vena cava is normal in size with greater than 50% respiratory variability, suggesting right atrial pressure of 3 mmHg. FINDINGS  Left Ventricle: Left ventricular ejection fraction, by estimation, is 60 to 65%. The left ventricle has normal function. The left ventricle has no regional wall motion abnormalities. Definity contrast agent was given IV to delineate the left  ventricular  endocardial borders. The left ventricular internal cavity size was normal in size. There is no left ventricular hypertrophy. Left ventricular diastolic parameters are consistent with Grade I diastolic dysfunction (impaired relaxation). Right Ventricle: The right ventricular size is normal. No increase in right ventricular wall thickness. Right ventricular systolic function is normal. Left Atrium: Left atrial size was normal in size. Right Atrium: Right atrial size was normal in size. Pericardium: There is no evidence of pericardial effusion. Mitral Valve: The mitral valve is normal in structure. No evidence of mitral valve regurgitation. No evidence of mitral valve stenosis. MV peak gradient, 4.2 mmHg. The mean mitral valve gradient is 2.0 mmHg. Tricuspid Valve: The tricuspid valve is normal in structure. Tricuspid valve regurgitation is not demonstrated. No evidence of tricuspid stenosis. Aortic Valve: The aortic valve is normal in structure. Aortic valve regurgitation is not visualized. No aortic stenosis is present. Aortic valve mean gradient measures 6.0 mmHg. Aortic valve peak gradient measures 11.2 mmHg. Aortic valve area, by VTI measures 1.91 cm. Pulmonic Valve: The pulmonic valve was normal in structure. Pulmonic valve regurgitation is not visualized. No evidence of pulmonic stenosis. Aorta: The aortic root is normal in size and structure. Venous: The inferior vena cava is normal in size with greater than 50% respiratory variability, suggesting right atrial pressure of 3 mmHg. IAS/Shunts: No atrial level shunt detected by color flow Doppler.  LEFT VENTRICLE PLAX 2D LVIDd:         4.40 cm   Diastology LVIDs:         2.40 cm   LV e' medial:    4.90 cm/s LV PW:         1.30 cm   LV E/e' medial:  14.8 LV IVS:        1.00 cm   LV e' lateral:   5.87 cm/s LVOT diam:     1.80 cm   LV E/e' lateral: 12.3 LV SV:         52 LV SV Index:   26 LVOT Area:     2.54 cm  LEFT ATRIUM         Index LA diam:     3.30 cm 1.65 cm/m  AORTIC VALVE                     PULMONIC VALVE AV Area (Vmax):    1.83 cm      PV Vmax:       1.18 m/s AV Area (Vmean):   1.74 cm      PV Vmean:      79.700 cm/s AV Area (VTI):     1.91 cm      PV VTI:        0.218 m AV Vmax:           167.00 cm/s   PV Peak grad:  5.6 mmHg AV Vmean:          118.000 cm/s  PV Mean grad:  3.0 mmHg AV VTI:  0.274 m AV Peak Grad:      11.2 mmHg AV Mean Grad:      6.0 mmHg LVOT Vmax:         120.00 cm/s LVOT Vmean:        80.600 cm/s LVOT VTI:          0.206 m LVOT/AV VTI ratio: 0.75  AORTA Ao Root diam: 2.90 cm MITRAL VALVE MV Area (PHT): 3.23 cm    SHUNTS MV Area VTI:   2.06 cm    Systemic VTI:  0.21 m MV Peak grad:  4.2 mmHg    Systemic Diam: 1.80 cm MV Mean grad:  2.0 mmHg MV Vmax:       1.02 m/s MV Vmean:      61.3 cm/s MV Decel Time: 235 msec MV E velocity: 72.40 cm/s MV A velocity: 88.30 cm/s MV E/A ratio:  0.82 Ida Rogue MD Electronically signed by Ida Rogue MD Signature Date/Time: 06/21/2021/1:25:55 PM    Final    ECHO TEE  Result Date: 06/25/2021    TRANSESOPHOGEAL ECHO REPORT   Patient Name:   Lauren Velazquez Date of Exam: 06/25/2021 Medical Rec #:  [id number]      Height:       61.0 in Accession #:    [id number]     Weight:       238.8 lb Date of Birth:  10-23-1960      BSA:          2.036 m Patient Age:    25 years       BP:           109/59 mmHg Patient Gender: F              HR:           91 bpm. Exam Location:  [facility] Procedure: Transesophageal Echo, Cardiac Doppler, Color Doppler and [place]            Contrast Bubble Study Indications:     Cerebral infarction -unspecified I63.9  History:         Patient has prior history of Echocardiogram examinations, most                  recent 06/21/2021. Risk Factors:Hypertension and Diabetes.  Sonographer:     Sherrie Sport Referring Phys:  [id number] CHRISTOPHER RONALD BERGE Diagnosing Phys: Ida Rogue MD PROCEDURE: After discussion of the risks and benefits of a TEE, an informed consent was  obtained from a family member. The patient was intubated. The transesophogeal probe was passed without difficulty through the esophogus of the patient. Local oropharyngeal anesthetic was provided with Cetacaine and viscous lidocaine. Sedation performed by performing physician. Image quality was excellent. The patient's vital signs; including heart rate, blood pressure, and oxygen saturation; remained stable throughout the procedure. The patient developed no complications during the procedure. IMPRESSIONS  1. Left ventricular ejection fraction, by estimation, is 60 to 65%. The left ventricle has normal function. The left ventricle has no regional wall motion abnormalities.  2. Right ventricular systolic function is normal. The right ventricular size is normal.  3. No left atrial/left atrial appendage thrombus was detected.  4. The mitral valve is normal in structure. Mild to moderate mitral valve regurgitation. No evidence of mitral stenosis.  5. The aortic valve is normal in structure. Aortic valve regurgitation is not visualized. No aortic stenosis is present.  6. There is Moderate (Grade III) atheroma plaque involving the aortic arch.  7. Agitated saline  contrast bubble study was negative, with no evidence of any interatrial shunt. Conclusion(s)/Recommendation(s): Normal biventricular function without evidence of hemodynamically significant valvular heart disease. FINDINGS  Left Ventricle: Left ventricular ejection fraction, by estimation, is 60 to 65%. The left ventricle has normal function. The left ventricle has no regional wall motion abnormalities. The left ventricular internal cavity size was normal in size. There is  borderline left ventricular hypertrophy. Right Ventricle: The right ventricular size is normal. No increase in right ventricular wall thickness. Right ventricular systolic function is normal. Left Atrium: Left atrial size was normal in size. No left atrial/left atrial appendage thrombus was  detected. Right Atrium: Right atrial size was normal in size. Pericardium: There is no evidence of pericardial effusion. Mitral Valve: The mitral valve is normal in structure. Mild to moderate mitral valve regurgitation. No evidence of mitral valve stenosis. Tricuspid Valve: The tricuspid valve is normal in structure. Tricuspid valve regurgitation is not demonstrated. No evidence of tricuspid stenosis. Aortic Valve: The aortic valve is normal in structure. Aortic valve regurgitation is not visualized. No aortic stenosis is present. Pulmonic Valve: The pulmonic valve was normal in structure. Pulmonic valve regurgitation is not visualized. No evidence of pulmonic stenosis. Aorta: The aortic root is normal in size and structure. There is moderate (Grade III) atheroma plaque involving the aortic arch. Venous: The inferior vena cava is normal in size with greater than 50% respiratory variability, suggesting right atrial pressure of 3 mmHg. IAS/Shunts: No atrial level shunt detected by color flow Doppler. Agitated saline contrast was given intravenously to evaluate for intracardiac shunting. Agitated saline contrast bubble study was negative, with no evidence of any interatrial shunt. There  is no evidence of a patent foramen ovale. Ida Rogue MD Electronically signed by Ida Rogue MD Signature Date/Time: 06/25/2021/2:49:52 PM    Final    VAS [place] LOWER EXTREMITY VENOUS (DVT)  Result Date: 07/08/2021  Lower Venous DVT Study Patient Name:  Lauren Velazquez  Date of Exam:   07/07/2021 Medical Rec #: [id number]       Accession #:    [id number] Date of Birth: Dec 22, 1960       Patient Gender: F Patient Age:   69 years Exam Location:  [facility] Procedure:      VAS [place] LOWER EXTREMITY VENOUS (DVT) Referring Phys: [place] --------------------------------------------------------------------------------  Indications: F/u ptv dvt 07/02/21.  Comparison Study: 07/02/21 prior Performing Technologist: Archie Patten  RVS  Examination Guidelines: A complete evaluation includes B-mode imaging, spectral Doppler, color Doppler, and power Doppler as needed of all accessible portions of each vessel. Bilateral testing is considered an integral part of a complete examination. Limited examinations for reoccurring indications may be performed as noted. The reflux portion of the exam is performed with the patient in reverse Trendelenburg.  +-----+---------------+---------+-----------+----------+--------------+ RIGHTCompressibilityPhasicitySpontaneityPropertiesThrombus Aging +-----+---------------+---------+-----------+----------+--------------+ CFV  Full           Yes      Yes                                 +-----+---------------+---------+-----------+----------+--------------+   +---------+---------------+---------+-----------+----------+-----------------+ LEFT     CompressibilityPhasicitySpontaneityPropertiesThrombus Aging    +---------+---------------+---------+-----------+----------+-----------------+ CFV      Full           Yes      Yes                                    +---------+---------------+---------+-----------+----------+-----------------+  SFJ      Full                                                           +---------+---------------+---------+-----------+----------+-----------------+ FV Prox  Full                                                           +---------+---------------+---------+-----------+----------+-----------------+ FV Mid   Full                                                           +---------+---------------+---------+-----------+----------+-----------------+ FV DistalFull                                                           +---------+---------------+---------+-----------+----------+-----------------+ PFV      Full                                                            +---------+---------------+---------+-----------+----------+-----------------+ POP      Full           Yes      Yes                                    +---------+---------------+---------+-----------+----------+-----------------+ PTV      None                                         Chronic           +---------+---------------+---------+-----------+----------+-----------------+ PERO                                                  Age Indeterminate +---------+---------------+---------+-----------+----------+-----------------+     Summary: RIGHT: - No evidence of common femoral vein obstruction.  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 - Findings appear essentially unchanged compared to previous examination. - No cystic structure found in the popliteal fossa.  *See table(s) above for measurements and observations. Electronically signed by Coral Else MD on 07/08/2021 at 12:51:38 AM.    Final    VAS [place] LOWER EXTREMITY VENOUS (DVT)  Result Date: 07/03/2021  Lower Venous DVT Study Patient Name:  DAWANDA STEINRUCK  Date of Exam:   07/02/2021 Medical Rec #: [id number]  Accession #:    [id number] Date of Birth: [birth year]       Patient Gender: F Patient Age:   59 years Exam Location:  [facility] Procedure:      VAS [place] LOWER EXTREMITY VENOUS (DVT) Referring Phys: Cornelius Moras XU --------------------------------------------------------------------------------  Indications: Stroke.  Limitations: Poor ultrasound/tissue interface and Very uncooperative, constant movement, kicking sonographer. Comparison Study: No prior study Performing Technologist: Maudry Mayhew MHA, RDMS, RVT, RDCS  Examination Guidelines: A complete evaluation includes B-mode imaging, spectral Doppler, color Doppler, and power Doppler as needed of all accessible portions of each vessel. Bilateral testing is considered an integral part of a complete  examination. Limited examinations for reoccurring indications may be performed as noted. The reflux portion of the exam is performed with the patient in reverse Trendelenburg.  +---------+---------------+---------+-----------+----------+--------------+ RIGHT    CompressibilityPhasicitySpontaneityPropertiesThrombus Aging +---------+---------------+---------+-----------+----------+--------------+ CFV      Full           Yes      Yes                                 +---------+---------------+---------+-----------+----------+--------------+ FV Prox  Full                                                        +---------+---------------+---------+-----------+----------+--------------+ FV Mid   Full                                                        +---------+---------------+---------+-----------+----------+--------------+ FV DistalFull                                                        +---------+---------------+---------+-----------+----------+--------------+ PFV      Full                                                        +---------+---------------+---------+-----------+----------+--------------+ POP      Full           Yes      Yes                                 +---------+---------------+---------+-----------+----------+--------------+   Right Technical Findings: Not visualized segments include SFJ, PTV, peroneal veins.  +---------+---------------+---------+-----------+----------+-------------------+ LEFT     CompressibilityPhasicitySpontaneityPropertiesThrombus Aging      +---------+---------------+---------+-----------+----------+-------------------+ CFV      Full           Yes      Yes                                      +---------+---------------+---------+-----------+----------+-------------------+ SFJ      Full                                                              +---------+---------------+---------+-----------+----------+-------------------+  FV Prox  Full                                                             +---------+---------------+---------+-----------+----------+-------------------+ FV Mid   Full                                                             +---------+---------------+---------+-----------+----------+-------------------+ FV DistalFull                                                             +---------+---------------+---------+-----------+----------+-------------------+ PTV      None                    Yes                  Age indeterminate,                                                        non-occlusive       +---------+---------------+---------+-----------+----------+-------------------+   Left Technical Findings: Not visualized segments include PFV, popliteal, peroneal veins.   Summary: RIGHT: - There is no evidence of deep vein thrombosis in the lower extremity. However, portions of this examination were limited- see technologist comments above.  - No cystic structure found in the popliteal fossa.  LEFT: - Findings consistent with age indeterminate deep vein thrombosis involving the left posterior tibial veins.  *See table(s) above for measurements and observations. Electronically signed by Jamelle Haring on 07/03/2021 at 11:12:58 AM.    Final    [place] EKG SITE RITE  Result Date: 06/21/2021 If Site Rite image not attached, placement could not be confirmed due to current cardiac rhythm.   DISCHARGE EXAMINATION: Vitals:   07/20/21 0230 07/20/21 0513 07/20/21 0758 07/20/21 0839  BP: 117/60  (!) 114/48   Pulse: 89  83   Resp: 18  18   Temp: 98.4 F (36.9 C)  98.5 F (36.9 C)   TempSrc: Oral  Oral   SpO2: 99%  96% 93%  Weight:  99.1 kg    Height:       General appearance: Awake alert.  In no distress Resp: Clear to auscultation bilaterally.  Normal effort Cardio: S1-S2 is normal regular.   No S3-S4.  No rubs murmurs or bruit GI: Abdomen is soft.  Nontender nondistended.  Bowel sounds are present normal.  No masses organomegaly Some restricted range of motion noted in the right lower extremity but she is still able to bend her knee without any problem.  No swelling noted around the knee joint.   DISPOSITION: SNF  Discharge Instructions     Anticogulant already ordered   Complete by: As directed    Call MD for:  difficulty  breathing, headache or visual disturbances   Complete by: As directed    Call MD for:  extreme fatigue   Complete by: As directed    Call MD for:  persistant dizziness or light-headedness   Complete by: As directed    Call MD for:  persistant nausea and vomiting   Complete by: As directed    Call MD for:  severe uncontrolled pain   Complete by: As directed    Call MD for:  temperature >100.4   Complete by: As directed    Diet - low sodium heart healthy   Complete by: As directed    Discharge instructions   Complete by: As directed    Please review instructions on the discharge summary.  You were cared for by a hospitalist during your hospital stay. If you have any questions about your discharge medications or the care you received while you were in the hospital after you are discharged, you can call the unit and asked to speak with the hospitalist on call if the hospitalist that took care of you is not available. Once you are discharged, your primary care physician will handle any further medical issues. Please note that NO REFILLS for any discharge medications will be authorized once you are discharged, as it is imperative that you return to your primary care physician (or establish a relationship with a primary care physician if you do not have one) for your aftercare needs so that they can reassess your need for medications and monitor your lab values. If you do not have a primary care physician, you can call [phone] for a physician referral.    Increase activity slowly   Complete by: As directed    No wound care   Complete by: As directed           Allergies as of 07/20/2021       Reactions   Atorvastatin Swelling        Medication List     STOP taking these medications    gabapentin 300 MG capsule Commonly known as: NEURONTIN Replaced by: gabapentin 600 MG tablet   insulin glargine 100 UNIT/ML Solostar Pen Commonly known as: LANTUS   NovoLOG FlexPen 100 UNIT/ML FlexPen Generic drug: insulin aspart Replaced by: insulin aspart 100 UNIT/ML injection   traMADol 50 MG tablet Commonly known as: ULTRAM       TAKE these medications    albuterol 108 (90 Base) MCG/ACT inhaler Commonly known as: VENTOLIN HFA Inhale 2 puffs into the lungs every 6 (six) hours as needed.   amLODipine 5 MG tablet Commonly known as: NORVASC Take 1 tablet (5 mg total) by mouth daily.   bisacodyl 10 MG suppository Commonly known as: DULCOLAX Place 1 suppository (10 mg total) rectally daily as needed for moderate constipation.   cefpodoxime 200 MG tablet Commonly known as: VANTIN Take 1 tablet (200 mg total) by mouth 2 (two) times daily for 6 days.   ezetimibe 10 MG tablet Commonly known as: ZETIA Take 1 tablet (10 mg total) by mouth daily.   ferrous sulfate 325 (65 FE) MG tablet Take 1 tablet (325 mg total) by mouth 2 (two) times daily with a meal.   folic acid 1 MG tablet Commonly known as: FOLVITE Take 1 tablet (1 mg total) by mouth daily.   gabapentin 600 MG tablet Commonly known as: NEURONTIN Take 1 tablet (600 mg total) by mouth 3 (three) times daily. Replaces: gabapentin 300 MG capsule   insulin aspart 100  UNIT/ML injection Commonly known as: novoLOG Inject 12 Units into the skin 3 (three) times daily with meals. Replaces: NovoLOG FlexPen 100 UNIT/ML FlexPen   insulin aspart 100 UNIT/ML injection Commonly known as: novoLOG Use as per following sliding scale: CBG 70 - 120: 0 units CBG 121 - 150: 3 units  CBG 151 - 200: 4 units CBG 201 - 250: 7 units CBG 251 - 300: 11 units CBG 301 - 350: 15 units CBG 351 - 400: 20 units and call MD/APP for further instructions   insulin glargine-yfgn 100 UNIT/ML injection Commonly known as: SEMGLEE Inject 80 Units into the skin 3 (three) times daily.   Lacosamide 100 MG Tabs Take 1 tablet (100 mg total) by mouth 2 (two) times daily.   levETIRAcetam 1000 MG tablet Commonly known as: KEPPRA Take 1 tablet (1,000 mg total) by mouth 2 (two) times daily.   lisinopril 10 MG tablet Commonly known as: ZESTRIL Take 1 tablet (10 mg total) by mouth daily.   megestrol 40 MG tablet Commonly known as: MEGACE Take 1 tablet (40 mg total) by mouth 2 (two) times daily.   Muscle Rub 10-15 % Crea Apply 1 application topically as needed for muscle pain.   oxyCODONE-acetaminophen 10-325 MG tablet Commonly known as: PERCOCET Take 1 tablet by mouth every 5 (five) hours as needed for pain.   polyethylene glycol 17 g packet Commonly known as: MIRALAX / GLYCOLAX Take 17 g by mouth 2 (two) times daily.   QUEtiapine 50 MG tablet Commonly known as: SEROQUEL Take 1 tablet (50 mg total) by mouth 2 (two) times daily.   Rivaroxaban 15 MG Tabs tablet Commonly known as: XARELTO Take 1 tablet (15 mg total) by mouth 2 (two) times daily with a meal for 7 days.   rivaroxaban 20 MG Tabs tablet Commonly known as: XARELTO Take 1 tablet (20 mg total) by mouth daily with supper. STARTING 07/28/21 Start taking on: July 28, 2021   senna-docusate 8.6-50 MG tablet Commonly known as: Senokot-S Take 2 tablets by mouth 2 (two) times daily.   Trelegy Ellipta 100-62.5-25 MCG/ACT Aepb Generic drug: Fluticasone-Umeclidin-Vilant Inhale 1 puff into the lungs daily.   vitamin B-12 500 MCG tablet Commonly known as: CYANOCOBALAMIN Take 1 tablet (500 mcg total) by mouth daily.          Follow-up Information     Denton Lank, MD Follow up in 1 week(s).   Specialty: Family  Medicine Contact information: [address] [address] [phone]                 TOTAL DISCHARGE TIME: 35 mins  [facility] Pager on [email or web address]  07/20/2021, 9:18 AM

## 2021-07-20 NOTE — Progress Notes (Signed)
Report given to Rowena at [facility] in [place], [place] reflecting patient's current statuse.

## 2021-07-21 LAB — URINE CULTURE: Culture: 70000 — AB

## 2021-07-23 ENCOUNTER — Telehealth: Payer: Self-pay | Admitting: Family Medicine

## 2021-07-23 NOTE — Telephone Encounter (Signed)
Called patient on all numbers listed to be contacted at and received no answer to any of the calls, a voicemail was left on all of the calls with a call back number for the office.

## 2021-07-27 ENCOUNTER — Encounter: Payer: Self-pay | Admitting: Student

## 2021-08-10 ENCOUNTER — Encounter: Payer: BLUE CROSS/BLUE SHIELD | Admitting: Family Medicine

## 2021-08-31 ENCOUNTER — Other Ambulatory Visit: Payer: Self-pay | Admitting: Physician Assistant

## 2021-08-31 DIAGNOSIS — M79604 Pain in right leg: Secondary | ICD-10-CM

## 2021-08-31 DIAGNOSIS — M7989 Other specified soft tissue disorders: Secondary | ICD-10-CM

## 2021-09-15 ENCOUNTER — Ambulatory Visit
Admission: RE | Admit: 2021-09-15 | Discharge: 2021-09-15 | Disposition: A | Discharge status: Home / Self Care | Payer: 59 | Source: Ambulatory Visit | Attending: Physician Assistant | Admitting: Physician Assistant

## 2021-09-15 ENCOUNTER — Other Ambulatory Visit: Payer: Self-pay

## 2021-09-15 DIAGNOSIS — M79604 Pain in right leg: Secondary | ICD-10-CM | POA: Insufficient documentation

## 2021-09-15 DIAGNOSIS — M7989 Other specified soft tissue disorders: Secondary | ICD-10-CM | POA: Insufficient documentation

## 2021-10-12 ENCOUNTER — Encounter (INDEPENDENT_AMBULATORY_CARE_PROVIDER_SITE_OTHER): Payer: BLUE CROSS/BLUE SHIELD | Admitting: Vascular Surgery

## 2022-01-21 ENCOUNTER — Other Ambulatory Visit: Payer: Self-pay | Admitting: Obstetrics and Gynecology

## 2022-01-21 ENCOUNTER — Ambulatory Visit: Payer: Self-pay | Admitting: Obstetrics and Gynecology

## 2022-01-21 DIAGNOSIS — D500 Iron deficiency anemia secondary to blood loss (chronic): Secondary | ICD-10-CM

## 2022-01-25 ENCOUNTER — Other Ambulatory Visit: Payer: Self-pay | Admitting: Obstetrics and Gynecology

## 2022-01-25 NOTE — Progress Notes (Signed)
Iron infusion , outpt x 3 weeks

## 2022-02-01 ENCOUNTER — Ambulatory Visit: Admission: RE | Admit: 2022-02-01 | Payer: 59 | Source: Ambulatory Visit

## 2022-02-04 DIAGNOSIS — I639 Cerebral infarction, unspecified: Secondary | ICD-10-CM

## 2022-02-09 ENCOUNTER — Encounter: Payer: Self-pay | Admitting: Emergency Medicine

## 2022-02-09 ENCOUNTER — Emergency Department: Payer: 59

## 2022-02-09 ENCOUNTER — Emergency Department
Admission: EM | Admit: 2022-02-09 | Discharge: 2022-02-09 | Disposition: A | Discharge status: Home / Self Care | Payer: 59 | Attending: Emergency Medicine | Admitting: Emergency Medicine

## 2022-02-09 ENCOUNTER — Other Ambulatory Visit: Payer: Self-pay

## 2022-02-09 DIAGNOSIS — J45909 Unspecified asthma, uncomplicated: Secondary | ICD-10-CM | POA: Diagnosis not present

## 2022-02-09 DIAGNOSIS — K5900 Constipation, unspecified: Secondary | ICD-10-CM | POA: Diagnosis present

## 2022-02-09 DIAGNOSIS — Z794 Long term (current) use of insulin: Secondary | ICD-10-CM | POA: Diagnosis not present

## 2022-02-09 DIAGNOSIS — I10 Essential (primary) hypertension: Secondary | ICD-10-CM | POA: Diagnosis not present

## 2022-02-09 DIAGNOSIS — Z7901 Long term (current) use of anticoagulants: Secondary | ICD-10-CM | POA: Diagnosis not present

## 2022-02-09 DIAGNOSIS — Z7951 Long term (current) use of inhaled steroids: Secondary | ICD-10-CM | POA: Insufficient documentation

## 2022-02-09 DIAGNOSIS — T402X5A Adverse effect of other opioids, initial encounter: Secondary | ICD-10-CM | POA: Insufficient documentation

## 2022-02-09 DIAGNOSIS — E119 Type 2 diabetes mellitus without complications: Secondary | ICD-10-CM | POA: Diagnosis not present

## 2022-02-09 DIAGNOSIS — Z79899 Other long term (current) drug therapy: Secondary | ICD-10-CM | POA: Insufficient documentation

## 2022-02-09 DIAGNOSIS — K5909 Other constipation: Secondary | ICD-10-CM | POA: Diagnosis not present

## 2022-02-09 MED ORDER — DOCUSATE SODIUM 50 MG/5ML PO LIQD
100.0000 mg | Freq: Once | ORAL | Status: AC
  Administered 2022-02-09: 100 mg via ORAL
  Filled 2022-02-09: qty 10

## 2022-02-09 MED ORDER — LACTULOSE 10 GM/15ML PO SOLN: 20.0000 g | Freq: Every day | ORAL | 0 refills | Status: DC | PRN

## 2022-02-09 MED ORDER — LACTULOSE 10 GM/15ML PO SOLN
30.0000 g | Freq: Once | ORAL | Status: AC
  Administered 2022-02-09: 30 g via ORAL
  Filled 2022-02-09: qty 60

## 2022-02-09 MED ORDER — LIDOCAINE HCL URETHRAL/MUCOSAL 2 % EX GEL
1.0000 | Freq: Once | CUTANEOUS | Status: DC
  Filled 2022-02-09: qty 10

## 2022-02-09 MED ORDER — LACTULOSE 10 GM/15ML PO SOLN
30.0000 g | Freq: Once | ORAL | Status: AC
Start: 2022-02-09 — End: 2022-02-09
  Administered 2022-02-09: 30 g
  Filled 2022-02-09: qty 60

## 2022-02-09 NOTE — ED Triage Notes (Signed)
Patient ambulatory to triage with steady gait, without difficulty or distress noted; pt reports that she "takes a lot of pain medication" and has been constipated with last BM 2-3 days ago, unrelieved by miralax

## 2022-02-09 NOTE — Discharge Instructions (Signed)
1.  Take Lactulose as needed for bowel movements. 2.  I recommend the following to regulate your bowel movements: MiraLAX daily Stool softener such as Colace twice daily Increase fiber intake Increase hydration daily 3.  Return to the ER for worsening symptoms, persistent vomiting, difficulty breathing or other concerns.

## 2022-02-09 NOTE — ED Provider Notes (Signed)
[facility] Provider Note    Event Date/Time   First MD Initiated Contact with Patient 02/09/22 [phone]     (approximate)   History   Constipation   HPI  Lauren Velazquez is a 61 y.o. female who presents to the ED from home with a chief complaint of constipation.  Patient is on chronic opioid therapy through the pain clinic.  Usually has a bowel movement every 2 days.  Last bowel movement approximately 3 days ago, hard with small pellets and having to strain.  Denies fever, cough, chest pain, shortness of breath, abdominal pain, nausea, vomiting or urinary distention.  Tried MiraLAX without relief of symptoms.  Attempted fleets enema but had difficulty with the insertion.     Past Medical History   Past Medical History:  Diagnosis Date   Anemia    not currently under treatment   Anxiety    Asthma    during allergy season   Diabetes mellitus without complication (HCC)    Hypertension    Vertigo      Active Problem List   Patient Active Problem List   Diagnosis Date Noted   Acute metabolic encephalopathy 07/14/2021   DVT (deep venous thrombosis) (HCC) 07/14/2021   Vaginal bleeding, abnormal 07/14/2021   Restrictive lung disease 07/14/2021   OSA (obstructive sleep apnea) 07/14/2021   Cerebrovascular accident (CVA) (HCC)    Seizure (HCC) 06/30/2021   Respiratory insufficiency    Encephalopathy acute    Acute delirium 06/21/2021   Altered mental status 06/20/2021   Diabetes mellitus (HCC) 06/20/2021   Acute sepsis (HCC)    Pneumonia due to COVID-19 virus 07/15/2020   Acute respiratory failure due to COVID-19 (HCC) 07/15/2020   Obesity, Class III, BMI 40-49.9 (morbid obesity) (HCC) 07/15/2020   Chronic prescription opiate use 07/15/2020   Bacterial pneumonia 07/15/2020   Acute hypoxemic respiratory failure due to COVID-19 (HCC) 07/15/2020   Suspected COVID-19 virus infection 03/06/2020   Elevated troponin 03/06/2020   Acute hypoxemic  respiratory failure (HCC) 03/05/2020   Diabetes mellitus type 2, uncomplicated (HCC) 05/30/2014   Hyperlipidemia 05/30/2014   Hypertension 05/30/2014   Obesity 05/30/2014   Vertigo 05/30/2014   Carpal tunnel syndrome on both sides 12/27/2013     Past Surgical History   Past Surgical History:  Procedure Laterality Date   CARPAL TUNNEL RELEASE Right 2015   KNEE ARTHROSCOPY Left 05/17/2018   Procedure: ARTHROSCOPY KNEE WITH LATERAL RELEASE, PARTIAL SYNOVECTOMY;  Surgeon: Kennedy Bucker, MD;  Location: [facility];  Service: Orthopedics;  Laterality: Left;   KNEE ARTHROSCOPY WITH LATERAL RELEASE Right 12/28/2017   Procedure: KNEE ARTHROSCOPY WITH LATERAL RELEASE;  Surgeon: Kennedy Bucker, MD;  Location: [facility];  Service: Orthopedics;  Laterality: Right;   TIBIAL TUBERCLERPLASTY Bilateral 06/01/2017   Procedure: TIBIAL TUBERCLE SPUR EXCISION;  Surgeon: Kennedy Bucker, MD;  Location: [facility];  Service: Orthopedics;  Laterality: Bilateral;     Home Medications   Prior to Admission medications   Medication Sig Start Date End Date Taking? Authorizing Provider  lactulose (CHRONULAC) 10 GM/15ML solution Take 30 mLs (20 g total) by mouth daily as needed for mild constipation. 02/09/22  Yes Irean Hong, MD  albuterol (VENTOLIN HFA) 108 (90 Base) MCG/ACT inhaler Inhale 2 puffs into the lungs every 6 (six) hours as needed. 05/08/19   [provider]  amLODipine (NORVASC) 5 MG tablet Take 1 tablet (5 mg total) by mouth daily. 07/20/21   Osvaldo Shipper, MD  bisacodyl (DULCOLAX) 10 MG  suppository Place 1 suppository (10 mg total) rectally daily as needed for moderate constipation. 07/20/21   Osvaldo Shipper, MD  ezetimibe (ZETIA) 10 MG tablet Take 1 tablet (10 mg total) by mouth daily. 07/20/21   Osvaldo Shipper, MD  ferrous sulfate 325 (65 FE) MG tablet Take 1 tablet (325 mg total) by mouth 2 (two) times daily with a meal. 07/20/21   Osvaldo Shipper, MD  Fluticasone-Umeclidin-Vilant (TRELEGY  ELLIPTA) 100-62.5-25 MCG/ACT AEPB Inhale 1 puff into the lungs daily. 05/18/21   [provider]  folic acid (FOLVITE) 1 MG tablet Take 1 tablet (1 mg total) by mouth daily. 07/20/21   Osvaldo Shipper, MD  gabapentin (NEURONTIN) 600 MG tablet Take 1 tablet (600 mg total) by mouth 3 (three) times daily. 07/20/21   Osvaldo Shipper, MD  insulin aspart (NOVOLOG) 100 UNIT/ML injection Inject 12 Units into the skin 3 (three) times daily with meals. 07/20/21   Osvaldo Shipper, MD  insulin aspart (NOVOLOG) 100 UNIT/ML injection Use as per following sliding scale: CBG 70 - 120: 0 units CBG 121 - 150: 3 units CBG 151 - 200: 4 units CBG 201 - 250: 7 units CBG 251 - 300: 11 units CBG 301 - 350: 15 units CBG 351 - 400: 20 units and call MD/APP for further instructions 07/20/21   Osvaldo Shipper, MD  insulin glargine-yfgn (SEMGLEE) 100 UNIT/ML injection Inject 80 Units into the skin 3 (three) times daily.    [provider]  Lacosamide 100 MG TABS Take 1 tablet (100 mg total) by mouth 2 (two) times daily. 07/20/21   Osvaldo Shipper, MD  levETIRAcetam (KEPPRA) 1000 MG tablet Take 1 tablet (1,000 mg total) by mouth 2 (two) times daily. 07/20/21   Osvaldo Shipper, MD  lisinopril (ZESTRIL) 10 MG tablet Take 1 tablet (10 mg total) by mouth daily. 07/20/21   Osvaldo Shipper, MD  megestrol (MEGACE) 40 MG tablet Take 1 tablet (40 mg total) by mouth 2 (two) times daily. 07/20/21   Osvaldo Shipper, MD  Menthol-Methyl Salicylate (MUSCLE RUB) 10-15 % CREA Apply 1 application topically as needed for muscle pain. 07/20/21   Osvaldo Shipper, MD  oxyCODONE-acetaminophen (PERCOCET) 10-325 MG tablet Take 1 tablet by mouth every 5 (five) hours as needed for pain. 07/20/21   Osvaldo Shipper, MD  polyethylene glycol (MIRALAX / GLYCOLAX) 17 g packet Take 17 g by mouth 2 (two) times daily. 07/20/21   Osvaldo Shipper, MD  QUEtiapine (SEROQUEL) 50 MG tablet Take 1 tablet (50 mg total) by mouth 2 (two) times daily. 07/20/21   Osvaldo Shipper, MD  Rivaroxaban (XARELTO) 15 MG TABS tablet Take 1 tablet (15 mg total) by mouth 2 (two) times daily with a meal for 7 days. 07/20/21 07/27/21  Osvaldo Shipper, MD  rivaroxaban (XARELTO) 20 MG TABS tablet Take 1 tablet (20 mg total) by mouth daily with supper. STARTING 07/28/21 07/28/21   Osvaldo Shipper, MD  senna-docusate (SENOKOT-S) 8.6-50 MG tablet Take 2 tablets by mouth 2 (two) times daily. 07/20/21   Osvaldo Shipper, MD  vitamin B-12 (CYANOCOBALAMIN) 500 MCG tablet Take 1 tablet (500 mcg total) by mouth daily. 07/20/21   Osvaldo Shipper, MD     Allergies  Atorvastatin   Family History  No family history on file.   Physical Exam  Triage Vital Signs: ED Triage Vitals  Enc Vitals Group     BP 02/09/22 0131 (!) 161/78     Pulse Rate 02/09/22 0131 78     Resp 02/09/22 0131 18  Temp 02/09/22 0131 98 F (36.7 C)     Temp src --      SpO2 02/09/22 0131 95 %     Weight 02/09/22 0128 190 lb (86.2 kg)     Height 02/09/22 0128 5\' 2"  (1.575 m)     Head Circumference --      Peak Flow --      Pain Score 02/09/22 0129 9     Pain Loc --      Pain Edu? --      Excl. in GC? --     Updated Vital Signs: BP (!) 161/78 (BP Location: Left Arm)   Pulse 78   Temp 98 F (36.7 C)   Resp 18   Ht 5\' 2"  (1.575 m)   Wt 86.2 kg   LMP 08/15/2008   SpO2 95%   BMI 34.75 kg/m    General: Awake, no distress.  CV:  RRR.  Good peripheral perfusion.  Resp:  Normal effort.  CTA B. Abd:  Nontender to light or deep palpation.  No distention.  Other:  No truncal vesicles.   ED Results / Procedures / Treatments  Labs (all labs ordered are listed, but only abnormal results are displayed) Labs Reviewed  URINALYSIS, ROUTINE W REFLEX MICROSCOPIC     EKG  None   RADIOLOGY I have independently visualized and interpreted patient's x-ray as well as noted the radiology interpretation:  KUB: Moderate stool burden, cholelithiasis  Official radiology report(s): DG Abdomen 1  View  Result Date: 02/09/2022 CLINICAL DATA:  Constipation EXAM: ABDOMEN - 1 VIEW COMPARISON:  06/21/2021 FINDINGS: Normal abdominal gas pattern. No free intraperitoneal gas. Moderate stool burden within the transverse, descending and rectosigmoid colon. 2.3 cm rounded calcification within the right upper quadrant in keeping with a probable gallstone. Possible 4 mm left renal calculus. Multiple phleboliths noted within the pelvis. No organomegaly. Osseous structures are age-appropriate. IMPRESSION: 1. Moderate stool burden. 2. Cholelithiasis. 3. Possible 4 mm left renal calculus.  Un Electronically Signed   By: 02/11/2022 M.D.   On: 02/09/2022 02:11     PROCEDURES:  Critical Care performed: No  Fecal disimpaction  Date/Time: 02/09/2022 5:13 AM  Performed by: 02/11/2022, MD Authorized by: 02/11/2022, MD  Consent: Verbal consent obtained. Risks and benefits: risks, benefits and alternatives were discussed Consent given by: patient Patient understanding: patient states understanding of the procedure being performed Imaging studies: imaging studies available Patient identity confirmed: verbally with patient and arm band Local anesthesia used: yes Anesthesia method: topical.  Anesthesia: Local anesthesia used: yes  Sedation: Patient sedated: no  Patient tolerance: patient tolerated the procedure well with no immediate complications      MEDICATIONS ORDERED IN ED: Medications  lidocaine (XYLOCAINE) 2 % jelly 1 Application (has no administration in time range)  docusate (COLACE) 50 MG/5ML liquid 100 mg (100 mg Oral Given 02/09/22 0456)  lactulose (CHRONULAC) 10 GM/15ML solution 30 g (30 g Oral Given 02/09/22 0457)  lactulose (CHRONULAC) 10 GM/15ML solution 30 g (30 g Per Tube Given 02/09/22 0455)     IMPRESSION / MDM / ASSESSMENT AND PLAN / ED COURSE  I reviewed the triage vital signs and the nursing notes.                             61 year old female presenting with  constipation.  Differential diagnosis includes but is not limited to opioid-induced constipation, dehydration, ileus, SBO, etc.  Patient's presentation is most consistent with acute, uncomplicated illness.  KUB demonstrates moderate stool burden.  Discussed with patient who would like to proceed with enema.  Considered Relistor but it is to be taken if laxatives fail.  Clinical Course as of 02/09/22 0727  Wed Feb 09, 2022  0513 Patient only tolerated 10 cc of soapsuds enema.  She allowed me to try to disimpact her.  I was able to disimpact several medium sized pieces of stool.  She is currently taking a break and we may attempt disimpaction again. [JS]  0550 Patient successfully passed 3 large formed stool logs and feels significantly better.  She is smiling and satisfied.  Will discharge home with prescription for Lactulose and bowel regimen to regulate her bowel movements.  Strict return precautions given.  Patient and spouse verbalized understanding agree with plan of care. [JS]    Clinical Course User Index [JS] Irean Hong, MD     FINAL CLINICAL IMPRESSION(S) / ED DIAGNOSES   Final diagnoses:  Constipation due to opioid therapy     Rx / DC Orders   ED Discharge Orders          Ordered    lactulose (CHRONULAC) 10 GM/15ML solution  Daily PRN        02/09/22 0552             Note:  This document was prepared using Dragon voice recognition software and may include unintentional dictation errors.   Irean Hong, MD 02/09/22 [phone]

## 2022-02-20 ENCOUNTER — Other Ambulatory Visit: Payer: Self-pay

## 2022-02-20 ENCOUNTER — Encounter: Payer: Self-pay | Admitting: Emergency Medicine

## 2022-02-20 ENCOUNTER — Emergency Department: Payer: 59

## 2022-02-20 ENCOUNTER — Observation Stay: Payer: 59

## 2022-02-20 ENCOUNTER — Observation Stay (HOSPITAL_BASED_OUTPATIENT_CLINIC_OR_DEPARTMENT_OTHER)
Admission: EM | Admit: 2022-02-20 | Discharge: 2022-02-21 | Discharge status: Against Medical Advice | Payer: 59 | Source: Home / Self Care | Attending: Emergency Medicine | Admitting: Emergency Medicine

## 2022-02-20 DIAGNOSIS — I160 Hypertensive urgency: Secondary | ICD-10-CM | POA: Insufficient documentation

## 2022-02-20 DIAGNOSIS — J45909 Unspecified asthma, uncomplicated: Secondary | ICD-10-CM | POA: Insufficient documentation

## 2022-02-20 DIAGNOSIS — Z79899 Other long term (current) drug therapy: Secondary | ICD-10-CM | POA: Insufficient documentation

## 2022-02-20 DIAGNOSIS — R112 Nausea with vomiting, unspecified: Secondary | ICD-10-CM | POA: Diagnosis present

## 2022-02-20 DIAGNOSIS — Z794 Long term (current) use of insulin: Secondary | ICD-10-CM | POA: Insufficient documentation

## 2022-02-20 DIAGNOSIS — K5900 Constipation, unspecified: Secondary | ICD-10-CM | POA: Insufficient documentation

## 2022-02-20 DIAGNOSIS — Z7901 Long term (current) use of anticoagulants: Secondary | ICD-10-CM | POA: Insufficient documentation

## 2022-02-20 DIAGNOSIS — T391X2A Poisoning by 4-Aminophenol derivatives, intentional self-harm, initial encounter: Secondary | ICD-10-CM | POA: Diagnosis not present

## 2022-02-20 DIAGNOSIS — E119 Type 2 diabetes mellitus without complications: Secondary | ICD-10-CM | POA: Insufficient documentation

## 2022-02-20 DIAGNOSIS — Z20822 Contact with and (suspected) exposure to covid-19: Secondary | ICD-10-CM | POA: Insufficient documentation

## 2022-02-20 DIAGNOSIS — T50901A Poisoning by unspecified drugs, medicaments and biological substances, accidental (unintentional), initial encounter: Secondary | ICD-10-CM | POA: Diagnosis not present

## 2022-02-20 DIAGNOSIS — G8929 Other chronic pain: Secondary | ICD-10-CM | POA: Insufficient documentation

## 2022-02-20 DIAGNOSIS — Z86718 Personal history of other venous thrombosis and embolism: Secondary | ICD-10-CM | POA: Insufficient documentation

## 2022-02-20 DIAGNOSIS — I10 Essential (primary) hypertension: Secondary | ICD-10-CM | POA: Insufficient documentation

## 2022-02-20 LAB — BASIC METABOLIC PANEL
Anion gap: 9 (ref 5–15)
BUN: 10 mg/dL (ref 6–20)
CO2: 25 mmol/L (ref 22–32)
Calcium: 9.3 mg/dL (ref 8.9–10.3)
Chloride: 106 mmol/L (ref 98–111)
Creatinine, Ser: 0.63 mg/dL (ref 0.44–1.00)
GFR, Estimated: >60 mL/min (ref >60)
Glucose, Bld: 113 mg/dL — ABNORMAL HIGH (ref 70–99)
Potassium: 3.5 mmol/L (ref 3.5–5.1)
Sodium: 140 mmol/L (ref 135–145)

## 2022-02-20 LAB — URINALYSIS, ROUTINE W REFLEX MICROSCOPIC
Bilirubin Urine: NEGATIVE
Glucose, UA: NEGATIVE mg/dL
Hgb urine dipstick: NEGATIVE
Ketones, ur: 15 mg/dL — AB
Leukocytes,Ua: NEGATIVE
Nitrite: NEGATIVE
Protein, ur: 30 mg/dL — AB
Specific Gravity, Urine: 1.01 (ref 1.005–1.030)
pH: 5.5 (ref 5.0–8.0)

## 2022-02-20 LAB — LIPASE, BLOOD: Lipase: 21 U/L (ref 11–51)

## 2022-02-20 LAB — CBC
HCT: 37 % (ref 36.0–46.0)
Hemoglobin: 10.6 g/dL — ABNORMAL LOW (ref 12.0–15.0)
MCH: 22.2 pg — ABNORMAL LOW (ref 26.0–34.0)
MCHC: 28.6 g/dL — ABNORMAL LOW (ref 30.0–36.0)
MCV: 77.6 fL — ABNORMAL LOW (ref 80.0–100.0)
Platelets: 449 10*3/uL — ABNORMAL HIGH (ref 150–400)
RBC: 4.77 MIL/uL (ref 3.87–5.11)
RDW: 16.1 % — ABNORMAL HIGH (ref 11.5–15.5)
WBC: 10.6 10*3/uL — ABNORMAL HIGH (ref 4.0–10.5)
nRBC: 0 % (ref 0.0–0.2)

## 2022-02-20 LAB — RESP PANEL BY RT-PCR (FLU A&B, COVID) ARPGX2
Influenza A by PCR: NEGATIVE
Influenza B by PCR: NEGATIVE
SARS Coronavirus 2 by RT PCR: NEGATIVE

## 2022-02-20 LAB — URINALYSIS, MICROSCOPIC (REFLEX): RBC / HPF: NONE SEEN RBC/hpf (ref 0–5)

## 2022-02-20 LAB — GLUCOSE, CAPILLARY
Glucose-Capillary: 172 mg/dL — ABNORMAL HIGH (ref 70–99)
Glucose-Capillary: 163 mg/dL — ABNORMAL HIGH (ref 70–99)

## 2022-02-20 LAB — POC URINE PREG, ED: Preg Test, Ur: NEGATIVE

## 2022-02-20 LAB — TROPONIN I (HIGH SENSITIVITY)
Troponin I (High Sensitivity): 5 ng/L (ref <18)
Troponin I (High Sensitivity): 3 ng/L (ref <18)

## 2022-02-20 MED ORDER — METOCLOPRAMIDE HCL 5 MG/ML IJ SOLN
5.0000 mg | Freq: Four times a day (QID) | INTRAMUSCULAR | Status: DC
  Administered 2022-02-20 – 2022-02-21 (×3): 5 mg via INTRAVENOUS
  Filled 2022-02-20 (×4): qty 2

## 2022-02-20 MED ORDER — PROGESTERONE MICRONIZED 100 MG PO CAPS
200.0000 mg | ORAL_CAPSULE | Freq: Every day | ORAL | Status: DC
  Filled 2022-02-20 (×2): qty 2

## 2022-02-20 MED ORDER — LEVETIRACETAM IN NACL 1000 MG/100ML IV SOLN
1000.0000 mg | Freq: Two times a day (BID) | INTRAVENOUS | Status: DC
  Administered 2022-02-20 – 2022-02-21 (×3): 1000 mg via INTRAVENOUS
  Filled 2022-02-20 (×4): qty 100

## 2022-02-20 MED ORDER — VENLAFAXINE HCL ER 75 MG PO CP24
75.0000 mg | ORAL_CAPSULE | Freq: Every day | ORAL | Status: DC
  Filled 2022-02-20: qty 1

## 2022-02-20 MED ORDER — HALOPERIDOL LACTATE 5 MG/ML IJ SOLN
2.5000 mg | Freq: Once | INTRAMUSCULAR | Status: AC
  Administered 2022-02-20: 2.5 mg via INTRAVENOUS
  Filled 2022-02-20: qty 1

## 2022-02-20 MED ORDER — VENLAFAXINE HCL ER 150 MG PO CP24
150.0000 mg | ORAL_CAPSULE | Freq: Every day | ORAL | Status: DC
  Filled 2022-02-20 (×2): qty 1

## 2022-02-20 MED ORDER — MEGESTROL ACETATE 20 MG PO TABS
40.0000 mg | ORAL_TABLET | Freq: Two times a day (BID) | ORAL | Status: DC
  Filled 2022-02-20 (×3): qty 2

## 2022-02-20 MED ORDER — LACOSAMIDE 200 MG/20ML IV SOLN
100.0000 mg | Freq: Two times a day (BID) | INTRAVENOUS | Status: DC
Start: 2022-02-20 — End: 2022-02-22
  Administered 2022-02-20 – 2022-02-21 (×2): 100 mg via INTRAVENOUS
  Filled 2022-02-20 (×3): qty 10

## 2022-02-20 MED ORDER — LABETALOL HCL 5 MG/ML IV SOLN
5.0000 mg | Freq: Once | INTRAVENOUS | Status: AC
  Administered 2022-02-20: 5 mg via INTRAVENOUS
  Filled 2022-02-20: qty 4

## 2022-02-20 MED ORDER — POLYETHYLENE GLYCOL 3350 17 G PO PACK
17.0000 g | PACK | Freq: Two times a day (BID) | ORAL | Status: DC
  Filled 2022-02-20 (×2): qty 1

## 2022-02-20 MED ORDER — IOHEXOL 350 MG/ML SOLN
100.0000 mL | Freq: Once | INTRAVENOUS | Status: AC | PRN
  Administered 2022-02-20: 100 mL via INTRAVENOUS

## 2022-02-20 MED ORDER — MORPHINE SULFATE (PF) 2 MG/ML IV SOLN
2.0000 mg | INTRAVENOUS | Status: DC | PRN
  Administered 2022-02-20 – 2022-02-21 (×4): 2 mg via INTRAVENOUS
  Filled 2022-02-20 (×5): qty 1

## 2022-02-20 MED ORDER — INSULIN ASPART 100 UNIT/ML IJ SOLN
0.0000 [IU] | Freq: Four times a day (QID) | INTRAMUSCULAR | Status: DC
  Administered 2022-02-20 – 2022-02-21 (×4): 4 [IU] via SUBCUTANEOUS
  Administered 2022-02-21: 7 [IU] via SUBCUTANEOUS
  Filled 2022-02-20 (×5): qty 1

## 2022-02-20 MED ORDER — ACETAMINOPHEN 325 MG PO TABS: 650.0000 mg | ORAL_TABLET | Freq: Four times a day (QID) | ORAL | Status: DC | PRN

## 2022-02-20 MED ORDER — SODIUM CHLORIDE 0.9 % IV SOLN
12.5000 mg | Freq: Once | INTRAVENOUS | Status: AC
  Administered 2022-02-20: 12.5 mg via INTRAVENOUS
  Filled 2022-02-20: qty 0.5

## 2022-02-20 MED ORDER — FOLIC ACID 1 MG PO TABS
1.0000 mg | ORAL_TABLET | Freq: Every day | ORAL | Status: DC
  Administered 2022-02-20: 1 mg via ORAL
  Filled 2022-02-20 (×2): qty 1

## 2022-02-20 MED ORDER — SENNOSIDES-DOCUSATE SODIUM 8.6-50 MG PO TABS
2.0000 | ORAL_TABLET | Freq: Two times a day (BID) | ORAL | Status: DC
  Filled 2022-02-20 (×2): qty 2

## 2022-02-20 MED ORDER — LISINOPRIL 10 MG PO TABS
10.0000 mg | ORAL_TABLET | Freq: Every day | ORAL | Status: DC
  Administered 2022-02-20 – 2022-02-21 (×2): 10 mg via ORAL
  Filled 2022-02-20 (×3): qty 1

## 2022-02-20 MED ORDER — RIVAROXABAN 20 MG PO TABS
20.0000 mg | ORAL_TABLET | Freq: Every day | ORAL | Status: DC
  Administered 2022-02-20 – 2022-02-21 (×2): 20 mg via ORAL
  Filled 2022-02-20 (×2): qty 1

## 2022-02-20 MED ORDER — METOCLOPRAMIDE HCL 5 MG/ML IJ SOLN
10.0000 mg | Freq: Once | INTRAMUSCULAR | Status: AC
  Administered 2022-02-20: 10 mg via INTRAVENOUS
  Filled 2022-02-20: qty 2

## 2022-02-20 MED ORDER — ACETAMINOPHEN 650 MG RE SUPP: 650.0000 mg | Freq: Four times a day (QID) | RECTAL | Status: DC | PRN

## 2022-02-20 MED ORDER — PROCHLORPERAZINE EDISYLATE 10 MG/2ML IJ SOLN
10.0000 mg | Freq: Four times a day (QID) | INTRAMUSCULAR | Status: DC | PRN
  Administered 2022-02-20: 10 mg via INTRAVENOUS
  Filled 2022-02-20 (×2): qty 2

## 2022-02-20 MED ORDER — INSULIN GLARGINE-YFGN 100 UNIT/ML ~~LOC~~ SOLN
30.0000 [IU] | Freq: Two times a day (BID) | SUBCUTANEOUS | Status: DC
Start: 2022-02-20 — End: 2022-02-22
  Filled 2022-02-20 (×3): qty 0.3

## 2022-02-20 MED ORDER — HYDRALAZINE HCL 20 MG/ML IJ SOLN: 10.0000 mg | INTRAMUSCULAR | Status: DC | PRN

## 2022-02-20 MED ORDER — AMLODIPINE BESYLATE 5 MG PO TABS
5.0000 mg | ORAL_TABLET | Freq: Every day | ORAL | Status: DC
  Administered 2022-02-20 – 2022-02-21 (×2): 5 mg via ORAL
  Filled 2022-02-20 (×3): qty 1

## 2022-02-20 MED ORDER — EZETIMIBE 10 MG PO TABS
10.0000 mg | ORAL_TABLET | Freq: Every day | ORAL | Status: DC
  Administered 2022-02-20 – 2022-02-21 (×2): 10 mg via ORAL
  Filled 2022-02-20 (×2): qty 1

## 2022-02-20 MED ORDER — SODIUM CHLORIDE 0.9 % IV BOLUS
500.0000 mL | Freq: Once | INTRAVENOUS | Status: AC
Start: 2022-02-20 — End: 2022-02-20
  Administered 2022-02-20: 500 mL via INTRAVENOUS

## 2022-02-20 MED ORDER — MORPHINE SULFATE (PF) 4 MG/ML IV SOLN
4.0000 mg | Freq: Once | INTRAVENOUS | Status: AC
  Administered 2022-02-20: 4 mg via INTRAVENOUS
  Filled 2022-02-20: qty 1

## 2022-02-20 MED ORDER — FERROUS SULFATE 325 (65 FE) MG PO TABS
325.0000 mg | ORAL_TABLET | Freq: Two times a day (BID) | ORAL | Status: DC
Start: 2022-02-20 — End: 2022-02-22
  Administered 2022-02-21: 325 mg via ORAL
  Filled 2022-02-20 (×3): qty 1

## 2022-02-20 MED ORDER — CYANOCOBALAMIN 500 MCG PO TABS
500.0000 ug | ORAL_TABLET | Freq: Every day | ORAL | Status: DC
  Administered 2022-02-20: 500 ug via ORAL
  Filled 2022-02-20 (×2): qty 1

## 2022-02-20 MED ORDER — PREGABALIN 75 MG PO CAPS
75.0000 mg | ORAL_CAPSULE | Freq: Three times a day (TID) | ORAL | Status: DC
  Administered 2022-02-20 – 2022-02-21 (×3): 75 mg via ORAL
  Filled 2022-02-20 (×4): qty 1

## 2022-02-20 MED ORDER — LACTATED RINGERS IV SOLN: INTRAVENOUS | Status: DC

## 2022-02-20 MED ORDER — PRAZOSIN HCL 2 MG PO CAPS
4.0000 mg | ORAL_CAPSULE | Freq: Every day | ORAL | Status: DC
  Filled 2022-02-20 (×2): qty 2

## 2022-02-20 NOTE — ED Notes (Signed)
Pt requesting o2 via [place]. Pt uses oxygen at home at night. Sats 98% RA. Pt placed on 1LNC for comfort

## 2022-02-20 NOTE — H&P (Addendum)
H&P:    Lauren Velazquez   FXT:[id number] DOB: 04-Sep-1960 DOA: 02/20/2022  PCP: Hillery Aldo, MD  Chief Complaint: nausea, vomiting, abdominal pain   History of Present Illness:    HPI: Lauren Velazquez is a 61 y.o. female with a past medical history of hypertension, obesity, hypertension, asthma, interdependent diabetes mellitus type 2, dyslipidemia, iron deficiency anemia, seizure disorder.  This patient presents to the emergency department with nausea and vomiting for the past 3 to 4 days.  Mostly dry heaving but occasional active emesis that is nonbloody nonbilious.  Husband also has diarrhea but no other symptomatology.  Some epigastric abdominal pain.  No significant diarrhea.  Denies any alcohol use.  No fevers or chills.  But has been having cold sweats. No consumption of strange foods lately.  No urinary complaints. States she had similar symptoms 3 months ago and was diagnosed with pancreatitis.  This was treated at a hospital in [place], [place].  ED Course: Given IV fluids, morphine, Reglan, Phenergan, Haldol.  No significant improvement in nausea and vomiting.  CT imaging was unremarkable.  Blood pressure was elevated and was given some labetalol.    ROS:   13 point review of systems is negative except for what is mentioned above in the HPI.   Past Medical History:   Past Medical History:  Diagnosis Date   Anemia    not currently under treatment   Anxiety    Asthma    during allergy season   Diabetes mellitus without complication (HCC)    Hypertension    Vertigo     Past Surgical History:   Past Surgical History:  Procedure Laterality Date   CARPAL TUNNEL RELEASE Right 2015   KNEE ARTHROSCOPY Left 05/17/2018   Procedure: ARTHROSCOPY KNEE WITH LATERAL RELEASE, PARTIAL SYNOVECTOMY;  Surgeon: Kennedy Bucker, MD;  Location: [facility];  Service: Orthopedics;  Laterality: Left;   KNEE ARTHROSCOPY WITH LATERAL RELEASE Right 12/28/2017   Procedure: KNEE  ARTHROSCOPY WITH LATERAL RELEASE;  Surgeon: Kennedy Bucker, MD;  Location: [facility];  Service: Orthopedics;  Laterality: Right;   TIBIAL TUBERCLERPLASTY Bilateral 06/01/2017   Procedure: TIBIAL TUBERCLE SPUR EXCISION;  Surgeon: Kennedy Bucker, MD;  Location: [facility];  Service: Orthopedics;  Laterality: Bilateral;    Social History:   Social History   Socioeconomic History   Marital status: Married    Spouse name: Not on file   Number of children: Not on file   Years of education: Not on file   Highest education level: Not on file  Occupational History   Not on file  Tobacco Use   Smoking status: Never   Smokeless tobacco: Never  Vaping Use   Vaping Use: Never used  Substance and Sexual Activity   Alcohol use: No   Drug use: No   Sexual activity: Not on file  Other Topics Concern   Not on file  Social History Narrative   Not on file   Social Determinants of Health   Financial Resource Strain: Not on file  Food Insecurity: Not on file  Transportation Needs: Not on file  Physical Activity: Not on file  Stress: Not on file  Social Connections: Not on file  Intimate Partner Violence: Not on file    Allergies:   Allergies  Allergen Reactions   Atorvastatin Swelling    Family History:   No family history on file.   Current Medications:   Prior to Admission medications   Medication Sig Start Date End Date  Taking? Authorizing Provider  albuterol (VENTOLIN HFA) 108 (90 Base) MCG/ACT inhaler Inhale 2 puffs into the lungs every 6 (six) hours as needed. 05/08/19   [provider]  amLODipine (NORVASC) 5 MG tablet Take 1 tablet (5 mg total) by mouth daily. 07/20/21   Osvaldo Shipper, MD  bisacodyl (DULCOLAX) 10 MG suppository Place 1 suppository (10 mg total) rectally daily as needed for moderate constipation. Patient not taking: Reported on 02/20/2022 07/20/21   Osvaldo Shipper, MD  ezetimibe (ZETIA) 10 MG tablet Take 1 tablet (10 mg total) by mouth daily. 07/20/21    Osvaldo Shipper, MD  ferrous sulfate 325 (65 FE) MG tablet Take 1 tablet (325 mg total) by mouth 2 (two) times daily with a meal. 07/20/21   Osvaldo Shipper, MD  Fluticasone-Umeclidin-Vilant (TRELEGY ELLIPTA) 100-62.5-25 MCG/ACT AEPB Inhale 1 puff into the lungs daily. 05/18/21   [provider]  folic acid (FOLVITE) 1 MG tablet Take 1 tablet (1 mg total) by mouth daily. 07/20/21   Osvaldo Shipper, MD  gabapentin (NEURONTIN) 600 MG tablet Take 1 tablet (600 mg total) by mouth 3 (three) times daily. 07/20/21   Osvaldo Shipper, MD  insulin aspart (NOVOLOG) 100 UNIT/ML injection Inject 12 Units into the skin 3 (three) times daily with meals. 07/20/21   Osvaldo Shipper, MD  insulin aspart (NOVOLOG) 100 UNIT/ML injection Use as per following sliding scale: CBG 70 - 120: 0 units CBG 121 - 150: 3 units CBG 151 - 200: 4 units CBG 201 - 250: 7 units CBG 251 - 300: 11 units CBG 301 - 350: 15 units CBG 351 - 400: 20 units and call MD/APP for further instructions 07/20/21   Osvaldo Shipper, MD  insulin glargine-yfgn (SEMGLEE) 100 UNIT/ML injection Inject 80 Units into the skin 3 (three) times daily.    [provider]  Lacosamide 100 MG TABS Take 1 tablet (100 mg total) by mouth 2 (two) times daily. 07/20/21   Osvaldo Shipper, MD  lactulose (CHRONULAC) 10 GM/15ML solution Take 30 mLs (20 g total) by mouth daily as needed for mild constipation. 02/09/22   Irean Hong, MD  levETIRAcetam (KEPPRA) 1000 MG tablet Take 1 tablet (1,000 mg total) by mouth 2 (two) times daily. 07/20/21   Osvaldo Shipper, MD  lisinopril (ZESTRIL) 10 MG tablet Take 1 tablet (10 mg total) by mouth daily. 07/20/21   Osvaldo Shipper, MD  megestrol (MEGACE) 40 MG tablet Take 1 tablet (40 mg total) by mouth 2 (two) times daily. 07/20/21   Osvaldo Shipper, MD  Menthol-Methyl Salicylate (MUSCLE RUB) 10-15 % CREA Apply 1 application topically as needed for muscle pain. 07/20/21   Osvaldo Shipper, MD  ondansetron (ZOFRAN-ODT) 8 MG  disintegrating tablet Take 8 mg by mouth every 8 (eight) hours as needed. 02/17/22   [provider]  oxyCODONE-acetaminophen (PERCOCET) 10-325 MG tablet Take 1 tablet by mouth every 5 (five) hours as needed for pain. 07/20/21   Osvaldo Shipper, MD  polyethylene glycol (MIRALAX / GLYCOLAX) 17 g packet Take 17 g by mouth 2 (two) times daily. 07/20/21   Osvaldo Shipper, MD  prazosin (MINIPRESS) 1 MG capsule Take 4 mg by mouth at bedtime. 12/02/21   [provider]  pregabalin (LYRICA) 75 MG capsule Take 75 mg by mouth 3 (three) times daily. 02/03/22   [provider]  progesterone (PROMETRIUM) 200 MG capsule Take 200 mg by mouth at bedtime. 01/21/22   [provider]  QUEtiapine (SEROQUEL) 50 MG tablet Take 1 tablet (50 mg  total) by mouth 2 (two) times daily. 07/20/21   Osvaldo Shipper, MD  Rivaroxaban (XARELTO) 15 MG TABS tablet Take 1 tablet (15 mg total) by mouth 2 (two) times daily with a meal for 7 days. 07/20/21 07/27/21  Osvaldo Shipper, MD  rivaroxaban (XARELTO) 20 MG TABS tablet Take 1 tablet (20 mg total) by mouth daily with supper. STARTING 07/28/21 07/28/21   Osvaldo Shipper, MD  senna-docusate (SENOKOT-S) 8.6-50 MG tablet Take 2 tablets by mouth 2 (two) times daily. 07/20/21   Osvaldo Shipper, MD  venlafaxine XR (EFFEXOR-XR) 75 MG 24 hr capsule Take 75 mg by mouth. 75mg  qam and 150mg  qhs 12/01/21   [provider]  vitamin B-12 (CYANOCOBALAMIN) 500 MCG tablet Take 1 tablet (500 mcg total) by mouth daily. 07/20/21   12/03/21, MD     Physical Exam:   Vitals:   02/20/22 1130 02/20/22 1300 02/20/22 1330 02/20/22 1440  BP: (!) 191/97 (!) 163/74 (!) 157/78 (!) 178/81  Pulse: 76 65 70 73  Resp:  10 17 18   Temp:      TempSrc:      SpO2: 95% 97% 97% 96%  Weight:      Height:         General:  Appears calm and comfortable and is in NAD, diaphoretic Cardiovascular:  RRR, no m/r/g.  Respiratory:   CTA bilaterally with no wheezes/rales/rhonchi.   Normal respiratory effort. Abdomen:  soft, mild TTP epigastric and RUQ, ND, NABS, obese abdomen Skin:  no rash or induration seen on limited exam Musculoskeletal:  grossly normal tone BUE/BLE, good ROM, no bony abnormality Lower extremity:  No LE edema.  Limited foot exam with no ulcerations.  2+ distal pulses. Psychiatric:  grossly normal mood and affect, speech fluent and appropriate, AOx3 Neurologic:  CN 2-12 grossly intact, moves all extremities in coordinated fashion, sensation intact    Data Review:    Radiological Exams on Admission: Independently reviewed - see discussion in A/P where applicable  CT Angio Chest/Abd/Pel for Dissection W and/or Wo Contrast  Result Date: 02/20/2022 CLINICAL DATA:  Clinical suspicion for acute aortic syndrome. Patient presents with chest pain, nausea vomiting and weakness. EXAM: CT ANGIOGRAPHY CHEST, ABDOMEN AND PELVIS TECHNIQUE: Non-contrast CT of the chest was initially obtained. Multidetector CT imaging through the chest, abdomen and pelvis was performed using the standard protocol during bolus administration of intravenous contrast. Multiplanar reconstructed images and MIPs were obtained and reviewed to evaluate the vascular anatomy. RADIATION DOSE REDUCTION: This exam was performed according to the departmental dose-optimization program which includes automated exposure control, adjustment of the mA and/or kV according to patient size and/or use of iterative reconstruction technique. CONTRAST:  04/23/22 OMNIPAQUE IOHEXOL 350 MG/ML SOLN COMPARISON:  Current chest radiograph. CT abdomen and pelvis dated 06/20/2021. FINDINGS: CTA CHEST FINDINGS Cardiovascular: Thoracic aorta is normal in caliber. No dissection. Minimal descending thoracic aortic atherosclerosis. Arch branch vessels are widely patent. Heart normal in size and configuration. Minor left coronary artery calcifications. No pericardial effusion. Mediastinum/Nodes: No enlarged mediastinal, hilar, or  axillary lymph nodes. Thyroid gland, trachea, and esophagus demonstrate no significant findings. Lungs/Pleura: Lungs are clear. No pleural effusion or pneumothorax. Musculoskeletal: No fracture or acute finding. No bone lesion. No chest wall mass Review of the MIP images confirms the above findings. CTA ABDOMEN AND PELVIS FINDINGS VASCULAR Aorta: Minor atherosclerosis. No aneurysm. No dissection. No significant stenosis. Celiac: Patent without evidence of aneurysm, dissection, vasculitis or significant stenosis. SMA: Patent without evidence of aneurysm, dissection, vasculitis or  significant stenosis. Renals: Both renal arteries are patent without evidence of aneurysm, dissection, vasculitis, fibromuscular dysplasia or significant stenosis. IMA: Patent without evidence of aneurysm, dissection, vasculitis or significant stenosis. Inflow: Patent without evidence of aneurysm, dissection, vasculitis or significant stenosis. Veins: No obvious venous abnormality within the limitations of this arterial phase study. Review of the MIP images confirms the above findings. NON-VASCULAR Hepatobiliary: Unremarkable liver. Peripheral calcification along the gallbladder wall, stable. No wall thickening. No adjacent inflammation. No bile duct dilation. Pancreas: No pancreatic mass or inflammation.  No duct dilation. Spleen: Normal in size without focal abnormality. Adrenals/Urinary Tract: Normal adrenal glands. Small nonobstructing stone in the midpole the left kidney. Tiny nonobstructing stone in the anterior midpole the right kidney. Posterior exophytic low-attenuation right renal mass, 1.4 cm, consistent with a cyst, stable. No other masses. No hydronephrosis. Normal ureters. Normal bladder. Stomach/Bowel: Stomach is within normal limits. Appendix appears normal. No evidence of bowel wall thickening, distention, or inflammatory changes. Lymphatic: No enlarged lymph nodes. Reproductive: Left adnexal cystic mass, 4.8 x 4.3 cm,  decreased from 5.4 x 4.4 cm on the prior CT. Normal uterus. No right adnexal mass. Other: No abdominal wall hernia or abnormality. No abdominopelvic ascites. Musculoskeletal: No fracture or acute finding.  No bone lesion. Review of the MIP images confirms the above findings. IMPRESSION: CTA 1. No aortic aneurysm or dissection. Minor atherosclerosis. No findings to suggest acute aortic syndrome. 2. Thoracoabdominal aorta branch vessels are all widely patent. NON CTA 1. No acute findings within the chest, abdomen or pelvis. 2. Gallbladder wall calcification, but no evidence of acute cholecystitis and no change from the prior CT. 3. Left adnexal cyst, decreased in size compared to the prior CT, previously assessed with ultrasound on 07/03/2021. Given the interval decrease in size, this can be considered a benign cyst with no additional follow-up recommended. Electronically Signed   By: Amie Portland M.D.   On: 02/20/2022 13:40   DG Chest 2 View  Result Date: 02/20/2022 CLINICAL DATA:  Shortness of breath. EXAM: CHEST - 2 VIEW COMPARISON:  06/30/2021 FINDINGS: Heart size and mediastinal contours are unremarkable. There is no pleural effusion or edema. No airspace opacities identified. Visualized osseous structures notable for thoracic spondylosis. IMPRESSION: No active cardiopulmonary abnormalities. Electronically Signed   By: Signa Kell M.D.   On: 02/20/2022 09:18    EKG: Independently reviewed.  NSR with rate 68 bpm   Labs on Admission: I have personally reviewed the available labs and imaging studies at the time of the admission.  Pertinent labs on Admission: hgb 10.6, plts 449     Assessment/Plan:   Refractory nausea, vomiting abdominal pain:  Unclear etiology.  No evidence of pancreatitis on CT imaging.  Lipase level unremarkable.  Differential includes diabetic gastroparesis versus viral gastroenteritis.  Start scheduled IV Reglan.  Daily EKG to monitor QTc for the next few days I will trial  antiemetics with Compazine as Zofran and Phenergan have not helped.  IV fluids have been ordered.  Bowel rest by keeping n.p.o.  Will obtain right upper quadrant ultrasound she does have some tenderness to palpation of the right upper quadrant.  Can consider gastric emptying study.  Pain scale ordered.  Hypertensive urgency: Likely due to the above.  Hydralazine IV as needed for SBP greater than 160. Continue home lisinopril, amlodipine and prazosin  Insulin-dependent diabetes mellitus type 2: Currently NPO.  Accu-Cheks every 6 hours with resistant sliding scale insulin.  On insulin glargine 180 units twice daily (not sure  if this is accurate as this sounds like a tremendous amount of insulin).  Start with insulin glargine 30 units twice daily.  Holding home NovoLog 12 units 3 times daily.  Check A1c.  Seizure disorder: Holding home Vimpat and Keppra p.o. and giving IV due to being n.p.o.  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 Attending or Consulting provider 7A - 7P or covering provider during after hours 7P -7A, for this patient?  Check the care team in [facility] and look for a) attending/consulting [facility] provider listed and b) the [facility] team listed Log into [email or web address] and use [facility]'s universal password to access. If you do not have the password, please contact the hospital operator. Locate the [facility] provider you are looking for under [facility] and page to a number that you can be directly reached. If you still have difficulty reaching the provider, please page the [facility] (Director on Call) for the Hospitalists listed on amion for assistance.   02/20/2022, 3:24 PM

## 2022-02-20 NOTE — ED Provider Notes (Addendum)
[facility] Provider Note    Event Date/Time   First MD Initiated Contact with Patient 02/20/22 [phone]     (approximate)   History   Chest Pain, Nausea, Emesis, and Weakness   HPI  Lauren Velazquez is a 61 y.o. female with a history of diabetes, hypertension, asthma, chronic hypoxic respiratory failure on nasal cannula as needed who presents with generalized weakness for the last 4 days, persistent course, associated with nausea and dry heaving that is not relieved by Zofran.  She reports some chest discomfort across the upper chest and has increased shortness of breath but no cough or fever.  She denies any diarrhea or abdominal pain.  She has not required increased oxygen      Physical Exam   Triage Vital Signs: ED Triage Vitals  Enc Vitals Group     BP 02/20/22 0838 (!) 198/84     Pulse Rate 02/20/22 0838 76     Resp 02/20/22 0838 20     Temp 02/20/22 0838 98.4 F (36.9 C)     Temp Source 02/20/22 0838 Oral     SpO2 02/20/22 0838 97 %     Weight 02/20/22 0837 190 lb (86.2 kg)     Height 02/20/22 0837 5\' 2"  (1.575 m)     Head Circumference --      Peak Flow --      Pain Score 02/20/22 0837 8     Pain Loc --      Pain Edu? --      Excl. in GC? --     Most recent vital signs: Vitals:   02/20/22 1330 02/20/22 1440  BP: (!) 157/78 (!) 178/81  Pulse: 70 73  Resp: 17 18  Temp:    SpO2: 97% 96%     General: Alert and oriented, relatively comfortable appearing. CV:  Good peripheral perfusion.  Normal heart sounds. Resp:  Normal effort.  Diminished breath sounds bilaterally but no rales or rhonchi. Abd:  No distention.  Other:  Dry mucous membranes.  No peripheral edema   ED Results / Procedures / Treatments   Labs (all labs ordered are listed, but only abnormal results are displayed) Labs Reviewed  BASIC METABOLIC PANEL - Abnormal; Notable for the following components:      Result Value   Glucose, Bld 113 (*)    All other components  within normal limits  CBC - Abnormal; Notable for the following components:   WBC 10.6 (*)    Hemoglobin 10.6 (*)    MCV 77.6 (*)    MCH 22.2 (*)    MCHC 28.6 (*)    RDW 16.1 (*)    Platelets 449 (*)    All other components within normal limits  URINALYSIS, ROUTINE W REFLEX MICROSCOPIC - Abnormal; Notable for the following components:   Ketones, ur 15 (*)    Protein, ur 30 (*)    All other components within normal limits  URINALYSIS, MICROSCOPIC (REFLEX) - Abnormal; Notable for the following components:   Bacteria, UA FEW (*)    All other components within normal limits  RESP PANEL BY RT-PCR (FLU A&B, COVID) ARPGX2  LIPASE, BLOOD  POC URINE PREG, ED  TROPONIN I (HIGH SENSITIVITY)  TROPONIN I (HIGH SENSITIVITY)     EKG  ED ECG REPORT I, 04/23/22, the attending physician, personally viewed and interpreted this ECG.  Date: 02/20/2022 EKG Time: 0840 Rate: 75 Rhythm: normal sinus rhythm QRS Axis: normal Intervals: normal ST/T Wave  abnormalities: normal Narrative Interpretation: no evidence of acute ischemia  ED ECG REPORT I, Dionne Bucy, the attending physician, personally viewed and interpreted this ECG.  Date: 02/20/2022 EKG Time: 1327 Rate: 68 Rhythm: normal sinus rhythm (incorrectly read by machine as atrial flutter) QRS Axis: normal Intervals: normal ST/T Wave abnormalities: normal Narrative Interpretation: no evidence of acute ischemia   RADIOLOGY  Chest x-ray: I dependently viewed and interpreted the images; there is no focal consolidation or edema  CT angio chest/abdomen/pelvis: IMPRESSION:  CTA    1. No aortic aneurysm or dissection. Minor atherosclerosis. No  findings to suggest acute aortic syndrome.  2. Thoracoabdominal aorta branch vessels are all widely patent.    NON CTA    1. No acute findings within the chest, abdomen or pelvis.  2. Gallbladder wall calcification, but no evidence of acute  cholecystitis and no change  from the prior CT.  3. Left adnexal cyst, decreased in size compared to the prior CT,  previously assessed with ultrasound on 07/03/2021. Given the  interval decrease in size, this can be considered a benign cyst with  no additional follow-up recommended.    PROCEDURES:  Critical Care performed: No  Procedures   MEDICATIONS ORDERED IN ED: Medications  sodium chloride 0.9 % bolus 500 mL (0 mLs Intravenous Stopped 02/20/22 1304)  promethazine (PHENERGAN) 12.5 mg in sodium chloride 0.9 % 50 mL IVPB (0 mg Intravenous Stopped 02/20/22 1243)  labetalol (NORMODYNE) injection 5 mg (5 mg Intravenous Given 02/20/22 1253)  morphine (PF) 4 MG/ML injection 4 mg (4 mg Intravenous Given 02/20/22 1251)  iohexol (OMNIPAQUE) 350 MG/ML injection 100 mL (100 mLs Intravenous Contrast Given 02/20/22 1311)  metoCLOPramide (REGLAN) injection 10 mg (10 mg Intravenous Given 02/20/22 1358)  haloperidol lactate (HALDOL) injection 2.5 mg (2.5 mg Intravenous Given 02/20/22 1437)     IMPRESSION / MDM / ASSESSMENT AND PLAN / ED COURSE  I reviewed the triage vital signs and the nursing notes.  61 year old female with PMH as noted above presents with generalized weakness, nausea, decreased p.o. intake, and shortness of breath as well as some chest discomfort over the last several days.  I reviewed the past medical records.  The patient was admitted in November of last year.  Per the hospitalist discharge summary from 07/20/2021 she initially presented with erratic behavior and shortness of breath, then developed a fever, sepsis, and altered mental status.  She eventually was admitted for possible meningitis and intubated, then developed seizures and was found to have acute infarcts on MRI.  She was eventually extubated and recovered.  On exam today the patient is hypertensive with otherwise normal vital signs.  She has dry mucous membranes but no other significant exam findings.  She is not requiring any oxygen at this  time.  Differential diagnosis includes, but is not limited to, COVID-19, influenza, other viral syndrome, dehydration, electrolyte abnormality, pneumonia, UTI, other infection.  The patient has no altered mental status or somnolence that would suggest hypercapnia or other respiratory failure.  Patient's presentation is most consistent with acute presentation with potential threat to life or bodily function.  Chest x-ray shows no acute abnormalities.  EKG is nonischemic.  Will obtain lab work-up including troponins x2, respiratory panel, and reassess.  The patient is on the cardiac monitor to evaluate for evidence of arrhythmia and/or significant heart rate changes.  ----------------------------------------- 1:08 PM on 02/20/2022 -----------------------------------------  Initial troponin is negative.  Respiratory panel is negative.  Electrolytes are unremarkable.  The patient is reporting  persistent nausea not relieved by Phenergan and is having chest pain.  Her blood pressure remains significantly elevated so I ordered some IV labetalol.  Given the persistent pain, nausea, and hypertension I ordered a CT angio to rule out aortic dissection or other acute vascular etiology of the pain.  ----------------------------------------- 2:46 PM on 02/20/2022 -----------------------------------------  CT angio was negative for dissection, other vascular findings, or other acute intra-abdominal findings.  Repeat EKG shows no ischemic findings.  The work-up is overall very reassuring.  However, the patient continues to have refractory nausea and some chest pain.  Given her diabetic history I suspect possible gastroparesis.  I confirmed that she is still on opiates for chronic back pain but is taking them and has not had any withdrawal symptoms.  She denies any marijuana use.  With the persistent nausea and retching the patient appears quite anxious, pacing around the room.  I discussed the use of Haldol  with her and she agrees.  Given the refractory nausea I consulted Dr. Linna Darner from the hospitalist service; based on her discussion he agrees to admit the patient.    FINAL CLINICAL IMPRESSION(S) / ED DIAGNOSES   Final diagnoses:  Nausea and vomiting, unspecified vomiting type     Rx / DC Orders   ED Discharge Orders     None        Note:  This document was prepared using Dragon voice recognition software and may include unintentional dictation errors.    Dionne Bucy, MD 02/20/22 1448    Dionne Bucy, MD 02/20/22 [phone]

## 2022-02-20 NOTE — ED Triage Notes (Signed)
Pt reports for the past few days has felt bad. Pt c/o CP, NV and weakness. Pt reports pain in chest is across the top and heavy in nature.

## 2022-02-21 DIAGNOSIS — R112 Nausea with vomiting, unspecified: Secondary | ICD-10-CM

## 2022-02-21 LAB — COMPREHENSIVE METABOLIC PANEL
ALT: 16 U/L (ref 0–44)
AST: 15 U/L (ref 15–41)
Albumin: 4.1 g/dL (ref 3.5–5.0)
Alkaline Phosphatase: 71 U/L (ref 38–126)
Anion gap: 9 (ref 5–15)
BUN: 6 mg/dL (ref 6–20)
CO2: 22 mmol/L (ref 22–32)
Calcium: 9.2 mg/dL (ref 8.9–10.3)
Chloride: 109 mmol/L (ref 98–111)
Creatinine, Ser: 0.54 mg/dL (ref 0.44–1.00)
GFR, Estimated: >60 mL/min (ref >60)
Glucose, Bld: 171 mg/dL — ABNORMAL HIGH (ref 70–99)
Potassium: 3.7 mmol/L (ref 3.5–5.1)
Sodium: 140 mmol/L (ref 135–145)
Total Bilirubin: 0.8 mg/dL (ref 0.3–1.2)
Total Protein: 7.6 g/dL (ref 6.5–8.1)

## 2022-02-21 LAB — GLUCOSE, CAPILLARY
Glucose-Capillary: 169 mg/dL — ABNORMAL HIGH (ref 70–99)
Glucose-Capillary: 195 mg/dL — ABNORMAL HIGH (ref 70–99)
Glucose-Capillary: 252 mg/dL — ABNORMAL HIGH (ref 70–99)
Glucose-Capillary: 208 mg/dL — ABNORMAL HIGH (ref 70–99)

## 2022-02-21 LAB — CBC
HCT: 34 % — ABNORMAL LOW (ref 36.0–46.0)
Hemoglobin: 10.1 g/dL — ABNORMAL LOW (ref 12.0–15.0)
MCH: 22.9 pg — ABNORMAL LOW (ref 26.0–34.0)
MCHC: 29.7 g/dL — ABNORMAL LOW (ref 30.0–36.0)
MCV: 77.1 fL — ABNORMAL LOW (ref 80.0–100.0)
Platelets: 470 10*3/uL — ABNORMAL HIGH (ref 150–400)
RBC: 4.41 MIL/uL (ref 3.87–5.11)
RDW: 16.4 % — ABNORMAL HIGH (ref 11.5–15.5)
WBC: 13.7 10*3/uL — ABNORMAL HIGH (ref 4.0–10.5)
nRBC: 0 % (ref 0.0–0.2)

## 2022-02-21 LAB — HEMOGLOBIN A1C
Hgb A1c MFr Bld: 6.7 % — ABNORMAL HIGH (ref 4.8–5.6)
Mean Plasma Glucose: 145.59 mg/dL

## 2022-02-21 MED ORDER — ALUM & MAG HYDROXIDE-SIMETH 200-200-20 MG/5ML PO SUSP
30.0000 mL | ORAL | Status: DC | PRN
  Administered 2022-02-21: 30 mL via ORAL
  Filled 2022-02-21: qty 30

## 2022-02-21 MED ORDER — OXYCODONE-ACETAMINOPHEN 5-325 MG PO TABS
1.0000 | ORAL_TABLET | ORAL | Status: DC | PRN
  Administered 2022-02-21: 1 via ORAL
  Filled 2022-02-21: qty 1

## 2022-02-21 MED ORDER — HYDROMORPHONE HCL 1 MG/ML IJ SOLN
0.5000 mg | Freq: Once | INTRAMUSCULAR | Status: AC
  Administered 2022-02-21: 0.5 mg via INTRAVENOUS
  Filled 2022-02-21: qty 1

## 2022-02-21 MED ORDER — OXYCODONE HCL 5 MG PO TABS
5.0000 mg | ORAL_TABLET | ORAL | Status: DC | PRN
  Administered 2022-02-21: 5 mg via ORAL
  Filled 2022-02-21: qty 1

## 2022-02-21 MED ORDER — OXYCODONE-ACETAMINOPHEN 10-325 MG PO TABS: 1.0000 | ORAL_TABLET | ORAL | Status: DC | PRN

## 2022-02-21 MED ORDER — ADULT MULTIVITAMIN W/MINERALS CH: 1.0000 | ORAL_TABLET | Freq: Every day | ORAL | Status: DC

## 2022-02-21 MED ORDER — BOOST / RESOURCE BREEZE PO LIQD CUSTOM
1.0000 | Freq: Three times a day (TID) | ORAL | Status: DC
  Administered 2022-02-21: 1 via ORAL

## 2022-02-21 MED ORDER — INSULIN ASPART 100 UNIT/ML IJ SOLN: 0.0000 [IU] | Freq: Three times a day (TID) | INTRAMUSCULAR | Status: DC

## 2022-02-21 MED ORDER — SORBITOL 70 % SOLN
960.0000 mL | TOPICAL_OIL | Freq: Once | ORAL | Status: DC
  Filled 2022-02-21: qty 473

## 2022-02-21 MED ORDER — OXYCODONE-ACETAMINOPHEN 5-325 MG PO TABS: 1.0000 | ORAL_TABLET | ORAL | Status: DC | PRN

## 2022-02-21 MED ORDER — OXYCODONE-ACETAMINOPHEN 5-325 MG PO TABS
1.0000 | ORAL_TABLET | ORAL | Status: DC | PRN
  Administered 2022-02-21: 1 via ORAL
  Filled 2022-02-21 (×2): qty 1

## 2022-02-21 MED ORDER — OXYCODONE HCL 5 MG PO TABS: 5.0000 mg | ORAL_TABLET | ORAL | Status: DC | PRN

## 2022-02-21 MED ORDER — POLYETHYLENE GLYCOL 3350 17 G PO PACK
34.0000 g | PACK | ORAL | Status: AC
  Administered 2022-02-21 (×3): 34 g via ORAL
  Filled 2022-02-21 (×4): qty 2

## 2022-02-21 MED ORDER — OXYCODONE HCL 5 MG PO TABS
5.0000 mg | ORAL_TABLET | ORAL | Status: DC | PRN
  Administered 2022-02-21: 5 mg via ORAL
  Filled 2022-02-21 (×2): qty 1

## 2022-02-21 NOTE — Plan of Care (Signed)
  Problem: Coping: Goal: Ability to adjust to condition or change in health will improve Outcome: Progressing   Problem: Health Behavior/Discharge Planning: Goal: Ability to identify and utilize available resources and services will improve Outcome: Progressing   Problem: Metabolic: Goal: Ability to maintain appropriate glucose levels will improve Outcome: Progressing   Problem: Nutritional: Goal: Maintenance of adequate nutrition will improve Outcome: Progressing   Problem: Skin Integrity: Goal: Risk for impaired skin integrity will decrease Outcome: Progressing   Problem: Tissue Perfusion: Goal: Adequacy of tissue perfusion will improve Outcome: Progressing   Problem: Clinical Measurements: Goal: Will remain free from infection Outcome: Progressing   Problem: Clinical Measurements: Goal: Respiratory complications will improve Outcome: Progressing   Problem: Clinical Measurements: Goal: Cardiovascular complication will be avoided Outcome: Progressing   Problem: Safety: Goal: Ability to remain free from injury will improve Outcome: Progressing   Problem: Skin Integrity: Goal: Risk for impaired skin integrity will decrease Outcome: Progressing

## 2022-02-21 NOTE — Plan of Care (Signed)
  Problem: Coping: Goal: Ability to adjust to condition or change in health will improve Outcome: Progressing   Problem: Fluid Volume: Goal: Ability to maintain a balanced intake and output will improve Outcome: Progressing   Problem: Health Behavior/Discharge Planning: Goal: Ability to identify and utilize available resources and services will improve Outcome: Progressing Goal: Ability to manage health-related needs will improve Outcome: Progressing   Problem: Metabolic: Goal: Ability to maintain appropriate glucose levels will improve Outcome: Progressing   Problem: Nutritional: Goal: Maintenance of adequate nutrition will improve Outcome: Progressing Goal: Progress toward achieving an optimal weight will improve Outcome: Progressing   

## 2022-02-21 NOTE — Progress Notes (Signed)
       CROSS COVER NOTE  NAME: Lauren Velazquez MRN: [id number] DOB : 08/21/1960                                                        Against Medical Advice  Patient at this time expresses desire to leave the Hospital immediately, patient has been warned that this is not Medically advisable at this time, and can result in Medical complications like Death and Disability. Specifically discussed ongoing constipation and the recommendation to continue outpatient bowel regimen under the care of her PCP. Recommended scheduling follow-up with PCP as soon as possible.  Patient has full decision making capacity and understands and accepts the risks involved and assumes full responsibility of this decision.  This patient has also been advised that if they feel the need for further medical assistance to return to the closest ER or dial [phone].  Bishop Limbo DNP, MHA, FNP-BC Nurse Practitioner [facility]

## 2022-02-21 NOTE — Progress Notes (Signed)
PROGRESS NOTE    Lauren Velazquez  QQI:[id number] DOB: 02-Sep-1960 DOA: 02/20/2022 PCP: Lauren Aldo, MD  145A/[id number]-AA  LOS: 0 days   Brief hospital course: No notes on file  Assessment & Plan: Lauren Velazquez is a 61 y.o. female with a past medical history of hypertension, obesity, hypertension, asthma, diabetes mellitus type 2, seizure disorder who presented to the emergency department with nausea and vomiting for the past 3 to 4 days.  Mostly dry heaving but occasional active emesis that is nonbloody nonbilious. Some epigastric abdominal pain.  No significant diarrhea.    Nausea, vomiting, abdominal pain:  Unclear etiology.  CTA c/a/p neg for acute finding.  Lipase level unremarkable.  Differential includes diabetic gastroparesis versus viral gastroenteritis, or constipation. --Started on scheduled IV Reglan on admission.   Plan: --clear liquid diet --anti-emetics PRN --treat constipation --cont IV reglan scheduled --cont home oxycodone  --No extra opioids, no IV opioids  Constipation --had a ED presentation for constipation on 02/09/22.  Likely due to large amount of opioids use.  CT scan reviewed by myself, showed stool in almost the entire colon.  This could be causing some of pt's symptoms. Plan: --Miralax 34 g q2h until BM achieved.   --Enema if pt is willing  Chronic pain on chronic opioids --has Rx for Percocet 10 for 5 times per day --cont Percocet 10 q4h PRN --No extra opioids, no IV opioids  Hypertensive urgency:  BP elevated on presentation. Continue home lisinopril, amlodipine and prazosin   Insulin-dependent diabetes mellitus type 2:  --On insulin glargine 180 units twice daily (not sure if this is accurate as this sounds like a tremendous amount of insulin).   --glargine at reduced 30u BID --SSI TID   Seizure disorder:  --cont home Vimpat and Keppra    Iron deficiency anemia:  Continue home ferrous sulfate   Dyslipidemia:  Continue home Zetia   History  of DVT:  Continue home Xarelto   DVT prophylaxis: [id number]  Code Status: Full code  Family Communication: husband updated at bedside today Level of care: Telemetry Medical Dispo:   The patient is from: home Anticipated d/c is to: home Anticipated d/c date is: 1-2 days   Subjective and Interval History:  Pt complained of not feeling well, asking for more pain meds for pain in her back, abdomen and groins.  Complained of nausea but no active vomiting.   Objective: Vitals:   02/21/22 0553 02/21/22 0821 02/21/22 1133 02/21/22 1613  BP: (!) 147/61 (!) 166/81 (!) 162/71 (!) 166/88  Pulse: 70 88 77 82  Resp: 18 16 18 15   Temp: 98.2 F (36.8 C) 98.3 F (36.8 C) 98 F (36.7 C) 98.2 F (36.8 C)  TempSrc:      SpO2: 96% 98% 97% 96%  Weight:      Height:        Intake/Output Summary (Last 24 hours) at 02/21/2022 1855 Last data filed at 02/21/2022 0303 Gross per 24 hour  Intake 1135 ml  Output --  Net 1135 ml   Filed Weights   02/20/22 0837  Weight: 86.2 kg    Examination:   Constitutional: NAD, AAOx3, standing at edge of bed HEENT: conjunctivae and lids normal, EOMI CV: No cyanosis.   RESP: normal respiratory effort, on RA Neuro: II - XII grossly intact.     Data Reviewed: I have personally reviewed labs and imaging studies  Time spent: 50 minutes  04/23/22, MD [facility] If 7PM-7AM, please contact night-coverage 02/21/2022, 6:55 PM

## 2022-02-21 NOTE — Progress Notes (Signed)
Initial Nutrition Assessment  DOCUMENTATION CODES:   Obesity unspecified  INTERVENTION:   Boost Breeze po TID, each supplement provides 250 kcal and 9 grams of protein  MVI po daily   Pt at high refeed risk; recommend monitor potassium, magnesium and phosphorus labs daily until stable  NUTRITION DIAGNOSIS:   Inadequate oral intake related to acute illness as evidenced by meal completion < 25%.  GOAL:   Patient will meet greater than or equal to 90% of their needs  MONITOR:   PO intake, Supplement acceptance, Diet advancement, Labs, Weight trends, Skin, I & O's  REASON FOR ASSESSMENT:   Malnutrition Screening Tool    ASSESSMENT:   61 y/o female with h/o DM, HTN, asthma, seizures, iron deficiency, DVT, OSA, CVA and HLD who is admitted with nausea, vomiting and diarrhea.  Met with pt in room today. Pt is up walking around at time of RD visit; pt is requesting miralax for constipation. Pt reports decreased appetite and oral intake pta r/t nausea and vomiting. Pt reports that her nausea is resolved today. Pt reports eating some juice and broth for lunch; pt is waiting for her diet to be advanced. RD discussed with pt the importance of adequate nutrition needed to preserve lean muscle. Pt would like to have mixed berry Boost breeze. Pt does not like Ensure supplements. Per chart, pt is down 41lbs(18%) since November; this is significant. Pt reports weight loss is in relation to having a stroke in November but reports that her weight is stabilized now.   Medications reviewed and include: ferrous sulfate, folic acid, insulin, megace, reglan, MVI, miralax, senokot, B12, LRS @50ml /hr  Labs reviewed: K 3.7 wnl Wbc- 13.7(H), Hgb 10.1(L), Hct 34.0(L) Cbgs- 252, 195, 169 x 24 hrs AIC 6.7(H)  NUTRITION - FOCUSED PHYSICAL EXAM:  Flowsheet Row Most Recent Value  Orbital Region No depletion  Upper Arm Region No depletion  Thoracic and Lumbar Region No depletion  Buccal Region No  depletion  Temple Region No depletion  Clavicle Bone Region No depletion  Clavicle and Acromion Bone Region No depletion  Scapular Bone Region No depletion  Dorsal Hand No depletion  Patellar Region No depletion  Anterior Thigh Region No depletion  Posterior Calf Region No depletion  Edema (RD Assessment) None  Hair Reviewed  Eyes Reviewed  Mouth Reviewed  Skin Reviewed  Nails Reviewed   Diet Order:   Diet Order             Diet clear liquid Room service appropriate? Yes; Fluid consistency: Thin  Diet effective now                  EDUCATION NEEDS:   Education needs have been addressed  Skin:  Skin Assessment: Reviewed RN Assessment  Last BM:  7/9  Height:   Ht Readings from Last 1 Encounters:  02/20/22 5' 2"  (1.575 m)    Weight:   Wt Readings from Last 1 Encounters:  02/20/22 86.2 kg    Ideal Body Weight:  50 kg  BMI:  Body mass index is 34.75 kg/m.  Estimated Nutritional Needs:   Kcal:  1800-2100kcal/day  Protein:  90-105g/day  Fluid:  1.5-1.8L/day  Koleen Distance MS, RD, LDN Please refer to [facility] for RD and/or RD on-call/weekend/after hours pager

## 2022-02-21 NOTE — Progress Notes (Signed)
This nurse is covering for primary nurse.   Pt called requesting medication to help her sleep and pain medication.  Pt advised that pain medication and medication for nausea has been administered less than an hour ago.  Pt instructed on how to utilized non-pharmacological measures (meditation, deep breathing, diversion activities) to assist in managing pain and that the compazine she just received for nausea may make her drowsy but it may take a while for the medication to work.

## 2022-02-23 ENCOUNTER — Other Ambulatory Visit: Payer: Self-pay

## 2022-02-23 ENCOUNTER — Encounter: Payer: Self-pay | Admitting: Internal Medicine

## 2022-02-23 ENCOUNTER — Inpatient Hospital Stay: Payer: 59

## 2022-02-23 ENCOUNTER — Inpatient Hospital Stay
Admission: EM | Admit: 2022-02-23 | Discharge: 2022-02-27 | DRG: 917 | Disposition: A | Discharge status: Other Acute Inpatient Hospital | Payer: 59 | Attending: Internal Medicine | Admitting: Internal Medicine

## 2022-02-23 DIAGNOSIS — D75838 Other thrombocytosis: Secondary | ICD-10-CM | POA: Diagnosis present

## 2022-02-23 DIAGNOSIS — K8020 Calculus of gallbladder without cholecystitis without obstruction: Secondary | ICD-10-CM | POA: Diagnosis present

## 2022-02-23 DIAGNOSIS — F32A Depression, unspecified: Secondary | ICD-10-CM | POA: Diagnosis present

## 2022-02-23 DIAGNOSIS — I160 Hypertensive urgency: Secondary | ICD-10-CM | POA: Diagnosis present

## 2022-02-23 DIAGNOSIS — Z86718 Personal history of other venous thrombosis and embolism: Secondary | ICD-10-CM | POA: Diagnosis not present

## 2022-02-23 DIAGNOSIS — T391X2A Poisoning by 4-Aminophenol derivatives, intentional self-harm, initial encounter: Principal | ICD-10-CM | POA: Diagnosis present

## 2022-02-23 DIAGNOSIS — D649 Anemia, unspecified: Secondary | ICD-10-CM | POA: Diagnosis not present

## 2022-02-23 DIAGNOSIS — Z820 Family history of epilepsy and other diseases of the nervous system: Secondary | ICD-10-CM

## 2022-02-23 DIAGNOSIS — R451 Restlessness and agitation: Secondary | ICD-10-CM | POA: Diagnosis present

## 2022-02-23 DIAGNOSIS — J45909 Unspecified asthma, uncomplicated: Secondary | ICD-10-CM | POA: Diagnosis present

## 2022-02-23 DIAGNOSIS — T50901A Poisoning by unspecified drugs, medicaments and biological substances, accidental (unintentional), initial encounter: Secondary | ICD-10-CM | POA: Diagnosis present

## 2022-02-23 DIAGNOSIS — R569 Unspecified convulsions: Secondary | ICD-10-CM | POA: Diagnosis not present

## 2022-02-23 DIAGNOSIS — G40909 Epilepsy, unspecified, not intractable, without status epilepticus: Secondary | ICD-10-CM | POA: Diagnosis present

## 2022-02-23 DIAGNOSIS — I10 Essential (primary) hypertension: Secondary | ICD-10-CM | POA: Diagnosis present

## 2022-02-23 DIAGNOSIS — E1165 Type 2 diabetes mellitus with hyperglycemia: Secondary | ICD-10-CM | POA: Diagnosis present

## 2022-02-23 DIAGNOSIS — Z794 Long term (current) use of insulin: Secondary | ICD-10-CM | POA: Diagnosis not present

## 2022-02-23 DIAGNOSIS — G4733 Obstructive sleep apnea (adult) (pediatric): Secondary | ICD-10-CM | POA: Diagnosis present

## 2022-02-23 DIAGNOSIS — D509 Iron deficiency anemia, unspecified: Secondary | ICD-10-CM | POA: Diagnosis present

## 2022-02-23 DIAGNOSIS — F418 Other specified anxiety disorders: Secondary | ICD-10-CM | POA: Diagnosis present

## 2022-02-23 DIAGNOSIS — Z7951 Long term (current) use of inhaled steroids: Secondary | ICD-10-CM

## 2022-02-23 DIAGNOSIS — J690 Pneumonitis due to inhalation of food and vomit: Secondary | ICD-10-CM | POA: Diagnosis present

## 2022-02-23 DIAGNOSIS — R079 Chest pain, unspecified: Secondary | ICD-10-CM | POA: Diagnosis present

## 2022-02-23 DIAGNOSIS — E669 Obesity, unspecified: Secondary | ICD-10-CM | POA: Diagnosis present

## 2022-02-23 DIAGNOSIS — E876 Hypokalemia: Secondary | ICD-10-CM | POA: Diagnosis not present

## 2022-02-23 DIAGNOSIS — T402X5A Adverse effect of other opioids, initial encounter: Secondary | ICD-10-CM | POA: Diagnosis present

## 2022-02-23 DIAGNOSIS — E8881 Metabolic syndrome: Secondary | ICD-10-CM | POA: Diagnosis present

## 2022-02-23 DIAGNOSIS — R197 Diarrhea, unspecified: Secondary | ICD-10-CM | POA: Diagnosis present

## 2022-02-23 DIAGNOSIS — G8929 Other chronic pain: Secondary | ICD-10-CM | POA: Diagnosis present

## 2022-02-23 DIAGNOSIS — Z79818 Long term (current) use of other agents affecting estrogen receptors and estrogen levels: Secondary | ICD-10-CM

## 2022-02-23 DIAGNOSIS — G8918 Other acute postprocedural pain: Secondary | ICD-10-CM | POA: Diagnosis present

## 2022-02-23 DIAGNOSIS — Z79891 Long term (current) use of opiate analgesic: Secondary | ICD-10-CM

## 2022-02-23 DIAGNOSIS — N200 Calculus of kidney: Secondary | ICD-10-CM | POA: Diagnosis present

## 2022-02-23 DIAGNOSIS — T1491XA Suicide attempt, initial encounter: Secondary | ICD-10-CM | POA: Diagnosis not present

## 2022-02-23 DIAGNOSIS — Z6834 Body mass index (BMI) 34.0-34.9, adult: Secondary | ICD-10-CM

## 2022-02-23 DIAGNOSIS — Z8673 Personal history of transient ischemic attack (TIA), and cerebral infarction without residual deficits: Secondary | ICD-10-CM

## 2022-02-23 DIAGNOSIS — F419 Anxiety disorder, unspecified: Secondary | ICD-10-CM | POA: Diagnosis present

## 2022-02-23 DIAGNOSIS — E119 Type 2 diabetes mellitus without complications: Secondary | ICD-10-CM

## 2022-02-23 DIAGNOSIS — R112 Nausea with vomiting, unspecified: Secondary | ICD-10-CM | POA: Diagnosis present

## 2022-02-23 DIAGNOSIS — Z79899 Other long term (current) drug therapy: Secondary | ICD-10-CM

## 2022-02-23 DIAGNOSIS — Z20822 Contact with and (suspected) exposure to covid-19: Secondary | ICD-10-CM | POA: Diagnosis present

## 2022-02-23 DIAGNOSIS — J9611 Chronic respiratory failure with hypoxia: Secondary | ICD-10-CM | POA: Diagnosis present

## 2022-02-23 DIAGNOSIS — K5900 Constipation, unspecified: Secondary | ICD-10-CM | POA: Diagnosis present

## 2022-02-23 DIAGNOSIS — I82409 Acute embolism and thrombosis of unspecified deep veins of unspecified lower extremity: Secondary | ICD-10-CM | POA: Diagnosis present

## 2022-02-23 DIAGNOSIS — Z888 Allergy status to other drugs, medicaments and biological substances status: Secondary | ICD-10-CM

## 2022-02-23 DIAGNOSIS — E785 Hyperlipidemia, unspecified: Secondary | ICD-10-CM | POA: Diagnosis present

## 2022-02-23 DIAGNOSIS — Z7901 Long term (current) use of anticoagulants: Secondary | ICD-10-CM

## 2022-02-23 DIAGNOSIS — Z91199 Patient's noncompliance with other medical treatment and regimen due to unspecified reason: Secondary | ICD-10-CM

## 2022-02-23 DIAGNOSIS — R1084 Generalized abdominal pain: Secondary | ICD-10-CM | POA: Diagnosis not present

## 2022-02-23 DIAGNOSIS — R109 Unspecified abdominal pain: Secondary | ICD-10-CM | POA: Diagnosis present

## 2022-02-23 DIAGNOSIS — N939 Abnormal uterine and vaginal bleeding, unspecified: Secondary | ICD-10-CM | POA: Diagnosis present

## 2022-02-23 DIAGNOSIS — D72829 Elevated white blood cell count, unspecified: Secondary | ICD-10-CM | POA: Diagnosis present

## 2022-02-23 DIAGNOSIS — I639 Cerebral infarction, unspecified: Secondary | ICD-10-CM | POA: Diagnosis present

## 2022-02-23 LAB — COMPREHENSIVE METABOLIC PANEL
ALT: 12 U/L (ref 0–44)
AST: 16 U/L (ref 15–41)
Albumin: 4.2 g/dL (ref 3.5–5.0)
Alkaline Phosphatase: 76 U/L (ref 38–126)
Anion gap: 10 (ref 5–15)
BUN: 10 mg/dL (ref 6–20)
CO2: 21 mmol/L — ABNORMAL LOW (ref 22–32)
Calcium: 9.2 mg/dL (ref 8.9–10.3)
Chloride: 105 mmol/L (ref 98–111)
Creatinine, Ser: 0.6 mg/dL (ref 0.44–1.00)
GFR, Estimated: >60 mL/min (ref >60)
Glucose, Bld: 311 mg/dL — ABNORMAL HIGH (ref 70–99)
Potassium: 3.8 mmol/L (ref 3.5–5.1)
Sodium: 136 mmol/L (ref 135–145)
Total Bilirubin: 0.5 mg/dL (ref 0.3–1.2)
Total Protein: 7.7 g/dL (ref 6.5–8.1)

## 2022-02-23 LAB — GASTROINTESTINAL PANEL BY PCR, STOOL (REPLACES STOOL CULTURE)

## 2022-02-23 LAB — URINE DRUG SCREEN, QUALITATIVE (ARMC ONLY)
Amphetamines, Ur Screen: NOT DETECTED
Barbiturates, Ur Screen: NOT DETECTED
Benzodiazepine, Ur Scrn: NOT DETECTED
Cannabinoid 50 Ng, Ur ~~LOC~~: NOT DETECTED
Cocaine Metabolite,Ur ~~LOC~~: NOT DETECTED
MDMA (Ecstasy)Ur Screen: NOT DETECTED
Methadone Scn, Ur: NOT DETECTED
Opiate, Ur Screen: POSITIVE — AB
Phencyclidine (PCP) Ur S: NOT DETECTED
Tricyclic, Ur Screen: NOT DETECTED

## 2022-02-23 LAB — URINALYSIS, COMPLETE (UACMP) WITH MICROSCOPIC
Bilirubin Urine: NEGATIVE
Glucose, UA: >=500 mg/dL — AB
Hgb urine dipstick: NEGATIVE
Ketones, ur: 20 mg/dL — AB
Leukocytes,Ua: NEGATIVE
Nitrite: NEGATIVE
Protein, ur: 100 mg/dL — AB
Specific Gravity, Urine: 1.038 — ABNORMAL HIGH (ref 1.005–1.030)
pH: 5 (ref 5.0–8.0)

## 2022-02-23 LAB — HEPATIC FUNCTION PANEL
ALT: 55 U/L — ABNORMAL HIGH (ref 0–44)
AST: 102 U/L — ABNORMAL HIGH (ref 15–41)
Albumin: 3.7 g/dL (ref 3.5–5.0)
Alkaline Phosphatase: 76 U/L (ref 38–126)
Bilirubin, Direct: 0.1 mg/dL (ref 0.0–0.2)
Indirect Bilirubin: 0.5 mg/dL (ref 0.3–0.9)
Total Bilirubin: 0.6 mg/dL (ref 0.3–1.2)
Total Protein: 7.3 g/dL (ref 6.5–8.1)

## 2022-02-23 LAB — CBC WITH DIFFERENTIAL/PLATELET
Abs Immature Granulocytes: 0.05 10*3/uL (ref 0.00–0.07)
Basophils Absolute: 0.1 10*3/uL (ref 0.0–0.1)
Basophils Relative: 1 %
Eosinophils Absolute: 0 10*3/uL (ref 0.0–0.5)
Eosinophils Relative: 0 %
HCT: 35.6 % — ABNORMAL LOW (ref 36.0–46.0)
Hemoglobin: 10.3 g/dL — ABNORMAL LOW (ref 12.0–15.0)
Immature Granulocytes: 0 %
Lymphocytes Relative: 10 %
Lymphs Abs: 1.3 10*3/uL (ref 0.7–4.0)
MCH: 22.6 pg — ABNORMAL LOW (ref 26.0–34.0)
MCHC: 28.9 g/dL — ABNORMAL LOW (ref 30.0–36.0)
MCV: 78.1 fL — ABNORMAL LOW (ref 80.0–100.0)
Monocytes Absolute: 0.7 10*3/uL (ref 0.1–1.0)
Monocytes Relative: 6 %
Neutro Abs: 10.2 10*3/uL — ABNORMAL HIGH (ref 1.7–7.7)
Neutrophils Relative %: 83 %
Platelets: 527 10*3/uL — ABNORMAL HIGH (ref 150–400)
RBC: 4.56 MIL/uL (ref 3.87–5.11)
RDW: 17.1 % — ABNORMAL HIGH (ref 11.5–15.5)
WBC: 12.3 10*3/uL — ABNORMAL HIGH (ref 4.0–10.5)
nRBC: 0 % (ref 0.0–0.2)

## 2022-02-23 LAB — ACETAMINOPHEN LEVEL
Acetaminophen (Tylenol), Serum: 270 ug/mL (ref 10–30)
Acetaminophen (Tylenol), Serum: 168 ug/mL (ref 10–30)
Acetaminophen (Tylenol), Serum: 13 ug/mL (ref 10–30)

## 2022-02-23 LAB — SALICYLATE LEVEL: Salicylate Lvl: <7 mg/dL — ABNORMAL LOW (ref 7.0–30.0)

## 2022-02-23 LAB — C DIFFICILE QUICK SCREEN W PCR REFLEX
C Diff antigen: NEGATIVE
C Diff interpretation: NOT DETECTED
C Diff toxin: NEGATIVE

## 2022-02-23 LAB — LIPASE, BLOOD: Lipase: 28 U/L (ref 11–51)

## 2022-02-23 LAB — PROTIME-INR
INR: 1.4 — ABNORMAL HIGH (ref 0.8–1.2)
Prothrombin Time: 17.4 seconds — ABNORMAL HIGH (ref 11.4–15.2)

## 2022-02-23 LAB — CBG MONITORING, ED
Glucose-Capillary: 395 mg/dL — ABNORMAL HIGH (ref 70–99)
Glucose-Capillary: 257 mg/dL — ABNORMAL HIGH (ref 70–99)
Glucose-Capillary: 246 mg/dL — ABNORMAL HIGH (ref 70–99)
Glucose-Capillary: 186 mg/dL — ABNORMAL HIGH (ref 70–99)

## 2022-02-23 LAB — TROPONIN I (HIGH SENSITIVITY)
Troponin I (High Sensitivity): 6 ng/L (ref <18)
Troponin I (High Sensitivity): 7 ng/L (ref <18)

## 2022-02-23 LAB — APTT: aPTT: 26 seconds (ref 24–36)

## 2022-02-23 MED ORDER — LACOSAMIDE 50 MG PO TABS
100.0000 mg | ORAL_TABLET | Freq: Two times a day (BID) | ORAL | Status: DC
  Administered 2022-02-23 – 2022-02-26 (×7): 100 mg via ORAL
  Filled 2022-02-23 (×9): qty 2

## 2022-02-23 MED ORDER — INSULIN GLARGINE-YFGN 100 UNIT/ML ~~LOC~~ SOLN
60.0000 [IU] | Freq: Every day | SUBCUTANEOUS | Status: DC
  Administered 2022-02-23 – 2022-02-25 (×3): 60 [IU] via SUBCUTANEOUS
  Filled 2022-02-23 (×3): qty 0.6

## 2022-02-23 MED ORDER — MEGESTROL ACETATE 20 MG PO TABS
40.0000 mg | ORAL_TABLET | Freq: Two times a day (BID) | ORAL | Status: DC
  Administered 2022-02-23 – 2022-02-25 (×4): 40 mg via ORAL
  Filled 2022-02-23 (×10): qty 2

## 2022-02-23 MED ORDER — PRAZOSIN HCL 2 MG PO CAPS
4.0000 mg | ORAL_CAPSULE | Freq: Every day | ORAL | Status: DC
  Administered 2022-02-23 – 2022-02-26 (×3): 4 mg via ORAL
  Filled 2022-02-23 (×5): qty 2

## 2022-02-23 MED ORDER — IOHEXOL 300 MG/ML  SOLN
100.0000 mL | Freq: Once | INTRAMUSCULAR | Status: AC | PRN
  Administered 2022-02-23: 100 mL via INTRAVENOUS

## 2022-02-23 MED ORDER — PANTOPRAZOLE SODIUM 40 MG PO TBEC
40.0000 mg | DELAYED_RELEASE_TABLET | Freq: Every day | ORAL | Status: DC
  Administered 2022-02-23 – 2022-02-25 (×3): 40 mg via ORAL
  Filled 2022-02-23 (×4): qty 1

## 2022-02-23 MED ORDER — ACETYLCYSTEINE 20 % IN SOLN
6000.0000 mg | RESPIRATORY_TRACT | Status: DC
  Administered 2022-02-23 – 2022-02-24 (×2): 6000 mg via ORAL
  Filled 2022-02-23 (×3): qty 30

## 2022-02-23 MED ORDER — INSULIN GLARGINE-YFGN 100 UNIT/ML ~~LOC~~ SOLN
110.0000 [IU] | Freq: Every day | SUBCUTANEOUS | Status: DC
Start: 2022-02-23 — End: 2022-02-25
  Administered 2022-02-23 – 2022-02-24 (×2): 110 [IU] via SUBCUTANEOUS
  Filled 2022-02-23 (×3): qty 1.1

## 2022-02-23 MED ORDER — ACETYLCYSTEINE 20 % IN SOLN
70.0000 mg/kg | RESPIRATORY_TRACT | Status: DC
  Administered 2022-02-23 (×3): 6040 mg via ORAL
  Filled 2022-02-23 (×5): qty 60

## 2022-02-23 MED ORDER — VITAMIN B-12 1000 MCG PO TABS
500.0000 ug | ORAL_TABLET | Freq: Every day | ORAL | Status: DC
  Administered 2022-02-23 – 2022-02-25 (×3): 500 ug via ORAL
  Filled 2022-02-23 (×5): qty 1

## 2022-02-23 MED ORDER — HYDRALAZINE HCL 20 MG/ML IJ SOLN
5.0000 mg | INTRAMUSCULAR | Status: DC | PRN
Start: 2022-02-23 — End: 2022-02-27

## 2022-02-23 MED ORDER — RIVAROXABAN 20 MG PO TABS
20.0000 mg | ORAL_TABLET | Freq: Every day | ORAL | Status: DC
  Administered 2022-02-23: 20 mg via ORAL
  Filled 2022-02-23: qty 1

## 2022-02-23 MED ORDER — LORAZEPAM 2 MG/ML IJ SOLN
1.0000 mg | INTRAMUSCULAR | Status: DC | PRN
  Administered 2022-02-24: 1 mg via INTRAVENOUS
  Filled 2022-02-23: qty 1

## 2022-02-23 MED ORDER — ALBUTEROL SULFATE (2.5 MG/3ML) 0.083% IN NEBU: 3.0000 mL | INHALATION_SOLUTION | RESPIRATORY_TRACT | Status: DC | PRN

## 2022-02-23 MED ORDER — PROGESTERONE MICRONIZED 100 MG PO CAPS
200.0000 mg | ORAL_CAPSULE | Freq: Every day | ORAL | Status: DC
  Administered 2022-02-23: 200 mg via ORAL
  Filled 2022-02-23 (×5): qty 2

## 2022-02-23 MED ORDER — DM-GUAIFENESIN ER 30-600 MG PO TB12: 1.0000 | ORAL_TABLET | Freq: Two times a day (BID) | ORAL | Status: DC | PRN

## 2022-02-23 MED ORDER — INSULIN ASPART 100 UNIT/ML IJ SOLN
0.0000 [IU] | Freq: Every day | INTRAMUSCULAR | Status: DC
  Administered 2022-02-24: 3 [IU] via SUBCUTANEOUS
  Filled 2022-02-23: qty 1

## 2022-02-23 MED ORDER — FERROUS SULFATE 325 (65 FE) MG PO TABS
325.0000 mg | ORAL_TABLET | Freq: Two times a day (BID) | ORAL | Status: DC
Start: 2022-02-23 — End: 2022-02-27
  Administered 2022-02-23 – 2022-02-25 (×4): 325 mg via ORAL
  Filled 2022-02-23 (×5): qty 1

## 2022-02-23 MED ORDER — MUSCLE RUB 10-15 % EX CREA
1.0000 | TOPICAL_CREAM | CUTANEOUS | Status: DC | PRN
Start: 2022-02-23 — End: 2022-02-27

## 2022-02-23 MED ORDER — INSULIN ASPART 100 UNIT/ML IJ SOLN
0.0000 [IU] | Freq: Three times a day (TID) | INTRAMUSCULAR | Status: DC
  Administered 2022-02-23: 3 [IU] via SUBCUTANEOUS
  Administered 2022-02-23: 5 [IU] via SUBCUTANEOUS
  Administered 2022-02-23: 9 [IU] via SUBCUTANEOUS
  Administered 2022-02-24: 5 [IU] via SUBCUTANEOUS
  Administered 2022-02-24: 3 [IU] via SUBCUTANEOUS
  Administered 2022-02-24: 5 [IU] via SUBCUTANEOUS
  Administered 2022-02-25: 2 [IU] via SUBCUTANEOUS
  Administered 2022-02-25: 3 [IU] via SUBCUTANEOUS
  Administered 2022-02-27: 7 [IU] via SUBCUTANEOUS
  Filled 2022-02-23 (×10): qty 1

## 2022-02-23 MED ORDER — FOLIC ACID 1 MG PO TABS
1.0000 mg | ORAL_TABLET | Freq: Every day | ORAL | Status: DC
  Administered 2022-02-23 – 2022-02-26 (×4): 1 mg via ORAL
  Filled 2022-02-23 (×5): qty 1

## 2022-02-23 MED ORDER — DIPHENHYDRAMINE HCL 50 MG/ML IJ SOLN
12.5000 mg | Freq: Three times a day (TID) | INTRAMUSCULAR | Status: DC | PRN
  Filled 2022-02-23: qty 1

## 2022-02-23 MED ORDER — AMLODIPINE BESYLATE 5 MG PO TABS
5.0000 mg | ORAL_TABLET | Freq: Every day | ORAL | Status: DC
  Administered 2022-02-23 – 2022-02-27 (×3): 5 mg via ORAL
  Filled 2022-02-23 (×6): qty 1

## 2022-02-23 MED ORDER — EZETIMIBE 10 MG PO TABS
10.0000 mg | ORAL_TABLET | Freq: Every day | ORAL | Status: DC
  Administered 2022-02-23 – 2022-02-25 (×3): 10 mg via ORAL
  Filled 2022-02-23 (×5): qty 1

## 2022-02-23 MED ORDER — IBUPROFEN 200 MG PO TABS: 200.0000 mg | ORAL_TABLET | Freq: Three times a day (TID) | ORAL | Status: DC | PRN

## 2022-02-23 MED ORDER — LACTULOSE 10 GM/15ML PO SOLN: 20.0000 g | Freq: Every day | ORAL | Status: DC | PRN

## 2022-02-23 MED ORDER — LEVETIRACETAM 500 MG PO TABS
1000.0000 mg | ORAL_TABLET | Freq: Two times a day (BID) | ORAL | Status: DC
  Administered 2022-02-23 – 2022-02-26 (×8): 1000 mg via ORAL
  Filled 2022-02-23 (×9): qty 2

## 2022-02-23 MED ORDER — PREGABALIN 75 MG PO CAPS
75.0000 mg | ORAL_CAPSULE | Freq: Three times a day (TID) | ORAL | Status: DC
  Administered 2022-02-23 – 2022-02-26 (×11): 75 mg via ORAL
  Filled 2022-02-23 (×12): qty 1

## 2022-02-23 MED ORDER — ACETYLCYSTEINE 20 % IN SOLN
140.0000 mg/kg | Freq: Once | RESPIRATORY_TRACT | Status: AC
  Administered 2022-02-23: 12060 mg via ORAL
  Filled 2022-02-23: qty 90

## 2022-02-23 NOTE — Assessment & Plan Note (Signed)
Continue Zetia. °

## 2022-02-23 NOTE — Assessment & Plan Note (Signed)
Hemoglobin stable 10.3 -Continue iron supplement

## 2022-02-23 NOTE — H&P (Addendum)
History and Physical    Lauren Velazquez [id number] DOB: 09-14-1960 DOA: 02/23/2022  Referring MD/NP/PA:   PCP: Denton Lank, MD   Patient coming from:  The patient is coming from home.  At baseline, pt is independent for most of ADL.        Chief Complaint: overdose  HPI: Lauren Velazquez is a 61 y.o. female with medical history significant of depression with anxiety, hypertension, hyperlipidemia, diabetes mellitus, asthma, anemia, vertigo, obesity with BMI 34.75, seizure, DVT on Xarelto, OSA, chronic pain, vaginal bleeding, who presents with overdose.  Pt was reportedly intentionally overdosed Tylenol and Effexor. Patient states that she has been feeling suicidal for the past few days. Per report, patient counted out 45 325 mg Tylenol, 4 Effexor XR 75 mg and handful of oxycodone and took them all around 2:30 and attempted suicide.  When I saw patient in the emergency room, she denies suicidal or homicidal ideations currently.  She states that she has nausea, vomiting, diarrhea and abdominal pain.  She states that she had several episodes of nonbilious nonbloody vomiting.  She also had several episode of watery diarrhea.  She states that she has mild, aching central abdominal pain.  She states that she has frontal chest pain which is mild to moderate, sharp, nonradiating.  Actually patient has front chest wall tenderness on palpation.  Denies cough, shortness breath.  No symptoms of UTI.  Data reviewed independently and ED Course: pt was found to have Tylenol level 270 --> 168, INR 1.4, PTT 26, salicylate level less than 7, GFR> 60, temperature normal, blood pressure 163/79, 180/74, heart rate 85, RR 18, oxygen saturation 97% on room air.  Patient is admitted to progressive unit as inpatient.  Started NAC infusion in ED.  Patient is IVC'ed. Consulted psychiatry, dr. Weber Cooks    EKG: I have personally reviewed.  Sinus rhythm, QTc 500, Q wave in lead III, low voltage, poor R wave  progression.   CT-abd/pelvis: Small nonobstructing LEFT renal calculus.   Probable 2 cm leiomyoma within uterus.   Peripherally calcified gallbladder versus large calcified gallstone within gallbladder.   Tiny umbilical hernia containing fat.   Grossly stable simple appearing LEFT ovarian cyst 5.1 x 4.6 cm; follow-up ultrasound recommended in 6-12 months.   No acute intra-abdominal or intrapelvic abnormalities.   Aortic Atherosclerosis (ICD10-I70.0).     Review of Systems:   General: no fevers, chills, no body weight gain, has poor appetite, has fatigue HEENT: no blurry vision, hearing changes or sore throat Respiratory: no dyspnea, coughing, wheezing CV: has chest wall pain, no palpitations GI: has nausea, vomiting, abdominal pain, diarrhea, no constipation GU: no dysuria, burning on urination, increased urinary frequency, hematuria  Ext: no leg edema Neuro: no unilateral weakness, numbness, or tingling, no vision change or hearing loss Skin: no rash, no skin tear. MSK: No muscle spasm, no deformity, no limitation of range of movement in spin Heme: No easy bruising.  Travel history: No recent long distant travel. Psychiatry: Has overdose, had attempt   Allergy:  Allergies  Allergen Reactions   Atorvastatin Swelling    Past Medical History:  Diagnosis Date   Anemia    not currently under treatment   Anxiety    Asthma    during allergy season   Diabetes mellitus without complication ([place])    Hypertension    Vertigo     Past Surgical History:  Procedure Laterality Date   CARPAL TUNNEL RELEASE Right 2015   KNEE ARTHROSCOPY Left 05/17/2018  Procedure: ARTHROSCOPY KNEE WITH LATERAL RELEASE, PARTIAL SYNOVECTOMY;  Surgeon: Kennedy Bucker, MD;  Location: [facility] ORS;  Service: Orthopedics;  Laterality: Left;   KNEE ARTHROSCOPY WITH LATERAL RELEASE Right 12/28/2017   Procedure: KNEE ARTHROSCOPY WITH LATERAL RELEASE;  Surgeon: Kennedy Bucker, MD;  Location: [facility] ORS;   Service: Orthopedics;  Laterality: Right;   TIBIAL TUBERCLERPLASTY Bilateral 06/01/2017   Procedure: TIBIAL TUBERCLE SPUR EXCISION;  Surgeon: Kennedy Bucker, MD;  Location: [facility] OR[facility];  Service: Orthopedics;  Laterality: Bilateral;    Social History:  reports that she has never smoked. She has never used smokeless tobacco. She reports that she does not drink alcohol and does not use drugs.  Family History:  Family History  Problem Relation Age of Onset   Seizures Sister    Seizures Brother      Prior to Admission medications   Medication Sig Start Date End Date Taking? Authorizing Provider  albuterol (VENTOLIN HFA) 108 (90 Base) MCG/ACT inhaler Inhale 2 puffs into the lungs every 6 (six) hours as needed. 05/08/19   [provider]  amLODipine (NORVASC) 5 MG tablet Take 1 tablet (5 mg total) by mouth daily. 07/20/21   Osvaldo Shipper, MD  ezetimibe (ZETIA) 10 MG tablet Take 1 tablet (10 mg total) by mouth daily. 07/20/21   Osvaldo Shipper, MD  ferrous sulfate 325 (65 FE) MG tablet Take 1 tablet (325 mg total) by mouth 2 (two) times daily with a meal. 07/20/21   Osvaldo Shipper, MD  folic acid (FOLVITE) 1 MG tablet Take 1 tablet (1 mg total) by mouth daily. 07/20/21   Osvaldo Shipper, MD  insulin aspart (NOVOLOG) 100 UNIT/ML injection Inject 12 Units into the skin 3 (three) times daily with meals. Patient taking differently: Inject 20 Units into the skin 3 (three) times daily with meals. 07/20/21   Osvaldo Shipper, MD  insulin aspart (NOVOLOG) 100 UNIT/ML injection Use as per following sliding scale: CBG 70 - 120: 0 units CBG 121 - 150: 3 units CBG 151 - 200: 4 units CBG 201 - 250: 7 units CBG 251 - 300: 11 units CBG 301 - 350: 15 units CBG 351 - 400: 20 units and call MD/APP for further instructions 07/20/21   Osvaldo Shipper, MD  insulin glargine-yfgn (SEMGLEE) 100 UNIT/ML injection Inject 180 Units into the skin 2 (two) times daily.    [provider]  Lacosamide 100 MG TABS Take 1  tablet (100 mg total) by mouth 2 (two) times daily. 07/20/21   Osvaldo Shipper, MD  lactulose (CHRONULAC) 10 GM/15ML solution Take 30 mLs (20 g total) by mouth daily as needed for mild constipation. 02/09/22   Irean Hong, MD  levETIRAcetam (KEPPRA) 1000 MG tablet Take 1 tablet (1,000 mg total) by mouth 2 (two) times daily. 07/20/21   Osvaldo Shipper, MD  lisinopril (ZESTRIL) 10 MG tablet Take 1 tablet (10 mg total) by mouth daily. 07/20/21   Osvaldo Shipper, MD  megestrol (MEGACE) 40 MG tablet Take 1 tablet (40 mg total) by mouth 2 (two) times daily. 07/20/21   Osvaldo Shipper, MD  Menthol-Methyl Salicylate (MUSCLE RUB) 10-15 % CREA Apply 1 application topically as needed for muscle pain. 07/20/21   Osvaldo Shipper, MD  ondansetron (ZOFRAN-ODT) 8 MG disintegrating tablet Take 8 mg by mouth every 8 (eight) hours as needed. 02/17/22   [provider]  oxyCODONE-acetaminophen (PERCOCET) 10-325 MG tablet Take 1 tablet by mouth every 5 (five) hours as needed for pain. 07/20/21   Osvaldo Shipper, MD  polyethylene  glycol (MIRALAX / GLYCOLAX) 17 g packet Take 17 g by mouth 2 (two) times daily. 07/20/21   Bonnielee Haff, MD  prazosin (MINIPRESS) 1 MG capsule Take 4 mg by mouth at bedtime. 12/02/21   [provider]  pregabalin (LYRICA) 75 MG capsule Take 75 mg by mouth 3 (three) times daily. 02/03/22   [provider]  progesterone (PROMETRIUM) 200 MG capsule Take 200 mg by mouth at bedtime. 01/21/22   [provider]  Rivaroxaban (XARELTO) 15 MG TABS tablet Take 1 tablet (15 mg total) by mouth 2 (two) times daily with a meal for 7 days. 07/20/21 07/27/21  Bonnielee Haff, MD  rivaroxaban (XARELTO) 20 MG TABS tablet Take 1 tablet (20 mg total) by mouth daily with supper. STARTING 07/28/21 07/28/21   Bonnielee Haff, MD  senna-docusate (SENOKOT-S) 8.6-50 MG tablet Take 2 tablets by mouth 2 (two) times daily. 07/20/21   Bonnielee Haff, MD  venlafaxine XR (EFFEXOR-XR) 75 MG 24 hr capsule  Take 75 mg by mouth. 75mg  qam and 150mg  qhs 12/01/21   [provider]  vitamin B-12 (CYANOCOBALAMIN) 500 MCG tablet Take 1 tablet (500 mcg total) by mouth daily. 07/20/21   Bonnielee Haff, MD    Physical Exam: Vitals:   02/23/22 0600 02/23/22 0838 02/23/22 1000 02/23/22 1141  BP: (!) 163/79 (!) 180/74 (!) 194/88 (!) 164/85  Pulse: 79 (!) 105 93 78  Resp: 14 11 15 19   Temp:      TempSrc:      SpO2: 97% 98% 98% 97%  Weight:      Height:       General: Not in acute distress HEENT:       Eyes: PERRL, EOMI, no scleral icterus.       ENT: No discharge from the ears and nose, no pharynx injection, no tonsillar enlargement.        Neck: No JVD, no bruit, no mass felt. Heme: No neck lymph node enlargement. Cardiac: S1/S2, RRR, No murmurs, No gallops or rubs. Respiratory: No rales, wheezing, rhonchi or rubs. GI: Soft, nondistended, has central abdominal tenderness, no rebound pain, no organomegaly, BS present. GU: No hematuria Ext: No pitting leg edema bilaterally. 1+DP/PT pulse bilaterally. Musculoskeletal: No joint deformities, No joint redness or warmth, no limitation of ROM in spin. Skin: No rashes.  Neuro: Alert, oriented X3, cranial nerves II-XII grossly intact, moves all extremities normally. Psych: Psychiatry: Has overdose, had attempt, but currently denies suicidal homicidal ideations.  Labs on Admission: I have personally reviewed following labs and imaging studies  CBC: Recent Labs  Lab 02/20/22 0841 02/21/22 0405 02/23/22 0353  WBC 10.6* 13.7* 12.3*  NEUTROABS  --   --  10.2*  HGB 10.6* 10.1* 10.3*  HCT 37.0 34.0* 35.6*  MCV 77.6* 77.1* 78.1*  PLT 449* 470* 123456*   Basic Metabolic Panel: Recent Labs  Lab 02/20/22 0841 02/21/22 0405 02/23/22 0353  NA 140 140 136  K 3.5 3.7 3.8  CL 106 109 105  CO2 25 22 21*  GLUCOSE 113* 171* 311*  BUN 10 6 10   CREATININE 0.63 0.54 0.60  CALCIUM 9.3 9.2 9.2   GFR: Estimated Creatinine Clearance: 78 mL/min (by  C-G formula based on SCr of 0.6 mg/dL). Liver Function Tests: Recent Labs  Lab 02/21/22 0405 02/23/22 0353 02/23/22 0808  AST 15 16 102*  ALT 16 12 55*  ALKPHOS 71 76 76  BILITOT 0.8 0.5 0.6  PROT 7.6 7.7 7.3  ALBUMIN 4.1 4.2 3.7  Recent Labs  Lab 02/20/22 0841 02/23/22 0808  LIPASE 21 28   No results for input(s): "AMMONIA" in the last 168 hours. Coagulation Profile: Recent Labs  Lab 02/23/22 0353  INR 1.4*   Cardiac Enzymes: No results for input(s): "CKTOTAL", "CKMB", "CKMBINDEX", "TROPONINI" in the last 168 hours. BNP (last 3 results) No results for input(s): "PROBNP" in the last 8760 hours. HbA1C: Recent Labs    02/21/22 0405  HGBA1C 6.7*   CBG: Recent Labs  Lab 02/21/22 1145 02/21/22 1445 02/21/22 1648 02/23/22 0806 02/23/22 1140  GLUCAP 195* 252* 208* 395* 257*   Lipid Profile: No results for input(s): "CHOL", "HDL", "LDLCALC", "TRIG", "CHOLHDL", "LDLDIRECT" in the last 72 hours. Thyroid Function Tests: No results for input(s): "TSH", "T4TOTAL", "FREET4", "T3FREE", "THYROIDAB" in the last 72 hours. Anemia Panel: No results for input(s): "VITAMINB12", "FOLATE", "FERRITIN", "TIBC", "IRON", "RETICCTPCT" in the last 72 hours. Urine analysis:    Component Value Date/Time   COLORURINE YELLOW (A) 02/23/2022 0701   APPEARANCEUR HAZY (A) 02/23/2022 0701   APPEARANCEUR Cloudy 11/19/2014 2137   LABSPEC 1.038 (H) 02/23/2022 0701   LABSPEC 1.026 11/19/2014 2137   PHURINE 5.0 02/23/2022 0701   GLUCOSEU >=500 (A) 02/23/2022 0701   GLUCOSEU Negative 11/19/2014 2137   HGBUR NEGATIVE 02/23/2022 0701   BILIRUBINUR NEGATIVE 02/23/2022 0701   BILIRUBINUR Negative 11/19/2014 2137   KETONESUR 20 (A) 02/23/2022 0701   PROTEINUR 100 (A) 02/23/2022 0701   NITRITE NEGATIVE 02/23/2022 0701   LEUKOCYTESUR NEGATIVE 02/23/2022 0701   LEUKOCYTESUR 3+ 11/19/2014 2137   Sepsis Labs: @LABRCNTIP (procalcitonin:4,lacticidven:4) ) Recent Results (from the past 240 hour(s))   Resp Panel by RT-PCR (Flu A&B, Covid) Anterior Nasal Swab     Status: None   Collection Time: 02/20/22 11:20 AM   Specimen: Anterior Nasal Swab  Result Value Ref Range Status   SARS Coronavirus 2 by RT PCR NEGATIVE NEGATIVE Final    Comment: (NOTE) SARS-CoV-2 target nucleic acids are NOT DETECTED.  The SARS-CoV-2 RNA is generally detectable in upper respiratory specimens during the acute phase of infection. The lowest concentration of SARS-CoV-2 viral copies this assay can detect is 138 copies/mL. A negative result does not preclude SARS-Cov-2 infection and should not be used as the sole basis for treatment or other patient management decisions. A negative result may occur with  improper specimen collection/handling, submission of specimen other than nasopharyngeal swab, presence of viral mutation(s) within the areas targeted by this assay, and inadequate number of viral copies(<138 copies/mL). A negative result must be combined with clinical observations, patient history, and epidemiological information. The expected result is Negative.  Fact Sheet for Patients:  EntrepreneurPulse.com.au  Fact Sheet for Healthcare Providers:  IncredibleEmployment.be  This test is no t yet approved or cleared by the [place] FDA and  has been authorized for detection and/or diagnosis of SARS-CoV-2 by FDA under an Emergency Use Authorization (EUA). This EUA will remain  in effect (meaning this test can be used) for the duration of the COVID-19 declaration under Section 564(b)(1) of the Act, 21 U.S.C.section 360bbb-3(b)(1), unless the authorization is terminated  or revoked sooner.       Influenza A by PCR NEGATIVE NEGATIVE Final   Influenza B by PCR NEGATIVE NEGATIVE Final    Comment: (NOTE) The Xpert Xpress SARS-CoV-2/FLU/RSV plus assay is intended as an aid in the diagnosis of influenza from Nasopharyngeal swab specimens and should not be used as a  sole basis for treatment. Nasal washings and aspirates are unacceptable for Xpert  Xpress SARS-CoV-2/FLU/RSV testing.  Fact Sheet for Patients: BloggerCourse.com  Fact Sheet for Healthcare Providers: SeriousBroker.it  This test is not yet approved or cleared by the [place] FDA and has been authorized for detection and/or diagnosis of SARS-CoV-2 by FDA under an Emergency Use Authorization (EUA). This EUA will remain in effect (meaning this test can be used) for the duration of the COVID-19 declaration under Section 564(b)(1) of the Act, 21 U.S.C. section 360bbb-3(b)(1), unless the authorization is terminated or revoked.  Performed at [facility], [address] [place], [place] [address]      Radiological Exams on Admission: CT ABDOMEN PELVIS W CONTRAST  Result Date: 02/23/2022 CLINICAL DATA:  Abdominal pain EXAM: CT ABDOMEN AND PELVIS WITH CONTRAST TECHNIQUE: Multidetector CT imaging of the abdomen and pelvis was performed using the standard protocol following bolus administration of intravenous contrast. RADIATION DOSE REDUCTION: This exam was performed according to the departmental dose-optimization program which includes automated exposure control, adjustment of the mA and/or kV according to patient size and/or use of iterative reconstruction technique. CONTRAST:  OMNIPAQUE IOHEXOL 300 MG/ML SOLN IV. No oral contrast. COMPARISON:  02/20/2022 FINDINGS: Lower chest: Minimal bibasilar atelectasis. Hepatobiliary: Peripherally calcified gallbladder versus large calcified gallstone within gallbladder. Liver unremarkable. Pancreas: Atrophic pancreas without mass Spleen: Normal appearance.  Small adjacent splenule. Adrenals/Urinary Tract: Small nonobstructing LEFT renal calculus. Small exophytic RIGHT renal cyst 16 mm diameter unchanged; no follow-up imaging recommended adrenal glands, kidneys, ureters, and bladder otherwise  normal appearance. Stomach/Bowel: Normal appendix. Stomach and bowel loops normal appearance Vascular/Lymphatic: Atherosclerotic calcifications aorta without aneurysm. No adenopathy. Reproductive: Probable 2 cm leiomyoma within uterus upper LEFT. LEFT ovarian cyst 5.1 x 4.6 cm not significantly changed; this is a simple appearing cyst and follow-up ultrasound is recommended in 6-12 months. RIGHT adnexa unremarkable. Other: Tiny umbilical hernia containing fat. No free air or free fluid. No inflammatory process. Musculoskeletal: Unremarkable IMPRESSION: Small nonobstructing LEFT renal calculus. Probable 2 cm leiomyoma within uterus. Peripherally calcified gallbladder versus large calcified gallstone within gallbladder. Tiny umbilical hernia containing fat. Grossly stable simple appearing LEFT ovarian cyst 5.1 x 4.6 cm; follow-up ultrasound recommended in 6-12 months. No acute intra-abdominal or intrapelvic abnormalities. Aortic Atherosclerosis (ICD10-I70.0). Electronically Signed   By: Ulyses Southward M.D.   On: 02/23/2022 10:08      Assessment/Plan Principal Problem:   Acetaminophen overdose, intentional self-harm, initial encounter [facility]) Active Problems:   Suicidal behavior with attempted self-injury (HCC)   Depression with anxiety   Abdominal pain   Nausea vomiting and diarrhea   DVT (deep venous thrombosis) (HCC)   Diabetes mellitus without complication (HCC)   Hyperlipidemia   Hypertension   Leukocytosis   Normocytic anemia   Seizure (HCC)   Cerebrovascular accident (CVA) (HCC)   Obesity with body mass index of 30.0-39.9   Chest pain   Assessment and Plan: * Acetaminophen overdose, intentional self-harm, initial encounter (HCC) Her Tylenol level is 270 --> 168. NAC infusion is started in ED. LFT with ALP 76, AST 102, ALT 55, total bilirubin 0.6.  INR 1.4, PTT 26.  Initial QTc interval 500 --> 439, repeated EKG.   -Will admit to PCU as inpt -Started on N-acetylcysteine per pharm -  seizure precaution -Continue neuro checks, monitor for worsening abdominal pain, nausea, vomiting, tinnitus, lays, pallor, lethargy or diaphoresis. -Check repeat Tylenol level - CMP in AM -Supportive care -consulted Dr. Toni Amend of psychiatry -Monitoring QTc interval      Suicidal behavior with attempted self-injury [facility]) Patient denies  suicidal or homicidal ideations currently -Consulted psychiatry, Dr. Toni Amend  Depression with anxiety -hold Effexor since patient also overdosed on Effexor.  Abdominal pain Etiology is not clear.  CT abdomen/pelvis is negative for acute findings except for showing a small non-obstructive left kidney stone and chronic gallstone.  Maybe due to Tylenol overdose -Check lipase -Start Protonix empirically -check UA  Nausea vomiting and diarrhea -prn Benadryl IV -Check C. difficile and GI pathogen panel  DVT (deep venous thrombosis) (HCC) -continue Xarelto  Diabetes mellitus without complication (HCC) Recent A1c 6.7, well controlled.  Patient is  taking NovoLog and glargine insulin 80 units in the morning and 140 unit in the evening -Sliding scale insulin -Glargine insulin 60 units in the morning and 110 units in the evening  Hyperlipidemia -Zetia  Hypertension - IV hydralazine as needed -Amlodipine -Patient is also on prazosin  Leukocytosis WBC 12.3, no source of infection identified -Check urinalysis  Normocytic anemia Hemoglobin stable 10.3 -Continue iron supplement  Seizure (HCC) - Seizure precaution -As needed Ativan for seizure -Continue home Keppra 1000 mg twice daily -Continue home lacosamide 100 mg twice daily  Cerebrovascular accident (CVA) (HCC) - Continue Zetia  Obesity with body mass index of 30.0-39.9  BMI= 34.75  and BW= 86kg -Diet and exercise.   -Encourage to lose weight.   Chest pain Her chest pain seem to be musculoskeletal since patient has tenderness in front chest wall, may be due to chronic pain  syndrome.  Low suspicions for PE since patient is on Xarelto. -Trend troponin -As needed ibuprofen          DVT ppx: on Xarelto  Code Status: Full code  Family Communication: I called her daughter by phone.   Disposition Plan:  Anticipate discharge back to previous environment  Consults called: Consulted psychiatry, dr. Toni Amend  Admission status and Level of care: Progressive:   as inpt     Severity of Illness:  The appropriate patient status for this patient is INPATIENT. Inpatient status is judged to be reasonable and necessary in order to provide the required intensity of service to ensure the patient's safety. The patient's presenting symptoms, physical exam findings, and initial radiographic and laboratory data in the context of their chronic comorbidities is felt to place them at high risk for further clinical deterioration. Furthermore, it is not anticipated that the patient will be medically stable for discharge from the hospital within 2 midnights of admission.   * I certify that at the point of admission it is my clinical judgment that the patient will require inpatient hospital care spanning beyond 2 midnights from the point of admission due to high intensity of service, high risk for further deterioration and high frequency of surveillance required.*       Date of Service 02/23/2022    Lorretta Harp [facility]   If 7PM-7AM, please contact night-coverage [email or web address] 02/23/2022, 12:36 PM

## 2022-02-23 NOTE — Assessment & Plan Note (Addendum)
Her Tylenol level is 270 --> 168. NAC infusion is started in ED. LFT with ALP 76, AST 102, ALT 55, total bilirubin 0.6.  INR 1.4, PTT 26.  Initial QTc interval 500 --> 439, repeated EKG.   -Will admit to PCU as inpt -Started on N-acetylcysteine per pharm - seizure precaution -Continue neuro checks, monitor for worsening abdominal pain, nausea, vomiting, tinnitus, lays, pallor, lethargy or diaphoresis. -Check repeat Tylenol level - CMP in AM -Supportive care -consulted Dr. Toni Amend of psychiatry -Monitoring QTc interval

## 2022-02-23 NOTE — Assessment & Plan Note (Signed)
-   Seizure precaution -As needed Ativan for seizure -Continue home Keppra 1000 mg twice daily -Continue home lacosamide 100 mg twice daily

## 2022-02-23 NOTE — Assessment & Plan Note (Signed)
Etiology is not clear.  CT abdomen/pelvis is negative for acute findings except for showing a small non-obstructive left kidney stone and chronic gallstone.  Maybe due to Tylenol overdose -Check lipase -Start Protonix empirically -check UA

## 2022-02-23 NOTE — Assessment & Plan Note (Signed)
-  prn Benadryl IV -Check C. difficile and GI pathogen panel

## 2022-02-23 NOTE — ED Provider Notes (Signed)
[facility] Provider Note    Event Date/Time   First MD Initiated Contact with Patient 02/23/22 [phone]     (approximate)   History   Ingestion   HPI  Lauren Velazquez is a 61 y.o. female   with a history of anxiety hypertension diabetes presents to the ER after intentional overdose.  Patient states that she has been feeling suicidal for the past few days.  Per report patient counted out 45 325 mg Tylenol, 4 Effexor XR 75 mg and handful of oxycodone and took them all around 230 and attempted suicide.  Denies any nausea or vomiting.  No headaches.  Denies any discomfort at this time.      Physical Exam   Triage Vital Signs: ED Triage Vitals  Enc Vitals Group     BP 02/23/22 0348 (!) 198/85     Pulse Rate 02/23/22 0348 83     Resp 02/23/22 0348 18     Temp 02/23/22 0400 98.1 F (36.7 C)     Temp Source 02/23/22 0400 Oral     SpO2 02/23/22 0346 96 %     Weight 02/23/22 0349 190 lb (86.2 kg)     Height 02/23/22 0349 5\' 2"  (1.575 m)     Head Circumference --      Peak Flow --      Pain Score 02/23/22 0348 0     Pain Loc --      Pain Edu? --      Excl. in GC? --     Most recent vital signs: Vitals:   02/23/22 0530 02/23/22 0600  BP: (!) 156/88 (!) 163/79  Pulse: 73 79  Resp: 13 14  Temp:    SpO2: 96% 97%     Constitutional: Alert  Eyes: Conjunctivae are normal.  Head: Atraumatic. Nose: No congestion/rhinnorhea. Mouth/Throat: Mucous membranes are moist.   Neck: Painless ROM.  Cardiovascular:   Good peripheral circulation. Respiratory: Normal respiratory effort.  No retractions.  Gastrointestinal: Soft and nontender.  Musculoskeletal:  no deformity Neurologic:  MAE spontaneously. No gross focal neurologic deficits are appreciated.  Skin:  Skin is warm, dry and intact. No rash noted. Psychiatric: Mood and affect are normal. Speech and behavior are normal.    ED Results / Procedures / Treatments   Labs (all labs ordered are listed,  but only abnormal results are displayed) Labs Reviewed  CBC WITH DIFFERENTIAL/PLATELET - Abnormal; Notable for the following components:      Result Value   WBC 12.3 (*)    Hemoglobin 10.3 (*)    HCT 35.6 (*)    MCV 78.1 (*)    MCH 22.6 (*)    MCHC 28.9 (*)    RDW 17.1 (*)    Platelets 527 (*)    Neutro Abs 10.2 (*)    All other components within normal limits  COMPREHENSIVE METABOLIC PANEL - Abnormal; Notable for the following components:   CO2 21 (*)    Glucose, Bld 311 (*)    All other components within normal limits  PROTIME-INR - Abnormal; Notable for the following components:   Prothrombin Time 17.4 (*)    INR 1.4 (*)    All other components within normal limits  ACETAMINOPHEN LEVEL - Abnormal; Notable for the following components:   Acetaminophen (Tylenol), Serum 270 (*)    All other components within normal limits  SALICYLATE LEVEL - Abnormal; Notable for the following components:   Salicylate Lvl <7.0 (*)    All  other components within normal limits  URINE DRUG SCREEN, QUALITATIVE ([facility] ONLY)     EKG  ED ECG REPORT I, Willy Eddy, the attending physician, personally viewed and interpreted this ECG.   Date: 02/23/2022  EKG Time: 3:49  Rate: 90  Rhythm: sinus  Axis: normal  Intervals: normal  ST&T Change: no stemi    RADIOLOGY Please see ED Course for my review and interpretation.  I personally reviewed all radiographic images ordered to evaluate for the above acute complaints and reviewed radiology reports and findings.  These findings were personally discussed with the patient.  Please see medical record for radiology report.    PROCEDURES:  Critical Care performed: Yes, see critical care procedure note(s)  .Critical Care  Performed by: Willy Eddy, MD Authorized by: Willy Eddy, MD   Critical care provider statement:    Critical care time (minutes):  40   Critical care was necessary to treat or prevent imminent or  life-threatening deterioration of the following conditions:  Toxidrome   Critical care was time spent personally by me on the following activities:  Ordering and performing treatments and interventions, ordering and review of laboratory studies, ordering and review of radiographic studies, pulse oximetry, re-evaluation of patient's condition, review of old charts, obtaining history from patient or surrogate, examination of patient, evaluation of patient's response to treatment, discussions with primary provider, discussions with consultants and development of treatment plan with patient or surrogate    MEDICATIONS ORDERED IN ED: Medications  acetylcysteine (MUCOMYST) 20 % nebulizer / oral solution 12,060 mg (has no administration in time range)    Followed by  acetylcysteine (MUCOMYST) 20 % nebulizer / oral solution 6,040 mg (has no administration in time range)     IMPRESSION / MDM / ASSESSMENT AND PLAN / ED COURSE  I reviewed the triage vital signs and the nursing notes.                              Differential diagnosis includes, but is not limited to, toxic ingestion, overdose, suicidal ideation, medication reaction  Patient presents to the ER for evaluation of intentional overdose on Tylenol and Effexor as described above she is protecting her airway but slightly drowsy.  This presenting complaint could reflect a potentially life-threatening illness therefore the patient will be placed on continuous pulse oximetry and telemetry for monitoring.  Laboratory evaluation will be sent to evaluate for the above complaints.  Her EKG shows borderline prolonged QT.  Have consulted with poison control center regarding suspected toxic Tylenol ingestion.  Have recommended 4-hour Tylenol level.  Will require 24 hour telemetry monitoring given Effexor ingestion.  Patient placed under IVC due to intentional OD.     Clinical Course as of 02/23/22 0606  Wed Feb 23, 2022  0605 Patient's Tylenol level  did come back significantly elevated.  She is not having any vomiting at this time remains hemodynamically stable but given her report of 45 Tylenol tablets ingested this is well over the toxic ingestion threshold.  I consulted with poison control again and they agreed with my plan to initiate NAC regardless of the 4-hour level.  Will consult with hospitalist for admission. [PR]    Clinical Course User Index [PR] Willy Eddy, MD     FINAL CLINICAL IMPRESSION(S) / ED DIAGNOSES   Final diagnoses:  Acetaminophen overdose, intentional self-harm, initial encounter [facility])     Rx / DC Orders   ED Discharge Orders  None        Note:  This document was prepared using Dragon voice recognition software and may include unintentional dictation errors.    Willy Eddy, MD 02/23/22 [phone]

## 2022-02-23 NOTE — Consult Note (Signed)
[facility] Face-to-Face Psychiatry Consult   Reason for Consult: Consult for 61 year old woman came into the hospital after taking an overdose of acetaminophen and other medicine Referring Physician: Tamala Julian Patient Identification: Lauren Velazquez MRN:  [id number] Principal Diagnosis: Acetaminophen overdose, intentional self-harm, initial encounter [facility]) Diagnosis:  Principal Problem:   Acetaminophen overdose, intentional self-harm, initial encounter [facility]) Active Problems:   Hyperlipidemia   Hypertension   Diabetes mellitus without complication ([place])   Seizure ([place])   Cerebrovascular accident (CVA) ([place])   DVT (deep venous thrombosis) ([place])   Suicidal behavior with attempted self-injury ([place])   Depression with anxiety   Leukocytosis   Normocytic anemia   Obesity with body mass index of 30.0-39.9   Nausea vomiting and diarrhea   Abdominal pain   Chest pain   Total Time spent with patient: 45 minutes  Subjective:   Lauren Velazquez is a 61 y.o. female patient admitted with "I was in a bad place".  HPI: Patient seen and chart reviewed.  61 year old woman came to the hospital reporting that she had taken an intentional overdose of a large amount of acetaminophen as well as venlafaxine and possibly oxycodone.  Patient had reported taking the pills within a few hours before coming to the emergency room.  On interview the patient was awake but a little sedated.  Made no eye contact.  Only engaged passively in conversation.  She would only tell me that she had been in "a bad place" recently.  Could not really describe why she had been feeling that way or give me much in the way of the details.  She admits that she took the large overdose of medicine and said she had been thinking about it for about a day.  She claims that she had not wanted to die but had wanted "something to change".  Denies any hallucinations.  Talks about being anxious about her husband who is at home by himself and is medically  impaired.  Past Psychiatric History: Patient has been seen previously for altered mental status possibly related to previous overdoses.  Last time she was in the hospital it was never really clear whether it had been an overdose of medicine or not.  No previous known inpatient treatment.  Patient has chronic pain issues and is on large amounts of controlled substance  Risk to Self:   Risk to Others:   Prior Inpatient Therapy:   Prior Outpatient Therapy:    Past Medical History:  Past Medical History:  Diagnosis Date   Anemia    not currently under treatment   Anxiety    Asthma    during allergy season   Diabetes mellitus without complication ([place])    Hypertension    Vertigo     Past Surgical History:  Procedure Laterality Date   CARPAL TUNNEL RELEASE Right 2015   KNEE ARTHROSCOPY Left 05/17/2018   Procedure: ARTHROSCOPY KNEE WITH LATERAL RELEASE, PARTIAL SYNOVECTOMY;  Surgeon: Hessie Knows, MD;  Location: [facility] ORS;  Service: Orthopedics;  Laterality: Left;   KNEE ARTHROSCOPY WITH LATERAL RELEASE Right 12/28/2017   Procedure: KNEE ARTHROSCOPY WITH LATERAL RELEASE;  Surgeon: Hessie Knows, MD;  Location: [facility] ORS;  Service: Orthopedics;  Laterality: Right;   TIBIAL TUBERCLERPLASTY Bilateral 06/01/2017   Procedure: TIBIAL TUBERCLE SPUR EXCISION;  Surgeon: Hessie Knows, MD;  Location: [facility] ORS;  Service: Orthopedics;  Laterality: Bilateral;   Family History:  Family History  Problem Relation Age of Onset   Seizures Sister    Seizures Brother  Family Psychiatric  History: See previous Social History:  Social History   Substance and Sexual Activity  Alcohol Use No     Social History   Substance and Sexual Activity  Drug Use No    Social History   Socioeconomic History   Marital status: Married    Spouse name: Not on file   Number of children: Not on file   Years of education: Not on file   Highest education level: Not on file  Occupational History   Not on file   Tobacco Use   Smoking status: Never   Smokeless tobacco: Never  Vaping Use   Vaping Use: Never used  Substance and Sexual Activity   Alcohol use: No   Drug use: No   Sexual activity: Not on file  Other Topics Concern   Not on file  Social History Narrative   Not on file   Social Determinants of Health   Financial Resource Strain: Not on file  Food Insecurity: Not on file  Transportation Needs: Not on file  Physical Activity: Not on file  Stress: Not on file  Social Connections: Not on file   Additional Social History:    Allergies:   Allergies  Allergen Reactions   Atorvastatin Swelling    Labs:  Results for orders placed or performed during the hospital encounter of 02/23/22 (from the past 48 hour(s))  CBC with Differential     Status: Abnormal   Collection Time: 02/23/22  3:53 AM  Result Value Ref Range   WBC 12.3 (H) 4.0 - 10.5 K/uL   RBC 4.56 3.87 - 5.11 MIL/uL   Hemoglobin 10.3 (L) 12.0 - 15.0 g/dL   HCT 35.6 (L) 36.0 - 46.0 %   MCV 78.1 (L) 80.0 - 100.0 fL   MCH 22.6 (L) 26.0 - 34.0 pg   MCHC 28.9 (L) 30.0 - 36.0 g/dL   RDW 17.1 (H) 11.5 - 15.5 %   Platelets 527 (H) 150 - 400 K/uL   nRBC 0.0 0.0 - 0.2 %   Neutrophils Relative % 83 %   Neutro Abs 10.2 (H) 1.7 - 7.7 K/uL   Lymphocytes Relative 10 %   Lymphs Abs 1.3 0.7 - 4.0 K/uL   Monocytes Relative 6 %   Monocytes Absolute 0.7 0.1 - 1.0 K/uL   Eosinophils Relative 0 %   Eosinophils Absolute 0.0 0.0 - 0.5 K/uL   Basophils Relative 1 %   Basophils Absolute 0.1 0.0 - 0.1 K/uL   Immature Granulocytes 0 %   Abs Immature Granulocytes 0.05 0.00 - 0.07 K/uL    Comment: Performed at [facility], [address] [place], [place] [address]  Comprehensive metabolic panel     Status: Abnormal   Collection Time: 02/23/22  3:53 AM  Result Value Ref Range   Sodium 136 135 - 145 mmol/L   Potassium 3.8 3.5 - 5.1 mmol/L   Chloride 105 98 - 111 mmol/L   CO2 21 (L) 22 - 32 mmol/L   Glucose, Bld 311 (H)  70 - 99 mg/dL    Comment: Glucose reference range applies only to samples taken after fasting for at least 8 hours.   BUN 10 6 - 20 mg/dL   Creatinine, Ser 0.60 0.44 - 1.00 mg/dL   Calcium 9.2 8.9 - 10.3 mg/dL   Total Protein 7.7 6.5 - 8.1 g/dL   Albumin 4.2 3.5 - 5.0 g/dL   AST 16 15 - 41 U/L   ALT 12 0 -  44 U/L   Alkaline Phosphatase 76 38 - 126 U/L   Total Bilirubin 0.5 0.3 - 1.2 mg/dL   GFR, Estimated >70 >62 mL/min    Comment: (NOTE) Calculated using the CKD-EPI Creatinine Equation (2021)    Anion gap 10 5 - 15    Comment: Performed at [facility], [address] [place], [place] [address]  Protime-INR     Status: Abnormal   Collection Time: 02/23/22  3:53 AM  Result Value Ref Range   Prothrombin Time 17.4 (H) 11.4 - 15.2 seconds   INR 1.4 (H) 0.8 - 1.2    Comment: (NOTE) INR goal varies based on device and disease states. Performed at [facility], [address] [place], [place] [address]   Acetaminophen level     Status: Abnormal   Collection Time: 02/23/22  3:53 AM  Result Value Ref Range   Acetaminophen (Tylenol), Serum 270 (HH) 10 - 30 ug/mL    Comment: CRITICAL RESULT CALLED TO, READ BACK BY AND VERIFIED WITH ALYCIA BEVERLY@0515  02/23/22 RH (NOTE) Therapeutic concentrations vary significantly. A range of 10-30 ug/mL  may be an effective concentration for many patients. However, some  are best treated at concentrations outside of this range. Acetaminophen concentrations >150 ug/mL at 4 hours after ingestion  and >50 ug/mL at 12 hours after ingestion are often associated with  toxic reactions.  Performed at [facility], [address] [place], [place] [address]   Salicylate level     Status: Abnormal   Collection Time: 02/23/22  3:53 AM  Result Value Ref Range   Salicylate Lvl <7.0 (L) 7.0 - 30.0 mg/dL    Comment: Performed at [facility], [address], [place], [place] [address]  Urine Drug Screen, Qualitative  ([facility] only)     Status: Abnormal   Collection Time: 02/23/22  7:01 AM  Result Value Ref Range   Tricyclic, Ur Screen NONE DETECTED NONE DETECTED   Amphetamines, Ur Screen NONE DETECTED NONE DETECTED   MDMA (Ecstasy)Ur Screen NONE DETECTED NONE DETECTED   Cocaine Metabolite,Ur [place] NONE DETECTED NONE DETECTED   Opiate, Ur Screen POSITIVE (A) NONE DETECTED   Phencyclidine (PCP) Ur S NONE DETECTED NONE DETECTED   Cannabinoid 50 Ng, Ur [place] NONE DETECTED NONE DETECTED   Barbiturates, Ur Screen NONE DETECTED NONE DETECTED   Benzodiazepine, Ur Scrn NONE DETECTED NONE DETECTED   Methadone Scn, Ur NONE DETECTED NONE DETECTED    Comment: (NOTE) Tricyclics + metabolites, urine    Cutoff 1000 ng/mL Amphetamines + metabolites, urine  Cutoff 1000 ng/mL MDMA (Ecstasy), urine              Cutoff 500 ng/mL Cocaine Metabolite, urine          Cutoff 300 ng/mL Opiate + metabolites, urine        Cutoff 300 ng/mL Phencyclidine (PCP), urine         Cutoff 25 ng/mL Cannabinoid, urine                 Cutoff 50 ng/mL Barbiturates + metabolites, urine  Cutoff 200 ng/mL Benzodiazepine, urine              Cutoff 200 ng/mL Methadone, urine                   Cutoff 300 ng/mL  The urine drug screen provides only a preliminary, unconfirmed analytical test result and should not be used for non-medical purposes. Clinical consideration and professional judgment  should be applied to any positive drug screen result due to possible interfering substances. A more specific alternate chemical method must be used in order to obtain a confirmed analytical result. Gas chromatography / mass spectrometry (GC/MS) is the preferred confirm atory method. Performed at [facility], [address] [place], [place] [address]   Urinalysis, Complete w Microscopic     Status: Abnormal   Collection Time: 02/23/22  7:01 AM  Result Value Ref Range   Color, Urine YELLOW (A) YELLOW   APPearance HAZY (A) CLEAR   Specific Gravity,  Urine 1.038 (H) 1.005 - 1.030   pH 5.0 5.0 - 8.0   Glucose, UA >=500 (A) NEGATIVE mg/dL   Hgb urine dipstick NEGATIVE NEGATIVE   Bilirubin Urine NEGATIVE NEGATIVE   Ketones, ur 20 (A) NEGATIVE mg/dL   Protein, ur 100 (A) NEGATIVE mg/dL   Nitrite NEGATIVE NEGATIVE   Leukocytes,Ua NEGATIVE NEGATIVE   RBC / HPF 0-5 0 - 5 RBC/hpf   WBC, UA 0-5 0 - 5 WBC/hpf   Bacteria, UA RARE (A) NONE SEEN   Squamous Epithelial / LPF 6-10 0 - 5   Mucus PRESENT    Hyaline Casts, UA PRESENT     Comment: Performed at [facility], [address] [place], [place] [address]  CBG monitoring, ED     Status: Abnormal   Collection Time: 02/23/22  8:06 AM  Result Value Ref Range   Glucose-Capillary 395 (H) 70 - 99 mg/dL    Comment: Glucose reference range applies only to samples taken after fasting for at least 8 hours.  APTT     Status: None   Collection Time: 02/23/22  8:08 AM  Result Value Ref Range   aPTT 26 24 - 36 seconds    Comment: Performed at [facility], [address] [place], [place] [address]  Hepatic function panel     Status: Abnormal   Collection Time: 02/23/22  8:08 AM  Result Value Ref Range   Total Protein 7.3 6.5 - 8.1 g/dL   Albumin 3.7 3.5 - 5.0 g/dL   AST 102 (H) 15 - 41 U/L   ALT 55 (H) 0 - 44 U/L   Alkaline Phosphatase 76 38 - 126 U/L   Total Bilirubin 0.6 0.3 - 1.2 mg/dL   Bilirubin, Direct 0.1 0.0 - 0.2 mg/dL   Indirect Bilirubin 0.5 0.3 - 0.9 mg/dL    Comment: Performed at [facility], [address] [place], [place] [address]  Acetaminophen level     Status: Abnormal   Collection Time: 02/23/22  8:08 AM  Result Value Ref Range   Acetaminophen (Tylenol), Serum 168 (HH) 10 - 30 ug/mL    Comment: CRITICAL RESULT CALLED TO, READ BACK BY AND VERIFIED WITH LINDSAY BLACK 02/23/22 0843 [facility] (NOTE) Therapeutic concentrations vary significantly. A range of 10-30 ug/mL  may be an effective concentration for many patients. However, some  are best  treated at concentrations outside of this range. Acetaminophen concentrations >150 ug/mL at 4 hours after ingestion  and >50 ug/mL at 12 hours after ingestion are often associated with  toxic reactions.  Performed at [facility], [address] [place], [place] [address]   Lipase, blood     Status: None   Collection Time: 02/23/22  8:08 AM  Result Value Ref Range   Lipase 28 11 - 51 U/L    Comment: Performed at [facility], [address] [place],  [address]  Troponin  I (High Sensitivity)     Status: None   Collection Time: 02/23/22  8:08 AM  Result Value Ref Range   Troponin I (High Sensitivity) 6 <18 ng/L    Comment: (NOTE) Elevated high sensitivity troponin I (hsTnI) values and significant  changes across serial measurements may suggest ACS but many other  chronic and acute conditions are known to elevate hsTnI results.  Refer to the "Links" section for chest pain algorithms and additional  guidance. Performed at [facility], [address] [place], [place] [address]   CBG monitoring, ED     Status: Abnormal   Collection Time: 02/23/22 11:40 AM  Result Value Ref Range   Glucose-Capillary 257 (H) 70 - 99 mg/dL    Comment: Glucose reference range applies only to samples taken after fasting for at least 8 hours.  Troponin I (High Sensitivity)     Status: None   Collection Time: 02/23/22 12:08 PM  Result Value Ref Range   Troponin I (High Sensitivity) 7 <18 ng/L    Comment: (NOTE) Elevated high sensitivity troponin I (hsTnI) values and significant  changes across serial measurements may suggest ACS but many other  chronic and acute conditions are known to elevate hsTnI results.  Refer to the "Links" section for chest pain algorithms and additional  guidance. Performed at [facility], [address] [place], [place] [address]   CBG monitoring, ED     Status: Abnormal   Collection Time: 02/23/22  4:11 PM  Result Value  Ref Range   Glucose-Capillary 246 (H) 70 - 99 mg/dL    Comment: Glucose reference range applies only to samples taken after fasting for at least 8 hours.    Current Facility-Administered Medications  Medication Dose Route Frequency Provider Last Rate Last Admin   acetylcysteine (MUCOMYST) 20 % nebulizer / oral solution 6,000 mg  6,000 mg Oral Q4H Ivor Costa, MD       albuterol (PROVENTIL) (2.5 MG/3ML) 0.083% nebulizer solution 3 mL  3 mL Nebulization Q4H PRN Ivor Costa, MD       amLODipine (NORVASC) tablet 5 mg  5 mg Oral Daily Ivor Costa, MD   5 mg at 02/23/22 1031   dextromethorphan-guaiFENesin ([place] DM) 30-600 MG per 12 hr tablet 1 tablet  1 tablet Oral BID PRN Ivor Costa, MD       diphenhydrAMINE (BENADRYL) injection 12.5 mg  12.5 mg Intravenous Q8H PRN Ivor Costa, MD       ezetimibe (ZETIA) tablet 10 mg  10 mg Oral Daily Ivor Costa, MD   10 mg at 02/23/22 1031   ferrous sulfate tablet 325 mg  325 mg Oral BID WC Ivor Costa, MD   325 mg at [id number]   folic acid (FOLVITE) tablet 1 mg  1 mg Oral Daily Ivor Costa, MD   1 mg at 02/23/22 1031   hydrALAZINE (APRESOLINE) injection 5 mg  5 mg Intravenous Q2H PRN Ivor Costa, MD       ibuprofen (ADVIL) tablet 200 mg  200 mg Oral Q8H PRN Ivor Costa, MD       insulin aspart (novoLOG) injection 0-5 Units  0-5 Units Subcutaneous QHS Ivor Costa, MD       insulin aspart (novoLOG) injection 0-9 Units  0-9 Units Subcutaneous TID WC Ivor Costa, MD   3 Units at 02/23/22 1623   insulin glargine-yfgn (SEMGLEE) injection 110 Units  110 Units Subcutaneous QHS Ivor Costa, MD       insulin glargine-yfgn [facility])  injection 60 Units  60 Units Subcutaneous Daily Lorretta Harp, MD   60 Units at 02/23/22 1032   lacosamide (VIMPAT) tablet 100 mg  100 mg Oral BID Lorretta Harp, MD   100 mg at 02/23/22 0814   lactulose (CHRONULAC) 10 GM/15ML solution 20 g  20 g Oral Daily PRN Lorretta Harp, MD       levETIRAcetam (KEPPRA) tablet 1,000 mg  1,000 mg Oral BID Lorretta Harp, MD    1,000 mg at 02/23/22 0814   LORazepam (ATIVAN) injection 1 mg  1 mg Intravenous Q2H PRN Lorretta Harp, MD       megestrol (MEGACE) tablet 40 mg  40 mg Oral BID Lorretta Harp, MD   40 mg at 02/23/22 1031   Muscle Rub CREA 1 Application  1 Application Topical PRN Lorretta Harp, MD       pantoprazole (PROTONIX) EC tablet 40 mg  40 mg Oral Q1200 Lorretta Harp, MD   40 mg at 02/23/22 1324   prazosin (MINIPRESS) capsule 4 mg  4 mg Oral QHS Lorretta Harp, MD       pregabalin (LYRICA) capsule 75 mg  75 mg Oral TID Lorretta Harp, MD   75 mg at 02/23/22 1624   progesterone (PROMETRIUM) capsule 200 mg  200 mg Oral QHS Lorretta Harp, MD       rivaroxaban Carlena Hurl) tablet 20 mg  20 mg Oral Q supper Lorretta Harp, MD   20 mg at 02/23/22 1625   vitamin B-12 (CYANOCOBALAMIN) tablet 500 mcg  500 mcg Oral Daily Lorretta Harp, MD   500 mcg at 02/23/22 1031   Current Outpatient Medications  Medication Sig Dispense Refill   amLODipine (NORVASC) 5 MG tablet Take 1 tablet (5 mg total) by mouth daily.     ezetimibe (ZETIA) 10 MG tablet Take 1 tablet (10 mg total) by mouth daily.     ferrous sulfate 325 (65 FE) MG tablet Take 1 tablet (325 mg total) by mouth 2 (two) times daily with a meal.  3   folic acid (FOLVITE) 1 MG tablet Take 1 tablet (1 mg total) by mouth daily.     insulin aspart (NOVOLOG) 100 UNIT/ML injection Inject 12 Units into the skin 3 (three) times daily with meals. (Patient taking differently: Inject 20 Units into the skin 3 (three) times daily with meals.) 10 mL 11   insulin glargine-yfgn (SEMGLEE) 100 UNIT/ML injection Inject 80-140 Units into the skin 2 (two) times daily. 80 units every morning and 140 units every evening     Lacosamide 100 MG TABS Take 1 tablet (100 mg total) by mouth 2 (two) times daily. 60 tablet 0   levETIRAcetam (KEPPRA) 1000 MG tablet Take 1 tablet (1,000 mg total) by mouth 2 (two) times daily.     megestrol (MEGACE) 40 MG tablet Take 1 tablet (40 mg total) by mouth 2 (two) times daily.      oxyCODONE-acetaminophen (PERCOCET) 10-325 MG tablet Take 1 tablet by mouth every 5 (five) hours as needed for pain. 25 tablet 0   polyethylene glycol (MIRALAX / GLYCOLAX) 17 g packet Take 17 g by mouth 2 (two) times daily. 14 each 0   pregabalin (LYRICA) 75 MG capsule Take 75 mg by mouth 3 (three) times daily.     progesterone (PROMETRIUM) 200 MG capsule Take 200 mg by mouth at bedtime.     rivaroxaban (XARELTO) 20 MG TABS tablet Take 1 tablet (20 mg total) by mouth daily with supper. STARTING 07/28/21 30 tablet  senna-docusate (SENOKOT-S) 8.6-50 MG tablet Take 2 tablets by mouth 2 (two) times daily.     venlafaxine XR (EFFEXOR-XR) 75 MG 24 hr capsule Take 75 mg by mouth. 75mg  qam and 150mg  qhs     vitamin B-12 (CYANOCOBALAMIN) 500 MCG tablet Take 1 tablet (500 mcg total) by mouth daily.     albuterol (VENTOLIN HFA) 108 (90 Base) MCG/ACT inhaler Inhale 2 puffs into the lungs every 6 (six) hours as needed.     insulin aspart (NOVOLOG) 100 UNIT/ML injection Use as per following sliding scale: CBG 70 - 120: 0 units CBG 121 - 150: 3 units CBG 151 - 200: 4 units CBG 201 - 250: 7 units CBG 251 - 300: 11 units CBG 301 - 350: 15 units CBG 351 - 400: 20 units and call MD/APP for further instructions 10 mL 11   lactulose (CHRONULAC) 10 GM/15ML solution Take 30 mLs (20 g total) by mouth daily as needed for mild constipation. 120 mL 0   lisinopril (ZESTRIL) 10 MG tablet Take 1 tablet (10 mg total) by mouth daily. (Patient not taking: Reported on 02/23/2022)     Menthol-Methyl Salicylate (MUSCLE RUB) 10-15 % CREA Apply 1 application topically as needed for muscle pain.  0   ondansetron (ZOFRAN-ODT) 8 MG disintegrating tablet Take 8 mg by mouth every 8 (eight) hours as needed.     prazosin (MINIPRESS) 1 MG capsule Take 4 mg by mouth at bedtime.     Rivaroxaban (XARELTO) 15 MG TABS tablet Take 1 tablet (15 mg total) by mouth 2 (two) times daily with a meal for 7 days. 14 tablet     Musculoskeletal: Strength &  Muscle Tone: decreased Gait & Station: unsteady Patient leans: N/A            Psychiatric Specialty Exam:  Presentation  General Appearance: No data recorded Eye Contact:No data recorded Speech:No data recorded Speech Volume:No data recorded Handedness:No data recorded  Mood and Affect  Mood:No data recorded Affect:No data recorded  Thought Process  Thought Processes:No data recorded Descriptions of Associations:No data recorded Orientation:No data recorded Thought Content:No data recorded History of Schizophrenia/Schizoaffective disorder:No data recorded Duration of Psychotic Symptoms:No data recorded Hallucinations:No data recorded Ideas of Reference:No data recorded Suicidal Thoughts:No data recorded Homicidal Thoughts:No data recorded  Sensorium  Memory:No data recorded Judgment:No data recorded Insight:No data recorded  Executive Functions  Concentration:No data recorded Attention Span:No data recorded Recall:No data recorded Fund of Knowledge:No data recorded Language:No data recorded  Psychomotor Activity  Psychomotor Activity:No data recorded  Assets  Assets:No data recorded  Sleep  Sleep:No data recorded  Physical Exam: Physical Exam Vitals and nursing note reviewed.  Constitutional:      Appearance: Normal appearance.  HENT:     Head: Normocephalic and atraumatic.     Mouth/Throat:     Pharynx: Oropharynx is clear.  Eyes:     Pupils: Pupils are equal, round, and reactive to light.  Cardiovascular:     Rate and Rhythm: Normal rate and regular rhythm.  Pulmonary:     Effort: Pulmonary effort is normal.     Breath sounds: Normal breath sounds.  Abdominal:     General: Abdomen is flat.     Palpations: Abdomen is soft.  Musculoskeletal:        General: Normal range of motion.  Skin:    General: Skin is warm and dry.  Neurological:     General: No focal deficit present.     Mental Status: She is  alert. Mental status is at  baseline.  Psychiatric:        Attention and Perception: She is inattentive.        Mood and Affect: Mood normal. Affect is blunt.        Speech: Speech is delayed.        Behavior: Behavior is slowed.        Thought Content: Thought content includes suicidal ideation.        Cognition and Memory: Cognition is impaired. Memory is impaired.    Review of Systems  Constitutional: Negative.   HENT: Negative.    Eyes: Negative.   Respiratory: Negative.    Cardiovascular: Negative.   Gastrointestinal: Negative.   Musculoskeletal: Negative.   Skin: Negative.   Neurological: Negative.   Psychiatric/Behavioral:  Positive for depression.    Blood pressure (!) 155/80, pulse (!) 101, temperature 98.3 F (36.8 C), temperature source Oral, resp. rate 15, height 5\' 2"  (1.575 m), weight 86.2 kg, last menstrual period 08/15/2008, SpO2 96 %. Body mass index is 34.75 kg/m.  Treatment Plan Summary: Plan patient came into the hospital with a large acetaminophen overdose with an elevated acetaminophen level.  Receiving acetylcysteine by protocol.  Has other abnormal labs as well.  A little concerned about her elevated prothrombin time.  Wonder if she may have been having chronic acetaminophen toxicity in addition to the acute overdose.  Patient does seem to have had suicidal intent and is blunted in down in her affect and will presumably require inpatient psychiatric treatment once she is medically cleared.  Continue IVC.  Continue psychiatric follow-up  Disposition: Recommend psychiatric Inpatient admission when medically cleared. Supportive therapy provided about ongoing stressors.  Alethia Berthold, MD 02/23/2022 6:56 PM

## 2022-02-23 NOTE — Assessment & Plan Note (Signed)
Patient denies suicidal or homicidal ideations currently -Consulted psychiatry, Dr. Toni Amend

## 2022-02-23 NOTE — Assessment & Plan Note (Signed)
-   IV hydralazine as needed -Amlodipine -Patient is also on prazosin

## 2022-02-23 NOTE — Assessment & Plan Note (Signed)
Recent A1c 6.7, well controlled.  Patient is  taking NovoLog and glargine insulin 80 units in the morning and 140 unit in the evening -Sliding scale insulin -Glargine insulin 60 units in the morning and 110 units in the evening

## 2022-02-23 NOTE — Assessment & Plan Note (Signed)
Zetia

## 2022-02-23 NOTE — Progress Notes (Signed)
Brief Pharmacy Note  61 year old female presenting with intentional overdose of acetaminophen. Took #45 tablets of Tylenol 325 mg.   Labs AST/ALT: 16/12 (7/12 0080), 102/55 (7/12 2778) aPTT: 26 (7/12 2423) Acetaminophen level: 270 (7/12 0353), 168 (7/12 0808)   Poison Control Recommendations: [employer]. Continue NAC at 6,040 mg every 4 hours Levels after 5th maintenance dose including APAP level, hepatic function, and aPTT.

## 2022-02-23 NOTE — Assessment & Plan Note (Signed)
  BMI= 34.75  and BW= 86kg -Diet and exercise.   -Encourage to lose weight.

## 2022-02-23 NOTE — ED Notes (Signed)
Pt in bed with eyes closed, pt arouses easily to verbal stim, upon arousal pt reports 10/10 abd pain, pt then returns to pre aroused state, resps even and unlabored, pt remains on O2 sat and cardiac monitor.

## 2022-02-23 NOTE — ED Notes (Signed)
Critical Result: tylenol 168  Niu, MD aware

## 2022-02-23 NOTE — ED Notes (Signed)
Poison control contacted and recommended:   4 hour tylenol level from the time the med was consumed ~630 AM monitor for sedative effects and EKG changes from Effexor 16-24 hour monitoring

## 2022-02-23 NOTE — Assessment & Plan Note (Signed)
-    continue Xarelto. 

## 2022-02-23 NOTE — Assessment & Plan Note (Signed)
WBC 12.3, no source of infection identified -Check urinalysis

## 2022-02-23 NOTE — ED Triage Notes (Signed)
Pt arrived via [facility] from home with reports of #45 325mg  Tylenol ingestion and #4 Effexor XR 75mg . Pt also reported to EMS she took oxycodone but unknown amt.  Pt states she took the tylenol and effexor around 230am.  Pt arrives alert and oriented. Pt admits to SI attempt by overdose of meds.

## 2022-02-23 NOTE — ED Notes (Signed)
Patient to CT with security at this time.

## 2022-02-23 NOTE — ED Notes (Signed)
Patient assisted to restroom with assistance of RN.

## 2022-02-23 NOTE — ED Notes (Signed)
IVC/Consult Completed/ Rec.Inpt Admit when medically clear

## 2022-02-23 NOTE — Assessment & Plan Note (Addendum)
-  hold Effexor since patient also overdosed on Effexor.

## 2022-02-23 NOTE — Assessment & Plan Note (Signed)
Her chest pain seem to be musculoskeletal since patient has tenderness in front chest wall, may be due to chronic pain syndrome.  Low suspicions for PE since patient is on Xarelto. -Trend troponin -As needed ibuprofen

## 2022-02-23 NOTE — ED Notes (Signed)
Lunch tray at bedside. ?

## 2022-02-24 ENCOUNTER — Other Ambulatory Visit: Payer: Self-pay

## 2022-02-24 DIAGNOSIS — T50901A Poisoning by unspecified drugs, medicaments and biological substances, accidental (unintentional), initial encounter: Secondary | ICD-10-CM | POA: Diagnosis present

## 2022-02-24 DIAGNOSIS — T391X2A Poisoning by 4-Aminophenol derivatives, intentional self-harm, initial encounter: Secondary | ICD-10-CM | POA: Diagnosis not present

## 2022-02-24 DIAGNOSIS — R1084 Generalized abdominal pain: Secondary | ICD-10-CM | POA: Diagnosis not present

## 2022-02-24 DIAGNOSIS — T1491XA Suicide attempt, initial encounter: Secondary | ICD-10-CM | POA: Diagnosis not present

## 2022-02-24 LAB — CBC
HCT: 35.4 % — ABNORMAL LOW (ref 36.0–46.0)
HCT: 31.9 % — ABNORMAL LOW (ref 36.0–46.0)
Hemoglobin: 10.4 g/dL — ABNORMAL LOW (ref 12.0–15.0)
Hemoglobin: 9.4 g/dL — ABNORMAL LOW (ref 12.0–15.0)
MCH: 22.5 pg — ABNORMAL LOW (ref 26.0–34.0)
MCH: 22.7 pg — ABNORMAL LOW (ref 26.0–34.0)
MCHC: 29.4 g/dL — ABNORMAL LOW (ref 30.0–36.0)
MCHC: 29.5 g/dL — ABNORMAL LOW (ref 30.0–36.0)
MCV: 76.6 fL — ABNORMAL LOW (ref 80.0–100.0)
MCV: 76.9 fL — ABNORMAL LOW (ref 80.0–100.0)
Platelets: 473 10*3/uL — ABNORMAL HIGH (ref 150–400)
Platelets: 437 10*3/uL — ABNORMAL HIGH (ref 150–400)
RBC: 4.62 MIL/uL (ref 3.87–5.11)
RBC: 4.15 MIL/uL (ref 3.87–5.11)
RDW: 17.3 % — ABNORMAL HIGH (ref 11.5–15.5)
RDW: 17.3 % — ABNORMAL HIGH (ref 11.5–15.5)
WBC: 16 10*3/uL — ABNORMAL HIGH (ref 4.0–10.5)
WBC: 14.9 10*3/uL — ABNORMAL HIGH (ref 4.0–10.5)
nRBC: 0 % (ref 0.0–0.2)
nRBC: 0 % (ref 0.0–0.2)

## 2022-02-24 LAB — BASIC METABOLIC PANEL
Anion gap: 12 (ref 5–15)
BUN: 9 mg/dL (ref 6–20)
CO2: 22 mmol/L (ref 22–32)
Calcium: 9 mg/dL (ref 8.9–10.3)
Chloride: 104 mmol/L (ref 98–111)
Creatinine, Ser: 0.84 mg/dL (ref 0.44–1.00)
GFR, Estimated: >60 mL/min (ref >60)
Glucose, Bld: 256 mg/dL — ABNORMAL HIGH (ref 70–99)
Potassium: 2.6 mmol/L — CL (ref 3.5–5.1)
Sodium: 138 mmol/L (ref 135–145)

## 2022-02-24 LAB — HEPATIC FUNCTION PANEL
ALT: 59 U/L — ABNORMAL HIGH (ref 0–44)
AST: 40 U/L (ref 15–41)
Albumin: 3.5 g/dL (ref 3.5–5.0)
Alkaline Phosphatase: 80 U/L (ref 38–126)
Bilirubin, Direct: 0.1 mg/dL (ref 0.0–0.2)
Indirect Bilirubin: 0.8 mg/dL (ref 0.3–0.9)
Total Bilirubin: 0.9 mg/dL (ref 0.3–1.2)
Total Protein: 7.1 g/dL (ref 6.5–8.1)

## 2022-02-24 LAB — IRON AND TIBC
Iron: 18 ug/dL — ABNORMAL LOW (ref 28–170)
Saturation Ratios: 6 % — ABNORMAL LOW (ref 10.4–31.8)
TIBC: 325 ug/dL (ref 250–450)
UIBC: 307 ug/dL

## 2022-02-24 LAB — PROTIME-INR
INR: 1.7 — ABNORMAL HIGH (ref 0.8–1.2)
Prothrombin Time: 19.7 seconds — ABNORMAL HIGH (ref 11.4–15.2)

## 2022-02-24 LAB — LACTIC ACID, PLASMA
Lactic Acid, Venous: 1.9 mmol/L (ref 0.5–1.9)
Lactic Acid, Venous: 2.2 mmol/L (ref 0.5–1.9)

## 2022-02-24 LAB — PROCALCITONIN: Procalcitonin: 113.58 ng/mL

## 2022-02-24 LAB — MAGNESIUM: Magnesium: 1.9 mg/dL (ref 1.7–2.4)

## 2022-02-24 LAB — TROPONIN I (HIGH SENSITIVITY)
Troponin I (High Sensitivity): 29 ng/L — ABNORMAL HIGH (ref <18)
Troponin I (High Sensitivity): 46 ng/L — ABNORMAL HIGH (ref <18)

## 2022-02-24 LAB — CBG MONITORING, ED
Glucose-Capillary: 234 mg/dL — ABNORMAL HIGH (ref 70–99)
Glucose-Capillary: 276 mg/dL — ABNORMAL HIGH (ref 70–99)
Glucose-Capillary: 284 mg/dL — ABNORMAL HIGH (ref 70–99)
Glucose-Capillary: 217 mg/dL — ABNORMAL HIGH (ref 70–99)

## 2022-02-24 LAB — ACETAMINOPHEN LEVEL
Acetaminophen (Tylenol), Serum: <10 ug/mL — ABNORMAL LOW (ref 10–30)
Acetaminophen (Tylenol), Serum: <10 ug/mL — ABNORMAL LOW (ref 10–30)

## 2022-02-24 LAB — APTT: aPTT: 38 seconds — ABNORMAL HIGH (ref 24–36)

## 2022-02-24 LAB — GLUCOSE, CAPILLARY: Glucose-Capillary: 256 mg/dL — ABNORMAL HIGH (ref 70–99)

## 2022-02-24 LAB — POTASSIUM: Potassium: 3.1 mmol/L — ABNORMAL LOW (ref 3.5–5.1)

## 2022-02-24 MED ORDER — HYDROCORTISONE (PERIANAL) 2.5 % EX CREA
TOPICAL_CREAM | Freq: Two times a day (BID) | CUTANEOUS | Status: DC
  Filled 2022-02-24: qty 28.35

## 2022-02-24 MED ORDER — SODIUM CHLORIDE 0.9 % IV SOLN: INTRAVENOUS | Status: DC

## 2022-02-24 MED ORDER — ASPIRIN 81 MG PO CHEW
81.0000 mg | CHEWABLE_TABLET | Freq: Every day | ORAL | Status: DC
  Administered 2022-02-24 – 2022-02-26 (×3): 81 mg via ORAL
  Filled 2022-02-24 (×4): qty 1

## 2022-02-24 MED ORDER — PANTOPRAZOLE SODIUM 40 MG IV SOLR: 40.0000 mg | Freq: Once | INTRAVENOUS | Status: DC

## 2022-02-24 MED ORDER — SODIUM CHLORIDE 0.9 % IV SOLN
300.0000 mg | Freq: Once | INTRAVENOUS | Status: AC
  Administered 2022-02-24: 300 mg via INTRAVENOUS
  Filled 2022-02-24: qty 300

## 2022-02-24 MED ORDER — DIPHENHYDRAMINE HCL 50 MG/ML IJ SOLN
12.5000 mg | Freq: Once | INTRAMUSCULAR | Status: DC
Start: 2022-02-24 — End: 2022-02-24

## 2022-02-24 MED ORDER — POTASSIUM CHLORIDE 10 MEQ/100ML IV SOLN
10.0000 meq | INTRAVENOUS | Status: AC
  Administered 2022-02-24 (×3): 10 meq via INTRAVENOUS
  Filled 2022-02-24 (×3): qty 100

## 2022-02-24 MED ORDER — SODIUM CHLORIDE 0.9 % IV BOLUS
1000.0000 mL | Freq: Once | INTRAVENOUS | Status: AC
  Administered 2022-02-24: 1000 mL via INTRAVENOUS

## 2022-02-24 MED ORDER — POTASSIUM CHLORIDE CRYS ER 20 MEQ PO TBCR
40.0000 meq | EXTENDED_RELEASE_TABLET | Freq: Once | ORAL | Status: AC
  Administered 2022-02-24: 40 meq via ORAL
  Filled 2022-02-24: qty 2

## 2022-02-24 MED ORDER — FAMOTIDINE 40 MG/5ML PO SUSR
20.0000 mg | Freq: Once | ORAL | Status: DC
Start: 2022-02-24 — End: 2022-02-26
  Filled 2022-02-24: qty 2.5

## 2022-02-24 MED ORDER — ALUM & MAG HYDROXIDE-SIMETH 200-200-20 MG/5ML PO SUSP
30.0000 mL | ORAL | Status: DC | PRN
  Filled 2022-02-24: qty 30

## 2022-02-24 MED ORDER — OXYCODONE HCL 5 MG PO TABS
5.0000 mg | ORAL_TABLET | Freq: Four times a day (QID) | ORAL | Status: DC | PRN
  Administered 2022-02-24 – 2022-02-25 (×4): 5 mg via ORAL
  Filled 2022-02-24 (×5): qty 1

## 2022-02-24 MED ORDER — POTASSIUM CHLORIDE 10 MEQ/100ML IV SOLN
10.0000 meq | INTRAVENOUS | Status: AC
  Administered 2022-02-24 (×5): 10 meq via INTRAVENOUS
  Filled 2022-02-24 (×5): qty 100

## 2022-02-24 MED ORDER — TRAZODONE HCL 50 MG PO TABS
25.0000 mg | ORAL_TABLET | Freq: Every evening | ORAL | Status: DC | PRN
  Administered 2022-02-24 – 2022-02-27 (×3): 25 mg via ORAL
  Filled 2022-02-24 (×4): qty 1

## 2022-02-24 NOTE — ED Notes (Addendum)
This Clinical research associate spoke with poison control on update of pt. Updated poison control of EKG- QRS duration, and QTC, and potassium levels.   Poison control recommendation:  Replace Magnesium with goal of 2.2 Repeat EKG after bags of potassium.   On call provider, Dr. Arville Care notified.

## 2022-02-24 NOTE — Progress Notes (Addendum)
**  20 Spoke with daughter Joice Lofts who has attended to stepfather and gave permission to share this number with her mother (new number) [phone]  02/24/22 1505  Clinical Encounter Type  Visited With Patient  Visit Type Initial;Spiritual support;Social support  Spiritual Encounters  Spiritual Needs Emotional;Other (Comment) (disabled spouse needs)   Chaplain Dequane Strahan engaged pt when noticing fall risk: pt standing at bedside with cords near feet. Chaplain B stepping in to offer assistance and do an initial assessment of needs.  Pt began to share her story, asking if she would see the psychiatrist again. Pt remorseful, stating she did not fu lly grasp the potential consequences of her actions, that she simply "hoped [she] would not wake up." She shared the financial hardship currently experienced by her and spouse, stating thatb he is "blind" and that they rely on food assistance; also stated spouse is home alone and has virus with diarrhea.  Chaplain B provided active listening and compassionate presence, stating that she would check what contacts we have on file for family that can get involved. Pt states they have two daughters and a 17yo granddaughter. One daughter is Visual merchandiser who pt states is a Risk manager at [employer] in [place] [employer].  Pt emotional and seems to just be overwhelmed. Chaplain B encouraged pt to stay present and not think too far ahead but to concentrate on rest and allowing herself to receive care.  Will follow up with attending and SW.

## 2022-02-24 NOTE — ED Notes (Signed)
Spoke with Poison control  Repeat potassium; wanting K at 4.0-4.5 Recheck on EKG, monitor QRS.   [phone]: call with Update of lab work.

## 2022-02-24 NOTE — Progress Notes (Signed)
       CROSS COVER NOTE  NAME: Lauren Velazquez MRN: [id number] DOB : [birth year]    Date of Service   02/24/2022  HPI/Events of Note   Secure chat received from nursing reporting dizziness and hypotension. BP 78/53 HR HR 110.  On bedside evaluation [name] is alert and oriented with no focal neurological deficits. Equal radial, femoral, and pedal pulses bilaterally. Lungs are clear to auscultation. S1, S2 heart sounds heard without rub, gallop, or murmur. No pulsating abdominal mass appreciated. She denies blurry vision, chest pain, palpitations, or dyspnea. Endorses indigestion.   Interventions   Plan: 1L NS Bolus EKG CBC, BMP, Lactic, Procalcitonin, Troponin   [place] Poison Control Recommendations 0510A: Discontinue NAC Tylenol Level <10, AST, 40 ALT 59; Repeat EKG in AM and IVF for possible delayed symptoms from Venlaxafine.     This document was prepared using Dragon voice recognition software and may include unintentional dictation errors.  Bishop Limbo DNP, MHA, FNP-BC Nurse Practitioner [facility] Pager [phone]

## 2022-02-24 NOTE — Inpatient Diabetes Management (Signed)
Inpatient Diabetes Program Recommendations  AACE/ADA: New Consensus Statement on Inpatient Glycemic Control (2015)  Target Ranges:  Prepandial:   less than 140 mg/dL      Peak postprandial:   less than 180 mg/dL (1-2 hours)      Critically ill patients:  140 - 180 mg/dL    Latest Reference Range & Units 02/21/22 04:05  Hemoglobin A1C 4.8 - 5.6 % 6.7 (H)  (H): Data is abnormally high  Latest Reference Range & Units 02/23/22 08:06 02/23/22 11:40 02/23/22 16:11 02/23/22 23:27 02/24/22 01:28  Glucose-Capillary 70 - 99 mg/dL 732 (H)  9 units Novolog  257 (H)  5 units Novolog  60 units Semglee @1032  246 (H)  3 units Novolog  186 (H)     110 units Semglee @2331  234 (H)  (H): Data is abnormally high  Latest Reference Range & Units 02/24/22 08:04  Glucose-Capillary 70 - 99 mg/dL (H)  5 units Novolog   (H): Data is abnormally high    Home DM Meds: Semglee 80 units AM/ 140 units PM   Novolog 0-20 units per SSI   Current Orders: Semglee 60 units AM/ 110 units PM Novolog 0-9 units TID ac/hs      MD- Note AM CBG today 276.  Please consider:  1. Increase Semglee to 70 units AM/ 120 units PM  2. Start Novolog Meal Coverage: Novolog 4 units TID with meals HOLD if pt eats <50% meals    --Will follow patient during hospitalization--  02/26/22 RN, MSN, CDE Diabetes Coordinator Inpatient Glycemic Control Team Team Pager: [phone] (8a-5p)

## 2022-02-24 NOTE — ED Notes (Signed)
Olivia RN aware of assigned bed 

## 2022-02-24 NOTE — ED Notes (Signed)
NP Bishop Limbo messaged regarding pts new hypotension.  Pt reports feeling light headed and dizzy.  Pt is A&O at this time.

## 2022-02-24 NOTE — Progress Notes (Addendum)
  Progress Note   Patient: Lauren Velazquez ZOX:[id number] DOB: 09-01-1960 DOA: 02/23/2022     1 DOS: the patient was seen and examined on 02/24/2022   Brief hospital course: Lauren Velazquez is a 61 y.o. female with medical history significant of depression with anxiety, hypertension, hyperlipidemia, diabetes mellitus, asthma, anemia, vertigo, obesity with BMI 34.75, seizure, DVT on Xarelto, OSA, chronic pain, vaginal bleeding, who presents with overdose. Her acetaminophen level was 270, INR 1.4, PTT 26.  Patient was given activated charcoal, and acetylcysteine IV infusion, acetaminophen level had dropped down. Patient also being evaluated by psychiatry, will need inpatient psychiatry treatment.  Assessment and Plan: Intentional acetaminophen overdose. Suicide attempt Patient initially treated N-acetylcysteine, acetaminophen level had dropped down below testing level. Patient liver function is still within normal limits except INR 1.4.  Recheck liver panel tomorrow. Patient has significant elevation of procalcitonin level more than 100, but no evidence of infection at this time.  It is possible that acetaminophen has affected the liver, causing elevated procalcitonin level.  I have performed a literature search, could not find any data.  I will recheck procalcitonin level tomorrow, will consider antibiotic treatment if there is no significant drop.  Abdominal pain. Nausea vomiting and diarrhea. Nonobstructing left kidney stone. Chronic gallstone. Patient has upper abdominal pain, most likely due to overdose of the medicine.  Patient started have loose stools today which appear black from taking activated charcoal. Symptom improved today.  Severe hypokalemia. Potassium 2.6, give 60 milliequivalents of KCl IV.  Discontinue  IV fluids, check levels tomorrow.  Uncontrolled type 2 diabetes with hyperglycemia on long-term insulin use. She is currently on very high dose insulin glargine.  We will  continue.  Glucose still elevated  Seizure disorder. Continue home seizure medicines.  History of DVT. Continue anticoagulation.  History of stroke. Continue Zetia.   Obesity with BMI 34.75.  Diet exercise.  Vaginal bleeding. Anemia. Reactive thrombocytopenia. Patient was followed by OB/GYN for vaginal bleeding, will check iron level, she may need IV iron.         Subjective:  Patient doing better, no abdominal pain nausea vomiting.  Has some loose stools which appear to be black. No shortness of breath or cough. No dysuria or hematuria.  Physical Exam: Vitals:   02/24/22 1030 02/24/22 1115 02/24/22 1145 02/24/22 1245  BP: (!) 107/55 123/83 123/72 (!) 144/78  Pulse: 97 (!) 104 87 96  Resp: 17 18 17  (!) 22  Temp:      TempSrc:      SpO2: 99% 100% 98% 100%  Weight:      Height:       General exam: Appears calm and comfortable  Respiratory system: Clear to auscultation. Respiratory effort normal. Cardiovascular system: S1 & S2 heard, RRR. No JVD, murmurs, rubs, gallops or clicks. No pedal edema. Gastrointestinal system: Abdomen is nondistended, soft and nontender. No organomegaly or masses felt. Normal bowel sounds heard. Central nervous system: Alert and oriented. No focal neurological deficits. Extremities: Symmetric 5 x 5 power. Skin: No rashes, lesions or ulcers Psychiatry: Judgement and insight appear normal. Mood & affect appropriate.   Data Reviewed:  CT scan results and lab results reviewed.  Family Communication:   Disposition: Status is: Inpatient Remains inpatient appropriate because: Severity of disease.  Planned Discharge Destination:  Mental health.    Time spent: 55 minutes  Author: , MD 02/24/2022 2:45 PM  For on call review [email or web address].

## 2022-02-24 NOTE — Hospital Course (Signed)
Lauren Velazquez is a 61 y.o. female with medical history significant of depression with anxiety, hypertension, hyperlipidemia, diabetes mellitus, asthma, anemia, vertigo, obesity with BMI 34.75, seizure, DVT on Xarelto, OSA, chronic pain, vaginal bleeding, who presents with overdose. Her acetaminophen level was 270, INR 1.4, PTT 26.  Patient was given activated charcoal, and acetylcysteine IV infusion, acetaminophen level had dropped down. Patient also being evaluated by psychiatry, will need inpatient psychiatry treatment.

## 2022-02-24 NOTE — Progress Notes (Signed)
Per 08/30/21 telemedicine visit with Nilda Calamity, patient was instructed to continue rivaroxaban for 3 months, then start aspirin 81 mg daily. Patient has history of DVT on 07/26/21 hospital admission and history  of CVA on 06/30/2021. Patient has since completed 3 months of anticoagulation. Discussed with treatment team to discontinue rivaroxaban and start aspirin 81mg  as instructed outpatient.

## 2022-02-24 NOTE — ED Notes (Signed)
IVC BUT TO BE ADMITTED MEDICALLY

## 2022-02-24 NOTE — ED Notes (Signed)
Into patient's room to respond to call light.  Patient sitting at side of bed.  Voicing discontent with several things including breakfast options (syrup, orange juice and no silverware to eat eggs with), c/o rectal pain from moving bowels, and c/o generalized body pain when laying down.  Patients blood pressure 94/70.  Offered to assist patient back into bed with position change. Patient declined stating "My whole body hurts and I cannot lay down".  Offered patient water and/or diet soda to drink, patient declined stating "I don't want water or soda".  Patient requesting to see her doctor - informed patient that we will message physician and let him know she wishes to see him and also inform him of her rectal pain.

## 2022-02-24 NOTE — ED Notes (Signed)
IVC/pending psych inpatient admission when medically cleared 

## 2022-02-24 NOTE — ED Notes (Signed)
Pt eating lunch meal.

## 2022-02-25 ENCOUNTER — Encounter: Payer: Self-pay | Admitting: Internal Medicine

## 2022-02-25 DIAGNOSIS — E876 Hypokalemia: Secondary | ICD-10-CM

## 2022-02-25 DIAGNOSIS — T391X2A Poisoning by 4-Aminophenol derivatives, intentional self-harm, initial encounter: Secondary | ICD-10-CM | POA: Diagnosis not present

## 2022-02-25 DIAGNOSIS — T1491XA Suicide attempt, initial encounter: Secondary | ICD-10-CM | POA: Diagnosis not present

## 2022-02-25 LAB — GLUCOSE, CAPILLARY
Glucose-Capillary: 257 mg/dL — ABNORMAL HIGH (ref 70–99)
Glucose-Capillary: 229 mg/dL — ABNORMAL HIGH (ref 70–99)
Glucose-Capillary: 152 mg/dL — ABNORMAL HIGH (ref 70–99)
Glucose-Capillary: 77 mg/dL (ref 70–99)
Glucose-Capillary: 131 mg/dL — ABNORMAL HIGH (ref 70–99)

## 2022-02-25 LAB — COMPREHENSIVE METABOLIC PANEL WITH GFR
ALT: 49 U/L — ABNORMAL HIGH (ref 0–44)
AST: 32 U/L (ref 15–41)
Albumin: 3.4 g/dL — ABNORMAL LOW (ref 3.5–5.0)
Alkaline Phosphatase: 73 U/L (ref 38–126)
Anion gap: 5 (ref 5–15)
BUN: 8 mg/dL (ref 6–20)
CO2: 22 mmol/L (ref 22–32)
Calcium: 8.8 mg/dL — ABNORMAL LOW (ref 8.9–10.3)
Chloride: 113 mmol/L — ABNORMAL HIGH (ref 98–111)
Creatinine, Ser: 0.66 mg/dL (ref 0.44–1.00)
GFR, Estimated: >60 mL/min
Glucose, Bld: 269 mg/dL — ABNORMAL HIGH (ref 70–99)
Potassium: 3.1 mmol/L — ABNORMAL LOW (ref 3.5–5.1)
Sodium: 140 mmol/L (ref 135–145)
Total Bilirubin: 0.4 mg/dL (ref 0.3–1.2)
Total Protein: 6.3 g/dL — ABNORMAL LOW (ref 6.5–8.1)

## 2022-02-25 LAB — CBC
HCT: 28.8 % — ABNORMAL LOW (ref 36.0–46.0)
Hemoglobin: 8.7 g/dL — ABNORMAL LOW (ref 12.0–15.0)
MCH: 23 pg — ABNORMAL LOW (ref 26.0–34.0)
MCHC: 30.2 g/dL (ref 30.0–36.0)
MCV: 76 fL — ABNORMAL LOW (ref 80.0–100.0)
Platelets: 444 K/uL — ABNORMAL HIGH (ref 150–400)
RBC: 3.79 MIL/uL — ABNORMAL LOW (ref 3.87–5.11)
RDW: 17.8 % — ABNORMAL HIGH (ref 11.5–15.5)
WBC: 8.6 K/uL (ref 4.0–10.5)
nRBC: 0 % (ref 0.0–0.2)

## 2022-02-25 LAB — PHOSPHORUS: Phosphorus: <1 mg/dL — CL (ref 2.5–4.6)

## 2022-02-25 LAB — MAGNESIUM: Magnesium: 2 mg/dL (ref 1.7–2.4)

## 2022-02-25 LAB — VITAMIN B12: Vitamin B-12: 641 pg/mL (ref 180–914)

## 2022-02-25 LAB — POTASSIUM: Potassium: 3.7 mmol/L (ref 3.5–5.1)

## 2022-02-25 LAB — PROCALCITONIN: Procalcitonin: 41.1 ng/mL

## 2022-02-25 MED ORDER — INSULIN GLARGINE-YFGN 100 UNIT/ML ~~LOC~~ SOLN
120.0000 [IU] | Freq: Every day | SUBCUTANEOUS | Status: DC
Start: 2022-02-25 — End: 2022-02-27
  Filled 2022-02-25 (×3): qty 1.2

## 2022-02-25 MED ORDER — ONDANSETRON HCL 4 MG/2ML IJ SOLN: 4.0000 mg | INTRAMUSCULAR | Status: DC | PRN

## 2022-02-25 MED ORDER — INSULIN GLARGINE-YFGN 100 UNIT/ML ~~LOC~~ SOLN
70.0000 [IU] | Freq: Every day | SUBCUTANEOUS | Status: DC
Start: 2022-02-26 — End: 2022-02-27
  Filled 2022-02-25 (×2): qty 0.7

## 2022-02-25 MED ORDER — QUETIAPINE FUMARATE 25 MG PO TABS
25.0000 mg | ORAL_TABLET | Freq: Two times a day (BID) | ORAL | Status: DC
  Administered 2022-02-25 – 2022-02-26 (×4): 25 mg via ORAL
  Filled 2022-02-25 (×6): qty 1

## 2022-02-25 MED ORDER — AMOXICILLIN-POT CLAVULANATE 875-125 MG PO TABS
1.0000 | ORAL_TABLET | Freq: Two times a day (BID) | ORAL | Status: DC
  Administered 2022-02-25 – 2022-02-26 (×4): 1 via ORAL
  Filled 2022-02-25 (×5): qty 1

## 2022-02-25 MED ORDER — POTASSIUM CHLORIDE CRYS ER 20 MEQ PO TBCR
40.0000 meq | EXTENDED_RELEASE_TABLET | ORAL | Status: AC
  Administered 2022-02-25 (×2): 40 meq via ORAL
  Filled 2022-02-25 (×2): qty 2

## 2022-02-25 MED ORDER — AMPICILLIN-SULBACTAM SODIUM 3 (2-1) G IJ SOLR
3.0000 g | Freq: Four times a day (QID) | INTRAMUSCULAR | Status: DC
  Filled 2022-02-25 (×3): qty 8

## 2022-02-25 MED ORDER — LORAZEPAM 0.5 MG PO TABS
0.5000 mg | ORAL_TABLET | Freq: Every evening | ORAL | Status: DC | PRN
  Administered 2022-02-25: 0.5 mg via ORAL
  Filled 2022-02-25: qty 1

## 2022-02-25 MED ORDER — ONDANSETRON HCL 4 MG PO TABS: 4.0000 mg | ORAL_TABLET | Freq: Three times a day (TID) | ORAL | Status: DC | PRN

## 2022-02-25 MED ORDER — LORAZEPAM 1 MG PO TABS
1.0000 mg | ORAL_TABLET | ORAL | Status: DC | PRN
Start: 2022-02-25 — End: 2022-02-27
  Administered 2022-02-25 – 2022-02-27 (×7): 1 mg via ORAL
  Filled 2022-02-25 (×7): qty 1

## 2022-02-25 MED ORDER — DEXTROSE 5 % IV SOLN
45.0000 mmol | Freq: Once | INTRAVENOUS | Status: AC
  Administered 2022-02-25: 45 mmol via INTRAVENOUS
  Filled 2022-02-25: qty 15

## 2022-02-25 NOTE — Progress Notes (Signed)
Lauren Velazquez with poison control called to follow up on patient EKG QT/QTC interval and stated that they recommend a repeat EKG be performed some time this morning on patient. Will notify provider.

## 2022-02-25 NOTE — Progress Notes (Signed)
Dr Arville Care notified pt refusing to take bedtime insulin

## 2022-02-25 NOTE — Plan of Care (Signed)
  Problem: Education: Goal: Ability to describe self-care measures that may prevent or decrease complications (Diabetes Survival Skills Education) will improve Outcome: Progressing Goal: Individualized Educational Video(s) Outcome: Progressing   Problem: Coping: Goal: Ability to adjust to condition or change in health will improve Outcome: Progressing   Problem: Fluid Volume: Goal: Ability to maintain a balanced intake and output will improve Outcome: Progressing   Problem: Metabolic: Goal: Ability to maintain appropriate glucose levels will improve Outcome: Progressing   Problem: Health Behavior/Discharge Planning: Goal: Ability to identify and utilize available resources and services will improve Outcome: Progressing Goal: Ability to manage health-related needs will improve Outcome: Progressing   Problem: Nutritional: Goal: Maintenance of adequate nutrition will improve Outcome: Progressing Goal: Progress toward achieving an optimal weight will improve Outcome: Progressing   Problem: Skin Integrity: Goal: Risk for impaired skin integrity will decrease Outcome: Progressing   Problem: Tissue Perfusion: Goal: Adequacy of tissue perfusion will improve Outcome: Progressing   Problem: Health Behavior/Discharge Planning: Goal: Ability to manage health-related needs will improve Outcome: Progressing   Problem: Education: Goal: Knowledge of General Education information will improve Description: Including pain rating scale, medication(s)/side effects and non-pharmacologic comfort measures Outcome: Progressing   Problem: Clinical Measurements: Goal: Ability to maintain clinical measurements within normal limits will improve Outcome: Progressing Goal: Will remain free from infection Outcome: Progressing Goal: Diagnostic test results will improve Outcome: Progressing Goal: Respiratory complications will improve Outcome: Progressing Goal: Cardiovascular complication will  be avoided Outcome: Progressing   Problem: Activity: Goal: Risk for activity intolerance will decrease Outcome: Progressing   Problem: Nutrition: Goal: Adequate nutrition will be maintained Outcome: Progressing   Problem: Coping: Goal: Level of anxiety will decrease Outcome: Progressing   Problem: Elimination: Goal: Will not experience complications related to bowel motility Outcome: Progressing Goal: Will not experience complications related to urinary retention Outcome: Progressing   Problem: Pain Managment: Goal: General experience of comfort will improve Outcome: Progressing   Problem: Safety: Goal: Ability to remain free from injury will improve Outcome: Progressing   Problem: Skin Integrity: Goal: Risk for impaired skin integrity will decrease Outcome: Progressing

## 2022-02-25 NOTE — Progress Notes (Signed)
Provider notified of critical lab, orders pending per secure chat response

## 2022-02-25 NOTE — Progress Notes (Signed)
Patient refused to have IV access placed. Potassium phosphate infusion was not completed.

## 2022-02-25 NOTE — Progress Notes (Signed)
Nurse gave patient prn trazodone that was ordered to help her sleep and later patient requested pain meds that were given. A short time after pain meds given sitter notified nurse that patient was saying she could not breath, but was up ambulating in the room and seemed to look fine. Nurse went to room and patient was standing by the bathroom and stated "I can't sleep, nurse asked patient is it that she can not sleep or can not breath. Patient then stated I can't sleep and I can not breath when I lie down", the bed was in a flat position and patient is obese therefore nurse suggested patient elevate head of bed when lying down and educated her on why. The patient continued on stating "you only gave me 5 mg of trazodone and I take 200 mg at home and the nurse last night gave me ativan for sleep. Nurse reviewed ativan order and order was for seizures, nurse explained this to patient and told patient that she will notify provider and see what he would like to do. Provider notified.

## 2022-02-25 NOTE — Progress Notes (Signed)
  Progress Note   Patient: Lauren Velazquez WUJ:[id number] DOB: [birth year] DOA: 02/23/2022     2 DOS: the patient was seen and examined on 02/25/2022   Brief hospital course: Lauren Velazquez is a 61 y.o. female with medical history significant of depression with anxiety, hypertension, hyperlipidemia, diabetes mellitus, asthma, anemia, vertigo, obesity with BMI 34.75, seizure, DVT on Xarelto, OSA, chronic pain, vaginal bleeding, who presents with overdose. Her acetaminophen level was 270, INR 1.4, PTT 26.  Patient was given activated charcoal, and acetylcysteine IV infusion, acetaminophen level had dropped down. Patient also being evaluated by psychiatry, will need inpatient psychiatry treatment.  Assessment and Plan: Intentional acetaminophen overdose. Suicide attempt Patient initially treated N-acetylcysteine, acetaminophen level had dropped down below testing level. Patient liver function is still within normal limits except INR 1.4.  Discussed with Dr. Toni Amend, psychiatry will see patient again today, most likely will keep her in the psych unit. Patient may have a chronic acetaminophen overdose with elevated INR.  Liver function changes may also explain elevated procalcitonin level.  Patient does not seem to have any respiratory symptoms, but procalcitonin level still elevated at 40s today, I will add a few days of antibiotics in case patient has aspiration pneumonia.   Abdominal pain. Nausea vomiting and diarrhea. Nonobstructing left kidney stone. Chronic gallstone. No additional abdominal pain.  No nausea vomiting  Severe hypokalemia. Severe hypophosphatemia. Phosphorus less than 1.0, potassium 3.1. We will give 45 mmol of potassium phosphorus, additional 80 mEq KCl orally.  Recheck levels tomorrow.   Uncontrolled type 2 diabetes with hyperglycemia on long-term insulin use. Glucose still running high, increase insulin glargine dose.  Seizure disorder. Continue home seizure  medicines.  History of DVT. Continue anticoagulation.  History of stroke. Continue Zetia.   Obesity with BMI 34.75.  Diet exercise.   Vaginal bleeding. Iron deficiency anemia. Reactive thrombocytopenia.  Patient was followed by OB/GYN for vaginal bleeding, she has iron deficiency, given IV iron today.        Subjective:  Patient is agitated, demanding to go home. She has been refusing her IV infusion.  But after explanation, giving requested Ativan, she states that she is willing to do it.  Denies any short of breath, no abdominal pain nausea vomiting  Physical Exam: Vitals:   02/24/22 2030 02/24/22 2057 02/24/22 2134 02/25/22 0816  BP: 132/72  (!) 149/67 (!) 158/71  Pulse: 75  76 98  Resp: (!) 22  15 20   Temp:  97.9 F (36.6 C) 97.8 F (36.6 C) 98.6 F (37 C)  TempSrc:  Oral Oral Oral  SpO2: 99%  99% 98%  Weight:      Height:       General exam: Appears calm and comfortable  Respiratory system: Clear to auscultation. Respiratory effort normal. Cardiovascular system: S1 & S2 heard, RRR. No JVD, murmurs, rubs, gallops or clicks. No pedal edema. Gastrointestinal system: Abdomen is nondistended, soft and nontender. No organomegaly or masses felt. Normal bowel sounds heard. Central nervous system: Alert and oriented. No focal neurological deficits. Extremities: Symmetric 5 x 5 power. Skin: No rashes, lesions or ulcers Psychiatry: Judgement and insight appear normal. Mood & affect appropriate.   Data Reviewed:  Lab results reviewed  Family Communication:   Disposition: Status is: Inpatient Remains inpatient appropriate because: Severity of disease, multiple infusions.  Planned Discharge Destination:  Mental health    Time spent: 55 minutes  Author: , MD 02/25/2022 12:05 PM  For on call review [email or web address].

## 2022-02-25 NOTE — Progress Notes (Signed)
Patient refuses to be on telemetry

## 2022-02-25 NOTE — Inpatient Diabetes Management (Signed)
Inpatient Diabetes Program Recommendations  AACE/ADA: New Consensus Statement on Inpatient Glycemic Control   Target Ranges:  Prepandial:   less than 140 mg/dL      Peak postprandial:   less than 180 mg/dL (1-2 hours)      Critically ill patients:  140 - 180 mg/dL    Latest Reference Range & Units 02/24/22 08:04 02/24/22 11:46 02/24/22 18:09 02/24/22 22:08 02/25/22 05:44 02/25/22 08:22  Glucose-Capillary 70 - 99 mg/dL 654 (H) 650 (H) 354 (H) 256 (H) 257 (H) 229 (H)   Review of Glycemic Control  Diabetes history: DM2 Outpatient Diabetes medications: Semglee 80 units QAM, Semglee 140 units QPM, Novolog 0-20 units per correction scale Current orders for Inpatient glycemic control: Semglee 60 units QAM, Semglee 110 units QHS, Novolog 0-9 units TID with meals, Novolog 0-5 units QHS  Inpatient Diabetes Program Recommendations:    Insulin: Please consider increasing Semglee to 70 units QAM and Semglee 120 units QHS.  Thanks, Orlando Penner, RN, MSN, CDCES Diabetes Coordinator Inpatient Diabetes Program [phone] (Team Pager from 8am to 5pm)

## 2022-02-26 DIAGNOSIS — T1491XA Suicide attempt, initial encounter: Secondary | ICD-10-CM | POA: Diagnosis not present

## 2022-02-26 DIAGNOSIS — E876 Hypokalemia: Secondary | ICD-10-CM | POA: Diagnosis not present

## 2022-02-26 DIAGNOSIS — T391X2A Poisoning by 4-Aminophenol derivatives, intentional self-harm, initial encounter: Secondary | ICD-10-CM | POA: Diagnosis not present

## 2022-02-26 LAB — BASIC METABOLIC PANEL
Anion gap: 6 (ref 5–15)
BUN: 9 mg/dL (ref 6–20)
CO2: 22 mmol/L (ref 22–32)
Calcium: 9.1 mg/dL (ref 8.9–10.3)
Chloride: 112 mmol/L — ABNORMAL HIGH (ref 98–111)
Creatinine, Ser: 0.64 mg/dL (ref 0.44–1.00)
GFR, Estimated: >60 mL/min (ref >60)
Glucose, Bld: 281 mg/dL — ABNORMAL HIGH (ref 70–99)
Potassium: 4.3 mmol/L (ref 3.5–5.1)
Sodium: 140 mmol/L (ref 135–145)

## 2022-02-26 LAB — CBC
HCT: 30.4 % — ABNORMAL LOW (ref 36.0–46.0)
Hemoglobin: 9 g/dL — ABNORMAL LOW (ref 12.0–15.0)
MCH: 22.9 pg — ABNORMAL LOW (ref 26.0–34.0)
MCHC: 29.6 g/dL — ABNORMAL LOW (ref 30.0–36.0)
MCV: 77.4 fL — ABNORMAL LOW (ref 80.0–100.0)
Platelets: 453 10*3/uL — ABNORMAL HIGH (ref 150–400)
RBC: 3.93 MIL/uL (ref 3.87–5.11)
RDW: 18.6 % — ABNORMAL HIGH (ref 11.5–15.5)
WBC: 9.3 10*3/uL (ref 4.0–10.5)
nRBC: 0 % (ref 0.0–0.2)

## 2022-02-26 LAB — GLUCOSE, CAPILLARY
Glucose-Capillary: 219 mg/dL — ABNORMAL HIGH (ref 70–99)
Glucose-Capillary: 223 mg/dL — ABNORMAL HIGH (ref 70–99)

## 2022-02-26 LAB — PHOSPHORUS: Phosphorus: 2.1 mg/dL — ABNORMAL LOW (ref 2.5–4.6)

## 2022-02-26 LAB — PROCALCITONIN: Procalcitonin: 13.85 ng/mL

## 2022-02-26 LAB — MAGNESIUM: Magnesium: 2.3 mg/dL (ref 1.7–2.4)

## 2022-02-26 MED ORDER — OXYCODONE HCL 5 MG PO TABS
5.0000 mg | ORAL_TABLET | ORAL | Status: DC | PRN
  Administered 2022-02-26 – 2022-02-27 (×6): 5 mg via ORAL
  Filled 2022-02-26 (×6): qty 1

## 2022-02-26 MED ORDER — POTASSIUM & SODIUM PHOSPHATES 280-160-250 MG PO PACK
2.0000 | PACK | Freq: Three times a day (TID) | ORAL | Status: AC
  Filled 2022-02-26 (×4): qty 2

## 2022-02-26 NOTE — Progress Notes (Signed)
Pt refuses all lab work, VS, and meds

## 2022-02-26 NOTE — Progress Notes (Signed)
Patient refusing noon medications, including Phos-NAK order by MD.  Dr. Chipper Herb notified.

## 2022-02-26 NOTE — Progress Notes (Signed)
  Progress Note   Patient: Lauren Velazquez MEQ:[id number] DOB: October 28, 1960 DOA: 02/23/2022     3 DOS: the patient was seen and examined on 02/26/2022   Brief hospital course: Lauren Velazquez is a 61 y.o. female with medical history significant of depression with anxiety, hypertension, hyperlipidemia, diabetes mellitus, asthma, anemia, vertigo, obesity with BMI 34.75, seizure, DVT on Xarelto, OSA, chronic pain, vaginal bleeding, who presents with overdose. Her acetaminophen level was 270, INR 1.4, PTT 26.  Patient was given activated charcoal, and acetylcysteine IV infusion, acetaminophen level had dropped down. Patient also being evaluated by psychiatry, will need inpatient psychiatry treatment.  Assessment and Plan: Intentional acetaminophen overdose. Suicide attempt Aspiration pneumonia ruled in Patient currently is pending nursing home placement.  Due to persistent procalcitonin level, she is treated for aspiration pneumonia.  We will finish 5-day course. Currently patient is pending transfer to psychiatry unit.     Abdominal pain. Nausea vomiting and diarrhea. Nonobstructing left kidney stone. Chronic gallstone. Condition stable.  Severe hypokalemia. Severe hypophosphatemia. Condition has improved, I will continue another day of oral phosphorus.  However patient has been refusing it.  Recheck level tomorrow.   Uncontrolled type 2 diabetes with hyperglycemia on long-term insulin use. Continue current regimen, however, patient has been refusing her insulin.  Seizure disorder. Continue home seizure medicines.  History of DVT. Continue anticoagulation.  History of stroke. Continue Zetia.   Obesity with BMI 34.75.  Diet exercise.   Vaginal bleeding. Iron deficiency anemia. Reactive thrombocytopenia.      Subjective:  Patient is noncompliant with her treatment.  Has been refusing most of her medicines.  Physical Exam: Vitals:   02/24/22 2057 02/24/22 2134 02/25/22 0816  02/26/22 0313  BP:  (!) 149/67 (!) 158/71 (!) 181/92  Pulse:  76 98 (!) 101  Resp:  15 20 19   Temp: 97.9 F (36.6 C) 97.8 F (36.6 C) 98.6 F (37 C) 98.9 F (37.2 C)  TempSrc: Oral Oral Oral Oral  SpO2:  99% 98% 99%  Weight:      Height:       General exam: Appears calm and comfortable  Respiratory system: Clear to auscultation. Respiratory effort normal. Cardiovascular system: S1 & S2 heard, RRR. No JVD, murmurs, rubs, gallops or clicks. No pedal edema. Gastrointestinal system: Abdomen is nondistended, soft and nontender. No organomegaly or masses felt. Normal bowel sounds heard. Central nervous system: Alert and oriented. No focal neurological deficits. Extremities: Symmetric 5 x 5 power. Skin: No rashes, lesions or ulcers Psychiatry: Judgement and insight appear normal. Mood & affect appropriate.   Data Reviewed:  Lab results reviewed  Family Communication:   Disposition: Status is: Inpatient Remains inpatient appropriate because: Unsafe discharge, pending psych transfer  Planned Discharge Destination:  Psychiatry unit    Time spent: 35 minutes  Author: , MD 02/26/2022 2:13 PM  For on call review [email or web address].

## 2022-02-26 NOTE — Plan of Care (Signed)
  Problem: Coping: Goal: Ability to adjust to condition or change in health will improve Outcome: Not Progressing   Problem: Nutritional: Goal: Maintenance of adequate nutrition will improve Outcome: Progressing   Problem: Skin Integrity: Goal: Risk for impaired skin integrity will decrease Outcome: Progressing   Problem: Activity: Goal: Risk for activity intolerance will decrease Outcome: Progressing   Problem: Nutrition: Goal: Adequate nutrition will be maintained Outcome: Progressing

## 2022-02-26 NOTE — Progress Notes (Signed)
Patient refusing VS and blood glucose checks

## 2022-02-26 NOTE — Progress Notes (Signed)
Pt requested trazadone, refused earlier in shift, pt given med swallowed then stated she did not want trazadone, request heard by sitter at bedside [facility] NT.

## 2022-02-27 ENCOUNTER — Other Ambulatory Visit: Payer: Self-pay

## 2022-02-27 ENCOUNTER — Inpatient Hospital Stay
Admission: AD | Admit: 2022-02-27 | Discharge: 2022-03-01 | DRG: 885 | Disposition: A | Discharge status: Home / Self Care | Payer: 59 | Source: Intra-hospital | Attending: Psychiatry | Admitting: Psychiatry

## 2022-02-27 ENCOUNTER — Encounter: Payer: Self-pay | Admitting: Psychiatry

## 2022-02-27 DIAGNOSIS — Z79899 Other long term (current) drug therapy: Secondary | ICD-10-CM | POA: Diagnosis not present

## 2022-02-27 DIAGNOSIS — Z888 Allergy status to other drugs, medicaments and biological substances status: Secondary | ICD-10-CM

## 2022-02-27 DIAGNOSIS — M7989 Other specified soft tissue disorders: Secondary | ICD-10-CM

## 2022-02-27 DIAGNOSIS — E119 Type 2 diabetes mellitus without complications: Secondary | ICD-10-CM | POA: Diagnosis present

## 2022-02-27 DIAGNOSIS — Z7982 Long term (current) use of aspirin: Secondary | ICD-10-CM

## 2022-02-27 DIAGNOSIS — E1165 Type 2 diabetes mellitus with hyperglycemia: Secondary | ICD-10-CM

## 2022-02-27 DIAGNOSIS — K219 Gastro-esophageal reflux disease without esophagitis: Secondary | ICD-10-CM | POA: Diagnosis present

## 2022-02-27 DIAGNOSIS — Z9151 Personal history of suicidal behavior: Secondary | ICD-10-CM | POA: Diagnosis not present

## 2022-02-27 DIAGNOSIS — J45909 Unspecified asthma, uncomplicated: Secondary | ICD-10-CM | POA: Diagnosis present

## 2022-02-27 DIAGNOSIS — F419 Anxiety disorder, unspecified: Secondary | ICD-10-CM | POA: Diagnosis present

## 2022-02-27 DIAGNOSIS — D509 Iron deficiency anemia, unspecified: Secondary | ICD-10-CM | POA: Diagnosis present

## 2022-02-27 DIAGNOSIS — M797 Fibromyalgia: Secondary | ICD-10-CM | POA: Diagnosis present

## 2022-02-27 DIAGNOSIS — M79604 Pain in right leg: Secondary | ICD-10-CM

## 2022-02-27 DIAGNOSIS — I10 Essential (primary) hypertension: Secondary | ICD-10-CM | POA: Diagnosis present

## 2022-02-27 DIAGNOSIS — G4700 Insomnia, unspecified: Secondary | ICD-10-CM | POA: Diagnosis present

## 2022-02-27 DIAGNOSIS — T391X2A Poisoning by 4-Aminophenol derivatives, intentional self-harm, initial encounter: Secondary | ICD-10-CM | POA: Diagnosis present

## 2022-02-27 DIAGNOSIS — E785 Hyperlipidemia, unspecified: Secondary | ICD-10-CM | POA: Diagnosis present

## 2022-02-27 DIAGNOSIS — T1491XA Suicide attempt, initial encounter: Secondary | ICD-10-CM | POA: Diagnosis not present

## 2022-02-27 DIAGNOSIS — Z794 Long term (current) use of insulin: Secondary | ICD-10-CM | POA: Diagnosis not present

## 2022-02-27 DIAGNOSIS — F332 Major depressive disorder, recurrent severe without psychotic features: Principal | ICD-10-CM | POA: Diagnosis present

## 2022-02-27 LAB — GLUCOSE, CAPILLARY
Glucose-Capillary: 252 mg/dL — ABNORMAL HIGH (ref 70–99)
Glucose-Capillary: 303 mg/dL — ABNORMAL HIGH (ref 70–99)
Glucose-Capillary: 208 mg/dL — ABNORMAL HIGH (ref 70–99)
Glucose-Capillary: 331 mg/dL — ABNORMAL HIGH (ref 70–99)
Glucose-Capillary: 306 mg/dL — ABNORMAL HIGH (ref 70–99)

## 2022-02-27 LAB — BASIC METABOLIC PANEL
Anion gap: 8 (ref 5–15)
BUN: 11 mg/dL (ref 6–20)
CO2: 25 mmol/L (ref 22–32)
Calcium: 8.9 mg/dL (ref 8.9–10.3)
Chloride: 106 mmol/L (ref 98–111)
Creatinine, Ser: 0.56 mg/dL (ref 0.44–1.00)
GFR, Estimated: >60 mL/min (ref >60)
Glucose, Bld: 276 mg/dL — ABNORMAL HIGH (ref 70–99)
Potassium: 4 mmol/L (ref 3.5–5.1)
Sodium: 139 mmol/L (ref 135–145)

## 2022-02-27 LAB — PHOSPHORUS: Phosphorus: 2.5 mg/dL (ref 2.5–4.6)

## 2022-02-27 LAB — SARS CORONAVIRUS 2 BY RT PCR: SARS Coronavirus 2 by RT PCR: NEGATIVE

## 2022-02-27 LAB — MAGNESIUM: Magnesium: 2.4 mg/dL (ref 1.7–2.4)

## 2022-02-27 MED ORDER — POTASSIUM & SODIUM PHOSPHATES 280-160-250 MG PO PACK: 2.0000 | PACK | Freq: Three times a day (TID) | ORAL | 0 refills | Status: DC

## 2022-02-27 MED ORDER — INSULIN GLARGINE-YFGN 100 UNIT/ML ~~LOC~~ SOLN
70.0000 [IU] | Freq: Every day | SUBCUTANEOUS | Status: DC
  Administered 2022-02-28: 70 [IU] via SUBCUTANEOUS
  Filled 2022-02-27: qty 0.7

## 2022-02-27 MED ORDER — MEGESTROL ACETATE 20 MG PO TABS
40.0000 mg | ORAL_TABLET | Freq: Two times a day (BID) | ORAL | Status: DC
  Administered 2022-02-28 – 2022-03-01 (×3): 40 mg via ORAL
  Filled 2022-02-27 (×6): qty 2

## 2022-02-27 MED ORDER — LACOSAMIDE 50 MG PO TABS
100.0000 mg | ORAL_TABLET | Freq: Two times a day (BID) | ORAL | Status: DC
  Administered 2022-02-28: 100 mg via ORAL
  Filled 2022-02-27: qty 2

## 2022-02-27 MED ORDER — AMOXICILLIN-POT CLAVULANATE 875-125 MG PO TABS: 1.0000 | ORAL_TABLET | Freq: Two times a day (BID) | ORAL | 0 refills | Status: DC

## 2022-02-27 MED ORDER — MAGNESIUM HYDROXIDE 400 MG/5ML PO SUSP: 30.0000 mL | Freq: Every day | ORAL | Status: DC | PRN

## 2022-02-27 MED ORDER — HYDROCORTISONE (PERIANAL) 2.5 % EX CREA
TOPICAL_CREAM | Freq: Two times a day (BID) | CUTANEOUS | Status: DC
  Filled 2022-02-27: qty 28.35

## 2022-02-27 MED ORDER — AMLODIPINE BESYLATE 5 MG PO TABS
5.0000 mg | ORAL_TABLET | Freq: Once | ORAL | Status: AC
Start: 2022-02-27 — End: 2022-02-27
  Administered 2022-02-27: 5 mg via ORAL
  Filled 2022-02-27: qty 1

## 2022-02-27 MED ORDER — LABETALOL HCL 5 MG/ML IV SOLN: 20.0000 mg | INTRAVENOUS | Status: DC | PRN

## 2022-02-27 MED ORDER — AMOXICILLIN-POT CLAVULANATE 875-125 MG PO TABS
1.0000 | ORAL_TABLET | Freq: Two times a day (BID) | ORAL | Status: DC
  Administered 2022-02-27 – 2022-03-01 (×4): 1 via ORAL
  Filled 2022-02-27 (×5): qty 1

## 2022-02-27 MED ORDER — PANTOPRAZOLE SODIUM 40 MG PO TBEC
40.0000 mg | DELAYED_RELEASE_TABLET | Freq: Every day | ORAL | Status: DC
  Filled 2022-02-27 (×2): qty 1

## 2022-02-27 MED ORDER — AMLODIPINE BESYLATE 5 MG PO TABS
5.0000 mg | ORAL_TABLET | Freq: Every day | ORAL | Status: DC
  Administered 2022-02-28 – 2022-03-01 (×2): 5 mg via ORAL
  Filled 2022-02-27 (×2): qty 1

## 2022-02-27 MED ORDER — DM-GUAIFENESIN ER 30-600 MG PO TB12: 1.0000 | ORAL_TABLET | Freq: Two times a day (BID) | ORAL | Status: DC | PRN

## 2022-02-27 MED ORDER — FOLIC ACID 1 MG PO TABS
1.0000 mg | ORAL_TABLET | Freq: Every day | ORAL | Status: DC
  Administered 2022-02-28: 1 mg via ORAL
  Filled 2022-02-27 (×2): qty 1

## 2022-02-27 MED ORDER — TRAZODONE HCL 50 MG PO TABS
25.0000 mg | ORAL_TABLET | Freq: Every evening | ORAL | Status: DC | PRN
  Administered 2022-02-27 – 2022-02-28 (×2): 25 mg via ORAL
  Filled 2022-02-27 (×2): qty 1

## 2022-02-27 MED ORDER — IBUPROFEN 200 MG PO TABS
200.0000 mg | ORAL_TABLET | Freq: Three times a day (TID) | ORAL | Status: DC | PRN
  Administered 2022-02-27 – 2022-03-01 (×2): 200 mg via ORAL
  Filled 2022-02-27 (×2): qty 1

## 2022-02-27 MED ORDER — INSULIN GLARGINE-YFGN 100 UNIT/ML ~~LOC~~ SOLN
120.0000 [IU] | Freq: Every day | SUBCUTANEOUS | Status: DC
  Administered 2022-02-27: 120 [IU] via SUBCUTANEOUS
  Filled 2022-02-27: qty 1.2

## 2022-02-27 MED ORDER — SODIUM CHLORIDE 0.9 % IV SOLN
300.0000 mg | INTRAVENOUS | Status: DC
  Filled 2022-02-27: qty 15

## 2022-02-27 MED ORDER — EZETIMIBE 10 MG PO TABS
10.0000 mg | ORAL_TABLET | Freq: Every day | ORAL | Status: DC
  Administered 2022-02-28 – 2022-03-01 (×2): 10 mg via ORAL
  Filled 2022-02-27 (×2): qty 1

## 2022-02-27 MED ORDER — DIPHENHYDRAMINE HCL 50 MG/ML IJ SOLN: 12.5000 mg | Freq: Three times a day (TID) | INTRAMUSCULAR | Status: DC | PRN

## 2022-02-27 MED ORDER — ASPIRIN 81 MG PO CHEW
81.0000 mg | CHEWABLE_TABLET | Freq: Every day | ORAL | Status: DC
Start: 2022-02-28 — End: 2022-03-01
  Filled 2022-02-27 (×2): qty 1

## 2022-02-27 MED ORDER — ACETAMINOPHEN 325 MG PO TABS: 650.0000 mg | ORAL_TABLET | Freq: Four times a day (QID) | ORAL | Status: DC | PRN

## 2022-02-27 MED ORDER — QUETIAPINE FUMARATE 25 MG PO TABS
25.0000 mg | ORAL_TABLET | Freq: Two times a day (BID) | ORAL | Status: DC
  Administered 2022-02-27 – 2022-02-28 (×2): 25 mg via ORAL
  Filled 2022-02-27 (×2): qty 1

## 2022-02-27 MED ORDER — OXYCODONE HCL 5 MG PO TABS
5.0000 mg | ORAL_TABLET | ORAL | Status: DC | PRN
  Administered 2022-02-27 – 2022-03-01 (×9): 5 mg via ORAL
  Filled 2022-02-27 (×10): qty 1

## 2022-02-27 MED ORDER — LEVETIRACETAM 500 MG PO TABS
1000.0000 mg | ORAL_TABLET | Freq: Two times a day (BID) | ORAL | Status: DC
  Administered 2022-02-28: 1000 mg via ORAL
  Filled 2022-02-27 (×3): qty 2

## 2022-02-27 MED ORDER — ONDANSETRON HCL 4 MG PO TABS
4.0000 mg | ORAL_TABLET | Freq: Three times a day (TID) | ORAL | Status: DC | PRN
  Administered 2022-02-27: 4 mg via ORAL
  Filled 2022-02-27: qty 1

## 2022-02-27 MED ORDER — ASPIRIN 81 MG PO CHEW: 81.0000 mg | CHEWABLE_TABLET | Freq: Every day | ORAL | 0 refills | Status: AC

## 2022-02-27 MED ORDER — LACTULOSE 10 GM/15ML PO SOLN: 20.0000 g | Freq: Every day | ORAL | Status: DC | PRN

## 2022-02-27 MED ORDER — INSULIN ASPART 100 UNIT/ML IJ SOLN
0.0000 [IU] | Freq: Every day | INTRAMUSCULAR | Status: DC
  Administered 2022-02-27: 4 [IU] via SUBCUTANEOUS
  Filled 2022-02-27: qty 1

## 2022-02-27 MED ORDER — INSULIN ASPART 100 UNIT/ML IJ SOLN
0.0000 [IU] | Freq: Three times a day (TID) | INTRAMUSCULAR | Status: DC
  Administered 2022-02-27: 7 [IU] via SUBCUTANEOUS
  Administered 2022-02-28: 2 [IU] via SUBCUTANEOUS
  Administered 2022-02-28: 5 [IU] via SUBCUTANEOUS
  Administered 2022-03-01: 2 [IU] via SUBCUTANEOUS
  Filled 2022-02-27 (×4): qty 1

## 2022-02-27 MED ORDER — PROGESTERONE MICRONIZED 100 MG PO CAPS
200.0000 mg | ORAL_CAPSULE | Freq: Every day | ORAL | Status: DC
  Filled 2022-02-27 (×2): qty 2

## 2022-02-27 MED ORDER — CYANOCOBALAMIN 500 MCG PO TABS
500.0000 ug | ORAL_TABLET | Freq: Every day | ORAL | Status: DC
  Administered 2022-02-28: 500 ug via ORAL
  Filled 2022-02-27 (×2): qty 1

## 2022-02-27 MED ORDER — ALUM & MAG HYDROXIDE-SIMETH 200-200-20 MG/5ML PO SUSP: 30.0000 mL | ORAL | Status: DC | PRN

## 2022-02-27 MED ORDER — INSULIN GLARGINE-YFGN 100 UNIT/ML ~~LOC~~ SOLN
120.0000 [IU] | Freq: Every day | SUBCUTANEOUS | 11 refills | Status: AC
Start: 2022-02-27 — End: ?

## 2022-02-27 MED ORDER — PRAZOSIN HCL 2 MG PO CAPS
4.0000 mg | ORAL_CAPSULE | Freq: Every day | ORAL | Status: DC
  Administered 2022-02-27: 4 mg via ORAL
  Filled 2022-02-27: qty 2

## 2022-02-27 MED ORDER — INSULIN GLARGINE-YFGN 100 UNIT/ML ~~LOC~~ SOLN: 70.0000 [IU] | Freq: Every day | SUBCUTANEOUS | 11 refills | Status: DC

## 2022-02-27 MED ORDER — HYDRALAZINE HCL 25 MG PO TABS
25.0000 mg | ORAL_TABLET | Freq: Four times a day (QID) | ORAL | Status: DC | PRN
  Filled 2022-02-27: qty 1

## 2022-02-27 MED ORDER — FERROUS SULFATE 325 (65 FE) MG PO TABS
325.0000 mg | ORAL_TABLET | Freq: Two times a day (BID) | ORAL | Status: DC
  Administered 2022-02-28: 325 mg via ORAL
  Filled 2022-02-27 (×4): qty 1

## 2022-02-27 MED ORDER — PREGABALIN 75 MG PO CAPS
75.0000 mg | ORAL_CAPSULE | Freq: Three times a day (TID) | ORAL | Status: DC
  Administered 2022-02-28 – 2022-03-01 (×5): 75 mg via ORAL
  Filled 2022-02-27 (×6): qty 1

## 2022-02-27 NOTE — Plan of Care (Signed)
Pt admitted to BMU from the medical floor.  Mood irritable, demanding and sullen. Pt expressed dissatisfaction with room temperature, food, and lack of TV in room. PT refused many of her medications stating that she will talk to the MD tomorrow; also says she will not take a PRN for BP because her attending is not her PCP.  Pt was educated on the importance of BP and BG management; Pt is apathetic to her readings and indicates that they are actually good for her. Pt denies any mental health concerns.  However, pt presents as delusional and displays manipulative behaviors.

## 2022-02-27 NOTE — Plan of Care (Signed)
Patient refused to take most of her scheduled medication last night including her insulin, seizure medicine (see MAR for reference). Explained the importance of  medication compliance but patient strongly refused. Dr. Andrez Grime aware.  She also refused blood draw this morning.

## 2022-02-27 NOTE — Progress Notes (Addendum)
Pt refused VS, blood sugar check, assessment, and all morning medications. MD aware

## 2022-02-27 NOTE — Discharge Summary (Addendum)
At this Physician Discharge Summary   Patient: Lauren Velazquez MRN: [id number] DOB: 1960-11-23  Admit date:     02/23/2022  Discharge date: 02/27/22  Discharge Physician: Marrion Coy   PCP: Hillery Aldo, MD   Recommendations at discharge:   Transfer to mental health for further treatment.  Discharge Diagnoses: Principal Problem:   Acetaminophen overdose, intentional self-harm, initial encounter [facility]) Active Problems:   Suicidal behavior with attempted self-injury (HCC)   Depression with anxiety   Abdominal pain   Nausea vomiting and diarrhea   DVT (deep venous thrombosis) (HCC)   Diabetes mellitus without complication (HCC)   Hyperlipidemia   Hypertension   Leukocytosis   Normocytic anemia   Seizure (HCC)   Cerebrovascular accident (CVA) (HCC)   Obesity with body mass index of 30.0-39.9   Chest pain   Overdose   Hypophosphatemia   Hypokalemia  Resolved Problems:   * No resolved hospital problems. *  Hospital Course: Lauren Velazquez is a 61 y.o. female with medical history significant of depression with anxiety, hypertension, hyperlipidemia, diabetes mellitus, asthma, anemia, vertigo, obesity with BMI 34.75, seizure, DVT on Xarelto, OSA, chronic pain, vaginal bleeding, who presents with overdose. Her acetaminophen level was 270, INR 1.4, PTT 26.  Patient was given activated charcoal, and acetylcysteine IV infusion, acetaminophen level had dropped down. Patient also being evaluated by psychiatry, will need inpatient psychiatry treatment.  Assessment and Plan: Intentional acetaminophen overdose. Suicide attempt Aspiration pneumonia ruled in Continue complete 5 days of oral antibiotics with Augmentin. Condition had improved, patient be transferred to mental health for further treatment.     Abdominal pain. Nausea vomiting and diarrhea. Nonobstructing left kidney stone. Chronic gallstone. Condition stable.  Severe hypokalemia. Severe hypophosphatemia. Condition also  improved.  Patient has not been taking oral phosphorus as prescribed.  I will give another day of phosphorus for Phos level of 2.1.   Uncontrolled type 2 diabetes with hyperglycemia on long-term insulin use. She was taking a higher dose of insulin doses due to insulin resistance.  Will use current dose.  Seizure disorder. Continue home seizure medicines.  History of DVT. Patient history, patient had a DVT, but completed at least 6 months of treatment, at this point, coagulation will be discontinued.  History of stroke. Continue Zetia.   Obesity with BMI 34.75.  Diet exercise.   Vaginal bleeding. Iron deficiency anemia. Reactive thrombocytopenia.       Consultants: Psych Procedures performed: None  Disposition:  Psych unit Diet recommendation:  Discharge Diet Orders (From admission, onward)     Start     Ordered   02/27/22 0000  Diet - low sodium heart healthy        02/27/22 1040           Carb modified diet DISCHARGE MEDICATION: Allergies as of 02/27/2022       Reactions   Atorvastatin Swelling        Medication List     STOP taking these medications    lisinopril 10 MG tablet Commonly known as: ZESTRIL   megestrol 40 MG tablet Commonly known as: MEGACE       TAKE these medications    albuterol 108 (90 Base) MCG/ACT inhaler Commonly known as: VENTOLIN HFA Inhale 2 puffs into the lungs every 6 (six) hours as needed.   amLODipine 5 MG tablet Commonly known as: NORVASC Take 1 tablet (5 mg total) by mouth daily.   amoxicillin-clavulanate 875-125 MG tablet Commonly known as: AUGMENTIN Take 1 tablet by mouth every  12 (twelve) hours for 3 days.   aspirin 81 MG chewable tablet Chew 1 tablet (81 mg total) by mouth daily. Start taking on: February 28, 2022   cyanocobalamin 500 MCG tablet Commonly known as: CYANOCOBALAMIN Take 1 tablet (500 mcg total) by mouth daily.   ezetimibe 10 MG tablet Commonly known as: ZETIA Take 1 tablet (10 mg total)  by mouth daily.   ferrous sulfate 325 (65 FE) MG tablet Take 1 tablet (325 mg total) by mouth 2 (two) times daily with a meal.   folic acid 1 MG tablet Commonly known as: FOLVITE Take 1 tablet (1 mg total) by mouth daily.   insulin aspart 100 UNIT/ML injection Commonly known as: novoLOG Inject 12 Units into the skin 3 (three) times daily with meals. What changed: how much to take   insulin aspart 100 UNIT/ML injection Commonly known as: novoLOG Use as per following sliding scale: CBG 70 - 120: 0 units CBG 121 - 150: 3 units CBG 151 - 200: 4 units CBG 201 - 250: 7 units CBG 251 - 300: 11 units CBG 301 - 350: 15 units CBG 351 - 400: 20 units and call MD/APP for further instructions What changed: Another medication with the same name was changed. Make sure you understand how and when to take each.   insulin glargine-yfgn 100 UNIT/ML injection Commonly known as: SEMGLEE Inject 1.2 mLs (120 Units total) into the skin at bedtime. What changed: You were already taking a medication with the same name, and this prescription was added. Make sure you understand how and when to take each.   insulin glargine-yfgn 100 UNIT/ML injection Commonly known as: SEMGLEE Inject 0.7 mLs (70 Units total) into the skin daily. Start taking on: February 28, 2022 What changed:  how much to take when to take this additional instructions   Lacosamide 100 MG Tabs Take 1 tablet (100 mg total) by mouth 2 (two) times daily.   lactulose 10 GM/15ML solution Commonly known as: CHRONULAC Take 30 mLs (20 g total) by mouth daily as needed for mild constipation.   levETIRAcetam 1000 MG tablet Commonly known as: KEPPRA Take 1 tablet (1,000 mg total) by mouth 2 (two) times daily.   Muscle Rub 10-15 % Crea Apply 1 application topically as needed for muscle pain.   ondansetron 8 MG disintegrating tablet Commonly known as: ZOFRAN-ODT Take 8 mg by mouth every 8 (eight) hours as needed.   oxyCODONE-acetaminophen  10-325 MG tablet Commonly known as: PERCOCET Take 1 tablet by mouth every 5 (five) hours as needed for pain.   polyethylene glycol 17 g packet Commonly known as: MIRALAX / GLYCOLAX Take 17 g by mouth 2 (two) times daily.   potassium & sodium phosphates 280-160-250 MG Pack Commonly known as: PHOS-NAK Take 2 packets by mouth 4 (four) times daily -  before meals and at bedtime for 1 day.   prazosin 1 MG capsule Commonly known as: MINIPRESS Take 4 mg by mouth at bedtime.   pregabalin 75 MG capsule Commonly known as: LYRICA Take 75 mg by mouth 3 (three) times daily.   progesterone 200 MG capsule Commonly known as: PROMETRIUM Take 200 mg by mouth at bedtime.   rivaroxaban 20 MG Tabs tablet Commonly known as: XARELTO Take 1 tablet (20 mg total) by mouth daily with supper. STARTING 07/28/21 What changed: Another medication with the same name was removed. Continue taking this medication, and follow the directions you see here.   senna-docusate 8.6-50 MG tablet Commonly known  as: Senokot-S Take 2 tablets by mouth 2 (two) times daily.   venlafaxine XR 75 MG 24 hr capsule Commonly known as: EFFEXOR-XR Take 75 mg by mouth. 75mg  qam and 150mg  qhs        Discharge Exam: Filed Weights   02/23/22 0349  Weight: 86.2 kg   General exam: Appears calm and comfortable, morbid obesity Respiratory system: Clear to auscultation. Respiratory effort normal. Cardiovascular system: S1 & S2 heard, RRR. No JVD, murmurs, rubs, gallops or clicks. No pedal edema. Gastrointestinal system: Abdomen is nondistended, soft and nontender. No organomegaly or masses felt. Normal bowel sounds heard. Central nervous system: Alert and oriented. No focal neurological deficits. Extremities: Symmetric 5 x 5 power. Skin: No rashes, lesions or ulcers Psychiatry: Judgement and insight appear normal. Mood & affect appropriate.    Condition at discharge: good  The results of significant diagnostics from this  hospitalization (including imaging, microbiology, ancillary and laboratory) are listed below for reference.   Imaging Studies: CT ABDOMEN PELVIS W CONTRAST  Result Date: 02/23/2022 CLINICAL DATA:  Abdominal pain EXAM: CT ABDOMEN AND PELVIS WITH CONTRAST TECHNIQUE: Multidetector CT imaging of the abdomen and pelvis was performed using the standard protocol following bolus administration of intravenous contrast. RADIATION DOSE REDUCTION: This exam was performed according to the departmental dose-optimization program which includes automated exposure control, adjustment of the mA and/or kV according to patient size and/or use of iterative reconstruction technique. CONTRAST:  04/26/22 OMNIPAQUE IOHEXOL 300 MG/ML SOLN IV. No oral contrast. COMPARISON:  02/20/2022 FINDINGS: Lower chest: Minimal bibasilar atelectasis. Hepatobiliary: Peripherally calcified gallbladder versus large calcified gallstone within gallbladder. Liver unremarkable. Pancreas: Atrophic pancreas without mass Spleen: Normal appearance.  Small adjacent splenule. Adrenals/Urinary Tract: Small nonobstructing LEFT renal calculus. Small exophytic RIGHT renal cyst 16 mm diameter unchanged; no follow-up imaging recommended adrenal glands, kidneys, ureters, and bladder otherwise normal appearance. Stomach/Bowel: Normal appendix. Stomach and bowel loops normal appearance Vascular/Lymphatic: Atherosclerotic calcifications aorta without aneurysm. No adenopathy. Reproductive: Probable 2 cm leiomyoma within uterus upper LEFT. LEFT ovarian cyst 5.1 x 4.6 cm not significantly changed; this is a simple appearing cyst and follow-up ultrasound is recommended in 6-12 months. RIGHT adnexa unremarkable. Other: Tiny umbilical hernia containing fat. No free air or free fluid. No inflammatory process. Musculoskeletal: Unremarkable IMPRESSION: Small nonobstructing LEFT renal calculus. Probable 2 cm leiomyoma within uterus. Peripherally calcified gallbladder versus large  calcified gallstone within gallbladder. Tiny umbilical hernia containing fat. Grossly stable simple appearing LEFT ovarian cyst 5.1 x 4.6 cm; follow-up ultrasound recommended in 6-12 months. No acute intra-abdominal or intrapelvic abnormalities. Aortic Atherosclerosis (ICD10-I70.0). Electronically Signed   By: 8-12 M.D.   On: 02/23/2022 10:08   [place] Abdomen Limited RUQ (LIVER/GB)  Result Date: 02/20/2022 CLINICAL DATA:  Nausea, vomiting EXAM: ULTRASOUND ABDOMEN LIMITED RIGHT UPPER QUADRANT COMPARISON:  None Available. FINDINGS: Gallbladder: Incompletely imaged. Common bile duct: Incompletely imaged Liver: No focal lesion identified. Heterogeneously increased parenchymal echogenicity. Portal vein is patent on color Doppler imaging with normal direction of blood flow towards the liver. Other: None. IMPRESSION: 1. Very limited examination. By sonographer report, patient declined to complete examination. 2.  Hepatic steatosis. Electronically Signed   By: [place] M.D.   On: 02/20/2022 16:04   CT Angio Chest/Abd/Pel for Dissection W and/or Wo Contrast  Result Date: 02/20/2022 CLINICAL DATA:  Clinical suspicion for acute aortic syndrome. Patient presents with chest pain, nausea vomiting and weakness. EXAM: CT ANGIOGRAPHY CHEST, ABDOMEN AND PELVIS TECHNIQUE: Non-contrast CT of the chest was initially obtained.  Multidetector CT imaging through the chest, abdomen and pelvis was performed using the standard protocol during bolus administration of intravenous contrast. Multiplanar reconstructed images and MIPs were obtained and reviewed to evaluate the vascular anatomy. RADIATION DOSE REDUCTION: This exam was performed according to the departmental dose-optimization program which includes automated exposure control, adjustment of the mA and/or kV according to patient size and/or use of iterative reconstruction technique. CONTRAST:  OMNIPAQUE IOHEXOL 350 MG/ML SOLN COMPARISON:  Current chest radiograph. CT  abdomen and pelvis dated 06/20/2021. FINDINGS: CTA CHEST FINDINGS Cardiovascular: Thoracic aorta is normal in caliber. No dissection. Minimal descending thoracic aortic atherosclerosis. Arch branch vessels are widely patent. Heart normal in size and configuration. Minor left coronary artery calcifications. No pericardial effusion. Mediastinum/Nodes: No enlarged mediastinal, hilar, or axillary lymph nodes. Thyroid gland, trachea, and esophagus demonstrate no significant findings. Lungs/Pleura: Lungs are clear. No pleural effusion or pneumothorax. Musculoskeletal: No fracture or acute finding. No bone lesion. No chest wall mass Review of the MIP images confirms the above findings. CTA ABDOMEN AND PELVIS FINDINGS VASCULAR Aorta: Minor atherosclerosis. No aneurysm. No dissection. No significant stenosis. Celiac: Patent without evidence of aneurysm, dissection, vasculitis or significant stenosis. SMA: Patent without evidence of aneurysm, dissection, vasculitis or significant stenosis. Renals: Both renal arteries are patent without evidence of aneurysm, dissection, vasculitis, fibromuscular dysplasia or significant stenosis. IMA: Patent without evidence of aneurysm, dissection, vasculitis or significant stenosis. Inflow: Patent without evidence of aneurysm, dissection, vasculitis or significant stenosis. Veins: No obvious venous abnormality within the limitations of this arterial phase study. Review of the MIP images confirms the above findings. NON-VASCULAR Hepatobiliary: Unremarkable liver. Peripheral calcification along the gallbladder wall, stable. No wall thickening. No adjacent inflammation. No bile duct dilation. Pancreas: No pancreatic mass or inflammation.  No duct dilation. Spleen: Normal in size without focal abnormality. Adrenals/Urinary Tract: Normal adrenal glands. Small nonobstructing stone in the midpole the left kidney. Tiny nonobstructing stone in the anterior midpole the right kidney. Posterior  exophytic low-attenuation right renal mass, 1.4 cm, consistent with a cyst, stable. No other masses. No hydronephrosis. Normal ureters. Normal bladder. Stomach/Bowel: Stomach is within normal limits. Appendix appears normal. No evidence of bowel wall thickening, distention, or inflammatory changes. Lymphatic: No enlarged lymph nodes. Reproductive: Left adnexal cystic mass, 4.8 x 4.3 cm, decreased from 5.4 x 4.4 cm on the prior CT. Normal uterus. No right adnexal mass. Other: No abdominal wall hernia or abnormality. No abdominopelvic ascites. Musculoskeletal: No fracture or acute finding.  No bone lesion. Review of the MIP images confirms the above findings. IMPRESSION: CTA 1. No aortic aneurysm or dissection. Minor atherosclerosis. No findings to suggest acute aortic syndrome. 2. Thoracoabdominal aorta branch vessels are all widely patent. NON CTA 1. No acute findings within the chest, abdomen or pelvis. 2. Gallbladder wall calcification, but no evidence of acute cholecystitis and no change from the prior CT. 3. Left adnexal cyst, decreased in size compared to the prior CT, previously assessed with ultrasound on 07/03/2021. Given the interval decrease in size, this can be considered a benign cyst with no additional follow-up recommended. Electronically Signed   By: Amie Portland M.D.   On: 02/20/2022 13:40   DG Chest 2 View  Result Date: 02/20/2022 CLINICAL DATA:  Shortness of breath. EXAM: CHEST - 2 VIEW COMPARISON:  06/30/2021 FINDINGS: Heart size and mediastinal contours are unremarkable. There is no pleural effusion or edema. No airspace opacities identified. Visualized osseous structures notable for thoracic spondylosis. IMPRESSION: No active cardiopulmonary abnormalities. Electronically Signed  By: Signa Kell M.D.   On: 02/20/2022 09:18   DG Abdomen 1 View  Result Date: 02/09/2022 CLINICAL DATA:  Constipation EXAM: ABDOMEN - 1 VIEW COMPARISON:  06/21/2021 FINDINGS: Normal abdominal gas pattern. No  free intraperitoneal gas. Moderate stool burden within the transverse, descending and rectosigmoid colon. 2.3 cm rounded calcification within the right upper quadrant in keeping with a probable gallstone. Possible 4 mm left renal calculus. Multiple phleboliths noted within the pelvis. No organomegaly. Osseous structures are age-appropriate. IMPRESSION: 1. Moderate stool burden. 2. Cholelithiasis. 3. Possible 4 mm left renal calculus.  Un Electronically Signed   By: Helyn Numbers M.D.   On: 02/09/2022 02:11    Microbiology: Results for orders placed or performed during the hospital encounter of 02/23/22  C Difficile Quick Screen w PCR reflex     Status: None   Collection Time: 02/23/22  8:23 PM   Specimen: STOOL  Result Value Ref Range Status   C Diff antigen NEGATIVE NEGATIVE Final   C Diff toxin NEGATIVE NEGATIVE Final   C Diff interpretation No C. difficile detected.  Final    Comment: Performed at [facility], [address] [place], [place] [address]  Gastrointestinal Panel by PCR , Stool     Status: None   Collection Time: 02/23/22  8:23 PM   Specimen: STOOL  Result Value Ref Range Status   Campylobacter species NOT DETECTED NOT DETECTED Final   Plesimonas shigelloides NOT DETECTED NOT DETECTED Final   Salmonella species NOT DETECTED NOT DETECTED Final   Yersinia enterocolitica NOT DETECTED NOT DETECTED Final   Vibrio species NOT DETECTED NOT DETECTED Final   Vibrio cholerae NOT DETECTED NOT DETECTED Final   Enteroaggregative E coli (EAEC) NOT DETECTED NOT DETECTED Final   Enteropathogenic E coli (EPEC) NOT DETECTED NOT DETECTED Final   Enterotoxigenic E coli (ETEC) NOT DETECTED NOT DETECTED Final   Shiga like toxin producing E coli (STEC) NOT DETECTED NOT DETECTED Final   Shigella/Enteroinvasive E coli (EIEC) NOT DETECTED NOT DETECTED Final   Cryptosporidium NOT DETECTED NOT DETECTED Final   Cyclospora cayetanensis NOT DETECTED NOT DETECTED Final   Entamoeba  histolytica NOT DETECTED NOT DETECTED Final   Giardia lamblia NOT DETECTED NOT DETECTED Final   Adenovirus F40/41 NOT DETECTED NOT DETECTED Final   Astrovirus NOT DETECTED NOT DETECTED Final   Norovirus GI/GII NOT DETECTED NOT DETECTED Final   Rotavirus A NOT DETECTED NOT DETECTED Final   Sapovirus (I, II, IV, and V) NOT DETECTED NOT DETECTED Final    Comment: Performed at [facility], [address] [place], [place] [address]  SARS Coronavirus 2 by RT PCR (hospital order, performed in [facility] hospital lab) *cepheid single result test* Anterior Nasal Swab     Status: None   Collection Time: 02/27/22  8:49 AM   Specimen: Anterior Nasal Swab  Result Value Ref Range Status   SARS Coronavirus 2 by RT PCR NEGATIVE NEGATIVE Final    Comment: (NOTE) SARS-CoV-2 target nucleic acids are NOT DETECTED.  The SARS-CoV-2 RNA is generally detectable in upper and lower respiratory specimens during the acute phase of infection. The lowest concentration of SARS-CoV-2 viral copies this assay can detect is 250 copies / mL. A negative result does not preclude SARS-CoV-2 infection and should not be used as the sole basis for treatment or other patient management decisions.  A negative result may occur with improper specimen collection / handling, submission of specimen other than nasopharyngeal swab, presence of  viral mutation(s) within the areas targeted by this assay, and inadequate number of viral copies (<250 copies / mL). A negative result must be combined with clinical observations, patient history, and epidemiological information.  Fact Sheet for Patients:   RoadLapTop.co.za  Fact Sheet for Healthcare Providers: http://kim-miller.com/  This test is not yet approved or  cleared by the [place] FDA and has been authorized for detection and/or diagnosis of SARS-CoV-2 by FDA under an Emergency Use Authorization (EUA).  This EUA will  remain in effect (meaning this test can be used) for the duration of the COVID-19 declaration under Section 564(b)(1) of the Act, 21 U.S.C. section 360bbb-3(b)(1), unless the authorization is terminated or revoked sooner.  Performed at [facility], [address] [place], [place] [address]     Labs: CBC: Recent Labs  Lab 02/23/22 0353 02/24/22 0209 02/24/22 0441 02/25/22 0530 02/26/22 1001  WBC 12.3* 16.0* 14.9* 8.6 9.3  NEUTROABS 10.2*  --   --   --   --   HGB 10.3* 10.4* 9.4* 8.7* 9.0*  HCT 35.6* 35.4* 31.9* 28.8* 30.4*  MCV 78.1* 76.6* 76.9* 76.0* 77.4*  PLT 527* 473* 437* 444* 453*   Basic Metabolic Panel: Recent Labs  Lab 02/21/22 0405 02/23/22 0353 02/24/22 0209 02/24/22 1148 02/24/22 1757 02/25/22 0530 02/25/22 1445 02/26/22 1001  NA 140 136 138  --   --  140  --  140  K 3.7 3.8 2.6*  --  3.1* 3.1* 3.7 4.3  CL 109 105 104  --   --  113*  --  112*  CO2 22 21* 22  --   --  22  --  22  GLUCOSE 171* 311* 256*  --   --  269*  --  281*  BUN --   --  8  --  9  CREATININE 0.54 0.60 0.84  --   --  0.66  --  0.64  CALCIUM 9.2 9.2 9.0  --   --  8.8*  --  9.1  MG  --   --   --  1.9  --  2.0  --  2.3  PHOS  --   --   --   --   --  <1.0*  --  2.1*   Liver Function Tests: Recent Labs  Lab 02/21/22 0405 02/23/22 0353 02/23/22 0808 02/24/22 0209 02/25/22 0530  AST 15 16 102* 40 32  ALT 16 12 55* 59* 49*  ALKPHOS 71 76 76 80 73  BILITOT 0.8 0.5 0.6 0.9 0.4  PROT 7.6 7.7 7.3 7.1 6.3*  ALBUMIN 4.1 4.2 3.7 3.5 3.4*   CBG: Recent Labs  Lab 02/25/22 2150 02/26/22 1152 02/26/22 2114 02/27/22 0333 02/27/22 1001  GLUCAP 131* 219* 223* 252* 303*    Discharge time spent: greater than 30 minutes.  Signed: Marrion Coy, MD [facility] 02/27/2022

## 2022-02-27 NOTE — Progress Notes (Signed)
Patient demanding with staff requesting medication to be given to her early. Pt given education. Pt presents somatic stating, "I am in so much pain all over, I think I am going to die." Pt given support. Pt compliant with medication administration per MD orders except for 1 medicine, Check MAR. Pt denies SI/HI/AVH and states that she needs to leave to take care of her husband. Pt given support. Pt being monitored Q 15 minutes for safety per unit protocol. Pt remains safe on the unit.

## 2022-02-28 ENCOUNTER — Inpatient Hospital Stay: Payer: 59

## 2022-02-28 DIAGNOSIS — F332 Major depressive disorder, recurrent severe without psychotic features: Secondary | ICD-10-CM | POA: Diagnosis not present

## 2022-02-28 LAB — GLUCOSE, CAPILLARY
Glucose-Capillary: 242 mg/dL — ABNORMAL HIGH (ref 70–99)
Glucose-Capillary: 213 mg/dL — ABNORMAL HIGH (ref 70–99)
Glucose-Capillary: 263 mg/dL — ABNORMAL HIGH (ref 70–99)
Glucose-Capillary: 172 mg/dL — ABNORMAL HIGH (ref 70–99)

## 2022-02-28 MED ORDER — INSULIN ASPART 100 UNIT/ML IJ SOLN
10.0000 [IU] | Freq: Three times a day (TID) | INTRAMUSCULAR | Status: DC
  Administered 2022-02-28 – 2022-03-01 (×3): 10 [IU] via SUBCUTANEOUS
  Filled 2022-02-28 (×3): qty 1

## 2022-02-28 MED ORDER — LEVETIRACETAM 500 MG PO TABS
500.0000 mg | ORAL_TABLET | Freq: Two times a day (BID) | ORAL | Status: DC
  Administered 2022-02-28 – 2022-03-01 (×2): 500 mg via ORAL
  Filled 2022-02-28 (×3): qty 1

## 2022-02-28 MED ORDER — LACOSAMIDE 50 MG PO TABS
50.0000 mg | ORAL_TABLET | Freq: Two times a day (BID) | ORAL | Status: DC
  Administered 2022-02-28 – 2022-03-01 (×2): 50 mg via ORAL
  Filled 2022-02-28 (×2): qty 1

## 2022-02-28 MED ORDER — MIRTAZAPINE 15 MG PO TABS
15.0000 mg | ORAL_TABLET | Freq: Every day | ORAL | Status: DC
  Administered 2022-02-28: 15 mg via ORAL
  Filled 2022-02-28: qty 1

## 2022-02-28 MED ORDER — INSULIN GLARGINE-YFGN 100 UNIT/ML ~~LOC~~ SOLN
140.0000 [IU] | Freq: Every day | SUBCUTANEOUS | Status: DC
  Administered 2022-02-28: 140 [IU] via SUBCUTANEOUS
  Filled 2022-02-28 (×3): qty 1.4

## 2022-02-28 MED ORDER — PRAZOSIN HCL 1 MG PO CAPS
1.0000 mg | ORAL_CAPSULE | Freq: Every day | ORAL | Status: DC
  Administered 2022-02-28: 1 mg via ORAL
  Filled 2022-02-28: qty 1

## 2022-02-28 NOTE — Progress Notes (Signed)
Patient was given majority of her scheduled PO medications. She continues to refuse her Iron tablet. This Probation officer held patient's scheduled Insulin due to order parameters not being met, CBG was 44 at 1750 and 76 at 1818.

## 2022-02-28 NOTE — Progress Notes (Signed)
MD was notified, via secure chat, about patient's refusal to eat lunch, after receiving scheduled Insulin.

## 2022-02-28 NOTE — Group Note (Signed)
[facility] LCSW Group Therapy Note    Group Date: 02/28/2022 Start Time: 1300 End Time: 1400  Type of Therapy and Topic:  Group Therapy:  Overcoming Obstacles  Participation Level:  [facility] PARTICIPATION LEVEL: Did Not Attend  Mood:  Description of Group:   In this group patients will be encouraged to explore what they see as obstacles to their own wellness and recovery. They will be guided to discuss their thoughts, feelings, and behaviors related to these obstacles. The group will process together ways to cope with barriers, with attention given to specific choices patients can make. Each patient will be challenged to identify changes they are motivated to make in order to overcome their obstacles. This group will be process-oriented, with patients participating in exploration of their own experiences as well as giving and receiving support and challenge from other group members.  Therapeutic Goals: 1. Patient will identify personal and current obstacles as they relate to admission. 2. Patient will identify barriers that currently interfere with their wellness or overcoming obstacles.  3. Patient will identify feelings, thought process and behaviors related to these barriers. 4. Patient will identify two changes they are willing to make to overcome these obstacles:    Summary of Patient Progress   X   Therapeutic Modalities:   Cognitive Behavioral Therapy Solution Focused Therapy Motivational Interviewing Relapse Prevention Therapy   Lauren Velazquez J Lauren Cashatt, LCSW 

## 2022-02-28 NOTE — Progress Notes (Signed)
Pt continues to need frequent redirection. Pt tried to go down the hallway that wasn't hers several times. Pt is hateful towards staff calling this Clinical research associate a racist and a just an Tax adviser". Pt continues to threaten staff that she is going to call 911. Phones have been off and she is closely monitored by staff

## 2022-02-28 NOTE — Plan of Care (Signed)
D- Patient alert and oriented. Patient presented in a preoccupied, but pleasant mood on assessment stating that she didn't sleep well last night "I need 200% help with sleep. If I could get my battle with sleep settled, I'd be a happy person". Patient is preoccupied with going home because her husband is blind and there isn't anyone to go and check on him. Patient had complaints of bilateral feet pain, rating her pain a "10/10", in which she did request PRN pain medication from this Clinical research associate. Patient denied any signs of depression, however, she endorsed anxiety, "a little bit", stating "just people", is why she's feeling this way. Patient also denied SI, HI, AVH. Patient had no stated goals for today, just that she's ready to go home because she's worried about her husband.  A- Majority of scheduled medications administered to patient, per MD orders. Support and encouragement provided.  Routine safety checks conducted every 15 minutes.  Patient informed to notify staff with problems or concerns.  R- No adverse drug reactions noted. Patient contracts for safety at this time. Patient compliant with majority of her medications. Patient remains safe at this time.  Problem: Education: Goal: Knowledge of General Education information will improve Description: Including pain rating scale, medication(s)/side effects and non-pharmacologic comfort measures 02/28/2022 1227 by Doyce Para, RN Outcome: Not Progressing 02/28/2022 1025 by Doyce Para, RN Outcome: Progressing   Problem: Health Behavior/Discharge Planning: Goal: Ability to manage health-related needs will improve 02/28/2022 1227 by Doyce Para, RN Outcome: Not Progressing 02/28/2022 1025 by Doyce Para, RN Outcome: Progressing   Problem: Clinical Measurements: Goal: Ability to maintain clinical measurements within normal limits will improve 02/28/2022 1227 by Doyce Para, RN Outcome: Not Progressing 02/28/2022 1025  by Doyce Para, RN Outcome: Progressing Goal: Will remain free from infection 02/28/2022 1227 by Doyce Para, RN Outcome: Not Progressing 02/28/2022 1025 by Doyce Para, RN Outcome: Progressing Goal: Diagnostic test results will improve 02/28/2022 1227 by Doyce Para, RN Outcome: Not Progressing 02/28/2022 1025 by Doyce Para, RN Outcome: Progressing Goal: Respiratory complications will improve 02/28/2022 1227 by Doyce Para, RN Outcome: Not Progressing 02/28/2022 1025 by Doyce Para, RN Outcome: Progressing Goal: Cardiovascular complication will be avoided 02/28/2022 1227 by Doyce Para, RN Outcome: Not Progressing 02/28/2022 1025 by Doyce Para, RN Outcome: Progressing   Problem: Activity: Goal: Risk for activity intolerance will decrease 02/28/2022 1227 by Doyce Para, RN Outcome: Not Progressing 02/28/2022 1025 by Doyce Para, RN Outcome: Progressing   Problem: Nutrition: Goal: Adequate nutrition will be maintained 02/28/2022 1227 by Doyce Para, RN Outcome: Not Progressing 02/28/2022 1025 by Doyce Para, RN Outcome: Progressing   Problem: Coping: Goal: Level of anxiety will decrease 02/28/2022 1227 by Doyce Para, RN Outcome: Not Progressing 02/28/2022 1025 by Doyce Para, RN Outcome: Progressing   Problem: Elimination: Goal: Will not experience complications related to bowel motility 02/28/2022 1227 by Doyce Para, RN Outcome: Not Progressing 02/28/2022 1025 by Doyce Para, RN Outcome: Progressing Goal: Will not experience complications related to urinary retention 02/28/2022 1227 by Doyce Para, RN Outcome: Not Progressing 02/28/2022 1025 by Doyce Para, RN Outcome: Progressing   Problem: Pain Managment: Goal: General experience of comfort will improve 02/28/2022 1227 by Doyce Para, RN Outcome: Not Progressing 02/28/2022 1025 by  Doyce Para, RN Outcome: Progressing   Problem: Safety: Goal: Ability to remain free from injury will improve 02/28/2022 1227 by Doyce Para, RN Outcome: Not Progressing 02/28/2022 1025 by Doyce Para, RN Outcome: Progressing   Problem: Skin Integrity: Goal: Risk for impaired skin integrity  will decrease 02/28/2022 1227 by Doyce Para, RN Outcome: Not Progressing 02/28/2022 1025 by Doyce Para, RN Outcome: Progressing   Problem: Education: Goal: Knowledge of [place] [facility] Education information/materials will improve 02/28/2022 1227 by Doyce Para, RN Outcome: Not Progressing 02/28/2022 1025 by Doyce Para, RN Outcome: Progressing Goal: Emotional status will improve 02/28/2022 1227 by Doyce Para, RN Outcome: Not Progressing 02/28/2022 1025 by Doyce Para, RN Outcome: Progressing Goal: Mental status will improve 02/28/2022 1227 by Doyce Para, RN Outcome: Not Progressing 02/28/2022 1025 by Doyce Para, RN Outcome: Progressing Goal: Verbalization of understanding the information provided will improve 02/28/2022 1227 by Doyce Para, RN Outcome: Not Progressing 02/28/2022 1025 by Doyce Para, RN Outcome: Progressing   Problem: Activity: Goal: Interest or engagement in activities will improve 02/28/2022 1227 by Doyce Para, RN Outcome: Not Progressing 02/28/2022 1025 by Doyce Para, RN Outcome: Progressing Goal: Sleeping patterns will improve 02/28/2022 1227 by Doyce Para, RN Outcome: Not Progressing 02/28/2022 1025 by Doyce Para, RN Outcome: Progressing   Problem: Coping: Goal: Ability to verbalize frustrations and anger appropriately will improve 02/28/2022 1227 by Doyce Para, RN Outcome: Not Progressing 02/28/2022 1025 by Doyce Para, RN Outcome: Progressing Goal: Ability to demonstrate self-control will improve 02/28/2022 1227 by Doyce Para, RN Outcome: Not Progressing 02/28/2022 1025 by Doyce Para, RN Outcome: Progressing   Problem: Health Behavior/Discharge Planning: Goal: Identification of resources available to assist in meeting health care needs will improve 02/28/2022 1227 by Doyce Para, RN Outcome: Not Progressing 02/28/2022 1025 by Doyce Para, RN Outcome: Progressing Goal: Compliance with treatment plan for underlying cause of condition will improve 02/28/2022 1227 by Doyce Para, RN Outcome: Not Progressing 02/28/2022 1025 by Doyce Para, RN Outcome: Progressing   Problem: Physical Regulation: Goal: Ability to maintain clinical measurements within normal limits will improve 02/28/2022 1227 by Doyce Para, RN Outcome: Not Progressing 02/28/2022 1025 by Doyce Para, RN Outcome: Progressing   Problem: Safety: Goal: Periods of time without injury will increase 02/28/2022 1227 by Doyce Para, RN Outcome: Not Progressing 02/28/2022 1025 by Doyce Para, RN Outcome: Progressing

## 2022-02-28 NOTE — Progress Notes (Signed)
Recreation Therapy Notes    Date: 02/28/2022   Time: 10:05 am     Location: Court yard       Behavioral response: N/A   Intervention Topic: Communication    Discussion/Intervention: Patient refused to attend group.    Clinical Observations/Feedback:  Patient refused to attend group.    Belisa Eichholz LRT/CTRS          Lauren Velazquez 02/28/2022 10:19 AM 

## 2022-02-28 NOTE — Progress Notes (Signed)
Patient refused her chewable aspirin, stating that she doesn't need it. MD will be notified. Patient has made a few statments to this writer about only taking the medications that she can afford when outside of the hospital.

## 2022-02-28 NOTE — Progress Notes (Signed)
Patient came out of her room with no pants on. This Clinical research associate stated to patient that she needs to go into her room and put some pants on, patient stated "I'm going to ultrasound". This Clinical research associate stated to patient that the ultrasound was cancelled. Patient was asked again to put on her pants, she stated that they were sticky, so this writer asked patient if she wanted a pair of scrub pants, and she stated that she had a pair. This Clinical research associate asked patient again to go back to her room and put them on. Patient went back into her room, after redirection, and put on the scrub pants.

## 2022-02-28 NOTE — Progress Notes (Signed)
This Clinical research associate was notified by Ultrasound that patient allowed them to get halfway through with her left leg and refused to let them finish. MD was in the nurses station when the call came through, so he is aware of this.

## 2022-02-28 NOTE — Progress Notes (Signed)
Pt intrusive with staff and needing frequent redirection. Pt refusing to wear socks even after staff members have instructed her to do so. Pt sitting outside of her room trying to argue with staff

## 2022-02-28 NOTE — [facility] Counselor (Signed)
CSW attempted to meet pt for completion of the PSA. However, pt is sleeping soundly and does not appear in any acute distress. CSW will attempt completion of PSA at a later date.   Vilma Meckel. Algis Greenhouse, MSW, LCSW, LCAS 02/28/2022 1:54 PM

## 2022-02-28 NOTE — Progress Notes (Signed)
Patient was given her scheduled medication, in which she tolerated administration well. Patient just stated to this writer that she's probably not going to eat lunch. MD will be notified.

## 2022-02-28 NOTE — BH IP Treatment Plan (Signed)
Interdisciplinary Treatment and Diagnostic Plan Update  02/28/2022 Time of Session: 9:30AM Erza Mothershead MRN: [id number]  Principal Diagnosis: Major depressive disorder, recurrent severe without psychotic features (HCC)  Secondary Diagnoses: Principal Problem:   Major depressive disorder, recurrent severe without psychotic features (HCC)   Current Medications:  Current Facility-Administered Medications  Medication Dose Route Frequency Provider Last Rate Last Admin   acetaminophen (TYLENOL) tablet 650 mg  650 mg Oral Q6H PRN Charm Rings, NP       alum & mag hydroxide-simeth (MAALOX/MYLANTA) 200-200-20 MG/5ML suspension 30 mL  30 mL Oral Q4H PRN Charm Rings, NP       amLODipine (NORVASC) tablet 5 mg  5 mg Oral Daily Charm Rings, NP   5 mg at 02/28/22 0829   amoxicillin-clavulanate (AUGMENTIN) 875-125 MG per tablet 1 tablet  1 tablet Oral Q12H Charm Rings, NP   1 tablet at 02/28/22 6295   aspirin chewable tablet 81 mg  81 mg Oral Daily Charm Rings, NP       cyanocobalamin tablet 500 mcg  500 mcg Oral Daily Shaune Pollack, Jamison Y, NP   500 mcg at 02/28/22 0830   dextromethorphan-guaiFENesin (MUCINEX DM) 30-600 MG per 12 hr tablet 1 tablet  1 tablet Oral BID PRN Charm Rings, NP       diphenhydrAMINE (BENADRYL) injection 12.5 mg  12.5 mg Intravenous Q8H PRN Charm Rings, NP       ezetimibe (ZETIA) tablet 10 mg  10 mg Oral Daily Charm Rings, NP   10 mg at 02/28/22 2841   ferrous sulfate tablet 325 mg  325 mg Oral BID WC Charm Rings, NP   325 mg at 02/28/22 3244   folic acid (FOLVITE) tablet 1 mg  1 mg Oral Daily Charm Rings, NP   1 mg at 02/28/22 0102   hydrALAZINE (APRESOLINE) tablet 25 mg  25 mg Oral Q6H PRN Reggie Pile, MD       hydrocortisone (ANUSOL-HC) 2.5 % rectal cream   Rectal BID Charm Rings, NP       ibuprofen (ADVIL) tablet 200 mg  200 mg Oral Q8H PRN Charm Rings, NP   200 mg at 02/27/22 2007   insulin aspart (novoLOG) injection 0-5 Units   0-5 Units Subcutaneous QHS Charm Rings, NP   4 Units at 02/27/22 2006   insulin aspart (novoLOG) injection 0-9 Units  0-9 Units Subcutaneous TID WC Charm Rings, NP   5 Units at 02/28/22 0830   insulin glargine-yfgn (SEMGLEE) injection 120 Units  120 Units Subcutaneous QHS Charm Rings, NP   120 Units at 02/27/22 2135   insulin glargine-yfgn (SEMGLEE) injection 70 Units  70 Units Subcutaneous Daily Charm Rings, NP   70 Units at 02/28/22 0830   iron sucrose (VENOFER) 300 mg in sodium chloride 0.9 % 250 mL IVPB  300 mg Intravenous Weekly Schermerhorn, Ihor Austin, MD       labetalol (NORMODYNE) injection 20 mg  20 mg Intravenous Q3H PRN Charm Rings, NP       lacosamide (VIMPAT) tablet 100 mg  100 mg Oral BID Charm Rings, NP   100 mg at 02/28/22 0858   lactulose (CHRONULAC) 10 GM/15ML solution 20 g  20 g Oral Daily PRN Charm Rings, NP       levETIRAcetam (KEPPRA) tablet 1,000 mg  1,000 mg Oral BID Charm Rings, NP   1,000 mg at 02/28/22 0830  magnesium hydroxide (MILK OF MAGNESIA) suspension 30 mL  30 mL Oral Daily PRN Charm Rings, NP       megestrol (MEGACE) tablet 40 mg  40 mg Oral BID Charm Rings, NP   40 mg at 02/28/22 0830   ondansetron (ZOFRAN) tablet 4 mg  4 mg Oral Q8H PRN Charm Rings, NP   4 mg at 02/27/22 2301   oxyCODONE (Oxy IR/ROXICODONE) immediate release tablet 5 mg  5 mg Oral Q4H PRN Charm Rings, NP   5 mg at 02/28/22 4431   pantoprazole (PROTONIX) EC tablet 40 mg  40 mg Oral Q1200 Charm Rings, NP       prazosin (MINIPRESS) capsule 4 mg  4 mg Oral QHS Charm Rings, NP   4 mg at 02/27/22 2108   pregabalin (LYRICA) capsule 75 mg  75 mg Oral TID Charm Rings, NP   75 mg at 02/28/22 0830   progesterone (PROMETRIUM) capsule 200 mg  200 mg Oral QHS Charm Rings, NP       QUEtiapine (SEROQUEL) tablet 25 mg  25 mg Oral BID Charm Rings, NP   25 mg at 02/28/22 5400   traZODone (DESYREL) tablet 25 mg  25 mg Oral QHS PRN Charm Rings,  NP   25 mg at 02/27/22 2108   PTA Medications: Medications Prior to Admission  Medication Sig Dispense Refill Last Dose   potassium & sodium phosphates (PHOS-NAK) 280-160-250 MG PACK Take 2 packets by mouth 4 (four) times daily -  before meals and at bedtime for 1 day. 8 packet 0 Past Week   albuterol (VENTOLIN HFA) 108 (90 Base) MCG/ACT inhaler Inhale 2 puffs into the lungs every 6 (six) hours as needed.   prn   amLODipine (NORVASC) 5 MG tablet Take 1 tablet (5 mg total) by mouth daily.   02/22/2022   amoxicillin-clavulanate (AUGMENTIN) 875-125 MG tablet Take 1 tablet by mouth every 12 (twelve) hours for 3 days. 6 tablet 0 02/22/2022   aspirin 81 MG chewable tablet Chew 1 tablet (81 mg total) by mouth daily. 30 tablet 0 02/22/2022   ezetimibe (ZETIA) 10 MG tablet Take 1 tablet (10 mg total) by mouth daily.   02/22/2022   ferrous sulfate 325 (65 FE) MG tablet Take 1 tablet (325 mg total) by mouth 2 (two) times daily with a meal.  3 02/22/2022   folic acid (FOLVITE) 1 MG tablet Take 1 tablet (1 mg total) by mouth daily.   02/22/2022   insulin aspart (NOVOLOG) 100 UNIT/ML injection Inject 12 Units into the skin 3 (three) times daily with meals. (Patient taking differently: Inject 20 Units into the skin 3 (three) times daily with meals.) 10 mL 11 02/22/2022   insulin aspart (NOVOLOG) 100 UNIT/ML injection Use as per following sliding scale: CBG 70 - 120: 0 units CBG 121 - 150: 3 units CBG 151 - 200: 4 units CBG 201 - 250: 7 units CBG 251 - 300: 11 units CBG 301 - 350: 15 units CBG 351 - 400: 20 units and call MD/APP for further instructions 10 mL 11    insulin glargine-yfgn (SEMGLEE) 100 UNIT/ML injection Inject 0.7 mLs (70 Units total) into the skin daily. (Patient taking differently: Inject 80 Units into the skin daily.) 10 mL 11 02/22/2022   insulin glargine-yfgn (SEMGLEE) 100 UNIT/ML injection Inject 1.2 mLs (120 Units total) into the skin at bedtime. (Patient taking differently: Inject 140 Units into the  skin at  bedtime.) 10 mL 11 02/22/2022   Lacosamide 100 MG TABS Take 1 tablet (100 mg total) by mouth 2 (two) times daily. 60 tablet 0 02/22/2022   lactulose (CHRONULAC) 10 GM/15ML solution Take 30 mLs (20 g total) by mouth daily as needed for mild constipation. 120 mL 0 prn   levETIRAcetam (KEPPRA) 1000 MG tablet Take 1 tablet (1,000 mg total) by mouth 2 (two) times daily.   02/22/2022   lisinopril (ZESTRIL) 10 MG tablet Take 1 tablet (10 mg total) by mouth daily. (Patient not taking: Reported on 02/23/2022)      Menthol-Methyl Salicylate (MUSCLE RUB) 10-15 % CREA Apply 1 application topically as needed for muscle pain.  0 prn   ondansetron (ZOFRAN-ODT) 8 MG disintegrating tablet Take 8 mg by mouth every 8 (eight) hours as needed.   prn   oxyCODONE-acetaminophen (PERCOCET) 10-325 MG tablet Take 1 tablet by mouth every 5 (five) hours as needed for pain. 25 tablet 0 02/23/2022   polyethylene glycol (MIRALAX / GLYCOLAX) 17 g packet Take 17 g by mouth 2 (two) times daily. 14 each 0 02/22/2022   prazosin (MINIPRESS) 1 MG capsule Take 4 mg by mouth at bedtime.   02/22/2022   pregabalin (LYRICA) 75 MG capsule Take 75 mg by mouth 3 (three) times daily.   02/22/2022   progesterone (PROMETRIUM) 200 MG capsule Take 200 mg by mouth at bedtime.   02/22/2022   senna-docusate (SENOKOT-S) 8.6-50 MG tablet Take 2 tablets by mouth 2 (two) times daily.   prn   venlafaxine XR (EFFEXOR-XR) 75 MG 24 hr capsule Take 75 mg by mouth. 75mg  qam and 150mg  qhs   02/23/2022   vitamin B-12 (CYANOCOBALAMIN) 500 MCG tablet Take 1 tablet (500 mcg total) by mouth daily.   02/22/2022    Patient Stressors:    Patient Strengths:    Treatment Modalities: Medication Management, Group therapy, Case management,  1 to 1 session with clinician, Psychoeducation, Recreational therapy.   Physician Treatment Plan for Primary Diagnosis: Major depressive disorder, recurrent severe without psychotic features (HCC) Long Term Goal(s):     Short Term  Goals:    Medication Management: Evaluate patient's response, side effects, and tolerance of medication regimen.  Therapeutic Interventions: 1 to 1 sessions, Unit Group sessions and Medication administration.  Evaluation of Outcomes: Not Progressing  Physician Treatment Plan for Secondary Diagnosis: Principal Problem:   Major depressive disorder, recurrent severe without psychotic features (HCC)  Long Term Goal(s):     Short Term Goals:       Medication Management: Evaluate patient's response, side effects, and tolerance of medication regimen.  Therapeutic Interventions: 1 to 1 sessions, Unit Group sessions and Medication administration.  Evaluation of Outcomes: Not Progressing   RN Treatment Plan for Primary Diagnosis: Major depressive disorder, recurrent severe without psychotic features (HCC) Long Term Goal(s): Knowledge of disease and therapeutic regimen to maintain health will improve  Short Term Goals: Ability to demonstrate self-control, Ability to participate in decision making will improve, Ability to verbalize feelings will improve, Ability to disclose and discuss suicidal ideas, Ability to identify and develop effective coping behaviors will improve, and Compliance with prescribed medications will improve  Medication Management: RN will administer medications as ordered by provider, will assess and evaluate patient's response and provide education to patient for prescribed medication. RN will report any adverse and/or side effects to prescribing provider.  Therapeutic Interventions: 1 on 1 counseling sessions, Psychoeducation, Medication administration, Evaluate responses to treatment, Monitor vital signs and CBGs as ordered,  Perform/monitor CIWA, COWS, AIMS and Fall Risk screenings as ordered, Perform wound care treatments as ordered.  Evaluation of Outcomes: Not Progressing   LCSW Treatment Plan for Primary Diagnosis: Major depressive disorder, recurrent severe without  psychotic features (HCC) Long Term Goal(s): Safe transition to appropriate next level of care at discharge, Engage patient in therapeutic group addressing interpersonal concerns.  Short Term Goals: Engage patient in aftercare planning with referrals and resources, Increase social support, Increase ability to appropriately verbalize feelings, Increase emotional regulation, Facilitate acceptance of mental health diagnosis and concerns, and Increase skills for wellness and recovery  Therapeutic Interventions: Assess for all discharge needs, 1 to 1 time with Social worker, Explore available resources and support systems, Assess for adequacy in community support network, Educate family and significant other(s) on suicide prevention, Complete Psychosocial Assessment, Interpersonal group therapy.  Evaluation of Outcomes: Not Progressing   Progress in Treatment: Attending groups: No. Participating in groups: No. Taking medication as prescribed: Yes. Toleration medication: Yes. Family/Significant other contact made: No, will contact:  once permission is given. Patient understands diagnosis: Yes. Discussing patient identified problems/goals with staff: Yes. Medical problems stabilized or resolved: Yes. Denies suicidal/homicidal ideation: Yes. Issues/concerns per patient self-inventory: No. Other: none  New problem(s) identified: No, Describe:  none  New Short Term/Long Term Goal(s): medication management for mood stabilization; elimination of SI thoughts; development of comprehensive mental wellness/sobriety plan.   Patient Goals:  "well I've been here almost a week and most of them I think I figured them out"  Discharge Plan or Barriers: CSW to assist patient in development of appropriate discharge plans.   Reason for Continuation of Hospitalization: Anxiety Depression Medication stabilization Suicidal ideation  Estimated Length of Stay:  1-7 days  Last 3 [place] Suicide Severity Risk  Score: Flowsheet Row Admission (Current) from 02/27/2022 in [facility]IC[facility] ED to Hosp-Admission (Discharged) from [facility]/23/2022 in [facility] to Hosp-Admission (Discharged) from 02/20/2022 in [facility] (1A)  C-SSRS RISK CATEGORY High Risk High Risk No Risk       Last PHQ 2/9 Scores:     No data to display          Scribe for Treatment Team: Harden Mo, Alexander Mt 02/28/2022 9:37 AM

## 2022-02-28 NOTE — Progress Notes (Signed)
Patient refused her scheduled Protonix stating that she doesn't need it. Patient walked out of the medication room stating, under her breath, "any dollar they can get for the insurance company".

## 2022-02-28 NOTE — [facility] Suicide Risk Assessment (Signed)
[facility] Admission Suicide Risk Assessment   Nursing information obtained from:  Patient Demographic factors:  Caucasian Current Mental Status:  NA Loss Factors:  NA Historical Factors:  Prior suicide attempts Risk Reduction Factors:  Sense of responsibility to family  Total Time spent with patient: 1 hour Principal Problem: Major depressive disorder, recurrent severe without psychotic features (HCC) Diagnosis:  Principal Problem:   Major depressive disorder, recurrent severe without psychotic features (HCC) Active Problems:   Diabetes mellitus type 2, uncomplicated (HCC)   Hyperlipidemia   Hypertension   Acetaminophen overdose, intentional self-harm, initial encounter (HCC)  Subjective Data: Patient seen and chart reviewed.  61 year old woman admitted to the medical service after an intentional overdose of acetaminophen.  Patient actually had a dangerously elevated acetaminophen level and received appropriate treatment with acetylcysteine.  She was referred then for admission to the psychiatric service.  On interview today patient admits to having intentionally overdosed on ibuprofen with suicidal intent but claims that that is all in the past and denies any suicidal thoughts or intent at this point.  Denies psychotic symptoms.  She is very focused on multiple somatic complaints including pain in her lower extremities.  Continued Clinical Symptoms:  Alcohol Use Disorder Identification Test Final Score (AUDIT): 0 The "Alcohol Use Disorders Identification Test", Guidelines for Use in Primary Care, Second Edition.  World Science writer [place]). Score between 0-7:  no or low risk or alcohol related problems. Score between 8-15:  moderate risk of alcohol related problems. Score between 16-19:  high risk of alcohol related problems. Score 20 or above:  warrants further diagnostic evaluation for alcohol dependence and treatment.   CLINICAL FACTORS:   Depression:   Impulsivity Medical  Diagnoses and Treatments/Surgeries   Musculoskeletal: Strength & Muscle Tone: within normal limits Gait & Station: normal Patient leans: N/A  Psychiatric Specialty Exam:  Presentation  General Appearance: No data recorded Eye Contact:No data recorded Speech:No data recorded Speech Volume:No data recorded Handedness:No data recorded  Mood and Affect  Mood:No data recorded Affect:No data recorded  Thought Process  Thought Processes:No data recorded Descriptions of Associations:No data recorded Orientation:No data recorded Thought Content:No data recorded History of Schizophrenia/Schizoaffective disorder:No data recorded Duration of Psychotic Symptoms:No data recorded Hallucinations:No data recorded Ideas of Reference:No data recorded Suicidal Thoughts:No data recorded Homicidal Thoughts:No data recorded  Sensorium  Memory:No data recorded Judgment:No data recorded Insight:No data recorded  Executive Functions  Concentration:No data recorded Attention Span:No data recorded Recall:No data recorded Fund of Knowledge:No data recorded Language:No data recorded  Psychomotor Activity  Psychomotor Activity:No data recorded  Assets  Assets:No data recorded  Sleep  Sleep:No data recorded   Physical Exam: Physical Exam Vitals and nursing note reviewed.  Constitutional:      Appearance: Normal appearance.  HENT:     Head: Normocephalic and atraumatic.     Mouth/Throat:     Pharynx: Oropharynx is clear.  Eyes:     Pupils: Pupils are equal, round, and reactive to light.  Cardiovascular:     Rate and Rhythm: Normal rate and regular rhythm.  Pulmonary:     Effort: Pulmonary effort is normal.     Breath sounds: Normal breath sounds.  Abdominal:     General: Abdomen is flat.     Palpations: Abdomen is soft.  Musculoskeletal:        General: Normal range of motion.  Skin:    General: Skin is warm and dry.  Neurological:     General: No focal deficit present.  Mental Status: She is alert. Mental status is at baseline.  Psychiatric:        Attention and Perception: She is inattentive.        Mood and Affect: Mood is anxious. Affect is labile.        Speech: Speech is tangential.        Behavior: Behavior is agitated. Behavior is not aggressive.        Thought Content: Thought content normal.        Cognition and Memory: Memory is impaired.        Judgment: Judgment is impulsive.    Review of Systems  Constitutional: Negative.   HENT: Negative.    Eyes: Negative.   Respiratory: Negative.    Cardiovascular: Negative.   Gastrointestinal: Negative.   Musculoskeletal: Negative.   Skin: Negative.   Neurological: Negative.   Psychiatric/Behavioral:  Negative for depression, hallucinations, memory loss, substance abuse and suicidal ideas. The patient is nervous/anxious and has insomnia.    Blood pressure 138/75, pulse (!) 107, temperature 98.5 F (36.9 C), temperature source Oral, resp. rate 18, height 4\' 11"  (1.499 m), weight 89.8 kg, last menstrual period 08/15/2008, SpO2 98 %. Body mass index is 39.99 kg/m.   COGNITIVE FEATURES THAT CONTRIBUTE TO RISK:  Thought constriction (tunnel vision)    SUICIDE RISK:   Mild:  Suicidal ideation of limited frequency, intensity, duration, and specificity.  There are no identifiable plans, no associated intent, mild dysphoria and related symptoms, good self-control (both objective and subjective assessment), few other risk factors, and identifiable protective factors, including available and accessible social support.  PLAN OF CARE: Continue 15-minute checks.  Engage in individual and group therapy and assessment.  Attempted get collateral information.  Review medications and make adjustments as appropriate for medical and psychiatric symptoms.  Ongoing assessment of dangerousness prior to discharge.  I certify that inpatient services furnished can reasonably be expected to improve the patient's  condition.   10/13/2008, MD 02/28/2022, 11:52 AM

## 2022-02-28 NOTE — H&P (Signed)
Psychiatric Admission Assessment Adult  Patient Identification: Lauren Velazquez MRN:  [id number] Date of Evaluation:  02/28/2022 Chief Complaint:  Major depressive disorder, recurrent severe without psychotic features (HCC) [F33.2] Principal Diagnosis: Major depressive disorder, recurrent severe without psychotic features (HCC) Diagnosis:  Principal Problem:   Major depressive disorder, recurrent severe without psychotic features (HCC) Active Problems:   Diabetes mellitus type 2, uncomplicated (HCC)   Hyperlipidemia   Hypertension   Acetaminophen overdose, intentional self-harm, initial encounter (HCC)  History of Present Illness: Patient seen and chart reviewed.  61 year old woman who presented to the emergency room with self report of an acute overdose of acetaminophen.  Patient had a blood level that peaked over 200 and required medical admission and acetylcysteine treatment.  She stated at the time that she was trying to kill herself and continues to report that that was the intention at the time although as she points out she or her husband called 911 almost immediately after she took the pills.  Patient says that she was "having a bad day".  Does not get much more specific than that just saying that she has chronic stress from her husband's illness.  Not clear why it may have been worse at that time.  She has consistently denied any suicidal wish since coming into the hospital.  Patient does not appear to be psychotic and is not reporting any hallucinations.  She is extremely focused on somatic complaints which are Prodium and also change seemingly from minute to minute but have a lot to do with complaints of pain in her lower extremities as well as difficulty sleeping at night.  Does not appear to be currently seeing anyone for mental health treatment or on any mental health medication outside the hospital.  She is on a very large amount of prescription medicine related to chronic medical  problems as well as the result of a extended hospitalization at the end of 2022.  Patient denies alcohol or drug abuse but is on chronic high-dose oxycodone for pain. Associated Signs/Symptoms: Depression Symptoms:  insomnia, impaired memory, suicidal attempt, anxiety, Duration of Depression Symptoms: No data recorded (Hypo) Manic Symptoms:  Impulsivity, Irritable Mood, Anxiety Symptoms:  Excessive Worry, Psychotic Symptoms:   None reported PTSD Symptoms: Negative Total Time spent with patient: 1 hour  Past Psychiatric History: Details are little unclear.  I saw this patient in consultation last November when she came into the hospital with an extended stay for a complicated set of symptoms that included spells of agitation.  At that time she was encephalopathic and no specific etiology was ever determined although I had my suspicions about possible overuse of narcotics.  Patient denies having had any problems with drug abuse in the past.  Denies any past suicide attempts.  She was prescribed venlafaxine in the past but does not seem to be still on that medicine.  No report of any history of previous psychotic disorders.  Is the patient at risk to self? Yes.    Has the patient been a risk to self in the past 6 months? Yes.    Has the patient been a risk to self within the distant past? Yes.    Is the patient a risk to others? No.  Has the patient been a risk to others in the past 6 months? No.  Has the patient been a risk to others within the distant past? No.   Prior Inpatient Therapy:   Prior Outpatient Therapy:    Alcohol Screening: Patient refused  Alcohol Screening Tool: Yes 1. How often do you have a drink containing alcohol?: Never 2. How many drinks containing alcohol do you have on a typical day when you are drinking?: 1 or 2 3. How often do you have six or more drinks on one occasion?: Never AUDIT-C Score: 0 4. How often during the last year have you found that you were not  able to stop drinking once you had started?: Never 5. How often during the last year have you failed to do what was normally expected from you because of drinking?: Never 6. How often during the last year have you needed a first drink in the morning to get yourself going after a heavy drinking session?: Never 7. How often during the last year have you had a feeling of guilt of remorse after drinking?: Never 8. How often during the last year have you been unable to remember what happened the night before because you had been drinking?: Never 9. Have you or someone else been injured as a result of your drinking?: No 10. Has a relative or friend or a doctor or another health worker been concerned about your drinking or suggested you cut down?: No Alcohol Use Disorder Identification Test Final Score (AUDIT): 0 Substance Abuse History in the last 12 months:  No. Consequences of Substance Abuse: I have no direct proof of substance abuse although the patient is on high doses of several controlled substances and has had encephalopathy and altered mental status at times. Previous Psychotropic Medications: Yes  Psychological Evaluations: No  Past Medical History:  Past Medical History:  Diagnosis Date   Anemia    not currently under treatment   Anxiety    Asthma    during allergy season   Diabetes mellitus without complication (HCC)    Hypertension    Vertigo     Past Surgical History:  Procedure Laterality Date   CARPAL TUNNEL RELEASE Right 2015   KNEE ARTHROSCOPY Left 05/17/2018   Procedure: ARTHROSCOPY KNEE WITH LATERAL RELEASE, PARTIAL SYNOVECTOMY;  Surgeon: Kennedy Bucker, MD;  Location: [facility];  Service: Orthopedics;  Laterality: Left;   KNEE ARTHROSCOPY WITH LATERAL RELEASE Right 12/28/2017   Procedure: KNEE ARTHROSCOPY WITH LATERAL RELEASE;  Surgeon: Kennedy Bucker, MD;  Location: [facility];  Service: Orthopedics;  Laterality: Right;   TIBIAL TUBERCLERPLASTY Bilateral 06/01/2017    Procedure: TIBIAL TUBERCLE SPUR EXCISION;  Surgeon: Kennedy Bucker, MD;  Location: [facility];  Service: Orthopedics;  Laterality: Bilateral;   Family History:  Family History  Problem Relation Age of Onset   Seizures Sister    Seizures Brother    Family Psychiatric  History: Reportedly family history of seizures no other reported Tobacco Screening:   Social History:  Social History   Substance and Sexual Activity  Alcohol Use No     Social History   Substance and Sexual Activity  Drug Use No    Additional Social History:                           Allergies:   Allergies  Allergen Reactions   Atorvastatin Swelling   Lab Results:  Results for orders placed or performed during the hospital encounter of 02/27/22 (from the past 48 hour(s))  Glucose, capillary     Status: Abnormal   Collection Time: 02/27/22  4:28 PM  Result Value Ref Range   Glucose-Capillary 331 (H) 70 - 99 mg/dL    Comment: Glucose reference range  applies only to samples taken after fasting for at least 8 hours.  Glucose, capillary     Status: Abnormal   Collection Time: 02/27/22  7:42 PM  Result Value Ref Range   Glucose-Capillary 306 (H) 70 - 99 mg/dL    Comment: Glucose reference range applies only to samples taken after fasting for at least 8 hours.  Glucose, capillary     Status: Abnormal   Collection Time: 02/28/22  4:21 AM  Result Value Ref Range   Glucose-Capillary 242 (H) 70 - 99 mg/dL    Comment: Glucose reference range applies only to samples taken after fasting for at least 8 hours.  Glucose, capillary     Status: Abnormal   Collection Time: 02/28/22  7:04 AM  Result Value Ref Range   Glucose-Capillary 213 (H) 70 - 99 mg/dL    Comment: Glucose reference range applies only to samples taken after fasting for at least 8 hours.  Glucose, capillary     Status: Abnormal   Collection Time: 02/28/22  8:26 AM  Result Value Ref Range   Glucose-Capillary 263 (H) 70 - 99 mg/dL    Comment:  Glucose reference range applies only to samples taken after fasting for at least 8 hours.    Blood Alcohol level:  No results found for: "ETH"  Metabolic Disorder Labs:  Lab Results  Component Value Date   HGBA1C 6.7 (H) 02/21/2022   MPG 145.59 02/21/2022   MPG 188.64 06/20/2021   No results found for: "PROLACTIN" Lab Results  Component Value Date   CHOL 197 06/25/2021   TRIG 189 (H) 06/25/2021   HDL 26 (L) 06/25/2021   CHOLHDL 7.6 06/25/2021   VLDL 38 06/25/2021   LDLCALC 133 (H) 06/25/2021    Current Medications: Current Facility-Administered Medications  Medication Dose Route Frequency Provider Last Rate Last Admin   alum & mag hydroxide-simeth (MAALOX/MYLANTA) 200-200-20 MG/5ML suspension 30 mL  30 mL Oral Q4H PRN Charm Rings, NP       amLODipine (NORVASC) tablet 5 mg  5 mg Oral Daily Charm Rings, NP   5 mg at 02/28/22 0829   amoxicillin-clavulanate (AUGMENTIN) 875-125 MG per tablet 1 tablet  1 tablet Oral Q12H Charm Rings, NP   1 tablet at 02/28/22 1601   aspirin chewable tablet 81 mg  81 mg Oral Daily Charm Rings, NP       cyanocobalamin tablet 500 mcg  500 mcg Oral Daily Charm Rings, NP   500 mcg at 02/28/22 0830   diphenhydrAMINE (BENADRYL) injection 12.5 mg  12.5 mg Intravenous Q8H PRN Charm Rings, NP       ezetimibe (ZETIA) tablet 10 mg  10 mg Oral Daily Charm Rings, NP   10 mg at 02/28/22 0932   ferrous sulfate tablet 325 mg  325 mg Oral BID WC Charm Rings, NP   325 mg at 02/28/22 3557   folic acid (FOLVITE) tablet 1 mg  1 mg Oral Daily Charm Rings, NP   1 mg at 02/28/22 3220   hydrALAZINE (APRESOLINE) tablet 25 mg  25 mg Oral Q6H PRN Reggie Pile, MD       hydrocortisone (ANUSOL-HC) 2.5 % rectal cream   Rectal BID Charm Rings, NP       ibuprofen (ADVIL) tablet 200 mg  200 mg Oral Q8H PRN Charm Rings, NP   200 mg at 02/27/22 2007   insulin aspart (novoLOG) injection 0-5 Units  0-5 Units  Subcutaneous QHS Charm Rings, NP    4 Units at 02/27/22 2006   insulin aspart (novoLOG) injection 0-9 Units  0-9 Units Subcutaneous TID WC Charm Rings, NP   5 Units at 02/28/22 0830   insulin aspart (novoLOG) injection 10 Units  10 Units Subcutaneous TID WC Kanan Sobek, Jackquline Denmark, MD       insulin glargine-yfgn (SEMGLEE) injection 140 Units  140 Units Subcutaneous QHS Taisia Fantini T, MD       iron sucrose (VENOFER) 300 mg in sodium chloride 0.9 % 250 mL IVPB  300 mg Intravenous Weekly Schermerhorn, Ihor Austin, MD       labetalol (NORMODYNE) injection 20 mg  20 mg Intravenous Q3H PRN Charm Rings, NP       lacosamide (VIMPAT) tablet 50 mg  50 mg Oral BID Raechelle Sarti, Jackquline Denmark, MD       lactulose (CHRONULAC) 10 GM/15ML solution 20 g  20 g Oral Daily PRN Charm Rings, NP       levETIRAcetam (KEPPRA) tablet 500 mg  500 mg Oral BID Harjit Leider T, MD       magnesium hydroxide (MILK OF MAGNESIA) suspension 30 mL  30 mL Oral Daily PRN Charm Rings, NP       megestrol (MEGACE) tablet 40 mg  40 mg Oral BID Charm Rings, NP   40 mg at 02/28/22 0830   mirtazapine (REMERON) tablet 15 mg  15 mg Oral QHS Riese Hellard T, MD       ondansetron [facility]) tablet 4 mg  4 mg Oral Q8H PRN Charm Rings, NP   4 mg at 02/27/22 2301   oxyCODONE (Oxy IR/ROXICODONE) immediate release tablet 5 mg  5 mg Oral Q4H PRN Charm Rings, NP   5 mg at 02/28/22 1153   pantoprazole (PROTONIX) EC tablet 40 mg  40 mg Oral Q1200 Charm Rings, NP       prazosin (MINIPRESS) capsule 1 mg  1 mg Oral QHS Flois Mctague T, MD       pregabalin (LYRICA) capsule 75 mg  75 mg Oral TID Charm Rings, NP   75 mg at 02/28/22 1153   progesterone (PROMETRIUM) capsule 200 mg  200 mg Oral QHS Charm Rings, NP       traZODone (DESYREL) tablet 25 mg  25 mg Oral QHS PRN Charm Rings, NP   25 mg at 02/27/22 2108   PTA Medications: Medications Prior to Admission  Medication Sig Dispense Refill Last Dose   potassium & sodium phosphates (PHOS-NAK) 280-160-250 MG PACK Take 2  packets by mouth 4 (four) times daily -  before meals and at bedtime for 1 day. 8 packet 0 Past Week   albuterol (VENTOLIN HFA) 108 (90 Base) MCG/ACT inhaler Inhale 2 puffs into the lungs every 6 (six) hours as needed.   prn   amLODipine (NORVASC) 5 MG tablet Take 1 tablet (5 mg total) by mouth daily.   02/22/2022   amoxicillin-clavulanate (AUGMENTIN) 875-125 MG tablet Take 1 tablet by mouth every 12 (twelve) hours for 3 days. 6 tablet 0 02/22/2022   aspirin 81 MG chewable tablet Chew 1 tablet (81 mg total) by mouth daily. 30 tablet 0 02/22/2022   ezetimibe (ZETIA) 10 MG tablet Take 1 tablet (10 mg total) by mouth daily.   02/22/2022   ferrous sulfate 325 (65 FE) MG tablet Take 1 tablet (325 mg total) by mouth 2 (two) times daily with a meal.  3 02/22/2022   folic acid (FOLVITE) 1 MG tablet Take 1 tablet (1 mg total) by mouth daily.   02/22/2022   insulin aspart (NOVOLOG) 100 UNIT/ML injection Inject 12 Units into the skin 3 (three) times daily with meals. (Patient taking differently: Inject 20 Units into the skin 3 (three) times daily with meals.) 10 mL 11 02/22/2022   insulin aspart (NOVOLOG) 100 UNIT/ML injection Use as per following sliding scale: CBG 70 - 120: 0 units CBG 121 - 150: 3 units CBG 151 - 200: 4 units CBG 201 - 250: 7 units CBG 251 - 300: 11 units CBG 301 - 350: 15 units CBG 351 - 400: 20 units and call MD/APP for further instructions 10 mL 11    insulin glargine-yfgn (SEMGLEE) 100 UNIT/ML injection Inject 0.7 mLs (70 Units total) into the skin daily. (Patient taking differently: Inject 80 Units into the skin daily.) 10 mL 11 02/22/2022   insulin glargine-yfgn (SEMGLEE) 100 UNIT/ML injection Inject 1.2 mLs (120 Units total) into the skin at bedtime. (Patient taking differently: Inject 140 Units into the skin at bedtime.) 10 mL 11 02/22/2022   Lacosamide 100 MG TABS Take 1 tablet (100 mg total) by mouth 2 (two) times daily. 60 tablet 0 02/22/2022   lactulose (CHRONULAC) 10 GM/15ML solution Take  30 mLs (20 g total) by mouth daily as needed for mild constipation. 120 mL 0 prn   levETIRAcetam (KEPPRA) 1000 MG tablet Take 1 tablet (1,000 mg total) by mouth 2 (two) times daily.   02/22/2022   lisinopril (ZESTRIL) 10 MG tablet Take 1 tablet (10 mg total) by mouth daily. (Patient not taking: Reported on 02/23/2022)      Menthol-Methyl Salicylate (MUSCLE RUB) 10-15 % CREA Apply 1 application topically as needed for muscle pain.  0 prn   ondansetron (ZOFRAN-ODT) 8 MG disintegrating tablet Take 8 mg by mouth every 8 (eight) hours as needed.   prn   oxyCODONE-acetaminophen (PERCOCET) 10-325 MG tablet Take 1 tablet by mouth every 5 (five) hours as needed for pain. 25 tablet 0 02/23/2022   polyethylene glycol (MIRALAX / GLYCOLAX) 17 g packet Take 17 g by mouth 2 (two) times daily. 14 each 0 02/22/2022   prazosin (MINIPRESS) 1 MG capsule Take 4 mg by mouth at bedtime.   02/22/2022   pregabalin (LYRICA) 75 MG capsule Take 75 mg by mouth 3 (three) times daily.   02/22/2022   progesterone (PROMETRIUM) 200 MG capsule Take 200 mg by mouth at bedtime.   02/22/2022   senna-docusate (SENOKOT-S) 8.6-50 MG tablet Take 2 tablets by mouth 2 (two) times daily.   prn   venlafaxine XR (EFFEXOR-XR) 75 MG 24 hr capsule Take 75 mg by mouth. 75mg  qam and 150mg  qhs   02/23/2022   vitamin B-12 (CYANOCOBALAMIN) 500 MCG tablet Take 1 tablet (500 mcg total) by mouth daily.   02/22/2022    Musculoskeletal: Strength & Muscle Tone: within normal limits Gait & Station: normal Patient leans: N/A            Psychiatric Specialty Exam:  Presentation  General Appearance: No data recorded Eye Contact:No data recorded Speech:No data recorded Speech Volume:No data recorded Handedness:No data recorded  Mood and Affect  Mood:No data recorded Affect:No data recorded  Thought Process  Thought Processes:No data recorded Duration of Psychotic Symptoms: No data recorded Past Diagnosis of Schizophrenia or Psychoactive  disorder: No data recorded Descriptions of Associations:No data recorded Orientation:No data recorded Thought Content:No data recorded Hallucinations:No data recorded Ideas  of Reference:No data recorded Suicidal Thoughts:No data recorded Homicidal Thoughts:No data recorded  Sensorium  Memory:No data recorded Judgment:No data recorded Insight:No data recorded  Executive Functions  Concentration:No data recorded Attention Span:No data recorded Recall:No data recorded Fund of Knowledge:No data recorded Language:No data recorded  Psychomotor Activity  Psychomotor Activity:No data recorded  Assets  Assets:No data recorded  Sleep  Sleep:No data recorded   Physical Exam: Physical Exam Vitals and nursing note reviewed.  Constitutional:      Appearance: Normal appearance.  HENT:     Head: Normocephalic and atraumatic.     Mouth/Throat:     Pharynx: Oropharynx is clear.  Eyes:     Pupils: Pupils are equal, round, and reactive to light.  Cardiovascular:     Rate and Rhythm: Normal rate and regular rhythm.  Pulmonary:     Effort: Pulmonary effort is normal.     Breath sounds: Normal breath sounds.  Abdominal:     General: Abdomen is flat.     Palpations: Abdomen is soft.  Musculoskeletal:        General: Normal range of motion.  Skin:    General: Skin is warm and dry.  Neurological:     General: No focal deficit present.     Mental Status: She is alert. Mental status is at baseline.  Psychiatric:        Attention and Perception: She is inattentive.        Mood and Affect: Mood is anxious.        Speech: Speech is tangential.        Behavior: Behavior is agitated. Behavior is not aggressive.        Thought Content: Thought content normal.        Cognition and Memory: Memory is impaired.        Judgment: Judgment is inappropriate.    Review of Systems  Constitutional: Negative.   HENT: Negative.    Eyes: Negative.   Respiratory: Negative.    Cardiovascular:  Negative.   Gastrointestinal: Negative.   Musculoskeletal: Negative.   Skin: Negative.   Neurological: Negative.   Psychiatric/Behavioral:  Negative for depression, hallucinations, substance abuse and suicidal ideas. The patient is nervous/anxious and has insomnia.    Blood pressure 138/75, pulse (!) 107, temperature 98.5 F (36.9 C), temperature source Oral, resp. rate 18, height 4\' 11"  (1.499 m), weight 89.8 kg, last menstrual period 08/15/2008, SpO2 98 %. Body mass index is 39.99 kg/m.  Treatment Plan Summary: 61 year old woman who presented with overdose of acetaminophen with suicidal intent.  She has remained uncooperative with limited insight and describing details of her recent depression or the reason for the overdose.  Remains very focused on somatic symptoms and requesting discharge.  Patient has multiple chronic medical problems most obviously her diabetes which is poorly controlled.  I will try to see if I can get her back on her usual insulin doses and get some better control.  Patient had been placed on several anticonvulsant medicines at the end of last year despite no direct evidence of seizure activity.  I suspect that this may be contributing to some mental status or mood problems and have started to taper down on both Keppra and the Vimpat.  Tried to get in touch with her husband but was not able to find a number that was working.  The number she gave me also was not connected.  Plan observation and inclusion in groups here at least for a day or 2.  Recommend starting low-dose mirtazapine as she was very focused on sleep and this may be a way to help with depression.  Patient has been complaining of pain first in her feet specifically her right foot then later in her legs complaining of pain in both calves.  Her complaints of pain and distress are not congruent with her behavior or physical presentation.  I ordered a set of Doppler studies for her legs given her past history of DVT.   Patient so far has been uncooperative with that as well because of her insistence on higher dose pain medicine.  Observation Level/Precautions:  15 minute checks  Laboratory:  Chemistry Profile  Psychotherapy:    Medications:    Consultations:    Discharge Concerns:    Estimated LOS:  Other:     Physician Treatment Plan for Primary Diagnosis: Major depressive disorder, recurrent severe without psychotic features (HCC) Long Term Goal(s): Improvement in symptoms so as ready for discharge  Short Term Goals: Ability to verbalize feelings will improve, Ability to disclose and discuss suicidal ideas, and Ability to demonstrate self-control will improve  Physician Treatment Plan for Secondary Diagnosis: Principal Problem:   Major depressive disorder, recurrent severe without psychotic features (HCC) Active Problems:   Diabetes mellitus type 2, uncomplicated (HCC)   Hyperlipidemia   Hypertension   Acetaminophen overdose, intentional self-harm, initial encounter (HCC)  Long Term Goal(s): Improvement in symptoms so as ready for discharge  Short Term Goals: Ability to maintain clinical measurements within normal limits will improve and Compliance with prescribed medications will improve  I certify that inpatient services furnished can reasonably be expected to improve the patient's condition.    Mordecai Rasmussen, MD 7/17/202311:54 AM

## 2022-02-28 NOTE — Progress Notes (Signed)
Pt yelled out from her room at 0406 asking for help. MHT found her on the floor in her room and the patient stated she fell. V/S were taken and were within normal limits except for a HR of 107. Glucose was taken and was trending down from her last check at 1942 but was still above normal limits at 242. Pt didn't report any pain from the fall. Pt irritable with staff, cursing at [place]. Pt was helped up and into her bed. This was an unwitnessed fall.

## 2022-02-28 NOTE — Progress Notes (Signed)
Patient presents tearful tonight stating, "I am worried about my husband, I need to get home to him." Pt given support. Pt more pleasant with staff this evening. Pt compliant with medication administration per MD orders. Pt given education, support, and encouragement to be active in her treatment plan. Pt being monitored Q 15 minutes for safety per unit protocol. Pt remains safe on the unit.

## 2022-02-28 NOTE — Progress Notes (Signed)
Hypoglycemic Event  CBG: 44  Treatment: 8 oz juice/soda  Symptoms: Sweaty  Follow-up CBG: Time: 1818 CBG Result: 76  Possible Reasons for Event: Inadequate meal intake  Comments/MD notified: MD notified at 1806 and informed this writer to hold patient's scheduled Insulin.    Raveen Wieseler

## 2022-02-28 NOTE — Progress Notes (Signed)
Pt continues to be needy and demanding with staff. Pt said she felt like she was having a stroke and needed her V/S take. This Clinical research associate took her V/S and they were within normal limits.

## 2022-03-01 DIAGNOSIS — F332 Major depressive disorder, recurrent severe without psychotic features: Secondary | ICD-10-CM | POA: Diagnosis not present

## 2022-03-01 LAB — GLUCOSE, CAPILLARY
Glucose-Capillary: 44 mg/dL — CL (ref 70–99)
Glucose-Capillary: 76 mg/dL (ref 70–99)
Glucose-Capillary: 170 mg/dL — ABNORMAL HIGH (ref 70–99)
Glucose-Capillary: 176 mg/dL — ABNORMAL HIGH (ref 70–99)
Glucose-Capillary: 118 mg/dL — ABNORMAL HIGH (ref 70–99)

## 2022-03-01 MED ORDER — PRAZOSIN HCL 1 MG PO CAPS: 1.0000 mg | ORAL_CAPSULE | Freq: Every day | ORAL | 0 refills | Status: AC

## 2022-03-01 MED ORDER — MIRTAZAPINE 15 MG PO TABS: 30.0000 mg | ORAL_TABLET | Freq: Every day | ORAL | Status: DC

## 2022-03-01 MED ORDER — LEVETIRACETAM 500 MG PO TABS: 500.0000 mg | ORAL_TABLET | Freq: Two times a day (BID) | ORAL | 0 refills | Status: AC

## 2022-03-01 MED ORDER — MIRTAZAPINE 30 MG PO TABS: 30.0000 mg | ORAL_TABLET | Freq: Every day | ORAL | 1 refills | Status: AC

## 2022-03-01 MED ORDER — PANTOPRAZOLE SODIUM 40 MG PO TBEC: 40.0000 mg | DELAYED_RELEASE_TABLET | Freq: Every day | ORAL | 1 refills | Status: AC

## 2022-03-01 MED ORDER — LACOSAMIDE 50 MG PO TABS: 50.0000 mg | ORAL_TABLET | Freq: Two times a day (BID) | ORAL | 0 refills | Status: AC

## 2022-03-01 NOTE — Progress Notes (Signed)
Patient only takes certain scheduled medications, however, the ones that she refuses are vitamins. MD will be made aware.

## 2022-03-01 NOTE — [facility] Counselor (Signed)
Patient declined to identify an aftercare provider for CSW team to assist with scheduling. CSW to provide walk in information.  Penni Homans, MSW, LCSW 03/01/2022 11:26 AM

## 2022-03-01 NOTE — Discharge Summary (Signed)
Physician Discharge Summary Note  Patient:  Lauren Velazquez is an 61 y.o., female MRN:  [id number] DOB:  07-Sep-1960 Patient phone:  [phone] (home)  Patient address:   [address] [place] [address],  Total Time spent with patient: 30 minutes  Date of Admission:  02/27/2022 Date of Discharge: 03/01/2022  Reason for Admission: Patient was admitted in transfer from the medical service after stabilization for an intentional overdose of acetaminophen requiring hospital treatment.  Patient had admitted multiple times that she had been trying to kill herself.  Principal Problem: Major depressive disorder, recurrent severe without psychotic features [facility]) Discharge Diagnoses: Principal Problem:   Major depressive disorder, recurrent severe without psychotic features (HCC) Active Problems:   Diabetes mellitus type 2, uncomplicated (HCC)   Hyperlipidemia   Hypertension   Acetaminophen overdose, intentional self-harm, initial encounter [facility])   Past Psychiatric History: Past history of mood instability depression.  Unclear if there have been previous suicide attempts as she is not a very reliable historian.  Past Medical History:  Past Medical History:  Diagnosis Date   Anemia    not currently under treatment   Anxiety    Asthma    during allergy season   Diabetes mellitus without complication (HCC)    Hypertension    Vertigo     Past Surgical History:  Procedure Laterality Date   CARPAL TUNNEL RELEASE Right 2015   KNEE ARTHROSCOPY Left 05/17/2018   Procedure: ARTHROSCOPY KNEE WITH LATERAL RELEASE, PARTIAL SYNOVECTOMY;  Surgeon: Kennedy Bucker, MD;  Location: [facility] ORS;  Service: Orthopedics;  Laterality: Left;   KNEE ARTHROSCOPY WITH LATERAL RELEASE Right 12/28/2017   Procedure: KNEE ARTHROSCOPY WITH LATERAL RELEASE;  Surgeon: Kennedy Bucker, MD;  Location: [facility] ORS;  Service: Orthopedics;  Laterality: Right;   TIBIAL TUBERCLERPLASTY Bilateral 06/01/2017   Procedure:  TIBIAL TUBERCLE SPUR EXCISION;  Surgeon: Kennedy Bucker, MD;  Location: [facility] ORS;  Service: Orthopedics;  Laterality: Bilateral;   Family History:  Family History  Problem Relation Age of Onset   Seizures Sister    Seizures Brother    Family Psychiatric  History: Reportedly seizures in the family Social History:  Social History   Substance and Sexual Activity  Alcohol Use No     Social History   Substance and Sexual Activity  Drug Use No    Social History   Socioeconomic History   Marital status: Married    Spouse name: Not on file   Number of children: Not on file   Years of education: Not on file   Highest education level: Not on file  Occupational History   Not on file  Tobacco Use   Smoking status: Never   Smokeless tobacco: Never  Vaping Use   Vaping Use: Never used  Substance and Sexual Activity   Alcohol use: No   Drug use: No   Sexual activity: Not on file  Other Topics Concern   Not on file  Social History Narrative   Not on file   Social Determinants of Health   Financial Resource Strain: Not on file  Food Insecurity: Not on file  Transportation Needs: Not on file  Physical Activity: Not on file  Stress: Not on file  Social Connections: Not on file    Hospital Course: Patient seen and chart reviewed.  Patient was admitted in transfer from the medical service for treatment of psychiatric symptoms after an intentional overdose of acetaminophen.  During her stay on the psychiatric unit patient consistently denied any suicidal ideation.  She minimized the severity of her behavior.  Initially she was extremely focused on somatic symptoms which were incoherent at times and inconsistent with physical findings.  Also would change from minute to minute.  Patient is on a very large diversity and number of medications for her various medical diagnoses a lot of them related to the extended hospitalization she had last year.  Reviewing her medications and her past  history it appeared to me that there was a lack of direct evidence that she had a seizure disorder and yet she was continuing to be on pretty high doses of a couple different medicines for epilepsy.  I have decreased both the Keppra and the Vimpat to decrease side effects.  No sign of any withdrawal or complication from that.  She also has been given a much decreased amount of narcotic compared to her usual prescriptions and has shown no signs of withdrawal related to that.  She was started on mirtazapine to assist with mood symptoms.  At the time of discharge patient is upbeat lucid calm completely denying suicidal ideation.  She agrees to outpatient treatment.  I reviewed with her that several of her medicines had been decreased or changed.  These will be noted on her discharge prescriptions.  Patient is to follow-up with outpatient provider and will be given mental health referrals as well.  Physical Findings: AIMS:  , ,  ,  ,    CIWA:    COWS:     Musculoskeletal: Strength & Muscle Tone: within normal limits Gait & Station: normal Patient leans: N/A   Psychiatric Specialty Exam:  Presentation  General Appearance: No data recorded Eye Contact:No data recorded Speech:No data recorded Speech Volume:No data recorded Handedness:No data recorded  Mood and Affect  Mood:No data recorded Affect:No data recorded  Thought Process  Thought Processes:No data recorded Descriptions of Associations:No data recorded Orientation:No data recorded Thought Content:No data recorded History of Schizophrenia/Schizoaffective disorder:No data recorded Duration of Psychotic Symptoms:No data recorded Hallucinations:No data recorded Ideas of Reference:No data recorded Suicidal Thoughts:No data recorded Homicidal Thoughts:No data recorded  Sensorium  Memory:No data recorded Judgment:No data recorded Insight:No data recorded  Executive Functions  Concentration:No data recorded Attention Span:No  data recorded Recall:No data recorded Fund of Knowledge:No data recorded Language:No data recorded  Psychomotor Activity  Psychomotor Activity:No data recorded  Assets  Assets:No data recorded  Sleep  Sleep:No data recorded   Physical Exam: Physical Exam Vitals and nursing note reviewed.  Constitutional:      Appearance: Normal appearance.  HENT:     Head: Normocephalic and atraumatic.     Mouth/Throat:     Pharynx: Oropharynx is clear.  Eyes:     Pupils: Pupils are equal, round, and reactive to light.  Cardiovascular:     Rate and Rhythm: Normal rate and regular rhythm.  Pulmonary:     Effort: Pulmonary effort is normal.     Breath sounds: Normal breath sounds.  Abdominal:     General: Abdomen is flat.     Palpations: Abdomen is soft.  Musculoskeletal:        General: Normal range of motion.  Skin:    General: Skin is warm and dry.  Neurological:     General: No focal deficit present.     Mental Status: She is alert. Mental status is at baseline.  Psychiatric:        Attention and Perception: Attention normal.        Mood and Affect: Mood normal.  Speech: Speech normal.        Behavior: Behavior is cooperative.        Thought Content: Thought content normal.        Cognition and Memory: Cognition normal.        Judgment: Judgment normal.    Review of Systems  Constitutional: Negative.   HENT: Negative.    Eyes: Negative.   Respiratory: Negative.    Cardiovascular: Negative.   Gastrointestinal: Negative.   Musculoskeletal: Negative.   Skin: Negative.   Neurological: Negative.   Psychiatric/Behavioral: Negative.     Blood pressure 120/69, pulse 83, temperature 97.9 F (36.6 C), temperature source Oral, resp. rate 16, height 4\' 11"  (1.499 m), weight 89.8 kg, last menstrual period 08/15/2008, SpO2 98 %. Body mass index is 39.99 kg/m.   Social History   Tobacco Use  Smoking Status Never  Smokeless Tobacco Never   Tobacco Cessation:  A  prescription for an FDA-approved tobacco cessation medication was offered at discharge and the patient refused   Blood Alcohol level:  No results found for: "ETH"  Metabolic Disorder Labs:  Lab Results  Component Value Date   HGBA1C 6.7 (H) 02/21/2022   MPG 145.59 02/21/2022   MPG 188.64 06/20/2021   No results found for: "PROLACTIN" Lab Results  Component Value Date   CHOL 197 06/25/2021   TRIG 189 (H) 06/25/2021   HDL 26 (L) 06/25/2021   CHOLHDL 7.6 06/25/2021   VLDL 38 06/25/2021   LDLCALC 133 (H) 06/25/2021    See Psychiatric Specialty Exam and Suicide Risk Assessment completed by Attending Physician prior to discharge.  Discharge destination:  Home  Is patient on multiple antipsychotic therapies at discharge:  No   Has Patient had three or more failed trials of antipsychotic monotherapy by history:  No  Recommended Plan for Multiple Antipsychotic Therapies: NA  Discharge Instructions     Diet - low sodium heart healthy   Complete by: As directed    Increase activity slowly   Complete by: As directed       Allergies as of 03/01/2022       Reactions   Atorvastatin Swelling        Medication List     STOP taking these medications    amoxicillin-clavulanate 875-125 MG tablet Commonly known as: AUGMENTIN   folic acid 1 MG tablet Commonly known as: FOLVITE   lactulose 10 GM/15ML solution Commonly known as: CHRONULAC   lisinopril 10 MG tablet Commonly known as: ZESTRIL   Muscle Rub 10-15 % Crea   ondansetron 8 MG disintegrating tablet Commonly known as: ZOFRAN-ODT   polyethylene glycol 17 g packet Commonly known as: MIRALAX / GLYCOLAX   potassium & sodium phosphates 280-160-250 MG Pack Commonly known as: PHOS-NAK   senna-docusate 8.6-50 MG tablet Commonly known as: Senokot-S   venlafaxine XR 75 MG 24 hr capsule Commonly known as: EFFEXOR-XR       TAKE these medications      Indication  albuterol 108 (90 Base) MCG/ACT  inhaler Commonly known as: VENTOLIN HFA Inhale 2 puffs into the lungs every 6 (six) hours as needed.  Indication: Asthma   amLODipine 5 MG tablet Commonly known as: NORVASC Take 1 tablet (5 mg total) by mouth daily.  Indication: High Blood Pressure Disorder   aspirin 81 MG chewable tablet Chew 1 tablet (81 mg total) by mouth daily.  Indication: Temporary Stroke   cyanocobalamin 500 MCG tablet Commonly known as: CYANOCOBALAMIN Take 1 tablet (500 mcg total) by  mouth daily.  Indication: Inadequate Vitamin B12   ezetimibe 10 MG tablet Commonly known as: ZETIA Take 1 tablet (10 mg total) by mouth daily.  Indication: High Amount of Fats in the Blood   ferrous sulfate 325 (65 FE) MG tablet Take 1 tablet (325 mg total) by mouth 2 (two) times daily with a meal.  Indication: Anemia From Inadequate Iron in the Body   insulin aspart 100 UNIT/ML injection Commonly known as: novoLOG Inject 12 Units into the skin 3 (three) times daily with meals. What changed:  how much to take Another medication with the same name was removed. Continue taking this medication, and follow the directions you see here.  Indication: Type 2 Diabetes   insulin glargine-yfgn 100 UNIT/ML injection Commonly known as: SEMGLEE Inject 1.2 mLs (120 Units total) into the skin at bedtime. What changed:  how much to take Another medication with the same name was removed. Continue taking this medication, and follow the directions you see here.  Indication: Type 2 Diabetes   lacosamide 50 MG Tabs tablet Commonly known as: VIMPAT Take 1 tablet (50 mg total) by mouth 2 (two) times daily. What changed:  medication strength how much to take  Indication: Partial Onset Seizure, Dose decreased from previous dose.  Suggest consideration of discontinuation taper   levETIRAcetam 500 MG tablet Commonly known as: KEPPRA Take 1 tablet (500 mg total) by mouth 2 (two) times daily. What changed:  medication strength how much  to take  Indication: Seizure, Dose decreased from previous dosage.  Suggest considering discontinuation taper   mirtazapine 30 MG tablet Commonly known as: REMERON Take 1 tablet (30 mg total) by mouth at bedtime.  Indication: Major Depressive Disorder   oxyCODONE-acetaminophen 10-325 MG tablet Commonly known as: PERCOCET Take 1 tablet by mouth every 5 (five) hours as needed for pain.  Indication: Pain   pantoprazole 40 MG tablet Commonly known as: PROTONIX Take 1 tablet (40 mg total) by mouth daily at 12 noon.  Indication: Gastroesophageal Reflux Disease   prazosin 1 MG capsule Commonly known as: MINIPRESS Take 1 capsule (1 mg total) by mouth at bedtime. What changed: how much to take  Indication: Frightening Dreams, This should replace the previous dose of 4 mg, not add to it   pregabalin 75 MG capsule Commonly known as: LYRICA Take 75 mg by mouth 3 (three) times daily.  Indication: Fibromyalgia Syndrome   progesterone 200 MG capsule Commonly known as: PROMETRIUM Take 200 mg by mouth at bedtime.  Indication: Overgrowth of the Uterine Lining         Follow-up recommendations: Some prescriptions provided at discharge but I did not replicate all of her outpatient medications choosing not to replicate things that she is already taking.  She has been given a much decreased amount of narcotic but I did not give her a new narcotic prescription.  She will be given a prescription for mirtazapine at a dose of 30 mg at night for depression and anxiety.  Strongly encouraged to talk with her primary care doctor about her dosages and to accept referral to outpatient mental health care  Comments: See above  Signed: Mordecai Rasmussen, MD 03/01/2022, 10:26 AM

## 2022-03-01 NOTE — Group Note (Signed)
[facility] LCSW Group Therapy Note   Group Date: 03/01/2022 Start Time: 1300 End Time: 1400  Type of Therapy/Topic:  Group Therapy:  Feelings about Diagnosis  Participation Level:  Did Not Attend    Description of Group:    This group will allow patients to explore their thoughts and feelings about diagnoses they have received. Patients will be guided to explore their level of understanding and acceptance of these diagnoses. Facilitator will encourage patients to process their thoughts and feelings about the reactions of others to their diagnosis, and will guide patients in identifying ways to discuss their diagnosis with significant others in their lives. This group will be process-oriented, with patients participating in exploration of their own experiences as well as giving and receiving support and challenge from other group members.   Therapeutic Goals: 1. Patient will demonstrate understanding of diagnosis as evidence by identifying two or more symptoms of the disorder:  2. Patient will be able to express two feelings regarding the diagnosis 3. Patient will demonstrate ability to communicate their needs through discussion and/or role plays  Summary of Patient Progress: X   Therapeutic Modalities:   Cognitive Behavioral Therapy Brief Therapy Feelings Identification    Glenis Smoker, LCSW

## 2022-03-01 NOTE — [facility] Suicide Risk Assessment (Signed)
[facility] INPATIENT:  Family/Significant Other Suicide Prevention Education  Suicide Prevention Education:  Patient Refusal for Family/Significant Other Suicide Prevention Education: The patient Lauren Velazquez has refused to provide written consent for family/significant other to be provided Family/Significant Other Suicide Prevention Education during admission and/or prior to discharge.  Physician notified.  SPE completed with pt, as pt refused to consent to family contact. SPI pamphlet provided to pt and pt was encouraged to share information with support network, ask questions, and talk about any concerns relating to SPE. Pt denies access to guns/firearms and verbalized understanding of information provided. Mobile Crisis information also provided to pt.  Glenis Smoker 03/01/2022, 10:17 AM

## 2022-03-01 NOTE — [facility] Suicide Risk Assessment (Signed)
[facility] Discharge Suicide Risk Assessment   Principal Problem: Major depressive disorder, recurrent severe without psychotic features (HCC) Discharge Diagnoses: Principal Problem:   Major depressive disorder, recurrent severe without psychotic features (HCC) Active Problems:   Diabetes mellitus type 2, uncomplicated (HCC)   Hyperlipidemia   Hypertension   Acetaminophen overdose, intentional self-harm, initial encounter (HCC)   Total Time spent with patient: 30 minutes  Musculoskeletal: Strength & Muscle Tone: within normal limits Gait & Station: normal Patient leans: N/A  Psychiatric Specialty Exam  Presentation  General Appearance: No data recorded Eye Contact:No data recorded Speech:No data recorded Speech Volume:No data recorded Handedness:No data recorded  Mood and Affect  Mood:No data recorded Duration of Depression Symptoms: No data recorded Affect:No data recorded  Thought Process  Thought Processes:No data recorded Descriptions of Associations:No data recorded Orientation:No data recorded Thought Content:No data recorded History of Schizophrenia/Schizoaffective disorder:No data recorded Duration of Psychotic Symptoms:No data recorded Hallucinations:No data recorded Ideas of Reference:No data recorded Suicidal Thoughts:No data recorded Homicidal Thoughts:No data recorded  Sensorium  Memory:No data recorded Judgment:No data recorded Insight:No data recorded  Executive Functions  Concentration:No data recorded Attention Span:No data recorded Recall:No data recorded Fund of Knowledge:No data recorded Language:No data recorded  Psychomotor Activity  Psychomotor Activity:No data recorded  Assets  Assets:No data recorded  Sleep  Sleep:No data recorded  Physical Exam: Physical Exam Constitutional:      Appearance: Normal appearance.  HENT:     Head: Normocephalic and atraumatic.     Mouth/Throat:     Pharynx: Oropharynx is clear.  Eyes:      Pupils: Pupils are equal, round, and reactive to light.  Cardiovascular:     Rate and Rhythm: Normal rate and regular rhythm.  Pulmonary:     Effort: Pulmonary effort is normal.     Breath sounds: Normal breath sounds.  Abdominal:     General: Abdomen is flat.     Palpations: Abdomen is soft.  Musculoskeletal:        General: Normal range of motion.  Skin:    General: Skin is warm and dry.  Neurological:     General: No focal deficit present.     Mental Status: She is alert. Mental status is at baseline.  Psychiatric:        Mood and Affect: Mood normal.        Thought Content: Thought content normal.    Review of Systems  Constitutional: Negative.   HENT: Negative.    Eyes: Negative.   Respiratory: Negative.    Cardiovascular: Negative.   Gastrointestinal: Negative.   Musculoskeletal: Negative.   Skin: Negative.   Neurological: Negative.   Psychiatric/Behavioral: Negative.     Blood pressure 120/69, pulse 83, temperature 97.9 F (36.6 C), temperature source Oral, resp. rate 16, height 4\' 11"  (1.499 m), weight 89.8 kg, last menstrual period 08/15/2008, SpO2 98 %. Body mass index is 39.99 kg/m.  Mental Status Per Nursing Assessment::   On Admission:  NA  Demographic Factors:  Caucasian  Loss Factors: Decline in physical health  Historical Factors: Impulsivity  Risk Reduction Factors:   Sense of responsibility to family, Living with another person, especially a relative, and Positive therapeutic relationship  Continued Clinical Symptoms:  Depression:   Impulsivity Medical Diagnoses and Treatments/Surgeries  Cognitive Features That Contribute To Risk:  None    Suicide Risk:  Minimal: No identifiable suicidal ideation.  Patients presenting with no risk factors but with morbid ruminations; may be classified as minimal risk based on  the severity of the depressive symptoms    Plan Of Care/Follow-up recommendations:  Patient will be given referral for  outpatient mental health treatment and is also encouraged to continue follow-up with primary care doctor.  Reviewed with patient new antidepressant prescription as well as changes to her medications.  Patient is denying suicidal ideation and shows a pleasant upbeat affect with positive plans for the future.  Based on her history and chronic behavior I think she remains at chronically elevated risk for impulsive behavior including self-harm but is now back to baseline with no further need for inpatient treatment  Mordecai Rasmussen, MD 03/01/2022, 10:17 AM

## 2022-03-01 NOTE — [facility] Counselor (Signed)
CSW met with pt briefly regarding discharge. Pt reports that she will be returning home but will need assistance with transportation. Pt and CSW discussed follow up. She states that she has not been seeing anyone for mental health but would like to get resources for them to review. CSW agreed to give her these. She denied any tobacco or substance use. CSW asked about any need for clothes. Pt stated that she has pants in the dirty clothes but needs to wash them. No other concerns expressed. Contact ended without incident.   CSW gave pt resources for therapists/psychiatrists who accept [employer] for review and to let CSW know about the ones she is interested in.   Chalmers Guest. Guerry Bruin, MSW, [place], [place] 03/01/2022 10:25 AM

## 2022-03-01 NOTE — [facility] Counselor (Signed)
Adult Comprehensive Assessment  Patient ID: Lauren Velazquez, female   DOB: 12-27-1960, 61 y.o.   MRN: [id number]  Information Source: Information source: Patient  Current Stressors:  Patient states their primary concerns and needs for treatment are:: "I took 45 tylenol." Patient states their goals for this hospitilization and ongoing recovery are:: When asked about her goals for hospitalization, pt states, "I've been here for close to a week and I think I've figured them out."  Living/Environment/Situation:  Living Arrangements: Spouse/significant other  Family History:     Childhood History:     Education:     Employment/Work Situation:      Pensions consultant:      Alcohol/Substance Abuse:      Social Support System:      Leisure/Recreation:      Strengths/Needs:      Discharge Plan:      Summary/Recommendations:   Architectural technologist and Recommendations (to be completed by the evaluator): Patient is a 61 year old, married, female [facility] [place] [place] [facility] She stated that she is here because she "took 45 Tylenol". She stated at the time that she was trying to kill herself and continues to report that that was the intention at the time although as she points out she or her husband called 911 almost immediately after she took the pills. Pt expressed that she feels she has met her goals during her week long stay in the hospital. She reported need to return home due to her husband who blind for whom she cares, which may add to pt stress level. Due to pt discharge today, recommendations include medication compliance, connection to outpatient provider, and following through with stated aftercare plans.  Shirl Harris. 03/01/2022

## 2022-03-01 NOTE — Progress Notes (Signed)
Recreation Therapy Notes  Date: 03/01/2022  Time: 10:20 am    Location: Courtyard       Behavioral response: N/A   Intervention Topic: Leisure    Discussion/Intervention: Patient refused to attend group.   Clinical Observations/Feedback:  Patient refused to attend group.    Soledad Budreau LRT/CTRS          Omid Deardorff 03/01/2022 11:52 AM

## 2022-03-01 NOTE — Progress Notes (Signed)
  [facility] Adult Case Management Discharge Plan :  Will you be returning to the same living situation after discharge:  Yes,  pt reports that she is returning home. At discharge, do you have transportation home?: Yes,  CSW to assist with transportation needs.  Do you have the ability to pay for your medications: Yes,  [employer].  Release of information consent forms completed and in the chart;  Patient's signature needed at discharge.  Patient to Follow up at:  Follow-up Information     [facility] Follow up.   Why: They are available for crisir services and walk ins Monday through Friday 8AM to 4:30PM. Contact information: [address] [place] [address] [phone]         [facility] Follow up.   Why: You will need to call this provider to set up an appointment if interested. Contact information: [address] [place] [address]        [facility] Follow up.   Specialty: Behavioral Health Why: You will need to call this provider to set up an appointment if interested. Contact information: [address] [facility] [place] [address] [phone]                Next level of care provider has access to [facility] Link:no  Safety Planning and Suicide Prevention discussed: Yes,  SPE completed with the patient.      Has patient been referred to the Quitline?: Patient refused referral  Patient has been referred for addiction treatment: Pt. refused referral  Harden Mo, LCSW 03/01/2022, 11:29 AM

## 2022-03-01 NOTE — Progress Notes (Signed)
Patient ID: Lauren Velazquez, female   DOB: May 10, 1961, 61 y.o.   MRN: [id number]  Discharge Note:  Patient denies SI/HI/AVH at this time. Discharge instructions, AVS, prescriptions, and transition recor gone over with patient. Patient agrees to comply with medication management. Patient declined to go over any follow-up information with social work, so she was provided information on follow-up. Patient belongings returned to patient. Patient questions and concerns addressed and answered. Patient wheeled off unit. Patient discharged to home via [employer] services.

## 2022-03-01 NOTE — Progress Notes (Signed)
D- Patient alert and oriented. Patient presented in a preoccupied, but pleasant mood on assessment stating that she slept better last night "I slept better than I have in ten years". Patient continues to endorse bilateral foot pain, rating it a "9/10", in which she did request PRN medication from this Clinical research associate. Patient denies depression, but stated that "I'm anxious about losing my house if I don't get home. I ain't gonna have nothing". Patient also denies SI, HI, AVH at this time. Patient had no stated goals for today, just ready to go home.  A- Some of patient's scheduled medications administered to patient, per MD orders. Support and encouragement provided.  Routine safety checks conducted every 15 minutes.  Patient informed to notify staff with problems or concerns.  R- No adverse drug reactions noted. Patient contracts for safety at this time. Patient compliant with medications and treatment plan. Patient receptive, calm, and cooperative. Patient interacts well with others on the unit.  Patient remains safe at this time.

## 2022-03-08 NOTE — Discharge Summary (Signed)
Physician AMA Discharge Summary   Lauren Velazquez  female DOB: February 10, 1961  YSH:[id number]  PCP: Hillery Aldo, MD  Admit date: 02/20/2022 Leaving AMA date: 02/21/2022  CODE STATUS: Full code   Hospital Course:  For full details, please see H&P, progress notes, consult notes and ancillary notes.  Briefly,  Lauren Velazquez is a 61 y.o. female with a past medical history of hypertension, obesity, hypertension, asthma, diabetes mellitus type 2, seizure disorder who presented to the emergency department with nausea and vomiting for the past 3 to 4 days.  Mostly dry heaving but occasional active emesis that is nonbloody nonbilious. Some epigastric abdominal pain.  No significant diarrhea.    Nausea, vomiting, abdominal pain:  Unclear etiology.  CTA c/a/p neg for acute finding.  Lipase level unremarkable.  Differential includes diabetic gastroparesis versus viral gastroenteritis, or constipation. --Started on scheduled IV Reglan on admission and anti-emetics PRN.   --pt was on clear liquid diet, started treatment for constipation, but left AMA during night shift.   Constipation --also had a ED presentation for constipation on 02/09/22.  Likely due to large amount of opioids use.  Current CT scan reviewed by myself, showed stool in almost the entire colon.  This could be causing some of pt's symptoms. --started on aggressive Miralax 34 g q2h, however, pt left before finishing treatment. --pt was unwilling to receive Enema   Chronic pain on chronic opioids --has Rx for Percocet 10 for 5 times per day --cont Percocet 10 q4h PRN --No extra opioids, no IV opioids   Hypertensive urgency:  BP elevated on presentation. Continue home lisinopril, amlodipine and prazosin   Insulin-dependent diabetes mellitus type 2:  --On insulin glargine 180 units twice daily (not sure if this is accurate as this sounds like a tremendous amount of insulin).   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  Discharge Diagnoses:  Principal Problem:   Refractory nausea and vomiting   30 Day Unplanned Readmission Risk Score    Flowsheet Row Admission (Discharged) from 06/30/2021 in [facility] [place] [facility]  30 Day Unplanned Readmission Risk Score (%) 34.03 Filed at 07/20/2021 1200       This score is the patient's risk of an unplanned readmission within 30 days of being discharged (0 -100%). The score is based on dignosis, age, lab data, medications, orders, and past utilization.   Low:  0-14.9   Medium: 15-21.9   High: 22-29.9   Extreme: 30 and above         Discharge Instructions:  Allergies as of 02/21/2022       Reactions   Atorvastatin Swelling        Medication List     ASK your doctor about these medications    albuterol 108 (90 Base) MCG/ACT inhaler Commonly known as: VENTOLIN HFA Inhale 2 puffs into the lungs every 6 (six) hours as needed.   amLODipine 5 MG tablet Commonly known as: NORVASC Take 1 tablet (5 mg total) by mouth daily.   cyanocobalamin 500 MCG tablet Commonly known as: CYANOCOBALAMIN Take 1 tablet (500 mcg total) by mouth daily.   ezetimibe 10 MG tablet Commonly known as: ZETIA Take 1 tablet (  10 mg total) by mouth daily.   ferrous sulfate 325 (65 FE) MG tablet Take 1 tablet (325 mg total) by mouth 2 (two) times daily with a meal.   insulin aspart 100 UNIT/ML injection Commonly known as: novoLOG Inject 12 Units into the skin 3 (three) times daily with meals.   oxyCODONE-acetaminophen 10-325 MG tablet Commonly known as: PERCOCET Take 1 tablet by mouth every 5 (five) hours as needed for pain.   pregabalin 75 MG capsule Commonly known as: LYRICA Take  75 mg by mouth 3 (three) times daily.   progesterone 200 MG capsule Commonly known as: PROMETRIUM Take 200 mg by mouth at bedtime.          Allergies  Allergen Reactions   Atorvastatin Swelling     The results of significant diagnostics from this hospitalization (including imaging, microbiology, ancillary and laboratory) are listed below for reference.   Consultations:   Procedures/Studies: US Venous Img Lower Unilateral Right (DVT)  Result Date: 02/28/2022 CLINICAL DATA:  Bilateral lower extremity pain. History of previous DVT. Evaluate for acute or chronic DVT. EXAM: BILATERAL LOWER EXTREMITY VENOUS DOPPLER ULTRASOUND TECHNIQUE: Gray-scale sonography with graded compression, as well as color Doppler and duplex ultrasound were performed to evaluate the lower extremity deep venous systems from the level of the common femoral vein and including the common femoral, femoral, profunda femoral, popliteal and calf veins including the posterior tibial, peroneal and gastrocnemius veins when visible. The superficial great saphenous vein was also interrogated. Spectral Doppler was utilized to evaluate flow at rest and with distal augmentation maneuvers in the common femoral, femoral and popliteal veins. COMPARISON:  Right lower extremity venous Doppler ultrasound-09/15/2021 (negative). FINDINGS: RIGHT LOWER EXTREMITY Grayscale compression images of the right common femoral vein, the saphenofemoral junction, the deep femoral vein, the superficial femoral vein and the popliteal vein demonstrates normal compressibility. The patient refused the remainder of the bilateral lower extremity venous Doppler ultrasound. LEFT LOWER EXTREMITY Patient refused evaluation of the left lower extremity. IMPRESSION: 1. Normal compressibility of the right lower extremity venous system to the level of the right popliteal vein. 2. Patient refused the remainder of the bilateral lower extremity venous Doppler ultrasound.  Electronically Signed   By: Simonne Come M.D.   On: 02/28/2022 11:47   CT ABDOMEN PELVIS W CONTRAST  Result Date: 02/23/2022 CLINICAL DATA:  Abdominal pain EXAM: CT ABDOMEN AND PELVIS WITH CONTRAST TECHNIQUE: Multidetector CT imaging of the abdomen and pelvis was performed using the standard protocol following bolus administration of intravenous contrast. RADIATION DOSE REDUCTION: This exam was performed according to the departmental dose-optimization program which includes automated exposure control, adjustment of the mA and/or kV according to patient size and/or use of iterative reconstruction technique. CONTRAST:  OMNIPAQUE IOHEXOL 300 MG/ML SOLN IV. No oral contrast. COMPARISON:  02/20/2022 FINDINGS: Lower chest: Minimal bibasilar atelectasis. Hepatobiliary: Peripherally calcified gallbladder versus large calcified gallstone within gallbladder. Liver unremarkable. Pancreas: Atrophic pancreas without mass Spleen: Normal appearance.  Small adjacent splenule. Adrenals/Urinary Tract: Small nonobstructing LEFT renal calculus. Small exophytic RIGHT renal cyst 16 mm diameter unchanged; no follow-up imaging recommended adrenal glands, kidneys, ureters, and bladder otherwise normal appearance. Stomach/Bowel: Normal appendix. Stomach and bowel loops normal appearance Vascular/Lymphatic: Atherosclerotic calcifications aorta without aneurysm. No adenopathy. Reproductive: Probable 2 cm leiomyoma within uterus upper LEFT. LEFT ovarian cyst 5.1 x 4.6 cm not significantly changed; this is a simple appearing cyst and follow-up ultrasound is recommended in 6-12 months. RIGHT adnexa unremarkable. Other: Tiny umbilical hernia containing fat.  No free air or free fluid. No inflammatory process. Musculoskeletal: Unremarkable IMPRESSION: Small nonobstructing LEFT renal calculus. Probable 2 cm leiomyoma within uterus. Peripherally calcified gallbladder versus large calcified gallstone within gallbladder. Tiny umbilical hernia  containing fat. Grossly stable simple appearing LEFT ovarian cyst 5.1 x 4.6 cm; follow-up ultrasound recommended in 6-12 months. No acute intra-abdominal or intrapelvic abnormalities. Aortic Atherosclerosis (ICD10-I70.0). Electronically Signed   By: Ulyses Southward M.D.   On: 02/23/2022 10:08   US Abdomen Limited RUQ (LIVER/GB)  Result Date: 02/20/2022 CLINICAL DATA:  Nausea, vomiting EXAM: ULTRASOUND ABDOMEN LIMITED RIGHT UPPER QUADRANT COMPARISON:  None Available. FINDINGS: Gallbladder: Incompletely imaged. Common bile duct: Incompletely imaged Liver: No focal lesion identified. Heterogeneously increased parenchymal echogenicity. Portal vein is patent on color Doppler imaging with normal direction of blood flow towards the liver. Other: None. IMPRESSION: 1. Very limited examination. By sonographer report, patient declined to complete examination. 2.  Hepatic steatosis. Electronically Signed   By: Jearld Lesch M.D.   On: 02/20/2022 16:04   CT Angio Chest/Abd/Pel for Dissection W and/or Wo Contrast  Result Date: 02/20/2022 CLINICAL DATA:  Clinical suspicion for acute aortic syndrome. Patient presents with chest pain, nausea vomiting and weakness. EXAM: CT ANGIOGRAPHY CHEST, ABDOMEN AND PELVIS TECHNIQUE: Non-contrast CT of the chest was initially obtained. Multidetector CT imaging through the chest, abdomen and pelvis was performed using the standard protocol during bolus administration of intravenous contrast. Multiplanar reconstructed images and MIPs were obtained and reviewed to evaluate the vascular anatomy. RADIATION DOSE REDUCTION: This exam was performed according to the departmental dose-optimization program which includes automated exposure control, adjustment of the mA and/or kV according to patient size and/or use of iterative reconstruction technique. CONTRAST:  OMNIPAQUE IOHEXOL 350 MG/ML SOLN COMPARISON:  Current chest radiograph. CT abdomen and pelvis dated 06/20/2021. FINDINGS: CTA CHEST  FINDINGS Cardiovascular: Thoracic aorta is normal in caliber. No dissection. Minimal descending thoracic aortic atherosclerosis. Arch branch vessels are widely patent. Heart normal in size and configuration. Minor left coronary artery calcifications. No pericardial effusion. Mediastinum/Nodes: No enlarged mediastinal, hilar, or axillary lymph nodes. Thyroid gland, trachea, and esophagus demonstrate no significant findings. Lungs/Pleura: Lungs are clear. No pleural effusion or pneumothorax. Musculoskeletal: No fracture or acute finding. No bone lesion. No chest wall mass Review of the MIP images confirms the above findings. CTA ABDOMEN AND PELVIS FINDINGS VASCULAR Aorta: Minor atherosclerosis. No aneurysm. No dissection. No significant stenosis. Celiac: Patent without evidence of aneurysm, dissection, vasculitis or significant stenosis. SMA: Patent without evidence of aneurysm, dissection, vasculitis or significant stenosis. Renals: Both renal arteries are patent without evidence of aneurysm, dissection, vasculitis, fibromuscular dysplasia or significant stenosis. IMA: Patent without evidence of aneurysm, dissection, vasculitis or significant stenosis. Inflow: Patent without evidence of aneurysm, dissection, vasculitis or significant stenosis. Veins: No obvious venous abnormality within the limitations of this arterial phase study. Review of the MIP images confirms the above findings. NON-VASCULAR Hepatobiliary: Unremarkable liver. Peripheral calcification along the gallbladder wall, stable. No wall thickening. No adjacent inflammation. No bile duct dilation. Pancreas: No pancreatic mass or inflammation.  No duct dilation. Spleen: Normal in size without focal abnormality. Adrenals/Urinary Tract: Normal adrenal glands. Small nonobstructing stone in the midpole the left kidney. Tiny nonobstructing stone in the anterior midpole the right kidney. Posterior exophytic low-attenuation right renal mass, 1.4 cm, consistent  with a cyst, stable. No other masses. No hydronephrosis. Normal ureters. Normal bladder. Stomach/Bowel: Stomach is within normal limits. Appendix appears normal. No evidence of bowel wall thickening, distention, or inflammatory  changes. Lymphatic: No enlarged lymph nodes. Reproductive: Left adnexal cystic mass, 4.8 x 4.3 cm, decreased from 5.4 x 4.4 cm on the prior CT. Normal uterus. No right adnexal mass. Other: No abdominal wall hernia or abnormality. No abdominopelvic ascites. Musculoskeletal: No fracture or acute finding.  No bone lesion. Review of the MIP images confirms the above findings. IMPRESSION: CTA 1. No aortic aneurysm or dissection. Minor atherosclerosis. No findings to suggest acute aortic syndrome. 2. Thoracoabdominal aorta branch vessels are all widely patent. NON CTA 1. No acute findings within the chest, abdomen or pelvis. 2. Gallbladder wall calcification, but no evidence of acute cholecystitis and no change from the prior CT. 3. Left adnexal cyst, decreased in size compared to the prior CT, previously assessed with ultrasound on 07/03/2021. Given the interval decrease in size, this can be considered a benign cyst with no additional follow-up recommended. Electronically Signed   By: Amie Portland M.D.   On: 02/20/2022 13:40   DG Chest 2 View  Result Date: 02/20/2022 CLINICAL DATA:  Shortness of breath. EXAM: CHEST - 2 VIEW COMPARISON:  06/30/2021 FINDINGS: Heart size and mediastinal contours are unremarkable. There is no pleural effusion or edema. No airspace opacities identified. Visualized osseous structures notable for thoracic spondylosis. IMPRESSION: No active cardiopulmonary abnormalities. Electronically Signed   By: Signa Kell M.D.   On: 02/20/2022 09:18   DG Abdomen 1 View  Result Date: 02/09/2022 CLINICAL DATA:  Constipation EXAM: ABDOMEN - 1 VIEW COMPARISON:  06/21/2021 FINDINGS: Normal abdominal gas pattern. No free intraperitoneal gas. Moderate stool burden within the  transverse, descending and rectosigmoid colon. 2.3 cm rounded calcification within the right upper quadrant in keeping with a probable gallstone. Possible 4 mm left renal calculus. Multiple phleboliths noted within the pelvis. No organomegaly. Osseous structures are age-appropriate. IMPRESSION: 1. Moderate stool burden. 2. Cholelithiasis. 3. Possible 4 mm left renal calculus.  Un Electronically Signed   By: Helyn Numbers M.D.   On: 02/09/2022 02:11      Labs: BNP (last 3 results) No results for input(s): "BNP" in the last 8760 hours. Basic Metabolic Panel: No results for input(s): "NA", "K", "CL", "CO2", "GLUCOSE", "BUN", "CREATININE", "CALCIUM", "MG", "PHOS" in the last 168 hours. Liver Function Tests: No results for input(s): "AST", "ALT", "ALKPHOS", "BILITOT", "PROT", "ALBUMIN" in the last 168 hours. No results for input(s): "LIPASE", "AMYLASE" in the last 168 hours. No results for input(s): "AMMONIA" in the last 168 hours. CBC: No results for input(s): "WBC", "NEUTROABS", "HGB", "HCT", "MCV", "PLT" in the last 168 hours. Cardiac Enzymes: No results for input(s): "CKTOTAL", "CKMB", "CKMBINDEX", "TROPONINI" in the last 168 hours. BNP: Invalid input(s): "POCBNP" CBG: No results for input(s): "GLUCAP" in the last 168 hours. D-Dimer No results for input(s): "DDIMER" in the last 72 hours. Hgb A1c No results for input(s): "HGBA1C" in the last 72 hours. Lipid Profile No results for input(s): "CHOL", "HDL", "LDLCALC", "TRIG", "CHOLHDL", "LDLDIRECT" in the last 72 hours. Thyroid function studies No results for input(s): "TSH", "T4TOTAL", "T3FREE", "THYROIDAB" in the last 72 hours.  Invalid input(s): "FREET3" Anemia work up No results for input(s): "VITAMINB12", "FOLATE", "FERRITIN", "TIBC", "IRON", "RETICCTPCT" in the last 72 hours. Urinalysis    Component Value Date/Time   COLORURINE YELLOW (A) 02/23/2022 0701   APPEARANCEUR HAZY (A) 02/23/2022 0701   APPEARANCEUR Cloudy 11/19/2014  2137   LABSPEC 1.038 (H) 02/23/2022 0701   LABSPEC 1.026 11/19/2014 2137   PHURINE 5.0 02/23/2022 0701   GLUCOSEU >=500 (A) 02/23/2022 0701  GLUCOSEU Negative 11/19/2014 2137   HGBUR NEGATIVE 02/23/2022 0701   BILIRUBINUR NEGATIVE 02/23/2022 0701   BILIRUBINUR Negative 11/19/2014 2137   KETONESUR 20 (A) 02/23/2022 0701   PROTEINUR 100 (A) 02/23/2022 0701   NITRITE NEGATIVE 02/23/2022 0701   LEUKOCYTESUR NEGATIVE 02/23/2022 0701   LEUKOCYTESUR 3+ 11/19/2014 2137   Sepsis Labs No results for input(s): "WBC" in the last 168 hours.  Invalid input(s): "PROCALCITONIN", "LACTICIDVEN" Microbiology Recent Results (from the past 240 hour(s))  SARS Coronavirus 2 by RT PCR (hospital order, performed in [facility] hospital lab) *cepheid single result test* Anterior Nasal Swab     Status: None   Collection Time: 02/27/22  8:49 AM   Specimen: Anterior Nasal Swab  Result Value Ref Range Status   SARS Coronavirus 2 by RT PCR NEGATIVE NEGATIVE Final    Comment: (NOTE) SARS-CoV-2 target nucleic acids are NOT DETECTED.  The SARS-CoV-2 RNA is generally detectable in upper and lower respiratory specimens during the acute phase of infection. The lowest concentration of SARS-CoV-2 viral copies this assay can detect is 250 copies / mL. A negative result does not preclude SARS-CoV-2 infection and should not be used as the sole basis for treatment or other patient management decisions.  A negative result may occur with improper specimen collection / handling, submission of specimen other than nasopharyngeal swab, presence of viral mutation(s) within the areas targeted by this assay, and inadequate number of viral copies (<250 copies / mL). A negative result must be combined with clinical observations, patient history, and epidemiological information.  Fact Sheet for Patients:   RoadLapTop.co.za  Fact Sheet for Healthcare  Providers: http://kim-miller.com/  This test is not yet approved or  cleared by the [place] FDA and has been authorized for detection and/or diagnosis of SARS-CoV-2 by FDA under an Emergency Use Authorization (EUA).  This EUA will remain in effect (meaning this test can be used) for the duration of the COVID-19 declaration under Section 564(b)(1) of the Act, 21 U.S.C. section 360bbb-3(b)(1), unless the authorization is terminated or revoked sooner.  Performed at [facility], [address], [place], [place] [address]     No charge note.   Darlin Priestly, MD  [facility] 03/08/2022, 1:39 PM

## 2022-04-27 IMAGING — US US EXTREM LOW VENOUS*R*
1 series · 13 of 24 positions shown · non-contrast
Comparison: Prior left lower extremity venous duplex ultrasound on
05/25/2018

CLINICAL DATA: Right lower extremity pain and edema. History of
prior DVT.



[Series 1: us venous img lower uni right (dvt) · portal-venous · 13 of 36 slices shown]
[im 1/36]
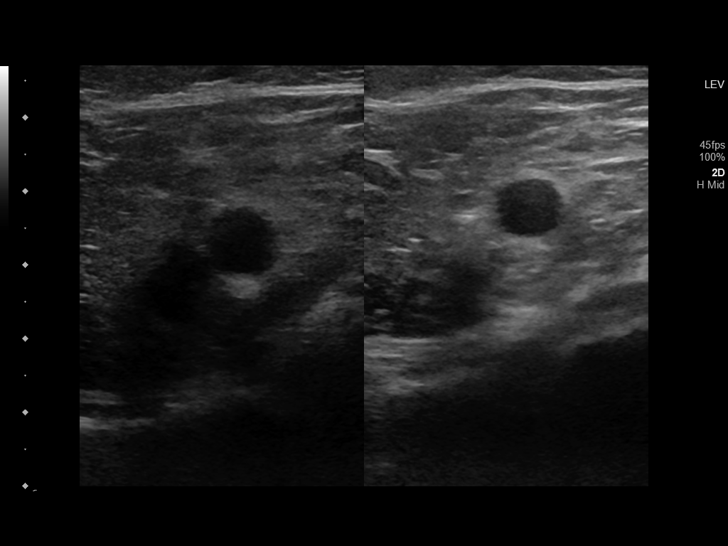
[im 4/36]
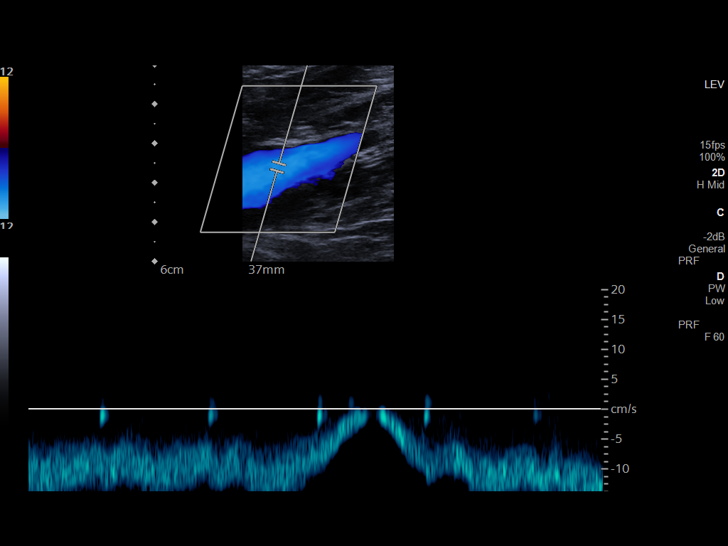
[im 7/36]
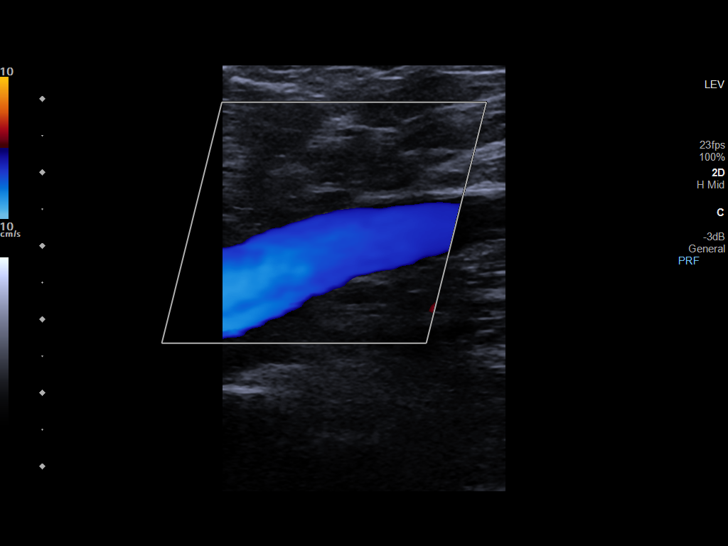
[im 10/36]
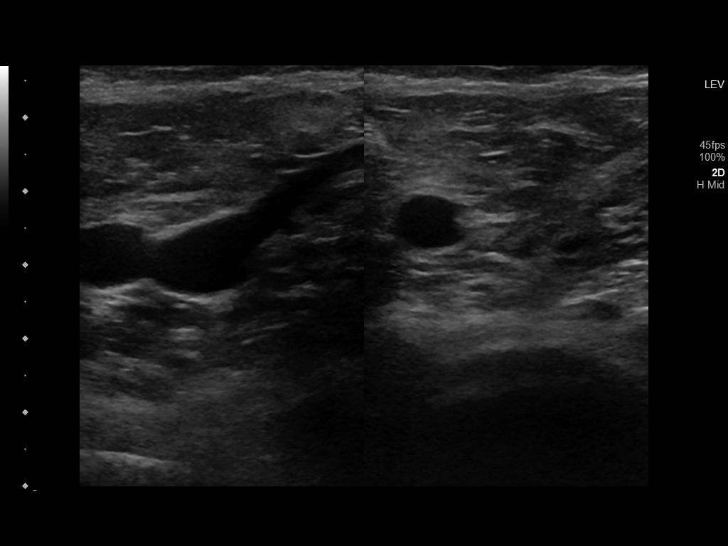
[im 13/36]
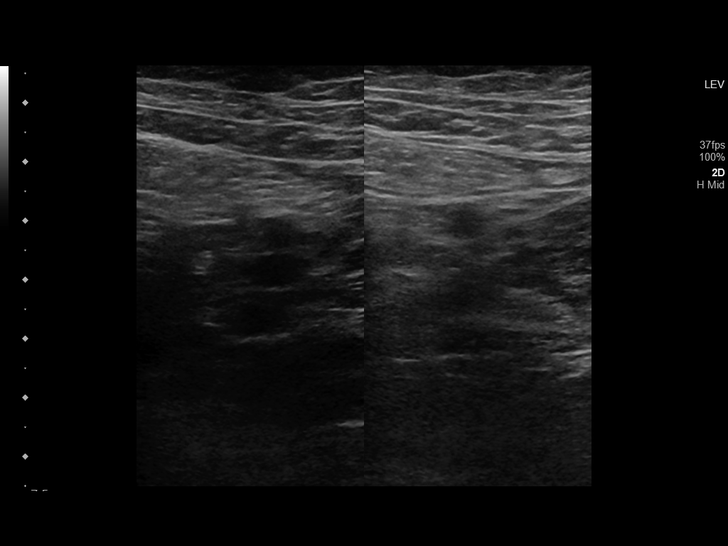
[im 16/36]
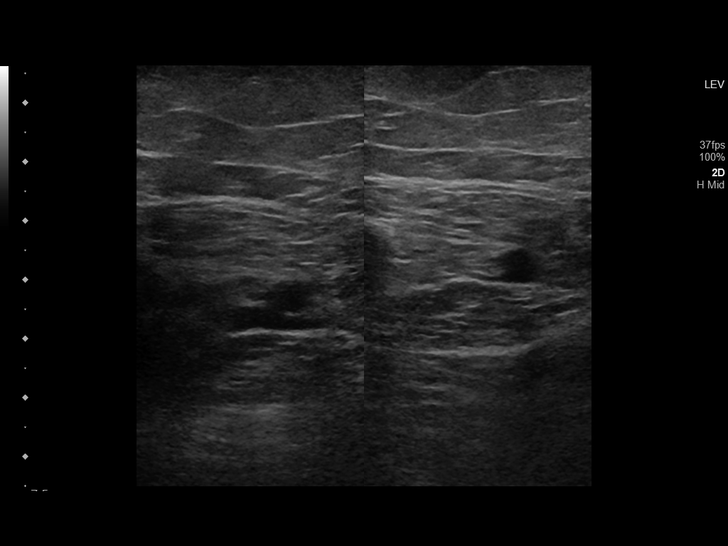
[im 19/36]
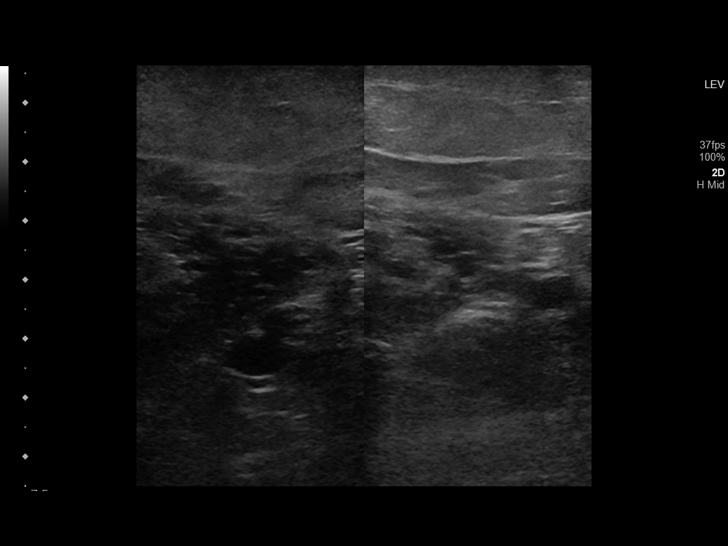
[im 20/36]
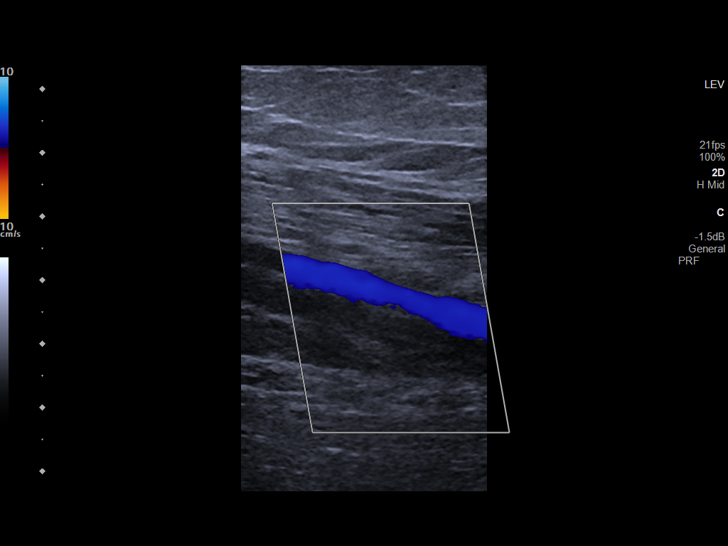
[im 23/36]
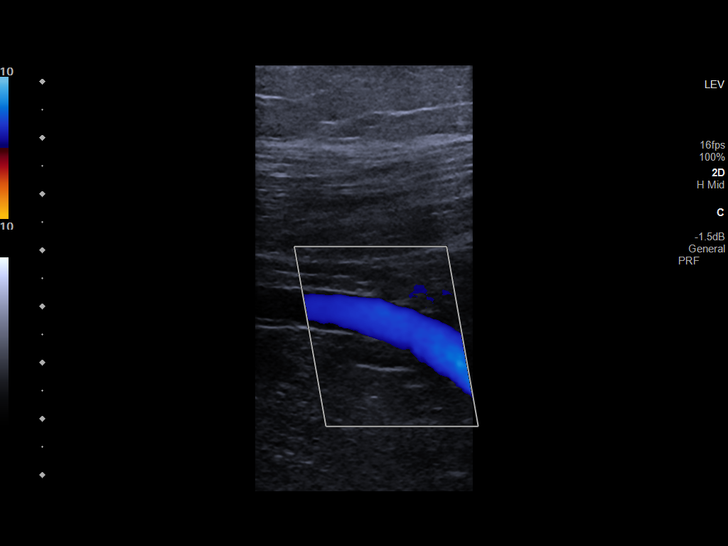
[im 26/36]
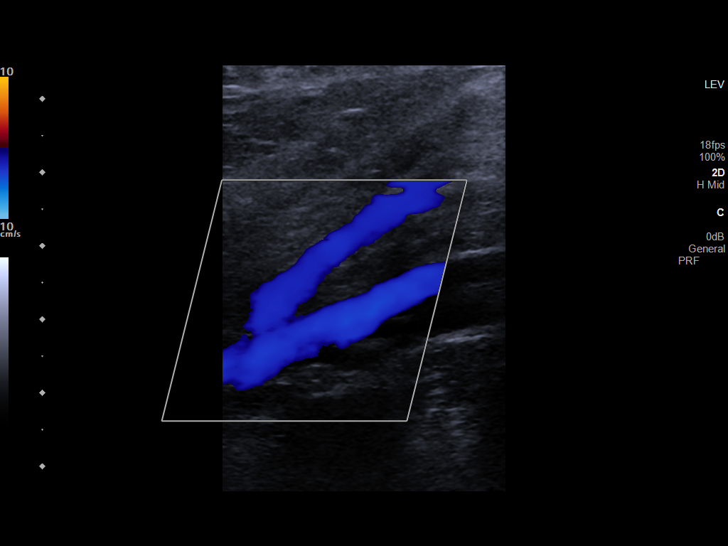
[im 29/36]
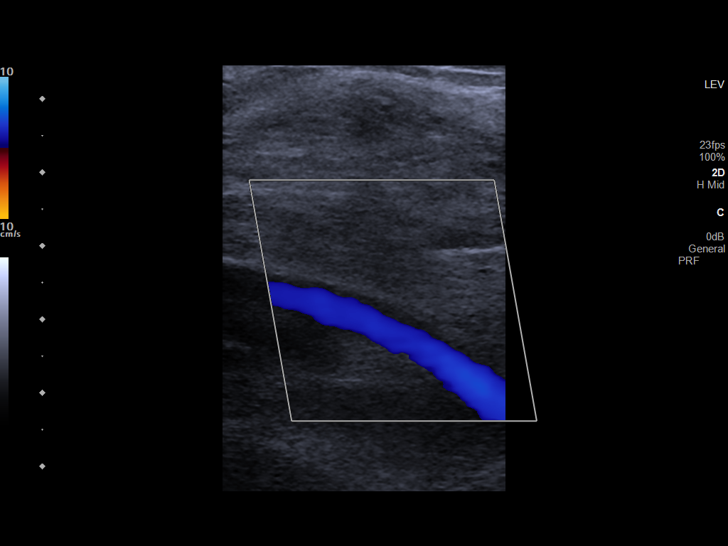
[im 32/36]
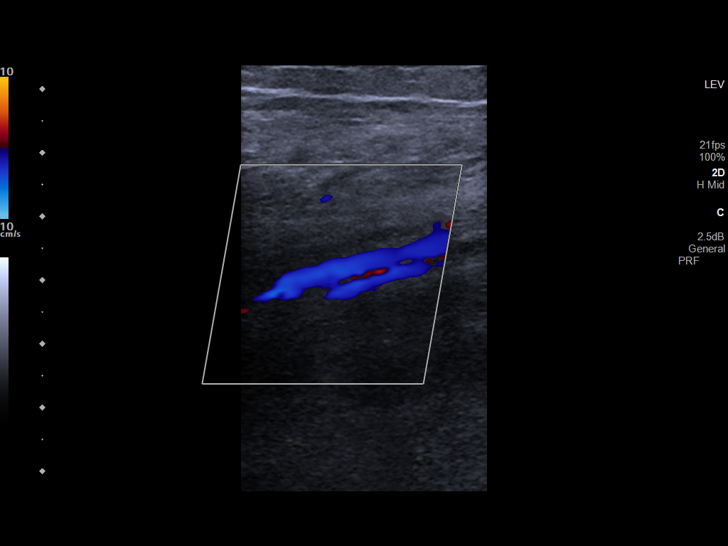
[im 36/36]
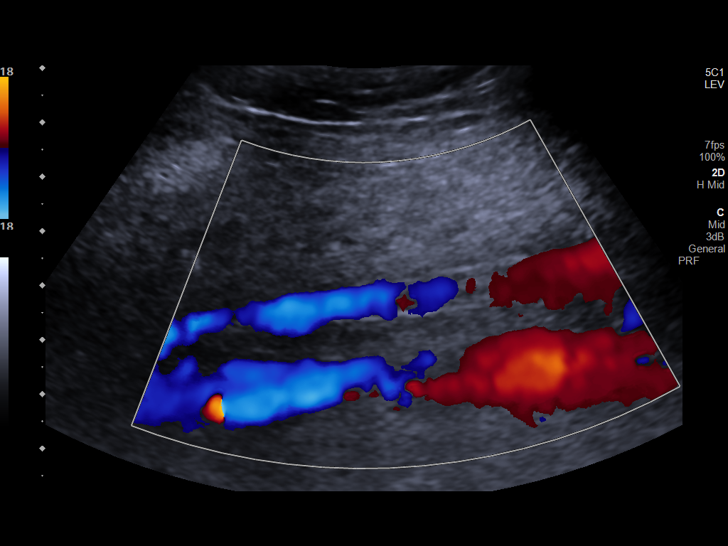

[13 of 24 positions shown; findings below may reference images not displayed]

FINDINGS: Contralateral Common Femoral Vein: Respiratory phasicity is normal
and symmetric with the symptomatic side. No evidence of thrombus.
Normal compressibility.

Common Femoral Vein: No evidence of thrombus. Normal
compressibility, respiratory phasicity and response to augmentation.

Saphenofemoral Junction: No evidence of thrombus. Normal
compressibility and flow on color Doppler imaging.

Profunda Femoral Vein: No evidence of thrombus. Normal
compressibility and flow on color Doppler imaging.

Femoral Vein: No evidence of thrombus. Normal compressibility,
respiratory phasicity and response to augmentation.

Popliteal Vein: No evidence of thrombus. Normal compressibility,
respiratory phasicity and response to augmentation.

Calf Veins: No evidence of thrombus. Normal compressibility and flow
on color Doppler imaging.

Superficial Great Saphenous Vein: No evidence of thrombus. Normal
compressibility.

Venous Reflux:  None.

Other Findings: No evidence of superficial thrombophlebitis or
abnormal fluid collection.
IMPRESSION: No evidence of right lower extremity deep venous thrombosis.

## 2022-06-14 DIAGNOSIS — F418 Other specified anxiety disorders: Secondary | ICD-10-CM | POA: Diagnosis present

## 2022-06-30 ENCOUNTER — Other Ambulatory Visit: Payer: Self-pay

## 2022-06-30 ENCOUNTER — Encounter: Payer: Self-pay | Admitting: Emergency Medicine

## 2022-06-30 ENCOUNTER — Emergency Department
Admission: EM | Admit: 2022-06-30 | Discharge: 2022-06-30 | Disposition: A | Discharge status: Home / Self Care | Payer: 59 | Attending: Emergency Medicine | Admitting: Emergency Medicine

## 2022-06-30 ENCOUNTER — Emergency Department: Payer: 59

## 2022-06-30 DIAGNOSIS — J45909 Unspecified asthma, uncomplicated: Secondary | ICD-10-CM | POA: Diagnosis not present

## 2022-06-30 DIAGNOSIS — R1013 Epigastric pain: Secondary | ICD-10-CM

## 2022-06-30 DIAGNOSIS — Z79899 Other long term (current) drug therapy: Secondary | ICD-10-CM | POA: Insufficient documentation

## 2022-06-30 DIAGNOSIS — Z794 Long term (current) use of insulin: Secondary | ICD-10-CM | POA: Insufficient documentation

## 2022-06-30 DIAGNOSIS — K8050 Calculus of bile duct without cholangitis or cholecystitis without obstruction: Secondary | ICD-10-CM

## 2022-06-30 DIAGNOSIS — Z7982 Long term (current) use of aspirin: Secondary | ICD-10-CM | POA: Diagnosis not present

## 2022-06-30 DIAGNOSIS — I10 Essential (primary) hypertension: Secondary | ICD-10-CM | POA: Insufficient documentation

## 2022-06-30 DIAGNOSIS — K8020 Calculus of gallbladder without cholecystitis without obstruction: Secondary | ICD-10-CM | POA: Insufficient documentation

## 2022-06-30 DIAGNOSIS — E119 Type 2 diabetes mellitus without complications: Secondary | ICD-10-CM | POA: Diagnosis not present

## 2022-06-30 LAB — CBC WITH DIFFERENTIAL/PLATELET
Abs Immature Granulocytes: 0.03 10*3/uL (ref 0.00–0.07)
Basophils Absolute: 0.1 10*3/uL (ref 0.0–0.1)
Basophils Relative: 1 %
Eosinophils Absolute: 0.1 10*3/uL (ref 0.0–0.5)
Eosinophils Relative: 1 %
HCT: 35.5 % — ABNORMAL LOW (ref 36.0–46.0)
Hemoglobin: 11 g/dL — ABNORMAL LOW (ref 12.0–15.0)
Immature Granulocytes: 0 %
Lymphocytes Relative: 14 %
Lymphs Abs: 1.5 10*3/uL (ref 0.7–4.0)
MCH: 24.8 pg — ABNORMAL LOW (ref 26.0–34.0)
MCHC: 31 g/dL (ref 30.0–36.0)
MCV: 80 fL (ref 80.0–100.0)
Monocytes Absolute: 0.7 10*3/uL (ref 0.1–1.0)
Monocytes Relative: 6 %
Neutro Abs: 8 10*3/uL — ABNORMAL HIGH (ref 1.7–7.7)
Neutrophils Relative %: 78 %
Platelets: 319 10*3/uL (ref 150–400)
RBC: 4.44 MIL/uL (ref 3.87–5.11)
RDW: 16.5 % — ABNORMAL HIGH (ref 11.5–15.5)
WBC: 10.4 10*3/uL (ref 4.0–10.5)
nRBC: 0 % (ref 0.0–0.2)

## 2022-06-30 LAB — ACETAMINOPHEN LEVEL: Acetaminophen (Tylenol), Serum: <10 ug/mL — ABNORMAL LOW (ref 10–30)

## 2022-06-30 LAB — COMPREHENSIVE METABOLIC PANEL
ALT: 13 U/L (ref 0–44)
AST: 16 U/L (ref 15–41)
Albumin: 4 g/dL (ref 3.5–5.0)
Alkaline Phosphatase: 100 U/L (ref 38–126)
Anion gap: 10 (ref 5–15)
BUN: 11 mg/dL (ref 6–20)
CO2: 25 mmol/L (ref 22–32)
Calcium: 9.4 mg/dL (ref 8.9–10.3)
Chloride: 105 mmol/L (ref 98–111)
Creatinine, Ser: 0.51 mg/dL (ref 0.44–1.00)
GFR, Estimated: >60 mL/min (ref >60)
Glucose, Bld: 187 mg/dL — ABNORMAL HIGH (ref 70–99)
Potassium: 3.6 mmol/L (ref 3.5–5.1)
Sodium: 140 mmol/L (ref 135–145)
Total Bilirubin: 0.5 mg/dL (ref 0.3–1.2)
Total Protein: 8 g/dL (ref 6.5–8.1)

## 2022-06-30 LAB — LIPASE, BLOOD: Lipase: 29 U/L (ref 11–51)

## 2022-06-30 LAB — SALICYLATE LEVEL: Salicylate Lvl: <7 mg/dL — ABNORMAL LOW (ref 7.0–30.0)

## 2022-06-30 LAB — TROPONIN I (HIGH SENSITIVITY): Troponin I (High Sensitivity): 7 ng/L (ref <18)

## 2022-06-30 MED ORDER — ONDANSETRON 4 MG PO TBDP: 4.0000 mg | ORAL_TABLET | Freq: Three times a day (TID) | ORAL | 0 refills | Status: DC | PRN

## 2022-06-30 MED ORDER — HYDROMORPHONE HCL 1 MG/ML IJ SOLN
0.5000 mg | Freq: Once | INTRAMUSCULAR | Status: AC
  Administered 2022-06-30: 0.5 mg via INTRAVENOUS
  Filled 2022-06-30: qty 0.5

## 2022-06-30 MED ORDER — OXYCODONE-ACETAMINOPHEN 5-325 MG PO TABS
1.0000 | ORAL_TABLET | Freq: Once | ORAL | Status: AC
  Administered 2022-06-30: 1 via ORAL
  Filled 2022-06-30: qty 1

## 2022-06-30 MED ORDER — ONDANSETRON HCL 4 MG/2ML IJ SOLN
4.0000 mg | Freq: Once | INTRAMUSCULAR | Status: AC
  Administered 2022-06-30: 4 mg via INTRAVENOUS
  Filled 2022-06-30: qty 2

## 2022-06-30 MED ORDER — OXYCODONE-ACETAMINOPHEN 5-325 MG PO TABS: 1.0000 | ORAL_TABLET | ORAL | 0 refills | Status: DC | PRN

## 2022-06-30 MED ORDER — SODIUM CHLORIDE 0.9 % IV BOLUS
1000.0000 mL | Freq: Once | INTRAVENOUS | Status: AC
  Administered 2022-06-30: 1000 mL via INTRAVENOUS

## 2022-06-30 NOTE — ED Provider Notes (Signed)
[facility] Provider Note    Event Date/Time   First MD Initiated Contact with Patient 06/30/22 [phone]     (approximate)   History   Abdominal Pain   HPI  Lauren Velazquez is a 61 y.o. female who presents to the ED from home with a chief complaint of abdominal pain.  Patient reports a 2-day history of upper abdominal pain radiating to her right chest associated with bloating and nausea.  History of pancreatitis.  Denies EtOH.  States she has been taking Tylenol and ibuprofen "like crazy".  Denies associated fever, cough, chest pain, shortness of breath, vomiting or diarrhea.     Past Medical History   Past Medical History:  Diagnosis Date   Anemia    not currently under treatment   Anxiety    Asthma    during allergy season   Diabetes mellitus without complication (HCC)    Hypertension    Vertigo      Active Problem List   Patient Active Problem List   Diagnosis Date Noted   Major depressive disorder, recurrent severe without psychotic features (HCC) 02/27/2022   Hypophosphatemia 02/25/2022   Hypokalemia 02/25/2022   Overdose 02/24/2022   Acetaminophen overdose, intentional self-harm, initial encounter (HCC) 02/23/2022   Suicidal behavior with attempted self-injury (HCC) 02/23/2022   Nausea vomiting and diarrhea 02/23/2022   Abdominal pain 02/23/2022   Chest pain 02/23/2022   Depression with anxiety    Leukocytosis    Normocytic anemia    Obesity with body mass index of 30.0-39.9    Refractory nausea and vomiting 02/20/2022   Acute metabolic encephalopathy 07/14/2021   DVT (deep venous thrombosis) (HCC) 07/14/2021   Vaginal bleeding, abnormal 07/14/2021   Restrictive lung disease 07/14/2021   OSA (obstructive sleep apnea) 07/14/2021   Cerebrovascular accident (CVA) (HCC)    Seizure (HCC) 06/30/2021   Respiratory insufficiency    Encephalopathy acute    Acute delirium 06/21/2021   Altered mental status 06/20/2021   Diabetes mellitus  without complication (HCC) 06/20/2021   Acute sepsis (HCC)    Pneumonia due to COVID-19 virus 07/15/2020   Acute respiratory failure due to COVID-19 (HCC) 07/15/2020   Obesity, Class III, BMI 40-49.9 (morbid obesity) (HCC) 07/15/2020   Chronic prescription opiate use 07/15/2020   Bacterial pneumonia 07/15/2020   Acute hypoxemic respiratory failure due to COVID-19 (HCC) 07/15/2020   Suspected COVID-19 virus infection 03/06/2020   Elevated troponin 03/06/2020   Acute hypoxemic respiratory failure (HCC) 03/05/2020   Diabetes mellitus type 2, uncomplicated (HCC) 05/30/2014   Hyperlipidemia 05/30/2014   Hypertension 05/30/2014   Obesity 05/30/2014   Vertigo 05/30/2014   Carpal tunnel syndrome on both sides 12/27/2013     Past Surgical History   Past Surgical History:  Procedure Laterality Date   CARPAL TUNNEL RELEASE Right 2015   KNEE ARTHROSCOPY Left 05/17/2018   Procedure: ARTHROSCOPY KNEE WITH LATERAL RELEASE, PARTIAL SYNOVECTOMY;  Surgeon: Kennedy Bucker, MD;  Location: [facility] OR[facility];  Service: Orthopedics;  Laterality: Left;   KNEE ARTHROSCOPY WITH LATERAL RELEASE Right 12/28/2017   Procedure: KNEE ARTHROSCOPY WITH LATERAL RELEASE;  Surgeon: Kennedy Bucker, MD;  Location: [facility] OR[facility];  Service: Orthopedics;  Laterality: Right;   TIBIAL TUBERCLERPLASTY Bilateral 06/01/2017   Procedure: TIBIAL TUBERCLE SPUR EXCISION;  Surgeon: Kennedy Bucker, MD;  Location: [facility] OR[facility];  Service: Orthopedics;  Laterality: Bilateral;     Home Medications   Prior to Admission medications   Medication Sig Start Date End Date Taking? Authorizing Provider  albuterol (  VENTOLIN HFA) 108 (90 Base) MCG/ACT inhaler Inhale 2 puffs into the lungs every 6 (six) hours as needed. 05/08/19   [provider]  amLODipine (NORVASC) 5 MG tablet Take 1 tablet (5 mg total) by mouth daily. 07/20/21   Osvaldo Shipper, MD  aspirin 81 MG chewable tablet Chew 1 tablet (81 mg total) by mouth daily. 02/28/22   Marrion Coy, MD   ezetimibe (ZETIA) 10 MG tablet Take 1 tablet (10 mg total) by mouth daily. 07/20/21   Osvaldo Shipper, MD  ferrous sulfate 325 (65 FE) MG tablet Take 1 tablet (325 mg total) by mouth 2 (two) times daily with a meal. 07/20/21   Osvaldo Shipper, MD  insulin aspart (NOVOLOG) 100 UNIT/ML injection Inject 12 Units into the skin 3 (three) times daily with meals. Patient taking differently: Inject 20 Units into the skin 3 (three) times daily with meals. 07/20/21   Osvaldo Shipper, MD  insulin glargine-yfgn (SEMGLEE) 100 UNIT/ML injection Inject 1.2 mLs (120 Units total) into the skin at bedtime. Patient taking differently: Inject 140 Units into the skin at bedtime. 02/27/22   Marrion Coy, MD  lacosamide (VIMPAT) 50 MG TABS tablet Take 1 tablet (50 mg total) by mouth 2 (two) times daily. 03/01/22   Clapacs, Jackquline Denmark, MD  levETIRAcetam (KEPPRA) 500 MG tablet Take 1 tablet (500 mg total) by mouth 2 (two) times daily. 03/01/22   Clapacs, Jackquline Denmark, MD  mirtazapine (REMERON) 30 MG tablet Take 1 tablet (30 mg total) by mouth at bedtime. 03/01/22   Clapacs, Jackquline Denmark, MD  oxyCODONE-acetaminophen (PERCOCET) 10-325 MG tablet Take 1 tablet by mouth every 5 (five) hours as needed for pain. 07/20/21   Osvaldo Shipper, MD  pantoprazole (PROTONIX) 40 MG tablet Take 1 tablet (40 mg total) by mouth daily at 12 noon. 03/01/22   Clapacs, Jackquline Denmark, MD  prazosin (MINIPRESS) 1 MG capsule Take 1 capsule (1 mg total) by mouth at bedtime. 03/01/22   Clapacs, Jackquline Denmark, MD  pregabalin (LYRICA) 75 MG capsule Take 75 mg by mouth 3 (three) times daily. 02/03/22   [provider]  progesterone (PROMETRIUM) 200 MG capsule Take 200 mg by mouth at bedtime. 01/21/22   [provider]  vitamin B-12 (CYANOCOBALAMIN) 500 MCG tablet Take 1 tablet (500 mcg total) by mouth daily. 07/20/21   Osvaldo Shipper, MD     Allergies  Atorvastatin   Family History   Family History  Problem Relation Age of Onset   Seizures Sister    Seizures Brother       Physical Exam  Triage Vital Signs: ED Triage Vitals  Enc Vitals Group     BP 06/30/22 0327 (!) 177/101     Pulse Rate 06/30/22 0327 (!) 103     Resp 06/30/22 0327 20     Temp 06/30/22 0327 97.8 F (36.6 C)     Temp Source 06/30/22 0327 Oral     SpO2 06/30/22 0327 96 %     Weight 06/30/22 0331 220 lb (99.8 kg)     Height 06/30/22 0331 5\' 2"  (1.575 m)     Head Circumference --      Peak Flow --      Pain Score 06/30/22 0331 8     Pain Loc --      Pain Edu? --      Excl. in GC? --     Updated Vital Signs: BP (!) 164/88   Pulse 87   Temp 97.8 F (36.6 C) (Oral)  Resp 17   Ht 5\' 2"  (1.575 m)   Wt 99.8 kg   LMP 08/15/2008   SpO2 93%   BMI 40.24 kg/m    General: Awake, mild distress.  CV:  Tachycardic.  Good peripheral perfusion.  Resp:  Normal effort.  TAB. Abd:  Tender to palpation upper abdomen without rebound or guarding.  Mild distention.  Other:  No CVAT.  No truncal vesicles.   ED Results / Procedures / Treatments  Labs (all labs ordered are listed, but only abnormal results are displayed) Labs Reviewed  CBC WITH DIFFERENTIAL/PLATELET - Abnormal; Notable for the following components:      Result Value   Hemoglobin 11.0 (*)    HCT 35.5 (*)    MCH 24.8 (*)    RDW 16.5 (*)    Neutro Abs 8.0 (*)    All other components within normal limits  COMPREHENSIVE METABOLIC PANEL - Abnormal; Notable for the following components:   Glucose, Bld 187 (*)    All other components within normal limits  ACETAMINOPHEN LEVEL - Abnormal; Notable for the following components:   Acetaminophen (Tylenol), Serum <10 (*)    All other components within normal limits  SALICYLATE LEVEL - Abnormal; Notable for the following components:   Salicylate Lvl <7.0 (*)    All other components within normal limits  LIPASE, BLOOD  URINALYSIS, ROUTINE W REFLEX MICROSCOPIC  TROPONIN I (HIGH SENSITIVITY)  TROPONIN I (HIGH SENSITIVITY)     EKG  ED ECG REPORT I, Kaiana Marion J, the  attending physician, personally viewed and interpreted this ECG.   Date: 06/30/2022  EKG Time: 0357  Rate: 119  Rhythm: sinus tachycardia  Axis: LAD  Intervals:none  ST&T Change: Nonspecific    RADIOLOGY I have independently visualized and interpreted patient's ultrasound as well as noted the radiology interpretation:  [employer]:  Official radiology report(s): 07/02/2022 ABDOMEN LIMITED RUQ (LIVER/GB)  Result Date: 06/30/2022 CLINICAL DATA:  61 year old female with history of epigastric pain. EXAM: ULTRASOUND ABDOMEN LIMITED RIGHT UPPER QUADRANT COMPARISON:  Abdominal ultrasound 02/20/2022. FINDINGS: Gallbladder: Gallbladder appears nearly completely contracted around a large echogenic focus with posterior acoustic shadowing, compatible with a large gallstone, estimated to measure 1.4 cm in diameter. Gallbladder wall thickness is minimally increased at 4 mm (likely accentuated by underdistention of the gallbladder). No pericholecystic fluid. Per report from the sonographer, there was no sonographic Murphy's sign on examination. Common bile duct: Diameter: 8 mm Liver: No focal lesion identified. Mild diffusely increased hepatic parenchymal echogenicity. Portal vein is patent on color Doppler imaging with normal direction of blood flow towards the liver. Other: None. IMPRESSION: 1. Study is positive for cholelithiasis, without evidence of acute cholecystitis at this time. 2. Mild dilatation of the common bile duct (8 mm). This is abnormal for the patient's age, but not currently associated with any intrahepatic biliary ductal dilatation. If there is clinical concern for choledocholithiasis or other cause of mild biliary tract obstruction, further evaluation with abdominal MRI with and without IV gadolinium with MRCP should be considered. 3. Mild hepatic steatosis. Electronically Signed   By: 04/23/2022 M.D.   On: 06/30/2022 05:18     PROCEDURES:  Critical Care performed: No  .1-3 Lead EKG  Interpretation  Performed by: 07/02/2022, MD Authorized by: Irean Hong, MD     Interpretation: abnormal     ECG rate:  120   ECG rate assessment: tachycardic     Rhythm: sinus tachycardia     Ectopy: none  Conduction: normal   Comments:     Patient placed on cardiac monitor to evaluate for arrhythmias    MEDICATIONS ORDERED IN ED: Medications  oxyCODONE-acetaminophen (PERCOCET/ROXICET) 5-325 MG per tablet 1 tablet (has no administration in time range)  sodium chloride 0.9 % bolus 1,000 mL (1,000 mLs Intravenous New Bag/Given 06/30/22 0412)  ondansetron (ZOFRAN) injection 4 mg (4 mg Intravenous Given 06/30/22 0408)  HYDROmorphone (DILAUDID) injection 0.5 mg (0.5 mg Intravenous Given 06/30/22 0409)     IMPRESSION / MDM / ASSESSMENT AND PLAN / ED COURSE  I reviewed the triage vital signs and the nursing notes.                             61 year old female presenting with upper abdominal pain, history of pancreatitis. Differential diagnosis includes, but is not limited to, biliary disease (biliary colic, acute cholecystitis, cholangitis, choledocholithiasis, etc), intrathoracic causes for epigastric abdominal pain including ACS, gastritis, duodenitis, pancreatitis, small bowel or large bowel obstruction, abdominal aortic aneurysm, hernia, and ulcer(s).  I have personally reviewed patient's records and note an orthopedics office visit on 06/16/2022 for right shoulder and hip pain.  Patient's presentation is most consistent with acute presentation with potential threat to life or bodily function.  The patient is on the cardiac monitor to evaluate for evidence of arrhythmia and/or significant heart rate changes.  We will obtain lab work including LFTs/lipase, acetaminophen and salicylate levels.  Obtain right upper quadrant abdominal ultrasound to evaluate for cholecystitis.  Initiate IV fluid resuscitation, IV Dilaudid for pain paired with IV Zofran for nausea.  Will  reassess.  Clinical Course as of 06/30/22 0542  Thu Jun 30, 2022  0540 Pain improved after IV Dilaudid.  Laboratory results demonstrate normal WBC 10.4, normal electrolytes including LFTs/lipase, unremarkable acetaminophen and salicylate levels.  Ultrasound demonstrates cholelithiasis without cholecystitis.  No rebound or guarding on abdominal reexamination; clinically do not suspect choledocholithiasis.  Will administer oral Percocet now and discharged home with as needed Percocet and Zofran and patient will follow-up closely with general surgery.  Strict return precautions given.  Patient and spouse verbalized understanding agree with plan of care. [JS]    Clinical Course User Index [JS] Irean Hong, MD     FINAL CLINICAL IMPRESSION(S) / ED DIAGNOSES   Final diagnoses:  Epigastric pain  Biliary colic  Calculus of gallbladder without cholecystitis without obstruction     Rx / DC Orders   ED Discharge Orders     None        Note:  This document was prepared using Dragon voice recognition software and may include unintentional dictation errors.   Irean Hong, MD 06/30/22 [phone]

## 2022-06-30 NOTE — ED Triage Notes (Addendum)
Patient ambulatory to triage with steady gait; pt is restless and unable to sit for any length of time before jumping back up to stand; pt reports x 2 days having rt upper abd pain and lower abd pain and rt shoulder pain accomp by bloating; st hx pancreatitis

## 2022-06-30 NOTE — Discharge Instructions (Signed)
1. Take medicines as needed for pain & nausea (Percocet/Zofran #30). 2. Clear liquids x 12 hours, then bland diet x 1 week, then slowly advance diet as tolerated. Avoid fatty, greasy, spicy foods and drinks. 3. Return to the ER for worsening symptoms, persistent vomiting, fever, difficulty breathing or other concerns.  

## 2022-08-05 ENCOUNTER — Ambulatory Visit (HOSPITAL_BASED_OUTPATIENT_CLINIC_OR_DEPARTMENT_OTHER)
Admission: EM | Admit: 2022-08-05 | Discharge: 2022-08-05 | Disposition: A | Discharge status: Home / Self Care | Payer: 59 | Source: Home / Self Care | Attending: Emergency Medicine | Admitting: Emergency Medicine

## 2022-08-05 ENCOUNTER — Encounter
Admission: EM | Disposition: A | Discharge status: Home / Self Care | Payer: Self-pay | Source: Home / Self Care | Attending: Emergency Medicine

## 2022-08-05 ENCOUNTER — Emergency Department: Payer: 59 | Admitting: Anesthesiology

## 2022-08-05 ENCOUNTER — Other Ambulatory Visit: Payer: Self-pay

## 2022-08-05 ENCOUNTER — Encounter: Payer: Self-pay | Admitting: Emergency Medicine

## 2022-08-05 ENCOUNTER — Emergency Department: Payer: 59

## 2022-08-05 DIAGNOSIS — I6389 Other cerebral infarction: Secondary | ICD-10-CM | POA: Diagnosis not present

## 2022-08-05 DIAGNOSIS — D649 Anemia, unspecified: Secondary | ICD-10-CM | POA: Insufficient documentation

## 2022-08-05 DIAGNOSIS — I10 Essential (primary) hypertension: Secondary | ICD-10-CM | POA: Insufficient documentation

## 2022-08-05 DIAGNOSIS — Z1152 Encounter for screening for COVID-19: Secondary | ICD-10-CM | POA: Insufficient documentation

## 2022-08-05 DIAGNOSIS — E119 Type 2 diabetes mellitus without complications: Secondary | ICD-10-CM | POA: Insufficient documentation

## 2022-08-05 DIAGNOSIS — Z8673 Personal history of transient ischemic attack (TIA), and cerebral infarction without residual deficits: Secondary | ICD-10-CM | POA: Insufficient documentation

## 2022-08-05 DIAGNOSIS — Z6838 Body mass index (BMI) 38.0-38.9, adult: Secondary | ICD-10-CM | POA: Insufficient documentation

## 2022-08-05 DIAGNOSIS — G4733 Obstructive sleep apnea (adult) (pediatric): Secondary | ICD-10-CM | POA: Insufficient documentation

## 2022-08-05 DIAGNOSIS — K8010 Calculus of gallbladder with chronic cholecystitis without obstruction: Secondary | ICD-10-CM

## 2022-08-05 DIAGNOSIS — K8020 Calculus of gallbladder without cholecystitis without obstruction: Secondary | ICD-10-CM

## 2022-08-05 DIAGNOSIS — Z794 Long term (current) use of insulin: Secondary | ICD-10-CM | POA: Insufficient documentation

## 2022-08-05 LAB — CBC WITH DIFFERENTIAL/PLATELET
Abs Immature Granulocytes: 0.03 10*3/uL (ref 0.00–0.07)
Basophils Absolute: 0.1 10*3/uL (ref 0.0–0.1)
Basophils Relative: 1 %
Eosinophils Absolute: 0.1 10*3/uL (ref 0.0–0.5)
Eosinophils Relative: 1 %
HCT: 31.2 % — ABNORMAL LOW (ref 36.0–46.0)
Hemoglobin: 9.1 g/dL — ABNORMAL LOW (ref 12.0–15.0)
Immature Granulocytes: 0 %
Lymphocytes Relative: 18 %
Lymphs Abs: 1.5 10*3/uL (ref 0.7–4.0)
MCH: 23.6 pg — ABNORMAL LOW (ref 26.0–34.0)
MCHC: 29.2 g/dL — ABNORMAL LOW (ref 30.0–36.0)
MCV: 80.8 fL (ref 80.0–100.0)
Monocytes Absolute: 0.4 10*3/uL (ref 0.1–1.0)
Monocytes Relative: 5 %
Neutro Abs: 6.4 10*3/uL (ref 1.7–7.7)
Neutrophils Relative %: 75 %
Platelets: 259 10*3/uL (ref 150–400)
RBC: 3.86 MIL/uL — ABNORMAL LOW (ref 3.87–5.11)
RDW: 15.1 % (ref 11.5–15.5)
WBC: 8.4 10*3/uL (ref 4.0–10.5)
nRBC: 0 % (ref 0.0–0.2)

## 2022-08-05 LAB — RESP PANEL BY RT-PCR (RSV, FLU A&B, COVID)  RVPGX2
Influenza A by PCR: NEGATIVE
Influenza B by PCR: NEGATIVE
Resp Syncytial Virus by PCR: NEGATIVE
SARS Coronavirus 2 by RT PCR: NEGATIVE

## 2022-08-05 LAB — COMPREHENSIVE METABOLIC PANEL
ALT: 12 U/L (ref 0–44)
AST: 11 U/L — ABNORMAL LOW (ref 15–41)
Albumin: 3.4 g/dL — ABNORMAL LOW (ref 3.5–5.0)
Alkaline Phosphatase: 92 U/L (ref 38–126)
Anion gap: 5 (ref 5–15)
BUN: 13 mg/dL (ref 6–20)
CO2: 27 mmol/L (ref 22–32)
Calcium: 8.8 mg/dL — ABNORMAL LOW (ref 8.9–10.3)
Chloride: 107 mmol/L (ref 98–111)
Creatinine, Ser: 0.56 mg/dL (ref 0.44–1.00)
GFR, Estimated: >60 mL/min (ref >60)
Glucose, Bld: 208 mg/dL — ABNORMAL HIGH (ref 70–99)
Potassium: 3.9 mmol/L (ref 3.5–5.1)
Sodium: 139 mmol/L (ref 135–145)
Total Bilirubin: 0.5 mg/dL (ref 0.3–1.2)
Total Protein: 7.4 g/dL (ref 6.5–8.1)

## 2022-08-05 LAB — CBG MONITORING, ED
Glucose-Capillary: 120 mg/dL — ABNORMAL HIGH (ref 70–99)
Glucose-Capillary: 187 mg/dL — ABNORMAL HIGH (ref 70–99)

## 2022-08-05 LAB — TROPONIN I (HIGH SENSITIVITY): Troponin I (High Sensitivity): 3 ng/L (ref <18)

## 2022-08-05 LAB — URINALYSIS, ROUTINE W REFLEX MICROSCOPIC
Bilirubin Urine: NEGATIVE
Glucose, UA: NEGATIVE mg/dL
Hgb urine dipstick: NEGATIVE
Ketones, ur: NEGATIVE mg/dL
Leukocytes,Ua: NEGATIVE
Nitrite: NEGATIVE
Protein, ur: NEGATIVE mg/dL
Specific Gravity, Urine: 1.024 (ref 1.005–1.030)
pH: 5 (ref 5.0–8.0)

## 2022-08-05 LAB — LIPASE, BLOOD: Lipase: 24 U/L (ref 11–51)

## 2022-08-05 SURGERY — CHOLECYSTECTOMY, ROBOT-ASSISTED, LAPAROSCOPIC
Anesthesia: General

## 2022-08-05 MED ORDER — EPHEDRINE SULFATE (PRESSORS) 50 MG/ML IJ SOLN
INTRAMUSCULAR | Status: DC | PRN
  Administered 2022-08-05: 5 mg via INTRAVENOUS

## 2022-08-05 MED ORDER — FENTANYL CITRATE (PF) 100 MCG/2ML IJ SOLN
INTRAMUSCULAR | Status: DC | PRN
  Administered 2022-08-05 (×2): 50 ug via INTRAVENOUS

## 2022-08-05 MED ORDER — SODIUM CHLORIDE FLUSH 0.9 % IV SOLN
INTRAVENOUS | Status: AC
  Filled 2022-08-05: qty 10

## 2022-08-05 MED ORDER — SODIUM CHLORIDE 0.9 % IV SOLN
25.0000 mg | Freq: Four times a day (QID) | INTRAVENOUS | Status: DC | PRN
  Administered 2022-08-05: 25 mg via INTRAVENOUS
  Filled 2022-08-05: qty 1

## 2022-08-05 MED ORDER — PROPOFOL 10 MG/ML IV BOLUS
INTRAVENOUS | Status: DC | PRN
  Administered 2022-08-05: 200 mg via INTRAVENOUS

## 2022-08-05 MED ORDER — KETOROLAC TROMETHAMINE 30 MG/ML IJ SOLN
30.0000 mg | Freq: Once | INTRAMUSCULAR | Status: AC
  Administered 2022-08-05: 30 mg via INTRAVENOUS

## 2022-08-05 MED ORDER — FENTANYL CITRATE (PF) 100 MCG/2ML IJ SOLN
25.0000 ug | INTRAMUSCULAR | Status: DC | PRN
  Administered 2022-08-05 (×2): 25 ug via INTRAVENOUS
  Administered 2022-08-05: 50 ug via INTRAVENOUS

## 2022-08-05 MED ORDER — BUPIVACAINE-EPINEPHRINE (PF) 0.25% -1:200000 IJ SOLN
INTRAMUSCULAR | Status: DC | PRN
  Administered 2022-08-05: 30 mL

## 2022-08-05 MED ORDER — DEXAMETHASONE SODIUM PHOSPHATE 10 MG/ML IJ SOLN
INTRAMUSCULAR | Status: DC | PRN
  Administered 2022-08-05: 8 mg via INTRAVENOUS

## 2022-08-05 MED ORDER — FENTANYL CITRATE (PF) 100 MCG/2ML IJ SOLN
INTRAMUSCULAR | Status: AC
  Filled 2022-08-05: qty 2

## 2022-08-05 MED ORDER — OXYCODONE HCL 5 MG PO TABS
ORAL_TABLET | ORAL | Status: AC
  Filled 2022-08-05: qty 1

## 2022-08-05 MED ORDER — PROMETHAZINE HCL 25 MG/ML IJ SOLN
INTRAMUSCULAR | Status: AC
  Filled 2022-08-05: qty 1

## 2022-08-05 MED ORDER — HYDROMORPHONE HCL 1 MG/ML IJ SOLN
1.0000 mg | Freq: Once | INTRAMUSCULAR | Status: AC
  Administered 2022-08-05: 1 mg via INTRAVENOUS
  Filled 2022-08-05: qty 1

## 2022-08-05 MED ORDER — KETOROLAC TROMETHAMINE 30 MG/ML IJ SOLN
INTRAMUSCULAR | Status: AC
  Filled 2022-08-05: qty 1

## 2022-08-05 MED ORDER — INDOCYANINE GREEN 25 MG IV SOLR
1.2500 mg | Freq: Once | INTRAVENOUS | Status: AC
  Administered 2022-08-05: 1.25 mg via INTRAVENOUS
  Filled 2022-08-05: qty 0.5

## 2022-08-05 MED ORDER — PROPOFOL 10 MG/ML IV BOLUS
INTRAVENOUS | Status: AC
  Filled 2022-08-05: qty 20

## 2022-08-05 MED ORDER — SODIUM CHLORIDE 0.9 % IV SOLN: INTRAVENOUS | Status: DC | PRN

## 2022-08-05 MED ORDER — OXYCODONE HCL 5 MG PO TABS: 5.0000 mg | ORAL_TABLET | Freq: Four times a day (QID) | ORAL | 0 refills | Status: DC | PRN

## 2022-08-05 MED ORDER — ONDANSETRON HCL 4 MG/2ML IJ SOLN
INTRAMUSCULAR | Status: DC | PRN
  Administered 2022-08-05: 4 mg via INTRAVENOUS

## 2022-08-05 MED ORDER — ROCURONIUM BROMIDE 100 MG/10ML IV SOLN
INTRAVENOUS | Status: DC | PRN
  Administered 2022-08-05: 50 mg via INTRAVENOUS

## 2022-08-05 MED ORDER — PHENYLEPHRINE HCL (PRESSORS) 10 MG/ML IV SOLN
INTRAVENOUS | Status: DC | PRN
  Administered 2022-08-05: 180 ug via INTRAVENOUS
  Administered 2022-08-05: 80 ug via INTRAVENOUS
  Administered 2022-08-05: 180 ug via INTRAVENOUS
  Administered 2022-08-05: 80 ug via INTRAVENOUS

## 2022-08-05 MED ORDER — ACETAMINOPHEN 10 MG/ML IV SOLN
INTRAVENOUS | Status: AC
  Filled 2022-08-05: qty 100

## 2022-08-05 MED ORDER — ACETAMINOPHEN 10 MG/ML IV SOLN
INTRAVENOUS | Status: DC | PRN
  Administered 2022-08-05: 1000 mg via INTRAVENOUS

## 2022-08-05 MED ORDER — ONDANSETRON HCL 4 MG/2ML IJ SOLN
INTRAMUSCULAR | Status: AC
  Filled 2022-08-05: qty 2

## 2022-08-05 MED ORDER — VASOPRESSIN 20 UNIT/ML IV SOLN
INTRAVENOUS | Status: DC | PRN
  Administered 2022-08-05: 2 [IU] via INTRAVENOUS

## 2022-08-05 MED ORDER — LIDOCAINE HCL (PF) 2 % IJ SOLN
INTRAMUSCULAR | Status: AC
  Filled 2022-08-05: qty 5

## 2022-08-05 MED ORDER — LACTATED RINGERS IV SOLN: INTRAVENOUS | Status: DC

## 2022-08-05 MED ORDER — KETOROLAC TROMETHAMINE 15 MG/ML IJ SOLN
15.0000 mg | Freq: Once | INTRAMUSCULAR | Status: AC
  Administered 2022-08-05: 15 mg via INTRAVENOUS
  Filled 2022-08-05: qty 1

## 2022-08-05 MED ORDER — OXYCODONE HCL 5 MG PO TABS
5.0000 mg | ORAL_TABLET | Freq: Once | ORAL | Status: AC | PRN
  Administered 2022-08-05: 5 mg

## 2022-08-05 MED ORDER — SODIUM CHLORIDE 0.9 % IV BOLUS
1000.0000 mL | Freq: Once | INTRAVENOUS | Status: AC
  Administered 2022-08-05: 1000 mL via INTRAVENOUS

## 2022-08-05 MED ORDER — OXYCODONE HCL 5 MG/5ML PO SOLN: 5.0000 mg | Freq: Once | ORAL | Status: AC | PRN

## 2022-08-05 MED ORDER — CEFAZOLIN SODIUM-DEXTROSE 2-4 GM/100ML-% IV SOLN
2.0000 g | Freq: Once | INTRAVENOUS | Status: AC
  Administered 2022-08-05: 2 g via INTRAVENOUS
  Filled 2022-08-05: qty 100

## 2022-08-05 MED ORDER — LIDOCAINE HCL (CARDIAC) PF 100 MG/5ML IV SOSY
PREFILLED_SYRINGE | INTRAVENOUS | Status: DC | PRN
  Administered 2022-08-05: 100 mg via INTRAVENOUS

## 2022-08-05 MED ORDER — ONDANSETRON HCL 4 MG/2ML IJ SOLN
4.0000 mg | Freq: Once | INTRAMUSCULAR | Status: AC
  Administered 2022-08-05: 4 mg via INTRAVENOUS
  Filled 2022-08-05 (×2): qty 2

## 2022-08-05 MED ORDER — PROMETHAZINE HCL 25 MG/ML IJ SOLN
6.2500 mg | Freq: Once | INTRAMUSCULAR | Status: AC
  Administered 2022-08-05: 6.25 mg via INTRAVENOUS

## 2022-08-05 MED ORDER — SUCCINYLCHOLINE CHLORIDE 200 MG/10ML IV SOSY
PREFILLED_SYRINGE | INTRAVENOUS | Status: DC | PRN
  Administered 2022-08-05: 120 mg via INTRAVENOUS

## 2022-08-05 MED ORDER — SUGAMMADEX SODIUM 200 MG/2ML IV SOLN
INTRAVENOUS | Status: DC | PRN
  Administered 2022-08-05: 200 mg via INTRAVENOUS

## 2022-08-05 MED ORDER — IBUPROFEN 800 MG PO TABS: 800.0000 mg | ORAL_TABLET | Freq: Three times a day (TID) | ORAL | 0 refills | Status: AC | PRN

## 2022-08-05 SURGICAL SUPPLY — 46 items
ADH SKN CLS APL DERMABOND .7 (GAUZE/BANDAGES/DRESSINGS) ×1
BAG PRESSURE INF REUSE 3000 (BAG) IMPLANT
CLIP LIGATING HEM O LOK PURPLE (MISCELLANEOUS) ×1 IMPLANT
COVER TIP SHEARS 8 DVNC (MISCELLANEOUS) ×1 IMPLANT
COVER TIP SHEARS 8MM DA VINCI (MISCELLANEOUS) ×1
DERMABOND ADVANCED .7 DNX12 (GAUZE/BANDAGES/DRESSINGS) ×1 IMPLANT
DRAPE ARM DVNC X/XI (DISPOSABLE) ×4 IMPLANT
DRAPE COLUMN DVNC XI (DISPOSABLE) ×1 IMPLANT
DRAPE DA VINCI XI ARM (DISPOSABLE) ×4
DRAPE DA VINCI XI COLUMN (DISPOSABLE) ×1
ELECT CAUTERY BLADE 6.4 (BLADE) ×1 IMPLANT
GLOVE ORTHO TXT STRL SZ7.5 (GLOVE) ×2 IMPLANT
GOWN STRL REUS W/ TWL LRG LVL3 (GOWN DISPOSABLE) ×2 IMPLANT
GOWN STRL REUS W/ TWL XL LVL3 (GOWN DISPOSABLE) ×2 IMPLANT
GOWN STRL REUS W/TWL LRG LVL3 (GOWN DISPOSABLE) ×2
GOWN STRL REUS W/TWL XL LVL3 (GOWN DISPOSABLE) ×2
GRASPER SUT TROCAR 14GX15 (MISCELLANEOUS) IMPLANT
IRRIGATION STRYKERFLOW (MISCELLANEOUS) IMPLANT
IRRIGATOR STRYKERFLOW (MISCELLANEOUS)
IRRIGATOR SUCT 8 DISP DVNC XI (IRRIGATION / IRRIGATOR) IMPLANT
IRRIGATOR SUCTION 8MM XI DISP (IRRIGATION / IRRIGATOR)
IV NS IRRIG 3000ML ARTHROMATIC (IV SOLUTION) IMPLANT
KIT PINK PAD W/HEAD ARE REST (MISCELLANEOUS) ×1 IMPLANT
KIT PINK PAD W/HEAD ARM REST (MISCELLANEOUS) ×1 IMPLANT
KIT TURNOVER KIT A (KITS) ×1 IMPLANT
LABEL OR SOLS (LABEL) ×1 IMPLANT
MANIFOLD NEPTUNE II (INSTRUMENTS) ×1 IMPLANT
NDL INSUFFLATION 14GA 120MM (NEEDLE) IMPLANT
NEEDLE HYPO 22GX1.5 SAFETY (NEEDLE) ×1 IMPLANT
NEEDLE INSUFFLATION 14GA 120MM (NEEDLE) IMPLANT
NS IRRIG 500ML POUR BTL (IV SOLUTION) ×1 IMPLANT
PACK LAP CHOLECYSTECTOMY (MISCELLANEOUS) ×1 IMPLANT
SEAL CANN UNIV 5-8 DVNC XI (MISCELLANEOUS) ×4 IMPLANT
SEAL XI 5MM-8MM UNIVERSAL (MISCELLANEOUS) ×4
SET TUBE SMOKE EVAC HIGH FLOW (TUBING) ×1 IMPLANT
SOLUTION ELECTROLUBE (MISCELLANEOUS) ×1 IMPLANT
SPIKE FLUID TRANSFER (MISCELLANEOUS) ×1 IMPLANT
SUT MNCRL 4-0 (SUTURE) ×1
SUT MNCRL 4-0 27XMFL (SUTURE) ×1
SUT VICRYL 0 UR6 27IN ABS (SUTURE) ×1 IMPLANT
SUTURE MNCRL 4-0 27XMF (SUTURE) ×1 IMPLANT
SYS BAG RETRIEVAL 10MM (BASKET) ×1
SYSTEM BAG RETRIEVAL 10MM (BASKET) ×1 IMPLANT
TRAP FLUID SMOKE EVACUATOR (MISCELLANEOUS) ×1 IMPLANT
TROCAR Z-THREAD FIOS 11X100 BL (TROCAR) IMPLANT
WATER STERILE IRR 500ML POUR (IV SOLUTION) ×1 IMPLANT

## 2022-08-05 NOTE — ED Notes (Signed)
Complains of being cold. Warm blanket given. States nausea is starting to come back and the pain is a 7. Alert and oriented. Sitting on side of bed.

## 2022-08-05 NOTE — ED Notes (Addendum)
Discussed meds with Dr. Claudine Mouton - Ok'd to administer IC-Green at this time. Med requested from pharm.

## 2022-08-05 NOTE — Anesthesia Preprocedure Evaluation (Signed)
Anesthesia Evaluation  Patient identified by MRN, date of birth, ID band Patient awake    Reviewed: Allergy & Precautions, NPO status , Patient's Chart, lab work & pertinent test results  History of Anesthesia Complications Negative for: history of anesthetic complications  Airway Mallampati: III  TM Distance: <3 FB Neck ROM: full    Dental  (+) Chipped, Poor Dentition, Missing   Pulmonary neg shortness of breath, asthma , sleep apnea    Pulmonary exam normal        Cardiovascular Exercise Tolerance: Good hypertension, (-) angina (-) Past MI negative cardio ROS Normal cardiovascular exam     Neuro/Psych Seizures -, Well Controlled,  PSYCHIATRIC DISORDERS       Neuromuscular disease CVA (right side), Residual Symptoms    GI/Hepatic negative GI ROS, Neg liver ROS,,,  Endo/Other  negative endocrine ROSdiabetes, Type 2, Insulin Dependent    Renal/GU      Musculoskeletal   Abdominal   Peds  Hematology negative hematology ROS (+)   Anesthesia Other Findings Past Medical History: No date: Anemia     Comment:  not currently under treatment No date: Anxiety No date: Asthma     Comment:  during allergy season No date: Diabetes mellitus without complication (HCC) No date: Hypertension No date: Vertigo  Past Surgical History: 2015: CARPAL TUNNEL RELEASE; Right 05/17/2018: KNEE ARTHROSCOPY; Left     Comment:  Procedure: ARTHROSCOPY KNEE WITH LATERAL RELEASE,               PARTIAL SYNOVECTOMY;  Surgeon: Kennedy Bucker, MD;                Location: [facility];  Service: Orthopedics;  Laterality:               Left; 12/28/2017: KNEE ARTHROSCOPY WITH LATERAL RELEASE; Right     Comment:  Procedure: KNEE ARTHROSCOPY WITH LATERAL RELEASE;                Surgeon: Kennedy Bucker, MD;  Location: [facility];                Service: Orthopedics;  Laterality: Right; 06/01/2017: TIBIAL TUBERCLERPLASTY; Bilateral     Comment:  Procedure:  TIBIAL TUBERCLE SPUR EXCISION;  Surgeon:               Kennedy Bucker, MD;  Location: [facility];  Service:               Orthopedics;  Laterality: Bilateral;  BMI    Body Mass Index: 38.41 kg/m      Reproductive/Obstetrics negative OB ROS                             Anesthesia Physical Anesthesia Plan  ASA: 3  Anesthesia Plan: General ETT and Rapid Sequence   Post-op Pain Management:    Induction: Intravenous  PONV Risk Score and Plan: Ondansetron, Dexamethasone, Midazolam and Treatment may vary due to age or medical condition  Airway Management Planned: Oral ETT  Additional Equipment:   Intra-op Plan:   Post-operative Plan: Extubation in OR  Informed Consent: I have reviewed the patients History and Physical, chart, labs and discussed the procedure including the risks, benefits and alternatives for the proposed anesthesia with the patient or authorized representative who has indicated his/her understanding and acceptance.     Dental Advisory Given  Plan Discussed with: Anesthesiologist, CRNA and Surgeon  Anesthesia Plan Comments: (Patient consented for risks  of anesthesia including but not limited to:  - adverse reactions to medications - damage to eyes, teeth, lips or other oral mucosa - nerve damage due to positioning  - sore throat or hoarseness - Damage to heart, brain, nerves, lungs, other parts of body or loss of life  Patient voiced understanding.)       Anesthesia Quick Evaluation

## 2022-08-05 NOTE — ED Notes (Signed)
Patient sitting on side of bed. Clothes removed and placed in a gown. Socks placed on. IV Antibiotic started per order and Zofran given for nausea. Patient signed consent. Husband in room and clothes bag given to husband.

## 2022-08-05 NOTE — Op Note (Signed)
Robotic cholecystectomy with Indocyamine Green Ductal Imaging.   Pre-operative Diagnosis: Chronic calculus cholecystitis  Post-operative Diagnosis:  Same.  Procedure: Robotic assisted laparoscopic cholecystectomy with Indocyamine Green Ductal Imaging.   Surgeon: Campbell Lerner, M.D., FACS  Anesthesia: General. with endotracheal tube  Findings: two large stones, fully decompressed GB, well identified CD and junction with common ducts  Estimated Blood Loss: 15 mL         Drains: None         Specimens: Gallbladder           Complications: none  Procedure Details  The patient was seen again in the Holding Room.  1.25 mg dose of ICG was administered intravenously.   The benefits, complications, treatment options, risks and expected outcomes were again reviewed with the patient. The likelihood of improving the patient's symptoms with return to their baseline status is good.  The patient and/or family concurred with the proposed plan, giving informed consent, again alternatives reviewed.  The patient was taken to Operating Room, identified, and the procedure verified as robotic assisted laparoscopic cholecystectomy.  Prior to the induction of general anesthesia, antibiotic prophylaxis was administered. VTE prophylaxis was in place. General endotracheal anesthesia was then administered and tolerated well. The patient was positioned in the supine position.  After the induction, the abdomen was prepped with Chloraprep and draped in the sterile fashion.  A Time Out was held and the above information confirmed.  After local infiltration of quarter percent Marcaine with epinephrine, stab incision was made left upper quadrant.  Just below the costal margin at Palmer's point, approximately midclavicular line the Veres needle is passed with sensation of the layers to penetrate the abdominal wall and into the peritoneum.  Saline drop test is confirmed peritoneal placement.  Insufflation is initiated  with carbon dioxide to pressures of 15 mmHg.  Right para-umbilical local infiltration with quarter percent Marcaine with epinephrine is utilized.  Made a 12 mm incision on the right periumbilical site, I advanced an optical 49mm port under direct visualization into the peritoneal cavity.  Once the peritoneum was penetrated, insufflation was initiated.  The trocar was then advanced into the abdominal cavity under direct visualization. Pneumoperitoneum was then continued utilizing CO2 at 15 mmHg or less and tolerated well without any adverse changes in the patient's vital signs.  Two 8.5-mm ports were placed in the left lower quadrant and laterally, and one to the right lower quadrant, all under direct vision. All skin incisions  were infiltrated with a local anesthetic agent before making the incision and placing the trocars.  The patient was positioned  in reverse Trendelenburg, tilted the patient's left side down.  Da Vinci XI robot was then positioned on to the patient's left side, and docked.  The gallbladder was identified, the fundus grasped via the arm 4 Prograsp and retracted cephalad. Adhesions were lysed with scissors and cautery.  The infundibulum was identified grasped and retracted laterally, exposing the peritoneum overlying the triangle of Calot. This was then opened and dissected using cautery & scissors. An extended critical view of the cystic duct and cystic artery was obtained, aided by the ICG via FireFly which improved localization of the ductal anatomy.    The cystic duct was clearly identified and dissected to isolation.   Artery well isolated and clipped, and the cystic duct was triple clipped and divided with scissors, as close to the gallbladder neck as feasible, thus leaving two on the remaining stump.  The specimen side of the artery  is sealed with bipolar and divided with monopolar scissors.   The gallbladder was taken from the gallbladder fossa in a retrograde fashion with the  electrocautery. The gallbladder was removed and placed in an Endocatch bag.  The liver bed is inspected. Hemostasis was confirmed.  The robot was undocked and moved away from the operative field. No irrigation was utilized. The gallbladder and Endocatch sac were then removed through the infraumbilical port site.   Inspection of the right upper quadrant was performed. No bleeding, bile duct injury or leak, or bowel injury was noted. The infra-umbilical port site fascia was closed with interrumpted 0 Vicryl sutures using PMI/cone under direct visualization. Pneumoperitoneum was released and ports removed.  4-0 subcuticular Monocryl was used to close the skin. Dermabond was  applied.  The patient was then extubated and brought to the recovery room in stable condition. Sponge, lap, and needle counts were correct at closure and at the conclusion of the case.               Ronny Bacon, M.D., [facility] 08/05/2022 10:30 AM

## 2022-08-05 NOTE — H&P (Signed)
Patient ID: Lauren Velazquez, female   DOB: January 26, 1961, 61 y.o.   MRN: [id number]  Chief Complaint:  RUQ pain.   History of Present Illness Lauren Velazquez is a 61 y.o. female with history of gallstones noted at least last month on a prior ED visit. Postprandial pain in the RUQ with radiation to the back.  This attack has been unrelenting for the last 3 days. Denies f/c, and vomitting.  Past Medical History Past Medical History:  Diagnosis Date   Anemia    not currently under treatment   Anxiety    Asthma    during allergy season   Diabetes mellitus without complication (HCC)    Hypertension    Vertigo       Past Surgical History:  Procedure Laterality Date   CARPAL TUNNEL RELEASE Right 2015   KNEE ARTHROSCOPY Left 05/17/2018   Procedure: ARTHROSCOPY KNEE WITH LATERAL RELEASE, PARTIAL SYNOVECTOMY;  Surgeon: Kennedy Bucker, MD;  Location: [facility];  Service: Orthopedics;  Laterality: Left;   KNEE ARTHROSCOPY WITH LATERAL RELEASE Right 12/28/2017   Procedure: KNEE ARTHROSCOPY WITH LATERAL RELEASE;  Surgeon: Kennedy Bucker, MD;  Location: [facility];  Service: Orthopedics;  Laterality: Right;   TIBIAL TUBERCLERPLASTY Bilateral 06/01/2017   Procedure: TIBIAL TUBERCLE SPUR EXCISION;  Surgeon: Kennedy Bucker, MD;  Location: [facility];  Service: Orthopedics;  Laterality: Bilateral;    Allergies  Allergen Reactions   Atorvastatin Swelling    Current Facility-Administered Medications  Medication Dose Route Frequency Provider Last Rate Last Admin   lactated ringers infusion   Intravenous Continuous Campbell Lerner, MD       Mitzi Hansen Hold] promethazine (PHENERGAN) 25 mg in sodium chloride 0.9 % 50 mL IVPB  25 mg Intravenous Q6H PRN Sharman Cheek, MD   Stopped at 08/05/22 0244    Family History Family History  Problem Relation Age of Onset   Seizures Sister    Seizures Brother       Social History Social History   Tobacco Use   Smoking status: Never   Smokeless tobacco: Never  Vaping  Use   Vaping Use: Never used  Substance Use Topics   Alcohol use: No   Drug use: No       Physical Exam Blood pressure (!) 188/85, pulse 76, temperature 98 F (36.7 C), temperature source Oral, resp. rate 16, height 5\' 2"  (1.575 m), weight 95.3 kg, last menstrual period 08/15/2008, SpO2 99 %. Last Weight  Most recent update: 08/05/2022  8:40 AM    Weight  95.3 kg (210 lb)             CONSTITUTIONAL: Well developed, and nourished, appropriately responsive and aware without distress.   EYES: Sclera non-icteric.   EARS, NOSE, MOUTH AND THROAT:  The oropharynx is clear. Oral mucosa is pink and moist.   Hearing is intact to voice.  NECK: Trachea is midline, and there is no jugular venous distension.  LYMPH NODES:  Lymph nodes in the neck are not appreciated. RESPIRATORY:  Normal respiratory effort without pathologic use of accessory muscles. CARDIOVASCULAR:   Well perfused.  GI: The abdomen is tender in the RUQ, o/w  soft, nontender, and nondistended. There were no palpable masses. I did not appreciate hepatosplenomegaly. There were normal bowel sounds. MUSCULOSKELETAL:  Symmetrical muscle tone appreciated in all four extremities.    SKIN: Skin turgor is normal. No pathologic skin lesions appreciated.  NEUROLOGIC:  Motor and sensation appear grossly normal.  Cranial nerves are grossly without defect. PSYCH:  Alert  and oriented to person, place and time. Affect is appropriate for situation.  Data Reviewed I have personally reviewed what is currently available of the patient's imaging, recent labs and medical records.   Labs:     Latest Ref Rng & Units 08/05/2022    1:05 AM 06/30/2022    3:54 AM 02/26/2022   10:01 AM  CBC  WBC 4.0 - 10.5 K/uL 8.4  10.4  9.3   Hemoglobin 12.0 - 15.0 g/dL 9.1  49.4  9.0   Hematocrit 36.0 - 46.0 % 31.2  35.5  30.4   Platelets 150 - 400 K/uL 259  319  453       Latest Ref Rng & Units 08/05/2022    1:05 AM 06/30/2022    3:54 AM 02/27/2022    10:39 AM  CMP  Glucose 70 - 99 mg/dL 496  759  163   BUN 6 - 20 mg/dL 13  11  11    Creatinine 0.44 - 1.00 mg/dL  8.46  6.59   Sodium 135 - 145 mmol/L 139  140  139   Potassium 3.5 - 5.1 mmol/L 3.9  3.6  4.0   Chloride 98 - 111 mmol/L 107  105  106   CO2 22 - 32 mmol/L 27  25  25    Calcium 8.9 - 10.3 mg/dL 8.8  9.4  8.9   Total Protein 6.5 - 8.1 g/dL 7.4  8.0    Total Bilirubin 0.3 - 1.2 mg/dL 0.5  0.5    Alkaline Phos 38 - 126 U/L 92  100    AST 15 - 41 U/L 11  16    ALT 0 - 44 U/L 12  13        Imaging: Radiological images reviewed:   Within last 24 hrs: 9.35 ABDOMEN LIMITED RUQ (LIVER/GB)  Result Date: 08/05/2022 CLINICAL DATA:  ruq pain EXAM: ULTRASOUND ABDOMEN LIMITED RIGHT UPPER QUADRANT COMPARISON:  CT abdomen pelvis 06/20/2021 FINDINGS: Gallbladder: Calcified gallstones within the gallbladder lumen. No gallbladder wall thickening or pericholecystic fluid visualized. No sonographic Murphy sign noted by sonographer. Common bile duct: Diameter: 10 mm. Liver: No focal lesion identified. Increased parenchymal echogenicity. Portal vein is patent on color Doppler imaging with normal direction of blood flow towards the liver. Other: None. IMPRESSION: 1. Hepatic steatosis. Please note limited evaluation for focal hepatic masses in a patient with hepatic steatosis due to decreased penetration of the acoustic ultrasound waves. 2. Cholelithiasis with no acute cholecystitis. 3. Mildly dilated common bile duct. Visualized portions the common bowel duct demonstrates no choledocholithiasis. Consider MRCP if clinically indicated. Electronically Signed   By: 08/07/2022 M.D.   On: 08/05/2022 03:59    Assessment    Chronic calculus cholecystitis with biliary colic  Patient Active Problem List   Diagnosis Date Noted   Major depressive disorder, recurrent severe without psychotic features (HCC) 02/27/2022   Hypophosphatemia 02/25/2022   Hypokalemia 02/25/2022   Overdose 02/24/2022    Acetaminophen overdose, intentional self-harm, initial encounter (HCC) 02/23/2022   Suicidal behavior with attempted self-injury (HCC) 02/23/2022   Nausea vomiting and diarrhea 02/23/2022   Abdominal pain 02/23/2022   Chest pain 02/23/2022   Depression with anxiety    Leukocytosis    Normocytic anemia    Obesity with body mass index of 30.0-39.9    Refractory nausea and vomiting 02/20/2022   Acute metabolic encephalopathy 07/14/2021   DVT (deep venous thrombosis) (HCC) 07/14/2021   Vaginal bleeding, abnormal 07/14/2021   Restrictive lung  disease 07/14/2021   OSA (obstructive sleep apnea) 07/14/2021   Cerebrovascular accident (CVA) (HCC)    Seizure (HCC) 06/30/2021   Respiratory insufficiency    Encephalopathy acute    Acute delirium 06/21/2021   Altered mental status 06/20/2021   Diabetes mellitus without complication (HCC) 06/20/2021   Acute sepsis (HCC)    Pneumonia due to COVID-19 virus 07/15/2020   Acute respiratory failure due to COVID-19 (HCC) 07/15/2020   Obesity, Class III, BMI 40-49.9 (morbid obesity) (HCC) 07/15/2020   Chronic prescription opiate use 07/15/2020   Bacterial pneumonia 07/15/2020   Acute hypoxemic respiratory failure due to COVID-19 (HCC) 07/15/2020   Suspected COVID-19 virus infection 03/06/2020   Elevated troponin 03/06/2020   Acute hypoxemic respiratory failure (HCC) 03/05/2020   Diabetes mellitus type 2, uncomplicated (HCC) 05/30/2014   Hyperlipidemia 05/30/2014   Hypertension 05/30/2014   Obesity 05/30/2014   Vertigo 05/30/2014   Carpal tunnel syndrome on both sides 12/27/2013    Plan    This was discussed thoroughly.   Optimal plan is for robotic cholecystectomy utilizing ICG imaging. Risks and benefits have been discussed with the patient which include but are not limited to anesthesia, bleeding, infection, biliary ductal injury, resulting in leak or stenosis, other associated unanticipated injuries affiliated with laparoscopic surgery.    Reviewed that removing the gallbladder will only address the symptoms related to the gallbladder itself.  I believe there is the desire to proceed, accepting the risks with understanding.  Questions elicited and answered to satisfaction.    No guarantees ever expressed or implied.   Face-to-face time spent with the patient and accompanying care providers(if present) was 30 minutes, with more than 50% of the time spent counseling, educating, and coordinating care of the patient.    These notes generated with voice recognition software. I apologize for typographical errors.  Campbell Lerner M.D., FACS 08/05/2022, 8:55 AM

## 2022-08-05 NOTE — ED Triage Notes (Signed)
Patient ambulatory to triage with steady gait, without difficulty or distress noted; pt reports rt upper abd pain/side and into neck accomp by N/V; st hx gallstones

## 2022-08-05 NOTE — ED Notes (Signed)
US at bedside

## 2022-08-05 NOTE — Interval H&P Note (Signed)
History and Physical Interval Note:  08/05/2022 9:03 AM  Lauren Velazquez  has presented today for surgery, with the diagnosis of chronic calculus cholecystitis.  The various methods of treatment have been discussed with the patient and family. After consideration of risks, benefits and other options for treatment, the patient has consented to  Procedure(s): XI ROBOTIC ASSISTED LAPAROSCOPIC CHOLECYSTECTOMY (N/A) as a surgical intervention.  The patient's history has been reviewed, patient examined, no change in status, stable for surgery.  I have reviewed the patient's chart and labs.  Questions were answered to the patient's satisfaction.     Campbell Lerner

## 2022-08-05 NOTE — ED Notes (Signed)
Pt refusing [place] until provided "stronger" pain meds. EDP notified.

## 2022-08-05 NOTE — ED Notes (Signed)
Pt states her last meal/drink was 1500 on 12/21.

## 2022-08-05 NOTE — ED Notes (Signed)
US @ the bedside. 

## 2022-08-05 NOTE — Anesthesia Postprocedure Evaluation (Signed)
Anesthesia Post Note  Patient: Lauren Velazquez  Procedure(s) Performed: XI ROBOTIC ASSISTED LAPAROSCOPIC CHOLECYSTECTOMY  Patient location during evaluation: PACU Anesthesia Type: General Level of consciousness: awake and alert Pain management: pain level controlled Vital Signs Assessment: post-procedure vital signs reviewed and stable Respiratory status: spontaneous breathing, nonlabored ventilation, respiratory function stable and patient connected to nasal cannula oxygen Cardiovascular status: blood pressure returned to baseline and stable Postop Assessment: no apparent nausea or vomiting Anesthetic complications: no   No notable events documented.   Last Vitals:  Vitals:   08/05/22 1215 08/05/22 1232  BP: (!) 152/95 (!) 160/85  Pulse: 76 81  Resp: (!) 24 18  Temp:  36.4 C  SpO2: 100% 99%    Last Pain:  Vitals:   08/05/22 1232  TempSrc: Temporal  PainSc: 5                  Cleda Mccreedy Rewa Weissberg

## 2022-08-05 NOTE — Discharge Instructions (Addendum)
AMBULATORY SURGERY  DISCHARGE INSTRUCTIONS   The drugs that you were given will stay in your system until tomorrow so for the next 24 hours you should not:  Drive an automobile Make any legal decisions Drink any alcoholic beverage   You may resume regular meals tomorrow.  Today it is better to start with liquids and gradually work up to solid foods.  You may eat anything you prefer, but it is better to start with liquids, then soup and crackers, and gradually work up to solid foods.   Please notify your doctor immediately if you have any unusual bleeding, trouble breathing, redness and pain at the surgery site, drainage, fever, or pain not relieved by medication.    Additional Instructions:        Please contact your physician with any problems or Same Day Surgery at [phone], Monday through Friday 6 am to 4 pm, or [place] [facility] Main number at [phone]. AMBULATORY SURGERY  DISCHARGE INSTRUCTIONS   The drugs that you were given will stay in your system until tomorrow so for the next 24 hours you should not:  Drive an automobile Make any legal decisions Drink any alcoholic beverage   You may resume regular meals tomorrow.  Today it is better to start with liquids and gradually work up to solid foods.  You may eat anything you prefer, but it is better to start with liquids, then soup and crackers, and gradually work up to solid foods.   Please notify your doctor immediately if you have any unusual bleeding, trouble breathing, redness and pain at the surgery site, drainage, fever, or pain not relieved by medication.    Additional Instructions:        Please contact your physician with any problems or Same Day Surgery at [phone], Monday through Friday 6 am to 4 pm, or [place] [facility] [place] Main number at [phone].  

## 2022-08-05 NOTE — ED Notes (Signed)
OR transport here to transport patient to OR.via stretcher. Husband walking with patient with belongings. Patient alert and oriented.

## 2022-08-05 NOTE — ED Provider Notes (Signed)
[facility] Provider Note    Event Date/Time   First MD Initiated Contact with Patient 08/05/22 0138     (approximate)   History   Chief Complaint: Abdominal Pain   HPI  Lauren Velazquez is a 61 y.o. female with a history of hypertension, diabetes, anxiety, DVT who is brought to the ED due to right upper quadrant pain radiating to the back.  Severe, ongoing for the past 3 days.  Worse with eating, no alleviating factors.  Associated with nausea and vomiting.  No fever.  Reports a history of gallstones.  No exertional symptoms.  No dizziness or syncope.     Physical Exam   Triage Vital Signs: ED Triage Vitals  Enc Vitals Group     BP 08/05/22 0104 (!) 167/75     Pulse Rate 08/05/22 0104 80     Resp 08/05/22 0104 20     Temp 08/05/22 0104 97.7 F (36.5 C)     Temp Source 08/05/22 0104 Oral     SpO2 08/05/22 0104 97 %     Weight 08/05/22 0051 210 lb (95.3 kg)     Height 08/05/22 0051 5\' 2"  (1.575 m)     Head Circumference --      Peak Flow --      Pain Score 08/05/22 0051 9     Pain Loc --      Pain Edu? --      Excl. in [place]? --     Most recent vital signs: Vitals:   08/05/22 0104 08/05/22 0455  BP: (!) 167/75 139/60  Pulse: 80 63  Resp: 20 20  Temp: 97.7 F (36.5 C) 97.7 F (36.5 C)  SpO2: 97% 95%    General: Awake, no distress.  CV:  Good peripheral perfusion.  Regular rate rhythm Resp:  Normal effort.  Clear to auscultation bilaterally Abd:  No distention.  Soft with right upper quadrant tenderness, positive Murphy sign.  Not peritoneal. Other:  No lower extremity edema.  Normal mental status.   ED Results / Procedures / Treatments   Labs (all labs ordered are listed, but only abnormal results are displayed) Labs Reviewed  CBC WITH DIFFERENTIAL/PLATELET - Abnormal; Notable for the following components:      Result Value   RBC 3.86 (*)    Hemoglobin 9.1 (*)    HCT 31.2 (*)    MCH 23.6 (*)    MCHC 29.2 (*)    All other  components within normal limits  COMPREHENSIVE METABOLIC PANEL - Abnormal; Notable for the following components:   Glucose, Bld 208 (*)    Calcium 8.8 (*)    Albumin 3.4 (*)    AST 11 (*)    All other components within normal limits  URINALYSIS, ROUTINE W REFLEX MICROSCOPIC - Abnormal; Notable for the following components:   Color, Urine YELLOW (*)    APPearance HAZY (*)    All other components within normal limits  RESP PANEL BY RT-PCR (RSV, FLU A&B, COVID)  RVPGX2  LIPASE, BLOOD  TROPONIN I (HIGH SENSITIVITY)  TROPONIN I (HIGH SENSITIVITY)     EKG Interpreted by me Normal sinus rhythm rate of 81.  Normal axis intervals QRS ST segments and T waves.   RADIOLOGY Ultrasound right upper quadrant interpreted by me, shows 1.5 cm gallstone.  No frank sonographic signs of cholecystitis.   PROCEDURES:  Procedures   MEDICATIONS ORDERED IN ED: Medications  promethazine (PHENERGAN) 25 mg in sodium chloride 0.9 % 50  mL IVPB (0 mg Intravenous Stopped 08/05/22 0244)  sodium chloride 0.9 % bolus 1,000 mL (0 mLs Intravenous Stopped 08/05/22 0437)  ketorolac (TORADOL) 15 MG/ML injection 15 mg (15 mg Intravenous Given 08/05/22 0229)  HYDROmorphone (DILAUDID) injection 1 mg (1 mg Intravenous Given 08/05/22 0301)     IMPRESSION / MDM / ASSESSMENT AND PLAN / ED COURSE  I reviewed the triage vital signs and the nursing notes.                              Differential diagnosis includes, but is not limited to, choledocholithiasis, cholecystitis, pancreatitis, GERD, viral illness, dehydration, AKI, electrolyte abnormality  Patient's presentation is most consistent with acute presentation with potential threat to life or bodily function.  Patient with known gallstones presents with symptoms concerning for cholecystitis.  Vital signs are normal, not septic.  No leukocytosis.  LFTs and lipase are normal.  Will obtain right upper quadrant ultrasound.  Toradol Phenergan and Dilaudid IV for  initial symptom relief.   ----------------------------------------- 5:13 AM on 08/05/2022 ----------------------------------------- Patient still having 8/10 right upper quadrant pain.  Still quite tender in the right upper quadrant.  No peritonitis.  Case discussed with surgery Dr. Claudine Mouton who will evaluate.  Will keep n.p.o. for now      FINAL CLINICAL IMPRESSION(S) / ED DIAGNOSES   Final diagnoses:  Calculus of gallbladder without cholecystitis without obstruction  Type 2 diabetes mellitus without complication, with long-term current use of insulin (HCC)  Morbid obesity (HCC)     Rx / DC Orders   ED Discharge Orders     None        Note:  This document was prepared using Dragon voice recognition software and may include unintentional dictation errors.   Sharman Cheek, MD 08/05/22 [phone]

## 2022-08-05 NOTE — Transfer of Care (Signed)
Immediate Anesthesia Transfer of Care Note  Patient: Lauren Velazquez  Procedure(s) Performed: XI ROBOTIC ASSISTED LAPAROSCOPIC CHOLECYSTECTOMY  Patient Location: PACU  Anesthesia Type:General  Level of Consciousness: awake and alert   Airway & Oxygen Therapy: Patient Spontanous Breathing and Patient connected to face mask oxygen  Post-op Assessment: Report given to RN and Post -op Vital signs reviewed and stable  Post vital signs: Reviewed and stable  Last Vitals:  Vitals Value Taken Time  BP 160/82 08/05/22 1035  Temp    Pulse 74 08/05/22 1035  Resp 19 08/05/22 1035  SpO2 100 % 08/05/22 1035  Vitals shown include unvalidated device data.  Last Pain:  Vitals:   08/05/22 0839  TempSrc: Oral  PainSc: 9          Complications: No notable events documented.

## 2022-08-05 NOTE — Anesthesia Procedure Notes (Addendum)
Procedure Name: Intubation Date/Time: 08/05/2022 9:30 AM  Performed by: Ginger Carne, CRNAPre-anesthesia Checklist: Patient identified, Emergency Drugs available, Suction available, Patient being monitored and Timeout performed Patient Re-evaluated:Patient Re-evaluated prior to induction Oxygen Delivery Method: Circle system utilized Preoxygenation: Pre-oxygenation with 100% oxygen Induction Type: IV induction Ventilation: Mask ventilation without difficulty Laryngoscope Size: McGraph and 3 Grade View: Grade I Tube type: Oral Tube size: 6.5 mm Number of attempts: 1 Airway Equipment and Method: Stylet and Video-laryngoscopy Placement Confirmation: ETT inserted through vocal cords under direct vision, positive ETCO2 and breath sounds checked- equal and bilateral Secured at: 19 cm Tube secured with: Tape Dental Injury: Teeth and Oropharynx as per pre-operative assessment

## 2022-08-07 ENCOUNTER — Emergency Department: Payer: 59

## 2022-08-07 ENCOUNTER — Other Ambulatory Visit: Payer: Self-pay

## 2022-08-07 ENCOUNTER — Inpatient Hospital Stay
Admission: EM | Admit: 2022-08-07 | Discharge: 2022-08-08 | DRG: 988 | Discharge status: Against Medical Advice | Payer: 59 | Attending: Family Medicine | Admitting: Family Medicine

## 2022-08-07 DIAGNOSIS — Z6838 Body mass index (BMI) 38.0-38.9, adult: Secondary | ICD-10-CM | POA: Diagnosis not present

## 2022-08-07 DIAGNOSIS — F32A Depression, unspecified: Secondary | ICD-10-CM | POA: Diagnosis present

## 2022-08-07 DIAGNOSIS — H538 Other visual disturbances: Secondary | ICD-10-CM | POA: Diagnosis present

## 2022-08-07 DIAGNOSIS — Z9151 Personal history of suicidal behavior: Secondary | ICD-10-CM

## 2022-08-07 DIAGNOSIS — G40909 Epilepsy, unspecified, not intractable, without status epilepticus: Secondary | ICD-10-CM | POA: Diagnosis present

## 2022-08-07 DIAGNOSIS — Z794 Long term (current) use of insulin: Secondary | ICD-10-CM | POA: Diagnosis not present

## 2022-08-07 DIAGNOSIS — F419 Anxiety disorder, unspecified: Secondary | ICD-10-CM | POA: Diagnosis present

## 2022-08-07 DIAGNOSIS — Z5329 Procedure and treatment not carried out because of patient's decision for other reasons: Secondary | ICD-10-CM | POA: Diagnosis present

## 2022-08-07 DIAGNOSIS — R296 Repeated falls: Secondary | ICD-10-CM | POA: Diagnosis present

## 2022-08-07 DIAGNOSIS — D649 Anemia, unspecified: Secondary | ICD-10-CM | POA: Diagnosis present

## 2022-08-07 DIAGNOSIS — Z888 Allergy status to other drugs, medicaments and biological substances status: Secondary | ICD-10-CM

## 2022-08-07 DIAGNOSIS — F418 Other specified anxiety disorders: Secondary | ICD-10-CM | POA: Diagnosis present

## 2022-08-07 DIAGNOSIS — G4733 Obstructive sleep apnea (adult) (pediatric): Secondary | ICD-10-CM | POA: Diagnosis present

## 2022-08-07 DIAGNOSIS — I6381 Other cerebral infarction due to occlusion or stenosis of small artery: Principal | ICD-10-CM

## 2022-08-07 DIAGNOSIS — Z7982 Long term (current) use of aspirin: Secondary | ICD-10-CM | POA: Diagnosis not present

## 2022-08-07 DIAGNOSIS — Z79899 Other long term (current) drug therapy: Secondary | ICD-10-CM

## 2022-08-07 DIAGNOSIS — I6389 Other cerebral infarction: Secondary | ICD-10-CM | POA: Diagnosis present

## 2022-08-07 DIAGNOSIS — I10 Essential (primary) hypertension: Secondary | ICD-10-CM | POA: Diagnosis present

## 2022-08-07 DIAGNOSIS — G8918 Other acute postprocedural pain: Secondary | ICD-10-CM | POA: Diagnosis present

## 2022-08-07 DIAGNOSIS — J45909 Unspecified asthma, uncomplicated: Secondary | ICD-10-CM | POA: Diagnosis present

## 2022-08-07 DIAGNOSIS — K5730 Diverticulosis of large intestine without perforation or abscess without bleeding: Secondary | ICD-10-CM | POA: Diagnosis present

## 2022-08-07 DIAGNOSIS — Z9049 Acquired absence of other specified parts of digestive tract: Secondary | ICD-10-CM

## 2022-08-07 DIAGNOSIS — E119 Type 2 diabetes mellitus without complications: Secondary | ICD-10-CM | POA: Diagnosis present

## 2022-08-07 DIAGNOSIS — I639 Cerebral infarction, unspecified: Secondary | ICD-10-CM | POA: Diagnosis not present

## 2022-08-07 DIAGNOSIS — Z8616 Personal history of COVID-19: Secondary | ICD-10-CM

## 2022-08-07 DIAGNOSIS — E785 Hyperlipidemia, unspecified: Secondary | ICD-10-CM | POA: Diagnosis present

## 2022-08-07 DIAGNOSIS — Z7902 Long term (current) use of antithrombotics/antiplatelets: Secondary | ICD-10-CM

## 2022-08-07 DIAGNOSIS — N179 Acute kidney failure, unspecified: Secondary | ICD-10-CM | POA: Diagnosis present

## 2022-08-07 DIAGNOSIS — W19XXXA Unspecified fall, initial encounter: Secondary | ICD-10-CM | POA: Diagnosis present

## 2022-08-07 DIAGNOSIS — G8929 Other chronic pain: Secondary | ICD-10-CM | POA: Diagnosis present

## 2022-08-07 DIAGNOSIS — Z8673 Personal history of transient ischemic attack (TIA), and cerebral infarction without residual deficits: Secondary | ICD-10-CM | POA: Diagnosis not present

## 2022-08-07 DIAGNOSIS — Z1152 Encounter for screening for COVID-19: Secondary | ICD-10-CM | POA: Diagnosis not present

## 2022-08-07 DIAGNOSIS — K8010 Calculus of gallbladder with chronic cholecystitis without obstruction: Secondary | ICD-10-CM | POA: Diagnosis present

## 2022-08-07 DIAGNOSIS — M47816 Spondylosis without myelopathy or radiculopathy, lumbar region: Secondary | ICD-10-CM | POA: Diagnosis present

## 2022-08-07 DIAGNOSIS — Z79891 Long term (current) use of opiate analgesic: Secondary | ICD-10-CM

## 2022-08-07 DIAGNOSIS — Z86718 Personal history of other venous thrombosis and embolism: Secondary | ICD-10-CM | POA: Diagnosis not present

## 2022-08-07 LAB — URINALYSIS, ROUTINE W REFLEX MICROSCOPIC
Bilirubin Urine: NEGATIVE
Glucose, UA: NEGATIVE mg/dL
Ketones, ur: NEGATIVE mg/dL
Leukocytes,Ua: NEGATIVE
Nitrite: NEGATIVE
Protein, ur: 30 mg/dL — AB
Specific Gravity, Urine: 1.016 (ref 1.005–1.030)
pH: 5 (ref 5.0–8.0)

## 2022-08-07 LAB — COMPREHENSIVE METABOLIC PANEL
ALT: 10 U/L (ref 0–44)
AST: 19 U/L (ref 15–41)
Albumin: 3.2 g/dL — ABNORMAL LOW (ref 3.5–5.0)
Alkaline Phosphatase: 80 U/L (ref 38–126)
Anion gap: 5 (ref 5–15)
BUN: 24 mg/dL — ABNORMAL HIGH (ref 6–20)
CO2: 25 mmol/L (ref 22–32)
Calcium: 8.8 mg/dL — ABNORMAL LOW (ref 8.9–10.3)
Chloride: 109 mmol/L (ref 98–111)
Creatinine, Ser: 1.2 mg/dL — ABNORMAL HIGH (ref 0.44–1.00)
GFR, Estimated: 52 mL/min — ABNORMAL LOW (ref >60)
Glucose, Bld: 180 mg/dL — ABNORMAL HIGH (ref 70–99)
Potassium: 4 mmol/L (ref 3.5–5.1)
Sodium: 139 mmol/L (ref 135–145)
Total Bilirubin: 0.8 mg/dL (ref 0.3–1.2)
Total Protein: 6.8 g/dL (ref 6.5–8.1)

## 2022-08-07 LAB — CBC WITH DIFFERENTIAL/PLATELET
Abs Immature Granulocytes: 0.03 10*3/uL (ref 0.00–0.07)
Basophils Absolute: 0.1 10*3/uL (ref 0.0–0.1)
Basophils Relative: 1 %
Eosinophils Absolute: 0.1 10*3/uL (ref 0.0–0.5)
Eosinophils Relative: 2 %
HCT: 30.5 % — ABNORMAL LOW (ref 36.0–46.0)
Hemoglobin: 8.7 g/dL — ABNORMAL LOW (ref 12.0–15.0)
Immature Granulocytes: 0 %
Lymphocytes Relative: 19 %
Lymphs Abs: 1.6 10*3/uL (ref 0.7–4.0)
MCH: 23.5 pg — ABNORMAL LOW (ref 26.0–34.0)
MCHC: 28.5 g/dL — ABNORMAL LOW (ref 30.0–36.0)
MCV: 82.4 fL (ref 80.0–100.0)
Monocytes Absolute: 0.5 10*3/uL (ref 0.1–1.0)
Monocytes Relative: 6 %
Neutro Abs: 5.8 10*3/uL (ref 1.7–7.7)
Neutrophils Relative %: 72 %
Platelets: 295 10*3/uL (ref 150–400)
RBC: 3.7 MIL/uL — ABNORMAL LOW (ref 3.87–5.11)
RDW: 15.9 % — ABNORMAL HIGH (ref 11.5–15.5)
WBC: 8 10*3/uL (ref 4.0–10.5)
nRBC: 0 % (ref 0.0–0.2)

## 2022-08-07 LAB — LIPASE, BLOOD: Lipase: 28 U/L (ref 11–51)

## 2022-08-07 MED ORDER — SODIUM CHLORIDE 0.9 % IV BOLUS
1000.0000 mL | Freq: Once | INTRAVENOUS | Status: AC
  Administered 2022-08-07: 1000 mL via INTRAVENOUS

## 2022-08-07 MED ORDER — GADOBUTROL 1 MMOL/ML IV SOLN
7.5000 mL | Freq: Once | INTRAVENOUS | Status: AC | PRN
  Administered 2022-08-07: 7.5 mL via INTRAVENOUS

## 2022-08-07 MED ORDER — LACOSAMIDE 50 MG PO TABS
50.0000 mg | ORAL_TABLET | Freq: Two times a day (BID) | ORAL | Status: DC
  Administered 2022-08-08: 50 mg via ORAL
  Filled 2022-08-07: qty 1

## 2022-08-07 MED ORDER — HYDROMORPHONE HCL 1 MG/ML IJ SOLN
1.0000 mg | Freq: Once | INTRAMUSCULAR | Status: AC
  Administered 2022-08-07: 1 mg via INTRAVENOUS
  Filled 2022-08-07: qty 1

## 2022-08-07 MED ORDER — SODIUM CHLORIDE 0.9 % IV BOLUS
1000.0000 mL | Freq: Once | INTRAVENOUS | Status: AC
  Administered 2022-08-08: 1000 mL via INTRAVENOUS

## 2022-08-07 MED ORDER — INSULIN GLARGINE-YFGN 100 UNIT/ML ~~LOC~~ SOLN
120.0000 [IU] | Freq: Every day | SUBCUTANEOUS | Status: DC
  Filled 2022-08-07: qty 1.2

## 2022-08-07 MED ORDER — IOHEXOL 300 MG/ML  SOLN
100.0000 mL | Freq: Once | INTRAMUSCULAR | Status: AC | PRN
  Administered 2022-08-07: 100 mL via INTRAVENOUS

## 2022-08-07 MED ORDER — ASPIRIN 81 MG PO CHEW: 81.0000 mg | CHEWABLE_TABLET | Freq: Every day | ORAL | Status: DC

## 2022-08-07 MED ORDER — ACETAMINOPHEN 325 MG PO TABS
650.0000 mg | ORAL_TABLET | ORAL | Status: DC | PRN
  Administered 2022-08-08: 650 mg via ORAL
  Filled 2022-08-07: qty 2

## 2022-08-07 MED ORDER — SODIUM CHLORIDE 0.9 % IV SOLN: INTRAVENOUS | Status: AC

## 2022-08-07 MED ORDER — ACETAMINOPHEN 160 MG/5ML PO SOLN: 650.0000 mg | ORAL | Status: DC | PRN

## 2022-08-07 MED ORDER — STROKE: EARLY STAGES OF RECOVERY BOOK: Freq: Once | Status: DC

## 2022-08-07 MED ORDER — ASPIRIN 300 MG RE SUPP: 300.0000 mg | Freq: Every day | RECTAL | Status: DC

## 2022-08-07 MED ORDER — GADOBUTROL 1 MMOL/ML IV SOLN: 9.0000 mL | Freq: Once | INTRAVENOUS | Status: DC | PRN

## 2022-08-07 MED ORDER — INSULIN ASPART 100 UNIT/ML IJ SOLN
0.0000 [IU] | Freq: Every day | INTRAMUSCULAR | Status: DC
  Administered 2022-08-08: 3 [IU] via SUBCUTANEOUS
  Filled 2022-08-07: qty 1

## 2022-08-07 MED ORDER — CLOPIDOGREL BISULFATE 75 MG PO TABS: 75.0000 mg | ORAL_TABLET | Freq: Every day | ORAL | Status: DC

## 2022-08-07 MED ORDER — EZETIMIBE 10 MG PO TABS: 10.0000 mg | ORAL_TABLET | Freq: Every day | ORAL | Status: DC

## 2022-08-07 MED ORDER — OXYCODONE HCL 5 MG PO TABS
5.0000 mg | ORAL_TABLET | Freq: Four times a day (QID) | ORAL | Status: DC | PRN
  Administered 2022-08-08: 5 mg via ORAL
  Filled 2022-08-07: qty 1

## 2022-08-07 MED ORDER — ASPIRIN 325 MG PO TABS: 325.0000 mg | ORAL_TABLET | Freq: Every day | ORAL | Status: DC

## 2022-08-07 MED ORDER — INSULIN ASPART 100 UNIT/ML IJ SOLN: 0.0000 [IU] | Freq: Three times a day (TID) | INTRAMUSCULAR | Status: DC

## 2022-08-07 MED ORDER — ENOXAPARIN SODIUM 60 MG/0.6ML IJ SOSY
0.5000 mg/kg | PREFILLED_SYRINGE | INTRAMUSCULAR | Status: DC
  Administered 2022-08-08: 47.5 mg via SUBCUTANEOUS
  Filled 2022-08-07: qty 0.6

## 2022-08-07 MED ORDER — ACETAMINOPHEN 650 MG RE SUPP: 650.0000 mg | RECTAL | Status: DC | PRN

## 2022-08-07 MED ORDER — PREGABALIN 50 MG PO CAPS
75.0000 mg | ORAL_CAPSULE | Freq: Three times a day (TID) | ORAL | Status: DC
  Administered 2022-08-08: 75 mg via ORAL
  Filled 2022-08-07: qty 1

## 2022-08-07 MED ORDER — LEVETIRACETAM 500 MG PO TABS
500.0000 mg | ORAL_TABLET | Freq: Two times a day (BID) | ORAL | Status: DC
  Administered 2022-08-08: 500 mg via ORAL
  Filled 2022-08-07: qty 1

## 2022-08-07 MED ORDER — ALBUTEROL SULFATE (2.5 MG/3ML) 0.083% IN NEBU: 2.5000 mg | INHALATION_SOLUTION | Freq: Four times a day (QID) | RESPIRATORY_TRACT | Status: DC | PRN

## 2022-08-07 NOTE — ED Provider Notes (Cosign Needed)
[facility] Provider Note    Event Date/Time   First MD Initiated Contact with Patient 08/07/22 2018     (approximate)   History   Chief Complaint Fall   HPI Lauren Velazquez is a 61 y.o. female, history of type 2 diabetes, hypertension, hyperlipidemia, prior CVAs, anxiety/depression, asthma, presents to the emergency department for evaluation of abdominal pain, blurred vision, and motor deficits.  She states that she recently had her gallbladder removed this past Friday.  Since then she has had persistent pain in the right upper quadrant.  In addition, patient states that she has been unable to walk well and has felt unsteady.  She states that she has been falling frequently since yesterday.  She has additionally been having some blurred vision as well.  Additionally reports that she has been dropping things more than usual recently, but especially so over the past 24 hours.  Denies fever/chills, chest pain, shortness of breath, abdominal pain, nausea/vomiting, diarrhea, hearing changes, numbness/tingling in upper or lower extremities, dizziness/lightheadedness, or weakness.  History Limitations: No limitations.        Physical Exam  Triage Vital Signs: ED Triage Vitals  Enc Vitals Group     BP 08/07/22 1512 107/80     Pulse Rate 08/07/22 1512 87     Resp 08/07/22 1512 18     Temp 08/07/22 1512 98 F (36.7 C)     Temp Source 08/07/22 1512 Oral     SpO2 08/07/22 1512 95 %     Weight 08/07/22 1513 210 lb (95.3 kg)     Height 08/07/22 1513 5\' 2"  (1.575 m)     Head Circumference --      Peak Flow --      Pain Score 08/07/22 1512 5     Pain Loc --      Pain Edu? --      Excl. in GC? --     Most recent vital signs: Vitals:   08/07/22 2045 08/07/22 2204  BP: 133/65   Pulse: 76 77  Resp:  20  Temp:    SpO2: 97% 98%    General: Awake, NAD.  Skin: Warm, dry. No rashes or lesions.  Eyes: PERRL. Conjunctivae normal.  CV: Good peripheral perfusion.   Resp: Normal effort.  Lung sounds are clear bilaterally. Abd: Soft, non-tender. No distention.  Neuro: At baseline. No gross neurological deficits.  Musculoskeletal: Normal ROM of all extremities.  Focused Exam: Gross visual acuity intact.  No facial droop.  No gross neurological deficits.  Normal strength and sensation in both upper and lower extremities.  Normal speech.  Physical Exam    ED Results / Procedures / Treatments  Labs (all labs ordered are listed, but only abnormal results are displayed) Labs Reviewed  COMPREHENSIVE METABOLIC PANEL - Abnormal; Notable for the following components:      Result Value   Glucose, Bld 180 (*)    BUN 24 (*)    Creatinine, Ser 1.20 (*)    Calcium 8.8 (*)    Albumin 3.2 (*)    GFR, Estimated 52 (*)    All other components within normal limits  CBC WITH DIFFERENTIAL/PLATELET - Abnormal; Notable for the following components:   RBC 3.70 (*)    Hemoglobin 8.7 (*)    HCT 30.5 (*)    MCH 23.5 (*)    MCHC 28.5 (*)    RDW 15.9 (*)    All other components within normal limits  URINALYSIS, ROUTINE  W REFLEX MICROSCOPIC - Abnormal; Notable for the following components:   Color, Urine YELLOW (*)    APPearance HAZY (*)    Hgb urine dipstick SMALL (*)    Protein, ur 30 (*)    Bacteria, UA RARE (*)    All other components within normal limits  LIPASE, BLOOD     EKG Sinus rhythm, rate of 82, no ST segment changes, normal QRS, no QT prolongation.    RADIOLOGY  ED Provider Interpretation: I personally reviewed and interpreted these images.  MR Brain W and Wo Contrast  Result Date: 08/07/2022 CLINICAL DATA:  Initial evaluation for neuro deficit, stroke suspected. EXAM: MRI HEAD WITHOUT AND WITH CONTRAST TECHNIQUE: Multiplanar, multiecho pulse sequences of the brain and surrounding structures were obtained without and with intravenous contrast. CONTRAST:  7.31mL GADAVIST GADOBUTROL 1 MMOL/ML IV SOLN COMPARISON:  Prior CT from earlier the same  day. FINDINGS: Brain: Cerebral volume within normal limits. Scattered patchy T2/FLAIR hyperintensity involving the periventricular deep white matter both cerebral hemispheres, consistent with chronic small vessel ischemic disease, mild in nature. Focus of FLAIR hyperintensity involving the high posterior right posterior right parietal lobe, consistent with a chronic infarct, corresponding with hypodensity noted on prior CT. Additional small remote left occipital infarct noted as well. 1.3 cm acute ischemic infarct present within the right thalamus (series 16, image 24). No associated hemorrhage or mass effect. Additional small focus of mild diffusion signal abnormality involving the subcortical left parietal lobe likely reflects a small evolving subacute small vessel infarct (series 16, image 34). No other evidence for acute or subacute ischemia. Gray-white matter differentiation otherwise maintained. No acute or chronic intracranial blood products. No mass lesion, midline shift or mass effect. No hydrocephalus or extra-axial fluid collection. Pituitary gland and suprasellar region within normal limits. Vascular: Major intracranial vascular flow voids are maintained. Skull and upper cervical spine: Cranial junction within normal limits. Diffuse loss of normal bone marrow signal, nonspecific but can be seen with anemia, smoking, obesity, and infiltrative/myelofibrotic marrow processes. No focal marrow replacing lesion. No scalp soft tissue abnormality. Sinuses/Orbits: Globes orbital soft tissues within normal limits. Mild scattered mucosal thickening noted about the paranasal sinuses. No mastoid effusion. Other: None. IMPRESSION: 1. 1.3 cm acute ischemic nonhemorrhagic right thalamic infarct. 2. Additional small focus of mild diffusion signal abnormality involving the subcortical left parietal lobe, likely a small evolving subacute small vessel infarct. 3. Chronic right parietal and left occipital infarcts. The right  parietal infarct accounts for the hypodensity noted on prior CT. 4. Underlying mild chronic microvascular ischemic disease. Electronically Signed   By: Rise Mu M.D.   On: 08/07/2022 20:24   CT ABDOMEN PELVIS W CONTRAST  Result Date: 08/07/2022 CLINICAL DATA:  Abdominal pain, recent cholecystectomy EXAM: CT ABDOMEN AND PELVIS WITH CONTRAST TECHNIQUE: Multidetector CT imaging of the abdomen and pelvis was performed using the standard protocol following bolus administration of intravenous contrast. RADIATION DOSE REDUCTION: This exam was performed according to the departmental dose-optimization program which includes automated exposure control, adjustment of the mA and/or kV according to patient size and/or use of iterative reconstruction technique. CONTRAST:  OMNIPAQUE IOHEXOL 300 MG/ML  SOLN COMPARISON:  Sonogram done on 08/05/2022, CT done on 02/23/2022 FINDINGS: Lower chest: Visualized lower lung fields are clear. Hepatobiliary: Liver measures 19.5 cm in length. There is no dilation of bile ducts. Gallbladder is not seen. Pancreas: There is atrophy.  No focal abnormalities are seen. Spleen: Spleen measures 12.8 cm in maximum diameter. Adrenals/Urinary Tract: Adrenals  are unremarkable. There is no hydronephrosis. There is 6 mm calculus in the midportion of left kidney. There is 1 mm calculus in the lower pole of right kidney. There is 1.6 cm cyst in the lower pole of right kidney. Ureters are not dilated. Urinary bladder is not distended. Stomach/Bowel: Stomach is unremarkable. Small bowel loops are not dilated. Appendix is not dilated. There is no significant wall thickening in colon. Scattered diverticula are seen in colon without signs of focal diverticulitis. Vascular/Lymphatic: Scattered arterial calcifications are seen. Reproductive: There is 5 cm simple appearing cyst in the left adnexa which has not changed significantly since 02/23/2022. Other: There is no ascites or  pneumoperitoneum. There is subcutaneous stranding in the periumbilical region, possibly related to recent laparoscopic cholecystectomy. Musculoskeletal: No acute findings are seen. There is encroachment of neural foramina by bony spurs at the L4-L5 and L5-S1 levels. IMPRESSION: There is no evidence of intestinal obstruction or pneumoperitoneum. There is no hydronephrosis. Status post cholecystectomy. There is abnormal fluid collection in gallbladder fossa. There is no dilation of bile ducts. Bilateral nonobstructing renal stones. Right renal cysts. Diverticulosis of colon without signs of diverticulitis. There is 5 cm smooth marginated simple appearing cyst in the left adnexa. Follow-up sonogram in 6 months may be considered. Enlarged liver and spleen.  Lumbar spondylosis. Other findings as described in the body of the report. Electronically Signed   By: Ernie Avena M.D.   On: 08/07/2022 16:58   CT Head Wo Contrast  Result Date: 08/07/2022 CLINICAL DATA:  Trauma, fall, recent gallbladder surgery EXAM: CT HEAD WITHOUT CONTRAST TECHNIQUE: Contiguous axial images were obtained from the base of the skull through the vertex without intravenous contrast. RADIATION DOSE REDUCTION: This exam was performed according to the departmental dose-optimization program which includes automated exposure control, adjustment of the mA and/or kV according to patient size and/or use of iterative reconstruction technique. COMPARISON:  06/30/2021 FINDINGS: Brain: There is new 1.6 cm area of decreased density in subcortical white matter in the posteromedial right parietal lobe. There are no signs of bleeding within the cortex. Ventricles are not dilated. Cortical sulci are prominent. There is decreased density in periventricular white matter. Vascular: Unremarkable. Skull: No fracture is seen. Sinuses/Orbits: There is mucosal thickening in ethmoid and maxillary sinuses. Other: None. IMPRESSION: There are no signs of bleeding  within the cranium. There is new 1.6 cm area of decreased density in subcortical white matter in the posteromedial right parietal lobe. This may suggest recent or old infarct. Less likely possibility would be a space-occupying lesion with adjacent edema. Follow-up MRI may be considered. Chronic sinusitis. Electronically Signed   By: Ernie Avena M.D.   On: 08/07/2022 16:43    PROCEDURES:  Critical Care performed: N/A.  Procedures    MEDICATIONS ORDERED IN ED: Medications  iohexol (OMNIPAQUE) 300 MG/ML solution 100 mL (100 mLs Intravenous Contrast Given 08/07/22 1617)  gadobutrol (GADAVIST) 1 MMOL/ML injection 7.5 mL (7.5 mLs Intravenous Contrast Given 08/07/22 1924)  sodium chloride 0.9 % bolus 1,000 mL (1,000 mLs Intravenous New Bag/Given 08/07/22 2200)  HYDROmorphone (DILAUDID) injection 1 mg (1 mg Intravenous Given 08/07/22 2202)     IMPRESSION / MDM / ASSESSMENT AND PLAN / ED COURSE  I reviewed the triage vital signs and the nursing notes.                              Differential diagnosis includes, but is not limited to, postoperative  complications, CVA, TIA, urinary tract infection,  ED Course Patient appears well, vitals within normal limits.  Currently endorsing abdominal pain at this time.  Will treat with IV fluids and remove Foley milligrams.  CBC shows no leukocytosis.  Anemia present 8.7, consistent with her baseline.  CMP shows elevated creatinine 1.20 and hyperglycemia with glucose of 180, otherwise no significant electrolyte abnormalities or transaminitis.  Urinalysis shows small amounts of hemoglobin, otherwise no evidence of infection.   Assessment/Plan Patient presents for evaluation of abdominal pain, blurred vision, and motor dysfunction.  In regards to her blurred vision and motor dysfunction, her MRI does show a 1.3 cm acute ischemic, nonhemorrhagic right thalamic infarct, as well as additional small focus of mild diffusion signal abnormality  involving the subcortical left parietal lobe, likely a small evolving subacute small vessel infarct.  She is outside of the window for any interventions at this time, though is currently stable and is not having any progressing symptoms.  Will plan to have neurology follow-up with her in the morning.  In regards to her abdominal pain, she does have a abnormal fluid collection around the gallbladder fossa, otherwise no significant acute findings.  She appears well clinically.  She states that pain is not worsening, but is just persistent.  Will help manage her pain while she is here.  Spoke to the on-call hospitalist, who agreed to admission for observation and neurology follow-up.  Patient's presentation is most consistent with acute presentation with potential threat to life or bodily function.       FINAL CLINICAL IMPRESSION(S) / ED DIAGNOSES   Final diagnoses:  Infarction of right thalamus (HCC)     Rx / DC Orders   ED Discharge Orders     None        Note:  This document was prepared using Dragon voice recognition software and may include unintentional dictation errors.   Varney Daily, [place] 08/07/22 2236

## 2022-08-07 NOTE — ED Triage Notes (Signed)
Pt to er, pt states that she had her gallbladder out on Friday, states that now she is back because she is still having pain at her surgical site, states that she also can't walk well, she has been falling and she is having some blurred vision, states that also she keeps dropping things, states that this has been going on since yesterday.

## 2022-08-07 NOTE — Assessment & Plan Note (Signed)
Creatinine 1.2 up from 0.56 Suspect prerenal related to dehydration/volume loss and perioperative period versus decreased oral intake IV hydration and monitor renal function and avoiding nephrotoxins.

## 2022-08-07 NOTE — Assessment & Plan Note (Signed)
S/p laparoscopic cholecystectomy 08/05/2022 CT abdomen with no evidence of intestinal obstruction or pneumoperitoneum Pain control Surgery consulted Will keep n.p.o. from midnight just in case

## 2022-08-07 NOTE — H&P (Signed)
History and Physical    Patient: Lauren Velazquez CRF:[id number] DOB: 25-Jun-1961 DOA: 08/07/2022 DOS: the patient was seen and examined on 08/07/2022 PCP: Hillery Aldo, MD  Patient coming from: Home  Chief Complaint:  Chief Complaint  Patient presents with  . Fall    HPI: Lauren Velazquez is a 61 y.o. female with medical history significant for depression with anxiety, hypertension, insulin-dependent type 2 diabetes mellitus, anemia, vertigo, obesity with BMI 34.75, seizure disorder, history of DVT of Xarelto x 6 months, OSA, chronic pain with chronic opiate use, and prior strokes, who is s/p uneventful elective laparoscopic cholecystectomy on 12/22,Who presents to the ED with a 1 day history of blurred vision,  falls, dropping things as well as persistent postsurgical pain. ED course and data review: BP 107/80 with otherwise normal vitals.  Labs with normal WBC.  Hemoglobin 8.7 down from average of 9-10.  Creatinine 1.20 up from baseline of 0.56.  Urinalysis unremarkable.  EKG ED, personally viewed and interpreted showing NSR at 83 with no acute ST-T wave changes.  MRI brain showing acute stroke as further detailed below: IMPRESSION: 1. 1.3 cm acute ischemic nonhemorrhagic right thalamic infarct. 2. Additional small focus of mild diffusion signal abnormality involving the subcortical left parietal lobe, likely a small evolving subacute small vessel infarct. 3. Chronic right parietal and left occipital infarcts. The right parietal infarct accounts for the hypodensity noted on prior CT. 4. Underlying mild chronic microvascular ischemic disease.   CT abdomen and pelvis with no apparent concerning postoperative findings as detailed below: IMPRESSION: There is no evidence of intestinal obstruction or pneumoperitoneum. There is no hydronephrosis. Status post cholecystectomy. There is abnormal fluid collection in gallbladder fossa. There is no dilation of bile ducts. Bilateral nonobstructing  renal stones. Right renal cysts. Diverticulosis of colon without signs of diverticulitis. There is 5 cm smooth marginated simple appearing cyst in the left adnexa. Follow-up sonogram in 6 months may be considered. Enlarged liver and spleen.  Lumbar spondylosis. Other findings as described in the body of the report.   Review of Systems: As mentioned in the history of present illness. All other systems reviewed and are negative.  Past Medical History:  Diagnosis Date  . Anemia    not currently under treatment  . Anxiety   . Asthma    during allergy season  . Diabetes mellitus without complication (HCC)   . Hypertension   . Vertigo    Past Surgical History:  Procedure Laterality Date  . CARPAL TUNNEL RELEASE Right 2015  . CHOLECYSTECTOMY    . KNEE ARTHROSCOPY Left 05/17/2018   Procedure: ARTHROSCOPY KNEE WITH LATERAL RELEASE, PARTIAL SYNOVECTOMY;  Surgeon: Kennedy Bucker, MD;  Location: [facility];  Service: Orthopedics;  Laterality: Left;  . KNEE ARTHROSCOPY WITH LATERAL RELEASE Right 12/28/2017   Procedure: KNEE ARTHROSCOPY WITH LATERAL RELEASE;  Surgeon: Kennedy Bucker, MD;  Location: [facility];  Service: Orthopedics;  Laterality: Right;  . TIBIAL TUBERCLERPLASTY Bilateral 06/01/2017   Procedure: TIBIAL TUBERCLE SPUR EXCISION;  Surgeon: Kennedy Bucker, MD;  Location: [facility];  Service: Orthopedics;  Laterality: Bilateral;   Social History:  reports that she has never smoked. She has never used smokeless tobacco. She reports that she does not drink alcohol and does not use drugs.  Allergies  Allergen Reactions  . Atorvastatin Swelling    Family History  Problem Relation Age of Onset  . Seizures Sister   . Seizures Brother     Prior to Admission medications   Medication Sig Start Date  End Date Taking? Authorizing Provider  albuterol (VENTOLIN HFA) 108 (90 Base) MCG/ACT inhaler Inhale 2 puffs into the lungs every 6 (six) hours as needed. 05/08/19   [provider]   amLODipine (NORVASC) 5 MG tablet Take 1 tablet (5 mg total) by mouth daily. 07/20/21   Osvaldo Shipper, MD  aspirin 81 MG chewable tablet Chew 1 tablet (81 mg total) by mouth daily. 02/28/22   Marrion Coy, MD  ezetimibe (ZETIA) 10 MG tablet Take 1 tablet (10 mg total) by mouth daily. 07/20/21   Osvaldo Shipper, MD  ferrous sulfate 325 (65 FE) MG tablet Take 1 tablet (325 mg total) by mouth 2 (two) times daily with a meal. 07/20/21   Osvaldo Shipper, MD  ibuprofen (ADVIL) 800 MG tablet Take 1 tablet (800 mg total) by mouth every 8 (eight) hours as needed. 08/05/22   Campbell Lerner, MD  insulin aspart (NOVOLOG) 100 UNIT/ML injection Inject 12 Units into the skin 3 (three) times daily with meals. Patient taking differently: Inject 20 Units into the skin 3 (three) times daily with meals. 07/20/21   Osvaldo Shipper, MD  insulin glargine-yfgn (SEMGLEE) 100 UNIT/ML injection Inject 1.2 mLs (120 Units total) into the skin at bedtime. Patient taking differently: Inject 140 Units into the skin at bedtime. 02/27/22   Marrion Coy, MD  lacosamide (VIMPAT) 50 MG TABS tablet Take 1 tablet (50 mg total) by mouth 2 (two) times daily. 03/01/22   Clapacs, Jackquline Denmark, MD  levETIRAcetam (KEPPRA) 500 MG tablet Take 1 tablet (500 mg total) by mouth 2 (two) times daily. 03/01/22   Clapacs, Jackquline Denmark, MD  mirtazapine (REMERON) 30 MG tablet Take 1 tablet (30 mg total) by mouth at bedtime. 03/01/22   Clapacs, Jackquline Denmark, MD  ondansetron (ZOFRAN-ODT) 4 MG disintegrating tablet Take 1 tablet (4 mg total) by mouth every 8 (eight) hours as needed for nausea or vomiting. 06/30/22   Irean Hong, MD  oxyCODONE (OXY IR/ROXICODONE) 5 MG immediate release tablet Take 1 tablet (5 mg total) by mouth every 6 (six) hours as needed for severe pain. 08/05/22   Campbell Lerner, MD  oxyCODONE-acetaminophen (PERCOCET/ROXICET) 5-325 MG tablet Take 1 tablet by mouth every 4 (four) hours as needed for severe pain. 06/30/22   Irean Hong, MD  pantoprazole  (PROTONIX) 40 MG tablet Take 1 tablet (40 mg total) by mouth daily at 12 noon. 03/01/22   Clapacs, Jackquline Denmark, MD  prazosin (MINIPRESS) 1 MG capsule Take 1 capsule (1 mg total) by mouth at bedtime. 03/01/22   Clapacs, Jackquline Denmark, MD  pregabalin (LYRICA) 75 MG capsule Take 75 mg by mouth 3 (three) times daily. 02/03/22   [provider]  progesterone (PROMETRIUM) 200 MG capsule Take 200 mg by mouth at bedtime. 01/21/22   [provider]  vitamin B-12 (CYANOCOBALAMIN) 500 MCG tablet Take 1 tablet (500 mcg total) by mouth daily. 07/20/21   Osvaldo Shipper, MD    Physical Exam: Vitals:   08/07/22 1513 08/07/22 2041 08/07/22 2045 08/07/22 2204  BP:  133/65 133/65   Pulse:  85 76 77  Resp:  20  20  Temp:  97.8 F (36.6 C)    TempSrc:  Oral    SpO2:  98% 97% 98%  Weight: 95.3 kg     Height:  (1.575 m)      Physical Exam  Labs on Admission: I have personally reviewed following labs and imaging studies  CBC: Recent Labs  Lab 08/05/22 0105 08/07/22 1526  WBC 8.4 8.0  NEUTROABS 6.4 5.8  HGB 9.1* 8.7*  HCT 31.2* 30.5*  MCV 80.8 82.4  PLT 259 295   Basic Metabolic Panel: Recent Labs  Lab 08/05/22 0105 08/07/22 1526  NA 139 139  K 3.9 4.0  CL 107 109  CO2 27 25  GLUCOSE 208* 180*  BUN 13 24*  CREATININE 0.56 1.20*  CALCIUM 8.8* 8.8*   GFR: Estimated Creatinine Clearance: 54.3 mL/min (A) (by C-G formula based on SCr of 1.2 mg/dL (H)). Liver Function Tests: Recent Labs  Lab 08/05/22 0105 08/07/22 1526  AST 11* 19  ALT 12 10  ALKPHOS 92 80  BILITOT 0.5 0.8  PROT 7.4 6.8  ALBUMIN 3.4* 3.2*   Recent Labs  Lab 08/05/22 0105 08/07/22 1526  LIPASE 24 28   No results for input(s): "AMMONIA" in the last 168 hours. Coagulation Profile: No results for input(s): "INR", "PROTIME" in the last 168 hours. Cardiac Enzymes: No results for input(s): "CKTOTAL", "CKMB", "CKMBINDEX", "TROPONINI" in the last 168 hours. BNP (last 3 results) No results for input(s):  "PROBNP" in the last 8760 hours. HbA1C: No results for input(s): "HGBA1C" in the last 72 hours. CBG: Recent Labs  Lab 08/05/22 0842 08/05/22 1038  GLUCAP 120* 187*   Lipid Profile: No results for input(s): "CHOL", "HDL", "LDLCALC", "TRIG", "CHOLHDL", "LDLDIRECT" in the last 72 hours. Thyroid Function Tests: No results for input(s): "TSH", "T4TOTAL", "FREET4", "T3FREE", "THYROIDAB" in the last 72 hours. Anemia Panel: No results for input(s): "VITAMINB12", "FOLATE", "FERRITIN", "TIBC", "IRON", "RETICCTPCT" in the last 72 hours. Urine analysis:    Component Value Date/Time   COLORURINE YELLOW (A) 08/07/2022 1545   APPEARANCEUR HAZY (A) 08/07/2022 1545   APPEARANCEUR Cloudy 11/19/2014 2137   LABSPEC 1.016 08/07/2022 1545   LABSPEC 1.026 11/19/2014 2137   PHURINE 5.0 08/07/2022 1545   GLUCOSEU NEGATIVE 08/07/2022 1545   GLUCOSEU Negative 11/19/2014 2137   HGBUR SMALL (A) 08/07/2022 1545   BILIRUBINUR NEGATIVE 08/07/2022 1545   BILIRUBINUR Negative 11/19/2014 2137   KETONESUR NEGATIVE 08/07/2022 1545   PROTEINUR 30 (A) 08/07/2022 1545   NITRITE NEGATIVE 08/07/2022 1545   LEUKOCYTESUR NEGATIVE 08/07/2022 1545   LEUKOCYTESUR 3+ 11/19/2014 2137    Radiological Exams on Admission: MR Brain W and Wo Contrast  Result Date: 08/07/2022 CLINICAL DATA:  Initial evaluation for neuro deficit, stroke suspected. EXAM: MRI HEAD WITHOUT AND WITH CONTRAST TECHNIQUE: Multiplanar, multiecho pulse sequences of the brain and surrounding structures were obtained without and with intravenous contrast. CONTRAST:  7.8mL GADAVIST GADOBUTROL 1 MMOL/ML IV SOLN COMPARISON:  Prior CT from earlier the same day. FINDINGS: Brain: Cerebral volume within normal limits. Scattered patchy T2/FLAIR hyperintensity involving the periventricular deep white matter both cerebral hemispheres, consistent with chronic small vessel ischemic disease, mild in nature. Focus of FLAIR hyperintensity involving the high posterior right  posterior right parietal lobe, consistent with a chronic infarct, corresponding with hypodensity noted on prior CT. Additional small remote left occipital infarct noted as well. 1.3 cm acute ischemic infarct present within the right thalamus (series 16, image 24). No associated hemorrhage or mass effect. Additional small focus of mild diffusion signal abnormality involving the subcortical left parietal lobe likely reflects a small evolving subacute small vessel infarct (series 16, image 34). No other evidence for acute or subacute ischemia. Gray-white matter differentiation otherwise maintained. No acute or chronic intracranial blood products. No mass lesion, midline shift or mass effect. No hydrocephalus or extra-axial fluid collection. Pituitary gland and suprasellar region within normal limits.  Vascular: Major intracranial vascular flow voids are maintained. Skull and upper cervical spine: Cranial junction within normal limits. Diffuse loss of normal bone marrow signal, nonspecific but can be seen with anemia, smoking, obesity, and infiltrative/myelofibrotic marrow processes. No focal marrow replacing lesion. No scalp soft tissue abnormality. Sinuses/Orbits: Globes orbital soft tissues within normal limits. Mild scattered mucosal thickening noted about the paranasal sinuses. No mastoid effusion. Other: None. IMPRESSION: 1. 1.3 cm acute ischemic nonhemorrhagic right thalamic infarct. 2. Additional small focus of mild diffusion signal abnormality involving the subcortical left parietal lobe, likely a small evolving subacute small vessel infarct. 3. Chronic right parietal and left occipital infarcts. The right parietal infarct accounts for the hypodensity noted on prior CT. 4. Underlying mild chronic microvascular ischemic disease. Electronically Signed   By: Rise Mu M.D.   On: 08/07/2022 20:24   CT ABDOMEN PELVIS W CONTRAST  Result Date: 08/07/2022 CLINICAL DATA:  Abdominal pain, recent  cholecystectomy EXAM: CT ABDOMEN AND PELVIS WITH CONTRAST TECHNIQUE: Multidetector CT imaging of the abdomen and pelvis was performed using the standard protocol following bolus administration of intravenous contrast. RADIATION DOSE REDUCTION: This exam was performed according to the departmental dose-optimization program which includes automated exposure control, adjustment of the mA and/or kV according to patient size and/or use of iterative reconstruction technique. CONTRAST:  OMNIPAQUE IOHEXOL 300 MG/ML  SOLN COMPARISON:  Sonogram done on 08/05/2022, CT done on 02/23/2022 FINDINGS: Lower chest: Visualized lower lung fields are clear. Hepatobiliary: Liver measures 19.5 cm in length. There is no dilation of bile ducts. Gallbladder is not seen. Pancreas: There is atrophy.  No focal abnormalities are seen. Spleen: Spleen measures 12.8 cm in maximum diameter. Adrenals/Urinary Tract: Adrenals are unremarkable. There is no hydronephrosis. There is 6 mm calculus in the midportion of left kidney. There is 1 mm calculus in the lower pole of right kidney. There is 1.6 cm cyst in the lower pole of right kidney. Ureters are not dilated. Urinary bladder is not distended. Stomach/Bowel: Stomach is unremarkable. Small bowel loops are not dilated. Appendix is not dilated. There is no significant wall thickening in colon. Scattered diverticula are seen in colon without signs of focal diverticulitis. Vascular/Lymphatic: Scattered arterial calcifications are seen. Reproductive: There is 5 cm simple appearing cyst in the left adnexa which has not changed significantly since 02/23/2022. Other: There is no ascites or pneumoperitoneum. There is subcutaneous stranding in the periumbilical region, possibly related to recent laparoscopic cholecystectomy. Musculoskeletal: No acute findings are seen. There is encroachment of neural foramina by bony spurs at the L4-L5 and L5-S1 levels. IMPRESSION: There is no evidence of intestinal  obstruction or pneumoperitoneum. There is no hydronephrosis. Status post cholecystectomy. There is abnormal fluid collection in gallbladder fossa. There is no dilation of bile ducts. Bilateral nonobstructing renal stones. Right renal cysts. Diverticulosis of colon without signs of diverticulitis. There is 5 cm smooth marginated simple appearing cyst in the left adnexa. Follow-up sonogram in 6 months may be considered. Enlarged liver and spleen.  Lumbar spondylosis. Other findings as described in the body of the report. Electronically Signed   By: Ernie Avena M.D.   On: 08/07/2022 16:58   CT Head Wo Contrast  Result Date: 08/07/2022 CLINICAL DATA:  Trauma, fall, recent gallbladder surgery EXAM: CT HEAD WITHOUT CONTRAST TECHNIQUE: Contiguous axial images were obtained from the base of the skull through the vertex without intravenous contrast. RADIATION DOSE REDUCTION: This exam was performed according to the departmental dose-optimization program which includes automated exposure control,  adjustment of the mA and/or kV according to patient size and/or use of iterative reconstruction technique. COMPARISON:  06/30/2021 FINDINGS: Brain: There is new 1.6 cm area of decreased density in subcortical white matter in the posteromedial right parietal lobe. There are no signs of bleeding within the cortex. Ventricles are not dilated. Cortical sulci are prominent. There is decreased density in periventricular white matter. Vascular: Unremarkable. Skull: No fracture is seen. Sinuses/Orbits: There is mucosal thickening in ethmoid and maxillary sinuses. Other: None. IMPRESSION: There are no signs of bleeding within the cranium. There is new 1.6 cm area of decreased density in subcortical white matter in the posteromedial right parietal lobe. This may suggest recent or old infarct. Less likely possibility would be a space-occupying lesion with adjacent edema. Follow-up MRI may be considered. Chronic sinusitis.  Electronically Signed   By: Ernie Avena M.D.   On: 08/07/2022 16:43     Data Reviewed: Relevant notes from primary care and specialist visits, past discharge summaries as available in EHR, including Care Everywhere. Prior diagnostic testing as pertinent to current admission diagnoses Updated medications and problem lists for reconciliation ED course, including vitals, labs, imaging, treatment and response to treatment Triage notes, nursing and pharmacy notes and ED provider's notes Notable results as noted in HPI   Assessment and Plan: * Acute CVA (cerebrovascular accident) (HCC) History of prior strokes Patient presenting with weak grip strength and lower extremity weakness resulting in a fall, presenting 2 days post laparoscopic cholecystectomy MRI showing 1.3 cm acute ischemic right thalamic infarct, possible evolving subacute small vessel infarct and chronic right parietal and left occipital infarcts Patient unable to get CTA or MRA due to earlier contrasted abdominal CT Aspirin 325 and Plavix 75 given(of note patient was on Xarelto until 6 months ago) Permissive hypertension for first 24-48 hrs post stroke onset: Prn Labetalol IV or Vasotec IV If BP greater than 220/120  Statins for LDL goal less than 70 ASA 81mg  daily, Plavix 75mg  daily x 3 weeks then monotherapy thereafter Telemetry,  Avoid dextrose containing fluids, Maintain euglycemia, euthermia Neuro checks q4 hrs x 24 hrs and then per shift Head of bed 30 degrees Physical therapy/Occupational therapy/Speech therapy if failed dysphagia screen Neurology consult to follow     Postoperative abdominal pain S/p laparoscopic cholecystectomy 08/05/2022 CT abdomen with no evidence of intestinal obstruction or pneumoperitoneum Pain control Surgery consulted Will keep n.p.o. from midnight just in case  AKI (acute kidney injury) (HCC) Creatinine 1.2 up from 0.56 Suspect prerenal related to dehydration/volume loss and  perioperative period versus decreased oral intake IV hydration and monitor renal function and avoiding nephrotoxins.  History of DVT (deep vein thrombosis) Weaned off Xarelto 6 months prior  Diabetes mellitus without complication (HCC) Basal insulin continued with sliding scale coverage  Hypertension Holding home antihypertensives to allow for permissive hypertension  Anemia Hemoglobin 8.7 down from baseline 9-10 Continue to monitor  Depression with anxiety History of hospitalization for the same Continue Remeron  OSA (obstructive sleep apnea) CPAP nightly if desired  Seizure disorder (HCC) Continue Keppra, Vimpat and Lyrica  Chronic prescription opiate use Continue home meds        DVT prophylaxis: Lovenox  Consults: Neurologist, Dr. and surgeon, Dr. 08/07/2022  Advance Care Planning:   Code Status: Prior   Family Communication: none  Disposition Plan: Back to previous home environment  Severity of Illness: The appropriate patient status for this patient is INPATIENT. Inpatient status is judged to be reasonable and necessary in order  to provide the required intensity of service to ensure the patient's safety. The patient's presenting symptoms, physical exam findings, and initial radiographic and laboratory data in the context of their chronic comorbidities is felt to place them at high risk for further clinical deterioration. Furthermore, it is not anticipated that the patient will be medically stable for discharge from the hospital within 2 midnights of admission.   * I certify that at the point of admission it is my clinical judgment that the patient will require inpatient hospital care spanning beyond 2 midnights from the point of admission due to high intensity of service, high risk for further deterioration and high frequency of surveillance required.*  Author: Andris Baumann, MD 08/07/2022 10:39 PM  For on call review [email or web address].

## 2022-08-07 NOTE — Assessment & Plan Note (Signed)
History of prior strokes Patient presenting with weak grip strength and lower extremity weakness resulting in a fall, presenting 2 days post laparoscopic cholecystectomy MRI showing 1.3 cm acute ischemic right thalamic infarct, possible evolving subacute small vessel infarct and chronic right parietal and left occipital infarcts Patient unable to get CTA or MRA due to earlier contrasted abdominal CT Aspirin 325 and Plavix 75 given(of note patient was on Xarelto until 6 months ago) Permissive hypertension for first 24-48 hrs post stroke onset: Prn Labetalol IV or Vasotec IV If BP greater than 220/120  Statins for LDL goal less than 70 ASA 81mg  daily, Plavix 75mg  daily x 3 weeks then monotherapy thereafter Telemetry,  Avoid dextrose containing fluids, Maintain euglycemia, euthermia Neuro checks q4 hrs x 24 hrs and then per shift Head of bed 30 degrees Physical therapy/Occupational therapy/Speech therapy if failed dysphagia screen Neurology consult to follow

## 2022-08-07 NOTE — Assessment & Plan Note (Signed)
History of hospitalization for the same Continue Remeron

## 2022-08-07 NOTE — Hospital Course (Signed)
depression with anxiety, hypertension, insulin-dependent type 2 diabetes mellitus, anemia, vertigo, obesity with BMI 34.75, seizure disorder, history of DVT of Xarelto x 6 months, OSA, chronic pain with chronic opiate use, and prior strokes, who is s/p uneventful elective laparoscopic cholecystectomy on 12/22,Who presents to the ED with a 1 day history of blurred vision,  falls, dropping things as well as persistent postsurgical pain. ED course and data review: BP 107/80 with otherwise normal vitals.  Labs with normal WBC.  Hemoglobin 8.7 down from average of 9-10.  Creatinine 1.20 up from baseline of 0.56.  Urinalysis unremarkable.  EKG ED, personally viewed and interpreted showing NSR at 83 with no acute ST-T wave changes.  MRI brain showing acute stroke as further detailed below: IMPRESSION: 1. 1.3 cm acute ischemic nonhemorrhagic right thalamic infarct. 2. Additional small focus of mild diffusion signal abnormality involving the subcortical left parietal lobe, likely a small evolving subacute small vessel infarct. 3. Chronic right parietal and left occipital infarcts. The right parietal infarct accounts for the hypodensity noted on prior CT. 4. Underlying mild chronic microvascular ischemic disease.   CT abdomen and pelvis with no apparent concerning postoperative findings as detailed below: IMPRESSION: There is no evidence of intestinal obstruction or pneumoperitoneum. There is no hydronephrosis. Status post cholecystectomy. There is abnormal fluid collection in gallbladder fossa. There is no dilation of bile ducts. Bilateral nonobstructing renal stones. Right renal cysts. Diverticulosis of colon without signs of diverticulitis. There is 5 cm smooth marginated simple appearing cyst in the left adnexa. Follow-up sonogram in 6 months may be considered. Enlarged liver and spleen.  Lumbar spondylosis. Other findings as described in the body of the report.     Hospitalist consulted for  admission.

## 2022-08-07 NOTE — Assessment & Plan Note (Signed)
Weaned off Xarelto 6 months prior

## 2022-08-07 NOTE — ED Notes (Signed)
Pt noted to be ambulatory in lobby, asking staff for change for vending machine. Advised pt no change to provide,and it is recommended that pt not eat or drink prior to seeing physician. Pt continues to walk away from staff complaining loudly about wait times.

## 2022-08-07 NOTE — Assessment & Plan Note (Signed)
Continue Keppra, Vimpat and Lyrica

## 2022-08-07 NOTE — Progress Notes (Signed)
Anticoagulation monitoring(Lovenox):  61 yo female ordered Lovenox 40 mg Q24h    Filed Weights   08/07/22 1513  Weight: 95.3 kg (210 lb)   BMI 38.4    Lab Results  Component Value Date   CREATININE 1.20 (H) 08/07/2022   CREATININE 0.56 08/05/2022   CREATININE 0.51 06/30/2022   Estimated Creatinine Clearance: 54.3 mL/min (A) (by C-G formula based on SCr of 1.2 mg/dL (H)). Hemoglobin & Hematocrit     Component Value Date/Time   HGB 8.7 (L) 08/07/2022 1526   HGB 10.1 (L) 11/19/2014 2136   HCT 30.5 (L) 08/07/2022 1526   HCT 33.3 (L) 11/19/2014 2136     Per Protocol for Patient with estCrcl > 30 ml/min and BMI > 30, will transition to Lovenox 47.5 mg Q24h.

## 2022-08-07 NOTE — Assessment & Plan Note (Signed)
Basal insulin continued with sliding scale coverage

## 2022-08-07 NOTE — Assessment & Plan Note (Signed)
Hemoglobin 8.7 down from baseline 9-10 Continue to monitor

## 2022-08-07 NOTE — Assessment & Plan Note (Signed)
CPAP nightly if desired °

## 2022-08-07 NOTE — Assessment & Plan Note (Signed)
Holding home antihypertensives to allow for permissive hypertension

## 2022-08-07 NOTE — ED Provider Triage Note (Signed)
  Emergency Medicine Provider Triage Evaluation Note  Lauren Velazquez , a 61 y.o.female,  was evaluated in triage.  Pt complains of abdominal pain, dizziness, and urinary dysfunction.  She states she had her gallbladder out this past Friday.  Since then she has had persistent pain along the surgical site.  In addition, she has additionally been experiencing some blurred vision and has been falling frequently.  She states that she keeps dropping things sporadically throughout the day.  Her motor dysfunction started yesterday.  She does endorse a history of stroke.   Review of Systems  Positive: Abdominal pain, dizziness, blurred vision, motor dysfunction. Negative: Denies fever, chest pain, vomiting  Physical Exam   Vitals:   08/07/22 1512  BP: 107/80  Pulse: 87  Resp: 18  Temp: 98 F (36.7 C)  SpO2: 95%   Gen:   Awake, no distress   Resp:  Normal effort  MSK:   Moves extremities without difficulty  Other:  No facial droop, no gross neurological deficits, 5/5 strength and sensation in both upper and lower extremities.  Medical Decision Making  Given the patient's initial medical screening exam, the following diagnostic evaluation has been ordered. The patient will be placed in the appropriate treatment space, once one is available, to complete the evaluation and treatment. I have discussed the plan of care with the patient and I have advised the patient that an ED physician or mid-level practitioner will reevaluate their condition after the test results have been received, as the results may give them additional insight into the type of treatment they may need.    Diagnostics: Labs, abdominal CT, head CT, UA  Treatments: none immediately   Varney Daily, [place] 08/07/22 1524

## 2022-08-07 NOTE — Assessment & Plan Note (Signed)
-   Continue home meds °

## 2022-08-08 DIAGNOSIS — I639 Cerebral infarction, unspecified: Secondary | ICD-10-CM | POA: Diagnosis not present

## 2022-08-08 LAB — LIPID PANEL
Cholesterol: 151 mg/dL (ref 0–200)
HDL: 36 mg/dL — ABNORMAL LOW (ref >40)
LDL Cholesterol: 74 mg/dL (ref 0–99)
Total CHOL/HDL Ratio: 4.2 RATIO
Triglycerides: 203 mg/dL — ABNORMAL HIGH (ref <150)
VLDL: 41 mg/dL — ABNORMAL HIGH (ref 0–40)

## 2022-08-08 LAB — CBC
HCT: 30.3 % — ABNORMAL LOW (ref 36.0–46.0)
Hemoglobin: 8.6 g/dL — ABNORMAL LOW (ref 12.0–15.0)
MCH: 23.4 pg — ABNORMAL LOW (ref 26.0–34.0)
MCHC: 28.4 g/dL — ABNORMAL LOW (ref 30.0–36.0)
MCV: 82.6 fL (ref 80.0–100.0)
Platelets: 324 10*3/uL (ref 150–400)
RBC: 3.67 MIL/uL — ABNORMAL LOW (ref 3.87–5.11)
RDW: 15.9 % — ABNORMAL HIGH (ref 11.5–15.5)
WBC: 7.3 10*3/uL (ref 4.0–10.5)
nRBC: 0 % (ref 0.0–0.2)

## 2022-08-08 LAB — CBG MONITORING, ED
Glucose-Capillary: 275 mg/dL — ABNORMAL HIGH (ref 70–99)
Glucose-Capillary: 297 mg/dL — ABNORMAL HIGH (ref 70–99)

## 2022-08-08 LAB — CREATININE, SERUM
Creatinine, Ser: 0.74 mg/dL (ref 0.44–1.00)
GFR, Estimated: >60 mL/min (ref >60)

## 2022-08-08 LAB — HIV ANTIBODY (ROUTINE TESTING W REFLEX): HIV Screen 4th Generation wRfx: NONREACTIVE

## 2022-08-08 MED ORDER — INSULIN ASPART 100 UNIT/ML IJ SOLN
0.0000 [IU] | INTRAMUSCULAR | Status: DC
  Administered 2022-08-08: 11 [IU] via SUBCUTANEOUS
  Filled 2022-08-08: qty 1

## 2022-08-08 NOTE — ED Notes (Addendum)
RN to bedside. Pt asking "why am I NPO, I am a diabetic. I want to speak to the Dr." RN advised patient that this RN has already messaged the provider and he will be down when he does his rounds this morning. Pt states "I am not going to wait around all day, I am going to go home. Yall have done nothing for me that I can not do at home." RN advised patient there are specific orders to follow since patient had a stroke. Pt states "I am not here for a stroke I am here for concerns with my gallbladder surgery. RN advised patient that her admitting dx is a stroke. Pt continues to be disrespectful to staff. Husband at bedside also calmly trying to reason with patient. Pt states "I am a diabetic I have to eat." RN advised patient that her sugars have been high. Tech attempted to check patient blood sugar. Pt refused and states "I will not be doing any more health checks until I see the doctor. I will wait another hour for him and then I am leaving." Pt also refusing vitals.

## 2022-08-08 NOTE — ED Notes (Signed)
BS 275

## 2022-08-08 NOTE — ED Notes (Signed)
MD Kumar at bedside.  

## 2022-08-08 NOTE — ED Notes (Signed)
Report given to Alexys, RN  

## 2022-08-08 NOTE — ED Notes (Signed)
MD at bedside. 

## 2022-08-08 NOTE — ED Notes (Addendum)
Pt requesting to leave AMA. Pt continuing to disrespectfully speak to staff. Pt given and signed AMA paperwork with this RN and MD Lucianne Muss as a witness. RN advised patient to stop talking to and about staff disrespectfully as we are trying to care and explain her medical diagnoses and plan of care. Pt requesting wheelchair to be wheeled out. RN advised patient since she is leaving against medical advise we can not assist her in leaving. Pt states "It was my personal wheelchair I got it at the front and wanted it to stay with me." RN advised patient that if it is our wheelchair it can not just stay with her throughout stay. Pt requested copy of AMA paperwork and walked out with husband.

## 2022-08-08 NOTE — ED Notes (Signed)
Patient at bedside drinking non diet/non sugar free codas. Patient states she will drink and eat what she wants

## 2022-08-08 NOTE — Discharge Summary (Signed)
Physician Discharge Summary  Lauren Velazquez [id number] DOB: Jun 26, 1961 DOA: 08/07/2022  PCP: Denton Lank, MD  Admit date: 08/07/2022  Discharge date: 08/08/2022  Admitted From: Home Disposition:  Left against medical advice  Recommendations for Outpatient Follow-up:  Follow up with PCP in 1-2 weeks Please obtain BMP/CBC in one week Left [place] [facility]:None Equipment/Devices:None  Discharge Condition: Stable CODE STATUS:Full code Diet recommendation: Heart Healthy   Brief Summary / Hospital Course: This 61 year old female with PMH significant for depression, anxiety, hypertension, type 2 diabetes, anemia, vertigo, obesity with BMI of 34.7, seizure disorder, history of DVT on Xarelto for 6 months, OSA, chronic pain with chronic opioid use, prior stroke ,  status post an uneventful elective laparoscopic cholecystectomy on 08/05/22 presenting the ED with persistent postoperative pain as well as she has developed blurring of vision,  fall and has been dropping things.  Patient reports having these symptoms prior to leaving the hospital after her surgery and she thought it was related to surgery so she did not think much about it.  She continued to have the same symptoms following day and since started to improve however on the night prior to the arrival she had a fall and noticed that she was dropping things.  Patient was brought in the ED-CT head shows acute ischemic nonhemorrhagic right thalamic infarct.CT abdomen and pelvis concerning postoperative findings but no evidence of obstruction or pneumoperitoneum or hydronephrosis.  Patient was seen by general surgery.  Stroke workup was in progress. Renal functions are slightly improved.  Patient did not want to stay as it was a Christmas day.  She states she is feeling fine,  her symptoms are resolved and she wants to be discharged. She is following up with neurologist at [facility] and will follow-up after Christmas.   Patient left AGAINST MEDICAL ADVICE.  Discharge Diagnoses:  Principal Problem:   Acute CVA (cerebrovascular accident) [facility]) Active Problems:   History of CVA (cerebrovascular accident)   Postoperative abdominal pain   Status post laparoscopic cholecystectomy 08/05/2022   AKI (acute kidney injury) ([place])   History of DVT (deep vein thrombosis)   Diabetes mellitus without complication ([place])   Hypertension   Anemia   Diabetes mellitus type 2, uncomplicated (HCC)   Chronic prescription opiate use   Seizure disorder (HCC)   OSA (obstructive sleep apnea)   Depression with anxiety    Discharge Instructions Left against medical advice.  Allergies as of 08/08/2022       Reactions   Atorvastatin Swelling        Medication List     ASK your doctor about these medications    albuterol 108 (90 Base) MCG/ACT inhaler Commonly known as: VENTOLIN HFA Inhale 2 puffs into the lungs every 6 (six) hours as needed.   amLODipine 5 MG tablet Commonly known as: NORVASC Take 1 tablet (5 mg total) by mouth daily.   aspirin 81 MG chewable tablet Chew 1 tablet (81 mg total) by mouth daily.   cyanocobalamin 500 MCG tablet Commonly known as: VITAMIN B12 Take 1 tablet (500 mcg total) by mouth daily.   ezetimibe 10 MG tablet Commonly known as: ZETIA Take 1 tablet (10 mg total) by mouth daily.   ferrous sulfate 325 (65 FE) MG tablet Take 1 tablet (325 mg total) by mouth 2 (two) times daily with a meal.   ibuprofen 800 MG tablet Commonly known as: ADVIL Take 1 tablet (800 mg total) by mouth every 8 (eight) hours as needed.  insulin aspart 100 UNIT/ML injection Commonly known as: novoLOG Inject 12 Units into the skin 3 (three) times daily with meals.   insulin glargine-yfgn 100 UNIT/ML injection Commonly known as: SEMGLEE Inject 1.2 mLs (120 Units total) into the skin at bedtime.   lacosamide 50 MG Tabs tablet Commonly known as: VIMPAT Take 1 tablet (50 mg total) by mouth 2  (two) times daily.   levETIRAcetam 500 MG tablet Commonly known as: KEPPRA Take 1 tablet (500 mg total) by mouth 2 (two) times daily.   mirtazapine 30 MG tablet Commonly known as: REMERON Take 1 tablet (30 mg total) by mouth at bedtime.   ondansetron 4 MG disintegrating tablet Commonly known as: ZOFRAN-ODT Take 1 tablet (4 mg total) by mouth every 8 (eight) hours as needed for nausea or vomiting.   oxyCODONE 5 MG immediate release tablet Commonly known as: Oxy IR/ROXICODONE Take 1 tablet (5 mg total) by mouth every 6 (six) hours as needed for severe pain.   oxyCODONE-acetaminophen 5-325 MG tablet Commonly known as: PERCOCET/ROXICET Take 1 tablet by mouth every 4 (four) hours as needed for severe pain.   pantoprazole 40 MG tablet Commonly known as: PROTONIX Take 1 tablet (40 mg total) by mouth daily at 12 noon.   prazosin 1 MG capsule Commonly known as: MINIPRESS Take 1 capsule (1 mg total) by mouth at bedtime.   pregabalin 75 MG capsule Commonly known as: LYRICA Take 75 mg by mouth 3 (three) times daily.   progesterone 200 MG capsule Commonly known as: PROMETRIUM Take 200 mg by mouth at bedtime.        Allergies  Allergen Reactions   Atorvastatin Swelling    Consultations: None   Procedures/Studies: MR Brain W and Wo Contrast  Result Date: 08/07/2022 CLINICAL DATA:  Initial evaluation for neuro deficit, stroke suspected. EXAM: MRI HEAD WITHOUT AND WITH CONTRAST TECHNIQUE: Multiplanar, multiecho pulse sequences of the brain and surrounding structures were obtained without and with intravenous contrast. CONTRAST:  7.63mL GADAVIST GADOBUTROL 1 MMOL/ML IV SOLN COMPARISON:  Prior CT from earlier the same day. FINDINGS: Brain: Cerebral volume within normal limits. Scattered patchy T2/FLAIR hyperintensity involving the periventricular deep white matter both cerebral hemispheres, consistent with chronic small vessel ischemic disease, mild in nature. Focus of FLAIR  hyperintensity involving the high posterior right posterior right parietal lobe, consistent with a chronic infarct, corresponding with hypodensity noted on prior CT. Additional small remote left occipital infarct noted as well. 1.3 cm acute ischemic infarct present within the right thalamus (series 16, image 24). No associated hemorrhage or mass effect. Additional small focus of mild diffusion signal abnormality involving the subcortical left parietal lobe likely reflects a small evolving subacute small vessel infarct (series 16, image 34). No other evidence for acute or subacute ischemia. Gray-white matter differentiation otherwise maintained. No acute or chronic intracranial blood products. No mass lesion, midline shift or mass effect. No hydrocephalus or extra-axial fluid collection. Pituitary gland and suprasellar region within normal limits. Vascular: Major intracranial vascular flow voids are maintained. Skull and upper cervical spine: Cranial junction within normal limits. Diffuse loss of normal bone marrow signal, nonspecific but can be seen with anemia, smoking, obesity, and infiltrative/myelofibrotic marrow processes. No focal marrow replacing lesion. No scalp soft tissue abnormality. Sinuses/Orbits: Globes orbital soft tissues within normal limits. Mild scattered mucosal thickening noted about the paranasal sinuses. No mastoid effusion. Other: None. IMPRESSION: 1. 1.3 cm acute ischemic nonhemorrhagic right thalamic infarct. 2. Additional small focus of mild diffusion signal abnormality involving  the subcortical left parietal lobe, likely a small evolving subacute small vessel infarct. 3. Chronic right parietal and left occipital infarcts. The right parietal infarct accounts for the hypodensity noted on prior CT. 4. Underlying mild chronic microvascular ischemic disease. Electronically Signed   By: Jeannine Boga M.D.   On: 08/07/2022 20:24   CT ABDOMEN PELVIS W CONTRAST  Result Date:  08/07/2022 CLINICAL DATA:  Abdominal pain, recent cholecystectomy EXAM: CT ABDOMEN AND PELVIS WITH CONTRAST TECHNIQUE: Multidetector CT imaging of the abdomen and pelvis was performed using the standard protocol following bolus administration of intravenous contrast. RADIATION DOSE REDUCTION: This exam was performed according to the departmental dose-optimization program which includes automated exposure control, adjustment of the mA and/or kV according to patient size and/or use of iterative reconstruction technique. CONTRAST:  159mL OMNIPAQUE IOHEXOL 300 MG/ML  SOLN COMPARISON:  Sonogram done on 08/05/2022, CT done on 02/23/2022 FINDINGS: Lower chest: Visualized lower lung fields are clear. Hepatobiliary: Liver measures 19.5 cm in length. There is no dilation of bile ducts. Gallbladder is not seen. Pancreas: There is atrophy.  No focal abnormalities are seen. Spleen: Spleen measures 12.8 cm in maximum diameter. Adrenals/Urinary Tract: Adrenals are unremarkable. There is no hydronephrosis. There is 6 mm calculus in the midportion of left kidney. There is 1 mm calculus in the lower pole of right kidney. There is 1.6 cm cyst in the lower pole of right kidney. Ureters are not dilated. Urinary bladder is not distended. Stomach/Bowel: Stomach is unremarkable. Small bowel loops are not dilated. Appendix is not dilated. There is no significant wall thickening in colon. Scattered diverticula are seen in colon without signs of focal diverticulitis. Vascular/Lymphatic: Scattered arterial calcifications are seen. Reproductive: There is 5 cm simple appearing cyst in the left adnexa which has not changed significantly since 02/23/2022. Other: There is no ascites or pneumoperitoneum. There is subcutaneous stranding in the periumbilical region, possibly related to recent laparoscopic cholecystectomy. Musculoskeletal: No acute findings are seen. There is encroachment of neural foramina by bony spurs at the L4-L5 and L5-S1 levels.  IMPRESSION: There is no evidence of intestinal obstruction or pneumoperitoneum. There is no hydronephrosis. Status post cholecystectomy. There is abnormal fluid collection in gallbladder fossa. There is no dilation of bile ducts. Bilateral nonobstructing renal stones. Right renal cysts. Diverticulosis of colon without signs of diverticulitis. There is 5 cm smooth marginated simple appearing cyst in the left adnexa. Follow-up sonogram in 6 months may be considered. Enlarged liver and spleen.  Lumbar spondylosis. Other findings as described in the body of the report. Electronically Signed   By: Elmer Picker M.D.   On: 08/07/2022 16:58   CT Head Wo Contrast  Result Date: 08/07/2022 CLINICAL DATA:  Trauma, fall, recent gallbladder surgery EXAM: CT HEAD WITHOUT CONTRAST TECHNIQUE: Contiguous axial images were obtained from the base of the skull through the vertex without intravenous contrast. RADIATION DOSE REDUCTION: This exam was performed according to the departmental dose-optimization program which includes automated exposure control, adjustment of the mA and/or kV according to patient size and/or use of iterative reconstruction technique. COMPARISON:  06/30/2021 FINDINGS: Brain: There is new 1.6 cm area of decreased density in subcortical white matter in the posteromedial right parietal lobe. There are no signs of bleeding within the cortex. Ventricles are not dilated. Cortical sulci are prominent. There is decreased density in periventricular white matter. Vascular: Unremarkable. Skull: No fracture is seen. Sinuses/Orbits: There is mucosal thickening in ethmoid and maxillary sinuses. Other: None. IMPRESSION: There are no signs of  bleeding within the cranium. There is new 1.6 cm area of decreased density in subcortical white matter in the posteromedial right parietal lobe. This may suggest recent or old infarct. Less likely possibility would be a space-occupying lesion with adjacent edema. Follow-up MRI  may be considered. Chronic sinusitis. Electronically Signed   By: Elmer Picker M.D.   On: 08/07/2022 16:43   US ABDOMEN LIMITED RUQ (LIVER/GB)  Result Date: 08/05/2022 CLINICAL DATA:  ruq pain EXAM: ULTRASOUND ABDOMEN LIMITED RIGHT UPPER QUADRANT COMPARISON:  CT abdomen pelvis 06/20/2021 FINDINGS: Gallbladder: Calcified gallstones within the gallbladder lumen. No gallbladder wall thickening or pericholecystic fluid visualized. No sonographic Murphy sign noted by sonographer. Common bile duct: Diameter: 10 mm. Liver: No focal lesion identified. Increased parenchymal echogenicity. Portal vein is patent on color Doppler imaging with normal direction of blood flow towards the liver. Other: None. IMPRESSION: 1. Hepatic steatosis. Please note limited evaluation for focal hepatic masses in a patient with hepatic steatosis due to decreased penetration of the acoustic ultrasound waves. 2. Cholelithiasis with no acute cholecystitis. 3. Mildly dilated common bile duct. Visualized portions the common bowel duct demonstrates no choledocholithiasis. Consider MRCP if clinically indicated. Electronically Signed   By: Iven Finn M.D.   On: 08/05/2022 03:59   (Echo, Carotid, EGD, Colonoscopy, ERCP)   Subjective: Patient was seen and examined at bedside.  Overnight events noted.  Patient feels better and wants to be discharged, explained to her in detail,  she did not want to stay she left [place].  Discharge Exam: Vitals:   08/08/22 0530 08/08/22 0600  BP: (!) 146/84 (!) 152/79  Pulse:    Resp: 15 16  Temp:    SpO2:     Vitals:   08/08/22 0215 08/08/22 0503 08/08/22 0530 08/08/22 0600  BP:  (!) 167/75 (!) 146/84 (!) 152/79  Pulse:  95    Resp: (!) 21 19 15 16   Temp:  97.9 F (36.6 C)    TempSrc:  Oral    SpO2:  98%    Weight:      Height:        General: Pt is alert, awake, not in acute distress Cardiovascular: RRR, S1/S2 +, no rubs, no gallops Respiratory: CTA  bilaterally, no wheezing, no rhonchi Abdominal: Soft, NT, ND, bowel sounds + Extremities: no edema, no cyanosis    The results of significant diagnostics from this hospitalization (including imaging, microbiology, ancillary and laboratory) are listed below for reference.     Microbiology: Recent Results (from the past 240 hour(s))  Resp panel by RT-PCR (RSV, Flu A&B, Covid) Anterior Nasal Swab     Status: None   Collection Time: 08/05/22  3:44 AM   Specimen: Anterior Nasal Swab  Result Value Ref Range Status   SARS Coronavirus 2 by RT PCR NEGATIVE NEGATIVE Final    Comment: (NOTE) SARS-CoV-2 target nucleic acids are NOT DETECTED.  The SARS-CoV-2 RNA is generally detectable in upper respiratory specimens during the acute phase of infection. The lowest concentration of SARS-CoV-2 viral copies this assay can detect is 138 copies/mL. A negative result does not preclude SARS-Cov-2 infection and should not be used as the sole basis for treatment or other patient management decisions. A negative result may occur with  improper specimen collection/handling, submission of specimen other than nasopharyngeal swab, presence of viral mutation(s) within the areas targeted by this assay, and inadequate number of viral copies(<138 copies/mL). A negative result must be combined with clinical observations, patient history, and epidemiological  information. The expected result is Negative.  Fact Sheet for Patients:  BloggerCourse.com  Fact Sheet for Healthcare Providers:  SeriousBroker.it  This test is no t yet approved or cleared by the [place] FDA and  has been authorized for detection and/or diagnosis of SARS-CoV-2 by FDA under an Emergency Use Authorization (EUA). This EUA will remain  in effect (meaning this test can be used) for the duration of the COVID-19 declaration under Section 564(b)(1) of the Act, 21 U.S.C.section  360bbb-3(b)(1), unless the authorization is terminated  or revoked sooner.       Influenza A by PCR NEGATIVE NEGATIVE Final   Influenza B by PCR NEGATIVE NEGATIVE Final    Comment: (NOTE) The Xpert Xpress SARS-CoV-2/FLU/RSV plus assay is intended as an aid in the diagnosis of influenza from Nasopharyngeal swab specimens and should not be used as a sole basis for treatment. Nasal washings and aspirates are unacceptable for Xpert Xpress SARS-CoV-2/FLU/RSV testing.  Fact Sheet for Patients: BloggerCourse.com  Fact Sheet for Healthcare Providers: SeriousBroker.it  This test is not yet approved or cleared by the [place] FDA and has been authorized for detection and/or diagnosis of SARS-CoV-2 by FDA under an Emergency Use Authorization (EUA). This EUA will remain in effect (meaning this test can be used) for the duration of the COVID-19 declaration under Section 564(b)(1) of the Act, 21 U.S.C. section 360bbb-3(b)(1), unless the authorization is terminated or revoked.     Resp Syncytial Virus by PCR NEGATIVE NEGATIVE Final    Comment: (NOTE) Fact Sheet for Patients: BloggerCourse.com  Fact Sheet for Healthcare Providers: SeriousBroker.it  This test is not yet approved or cleared by the [place] FDA and has been authorized for detection and/or diagnosis of SARS-CoV-2 by FDA under an Emergency Use Authorization (EUA). This EUA will remain in effect (meaning this test can be used) for the duration of the COVID-19 declaration under Section 564(b)(1) of the Act, 21 U.S.C. section 360bbb-3(b)(1), unless the authorization is terminated or revoked.  Performed at [facility], [address] [place], [place] [address]      Labs: BNP (last 3 results) No results for input(s): "BNP" in the last 8760 hours. Basic Metabolic Panel: Recent Labs  Lab 08/05/22 0105  08/07/22 1526 08/08/22 0057  NA 139 139  --   K 3.9 4.0  --   CL 107 109  --   CO2 27 25  --   GLUCOSE 208* 180*  --   BUN 13 24*  --   CREATININE 0.56 1.20* 0.74  CALCIUM 8.8* 8.8*  --    Liver Function Tests: Recent Labs  Lab 08/05/22 0105 08/07/22 1526  AST 11* 19  ALT 12 10  ALKPHOS 92 80  BILITOT 0.5 0.8  PROT 7.4 6.8  ALBUMIN 3.4* 3.2*   Recent Labs  Lab 08/05/22 0105 08/07/22 1526  LIPASE 24 28   No results for input(s): "AMMONIA" in the last 168 hours. CBC: Recent Labs  Lab 08/05/22 0105 08/07/22 1526 08/08/22 0057  WBC 8.4 8.0 7.3  NEUTROABS 6.4 5.8  --   HGB 9.1* 8.7* 8.6*  HCT 31.2* 30.5* 30.3*  MCV 80.8 82.4 82.6  PLT 259 295 324   Cardiac Enzymes: No results for input(s): "CKTOTAL", "CKMB", "CKMBINDEX", "TROPONINI" in the last 168 hours. BNP: Invalid input(s): "POCBNP" CBG: Recent Labs  Lab 08/05/22 0842 08/05/22 1038 08/08/22 0118 08/08/22 0533  GLUCAP 120* 187* 275* 297*   D-Dimer No results for input(s): "DDIMER" in the last 72  hours. Hgb A1c No results for input(s): "HGBA1C" in the last 72 hours. Lipid Profile Recent Labs    08/08/22 0504  CHOL 151  HDL 36*  LDLCALC 74  TRIG 203*  CHOLHDL 4.2   Thyroid function studies No results for input(s): "TSH", "T4TOTAL", "T3FREE", "THYROIDAB" in the last 72 hours.  Invalid input(s): "FREET3" Anemia work up No results for input(s): "VITAMINB12", "FOLATE", "FERRITIN", "TIBC", "IRON", "RETICCTPCT" in the last 72 hours. Urinalysis    Component Value Date/Time   COLORURINE YELLOW (A) 08/07/2022 1545   APPEARANCEUR HAZY (A) 08/07/2022 1545   APPEARANCEUR Cloudy 11/19/2014 2137   LABSPEC 1.016 08/07/2022 1545   LABSPEC 1.026 11/19/2014 2137   PHURINE 5.0 08/07/2022 1545   GLUCOSEU NEGATIVE 08/07/2022 1545   GLUCOSEU Negative 11/19/2014 2137   HGBUR SMALL (A) 08/07/2022 1545   BILIRUBINUR NEGATIVE 08/07/2022 1545   BILIRUBINUR Negative 11/19/2014 2137   KETONESUR NEGATIVE  08/07/2022 1545   PROTEINUR 30 (A) 08/07/2022 1545   NITRITE NEGATIVE 08/07/2022 1545   LEUKOCYTESUR NEGATIVE 08/07/2022 1545   LEUKOCYTESUR 3+ 11/19/2014 2137   Sepsis Labs Recent Labs  Lab 08/05/22 0105 08/07/22 1526 08/08/22 0057  WBC 8.4 8.0 7.3   Microbiology Recent Results (from the past 240 hour(s))  Resp panel by RT-PCR (RSV, Flu A&B, Covid) Anterior Nasal Swab     Status: None   Collection Time: 08/05/22  3:44 AM   Specimen: Anterior Nasal Swab  Result Value Ref Range Status   SARS Coronavirus 2 by RT PCR NEGATIVE NEGATIVE Final    Comment: (NOTE) SARS-CoV-2 target nucleic acids are NOT DETECTED.  The SARS-CoV-2 RNA is generally detectable in upper respiratory specimens during the acute phase of infection. The lowest concentration of SARS-CoV-2 viral copies this assay can detect is 138 copies/mL. A negative result does not preclude SARS-Cov-2 infection and should not be used as the sole basis for treatment or other patient management decisions. A negative result may occur with  improper specimen collection/handling, submission of specimen other than nasopharyngeal swab, presence of viral mutation(s) within the areas targeted by this assay, and inadequate number of viral copies(<138 copies/mL). A negative result must be combined with clinical observations, patient history, and epidemiological information. The expected result is Negative.  Fact Sheet for Patients:  EntrepreneurPulse.com.au  Fact Sheet for Healthcare Providers:  IncredibleEmployment.be  This test is no t yet approved or cleared by the [place] FDA and  has been authorized for detection and/or diagnosis of SARS-CoV-2 by FDA under an Emergency Use Authorization (EUA). This EUA will remain  in effect (meaning this test can be used) for the duration of the COVID-19 declaration under Section 564(b)(1) of the Act, 21 U.S.C.section 360bbb-3(b)(1), unless the  authorization is terminated  or revoked sooner.       Influenza A by PCR NEGATIVE NEGATIVE Final   Influenza B by PCR NEGATIVE NEGATIVE Final    Comment: (NOTE) The Xpert Xpress SARS-CoV-2/FLU/RSV plus assay is intended as an aid in the diagnosis of influenza from Nasopharyngeal swab specimens and should not be used as a sole basis for treatment. Nasal washings and aspirates are unacceptable for Xpert Xpress SARS-CoV-2/FLU/RSV testing.  Fact Sheet for Patients: EntrepreneurPulse.com.au  Fact Sheet for Healthcare Providers: IncredibleEmployment.be  This test is not yet approved or cleared by the [place] FDA and has been authorized for detection and/or diagnosis of SARS-CoV-2 by FDA under an Emergency Use Authorization (EUA). This EUA will remain in effect (meaning this test can be used) for the  duration of the COVID-19 declaration under Section 564(b)(1) of the Act, 21 U.S.C. section 360bbb-3(b)(1), unless the authorization is terminated or revoked.     Resp Syncytial Virus by PCR NEGATIVE NEGATIVE Final    Comment: (NOTE) Fact Sheet for Patients: EntrepreneurPulse.com.au  Fact Sheet for Healthcare Providers: IncredibleEmployment.be  This test is not yet approved or cleared by the [place] FDA and has been authorized for detection and/or diagnosis of SARS-CoV-2 by FDA under an Emergency Use Authorization (EUA). This EUA will remain in effect (meaning this test can be used) for the duration of the COVID-19 declaration under Section 564(b)(1) of the Act, 21 U.S.C. section 360bbb-3(b)(1), unless the authorization is terminated or revoked.  Performed at [facility], [address], [place], [place] [address]      Time coordinating discharge: Over 30 minutes  SIGNED:   Shawna Clamp, MD  [facility] 08/08/2022, 3:46 PM Pager   If 7PM-7AM, please contact  night-coverage

## 2022-08-08 NOTE — ED Notes (Signed)
Patient refused her cbg check at 7:58am. RN was present to witness the refusal of the cbg check.

## 2022-08-08 NOTE — ED Notes (Signed)
BS 297

## 2022-08-08 NOTE — ED Notes (Signed)
RN messaged MD Lucianne Muss, pt requesting to speak with MD.

## 2022-08-08 NOTE — ED Notes (Signed)
Patient given sandwhich tray

## 2022-08-08 NOTE — ED Notes (Signed)
Patient states "I hate coming here. I like going to [facility]. Their kitchen stays open all the time and you can order off the menu like a hotel. Their rooms are nicer there too, it is like a hotel. I am just ready to go home. I do not like coming here. The only people that come here are Medicaid people."

## 2022-08-08 NOTE — ED Notes (Signed)
Patient states "you are only giving me 11 units of insulin." I explained to patient that this is the order and I am giving insulin based on sliding scale. Patient states "that is not going to do anything. This is while I manage it on my own. I do a better job. I do not have a scale I go on." Patient has grape sunkist and mountain dew at bedside. Patient drinks a sip of mountain dew and states "I might as well make my sugar 600, maybe they will do something then" Patient given 650mg  of Tylenol for pain in addition to oxycodone. Patient states "I am only getting 650, what a joke. I will be so ready to get out of here. Once the doctor comes by I am out of here"

## 2022-08-09 LAB — SURGICAL PATHOLOGY

## 2022-08-10 ENCOUNTER — Ambulatory Visit (INDEPENDENT_AMBULATORY_CARE_PROVIDER_SITE_OTHER): Payer: 59 | Admitting: Physician Assistant

## 2022-08-10 ENCOUNTER — Encounter: Payer: Self-pay | Admitting: Physician Assistant

## 2022-08-10 ENCOUNTER — Other Ambulatory Visit: Payer: Self-pay

## 2022-08-10 VITALS — BP 111/74 | HR 49 | Temp 98.1°F | Ht 62.0 in | Wt 210.0 lb

## 2022-08-10 DIAGNOSIS — K8010 Calculus of gallbladder with chronic cholecystitis without obstruction: Secondary | ICD-10-CM

## 2022-08-10 DIAGNOSIS — Z09 Encounter for follow-up examination after completed treatment for conditions other than malignant neoplasm: Secondary | ICD-10-CM

## 2022-08-10 MED ORDER — OXYCODONE HCL 5 MG PO TABS: 5.0000 mg | ORAL_TABLET | Freq: Four times a day (QID) | ORAL | 0 refills | Status: AC | PRN

## 2022-08-10 NOTE — Progress Notes (Signed)
[facility] POST-OP OFFICE VISIT  08/10/2022  HPI: Lauren Velazquez is a 61 y.o. female 5 days s/p robotic assisted laparoscopic cholecystectomy for [facility] with Dr Claudine Mouton   She did present to the ED on 12/24 secondary to persistent RUQ abdominal pain, with changes in vision, weakness, and a reported fall. Labs at that time showed a normal WBC at 8.0K, Hgb stable at 8.7, she did have an AKI with sCr - 1.20, bilirubin was normal at 0.8, LFTs and lipase normal as well. She had extensive work up including CT and MRI of the brian. 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 She would also have CT Abdomen/Pelvis at that time which showed showed expected changes s/p cholecystectomy, I do not appreciate any marked fluid collection despite reading, no free air. She was admitted for acute CVA and discharged 12/25 after leaving AMA  Today, she presents for continued abdominal pain. This seems to be primarily located on her right lateral and umbilical incisions. She is using oxycodone and tylenol but has run out of oxycodone. She does follow with a pain clinic and is due for refill on Friday. No fever, chills, nausea, emesis, or bowel changes. She is trying to limit fatty foods. No other complaints today. Again reviewed recent ED visit and work up with her.   Vital signs: BP 111/74   Pulse (!) 49   Temp 98.1 F (36.7 C) (Oral)   Ht 5\' 2"  (1.575 m)   Wt 210 lb (95.3 kg)   LMP 08/15/2008   SpO2 98%   BMI 38.41 kg/m    Physical Exam: Constitutional: Well appearing female, NAD Abdomen: Soft, incisional soreness at right lateral and umbilical port site, non-distended, no rebound/guarding Skin: Laparoscopic incisions are healing well, no erythema or drainage, there is ecchymosis across all incisions sites at various points in healing process.   Assessment/Plan: This is a 61 y.o. female 5 days s/p robotic  assisted laparoscopic cholecystectomy for [facility] with Dr [facility]    -I will give her 5 more oxycodone to get her to her scheduled refill of pain medications through her pain clinic. She understands our office will not fill any additional narcotic prescriptions after this. She was encouraged to continue to seek OTC alternatives as well.   - Reviewed wound care recommendation  - Reviewed lifting restrictions; 4 weeks total  - Reviewed surgical pathology; CCC  - I think it is reasonable to proceed with as needed follow up given her extensive and reassuring work up in the ED early this well; She is agreeable. She understands to call with questions/concerns at any time.   -- Claudine Mouton, PA-C [facility] 08/10/2022, 3:03 PM M-F: 7am - 4pm

## 2022-08-10 NOTE — Patient Instructions (Signed)

## 2022-08-13 ENCOUNTER — Other Ambulatory Visit: Payer: Self-pay

## 2022-08-13 DIAGNOSIS — Z5321 Procedure and treatment not carried out due to patient leaving prior to being seen by health care provider: Secondary | ICD-10-CM | POA: Insufficient documentation

## 2022-08-13 DIAGNOSIS — I10 Essential (primary) hypertension: Secondary | ICD-10-CM | POA: Insufficient documentation

## 2022-08-13 DIAGNOSIS — R42 Dizziness and giddiness: Secondary | ICD-10-CM | POA: Diagnosis present

## 2022-08-13 LAB — CBC
HCT: 32.5 % — ABNORMAL LOW (ref 36.0–46.0)
Hemoglobin: 8.9 g/dL — ABNORMAL LOW (ref 12.0–15.0)
MCH: 23.6 pg — ABNORMAL LOW (ref 26.0–34.0)
MCHC: 27.4 g/dL — ABNORMAL LOW (ref 30.0–36.0)
MCV: 86.2 fL (ref 80.0–100.0)
Platelets: 321 10*3/uL (ref 150–400)
RBC: 3.77 MIL/uL — ABNORMAL LOW (ref 3.87–5.11)
RDW: 16.2 % — ABNORMAL HIGH (ref 11.5–15.5)
WBC: 8.3 10*3/uL (ref 4.0–10.5)
nRBC: 0.4 % — ABNORMAL HIGH (ref 0.0–0.2)

## 2022-08-13 LAB — BASIC METABOLIC PANEL
Anion gap: 8 (ref 5–15)
BUN: 8 mg/dL (ref 6–20)
CO2: 24 mmol/L (ref 22–32)
Calcium: 8.8 mg/dL — ABNORMAL LOW (ref 8.9–10.3)
Chloride: 112 mmol/L — ABNORMAL HIGH (ref 98–111)
Creatinine, Ser: 0.65 mg/dL (ref 0.44–1.00)
GFR, Estimated: >60 mL/min (ref >60)
Glucose, Bld: 100 mg/dL — ABNORMAL HIGH (ref 70–99)
Potassium: 4.6 mmol/L (ref 3.5–5.1)
Sodium: 144 mmol/L (ref 135–145)

## 2022-08-13 LAB — TROPONIN I (HIGH SENSITIVITY): Troponin I (High Sensitivity): 4 ng/L (ref <18)

## 2022-08-13 NOTE — ED Triage Notes (Signed)
Pt to Ed from home for dizziness that started at around 5pm. Laying down makes it better per the patient. Pt is CAOx4 and in no acute distress.

## 2022-08-13 NOTE — ED Notes (Signed)
Pt advised MD here said she had a stroke about a week ago. Pt just started taking lisinipril yesterday and this is a new medication. She is also now taking her meds all at one time and that's new as well. Pt has no new complaints today other than the dizziness. She has had this before and was diagnosed with vertigo and this feels similar. But with her new HTN diagnosis and the new meds she got worried and came in.

## 2022-08-13 NOTE — ED Triage Notes (Signed)
FIRST NURSE NOTE:  pt arrived via POV with c/o elevated BP, states she took her evening BP meds, pt c/o dizziness and states she is having shortness of breath, pt states she wears oxygen at night when she is sleeping.

## 2022-08-14 ENCOUNTER — Emergency Department
Admission: EM | Admit: 2022-08-14 | Discharge: 2022-08-14 | Discharge status: Against Medical Advice | Payer: 59 | Attending: Emergency Medicine | Admitting: Emergency Medicine

## 2022-08-14 NOTE — ED Notes (Signed)
Pt reports she is feeling better and wants to go home

## 2022-08-20 ENCOUNTER — Other Ambulatory Visit: Payer: Self-pay

## 2022-08-20 ENCOUNTER — Emergency Department: Payer: 59

## 2022-08-20 ENCOUNTER — Encounter: Payer: Self-pay | Admitting: Emergency Medicine

## 2022-08-20 ENCOUNTER — Emergency Department
Admission: EM | Admit: 2022-08-20 | Discharge: 2022-08-20 | Disposition: A | Discharge status: Home / Self Care | Payer: 59 | Attending: Emergency Medicine | Admitting: Emergency Medicine

## 2022-08-20 DIAGNOSIS — K5730 Diverticulosis of large intestine without perforation or abscess without bleeding: Secondary | ICD-10-CM | POA: Insufficient documentation

## 2022-08-20 DIAGNOSIS — E119 Type 2 diabetes mellitus without complications: Secondary | ICD-10-CM | POA: Diagnosis not present

## 2022-08-20 DIAGNOSIS — Z9049 Acquired absence of other specified parts of digestive tract: Secondary | ICD-10-CM | POA: Insufficient documentation

## 2022-08-20 DIAGNOSIS — G40909 Epilepsy, unspecified, not intractable, without status epilepticus: Secondary | ICD-10-CM | POA: Insufficient documentation

## 2022-08-20 DIAGNOSIS — Z8673 Personal history of transient ischemic attack (TIA), and cerebral infarction without residual deficits: Secondary | ICD-10-CM | POA: Insufficient documentation

## 2022-08-20 DIAGNOSIS — N2889 Other specified disorders of kidney and ureter: Secondary | ICD-10-CM | POA: Insufficient documentation

## 2022-08-20 DIAGNOSIS — N3000 Acute cystitis without hematuria: Secondary | ICD-10-CM | POA: Diagnosis not present

## 2022-08-20 DIAGNOSIS — N281 Cyst of kidney, acquired: Secondary | ICD-10-CM | POA: Insufficient documentation

## 2022-08-20 DIAGNOSIS — I10 Essential (primary) hypertension: Secondary | ICD-10-CM | POA: Diagnosis not present

## 2022-08-20 DIAGNOSIS — R1031 Right lower quadrant pain: Secondary | ICD-10-CM | POA: Diagnosis present

## 2022-08-20 DIAGNOSIS — N200 Calculus of kidney: Secondary | ICD-10-CM | POA: Insufficient documentation

## 2022-08-20 LAB — COMPREHENSIVE METABOLIC PANEL
ALT: 9 U/L (ref 0–44)
AST: 14 U/L — ABNORMAL LOW (ref 15–41)
Albumin: 3.6 g/dL (ref 3.5–5.0)
Alkaline Phosphatase: 93 U/L (ref 38–126)
Anion gap: 10 (ref 5–15)
BUN: 8 mg/dL (ref 6–20)
CO2: 28 mmol/L (ref 22–32)
Calcium: 9.3 mg/dL (ref 8.9–10.3)
Chloride: 101 mmol/L (ref 98–111)
Creatinine, Ser: 0.71 mg/dL (ref 0.44–1.00)
GFR, Estimated: >60 mL/min (ref >60)
Glucose, Bld: 190 mg/dL — ABNORMAL HIGH (ref 70–99)
Potassium: 4.2 mmol/L (ref 3.5–5.1)
Sodium: 139 mmol/L (ref 135–145)
Total Bilirubin: 0.7 mg/dL (ref 0.3–1.2)
Total Protein: 7.7 g/dL (ref 6.5–8.1)

## 2022-08-20 LAB — CBC
HCT: 30.3 % — ABNORMAL LOW (ref 36.0–46.0)
Hemoglobin: 8.5 g/dL — ABNORMAL LOW (ref 12.0–15.0)
MCH: 22.7 pg — ABNORMAL LOW (ref 26.0–34.0)
MCHC: 28.1 g/dL — ABNORMAL LOW (ref 30.0–36.0)
MCV: 81 fL (ref 80.0–100.0)
Platelets: 297 10*3/uL (ref 150–400)
RBC: 3.74 MIL/uL — ABNORMAL LOW (ref 3.87–5.11)
RDW: 15.6 % — ABNORMAL HIGH (ref 11.5–15.5)
WBC: 6.9 10*3/uL (ref 4.0–10.5)
nRBC: 0 % (ref 0.0–0.2)

## 2022-08-20 LAB — URINALYSIS, ROUTINE W REFLEX MICROSCOPIC
Bilirubin Urine: NEGATIVE
Glucose, UA: NEGATIVE mg/dL
Ketones, ur: NEGATIVE mg/dL
Nitrite: NEGATIVE
Protein, ur: 30 mg/dL — AB
Specific Gravity, Urine: 1.014 (ref 1.005–1.030)
WBC, UA: >50 WBC/hpf — ABNORMAL HIGH (ref 0–5)
pH: 6 (ref 5.0–8.0)

## 2022-08-20 LAB — LIPASE, BLOOD: Lipase: 25 U/L (ref 11–51)

## 2022-08-20 LAB — TROPONIN I (HIGH SENSITIVITY): Troponin I (High Sensitivity): 3 ng/L (ref <18)

## 2022-08-20 MED ORDER — MORPHINE SULFATE (PF) 4 MG/ML IV SOLN
4.0000 mg | Freq: Once | INTRAVENOUS | Status: AC
  Administered 2022-08-20: 4 mg via INTRAVENOUS
  Filled 2022-08-20: qty 1

## 2022-08-20 MED ORDER — CEPHALEXIN 500 MG PO CAPS: 500.0000 mg | ORAL_CAPSULE | Freq: Three times a day (TID) | ORAL | 0 refills | Status: DC

## 2022-08-20 MED ORDER — METOCLOPRAMIDE HCL 5 MG/ML IJ SOLN
10.0000 mg | Freq: Once | INTRAMUSCULAR | Status: AC
  Administered 2022-08-20: 10 mg via INTRAVENOUS
  Filled 2022-08-20: qty 2

## 2022-08-20 MED ORDER — IOHEXOL 300 MG/ML  SOLN
100.0000 mL | Freq: Once | INTRAMUSCULAR | Status: AC | PRN
  Administered 2022-08-20: 100 mL via INTRAVENOUS

## 2022-08-20 MED ORDER — ONDANSETRON 4 MG PO TBDP: 4.0000 mg | ORAL_TABLET | Freq: Three times a day (TID) | ORAL | 0 refills | Status: DC | PRN

## 2022-08-20 MED ORDER — SODIUM CHLORIDE 0.9 % IV SOLN
1.0000 g | Freq: Once | INTRAVENOUS | Status: AC
  Administered 2022-08-20: 1 g via INTRAVENOUS
  Filled 2022-08-20: qty 10

## 2022-08-20 MED ORDER — ONDANSETRON HCL 4 MG/2ML IJ SOLN
4.0000 mg | Freq: Once | INTRAMUSCULAR | Status: AC
  Administered 2022-08-20: 4 mg via INTRAVENOUS
  Filled 2022-08-20: qty 2

## 2022-08-20 NOTE — ED Notes (Signed)
E signature pad not working. Pt educated on discharge instructions and verbalized understanding.  

## 2022-08-20 NOTE — ED Triage Notes (Signed)
Pt to ER with c/o urinary burning, nausea, and "not feeling right" since having gallbladder removed several weeks ago.  Pt states all symptoms have continued since surgery and pt continues to list several things that she has concerns over.

## 2022-08-20 NOTE — ED Notes (Signed)
Pt to STAT desk c/o chest pain and SOB.  Pt on 4l of O2.  1st nurse aware.  EKG and troponin done.

## 2022-08-20 NOTE — ED Notes (Signed)
Pt seen walking around the lobby with family member without O2. NAD

## 2022-08-20 NOTE — Discharge Instructions (Addendum)
Your exam and labs are reassuring at this time. There are no signs of a serious infection, colitis, kidney infection.  You are being treated for UTI.  Take antibiotics as directed and alternate as needed.  Follow with your primary provider for ongoing urinary symptoms.  Follow-up with your neurologist as scheduled on January 11.

## 2022-08-20 NOTE — ED Provider Notes (Signed)
[facility] Emergency Department Provider Note     Event Date/Time   First MD Initiated Contact with Patient 08/20/22 2029     (approximate)   History   multiple complaints   HPI  Lauren Velazquez is a 62 y.o. female history of diabetes, hyperlipidemia, HTN, obesity, seizure disorder, CVA, with recently diagnosed occlusive, nonhemorrhagic thalamic infarct, resents to the ED with reports of urinary frequency and dysuria described as burning.  Patient localizes her abdominal discomfort to the right lower quadrant.  Patient also reports nausea and generalized feeling of malaise.  Patient endorses urinary symptoms over the last week.  She notes approximately 2 weeks status post robot-assisted lap cholecystectomy by Dr. Marylou Flesher.   Physical Exam   Triage Vital Signs: ED Triage Vitals  Enc Vitals Group     BP 08/20/22 1747 (!) 165/90     Pulse Rate 08/20/22 1747 62     Resp 08/20/22 1747 18     Temp 08/20/22 1747 98.3 F (36.8 C)     Temp Source 08/20/22 1747 Oral     SpO2 08/20/22 1747 99 %     Weight 08/20/22 1748 209 lb 7 oz (95 kg)     Height 08/20/22 1748 5\' 2"  (1.575 m)     Head Circumference --      Peak Flow --      Pain Score 08/20/22 1748 3     Pain Loc --      Pain Edu? --      Excl. in [place]? --     Most recent vital signs: Vitals:   08/20/22 1747  BP: (!) 165/90  Pulse: 62  Resp: 18  Temp: 98.3 F (36.8 C)  SpO2: 99%    General Awake, no distress. NAD CV:  Good peripheral perfusion.  RESP:  Normal effort.  ABD:  No distention.  Protuberant, soft, and nontender.  Normoactive bowel sounds appreciated.   ED Results / Procedures / Treatments   Labs (all labs ordered are listed, but only abnormal results are displayed) Labs Reviewed  COMPREHENSIVE METABOLIC PANEL - Abnormal; Notable for the following components:      Result Value   Glucose, Bld 190 (*)    AST 14 (*)    All other components within normal limits  CBC -  Abnormal; Notable for the following components:   RBC 3.74 (*)    Hemoglobin 8.5 (*)    HCT 30.3 (*)    MCH 22.7 (*)    MCHC 28.1 (*)    RDW 15.6 (*)    All other components within normal limits  URINALYSIS, ROUTINE W REFLEX MICROSCOPIC - Abnormal; Notable for the following components:   Color, Urine YELLOW (*)    APPearance HAZY (*)    Hgb urine dipstick SMALL (*)    Protein, ur 30 (*)    Leukocytes,Ua LARGE (*)    WBC, UA >50 (*)    Bacteria, UA MANY (*)    All other components within normal limits  URINE CULTURE  LIPASE, BLOOD  TROPONIN I (HIGH SENSITIVITY)   EKG  Vent. rate 63 BPM PR interval 178 ms QRS duration 80 ms QT/QTcB 426/435 ms P-R-T axes 51 27 66 Normal axes No STEMI  RADIOLOGY  I personally viewed and evaluated these images as part of my medical decision making, as well as reviewing the written report by the radiologist.  ED Provider Interpretation: no acute findings  CT ABDOMEN PELVIS W CONTRAST  Result Date:  08/20/2022 CLINICAL DATA:  Right lower quadrant pain EXAM: CT ABDOMEN AND PELVIS WITH CONTRAST TECHNIQUE: Multidetector CT imaging of the abdomen and pelvis was performed using the standard protocol following bolus administration of intravenous contrast. RADIATION DOSE REDUCTION: This exam was performed according to the departmental dose-optimization program which includes automated exposure control, adjustment of the mA and/or kV according to patient size and/or use of iterative reconstruction technique. CONTRAST:  19mL OMNIPAQUE IOHEXOL 300 MG/ML  SOLN COMPARISON:  CT abdomen and pelvis 08/07/2022 FINDINGS: Lower chest: No acute abnormality. Hepatobiliary: No focal liver abnormality is seen. Status post cholecystectomy. No biliary dilatation. Pancreas: Unremarkable. No pancreatic ductal dilatation or surrounding inflammatory changes. Spleen: Normal in size without focal abnormality. Adrenals/Urinary Tract: Cortical cysts in the right kidney measuring 18  mm is unchanged. There is no hydronephrosis or perinephric fat stranding. There are punctate nonobstructing left renal calculi. The adrenal glands are within normal limits. Bladder is within normal limits. Stomach/Bowel: Stomach is within normal limits. Appendix appears normal. There is questionable wall thickening of the hepatic flexure of the colon versus normal under distension. There is no surrounding inflammation or pneumatosis. No dilated bowel loops are seen. There is colonic diverticulosis without evidence for acute diverticulitis. Vascular/Lymphatic: No significant vascular findings are present. No enlarged abdominal or pelvic lymph nodes. Reproductive: There is a 4.8 cm rounded left adnexal cystic structure which is unchanged. Right adnexa and uterus are within normal limits. Other: There is some new skin thickening and subcutaneous inflammation of the anterior abdominal wall inferior to the umbilicus. No focal fluid collection. Musculoskeletal: No acute or significant osseous findings. IMPRESSION: 1. Questionable wall thickening of the hepatic flexure of the colon versus normal under distension. Correlate clinically for colitis. 2. New skin thickening and subcutaneous inflammation of the anterior abdominal wall inferior to the umbilicus. Correlate clinically for cellulitis. No focal fluid collection. 3. Nonobstructing left renal calculi. 4. Colonic diverticulosis. 5. Stable 4.8 cm left adnexal cyst.  Consider follow-up ultrasound. 6. Right Bosniak I benign renal cyst measuring 1.8 cm. No follow-up imaging is recommended. JACR 2018 Feb; 264-273, Management of the Incidental Renal Mass on CT, [employer] 2021; 814-848, Bosniak Classification of Cystic Renal Masses, Version 2019. Electronically Signed   By: Ronney Asters M.D.   On: 08/20/2022 22:45   DG Chest 1 View  Result Date: 08/20/2022 CLINICAL DATA:  Chest pain and shortness of breath. EXAM: CHEST  1 VIEW COMPARISON:  None Available. FINDINGS: The  heart size and mediastinal contours are within normal limits. Both lungs are clear. The visualized skeletal structures are unremarkable. IMPRESSION: No active disease. Electronically Signed   By: Ronney Asters M.D.   On: 08/20/2022 19:47     PROCEDURES:  Critical Care performed: No  Procedures   MEDICATIONS ORDERED IN ED: Medications  metoCLOPramide (REGLAN) injection 10 mg (10 mg Intravenous Given 08/20/22 2141)  cefTRIAXone (ROCEPHIN) 1 g in sodium chloride 0.9 % 100 mL IVPB (1 g Intravenous New Bag/Given 08/20/22 2141)  iohexol (OMNIPAQUE) 300 MG/ML solution 100 mL (100 mLs Intravenous Contrast Given 08/20/22 2147)  morphine (PF) 4 MG/ML injection 4 mg (4 mg Intravenous Given 08/20/22 2221)  ondansetron (ZOFRAN) injection 4 mg (4 mg Intravenous Given 08/20/22 2248)     IMPRESSION / MDM / ASSESSMENT AND PLAN / ED COURSE  I reviewed the triage vital signs and the nursing notes.  Differential diagnosis includes, but is not limited to, acute appendicitis, diverticulitis, urinary tract infection/pyelonephritis, endometriosis, bowel obstruction, colitis, renal colic, gastroenteritis, hernia, etc.   Patient's presentation is most consistent with acute presentation with potential threat to life or bodily function.  The patient is on the cardiac monitor to evaluate for evidence of arrhythmia and/or significant heart rate changes.  Patient's diagnosis is consistent with uncomplicated UTI.  Stable exam and workup at this time.  No acute leukocytosis, critical anemia, electrolyte abnormality.  UA does confirm bacteriuria, and urine culture is pending to further direct management.  Patient is stable at this time for outpatient management of her uncomplicated UTI.  No indication at this time for admission as patient is otherwise stable and at the base, with scheduled follow-up for her previous neurological events.  Patient will be discharged home with prescriptions for Zofran  and Keflex. Patient is to follow up with primary provider as well as neurology as discussed as needed or otherwise directed. Patient is given ED precautions to return to the ED for any worsening or new symptoms.     FINAL CLINICAL IMPRESSION(S) / ED DIAGNOSES   Final diagnoses:  Acute cystitis without hematuria     Rx / DC Orders   ED Discharge Orders          Ordered    cephALEXin (KEFLEX) 500 MG capsule  3 times daily        08/20/22 2257    ondansetron (ZOFRAN-ODT) 4 MG disintegrating tablet  Every 8 hours PRN        08/20/22 2257             Note:  This document was prepared using Dragon voice recognition software and may include unintentional dictation errors.    Melvenia Needles, PA-C 08/20/22 2331    Duffy Bruce, MD 08/21/22 Dyann Kief

## 2022-08-20 NOTE — ED Notes (Signed)
Pt to the desk to inform this RN that she is having CP, EKG and trop obtained.

## 2022-08-23 ENCOUNTER — Telehealth: Payer: Self-pay | Admitting: Surgery

## 2022-08-23 ENCOUNTER — Other Ambulatory Visit: Payer: Self-pay | Admitting: Physician Assistant

## 2022-08-23 ENCOUNTER — Encounter: Payer: Self-pay | Admitting: Physician Assistant

## 2022-08-23 LAB — URINE CULTURE: Culture: >=100000 — AB

## 2022-08-23 MED ORDER — SULFAMETHOXAZOLE-TRIMETHOPRIM 800-160 MG PO TABS: 1.0000 | ORAL_TABLET | Freq: Two times a day (BID) | ORAL | 0 refills | Status: AC

## 2022-08-23 NOTE — Progress Notes (Signed)
Patient known to [place] s/p cholecystectomy with Dr Christian Mate on 12/22 Done well Recently seen in the ED on 01/06 and Dx with pan-sensitive E coli UTI Started on Keflex Called today with continued burning pan with urination despite Abx; tried her PCP but they are closed Also asking for more pain medications  I will be happy to help with UTI; switch to Bactrim I will not refill pain medications; she does follow with pain clinic and has a pain contract  -- Edison Simon, PA-C [facility] 08/23/2022, 12:52 PM M-F: 7am - 4pm

## 2022-08-23 NOTE — Telephone Encounter (Signed)
Spoke with the patient and let her know that Edison Simon, PA-C has sent he in a prescription for Bactrim to her pharmacy. He said that he cannot send her in anymore pain medication as she just got a prescription for 84 tablets of Percocet. The patient is aware and will start this medication.

## 2022-08-23 NOTE — Telephone Encounter (Signed)
Patient was in the ED on 08/05/22 and ended up having robotic cholecystectomy done by Dr. Christian Mate on 08/05/22.  Patient states that since she has been having trouble with UTI's. She tried calling her primary care doctor and she states that they are closed today.  Patient said that the surgery gave her a UTI and wants to know what to do. Please call her.  Thank you.

## 2022-08-27 ENCOUNTER — Emergency Department
Admission: EM | Admit: 2022-08-27 | Discharge: 2022-08-27 | Disposition: A | Discharge status: Home / Self Care | Payer: 59 | Attending: Emergency Medicine | Admitting: Emergency Medicine

## 2022-08-27 ENCOUNTER — Emergency Department: Payer: 59

## 2022-08-27 ENCOUNTER — Other Ambulatory Visit: Payer: Self-pay

## 2022-08-27 DIAGNOSIS — E119 Type 2 diabetes mellitus without complications: Secondary | ICD-10-CM | POA: Insufficient documentation

## 2022-08-27 DIAGNOSIS — Z7901 Long term (current) use of anticoagulants: Secondary | ICD-10-CM | POA: Insufficient documentation

## 2022-08-27 DIAGNOSIS — I10 Essential (primary) hypertension: Secondary | ICD-10-CM | POA: Diagnosis not present

## 2022-08-27 DIAGNOSIS — Z86718 Personal history of other venous thrombosis and embolism: Secondary | ICD-10-CM | POA: Insufficient documentation

## 2022-08-27 DIAGNOSIS — R1011 Right upper quadrant pain: Secondary | ICD-10-CM | POA: Diagnosis not present

## 2022-08-27 DIAGNOSIS — Z9049 Acquired absence of other specified parts of digestive tract: Secondary | ICD-10-CM | POA: Insufficient documentation

## 2022-08-27 DIAGNOSIS — G4733 Obstructive sleep apnea (adult) (pediatric): Secondary | ICD-10-CM | POA: Insufficient documentation

## 2022-08-27 DIAGNOSIS — R110 Nausea: Secondary | ICD-10-CM | POA: Diagnosis present

## 2022-08-27 DIAGNOSIS — K561 Intussusception: Secondary | ICD-10-CM | POA: Insufficient documentation

## 2022-08-27 DIAGNOSIS — I700 Atherosclerosis of aorta: Secondary | ICD-10-CM | POA: Insufficient documentation

## 2022-08-27 DIAGNOSIS — G8929 Other chronic pain: Secondary | ICD-10-CM | POA: Insufficient documentation

## 2022-08-27 DIAGNOSIS — N200 Calculus of kidney: Secondary | ICD-10-CM | POA: Insufficient documentation

## 2022-08-27 LAB — URINALYSIS, ROUTINE W REFLEX MICROSCOPIC
Bilirubin Urine: NEGATIVE
Glucose, UA: NEGATIVE mg/dL
Hgb urine dipstick: NEGATIVE
Ketones, ur: 20 mg/dL — AB
Nitrite: NEGATIVE
Protein, ur: 30 mg/dL — AB
Specific Gravity, Urine: 1.019 (ref 1.005–1.030)
pH: 5 (ref 5.0–8.0)

## 2022-08-27 LAB — COMPREHENSIVE METABOLIC PANEL
ALT: 10 U/L (ref 0–44)
AST: 11 U/L — ABNORMAL LOW (ref 15–41)
Albumin: 3.7 g/dL (ref 3.5–5.0)
Alkaline Phosphatase: 98 U/L (ref 38–126)
Anion gap: 9 (ref 5–15)
BUN: 9 mg/dL (ref 6–20)
CO2: 23 mmol/L (ref 22–32)
Calcium: 9 mg/dL (ref 8.9–10.3)
Chloride: 105 mmol/L (ref 98–111)
Creatinine, Ser: 0.78 mg/dL (ref 0.44–1.00)
GFR, Estimated: >60 mL/min (ref >60)
Glucose, Bld: 197 mg/dL — ABNORMAL HIGH (ref 70–99)
Potassium: 4.1 mmol/L (ref 3.5–5.1)
Sodium: 137 mmol/L (ref 135–145)
Total Bilirubin: 0.4 mg/dL (ref 0.3–1.2)
Total Protein: 7.9 g/dL (ref 6.5–8.1)

## 2022-08-27 LAB — CBC
HCT: 33 % — ABNORMAL LOW (ref 36.0–46.0)
Hemoglobin: 9.3 g/dL — ABNORMAL LOW (ref 12.0–15.0)
MCH: 22.2 pg — ABNORMAL LOW (ref 26.0–34.0)
MCHC: 28.2 g/dL — ABNORMAL LOW (ref 30.0–36.0)
MCV: 78.8 fL — ABNORMAL LOW (ref 80.0–100.0)
Platelets: 332 10*3/uL (ref 150–400)
RBC: 4.19 MIL/uL (ref 3.87–5.11)
RDW: 15.4 % (ref 11.5–15.5)
WBC: 7.6 10*3/uL (ref 4.0–10.5)
nRBC: 0 % (ref 0.0–0.2)

## 2022-08-27 LAB — TROPONIN I (HIGH SENSITIVITY): Troponin I (High Sensitivity): 3 ng/L (ref <18)

## 2022-08-27 LAB — LIPASE, BLOOD: Lipase: 26 U/L (ref 11–51)

## 2022-08-27 MED ORDER — METOCLOPRAMIDE HCL 5 MG/ML IJ SOLN
10.0000 mg | Freq: Once | INTRAMUSCULAR | Status: AC
  Administered 2022-08-27: 10 mg via INTRAVENOUS
  Filled 2022-08-27: qty 2

## 2022-08-27 MED ORDER — OXYCODONE-ACETAMINOPHEN 5-325 MG PO TABS
2.0000 | ORAL_TABLET | Freq: Once | ORAL | Status: AC
  Administered 2022-08-27: 2 via ORAL
  Filled 2022-08-27: qty 2

## 2022-08-27 MED ORDER — METOCLOPRAMIDE HCL 10 MG PO TABS: 10.0000 mg | ORAL_TABLET | Freq: Three times a day (TID) | ORAL | 0 refills | Status: DC

## 2022-08-27 MED ORDER — PANTOPRAZOLE SODIUM 40 MG IV SOLR
40.0000 mg | Freq: Once | INTRAVENOUS | Status: AC
  Administered 2022-08-27: 40 mg via INTRAVENOUS
  Filled 2022-08-27: qty 10

## 2022-08-27 MED ORDER — SODIUM CHLORIDE 0.9 % IV SOLN
12.5000 mg | Freq: Once | INTRAVENOUS | Status: AC
  Administered 2022-08-27: 12.5 mg via INTRAVENOUS
  Filled 2022-08-27: qty 0.5

## 2022-08-27 MED ORDER — PROMETHAZINE HCL 12.5 MG PO TABS: 12.5000 mg | ORAL_TABLET | Freq: Four times a day (QID) | ORAL | 0 refills | Status: AC | PRN

## 2022-08-27 MED ORDER — SODIUM CHLORIDE 0.9 % IV SOLN
12.5000 mg | Freq: Four times a day (QID) | INTRAVENOUS | Status: DC | PRN
  Filled 2022-08-27: qty 0.5

## 2022-08-27 MED ORDER — SODIUM CHLORIDE 0.9 % IV BOLUS
500.0000 mL | Freq: Once | INTRAVENOUS | Status: AC
  Administered 2022-08-27: 500 mL via INTRAVENOUS

## 2022-08-27 MED ORDER — IOHEXOL 300 MG/ML  SOLN
100.0000 mL | Freq: Once | INTRAMUSCULAR | Status: AC | PRN
  Administered 2022-08-27: 100 mL via INTRAVENOUS

## 2022-08-27 NOTE — ED Provider Notes (Addendum)
[facility] Provider Note    Event Date/Time   First MD Initiated Contact with Patient 08/27/22 [phone]     (approximate)   History   Nausea   HPI  Lauren Velazquez is a 61 y.o. female with a history of hypertension, type 2 diabetes, anemia, seizure disorder, DVT on Xarelto, OSA, chronic pain, depression, and anxiety who presents with nausea for the last several weeks since she had a cholecystectomy.  The patient was previously having some right upper abdominal pain.  She states that this is still present but has not worsened in the last week.  She has no lower abdominal pain.  She denies any vomiting or fever.  She has no diarrhea or change in her bowel movements.  The patient states she was having dysuria and is being treated for UTI but these symptoms are improving.  I reviewed the past medical records.  The patient had an elective laparoscopy cholecystectomy on 12/22 and subsequent was admitted to the hospitalist service from 12/24 to 12/25 after presenting with blurred vision and weakness.  Stroke workup was initiated but the patient left AMA.  She was seen in the ED again on 1/6 with urinary symptoms and right lower quadrant pain.  Urinalysis was consistent with UTI and CT showed possible mild colitis.  She subsequently saw general surgery on 1/9 and was continued to complain of urinary symptoms so was switched to Bactrim.   Physical Exam   Triage Vital Signs: ED Triage Vitals  Enc Vitals Group     BP 08/27/22 0927 (!) 133/105     Pulse Rate 08/27/22 0927 69     Resp 08/27/22 0927 18     Temp 08/27/22 0927 97.8 F (36.6 C)     Temp Source 08/27/22 0927 Oral     SpO2 08/27/22 0927 95 %     Weight --      Height 08/27/22 0928 5\' 2"  (1.575 m)     Head Circumference --      Peak Flow --      Pain Score 08/27/22 0928 0     Pain Loc --      Pain Edu? --      Excl. in [place]? --     Most recent vital signs: Vitals:   08/27/22 0927 08/27/22 0931  BP: (!)  133/105 (!) 167/82  Pulse: 69   Resp: 18   Temp: 97.8 F (36.6 C)   SpO2: 95%      General: Alert and oriented, relatively well-appearing. CV:  Good peripheral perfusion.  Resp:  Normal effort.  Abd:  Soft with no focal tenderness.  No distention.  No erythema, induration, or cutaneous findings.  Incisions clean, dry, intact. Other:  No peripheral edema.  No jaundice or scleral icterus.   ED Results / Procedures / Treatments   Labs (all labs ordered are listed, but only abnormal results are displayed) Labs Reviewed  COMPREHENSIVE METABOLIC PANEL - Abnormal; Notable for the following components:      Result Value   Glucose, Bld 197 (*)    AST 11 (*)    All other components within normal limits  CBC - Abnormal; Notable for the following components:   Hemoglobin 9.3 (*)    HCT 33.0 (*)    MCV 78.8 (*)    MCH 22.2 (*)    MCHC 28.2 (*)    All other components within normal limits  URINALYSIS, ROUTINE W REFLEX MICROSCOPIC - Abnormal; Notable for the  following components:   Color, Urine YELLOW (*)    APPearance HAZY (*)    Ketones, ur 20 (*)    Protein, ur 30 (*)    Leukocytes,Ua TRACE (*)    Bacteria, UA MANY (*)    All other components within normal limits  URINE CULTURE  LIPASE, BLOOD     EKG     RADIOLOGY    PROCEDURES:  Critical Care performed: No  Procedures   MEDICATIONS ORDERED IN ED: Medications  promethazine (PHENERGAN) 12.5 mg in sodium chloride 0.9 % 50 mL IVPB (has no administration in time range)  promethazine (PHENERGAN) 12.5 mg in sodium chloride 0.9 % 50 mL IVPB (0 mg Intravenous Stopped 08/27/22 1210)  sodium chloride 0.9 % bolus 500 mL (500 mLs Intravenous New Bag/Given 08/27/22 1040)  oxyCODONE-acetaminophen (PERCOCET/ROXICET) 5-325 MG per tablet 2 tablet (2 tablets Oral Given 08/27/22 1336)     IMPRESSION / MDM / ASSESSMENT AND PLAN / ED COURSE  I reviewed the triage vital signs and the nursing notes.  62 year old female with PMH as  noted above presents primarily with nausea that has persisted over the last few weeks since a cholecystectomy.  The patient has been having some right upper quadrant postoperative pain but this has not worsened.  She was started on antibiotics for UTI last week and states that since she has been on Bactrim from 1/9 the urinary symptoms are improving.  The nausea is the main symptom that persists.  She received Phenergan in the ED which worked well.  She was given a prescription for Zofran which she states is not working.  She went to the walk-in clinic today and was sent to the ED for further evaluation.  Differential diagnosis includes, but is not limited to, gastritis, GERD, gastroparesis, postoperative nausea.  There is no evidence of bowel obstruction given the lack of any acute abdominal pain, tenderness, or distention.  CT on 1/6 showed possible mild colitis as well as possible abdominal wall cellulitis.  Currently the patient has no diarrhea or lower abdominal pain.  She also has no cutaneous findings to suggest abdominal wall cellulitis.  We will obtain lab workup, give fluids and Phenergan for the nausea, and reassess.  There is no indication for repeat imaging at this time based on the patient's clinical presentation and exam.  Patient's presentation is most consistent with acute complicated illness / injury requiring diagnostic workup.  ----------------------------------------- 3:20 PM on 08/27/2022 -----------------------------------------  Labs today show no leukocytosis or other concerning acute findings.  Lipase and LFTs are normal.  Urinalysis is pending.  The patient reported some mild recurrent right-sided pain that is similar to what she has had intermittently since her surgery, although her abdomen remains nontender, and there is still no evidence of acute intraabdominal process or indication for repeat imaging.  We have given a second dose of Phenergan and will reassess.  I  anticipate discharge home.  I signed her out to the oncoming ED physician Dr. Jari Pigg.   FINAL CLINICAL IMPRESSION(S) / ED DIAGNOSES   Final diagnoses:  Nausea     Rx / DC Orders   ED Discharge Orders          Ordered    promethazine (PHENERGAN) 12.5 MG tablet  Every 6 hours PRN        08/27/22 1454             Note:  This document was prepared using Dragon voice recognition software and may include unintentional dictation  errors.    Dionne Bucy, MD 08/27/22 1521    Dionne Bucy, MD 08/27/22 1529

## 2022-08-27 NOTE — ED Triage Notes (Signed)
Pt states that she had her gallbladder removed 2 weeks ago and since then has had nausea- pt tried to go to walk in clinic but they sent her here stating she had to have a CT scan- pt was also seen here a week ago for similar symptoms

## 2022-08-27 NOTE — ED Provider Notes (Signed)
3:51 PM Assumed care for off going team.   Blood pressure (!) 167/82, pulse 69, temperature 97.8 F (36.6 C), temperature source Oral, resp. rate 18, height 5\' 2"  (1.575 m), last menstrual period 08/15/2008, SpO2 95 %.  See their HPI for full report but in brief pending UA  Her UA is improving from 7 days ago and that there is 11-20 squamous cells and similar WBCs.  Her UA was sensitive to Bactrim and had grown E. coli.  Reevaluated patient still complain of pain.  Patient reports that she was told to come over here for MRI.  Explained to patient that we do not typically do MRIs of the abdomen unless for specific reasons.  Reviewed the note worse talked about a CT scan.  We discussed that this pain is similar that CTs probably will not show anything but after pros and cons discussion she would like to proceed with CT scan.  Patient still having an episode of vomiting post the Phenergan.  Will get EKG to check QTc.  EKG my interpretation is sinus rate of 69 without any ST elevation or T wave inversions, QTc of 456  EKG was personally reviewed and interpreted and does not show any evidence of any issues with the post op area RUQ  1. Short segment small bowel intussusception in the right lower quadrant without small bowel dilatation or definite wall thickening. This finding is in close proximity to a small 1.4 cm intraluminal small bowel lipoma. Findings are most likely transient and of doubtful clinical significance. 2. No evidence of fluid collection in the cholecystectomy bed. Bile ducts are within normal post cholecystectomy limits. 3. Nonobstructing left nephrolithiasis. 4. Stable 4.9 cm simple left adnexal cyst. No follow-up imaging recommended. 5. Aortic Atherosclerosis (ICD10-I70.0).   Reevaluated patient and she states that she would like to be discharged at this time.  She reports resolution of nausea and vomiting tolerating p.o. drinking.  Reevaluated her she had no left-sided  abdominal discomfort.  We discussed ultrasound and given no signs of torsion on CT and no left lower quadrant pain to suggest torsion this is a stable cyst no indication and patient would like to hold off on ultrasound.  We discussed the intussusception but I discussed this with Dr. Christian Mate who stated that this is transient and given no thickening or signs of SBO does not feel like it is causing the nausea, vomiting.  I discussed this with patient and she expressed understanding.  She reports the Reglan seem to help a lot.  She is diabetic I wonder if she could have some gastroparesis.  We discussed not using the Reglan with the Phenergan at the same time but trying 1 or the other and she expressed understanding and felt comfortable with discharge home    Vanessa Portage, MD 08/27/22 1827

## 2022-08-27 NOTE — Discharge Instructions (Addendum)
Take the Reglan to try to help with your nausea and vomiting.  If that is not working wait a few hours and take the Phenergan.  Return to the ER if develop worsening symptoms or any other concerns.  If you develop pain in your lower abdomen you should return to the ER for repeat evaluation, fevers or any other concerns.  You should follow-up with a GI doctor such as Dr. Haig Prophet to discuss your continued nausea, vomiting.  IMPRESSION: 1. Short segment small bowel intussusception in the right lower quadrant without small bowel dilatation or definite wall thickening. This finding is in close proximity to a small 1.4 cm intraluminal small bowel lipoma. Findings are most likely transient and of doubtful clinical significance. 2. No evidence of fluid collection in the cholecystectomy bed. Bile ducts are within normal post cholecystectomy limits. 3. Nonobstructing left nephrolithiasis. 4. Stable 4.9 cm simple left adnexal cyst. No follow-up imaging recommended. 5.  Aortic Atherosclerosis (ICD10-I70.0).

## 2022-08-28 LAB — URINE CULTURE: Culture: <10000 — AB

## 2022-09-12 ENCOUNTER — Other Ambulatory Visit: Payer: Self-pay | Admitting: Neurology

## 2022-09-12 ENCOUNTER — Emergency Department: Payer: 59

## 2022-09-12 ENCOUNTER — Emergency Department
Admission: EM | Admit: 2022-09-12 | Discharge: 2022-09-12 | Disposition: A | Discharge status: Home / Self Care | Payer: 59 | Attending: Emergency Medicine | Admitting: Emergency Medicine

## 2022-09-12 DIAGNOSIS — R42 Dizziness and giddiness: Secondary | ICD-10-CM | POA: Diagnosis present

## 2022-09-12 DIAGNOSIS — Z8673 Personal history of transient ischemic attack (TIA), and cerebral infarction without residual deficits: Secondary | ICD-10-CM

## 2022-09-12 DIAGNOSIS — G459 Transient cerebral ischemic attack, unspecified: Secondary | ICD-10-CM | POA: Diagnosis not present

## 2022-09-12 DIAGNOSIS — I10 Essential (primary) hypertension: Secondary | ICD-10-CM | POA: Insufficient documentation

## 2022-09-12 DIAGNOSIS — Z1152 Encounter for screening for COVID-19: Secondary | ICD-10-CM | POA: Diagnosis not present

## 2022-09-12 DIAGNOSIS — K3184 Gastroparesis: Secondary | ICD-10-CM

## 2022-09-12 LAB — DIFFERENTIAL
Abs Immature Granulocytes: 0.03 10*3/uL (ref 0.00–0.07)
Basophils Absolute: 0.1 10*3/uL (ref 0.0–0.1)
Basophils Relative: 1 %
Eosinophils Absolute: 0.3 10*3/uL (ref 0.0–0.5)
Eosinophils Relative: 3 %
Immature Granulocytes: 0 %
Lymphocytes Relative: 19 %
Lymphs Abs: 1.6 10*3/uL (ref 0.7–4.0)
Monocytes Absolute: 0.5 10*3/uL (ref 0.1–1.0)
Monocytes Relative: 6 %
Neutro Abs: 5.9 10*3/uL (ref 1.7–7.7)
Neutrophils Relative %: 71 %

## 2022-09-12 LAB — COMPREHENSIVE METABOLIC PANEL
ALT: 19 U/L (ref 0–44)
AST: 18 U/L (ref 15–41)
Albumin: 3.6 g/dL (ref 3.5–5.0)
Alkaline Phosphatase: 123 U/L (ref 38–126)
Anion gap: 9 (ref 5–15)
BUN: 9 mg/dL (ref 6–20)
CO2: 26 mmol/L (ref 22–32)
Calcium: 9.1 mg/dL (ref 8.9–10.3)
Chloride: 102 mmol/L (ref 98–111)
Creatinine, Ser: 0.58 mg/dL (ref 0.44–1.00)
GFR, Estimated: >60 mL/min (ref >60)
Glucose, Bld: 250 mg/dL — ABNORMAL HIGH (ref 70–99)
Potassium: 3 mmol/L — ABNORMAL LOW (ref 3.5–5.1)
Sodium: 137 mmol/L (ref 135–145)
Total Bilirubin: 0.5 mg/dL (ref 0.3–1.2)
Total Protein: 7.1 g/dL (ref 6.5–8.1)

## 2022-09-12 LAB — PROTIME-INR
INR: 1 (ref 0.8–1.2)
Prothrombin Time: 13.1 seconds (ref 11.4–15.2)

## 2022-09-12 LAB — ETHANOL: Alcohol, Ethyl (B): <10 mg/dL (ref <10)

## 2022-09-12 LAB — CBC
HCT: 35.4 % — ABNORMAL LOW (ref 36.0–46.0)
Hemoglobin: 9.9 g/dL — ABNORMAL LOW (ref 12.0–15.0)
MCH: 21.7 pg — ABNORMAL LOW (ref 26.0–34.0)
MCHC: 28 g/dL — ABNORMAL LOW (ref 30.0–36.0)
MCV: 77.6 fL — ABNORMAL LOW (ref 80.0–100.0)
Platelets: 400 10*3/uL (ref 150–400)
RBC: 4.56 MIL/uL (ref 3.87–5.11)
RDW: 16 % — ABNORMAL HIGH (ref 11.5–15.5)
WBC: 8.5 10*3/uL (ref 4.0–10.5)
nRBC: 0 % (ref 0.0–0.2)

## 2022-09-12 LAB — RESP PANEL BY RT-PCR (RSV, FLU A&B, COVID)  RVPGX2
Influenza A by PCR: NEGATIVE
Influenza B by PCR: NEGATIVE
Resp Syncytial Virus by PCR: NEGATIVE
SARS Coronavirus 2 by RT PCR: NEGATIVE

## 2022-09-12 LAB — TROPONIN I (HIGH SENSITIVITY): Troponin I (High Sensitivity): 5 ng/L (ref <18)

## 2022-09-12 LAB — LIPASE, BLOOD: Lipase: 27 U/L (ref 11–51)

## 2022-09-12 LAB — APTT: aPTT: 31 seconds (ref 24–36)

## 2022-09-12 MED ORDER — METOCLOPRAMIDE HCL 5 MG/ML IJ SOLN
10.0000 mg | Freq: Once | INTRAMUSCULAR | Status: AC
  Administered 2022-09-12: 10 mg via INTRAVENOUS
  Filled 2022-09-12: qty 2

## 2022-09-12 MED ORDER — SODIUM CHLORIDE 0.9 % IV BOLUS
1000.0000 mL | Freq: Once | INTRAVENOUS | Status: AC
  Administered 2022-09-12: 1000 mL via INTRAVENOUS

## 2022-09-12 MED ORDER — POTASSIUM CHLORIDE CRYS ER 20 MEQ PO TBCR
40.0000 meq | EXTENDED_RELEASE_TABLET | Freq: Once | ORAL | Status: AC
  Administered 2022-09-12: 40 meq via ORAL
  Filled 2022-09-12: qty 2

## 2022-09-12 MED ORDER — IOHEXOL 350 MG/ML SOLN
100.0000 mL | Freq: Once | INTRAVENOUS | Status: AC | PRN
  Administered 2022-09-12: 100 mL via INTRAVENOUS

## 2022-09-12 MED ORDER — LORAZEPAM 2 MG/ML IJ SOLN
0.5000 mg | Freq: Once | INTRAMUSCULAR | Status: AC
  Administered 2022-09-12: 0.5 mg via INTRAVENOUS
  Filled 2022-09-12: qty 1

## 2022-09-12 MED ORDER — METOCLOPRAMIDE HCL 10 MG PO TABS: 10.0000 mg | ORAL_TABLET | Freq: Three times a day (TID) | ORAL | 0 refills | Status: AC | PRN

## 2022-09-12 MED ORDER — OXYCODONE HCL 5 MG PO TABS
5.0000 mg | ORAL_TABLET | Freq: Once | ORAL | Status: AC
  Administered 2022-09-12: 5 mg via ORAL
  Filled 2022-09-12: qty 1

## 2022-09-12 NOTE — ED Provider Notes (Signed)
-----------------------------------------   5:39 PM on 09/12/2022 -----------------------------------------   I took over care on this patient with Dr. Jari Pigg.  Per Dr. Morey Hummingbird, the patient was pending CT angio of the head, CT angio of the chest, and CT of the abdomen pelvis.  If these were negative, the plan was discharge home and per Dr. Jari Pigg this was discussed extensively with the patient.  This is these do not show any acute findings.  CT abdomen did show a right lower quadrant area of intussusception which was seen on prior imaging from a few weeks ago.  There is no evidence of SBO.  I consulted Dr. Peyton Najjar from general surgery and discussed the case including the CT findings with him.  He advised that with no evidence of SBO there is no indication for acute intervention or treatment.  He recommends outpatient follow-up.  CT angio head:  IMPRESSION:  1. No acute intracranial abnormality.  2. No large vessel occlusion or hemodynamically significant  stenosis.   CT angio chest, abdomen/pelvis:  IMPRESSION:  1. No pulmonary embolism or acute abnormality identified in the  chest.  2. Small bowel intussusception in the right lower quadrant. There is  no evidence of small-bowel obstruction.  3. 4.8 cm simple cyst in the left ovary. Recommend follow-up US in  6-12 months. Note: This recommendation does not apply to  premenarchal patients and to those with increased risk (genetic,  family history, elevated tumor markers or other high-risk factors)  of ovarian cancer. Reference: JACR 2020 Feb; 17(2):248-254  4. Fatty infiltration of liver.  5. Aortic atherosclerosis.   I confirmed with Dr. Erlinda Hong from neurology that the patient should continue her current aspirin dose.  On reassessment, the patient reported continued symptoms of nausea, belching, and dizziness.  She appeared somewhat anxious and upset.  I had an extensive discussion with her about the results of the workup and plan of care.  I  reassured her that she had received an extremely thorough workup in the ED and that there were no signs of any emergency conditions that required hospital admission.  She has been consulted on by both neurology and the hospitalist and neither recommend admission.  The patient will need to follow-up with primary care, neurology, and likely GI as well for what we suspect is gastroparesis.  I have re-prescribed Reglan.  I gave the patient very strict return precautions and she expressed understanding.      Arta Silence, MD 09/12/22 2303

## 2022-09-12 NOTE — Discharge Instructions (Addendum)
Take the Reglan as needed.  Follow-up with your primary care doctor.  You should also follow-up with a gastroenterologist (stomach/GI doctor).  We have provided a referral.  You should also follow back up with the general surgeon (Dr. Christian Mate) due to the intussusception or area of overlapping intestines seen on your CAT scans.  Return to the ER for new, worsening, or persistent severe abdominal pain, vomiting, weakness or lightheadedness, fever, chest pain, worsening difficulty breathing, severe headache, weakness or numbness, strokelike symptoms, or any other new or worsening symptoms that concern you.

## 2022-09-12 NOTE — Consult Note (Signed)
Stroke Neurology Consultation Note  Consult Requested by: Dr. Jari Pigg  Reason for Consult: blurry vision and dizziness  Consult Date: 09/12/22   The history was obtained from the pt and EDP.  During history and examination, all items were able to obtain unless otherwise noted.  History of Present Illness:  Lauren Velazquez is a 62 y.o. 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   She presented to ED with similar symptoms in 07/2022 and MRI showed 1.3cm acute right thalamic infarct. Neurology was consulted but she left AMA.   Last night around midnight, she was getting up to bathroom and she felt dizzy with blurry vision. Movement made dizziness worse, it was both room spinning and lightheadedness. She waited till morning to come to ER. Currently she still has some blurry vision but improved but she still has dizziness on motion. MRI negative for acute stroke. Neurology consulted for further work up vs. Discharge home.   LSN: midnight tPA Given: No: outside window, no focal deficit, MRI neg  Past Medical History:  Diagnosis Date   Anemia    not currently under treatment   Anxiety    Asthma    during allergy season   Diabetes mellitus without complication ([place])    Hypertension    Vertigo     Past Surgical History:  Procedure Laterality Date   CARPAL TUNNEL RELEASE Right 2015   CHOLECYSTECTOMY     KNEE ARTHROSCOPY Left 05/17/2018   Procedure: ARTHROSCOPY KNEE WITH LATERAL RELEASE, PARTIAL SYNOVECTOMY;  Surgeon: Hessie Knows, MD;  Location: [facility];  Service: Orthopedics;  Laterality: Left;   KNEE ARTHROSCOPY WITH LATERAL RELEASE Right 12/28/2017   Procedure: KNEE ARTHROSCOPY WITH LATERAL RELEASE;  Surgeon: Hessie Knows, MD;  Location: [facility];  Service: Orthopedics;  Laterality: Right;   TIBIAL  TUBERCLERPLASTY Bilateral 06/01/2017   Procedure: TIBIAL TUBERCLE SPUR EXCISION;  Surgeon: Hessie Knows, MD;  Location: [facility];  Service: Orthopedics;  Laterality: Bilateral;    Family History  Problem Relation Age of Onset   Seizures Sister    Seizures Brother     Social History:  reports that she has never smoked. She has never used smokeless tobacco. She reports that she does not drink alcohol and does not use drugs.  Allergies:  Allergies  Allergen Reactions   Atorvastatin Swelling    No current facility-administered medications on file prior to encounter.   Current Outpatient Medications on File Prior to Encounter  Medication Sig Dispense Refill   albuterol (VENTOLIN HFA) 108 (90 Base) MCG/ACT inhaler Inhale 2 puffs into the lungs every 6 (six) hours as needed.     amLODipine (NORVASC) 5 MG tablet Take 1 tablet (5 mg total) by mouth daily.     aspirin 81 MG chewable tablet Chew 1 tablet (81 mg total) by mouth daily. 30 tablet 0   cloNIDine (CATAPRES) 0.1 MG tablet Take 0.1 mg by mouth 3 (three) times daily.     ezetimibe (ZETIA) 10 MG tablet Take 1 tablet (10 mg total) by mouth daily.     ferrous sulfate 325 (65 FE) MG tablet Take 1 tablet (325 mg total) by mouth 2 (two) times daily with a meal.  3   ibuprofen (ADVIL) 800 MG tablet Take 1 tablet (800 mg total) by mouth every 8 (eight) hours as  needed. 30 tablet 0   insulin aspart (NOVOLOG) 100 UNIT/ML injection Inject 12 Units into the skin 3 (three) times daily with meals. (Patient taking differently: Inject 20 Units into the skin 3 (three) times daily with meals.) 10 mL 11   insulin glargine-yfgn (SEMGLEE) 100 UNIT/ML injection Inject 1.2 mLs (120 Units total) into the skin at bedtime. (Patient taking differently: Inject 140 Units into the skin at bedtime.) 10 mL 11   lacosamide (VIMPAT) 50 MG TABS tablet Take 1 tablet (50 mg total) by mouth 2 (two) times daily. 60 tablet 0   levETIRAcetam (KEPPRA) 500 MG tablet Take 1  tablet (500 mg total) by mouth 2 (two) times daily. 60 tablet 0   mirtazapine (REMERON) 30 MG tablet Take 1 tablet (30 mg total) by mouth at bedtime. 30 tablet 1   ondansetron (ZOFRAN-ODT) 4 MG disintegrating tablet Take 1 tablet (4 mg total) by mouth every 8 (eight) hours as needed for nausea or vomiting. 15 tablet 0   oxyCODONE (OXY IR/ROXICODONE) 5 MG immediate release tablet Take 1 tablet (5 mg total) by mouth every 6 (six) hours as needed for severe pain or breakthrough pain (Nightime). 5 tablet 0   pantoprazole (PROTONIX) 40 MG tablet Take 1 tablet (40 mg total) by mouth daily at 12 noon. 30 tablet 1   prazosin (MINIPRESS) 1 MG capsule Take 1 capsule (1 mg total) by mouth at bedtime. 30 capsule 0   pregabalin (LYRICA) 75 MG capsule Take 75 mg by mouth 3 (three) times daily.     progesterone (PROMETRIUM) 200 MG capsule Take 200 mg by mouth at bedtime.     promethazine (PHENERGAN) 12.5 MG tablet Take 1 tablet (12.5 mg total) by mouth every 6 (six) hours as needed for nausea or vomiting. 20 tablet 0   vitamin B-12 (CYANOCOBALAMIN) 500 MCG tablet Take 1 tablet (500 mcg total) by mouth daily.      Review of Systems: A full ROS was attempted today and was able to be performed.  Systems assessed include - Constitutional, Eyes, HENT, Respiratory, Cardiovascular, Gastrointestinal, Genitourinary, Integument/breast, Hematologic/lymphatic, Musculoskeletal, Neurological, Behavioral/Psych, Endocrine, Allergic/Immunologic - with pertinent responses as per HPI.  Physical Examination: Temp:  [97.8 F (36.6 C)] 97.8 F (36.6 C) (01/29 1032) Pulse Rate:  [91-100] 100 (01/29 1400) Resp:  [14-18] 14 (01/29 1400) BP: (146-166)/(83-102) 162/83 (01/29 1400) SpO2:  [93 %-98 %] 98 % (01/29 1400) Weight:  [90.7 kg] 90.7 kg (01/29 1033)  General - morbid obesity, well developed, in no apparent distress.    Ophthalmologic - fundi not visualized due to noncooperation.    Cardiovascular - regular rhythm and  rate  Mental Status -  Level of arousal and orientation to time, place, and person were intact. Language including expression, naming, repetition, comprehension was assessed and found intact. Fund of Knowledge was assessed and was intact.  Cranial Nerves II - XII - II - Vision intact OU. III, IV, VI - Extraocular movements intact. V - Facial sensation intact bilaterally. VII - Facial movement intact bilaterally. VIII - Hearing & vestibular intact bilaterally. X - Palate elevates symmetrically. XI - Chin turning & shoulder shrug intact bilaterally. XII - Tongue protrusion intact.  Motor Strength - The patient's strength was normal in all extremities and pronator drift was absent.   Motor Tone & Bulk - Muscle tone was assessed at the neck and appendages and was normal.  Bulk was normal and fasciculations were absent.   Reflexes - The patient's reflexes were normal in  all extremities and she had no pathological reflexes.  Sensory - Light touch, temperature/pinprick were assessed and were normal.    Coordination - The patient had normal movements in the hands with no ataxia or dysmetria.  Tremor was absent.  Gait and Station - deferred  Data Reviewed: CT ANGIO HEAD NECK W WO CM  Result Date: 09/12/2022 CLINICAL DATA:  Neuro deficit, acute, stroke suspected. Dizziness and blurry vision. EXAM: CT ANGIOGRAPHY HEAD AND NECK TECHNIQUE: Multidetector CT imaging of the head and neck was performed using the standard protocol during bolus administration of intravenous contrast. Multiplanar CT image reconstructions and MIPs were obtained to evaluate the vascular anatomy. Carotid stenosis measurements (when applicable) are obtained utilizing NASCET criteria, using the distal internal carotid diameter as the denominator. RADIATION DOSE REDUCTION: This exam was performed according to the departmental dose-optimization program which includes automated exposure control, adjustment of the mA and/or kV  according to patient size and/or use of iterative reconstruction technique. CONTRAST:  144mL OMNIPAQUE IOHEXOL 350 MG/ML SOLN COMPARISON:  Same day MRI brain and head CT. FINDINGS: CTA NECK FINDINGS Aortic arch: Three-vessel arch configuration. Atherosclerotic calcifications of the aortic arch and arch vessel origins. Arch vessel origins are patent. Right carotid system: The common and internal carotid arteries are patent to the skull base without stenosis, aneurysm or dissection. Left carotid system: The common and internal carotid arteries are patent to the skull base without stenosis, aneurysm or dissection. Vertebral arteries: Patent from the origin to the confluence with the basilar without stenosis or dissection. Skeleton: Mild degenerative changes of the cervical spine without high-grade spinal canal stenosis. Extensive dental caries. Other neck: Unremarkable. Upper chest: Unremarkable. Review of the MIP images confirms the above findings CTA HEAD FINDINGS Anterior circulation: Intracranial ICAs are patent without stenosis or aneurysm. The proximal ACAs and MCAs are patent without stenosis or aneurysm. Distal branches are symmetric. Posterior circulation: Normal basilar artery. The SCAs, AICAs and PICAs are patent proximally. The PCAs are patent proximally without stenosis or aneurysm. Distal branches are symmetric. Venous sinuses: Patent. Anatomic variants: Dominant posterior communicating arteries with diminutive bilateral P1 segments. Review of the MIP images confirms the above findings IMPRESSION: 1. No acute intracranial abnormality. 2. No large vessel occlusion or hemodynamically significant stenosis. Aortic Atherosclerosis (ICD10-I70.0). Electronically Signed   By: Emmit Alexanders M.D.   On: 09/12/2022 16:40   CT Angio Chest PE W and/or Wo Contrast  Result Date: 09/12/2022 CLINICAL DATA:  Nonlocalized abdomen pain starting at midnight. EXAM: CT ANGIOGRAPHY CHEST CT ABDOMEN AND PELVIS WITH CONTRAST  TECHNIQUE: Multidetector CT imaging of the chest was performed using the standard protocol during bolus administration of intravenous contrast. Multiplanar CT image reconstructions and MIPs were obtained to evaluate the vascular anatomy. Multidetector CT imaging of the abdomen and pelvis was performed using the standard protocol during bolus administration of intravenous contrast. RADIATION DOSE REDUCTION: This exam was performed according to the departmental dose-optimization program which includes automated exposure control, adjustment of the mA and/or kV according to patient size and/or use of iterative reconstruction technique. CONTRAST:  177mL OMNIPAQUE IOHEXOL 350 MG/ML SOLN COMPARISON:  CT abdomen pelvis August 27, 2022 FINDINGS: CTA CHEST FINDINGS Cardiovascular: Satisfactory opacification of the pulmonary arteries to the segmental level. No evidence of pulmonary embolism. Normal heart size. No pericardial effusion. Mediastinum/Nodes: No enlarged mediastinal, hilar, or axillary lymph nodes. Thyroid gland, trachea, and esophagus demonstrate no significant findings. Lungs/Pleura: Lungs are clear. No pleural effusion or pneumothorax. Musculoskeletal: Degenerative joint changes of the spine.  Review of the MIP images confirms the above findings. CT ABDOMEN and PELVIS FINDINGS Hepatobiliary: Diffuse low density of the liver without focal liver lesion. Gallbladder is not seen. Biliary tree is stable. Pancreas: Unremarkable. No pancreatic ductal dilatation or surrounding inflammatory changes. Spleen: Normal in size without focal abnormality. Adrenals/Urinary Tract: Adrenal glands are unremarkable. No hydronephrosis is identified bilaterally. There is a 1.8 cm simple cyst in the posterior lower pole right kidney. No follow-up is recommended. The left kidney is normal. Previously noted left kidney stone is not as well appreciated and may be obscured by contrast in calices. The bladder is normal. Stomach/Bowel: Small  bowel intussusception is noted in the right lower quadrant. There is no evidence of small-bowel obstruction. Colon is normal. The appendix is normal. The stomach is normal. Vascular/Lymphatic: Aortic atherosclerosis. No enlarged abdominal or pelvic lymph nodes. Reproductive: The uterus is normal. There is a 4.8 x 4.2 cm simple cyst in the left ovary. Other: None. Musculoskeletal: Degenerative joint changes of the spine are noted. Review of the MIP images confirms the above findings. IMPRESSION: 1. No pulmonary embolism or acute abnormality identified in the chest. 2. Small bowel intussusception in the right lower quadrant. There is no evidence of small-bowel obstruction. 3. 4.8 cm simple cyst in the left ovary. Recommend follow-up US in 6-12 months. Note: This recommendation does not apply to premenarchal patients and to those with increased risk (genetic, family history, elevated tumor markers or other high-risk factors) of ovarian cancer. Reference: JACR 2020 Feb; 17(2):248-254 4. Fatty infiltration of liver. 5. Aortic atherosclerosis. Aortic Atherosclerosis (ICD10-I70.0). Electronically Signed   By: Sherian Rein M.D.   On: 09/12/2022 16:35   CT ABDOMEN PELVIS W CONTRAST  Result Date: 09/12/2022 CLINICAL DATA:  Nonlocalized abdomen pain starting at midnight. EXAM: CT ANGIOGRAPHY CHEST CT ABDOMEN AND PELVIS WITH CONTRAST TECHNIQUE: Multidetector CT imaging of the chest was performed using the standard protocol during bolus administration of intravenous contrast. Multiplanar CT image reconstructions and MIPs were obtained to evaluate the vascular anatomy. Multidetector CT imaging of the abdomen and pelvis was performed using the standard protocol during bolus administration of intravenous contrast. RADIATION DOSE REDUCTION: This exam was performed according to the departmental dose-optimization program which includes automated exposure control, adjustment of the mA and/or kV according to patient size and/or use  of iterative reconstruction technique. CONTRAST:  OMNIPAQUE IOHEXOL 350 MG/ML SOLN COMPARISON:  CT abdomen pelvis August 27, 2022 FINDINGS: CTA CHEST FINDINGS Cardiovascular: Satisfactory opacification of the pulmonary arteries to the segmental level. No evidence of pulmonary embolism. Normal heart size. No pericardial effusion. Mediastinum/Nodes: No enlarged mediastinal, hilar, or axillary lymph nodes. Thyroid gland, trachea, and esophagus demonstrate no significant findings. Lungs/Pleura: Lungs are clear. No pleural effusion or pneumothorax. Musculoskeletal: Degenerative joint changes of the spine. Review of the MIP images confirms the above findings. CT ABDOMEN and PELVIS FINDINGS Hepatobiliary: Diffuse low density of the liver without focal liver lesion. Gallbladder is not seen. Biliary tree is stable. Pancreas: Unremarkable. No pancreatic ductal dilatation or surrounding inflammatory changes. Spleen: Normal in size without focal abnormality. Adrenals/Urinary Tract: Adrenal glands are unremarkable. No hydronephrosis is identified bilaterally. There is a 1.8 cm simple cyst in the posterior lower pole right kidney. No follow-up is recommended. The left kidney is normal. Previously noted left kidney stone is not as well appreciated and may be obscured by contrast in calices. The bladder is normal. Stomach/Bowel: Small bowel intussusception is noted in the right lower quadrant. There is no evidence of  small-bowel obstruction. Colon is normal. The appendix is normal. The stomach is normal. Vascular/Lymphatic: Aortic atherosclerosis. No enlarged abdominal or pelvic lymph nodes. Reproductive: The uterus is normal. There is a 4.8 x 4.2 cm simple cyst in the left ovary. Other: None. Musculoskeletal: Degenerative joint changes of the spine are noted. Review of the MIP images confirms the above findings. IMPRESSION: 1. No pulmonary embolism or acute abnormality identified in the chest. 2. Small bowel intussusception  in the right lower quadrant. There is no evidence of small-bowel obstruction. 3. 4.8 cm simple cyst in the left ovary. Recommend follow-up US in 6-12 months. Note: This recommendation does not apply to premenarchal patients and to those with increased risk (genetic, family history, elevated tumor markers or other high-risk factors) of ovarian cancer. Reference: JACR 2020 Feb; 17(2):248-254 4. Fatty infiltration of liver. 5. Aortic atherosclerosis. Aortic Atherosclerosis (ICD10-I70.0). Electronically Signed   By: Sherian Rein M.D.   On: 09/12/2022 16:35   CT HEAD WO CONTRAST  Result Date: 09/12/2022 CLINICAL DATA:  Patient presents with blurry vision and hypertension. Felt like she was having a stroke last evening. EXAM: CT HEAD WITHOUT CONTRAST TECHNIQUE: Contiguous axial images were obtained from the base of the skull through the vertex without intravenous contrast. RADIATION DOSE REDUCTION: This exam was performed according to the departmental dose-optimization program which includes automated exposure control, adjustment of the mA and/or kV according to patient size and/or use of iterative reconstruction technique. COMPARISON:  CT 08/08/2019 and MRI from earlier today FINDINGS: Brain: No evidence of acute infarction, hemorrhage, hydrocephalus, extra-axial collection or mass lesion/mass effect. Remote infarct involving the posteromedial right parietal lobe is again noted, image 22/3 There is mild low-attenuation within the subcortical and periventricular white matter compatible with chronic microvascular disease. Vascular: No hyperdense vessel or unexpected calcification. Skull: Normal. Negative for fracture or focal lesion. Sinuses/Orbits: No acute finding. Other: None IMPRESSION: 1. No acute intracranial abnormalities. 2. Mild chronic microvascular disease. 3. Remote right parietal lobe infarct. Electronically Signed   By: Signa Kell M.D.   On: 09/12/2022 12:56   MR BRAIN WO CONTRAST  Result Date:  09/12/2022 CLINICAL DATA:  Dizziness and blurry vision EXAM: MRI HEAD WITHOUT CONTRAST TECHNIQUE: Multiplanar, multiecho pulse sequences of the brain and surrounding structures were obtained without intravenous contrast. COMPARISON:  08/07/2022 FINDINGS: Evaluation is somewhat limited by motion artifact. Brain: No restricted diffusion to suggest acute or subacute infarct. No acute hemorrhage, mass, mass effect, or midline shift. No hydrocephalus or extra-axial collection. No hemosiderin deposition to suggest remote hemorrhage. Normal pituitary and craniocervical junction. Redemonstrated remote infarcts involving the posteromedial right parietal lobe and medial left occipital lobe. Scattered T2 hyperintense signal in the periventricular white matter, likely the sequela of mild-to-moderate chronic small vessel ischemic disease. Vascular: Patent arterial flow voids. Skull and upper cervical spine: Normal marrow signal. Sinuses/Orbits: Mild mucosal thickening in the maxillary sinuses. No acute finding in the orbits. Other: The mastoid air cells are well aerated. IMPRESSION: No acute intracranial process. No evidence of acute or subacute infarct. Electronically Signed   By: Wiliam Ke M.D.   On: 09/12/2022 12:42   DG Chest Portable 1 View  Result Date: 09/12/2022 CLINICAL DATA:  Shortness of breath EXAM: PORTABLE CHEST 1 VIEW COMPARISON:  08/20/2022 FINDINGS: The heart size and mediastinal contours are within normal limits given AP technique. Both lungs are clear. The visualized skeletal structures are unremarkable. IMPRESSION: No active disease. Electronically Signed   By: Wiliam Ke M.D.   On: 09/12/2022  11:40   CT ABDOMEN PELVIS W CONTRAST  Result Date: 08/27/2022 CLINICAL DATA:  Generalized abdominal pain. Nausea. Cholecystectomy 08/05/2022. Recent UTI. EXAM: CT ABDOMEN AND PELVIS WITH CONTRAST TECHNIQUE: Multidetector CT imaging of the abdomen and pelvis was performed using the standard protocol  following bolus administration of intravenous contrast. RADIATION DOSE REDUCTION: This exam was performed according to the departmental dose-optimization program which includes automated exposure control, adjustment of the mA and/or kV according to patient size and/or use of iterative reconstruction technique. CONTRAST:  151mL OMNIPAQUE IOHEXOL 300 MG/ML  SOLN COMPARISON:  08/20/2022 CT abdomen/pelvis. FINDINGS: Lower chest: No significant pulmonary nodules or acute consolidative airspace disease. Hepatobiliary: Normal liver size. No liver mass. Cholecystectomy. No fluid collection in the cholecystectomy bed. Bile ducts are within normal post cholecystectomy limits with CBD diameter 7 mm, unchanged. Pancreas: Normal, with no mass or duct dilation. Spleen: Normal size. No mass. Adrenals/Urinary Tract: Normal adrenals. Exophytic 1.8 cm posterior lower right renal cyst, unchanged, for which no follow-up imaging is recommended. Nonobstructing 5 mm interpolar left renal stone. No hydronephrosis. Normal bladder. Stomach/Bowel: Normal non-distended stomach. Short segment small bowel intussusception in right lower quadrant (series 2/image 54) without small bowel dilatation or definite wall thickening. This finding is in close proximity to a small 1.4 cm intraluminal small bowel lipoma (series 2/image 62). Normal appendix. Normal large bowel with no diverticulosis, large bowel wall thickening or pericolonic fat stranding. Vascular/Lymphatic: Atherosclerotic nonaneurysmal abdominal aorta. Patent portal, splenic, hepatic and renal veins. No pathologically enlarged lymph nodes in the abdomen or pelvis. Reproductive: Stable simple 4.9 cm left adnexal cyst (series 2/image 64). No right adnexal mass. Normal uterus. Other: No pneumoperitoneum, ascites or focal fluid collection. Musculoskeletal: No aggressive appearing focal osseous lesions. Mild thoracolumbar spondylosis. IMPRESSION: 1. Short segment small bowel intussusception in  the right lower quadrant without small bowel dilatation or definite wall thickening. This finding is in close proximity to a small 1.4 cm intraluminal small bowel lipoma. Findings are most likely transient and of doubtful clinical significance. 2. No evidence of fluid collection in the cholecystectomy bed. Bile ducts are within normal post cholecystectomy limits. 3. Nonobstructing left nephrolithiasis. 4. Stable 4.9 cm simple left adnexal cyst. No follow-up imaging recommended. 5.  Aortic Atherosclerosis (ICD10-I70.0). Electronically Signed   By: Ilona Sorrel M.D.   On: 08/27/2022 16:33   CT ABDOMEN PELVIS W CONTRAST  Result Date: 08/20/2022 CLINICAL DATA:  Right lower quadrant pain EXAM: CT ABDOMEN AND PELVIS WITH CONTRAST TECHNIQUE: Multidetector CT imaging of the abdomen and pelvis was performed using the standard protocol following bolus administration of intravenous contrast. RADIATION DOSE REDUCTION: This exam was performed according to the departmental dose-optimization program which includes automated exposure control, adjustment of the mA and/or kV according to patient size and/or use of iterative reconstruction technique. CONTRAST:  1106mL OMNIPAQUE IOHEXOL 300 MG/ML  SOLN COMPARISON:  CT abdomen and pelvis 08/07/2022 FINDINGS: Lower chest: No acute abnormality. Hepatobiliary: No focal liver abnormality is seen. Status post cholecystectomy. No biliary dilatation. Pancreas: Unremarkable. No pancreatic ductal dilatation or surrounding inflammatory changes. Spleen: Normal in size without focal abnormality. Adrenals/Urinary Tract: Cortical cysts in the right kidney measuring 18 mm is unchanged. There is no hydronephrosis or perinephric fat stranding. There are punctate nonobstructing left renal calculi. The adrenal glands are within normal limits. Bladder is within normal limits. Stomach/Bowel: Stomach is within normal limits. Appendix appears normal. There is questionable wall thickening of the hepatic  flexure of the colon versus normal under distension. There is no  surrounding inflammation or pneumatosis. No dilated bowel loops are seen. There is colonic diverticulosis without evidence for acute diverticulitis. Vascular/Lymphatic: No significant vascular findings are present. No enlarged abdominal or pelvic lymph nodes. Reproductive: There is a 4.8 cm rounded left adnexal cystic structure which is unchanged. Right adnexa and uterus are within normal limits. Other: There is some new skin thickening and subcutaneous inflammation of the anterior abdominal wall inferior to the umbilicus. No focal fluid collection. Musculoskeletal: No acute or significant osseous findings. IMPRESSION: 1. Questionable wall thickening of the hepatic flexure of the colon versus normal under distension. Correlate clinically for colitis. 2. New skin thickening and subcutaneous inflammation of the anterior abdominal wall inferior to the umbilicus. Correlate clinically for cellulitis. No focal fluid collection. 3. Nonobstructing left renal calculi. 4. Colonic diverticulosis. 5. Stable 4.8 cm left adnexal cyst.  Consider follow-up ultrasound. 6. Right Bosniak I benign renal cyst measuring 1.8 cm. No follow-up imaging is recommended. JACR 2018 Feb; 264-273, Management of the Incidental Renal Mass on CT, [employer] 2021; 814-848, Bosniak Classification of Cystic Renal Masses, Version 2019. Electronically Signed   By: Ronney Asters M.D.   On: 08/20/2022 22:45   DG Chest 1 View  Result Date: 08/20/2022 CLINICAL DATA:  Chest pain and shortness of breath. EXAM: CHEST  1 VIEW COMPARISON:  None Available. FINDINGS: The heart size and mediastinal contours are within normal limits. Both lungs are clear. The visualized skeletal structures are unremarkable. IMPRESSION: No active disease. Electronically Signed   By: Ronney Asters M.D.   On: 08/20/2022 19:47    Assessment: 62 y.o. female with PMH of HTN, DM, obesity, seizure, hx of DVT on Xarelto  for 6 months, OSA, chronic pain syndrome, and stroke in 06/2021 and 07/2022 on ASA at home and anxiety presented to ED for dizziness and blurry vision started midnight.  Exam non focal. CT no acute finding. MRI negative for acute stroke. Currently still complaining of dizziness associated with motion but blurry vision improved. Given hx of embolic stroke, recommend CTA head and neck. If negative, can d/c home, continue home ASA and zetia and follow up with outpt neurology.   Stroke Risk Factors - hyperlipidemia, hypertension, and hx of stroke morbid obesity  Plan: CTA head and neck given hx of strokes Stroke risk factor modification If CTA negative, pt can be d/c from neuro standpoint and continue home ASA and Zetia If CTA positive, please call neurology back for further recommendations Discussed with Dr. Jari Pigg ED physician Pt will need follow up with outpt neurology   Thank you for this consultation and allowing [place] to participate in the care of this patient.  Rosalin Hawking, MD PhD Stroke Neurology 09/12/2022 10:20 PM

## 2022-09-12 NOTE — ED Triage Notes (Signed)
Presents with blurred vision and HTN  states she felt like she is having a stroke  LKW was midnight last pm

## 2022-09-12 NOTE — ED Provider Notes (Signed)
[facility] Provider Note    Event Date/Time   First MD Initiated Contact with Patient 09/12/22 1106     (approximate)   History   Blurred Vision and Dizziness   HPI  Lauren Velazquez is a 62 y.o. female with history of anxiety who comes in with blurred vision and hypertension and felt like she was having a stroke.  Patient reports a prior history of stroke where she had no symptoms and she reports she never followed up with neurology so unclear if she is supposed to be on a blood thinner.  She reports her symptoms started around midnight with dizziness and blurred vision she denies any chest pain does report a little bit of shortness of breath.  Denies any new abdominal pain.    Physical Exam   Triage Vital Signs: ED Triage Vitals  Enc Vitals Group     BP 09/12/22 1032 (!) 166/102     Pulse Rate 09/12/22 1032 100     Resp 09/12/22 1032 18     Temp 09/12/22 1032 97.8 F (36.6 C)     Temp Source 09/12/22 1032 Oral     SpO2 09/12/22 1032 96 %     Weight 09/12/22 1033 200 lb (90.7 kg)     Height 09/12/22 1033 5\' 2"  (1.575 m)     Head Circumference --      Peak Flow --      Pain Score 09/12/22 1033 4     Pain Loc --      Pain Edu? --      Excl. in [place]? --     Most recent vital signs: Vitals:   09/12/22 1245 09/12/22 1300  BP: (!) 146/88 (!) 153/84  Pulse: 91 98  Resp:  15  Temp:    SpO2: 95% 93%     General: Awake, no distress.  CV:  Good peripheral perfusion.  Resp:  Normal effort.  Abd:  No distention.  Soft nontender Other:  Cranial nerves appear intact.  Equal strength in arms and legs.  Finger-to-nose intact.   ED Results / Procedures / Treatments   Labs (all labs ordered are listed, but only abnormal results are displayed) Labs Reviewed  CBC - Abnormal; Notable for the following components:      Result Value   Hemoglobin 9.9 (*)    HCT 35.4 (*)    MCV 77.6 (*)    MCH 21.7 (*)    MCHC 28.0 (*)    RDW 16.0 (*)    All other  components within normal limits  COMPREHENSIVE METABOLIC PANEL - Abnormal; Notable for the following components:   Potassium 3.0 (*)    Glucose, Bld 250 (*)    All other components within normal limits  RESP PANEL BY RT-PCR (RSV, FLU A&B, COVID)  RVPGX2  PROTIME-INR  APTT  DIFFERENTIAL  ETHANOL  LIPASE, BLOOD  URINALYSIS, ROUTINE W REFLEX MICROSCOPIC  TROPONIN I (HIGH SENSITIVITY)  TROPONIN I (HIGH SENSITIVITY)     EKG  My interpretation of EKG:  Sinus tachycardia rate of 103 without any ST elevation or T wave inversions, normal intervals  RADIOLOGY I have reviewed the  CT head personally interpreted no evidence of intercranial hemorrhage   PROCEDURES:  Critical Care performed: No  Procedures   MEDICATIONS ORDERED IN ED: Medications  LORazepam (ATIVAN) injection 0.5 mg (0.5 mg Intravenous Given 09/12/22 1200)     IMPRESSION / MDM / ASSESSMENT AND PLAN / ED COURSE  I reviewed  the triage vital signs and the nursing notes.   Patient's presentation is most consistent with acute presentation with potential threat to life or bodily function.   Patient comes in with some dizziness and blurred vision.  She is out of the window for stroke code -not consistent with LVO.  CT head will be ordered an MRI given history of prior strokes and basic labs ordered patient be given some Ativan to help with anxiety.  Lipase is normal troponin is negative COVID, flu are negative CBC shows stable hemoglobin potassium slightly low will give some oral repletion.  CT head was negative just some old infarcts MRI brain negative  Patient on repeat evaluation just reports overall still feeling unwell.  No signs of DVT on examination.  She is not on any current blood thinners.  She left without seeing neurology last time I discussed with Dr. Erlinda Hong from neurology will admit for TIA workup given this was never completed for observation.  Patient felt more comfortable with this plan.  She states that  she left last time because she was not aware that she was supposed to be seen by neurology or that it was important and she has not been taking any anticoagulation except for taking a baby aspirin this morning when she started having her symptoms.  The patient is on the cardiac monitor to evaluate for evidence of arrhythmia and/or significant heart rate changes.      FINAL CLINICAL IMPRESSION(S) / ED DIAGNOSES   Final diagnoses:  Dizziness  TIA (transient ischemic attack)  History of stroke in prior 3 months     Rx / DC Orders   ED Discharge Orders     None        Note:  This document was prepared using Dragon voice recognition software and may include unintentional dictation errors.   Vanessa Wilkinson, MD 09/12/22 [phone]

## 2022-09-12 NOTE — ED Triage Notes (Signed)
Pt presents to the ED via POV due to dizziness and blurry vision that started at midnight. Pt states she states at home to wait it out. Pt states she has a hx of strokes. Pt is tearful in triage. Pt A&Ox4

## 2022-09-12 NOTE — Consult Note (Signed)
Initial Consultation Note   Patient: Lauren Velazquez DSK:[id number] DOB: 08/27/1960 PCP: Denton Lank, MD DOA: 09/12/2022 DOS: the patient was seen and examined on 09/12/2022 Primary service: Arta Silence, MD  Referring physician: Dr. Jari Pigg Reason for consult: TIA work up  [facility] by ED provider Dr. Jari Pigg for consideration of admission for a TIA evaluation.  Per chart review, patient has a history of CVA in November 2022 at which time a full evaluation was completed including TTE, TEE, and CTA of the head and neck.  At that time, patient was started on DAPT.  On 08/07/2022, patient presented to the ED for blurred vision, motor dysfunction and abdominal pain.  MRI at that time demonstrated an acute ischemic nonhemorrhagic right thalamic infarct.  Patient was then admitted at the time but subsequently left AMA.    Today, she is presenting with blurred vision.  MRI of the brain is negative.  Discussed previous workup with Dr. Erlinda Hong, who recommended CTA.  If negative, could follow-up outpatient with neurology.  CTA of the head/neck/chest/abdomen all negative for acute findings.   At this time, no indication for admission for TIA evaluation given patient has completed workup in the past.  Recommend she establish with outpatient neurology and continue her home medications.  Author: Jose Persia, MD 09/12/2022 4:45 PM  For on call review [email or web address].

## 2022-09-28 ENCOUNTER — Other Ambulatory Visit: Payer: Self-pay | Admitting: Family Medicine

## 2022-09-28 DIAGNOSIS — R1310 Dysphagia, unspecified: Secondary | ICD-10-CM

## 2022-10-11 ENCOUNTER — Ambulatory Visit: Payer: 59

## 2022-10-18 ENCOUNTER — Ambulatory Visit
Admission: RE | Admit: 2022-10-18 | Discharge: 2022-10-18 | Disposition: A | Discharge status: Home / Self Care | Payer: 59 | Source: Ambulatory Visit | Attending: Family Medicine | Admitting: Family Medicine

## 2022-10-18 DIAGNOSIS — R1310 Dysphagia, unspecified: Secondary | ICD-10-CM | POA: Insufficient documentation

## 2022-11-04 DIAGNOSIS — R42 Dizziness and giddiness: Secondary | ICD-10-CM

## 2022-11-04 DIAGNOSIS — G459 Transient cerebral ischemic attack, unspecified: Secondary | ICD-10-CM

## 2022-12-16 ENCOUNTER — Other Ambulatory Visit: Payer: Self-pay | Admitting: Orthopedic Surgery

## 2022-12-16 DIAGNOSIS — M4807 Spinal stenosis, lumbosacral region: Secondary | ICD-10-CM

## 2022-12-30 ENCOUNTER — Other Ambulatory Visit: Payer: 59

## 2023-01-07 ENCOUNTER — Ambulatory Visit
Admission: RE | Admit: 2023-01-07 | Discharge: 2023-01-07 | Disposition: A | Discharge status: Home / Self Care | Payer: 59 | Source: Ambulatory Visit | Attending: Orthopedic Surgery | Admitting: Orthopedic Surgery

## 2023-01-07 DIAGNOSIS — M4807 Spinal stenosis, lumbosacral region: Secondary | ICD-10-CM

## 2023-02-12 ENCOUNTER — Emergency Department: Payer: 59

## 2023-02-12 ENCOUNTER — Emergency Department
Admission: EM | Admit: 2023-02-12 | Discharge: 2023-02-12 | Disposition: A | Discharge status: Home / Self Care | Payer: 59 | Attending: Emergency Medicine | Admitting: Emergency Medicine

## 2023-02-12 ENCOUNTER — Other Ambulatory Visit: Payer: Self-pay

## 2023-02-12 DIAGNOSIS — E119 Type 2 diabetes mellitus without complications: Secondary | ICD-10-CM | POA: Diagnosis not present

## 2023-02-12 DIAGNOSIS — D649 Anemia, unspecified: Secondary | ICD-10-CM

## 2023-02-12 DIAGNOSIS — R531 Weakness: Secondary | ICD-10-CM | POA: Diagnosis present

## 2023-02-12 DIAGNOSIS — Z8673 Personal history of transient ischemic attack (TIA), and cerebral infarction without residual deficits: Secondary | ICD-10-CM | POA: Diagnosis not present

## 2023-02-12 DIAGNOSIS — I10 Essential (primary) hypertension: Secondary | ICD-10-CM | POA: Diagnosis not present

## 2023-02-12 LAB — CBC WITH DIFFERENTIAL/PLATELET
Abs Immature Granulocytes: 0.01 10*3/uL (ref 0.00–0.07)
Basophils Absolute: 0.1 10*3/uL (ref 0.0–0.1)
Basophils Relative: 1 %
Eosinophils Absolute: 0 10*3/uL (ref 0.0–0.5)
Eosinophils Relative: 1 %
HCT: 32.9 % — ABNORMAL LOW (ref 36.0–46.0)
Hemoglobin: 9.3 g/dL — ABNORMAL LOW (ref 12.0–15.0)
Immature Granulocytes: 0 %
Lymphocytes Relative: 13 %
Lymphs Abs: 0.5 10*3/uL — ABNORMAL LOW (ref 0.7–4.0)
MCH: 22.1 pg — ABNORMAL LOW (ref 26.0–34.0)
MCHC: 28.3 g/dL — ABNORMAL LOW (ref 30.0–36.0)
MCV: 78.3 fL — ABNORMAL LOW (ref 80.0–100.0)
Monocytes Absolute: 0.3 10*3/uL (ref 0.1–1.0)
Monocytes Relative: 8 %
Neutro Abs: 3 10*3/uL (ref 1.7–7.7)
Neutrophils Relative %: 77 %
Platelets: 245 10*3/uL (ref 150–400)
RBC: 4.2 MIL/uL (ref 3.87–5.11)
RDW: 16.8 % — ABNORMAL HIGH (ref 11.5–15.5)
WBC: 4 10*3/uL (ref 4.0–10.5)
nRBC: 0 % (ref 0.0–0.2)

## 2023-02-12 LAB — COMPREHENSIVE METABOLIC PANEL
ALT: 19 U/L (ref 0–44)
AST: 22 U/L (ref 15–41)
Albumin: 3.9 g/dL (ref 3.5–5.0)
Alkaline Phosphatase: 85 U/L (ref 38–126)
Anion gap: 10 (ref 5–15)
BUN: 7 mg/dL (ref 6–20)
CO2: 26 mmol/L (ref 22–32)
Calcium: 9 mg/dL (ref 8.9–10.3)
Chloride: 103 mmol/L (ref 98–111)
Creatinine, Ser: 0.63 mg/dL (ref 0.44–1.00)
GFR, Estimated: >60 mL/min (ref >60)
Glucose, Bld: 208 mg/dL — ABNORMAL HIGH (ref 70–99)
Potassium: 4 mmol/L (ref 3.5–5.1)
Sodium: 139 mmol/L (ref 135–145)
Total Bilirubin: 0.6 mg/dL (ref 0.3–1.2)
Total Protein: 7.6 g/dL (ref 6.5–8.1)

## 2023-02-12 LAB — TROPONIN I (HIGH SENSITIVITY): Troponin I (High Sensitivity): 5 ng/L (ref <18)

## 2023-02-12 LAB — URINALYSIS, ROUTINE W REFLEX MICROSCOPIC
Bacteria, UA: NONE SEEN
Bilirubin Urine: NEGATIVE
Glucose, UA: NEGATIVE mg/dL
Hgb urine dipstick: NEGATIVE
Ketones, ur: 20 mg/dL — AB
Leukocytes,Ua: NEGATIVE
Nitrite: NEGATIVE
Protein, ur: 30 mg/dL — AB
Specific Gravity, Urine: 1.023 (ref 1.005–1.030)
pH: 6 (ref 5.0–8.0)

## 2023-02-12 LAB — TYPE AND SCREEN
ABO/RH(D): B NEG
Antibody Screen: NEGATIVE

## 2023-02-12 MED ORDER — LIDOCAINE VISCOUS HCL 2 % MT SOLN
15.0000 mL | Freq: Once | OROMUCOSAL | Status: AC
  Administered 2023-02-12: 15 mL via ORAL
  Filled 2023-02-12: qty 15

## 2023-02-12 MED ORDER — FERROUS SULFATE 325 (65 FE) MG PO TABS
325.0000 mg | ORAL_TABLET | Freq: Once | ORAL | Status: AC
  Administered 2023-02-12: 325 mg via ORAL
  Filled 2023-02-12: qty 1

## 2023-02-12 MED ORDER — ONDANSETRON 4 MG PO TBDP: 4.0000 mg | ORAL_TABLET | Freq: Three times a day (TID) | ORAL | 0 refills | Status: AC | PRN

## 2023-02-12 MED ORDER — FAMOTIDINE 20 MG PO TABS
20.0000 mg | ORAL_TABLET | Freq: Once | ORAL | Status: AC
  Administered 2023-02-12: 20 mg via ORAL
  Filled 2023-02-12: qty 1

## 2023-02-12 MED ORDER — ONDANSETRON HCL 4 MG/2ML IJ SOLN
4.0000 mg | Freq: Once | INTRAMUSCULAR | Status: AC
  Administered 2023-02-12: 4 mg via INTRAVENOUS
  Filled 2023-02-12: qty 2

## 2023-02-12 MED ORDER — FERROUS SULFATE 325 (65 FE) MG PO TBEC: 325.0000 mg | DELAYED_RELEASE_TABLET | Freq: Two times a day (BID) | ORAL | 3 refills | Status: AC

## 2023-02-12 MED ORDER — LACTATED RINGERS IV BOLUS
1000.0000 mL | Freq: Once | INTRAVENOUS | Status: AC
  Administered 2023-02-12: 1000 mL via INTRAVENOUS

## 2023-02-12 MED ORDER — FERROUS SULFATE 325 (65 FE) MG PO TBEC: 325.0000 mg | DELAYED_RELEASE_TABLET | Freq: Two times a day (BID) | ORAL | 3 refills | Status: DC

## 2023-02-12 MED ORDER — ALUM & MAG HYDROXIDE-SIMETH 200-200-20 MG/5ML PO SUSP
30.0000 mL | Freq: Once | ORAL | Status: AC
  Administered 2023-02-12: 30 mL via ORAL
  Filled 2023-02-12: qty 30

## 2023-02-12 NOTE — Discharge Instructions (Addendum)
Your labs today show significant anemia and we will start you on iron supplementation for this.  You should schedule follow-up with hematology for recheck of your blood counts and for possible iron infusions.  Please return to the ER for reevaluation if you develop any new or worsening symptoms.

## 2023-02-12 NOTE — ED Notes (Signed)
Provided patient with a cup of ice per her request.

## 2023-02-12 NOTE — ED Provider Notes (Signed)
[facility] Provider Note    Event Date/Time   First MD Initiated Contact with Patient 02/12/23 1522     (approximate)   History   Chief Complaint Weakness (Patient presents with weakness x 1 week; She was recently diagnosed with IDA and PCP wanted to set her up with Fe infusions but she said she "didn't listen" and never got the infusions)   HPI  Lauren Velazquez is a 62 y.o. female with past medical history of hypertension, hyperlipidemia, diabetes, stroke, DVT, and seizure presents to the ED complaining of generalized weakness.  Patient reports that she has had worsening weakness for about the past 2 weeks, was recently seen by her PCP and told that she was anemic due to very low iron.  She states that she did not follow-up with this, has not been taking any iron supplementation and is concerned she needs a blood transfusion.  She has not noticed any bleeding and denies any dark tarry stool.  She does report that she has been feeling nauseous with occasional vomiting, denies associated diarrhea or abdominal pain.  She has not had any fevers, cough, chest pain, or shortness of breath.     Physical Exam   Triage Vital Signs: ED Triage Vitals [02/12/23 1509]  Enc Vitals Group     BP (!) 211/78     Pulse Rate 96     Resp 18     Temp 98.7 F (37.1 C)     Temp Source Oral     SpO2 96 %     Weight 215 lb (97.5 kg)     Height 5\' 2"  (1.575 m)     Head Circumference      Peak Flow      Pain Score 7     Pain Loc      Pain Edu?      Excl. in [place]?     Most recent vital signs: Vitals:   02/12/23 1630 02/12/23 1704  BP:  (!) 208/91  Pulse: 89 71  Resp: (!) 21   Temp:    SpO2: 93% 93%    Constitutional: Alert and oriented. Eyes: Conjunctivae are normal. Head: Atraumatic. Nose: No congestion/rhinnorhea. Mouth/Throat: Mucous membranes are moist.  Cardiovascular: Normal rate, regular rhythm. Grossly normal heart sounds.  2+ radial pulses  bilaterally. Respiratory: Normal respiratory effort.  No retractions. Lungs CTAB. Gastrointestinal: Soft and nontender. No distention. Musculoskeletal: No lower extremity tenderness nor edema.  Neurologic:  Normal speech and language. No gross focal neurologic deficits are appreciated.    ED Results / Procedures / Treatments   Labs (all labs ordered are listed, but only abnormal results are displayed) Labs Reviewed  COMPREHENSIVE METABOLIC PANEL - Abnormal; Notable for the following components:      Result Value   Glucose, Bld 208 (*)    All other components within normal limits  CBC WITH DIFFERENTIAL/PLATELET - Abnormal; Notable for the following components:   Hemoglobin 9.3 (*)    HCT 32.9 (*)    MCV 78.3 (*)    MCH 22.1 (*)    MCHC 28.3 (*)    RDW 16.8 (*)    Lymphs Abs 0.5 (*)    All other components within normal limits  URINALYSIS, ROUTINE W REFLEX MICROSCOPIC - Abnormal; Notable for the following components:   Color, Urine YELLOW (*)    APPearance HAZY (*)    Ketones, ur 20 (*)    Protein, ur 30 (*)    All other  components within normal limits  TYPE AND SCREEN  TROPONIN I (HIGH SENSITIVITY)     EKG  ED ECG REPORT I, Chesley Noon, the attending physician, personally viewed and interpreted this ECG.   Date: 02/12/2023  EKG Time: 15:20  Rate: 74  Rhythm: normal sinus rhythm  Axis: Normal  Intervals:none  ST&T Change: None  RADIOLOGY Chest x-ray reviewed and interpreted by me with no infiltrate, edema, or effusion.  PROCEDURES:  Critical Care performed: No  Procedures   MEDICATIONS ORDERED IN ED: Medications  ondansetron (ZOFRAN) injection 4 mg (4 mg Intravenous Given 02/12/23 1624)  lactated ringers bolus 1,000 mL (1,000 mLs Intravenous New Bag/Given 02/12/23 1624)  alum & mag hydroxide-simeth (MAALOX/MYLANTA) 200-200-20 MG/5ML suspension 30 mL (30 mLs Oral Given 02/12/23 1632)    And  lidocaine (XYLOCAINE) 2 % viscous mouth solution 15 mL (15 mLs  Oral Given 02/12/23 1632)  ferrous sulfate tablet 325 mg (325 mg Oral Given 02/12/23 1721)  famotidine (PEPCID) tablet 20 mg (20 mg Oral Given 02/12/23 1721)     IMPRESSION / MDM / ASSESSMENT AND PLAN / ED COURSE  I reviewed the triage vital signs and the nursing notes.                              62 y.o. female with past medical history of hypertension, hyperlipidemia, diabetes, seizures, stroke, and DVT who presents to the ED complaining of increasing generalized weakness for the past 2 weeks.  Patient's presentation is most consistent with acute presentation with potential threat to life or bodily function.  Differential diagnosis includes, but is not limited to, anemia, electrolyte abnormality, AKI, pneumonia, UTI.  Patient nontoxic-appearing and in no acute distress, vital signs are remarkable for hypertension but otherwise reassuring.  Patient describes generalized weakness and has no focal neurologic deficits on exam, doubt stroke.  EKG shows no evidence of arrhythmia or ischemia and she denies any chest pain or shortness of breath, low suspicion for cardiac etiology but troponin is pending.  Labs thus far do confirm anemia which is microcytic, although hemoglobin is not significantly downtrending from previous and no significant leukocytosis noted.  Chest x-ray without evidence of infectious process, urinalysis is pending.  We will treat symptomatically with IV fluids and IV Zofran, reassess following additional labs.  Additional labs are reassuring with mild hyperglycemia but no evidence of DKA, AKI, or electrolyte abnormality.  LFTs are unremarkable and troponin within normal limits.  Urinalysis shows no signs of infection and patient appropriate for outpatient management.  We will start her on iron supplementation and refer to hematology for consideration of iron infusion.  She was counseled to return to the ED for new or worsening symptoms, patient agrees with plan.      FINAL  CLINICAL IMPRESSION(S) / ED DIAGNOSES   Final diagnoses:  Generalized weakness  Anemia, unspecified type     Rx / DC Orders   ED Discharge Orders          Ordered    ferrous sulfate 325 (65 FE) MG EC tablet  2 times daily        02/12/23 1721             Note:  This document was prepared using Dragon voice recognition software and may include unintentional dictation errors.   Chesley Noon, MD 02/12/23 [phone]

## 2023-02-12 NOTE — ED Notes (Signed)
Patient was asked to provide urine sample. She questioned "what is this for?" Made patient aware the doctor ordered a urinalysis to check for things like a possible UTI. Patient responded with "I don't have no UTI and I can't pee". MD made aware.

## 2023-02-12 NOTE — ED Notes (Addendum)
Patient states "I'm about ready to bust out of here and I'm known for that. I feel it coming." I questioned what exactly that means and patient said "That doctor needs to come in here and tell me what's going on and what we're doing. I'm diabetic and haven't eaten all day. Patient complaining of BP cuff, cuff adjusted and placed on lower left arm. Patient also reports GI cocktail did not work.  Patient also reports she did not take any home medications today, including BP meds. Patient did provide urine sample. MD made aware of patient's concerns.

## 2023-02-12 NOTE — ED Triage Notes (Signed)
Patient presents with weakness x 1 week; She was recently diagnosed with IDA and PCP wanted to set her up with Fe infusions but she said she "didn't listen" and never got the infusions

## 2023-02-27 ENCOUNTER — Inpatient Hospital Stay: Payer: 59

## 2023-02-27 ENCOUNTER — Inpatient Hospital Stay: Payer: 59 | Admitting: Internal Medicine

## 2023-03-02 ENCOUNTER — Inpatient Hospital Stay: Payer: 59 | Admitting: Internal Medicine

## 2023-03-02 ENCOUNTER — Inpatient Hospital Stay: Payer: 59

## 2023-03-09 ENCOUNTER — Inpatient Hospital Stay: Payer: 59 | Attending: Internal Medicine | Admitting: Oncology

## 2023-03-09 ENCOUNTER — Inpatient Hospital Stay: Payer: 59

## 2023-03-09 ENCOUNTER — Encounter: Payer: Self-pay | Admitting: Oncology

## 2023-03-16 ENCOUNTER — Inpatient Hospital Stay: Payer: 59

## 2023-03-16 ENCOUNTER — Inpatient Hospital Stay: Payer: 59 | Admitting: Oncology

## 2023-03-21 ENCOUNTER — Inpatient Hospital Stay: Payer: 59 | Attending: Internal Medicine | Admitting: Internal Medicine

## 2023-03-21 ENCOUNTER — Inpatient Hospital Stay: Payer: 59

## 2023-03-21 VITALS — BP 140/65 | HR 58 | Temp 97.9°F | Wt 225.0 lb

## 2023-03-21 DIAGNOSIS — D509 Iron deficiency anemia, unspecified: Secondary | ICD-10-CM | POA: Insufficient documentation

## 2023-03-21 DIAGNOSIS — D649 Anemia, unspecified: Secondary | ICD-10-CM

## 2023-03-21 DIAGNOSIS — R5383 Other fatigue: Secondary | ICD-10-CM | POA: Diagnosis not present

## 2023-03-21 LAB — CBC WITH DIFFERENTIAL/PLATELET
Abs Immature Granulocytes: 0.02 10*3/uL (ref 0.00–0.07)
Basophils Absolute: 0.1 10*3/uL (ref 0.0–0.1)
Basophils Relative: 1 %
Eosinophils Absolute: 0.1 10*3/uL (ref 0.0–0.5)
Eosinophils Relative: 2 %
HCT: 34.1 % — ABNORMAL LOW (ref 36.0–46.0)
Hemoglobin: 10.4 g/dL — ABNORMAL LOW (ref 12.0–15.0)
Immature Granulocytes: 0 %
Lymphocytes Relative: 26 %
Lymphs Abs: 1.7 10*3/uL (ref 0.7–4.0)
MCH: 25.3 pg — ABNORMAL LOW (ref 26.0–34.0)
MCHC: 30.5 g/dL (ref 30.0–36.0)
MCV: 83 fL (ref 80.0–100.0)
Monocytes Absolute: 0.5 10*3/uL (ref 0.1–1.0)
Monocytes Relative: 7 %
Neutro Abs: 4.3 10*3/uL (ref 1.7–7.7)
Neutrophils Relative %: 64 %
Platelets: 251 10*3/uL (ref 150–400)
RBC: 4.11 MIL/uL (ref 3.87–5.11)
RDW: 18.9 % — ABNORMAL HIGH (ref 11.5–15.5)
WBC: 6.7 10*3/uL (ref 4.0–10.5)
nRBC: 0 % (ref 0.0–0.2)

## 2023-03-21 LAB — IRON AND TIBC
Iron: 44 ug/dL (ref 28–170)
Saturation Ratios: 10 % — ABNORMAL LOW (ref 10.4–31.8)
TIBC: 447 ug/dL (ref 250–450)
UIBC: 403 ug/dL

## 2023-03-21 LAB — FERRITIN: Ferritin: 11 ng/mL (ref 11–307)

## 2023-03-21 LAB — VITAMIN B12: Vitamin B-12: 232 pg/mL (ref 180–914)

## 2023-03-21 LAB — FOLATE: Folate: 6.5 ng/mL (ref >5.9)

## 2023-03-21 NOTE — Progress Notes (Signed)
[facility]  [phone] [phone]  ID: [name] OB: Feb 24, 1961  MR#: [id number]  WNU#:[id number]  Patient Care Team: Hillery Aldo, MD as PCP - General (Family Medicine) Rickard Patience, MD as Consulting Physician (Oncology) Michaelyn Barter, MD as Consulting Physician (Oncology)  REFERRING PROVIDER: Dr. Larinda Buttery  REASON FOR REFERRAL: IDA  HPI: Treyanna Toren is a 62 y.o. female with past medical history of IDA, diabetes, hypertension, chronic back pain, anxiety was referred to hematology for management of IDA.  Patient has chronic anemia.  Reports she was recently seen by primary and was found to have very low iron levels.  She was advised to have iron infusions but she did not schedule.  She presented to ED in July 2024 with generalized weakness.  This was attributed to anemia and was discharged and advised to follow-up with hematology.  She reports significant amount of fatigue.  Tells me that she spends a great portion of her day sleeping.  Denies any bleeding in urine or stool.  Occasionally uses NSAIDs.  Never had colonoscopy and endoscopy and refused any intervention for future as well.   REVIEW OF SYSTEMS:   ROS  As per HPI. Otherwise, a complete review of systems is negative.  PAST MEDICAL HISTORY: Past Medical History:  Diagnosis Date   Anemia    not currently under treatment   Anxiety    Asthma    during allergy season   Diabetes mellitus without complication (HCC)    Hypertension    Vertigo     PAST SURGICAL HISTORY: Past Surgical History:  Procedure Laterality Date   CARPAL TUNNEL RELEASE Right 2015   CHOLECYSTECTOMY     KNEE ARTHROSCOPY Left 05/17/2018   Procedure: ARTHROSCOPY KNEE WITH LATERAL RELEASE, PARTIAL SYNOVECTOMY;  Surgeon: Kennedy Bucker, MD;  Location: [facility];  Service: Orthopedics;  Laterality: Left;   KNEE ARTHROSCOPY WITH LATERAL RELEASE Right 12/28/2017   Procedure: KNEE ARTHROSCOPY WITH LATERAL RELEASE;   Surgeon: Kennedy Bucker, MD;  Location: [facility];  Service: Orthopedics;  Laterality: Right;   TIBIAL TUBERCLERPLASTY Bilateral 06/01/2017   Procedure: TIBIAL TUBERCLE SPUR EXCISION;  Surgeon: Kennedy Bucker, MD;  Location: [facility];  Service: Orthopedics;  Laterality: Bilateral;    FAMILY HISTORY: Family History  Problem Relation Age of Onset   Seizures Sister    Seizures Brother     HEALTH MAINTENANCE: Social History   Tobacco Use   Smoking status: Never   Smokeless tobacco: Never  Vaping Use   Vaping status: Never Used  Substance Use Topics   Alcohol use: No   Drug use: No     Allergies  Allergen Reactions   Atorvastatin Swelling    Current Outpatient Medications  Medication Sig Dispense Refill   albuterol (VENTOLIN HFA) 108 (90 Base) MCG/ACT inhaler Inhale 2 puffs into the lungs every 6 (six) hours as needed.     aspirin 81 MG chewable tablet Chew 1 tablet (81 mg total) by mouth daily. 30 tablet 0   cloNIDine (CATAPRES) 0.1 MG tablet Take 0.1 mg by mouth 3 (three) times daily.     ferrous sulfate 325 (65 FE) MG EC tablet Take 1 tablet (325 mg total) by mouth 2 (two) times daily. 60 tablet 3   ibuprofen (ADVIL) 800 MG tablet Take 1 tablet (800 mg total) by mouth every 8 (eight) hours as needed. 30 tablet 0   insulin aspart (NOVOLOG) 100 UNIT/ML injection Inject 12 Units into the skin 3 (three) times daily with meals. (Patient  taking differently: Inject 20 Units into the skin 3 (three) times daily with meals.) 10 mL 11   insulin glargine-yfgn (SEMGLEE) 100 UNIT/ML injection Inject 1.2 mLs (120 Units total) into the skin at bedtime. (Patient taking differently: Inject 140 Units into the skin at bedtime.) 10 mL 11   mirtazapine (REMERON) 30 MG tablet Take 1 tablet (30 mg total) by mouth at bedtime. 30 tablet 1   ondansetron (ZOFRAN-ODT) 4 MG disintegrating tablet Take 1 tablet (4 mg total) by mouth every 8 (eight) hours as needed for nausea or vomiting. 12 tablet 0    oxyCODONE (OXY IR/ROXICODONE) 5 MG immediate release tablet Take 1 tablet (5 mg total) by mouth every 6 (six) hours as needed for severe pain or breakthrough pain (Nightime). 5 tablet 0   pantoprazole (PROTONIX) 40 MG tablet Take 1 tablet (40 mg total) by mouth daily at 12 noon. 30 tablet 1   pregabalin (LYRICA) 75 MG capsule Take 75 mg by mouth 3 (three) times daily.     vitamin B-12 (CYANOCOBALAMIN) 500 MCG tablet Take 1 tablet (500 mcg total) by mouth daily.     amLODipine (NORVASC) 5 MG tablet Take 1 tablet (5 mg total) by mouth daily. (Patient not taking: Reported on 03/21/2023)     ezetimibe (ZETIA) 10 MG tablet Take 1 tablet (10 mg total) by mouth daily. (Patient not taking: Reported on 03/21/2023)     lacosamide (VIMPAT) 50 MG TABS tablet Take 1 tablet (50 mg total) by mouth 2 (two) times daily. (Patient not taking: Reported on 03/21/2023) 60 tablet 0   levETIRAcetam (KEPPRA) 500 MG tablet Take 1 tablet (500 mg total) by mouth 2 (two) times daily. (Patient not taking: Reported on 03/21/2023) 60 tablet 0   metoCLOPramide (REGLAN) 10 MG tablet Take 1 tablet (10 mg total) by mouth every 8 (eight) hours as needed for nausea or vomiting. (Patient not taking: Reported on 03/21/2023) 30 tablet 0   prazosin (MINIPRESS) 1 MG capsule Take 1 capsule (1 mg total) by mouth at bedtime. (Patient not taking: Reported on 03/21/2023) 30 capsule 0   progesterone (PROMETRIUM) 200 MG capsule Take 200 mg by mouth at bedtime. (Patient not taking: Reported on 03/21/2023)     promethazine (PHENERGAN) 12.5 MG tablet Take 1 tablet (12.5 mg total) by mouth every 6 (six) hours as needed for nausea or vomiting. (Patient not taking: Reported on 03/21/2023) 20 tablet 0   No current facility-administered medications for this visit.    OBJECTIVE: Vitals:   03/21/23 1414  BP: (!) 140/65  Pulse: (!) 58  Temp: 97.9 F (36.6 C)  SpO2: 100%     Body mass index is 41.15 kg/m.      General: Well-developed, well-nourished, no acute  distress. Eyes: Pink conjunctiva, anicteric sclera. HEENT: Normocephalic, moist mucous membranes, clear oropharnyx. Lungs: Clear to auscultation bilaterally. Heart: Regular rate and rhythm. No rubs, murmurs, or gallops. Abdomen: Soft, nontender, nondistended. No organomegaly noted, normoactive bowel sounds. Musculoskeletal: No edema, cyanosis, or clubbing. Neuro: Alert, answering all questions appropriately. Cranial nerves grossly intact. Skin: No rashes or petechiae noted. Psych: Normal affect. Lymphatics: No cervical, calvicular, axillary or inguinal LAD.   LAB RESULTS:  Lab Results  Component Value Date   NA 139 02/12/2023   K 4.0 02/12/2023   CL 103 02/12/2023   CO2 26 02/12/2023   GLUCOSE 208 (H) 02/12/2023   BUN 7 02/12/2023   CREATININE 0.63 02/12/2023   CALCIUM 9.0 02/12/2023   PROT 7.6 02/12/2023  ALBUMIN 3.9 02/12/2023   AST 22 02/12/2023   ALT 19 02/12/2023   ALKPHOS 85 02/12/2023   BILITOT 0.6 02/12/2023   GFRNONAA >60 02/12/2023   GFRAA >60 03/10/2020    Lab Results  Component Value Date   WBC 6.7 03/21/2023   NEUTROABS 4.3 03/21/2023   HGB 10.4 (L) 03/21/2023   HCT 34.1 (L) 03/21/2023   MCV 83.0 03/21/2023   PLT 251 03/21/2023    Lab Results  Component Value Date   TIBC 325 02/24/2022   TIBC 360 07/09/2021   TIBC 398 07/22/2020   FERRITIN 13 07/09/2021   FERRITIN 21 07/18/2020   FERRITIN 24 07/17/2020   IRONPCTSAT 6 (L) 02/24/2022   IRONPCTSAT 5 (L) 07/09/2021   IRONPCTSAT 7 (L) 07/22/2020     STUDIES: No results found.  ASSESSMENT AND PLAN:   Daziya Cereceres is a 62 y.o. female with pmh of IDA, diabetes, hypertension, chronic back pain, anxiety was referred to hematology for management of IDA.  # Iron deficiency anemia -Chronic.  Of unknown etiology.  Cannot tolerate oral iron due to constipation. -CBC from 02/12/2023 showed hemoglobin of 9.3, MCV 78.  Will check iron level, B12 and folate. -Discussed about IV Venofer 300 mg weekly x 3  doses.  Occasional side effects such as nausea and back pain was discussed.  Rare but potential risk of anaphylactic reaction was also discussed. -I discussed about GI referral for endoscopy and colonoscopy to assess for any occult GI bleed.  Patient declined.  Orders Placed This Encounter  Procedures   CBC with Differential/Platelet   Iron and TIBC   Ferritin   Vitamin B12   Folate   CBC with Differential/Platelet   Iron and TIBC   Ferritin   Celiac Disease Panel   RTC in 3 months for MD visit, labs, possible Venofer Patient expressed understanding and was in agreement with this plan. She also understands that She can call clinic at any time with any questions, concerns, or complaints.   I spent a total of 45 minutes reviewing chart data, face-to-face evaluation with the patient, counseling and coordination of care as detailed above.  Michaelyn Barter, MD   03/21/2023 3:59 PM

## 2023-03-21 NOTE — Progress Notes (Signed)
Patient is in pain in lower back, down to legs and in both arms, she rates her pain at about a 7.  Appetite has been off for couple of years now.

## 2023-03-23 ENCOUNTER — Inpatient Hospital Stay: Payer: 59

## 2023-03-23 VITALS — BP 158/71 | HR 52 | Temp 97.2°F | Resp 20

## 2023-03-23 DIAGNOSIS — D509 Iron deficiency anemia, unspecified: Secondary | ICD-10-CM

## 2023-03-23 MED ORDER — SODIUM CHLORIDE 0.9 % IV SOLN
Freq: Once | INTRAVENOUS | Status: AC
  Filled 2023-03-23: qty 250

## 2023-03-23 MED ORDER — SODIUM CHLORIDE 0.9 % IV SOLN
300.0000 mg | Freq: Once | INTRAVENOUS | Status: AC
  Administered 2023-03-23: 300 mg via INTRAVENOUS
  Filled 2023-03-23: qty 300

## 2023-03-30 ENCOUNTER — Inpatient Hospital Stay: Payer: 59

## 2023-03-30 VITALS — BP 178/102 | HR 76 | Temp 97.9°F | Resp 18

## 2023-03-30 DIAGNOSIS — D509 Iron deficiency anemia, unspecified: Secondary | ICD-10-CM | POA: Diagnosis not present

## 2023-03-30 MED ORDER — SODIUM CHLORIDE 0.9 % IV SOLN
300.0000 mg | Freq: Once | INTRAVENOUS | Status: AC
  Administered 2023-03-30: 300 mg via INTRAVENOUS
  Filled 2023-03-30: qty 10

## 2023-03-30 MED ORDER — SODIUM CHLORIDE 0.9 % IV SOLN
Freq: Once | INTRAVENOUS | Status: AC
  Filled 2023-03-30: qty 250

## 2023-03-30 NOTE — Progress Notes (Signed)
Patient's BP was noted to be high upon iron completion. Patient states that she forgot to take her BP med today, but that she will go straight home and take it. Patient states that her BP runs high, and is in the 200's at times. Patient will monitor her BP at home and will contact her Primary MD if it continues to run high. Patient denies headache, blurred vision or dizziness.

## 2023-04-06 ENCOUNTER — Inpatient Hospital Stay: Payer: 59

## 2023-04-14 ENCOUNTER — Inpatient Hospital Stay: Payer: 59

## 2023-04-19 ENCOUNTER — Ambulatory Visit: Payer: Self-pay

## 2023-04-19 ENCOUNTER — Inpatient Hospital Stay: Payer: 59 | Attending: Internal Medicine

## 2023-04-19 NOTE — Progress Notes (Signed)
1350: Manual B/P 230/112  Second nurse manual check 228/112 Pt declines any symptoms including but not limited to headaches, blurred vision, slurred speech, numbness outside of the ordinary, etc.  Pt states she is passed due for pain medication and reports 10 out of 10 pain in legs ( pt states this is WNL). Pt is taking home prescription pain pill at this time.  Per Dr. Shara Blazing Venofer and pt should follow up with PCP ASAP.  Pt educated. To call PCP today and schedule a visit to discuss B/P management. Pt reports she has been Non-complaint with the new b/p medication due to the way it "makes me feels" and would like to switch back to Lisinopril, pt educated in the Importance of taking medications AS prescribed, when to seek emergency care, monitor and to call PCP TODAY. Pt verbalizes understanding.  Pt stable at discharge.   1401: Per Dr. Donneta Romberg if pt is non-complaint with medications, ER should be offered. Called patient to inform her of MD recommendation, no answer. MD nurse aware and will follow up with pt.

## 2023-06-06 ENCOUNTER — Other Ambulatory Visit: Payer: Self-pay | Admitting: Student

## 2023-06-06 DIAGNOSIS — R200 Anesthesia of skin: Secondary | ICD-10-CM

## 2023-06-06 DIAGNOSIS — R609 Edema, unspecified: Secondary | ICD-10-CM

## 2023-06-06 DIAGNOSIS — Z86718 Personal history of other venous thrombosis and embolism: Secondary | ICD-10-CM

## 2023-06-06 DIAGNOSIS — R0989 Other specified symptoms and signs involving the circulatory and respiratory systems: Secondary | ICD-10-CM

## 2023-06-08 ENCOUNTER — Other Ambulatory Visit (INDEPENDENT_AMBULATORY_CARE_PROVIDER_SITE_OTHER): Payer: Self-pay | Admitting: Student

## 2023-06-08 DIAGNOSIS — Z86718 Personal history of other venous thrombosis and embolism: Secondary | ICD-10-CM

## 2023-06-08 DIAGNOSIS — R0989 Other specified symptoms and signs involving the circulatory and respiratory systems: Secondary | ICD-10-CM

## 2023-06-08 DIAGNOSIS — M7989 Other specified soft tissue disorders: Secondary | ICD-10-CM

## 2023-06-08 DIAGNOSIS — R200 Anesthesia of skin: Secondary | ICD-10-CM

## 2023-06-13 ENCOUNTER — Ambulatory Visit (INDEPENDENT_AMBULATORY_CARE_PROVIDER_SITE_OTHER): Payer: 59

## 2023-06-13 ENCOUNTER — Ambulatory Visit
Admission: RE | Admit: 2023-06-13 | Discharge: 2023-06-13 | Disposition: A | Discharge status: Home / Self Care | Payer: 59 | Source: Ambulatory Visit | Attending: Student | Admitting: Student

## 2023-06-13 DIAGNOSIS — R609 Edema, unspecified: Secondary | ICD-10-CM | POA: Insufficient documentation

## 2023-06-13 DIAGNOSIS — R200 Anesthesia of skin: Secondary | ICD-10-CM

## 2023-06-13 DIAGNOSIS — Z86718 Personal history of other venous thrombosis and embolism: Secondary | ICD-10-CM

## 2023-06-13 DIAGNOSIS — R0989 Other specified symptoms and signs involving the circulatory and respiratory systems: Secondary | ICD-10-CM | POA: Insufficient documentation

## 2023-06-13 DIAGNOSIS — M7989 Other specified soft tissue disorders: Secondary | ICD-10-CM

## 2023-06-15 LAB — VAS US ABI WITH/WO TBI
Left ABI: 1.19
Right ABI: 1.2

## 2023-06-22 ENCOUNTER — Inpatient Hospital Stay: Payer: 59

## 2023-06-22 ENCOUNTER — Inpatient Hospital Stay: Payer: 59 | Admitting: Internal Medicine

## 2023-07-26 ENCOUNTER — Other Ambulatory Visit: Payer: Self-pay | Admitting: Neurology

## 2023-07-26 DIAGNOSIS — G8929 Other chronic pain: Secondary | ICD-10-CM

## 2023-08-22 ENCOUNTER — Encounter: Payer: Self-pay | Admitting: Neurology

## 2023-08-22 ENCOUNTER — Encounter: Payer: Self-pay | Admitting: Internal Medicine

## 2023-08-25 ENCOUNTER — Other Ambulatory Visit: Payer: 59

## 2023-09-01 ENCOUNTER — Encounter: Payer: Self-pay | Admitting: Internal Medicine

## 2023-10-02 ENCOUNTER — Ambulatory Visit
Admission: RE | Admit: 2023-10-02 | Discharge: 2023-10-02 | Disposition: A | Discharge status: Home / Self Care | Payer: Medicaid Other | Source: Ambulatory Visit | Attending: Neurology | Admitting: Neurology

## 2023-10-02 ENCOUNTER — Encounter: Payer: Self-pay | Admitting: Internal Medicine

## 2023-10-02 DIAGNOSIS — G8929 Other chronic pain: Secondary | ICD-10-CM

## 2023-10-27 ENCOUNTER — Telehealth: Payer: Self-pay | Admitting: *Deleted

## 2023-10-27 NOTE — Telephone Encounter (Signed)
 When I looked at the forms it is was social  security disability and theses forms are for [facility]. I called the pt. And says that she needs her iron infusion from last times. She wants me to keep the form for maybe in the future
